# Patient Record
Sex: Female | Born: 1960
Health system: Southern US, Community
[De-identification: ages and names within clinical notes are randomized; demographics above are authoritative.]

## PROBLEM LIST (undated history)

## (undated) DIAGNOSIS — Z79810 Long term (current) use of selective estrogen receptor modulators (SERMs): Secondary | ICD-10-CM

## (undated) DIAGNOSIS — F32A Depression, unspecified: Secondary | ICD-10-CM

## (undated) DIAGNOSIS — D702 Other drug-induced agranulocytosis: Secondary | ICD-10-CM

## (undated) DIAGNOSIS — Z860100 Personal history of colon polyps, unspecified: Secondary | ICD-10-CM

## (undated) DIAGNOSIS — F419 Anxiety disorder, unspecified: Secondary | ICD-10-CM

## (undated) DIAGNOSIS — M722 Plantar fascial fibromatosis: Secondary | ICD-10-CM

## (undated) DIAGNOSIS — Z9221 Personal history of antineoplastic chemotherapy: Secondary | ICD-10-CM

## (undated) DIAGNOSIS — T8859XA Other complications of anesthesia, initial encounter: Secondary | ICD-10-CM

## (undated) DIAGNOSIS — R42 Dizziness and giddiness: Secondary | ICD-10-CM

## (undated) DIAGNOSIS — R05 Cough: Secondary | ICD-10-CM

## (undated) DIAGNOSIS — N189 Chronic kidney disease, unspecified: Secondary | ICD-10-CM

## (undated) DIAGNOSIS — C7949 Secondary malignant neoplasm of other parts of nervous system: Secondary | ICD-10-CM

## (undated) DIAGNOSIS — R059 Cough, unspecified: Secondary | ICD-10-CM

## (undated) DIAGNOSIS — K219 Gastro-esophageal reflux disease without esophagitis: Secondary | ICD-10-CM

## (undated) DIAGNOSIS — R351 Nocturia: Secondary | ICD-10-CM

## (undated) DIAGNOSIS — N951 Menopausal and female climacteric states: Secondary | ICD-10-CM

## (undated) DIAGNOSIS — G4700 Insomnia, unspecified: Secondary | ICD-10-CM

## (undated) DIAGNOSIS — R350 Frequency of micturition: Secondary | ICD-10-CM

## (undated) DIAGNOSIS — K589 Irritable bowel syndrome without diarrhea: Secondary | ICD-10-CM

## (undated) DIAGNOSIS — Z923 Personal history of irradiation: Secondary | ICD-10-CM

## (undated) DIAGNOSIS — C50919 Malignant neoplasm of unspecified site of unspecified female breast: Secondary | ICD-10-CM

## (undated) DIAGNOSIS — T4145XA Adverse effect of unspecified anesthetic, initial encounter: Secondary | ICD-10-CM

## (undated) DIAGNOSIS — F329 Major depressive disorder, single episode, unspecified: Secondary | ICD-10-CM

## (undated) DIAGNOSIS — Z8709 Personal history of other diseases of the respiratory system: Secondary | ICD-10-CM

## (undated) DIAGNOSIS — Z98811 Dental restoration status: Secondary | ICD-10-CM

## (undated) HISTORY — DX: Plantar fascial fibromatosis: M72.2

## (undated) HISTORY — DX: Malignant neoplasm of unspecified site of unspecified female breast: C50.919

## (undated) HISTORY — DX: Secondary malignant neoplasm of other parts of nervous system: C79.49

## (undated) HISTORY — DX: Other drug-induced agranulocytosis: D70.2

## (undated) HISTORY — DX: Menopausal and female climacteric states: N95.1

## (undated) HISTORY — PX: COLONOSCOPY: SHX174

## (undated) HISTORY — DX: Irritable bowel syndrome, unspecified: K58.9

---

## 1999-10-18 ENCOUNTER — Encounter: Admission: RE | Admit: 1999-10-18 | Discharge: 1999-10-18 | Payer: Self-pay | Admitting: Internal Medicine

## 1999-11-02 ENCOUNTER — Ambulatory Visit (HOSPITAL_COMMUNITY): Admission: RE | Admit: 1999-11-02 | Discharge: 1999-11-02 | Payer: Self-pay | Admitting: Family Medicine

## 1999-11-02 ENCOUNTER — Encounter: Payer: Self-pay | Admitting: Family Medicine

## 2000-02-01 ENCOUNTER — Encounter: Admission: RE | Admit: 2000-02-01 | Discharge: 2000-03-08 | Payer: Self-pay | Admitting: Family Medicine

## 2000-02-02 ENCOUNTER — Encounter: Admission: RE | Admit: 2000-02-02 | Discharge: 2000-02-02 | Payer: Self-pay | Admitting: Internal Medicine

## 2000-02-04 ENCOUNTER — Encounter: Payer: Self-pay | Admitting: Internal Medicine

## 2000-02-04 ENCOUNTER — Ambulatory Visit (HOSPITAL_COMMUNITY): Admission: RE | Admit: 2000-02-04 | Discharge: 2000-02-04 | Payer: Self-pay | Admitting: Internal Medicine

## 2000-02-14 ENCOUNTER — Encounter: Admission: RE | Admit: 2000-02-14 | Discharge: 2000-02-14 | Payer: Self-pay | Admitting: Internal Medicine

## 2000-03-06 ENCOUNTER — Encounter: Admission: RE | Admit: 2000-03-06 | Discharge: 2000-03-06 | Payer: Self-pay | Admitting: Internal Medicine

## 2004-11-25 ENCOUNTER — Ambulatory Visit: Payer: Self-pay

## 2004-12-10 ENCOUNTER — Ambulatory Visit: Payer: Self-pay

## 2005-06-13 ENCOUNTER — Ambulatory Visit: Payer: Self-pay

## 2005-12-22 ENCOUNTER — Ambulatory Visit: Payer: Self-pay | Admitting: Obstetrics and Gynecology

## 2006-03-21 HISTORY — PX: BREAST LUMPECTOMY: SHX2

## 2007-03-13 ENCOUNTER — Ambulatory Visit: Payer: Self-pay

## 2007-03-26 ENCOUNTER — Ambulatory Visit: Payer: Self-pay

## 2007-04-25 ENCOUNTER — Ambulatory Visit: Payer: Self-pay | Admitting: Surgery

## 2007-05-04 ENCOUNTER — Ambulatory Visit: Payer: Self-pay | Admitting: Surgery

## 2007-05-11 ENCOUNTER — Ambulatory Visit: Payer: Self-pay | Admitting: Surgery

## 2008-03-18 ENCOUNTER — Ambulatory Visit: Payer: Self-pay

## 2008-10-08 ENCOUNTER — Emergency Department (HOSPITAL_COMMUNITY): Admission: EM | Admit: 2008-10-08 | Discharge: 2008-10-08 | Payer: Self-pay | Admitting: Emergency Medicine

## 2009-12-01 ENCOUNTER — Ambulatory Visit: Payer: Self-pay | Admitting: Sports Medicine

## 2009-12-01 DIAGNOSIS — M2170 Unequal limb length (acquired), unspecified site: Secondary | ICD-10-CM | POA: Insufficient documentation

## 2009-12-01 DIAGNOSIS — M722 Plantar fascial fibromatosis: Secondary | ICD-10-CM | POA: Insufficient documentation

## 2009-12-01 DIAGNOSIS — M79609 Pain in unspecified limb: Secondary | ICD-10-CM | POA: Insufficient documentation

## 2010-04-20 NOTE — Assessment & Plan Note (Signed)
Summary: LEFT FOOT PAIN IN HEEL AND MID FOOT x 5 WEEKS   Vital Signs:  Patient profile:   50 year old female Height:      63 inches Weight:      150 pounds BMI:     26.67 BP sitting:   146 / 88   History of Present Illness: 5 weeks of left heel pain that radiates to 2-3 metatarsals.  Gradual onset, worse first in the morning.  No pain at rest and does not wake her from sleep.  Ibuprofen  ~800mg  helps as well.  No numbness or weakness.  No known injury; did resume a walking program prior to pain.    Allergies (verified): No Known Drug Allergies  Physical Exam  General:  Well-developed,well-nourished,in no acute distress; alert,appropriate and cooperative throughout examination Msk:  Left Leg and Foot - Left leg 1.5cm longer - Pes planus with external rotation of left foot - Prominant navicular vs right - Tender medial aspect left heel that worsens with extension of great toe - 3rd MT TTP - Distal n/v intact - Strength grossly 5/5  Gait: slightly antalgic with moderate pronation and external rotation of left foot that improved with orthotics   Impression & Recommendations:  Problem # 1:  UNEQUAL LEG LENGTH (ICD-736.81) Assessment New   Suspect unequal length is the trigger for foot pain and plantar fasciitis. Approx 1.5cm increase in left leg length corrected with temporary orthotics that were fitted and adjusted in the office today.  Plan to return in 4 weeks for custom orthotics.   Orders: Foot Orthosis ( Arch Strap/Heel Cup) [phone]) Sports Insoles [phone])  Problem # 2:  FOOT PAIN, LEFT (ICD-729.5) Assessment: New  2/2 unequal leg lenth.  Orthotics per above.  Home exercise program and arch band.  Ice and as needed NSAIDS advised.   Orders: Foot Orthosis ( Arch Strap/Heel Cup) [phone]) Sports Insoles [phone])  Problem # 3:  PLANTAR FASCIITIS, LEFT (ICD-728.71) Assessment: New  Plan per foot pain above.   Orders: Foot Orthosis ( Arch Strap/Heel Cup)  [phone]) Sports Insoles [phone])

## 2011-01-18 ENCOUNTER — Encounter: Payer: Self-pay | Admitting: Internal Medicine

## 2011-01-18 ENCOUNTER — Ambulatory Visit (INDEPENDENT_AMBULATORY_CARE_PROVIDER_SITE_OTHER): Payer: 59 | Admitting: Internal Medicine

## 2011-01-18 DIAGNOSIS — N920 Excessive and frequent menstruation with regular cycle: Secondary | ICD-10-CM

## 2011-01-18 DIAGNOSIS — M722 Plantar fascial fibromatosis: Secondary | ICD-10-CM | POA: Insufficient documentation

## 2011-01-18 DIAGNOSIS — K589 Irritable bowel syndrome without diarrhea: Secondary | ICD-10-CM | POA: Insufficient documentation

## 2011-01-18 DIAGNOSIS — K2100 Gastro-esophageal reflux disease with esophagitis, without bleeding: Secondary | ICD-10-CM

## 2011-01-18 DIAGNOSIS — Z13220 Encounter for screening for lipoid disorders: Secondary | ICD-10-CM

## 2011-01-18 DIAGNOSIS — R635 Abnormal weight gain: Secondary | ICD-10-CM

## 2011-01-18 DIAGNOSIS — Z1239 Encounter for other screening for malignant neoplasm of breast: Secondary | ICD-10-CM

## 2011-01-18 LAB — COMPREHENSIVE METABOLIC PANEL
AST: 20 U/L (ref 0–37)
Albumin: 4.1 g/dL (ref 3.5–5.2)
BUN: 14 mg/dL (ref 6–23)
Calcium: 9.6 mg/dL (ref 8.4–10.5)
Chloride: 102 mEq/L (ref 96–112)
Potassium: 4 mEq/L (ref 3.5–5.1)
Total Protein: 7.6 g/dL (ref 6.0–8.3)

## 2011-01-18 LAB — CBC WITH DIFFERENTIAL/PLATELET
Basophils Absolute: 0 10*3/uL (ref 0.0–0.1)
Basophils Relative: 0.5 % (ref 0.0–3.0)
Eosinophils Absolute: 0.1 10*3/uL (ref 0.0–0.7)
Lymphocytes Relative: 24.3 % (ref 12.0–46.0)
MCHC: 34 g/dL (ref 30.0–36.0)
Neutrophils Relative %: 69.3 % (ref 43.0–77.0)
Platelets: 305 10*3/uL (ref 150.0–400.0)
RBC: 4.48 Mil/uL (ref 3.87–5.11)
RDW: 12.4 % (ref 11.5–14.6)

## 2011-01-18 LAB — LDL CHOLESTEROL, DIRECT: Direct LDL: 140.6 mg/dL

## 2011-01-18 MED ORDER — HYOSCYAMINE SULFATE 0.125 MG SL SUBL: 0.1250 mg | SUBLINGUAL_TABLET | SUBLINGUAL | Status: AC | PRN

## 2011-01-18 MED ORDER — ESOMEPRAZOLE MAGNESIUM 40 MG PO CPDR: 40.0000 mg | DELAYED_RELEASE_CAPSULE | Freq: Every day | ORAL | Status: DC

## 2011-01-18 NOTE — Progress Notes (Signed)
  Subjective:    Patient ID: Mary [name], female    DOB: [birth year], 50 y.o.   MRN: [id number]  HPI  50 yo history of plantar fasciitis presents with heavy periods or the last 3 to 4 years and new onset hot flashes,  Periods accompaneid by a lot of cramping. .  Also having new onset reflux with regurgitation, has been taking pepcid which helps.    Throat burning.  Pain under left breast and left breast with dull ache that comes and goes for the past 2 months.   Last mammogram at [facility] 2010. In 2009 had a breast  biopsy of the left breast which was normal.    Saw Dr. Mechele Collin years ago for a colonoscopy during diagnosis of IBS.  No unintentional weight loss.Plantar fasciitis treated by Dr. Charlsie Merles last year in  Sept with pain that resolved in May attributed to bunions .  Her IBS is controlled with immodium.   Past Medical History  Diagnosis Date  . Plantar fasciitis   . Irritable bowel syndrome    No current outpatient prescriptions on file prior to visit.    Review of Systems  Constitutional: Negative for fever, chills and unexpected weight change.  HENT: Negative for hearing loss, ear pain, nosebleeds, congestion, sore throat, facial swelling, rhinorrhea, sneezing, mouth sores, trouble swallowing, neck pain, neck stiffness, voice change, postnasal drip, sinus pressure, tinnitus and ear discharge.   Eyes: Negative for pain, discharge, redness and visual disturbance.  Respiratory: Negative for cough, chest tightness, shortness of breath, wheezing and stridor.   Cardiovascular: Negative for chest pain, palpitations and leg swelling.  Musculoskeletal: Negative for myalgias and arthralgias.  Skin: Negative for color change and rash.  Neurological: Negative for dizziness, weakness, light-headedness and headaches.  Hematological: Negative for adenopathy.       Objective:   Physical Exam  Constitutional: She is oriented to person, place, and time. She appears well-developed and well-nourished.   HENT:  Mouth/Throat: Oropharynx is clear and moist.  Eyes: EOM are normal. Pupils are equal, round, and reactive to light. No scleral icterus.  Neck: Normal range of motion. Neck supple. No JVD present. No thyromegaly present.  Cardiovascular: Normal rate, regular rhythm, normal heart sounds and intact distal pulses.   Pulmonary/Chest: Effort normal and breath sounds normal.  Abdominal: Soft. Bowel sounds are normal. She exhibits no mass. There is no tenderness.  Musculoskeletal: Normal range of motion. She exhibits no edema.  Lymphadenopathy:    She has no cervical adenopathy.  Neurological: She is alert and oriented to person, place, and time.  Skin: Skin is warm and dry.  Psychiatric: She has a normal mood and affect.          Assessment & Plan:  Screening for cervical cancer:  Will return in 2013 for PAP  History of remote abnormalities with serial normals since then for at least 10 years GERD with dysphagia/regurgitation. Will start a PPI and antispasmodic (hyoscyamine).  If symptoms do not improve in 4 weeks, refer for modified barium swallow. Screening for breast cancer: mammogram is overdue and ordered today.

## 2011-01-19 ENCOUNTER — Encounter: Payer: Self-pay | Admitting: Internal Medicine

## 2011-01-28 ENCOUNTER — Other Ambulatory Visit (HOSPITAL_COMMUNITY): Payer: 59

## 2011-02-23 LAB — HM MAMMOGRAPHY: HM Mammogram: NORMAL

## 2011-03-16 ENCOUNTER — Ambulatory Visit: Payer: Self-pay | Admitting: Internal Medicine

## 2011-03-22 HISTORY — PX: MASTECTOMY: SHX3

## 2011-03-30 ENCOUNTER — Encounter: Payer: Self-pay | Admitting: Internal Medicine

## 2011-05-18 ENCOUNTER — Telehealth: Payer: Self-pay | Admitting: Internal Medicine

## 2011-05-18 NOTE — Telephone Encounter (Signed)
I talked to patient she made an appt for Friday.  She was advised to continue eating bland foods until then and discontinue the imodium because patient stated it wasn't working anyway.

## 2011-05-18 NOTE — Telephone Encounter (Signed)
[facility] Triage Call Report Triage Record Num: [id number] Operator: Chevis Pretty Patient Name: Mary [name] Call Date & Time: 05/18/2011 9:34:12AM Patient Phone: [phone] PCP: Duncan Dull Patient Gender: Female PCP Fax : [phone] Patient DOB: 02/15/1961 Practice Name: [facility] Reason for Call: LMP 05/14/11. Caller: [name]/Patient; PCP: Duncan Dull; CB#: [phone]; ; ; Call regarding Has Had Diarrhea and Immodium Not Helping; states onset 05/15/11 of very watery diarrhea, and immodium helped, but then the watery diarrhea restarted 05/17/11 PM. Afebrile. Per protocol, emergent symptoms denied; advised appt within 24 hours. No appts available in Epic; caller transferred to office nurse/Ashley for assistance. Protocol(s) Used: Diarrhea or Other Change in Bowel Habits Recommended Outcome per Protocol: See Provider within 24 hours Reason for Outcome: Diarrhea lasting longer than 24 hours AND not improving with home care Care Advice: ~ Maintain dietary recommendations for pre-existing conditions. Do not take aspirin, ibuprofen, ketoprofen, naproxen, etc., or other pain relieving medications until consulting with provider. ~ ~ Tell provider if you have traveled to an area where infections are common. ~ SYMPTOM / CONDITION MANAGEMENT ~ CAUTIONS Go to the ED if you have developed signs and symptoms of dehydration such as very dry mouth and tongue; increased pulse rate at rest; no urine output for 8 hours or more; increasing weakness or drowsiness, or lightheadedness when trying to sit upright or standing. ~ Diarrheal Care: - Drink 2-3 quarts (2-3 liters) per day of low sugar content fluids, including over the counter oral hydration solution, unless directed otherwise by provider. - If accompanied by vomiting, take the fluids in frequent small sips or suck on ice chips. - Eat easily digested foods (such as bananas, rice, applesauce, toast, cooked  cereals, soup, crackers, baked or boiled potato, or baked chicken or [place] without skin). - Do not eat high fiber, high fat, high sugar content foods, or highly seasoned foods. - Do not drink caffeinated or alcoholic beverages. - Avoid milk and milk products while having symptoms. As symptoms improve, gradually add back to diet. - Application of A&D ointment or witch hazel medicated pads may help anal irritation. - Antidiarrheal medications are usually unnecessary. If symptoms are severe, consider nonprescription antidiarrheal and anti-motility drugs as directed by label or a provider. Do not take if have high fever or bloody diarrhea. If pregnant, do not take any medications not approved by your provider. - Consult your provider for advice regarding continuing prescription medication. ~ 05/18/2011 9:58:53AM Page 1 of 1 [id number]

## 2011-05-20 ENCOUNTER — Ambulatory Visit (INDEPENDENT_AMBULATORY_CARE_PROVIDER_SITE_OTHER): Payer: 59 | Admitting: Internal Medicine

## 2011-05-20 ENCOUNTER — Other Ambulatory Visit: Payer: Self-pay | Admitting: Internal Medicine

## 2011-05-20 ENCOUNTER — Encounter: Payer: Self-pay | Admitting: Internal Medicine

## 2011-05-20 DIAGNOSIS — R197 Diarrhea, unspecified: Secondary | ICD-10-CM

## 2011-05-20 DIAGNOSIS — A09 Infectious gastroenteritis and colitis, unspecified: Secondary | ICD-10-CM

## 2011-05-20 LAB — CBC WITH DIFFERENTIAL/PLATELET
Basophils Absolute: 0 10*3/uL (ref 0.0–0.1)
Basophils Relative: 0.7 % (ref 0.0–3.0)
Eosinophils Absolute: 0.1 10*3/uL (ref 0.0–0.7)
Lymphocytes Relative: 25.9 % (ref 12.0–46.0)
MCHC: 34 g/dL (ref 30.0–36.0)
MCV: 94.8 fl (ref 78.0–100.0)
Monocytes Absolute: 0.4 10*3/uL (ref 0.1–1.0)
Neutrophils Relative %: 65.3 % (ref 43.0–77.0)
RBC: 4.33 Mil/uL (ref 3.87–5.11)
RDW: 12.6 % (ref 11.5–14.6)

## 2011-05-20 MED ORDER — ALPRAZOLAM 0.25 MG PO TABS: 0.2500 mg | ORAL_TABLET | Freq: Two times a day (BID) | ORAL | Status: AC | PRN

## 2011-05-20 MED ORDER — DIPHENOXYLATE-ATROPINE 2.5-0.025 MG/5ML PO LIQD: 5.0000 mL | Freq: Four times a day (QID) | ORAL | Status: AC | PRN

## 2011-05-20 NOTE — Patient Instructions (Signed)
Diet for Diarrhea, Adult Having frequent, runny stools (diarrhea) has many causes. Diarrhea may be caused or worsened by food or drink. Diarrhea may be relieved by changing your diet. IF YOU ARE NOT TOLERATING SOLID FOODS:  Drink enough water and fluids to keep your urine clear or pale yellow.     Avoid sugary drinks and sodas as well as milk-based beverages.     Avoid beverages containing caffeine and alcohol.     You may try rehydrating beverages. You can make your own by following this recipe:      tsp table salt.      tsp baking soda.     ? tsp salt substitute (potassium chloride).     1 tbs + 1 tsp sugar.     1 qt water.  As your stools become more solid, you can start eating solid foods. Add foods one at a time. If a certain food causes your diarrhea to get worse, avoid that food and try other foods. A low fiber, low-fat, and lactose-free diet is recommended. Small, frequent meals may be better tolerated.   Starches  Allowed:  White, [place], and pita breads, plain rolls, buns, bagels. Plain muffins, matzo. Soda, saltine, or graham crackers. Pretzels, melba toast, zwieback. Cooked cereals made with water: cornmeal, farina, cream cereals. Dry cereals: refined corn, wheat, rice. Potatoes prepared any way without skins, refined macaroni, spaghetti, noodles, refined rice.     Avoid:  Bread, rolls, or crackers made with whole wheat, multi-grains, rye, bran seeds, nuts, or coconut. Corn tortillas or taco shells. Cereals containing whole grains, multi-grains, bran, coconut, nuts, or raisins. Cooked or dry oatmeal. Coarse wheat cereals, granola. Cereals advertised as "high-fiber." Potato skins. Whole grain pasta, wild or brown rice. Popcorn. Sweet potatoes/yams. Sweet rolls, doughnuts, waffles, pancakes, sweet breads.  Vegetables  Allowed: Strained tomato and vegetable juices. Most well-cooked and canned vegetables without seeds. Fresh: Tender lettuce, cucumber without the skin, cabbage,  spinach, bean sprouts.     Avoid: Fresh, cooked, or canned: Artichokes, baked beans, beet greens, broccoli, Brussels sprouts, corn, kale, legumes, peas, sweet potatoes. Cooked: Green or red cabbage, spinach. Avoid large servings of any vegetables, because vegetables shrink when cooked, and they contain more fiber per serving than fresh vegetables.  Fruit  Allowed: All fruit juices except prune juice. Cooked or canned: Apricots, applesauce, cantaloupe, cherries, fruit cocktail, grapefruit, grapes, kiwi, mandarin oranges, peaches, pears, plums, watermelon. Fresh: Apples without skin, ripe banana, grapes, cantaloupe, cherries, grapefruit, peaches, oranges, plums. Keep servings limited to  cup or 1 piece.     Avoid: Fresh: Apple with skin, apricots, mango, pears, raspberries, strawberries. Prune juice, stewed or dried prunes. Dried fruits, raisins, dates. Large servings of all fresh fruits.  Meat and Meat Substitutes  Allowed: Ground or well-cooked tender beef, ham, veal, lamb, pork, or poultry. Eggs, plain cheese. Fish, oysters, shrimp, lobster, other seafoods. Liver, organ meats.     Avoid: Tough, fibrous meats with gristle. Peanut butter, smooth or chunky. Cheese, nuts, seeds, legumes, dried peas, beans, lentils.  Milk  Allowed: Yogurt, lactose-free milk, kefir, drinkable yogurt, buttermilk, soy milk.     Avoid: Milk, chocolate milk, beverages made with milk, such as milk shakes.  Soups  Allowed: Bouillon, broth, or soups made from allowed foods. Any strained soup.     Avoid: Soups made from vegetables that are not allowed, cream or milk-based soups.  Desserts and Sweets  Allowed: Sugar-free gelatin, sugar-free frozen ice pops made without sugar alcohol.  Avoid: Plain cakes and cookies, pie made with allowed fruit, pudding, custard, cream pie. Gelatin, fruit, ice, sherbet, frozen ice pops. Ice cream, ice milk without nuts. Plain hard candy, honey, jelly, molasses, syrup, sugar,  chocolate syrup, gumdrops, marshmallows.  Fats and Oils  Allowed: Avoid any fats and oils.     Avoid: Seeds, nuts, olives, avocados. Margarine, butter, cream, mayonnaise, salad oils, plain salad dressings made from allowed foods. Plain gravy, crisp bacon without rind.  Beverages  Allowed: Water, decaffeinated teas, oral rehydration solutions, sugar-free beverages.     Avoid: Fruit juices, caffeinated beverages (coffee, tea, soda or pop), alcohol, sports drinks, or lemon-lime soda or pop.  Condiments  Allowed: Ketchup, mustard, horseradish, vinegar, cream sauce, cheese sauce, cocoa powder. Spices in moderation: allspice, basil, bay leaves, celery powder or leaves, cinnamon, cumin powder, curry powder, ginger, mace, marjoram, onion or garlic powder, oregano, paprika, parsley flakes, ground pepper, rosemary, sage, savory, tarragon, thyme, turmeric.     Avoid: Coconut, honey.  Weight Monitoring: Weigh yourself every day. You should weigh yourself in the morning after you urinate and before you eat breakfast. Wear the same amount of clothing when you weigh yourself. Record your weight daily. Bring your recorded weights to your clinic visits. Tell your caregiver right away if you have gained 3 lb/1.4 kg or more in 1 day, 5 lb/2.3 kg in a week, or whatever amount you were told to report. SEEK IMMEDIATE MEDICAL CARE IF:    You are unable to keep fluids down.     You start to throw up (vomit) or diarrhea keeps coming back (persistent).     Abdominal pain develops, increases, or can be felt in one place (localizes).     You have an oral temperature above 102 F (38.9 C), not controlled by medicine.     Diarrhea contains blood or mucus.     You develop excessive weakness, dizziness, fainting, or extreme thirst.  MAKE SURE YOU:    Understand these instructions.     Will watch your condition.     Will get help right away if you are not doing well or get worse.  Document Released: 05/28/2003  Document Revised: 11/17/2010 Document Reviewed: 09/18/2008

## 2011-05-20 NOTE — Progress Notes (Signed)
Subjective:    Patient ID: Mary [name], female    DOB: [birth year], 51 y.o.   MRN: [id number]  HPI  Mary [name] is a 51 yr old white female with a history of IBS who presents with a 6 day history of diarrhea. 6 to 8 loose stools daily , mild nausea occasionally,  No vomiting or abdominal pian.  No blood in stools.  No recent travel to endemic areas or recent restaurant use.   Symptoms have transiently improved with dieatary restrictions but she has hafd recurrence since advancing her diet beyond clear liquids.  6 office coworkers were also reporting loose stools for the pat several days. 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  Past Medical History  Diagnosis Date  . Plantar fasciitis   . Irritable bowel syndrome    Current Outpatient Prescriptions on File Prior to Visit  Medication Sig Dispense Refill  . Ascorbic Acid (VITAMIN C) 100 MG tablet Take 100 mg by mouth daily.        Marland Kitchen b complex vitamins tablet Take 1 tablet by mouth daily.       . calcium carbonate (OS-CAL) 600 MG TABS Take 600 mg by mouth 2 (two) times daily with a meal.        . esomeprazole (NEXIUM) 40 MG capsule Take 1 capsule (40 mg total) by mouth daily.  30 capsule  1  . Multiple Vitamin (MULTIVITAMIN) tablet Take 1 tablet by mouth daily.        . vitamin B-12 (CYANOCOBALAMIN) 100 MCG tablet Take 50 mcg by mouth daily.         Review of Systems  Constitutional: Positive for fatigue and unexpected weight change. Negative for fever and chills.  HENT: Negative for hearing loss, ear pain, nosebleeds, congestion, sore throat, facial swelling, rhinorrhea, sneezing, mouth sores, trouble swallowing, neck pain, neck stiffness, voice change, postnasal drip, sinus pressure, tinnitus and ear discharge.   Eyes: Negative for pain, discharge, redness and visual disturbance.  Respiratory: Negative for cough, chest tightness, shortness of  breath, wheezing and stridor.   Cardiovascular: Negative for chest pain, palpitations and leg swelling.  Gastrointestinal: Positive for diarrhea. Negative for nausea, vomiting, abdominal pain, constipation, blood in stool, abdominal distention, anal bleeding and rectal pain.  Musculoskeletal: Negative for myalgias and arthralgias.  Skin: Negative for color change and rash.  Neurological: Negative for dizziness, weakness, light-headedness and headaches.  Hematological: Negative for adenopathy.       Objective:   Physical Exam  Constitutional: She is oriented to person, place, and time. She appears well-developed and well-nourished.  HENT:  Mouth/Throat: Oropharynx is clear and moist.  Eyes: EOM are normal. Pupils are equal, round, and reactive to light. No scleral icterus.  Neck: Normal range of motion. Neck supple. No JVD present. No thyromegaly present.  Cardiovascular: Normal rate, regular rhythm, normal heart sounds and intact distal pulses.   Pulmonary/Chest: Effort normal and breath sounds normal.  Abdominal: Soft. Bowel sounds are normal. She exhibits no mass. There is no tenderness.  Musculoskeletal: Normal range of motion. She exhibits no edema.  Lymphadenopathy:    She has no cervical adenopathy.  Neurological: She is alert and oriented to person, place, and time.  Skin: Skin is warm and dry.  Psychiatric: She has a normal mood and affect.      Assessment & Plan:   Diarrhea of presumed infectious origin I suspect her symptoms were  due to viral etiology, e.g., [place] virus, and are largely resolved.  However she is having a difficult time advancing her diet.  Will check stools for continued infection,  Recommend clear liquid diet for a few days and cautious advancement avoiding dairy and roughage.  Teacher, English as a foreign language given.  Electrolytes and CBC were all normal.     Updated Medication List Outpatient Encounter Prescriptions as of 05/20/2011  Medication Sig Dispense Refill    . Ascorbic Acid (VITAMIN C) 100 MG tablet Take 100 mg by mouth daily.        Marland Kitchen b complex vitamins tablet Take 1 tablet by mouth daily.       . calcium carbonate (OS-CAL) 600 MG TABS Take 600 mg by mouth 2 (two) times daily with a meal.        . esomeprazole (NEXIUM) 40 MG capsule Take 1 capsule (40 mg total) by mouth daily.  30 capsule  1  . Multiple Vitamin (MULTIVITAMIN) tablet Take 1 tablet by mouth daily.        . vitamin B-12 (CYANOCOBALAMIN) 100 MCG tablet Take 50 mcg by mouth daily.        Marland Kitchen ALPRAZolam (XANAX) 0.25 MG tablet Take 1 tablet (0.25 mg total) by mouth 2 (two) times daily as needed for anxiety.  60 tablet  2  . diphenoxylate-atropine (LOMOTIL) 2.5-0.025 MG/5ML liquid Take 5 mLs by mouth 4 (four) times daily as needed.  60 mL  0

## 2011-05-21 LAB — COMPLETE METABOLIC PANEL WITH GFR
ALT: 29 U/L (ref 0–35)
Albumin: 4 g/dL (ref 3.5–5.2)
CO2: 22 mEq/L (ref 19–32)
Calcium: 8.6 mg/dL (ref 8.4–10.5)
Chloride: 108 mEq/L (ref 96–112)
GFR, Est African American: >89 mL/min
GFR, Est Non African American: 84 mL/min
Glucose, Bld: 75 mg/dL (ref 70–99)
Potassium: 5.2 mEq/L (ref 3.5–5.3)
Sodium: 140 mEq/L (ref 135–145)
Total Bilirubin: 0.3 mg/dL (ref 0.3–1.2)
Total Protein: 6.8 g/dL (ref 6.0–8.3)

## 2011-05-22 ENCOUNTER — Encounter: Payer: Self-pay | Admitting: Internal Medicine

## 2011-05-22 DIAGNOSIS — R197 Diarrhea, unspecified: Secondary | ICD-10-CM | POA: Insufficient documentation

## 2011-05-22 NOTE — Assessment & Plan Note (Signed)
I suspect her symptoms were due to viral etiology, e.g., [place] virus, and are largely resolved.  However she is having a difficult time advancing her diet.  Will check stools for continued infection,  Recommend clear liquid diet for a few days and cautious advancement avoiding dairy and roughage.  Teacher, English as a foreign language given.  Electrolytes and CBC were all normal.

## 2011-05-24 LAB — OVA AND PARASITE SCREEN

## 2011-05-24 LAB — FECAL LACTOFERRIN, QUANT: Lactoferrin: NEGATIVE

## 2011-09-13 ENCOUNTER — Telehealth: Payer: Self-pay | Admitting: Internal Medicine

## 2011-09-13 NOTE — Telephone Encounter (Signed)
Have her start otc zyrtec at bedtime one daily.  Follow up in one week

## 2011-09-13 NOTE — Telephone Encounter (Signed)
Caller: Syan/Patient; PCP: Duncan Dull; CB#: [phone];  Call regarding Cough/Congestion; Onset 09/07/11, worsening in spite of Mucinex OTC.  LMP 08/25/11.  Dry cough, worse at night.  Afebrile/subjective.  Clear sputum and nasal secretions- cough interferes w/ sleep.  Emergent sx ruled out.  See in 24 hours per Cough protocol.  Caller agreed.  Home care for the interim.  No appointments available at this time.  Message to office for follow up per practice instructions.  Caller advised she will be contacted regarding appointment ASAP.

## 2011-09-13 NOTE — Telephone Encounter (Signed)
Patient notified, she made an appt for next week.

## 2011-09-14 ENCOUNTER — Ambulatory Visit: Payer: 59 | Admitting: Internal Medicine

## 2011-09-21 ENCOUNTER — Ambulatory Visit (INDEPENDENT_AMBULATORY_CARE_PROVIDER_SITE_OTHER): Payer: 59 | Admitting: Internal Medicine

## 2011-09-21 ENCOUNTER — Encounter: Payer: Self-pay | Admitting: Internal Medicine

## 2011-09-21 VITALS — BP 150/84 | HR 88 | Temp 98.8°F | Resp 16 | Wt 162.8 lb

## 2011-09-21 DIAGNOSIS — N6310 Unspecified lump in the right breast, unspecified quadrant: Secondary | ICD-10-CM

## 2011-09-21 DIAGNOSIS — J40 Bronchitis, not specified as acute or chronic: Secondary | ICD-10-CM

## 2011-09-21 DIAGNOSIS — N63 Unspecified lump in unspecified breast: Secondary | ICD-10-CM

## 2011-09-21 DIAGNOSIS — J069 Acute upper respiratory infection, unspecified: Secondary | ICD-10-CM

## 2011-09-21 DIAGNOSIS — R030 Elevated blood-pressure reading, without diagnosis of hypertension: Secondary | ICD-10-CM

## 2011-09-21 MED ORDER — PREDNISONE (PAK) 10 MG PO TABS: ORAL_TABLET | ORAL | Status: AC

## 2011-09-21 MED ORDER — AZITHROMYCIN 500 MG PO TABS: 500.0000 mg | ORAL_TABLET | Freq: Every day | ORAL | Status: DC

## 2011-09-21 MED ORDER — GUAIFENESIN-CODEINE 100-10 MG/5ML PO SYRP: 10.0000 mL | ORAL_SOLUTION | Freq: Two times a day (BID) | ORAL | Status: AC | PRN

## 2011-09-21 NOTE — Patient Instructions (Addendum)
The post nasal drip is causing your sore throat.  Lavage your sinuses twice daly with Simply saline nasal spray.  Gargle with salt water often for the sore throat.  I am calling in Cheratussin cough syrup (has codeine) for the cough, azithrmoycin for 7 days,  And prednisone to manage the inflammation    I am setting you up for a mammogram and ultrasound of your right breast

## 2011-09-21 NOTE — Progress Notes (Signed)
Patient ID: Mary [name], female   DOB: Oct 19, 1960, 51 y.o.   MRN: [id number]  Patient Active Problem List  Diagnosis  . PLANTAR FASCIITIS, LEFT  . FOOT PAIN, LEFT  . UNEQUAL LEG LENGTH  . Plantar fasciitis  . Irritable bowel syndrome  . Diarrhea of presumed infectious origin  . Elevated blood-pressure reading without diagnosis of hypertension  . Mass of breast, right  . URI (upper respiratory infection)    Subjective:  CC:   Chief Complaint  Patient presents with  . Cough    x 2 weeks    HPI:   Mary [name] a 51 y.o. female who presents with 2 symptoms. 1) Cough x 2 weeks productive of clear sputum, accompanied by Post nasal drainage. Several sick contacts, as the office has been passing it around for weeks.  No fevers,  No aches,  No headaches. No ear pain.   Clear nasal dischare.  Taking zyrtec daily and robitussin.  The cough is  waking her and husband up at night.   Other issue is right breast thickening. For the past 3 weeks she had noted a persistent mass to the medial side of the nipple accompanied by nipple starting to invert,  Breast is tender, including the nipple without discharge.  Left breast is not tender.    Past Medical History  Diagnosis Date  . Plantar fasciitis   . Irritable bowel syndrome     Past Surgical History  Procedure Date  . Breast surgery          The following portions of the patient's history were reviewed and updated as appropriate: Allergies, current medications, and problem list.    Review of Systems:   12 Pt  review of systems was negative except those addressed in the HPI,     History   Social History  . Marital Status: Married    Spouse Name: N/A    Number of Children: N/A  . Years of Education: N/A   Occupational History  . Not on file.   Social History Main Topics  . Smoking status: Former Smoker    Quit date: 01/18/1991  . Smokeless tobacco: Never Used  . Alcohol Use: 3.0 oz/week    5 Glasses of  wine per week  . Drug Use: No  . Sexually Active: Not on file   Other Topics Concern  . Not on file   Social History Narrative  . No narrative on file    Objective:  BP 150/84  Pulse 88  Temp 98.8 F (37.1 C) (Oral)  Resp 16  Wt 162 lb 12 oz (73.823 kg)  SpO2 97%  LMP 08/26/2011  General appearance: alert, cooperative and appears stated age Ears: normal TM's and external ear canals both ears Throat: lips, mucosa, and tongue normal; teeth and gums normal Neck: no adenopathy, no carotid bruit, supple, symmetrical, trachea midline and thyroid not enlarged, symmetric, no tenderness/mass/nodules Back: symmetric, no curvature. ROM normal. No CVA tenderness. Lungs: clear to auscultation bilaterally Heart: regular rate and rhythm, S1, S2 normal, no murmur, click, rub or gallop Breast: left breast with palpable mass medial to nippple, nipple inversion ( early) noted  Skin: Skin color, texture, turgor normal. No rashes or lesions Lymph nodes: Cervical, supraclavicular, and axillary nodes normal.  Assessment and Plan:  Elevated blood-pressure reading without diagnosis of hypertension likely secondary to anxiety re breast mass, and recent use of decongestants.  Will recheck in 3 months.   Mass of breast, right Found by patient  3 weeks ago,.  Appreciated one exam today with nipple flattening .  Last Mammogram Dec 2012 at [place].  Diagnostic and ultrasound ordered for September 27 2011   URI (upper respiratory infection) This URI is most likely viral given the persistence of mild HEENT symptoms.  I have explained that in viral URIS, an antibiotic will not help the symptoms and will increase the risk of developing diarrhea.,  Continue oral and nasal decongestants,  Ibuprofen 400 mg and tylenol 650 mq 8 hrs for aches and pains, p  And will add cheratussin with codeine and prednisone for cough  And add a round of abx only if symptoms worsen to include fevers, facial pain, purulent  sputum./drainage.     Updated Medication List Outpatient Encounter Prescriptions as of 09/21/2011  Medication Sig Dispense Refill  . Ascorbic Acid (VITAMIN C) 100 MG tablet Take 100 mg by mouth daily.        Marland Kitchen b complex vitamins tablet Take 1 tablet by mouth daily.       . calcium carbonate (OS-CAL) 600 MG TABS Take 600 mg by mouth 2 (two) times daily with a meal.        . esomeprazole (NEXIUM) 40 MG capsule Take 1 capsule (40 mg total) by mouth daily.  30 capsule  1  . Multiple Vitamin (MULTIVITAMIN) tablet Take 1 tablet by mouth daily.        . vitamin B-12 (CYANOCOBALAMIN) 100 MCG tablet Take 50 mcg by mouth daily.        Marland Kitchen azithromycin (ZITHROMAX) 500 MG tablet Take 1 tablet (500 mg total) by mouth daily.  7 tablet  0  . guaiFENesin-codeine (ROBITUSSIN AC) 100-10 MG/5ML syrup Take 10 mLs by mouth 2 (two) times daily as needed for cough.  240 mL  0  . predniSONE (STERAPRED UNI-PAK) 10 MG tablet .6 tablets on Day 1 , then reduce by 1 tablet daily until gone  21 tablet  0     Orders Placed This Encounter  Procedures  . MM Digital Diagnostic Unilat R  . HM MAMMOGRAPHY  . HM PAP SMEAR  . HM COLONOSCOPY    No Follow-up on file.

## 2011-09-23 DIAGNOSIS — I10 Essential (primary) hypertension: Secondary | ICD-10-CM | POA: Insufficient documentation

## 2011-09-23 DIAGNOSIS — J069 Acute upper respiratory infection, unspecified: Secondary | ICD-10-CM | POA: Insufficient documentation

## 2011-09-23 DIAGNOSIS — N6310 Unspecified lump in the right breast, unspecified quadrant: Secondary | ICD-10-CM | POA: Insufficient documentation

## 2011-09-23 NOTE — Assessment & Plan Note (Signed)
Found by patient 3 weeks ago,.  Appreciated one exam today with nipple flattening .  Last Mammogram Dec 2012 at [place].  Diagnostic and ultrasound ordered for September 27 2011

## 2011-09-23 NOTE — Assessment & Plan Note (Signed)
This URI is most likely viral given the persistence of mild HEENT symptoms.  I have explained that in viral URIS, an antibiotic will not help the symptoms and will increase the risk of developing diarrhea.,  Continue oral and nasal decongestants,  Ibuprofen 400 mg and tylenol 650 mq 8 hrs for aches and pains, p  And will add cheratussin with codeine and prednisone for cough  And add a round of abx only if symptoms worsen to include fevers, facial pain, purulent sputum./drainage.

## 2011-09-23 NOTE — Assessment & Plan Note (Addendum)
likely secondary to anxiety re breast mass, and recent use of decongestants.  Will recheck in 3 months.

## 2011-09-27 ENCOUNTER — Ambulatory Visit: Payer: Self-pay | Admitting: Internal Medicine

## 2011-09-28 ENCOUNTER — Telehealth: Payer: Self-pay | Admitting: Internal Medicine

## 2011-09-28 NOTE — Telephone Encounter (Signed)
Left voicemail on mobile number re Mammogram and ultrasound of right breast were normal.  Patient offered diuretic , instructed to call back if requested.

## 2011-09-29 ENCOUNTER — Telehealth: Payer: Self-pay | Admitting: Internal Medicine

## 2011-09-29 NOTE — Telephone Encounter (Signed)
Spironolactone 25 mg one tablet daily in the morning prn fluid retention. #30 2 refills

## 2011-09-29 NOTE — Telephone Encounter (Signed)
Patient called back and stated she would like the diuretic you mentioned when you called her with the mammo results.  Please advise.

## 2011-09-30 MED ORDER — SPIRONOLACTONE 25 MG PO TABS: 25.0000 mg | ORAL_TABLET | Freq: Every day | ORAL | Status: DC

## 2011-09-30 NOTE — Telephone Encounter (Signed)
Rx called in 

## 2011-10-11 ENCOUNTER — Encounter: Payer: Self-pay | Admitting: Internal Medicine

## 2011-10-17 ENCOUNTER — Other Ambulatory Visit: Payer: Self-pay | Admitting: Internal Medicine

## 2011-10-20 DIAGNOSIS — C50919 Malignant neoplasm of unspecified site of unspecified female breast: Secondary | ICD-10-CM

## 2011-10-20 HISTORY — DX: Malignant neoplasm of unspecified site of unspecified female breast: C50.919

## 2011-11-04 ENCOUNTER — Telehealth: Payer: Self-pay | Admitting: Internal Medicine

## 2011-11-04 DIAGNOSIS — N6453 Retraction of nipple: Secondary | ICD-10-CM

## 2011-11-04 DIAGNOSIS — N644 Mastodynia: Secondary | ICD-10-CM

## 2011-11-04 NOTE — Telephone Encounter (Signed)
Patient called and stated she is still having trouble with her right breast, her mammogram was normal and all other tests she had have came back normal.  She wanted to know if you could send her to a breast specialist because she feels something is just not right.  Please advise.

## 2011-11-04 NOTE — Telephone Encounter (Signed)
Patient notified

## 2011-11-04 NOTE — Telephone Encounter (Signed)
Referral to Breast Clinic ordered.

## 2011-11-08 ENCOUNTER — Other Ambulatory Visit: Payer: Self-pay | Admitting: Internal Medicine

## 2011-11-08 DIAGNOSIS — N6459 Other signs and symptoms in breast: Secondary | ICD-10-CM

## 2011-11-14 ENCOUNTER — Ambulatory Visit
Admission: RE | Admit: 2011-11-14 | Discharge: 2011-11-14 | Disposition: A | Discharge status: Home / Self Care | Payer: 59 | Source: Ambulatory Visit | Attending: Internal Medicine | Admitting: Internal Medicine

## 2011-11-14 ENCOUNTER — Other Ambulatory Visit: Payer: Self-pay | Admitting: Internal Medicine

## 2011-11-14 ENCOUNTER — Telehealth: Payer: Self-pay | Admitting: Internal Medicine

## 2011-11-14 ENCOUNTER — Ambulatory Visit (HOSPITAL_COMMUNITY): Payer: 59

## 2011-11-14 DIAGNOSIS — N6459 Other signs and symptoms in breast: Secondary | ICD-10-CM

## 2011-11-14 NOTE — Telephone Encounter (Signed)
Dr. Deboraha Sprang from [facility] in [place] called and stated they did an ultrasound and mammogram on patient today and she did find a highly suspicious mass in the right breast.  She stated she did a needle biopsy and should have the results back tomorrow.

## 2011-11-14 NOTE — Telephone Encounter (Signed)
Called patient to check on status,  She is returning to Dr. Deboraha Sprang at the [facility]  for results of right breast biopsy tomorrow at 4 PM.  She has alprazolam for mgmt of anxiety and advised to take ibuprofen tonight.

## 2011-11-15 ENCOUNTER — Ambulatory Visit
Admission: RE | Admit: 2011-11-15 | Discharge: 2011-11-15 | Disposition: A | Discharge status: Home / Self Care | Payer: 59 | Source: Ambulatory Visit | Attending: Internal Medicine | Admitting: Internal Medicine

## 2011-11-15 ENCOUNTER — Other Ambulatory Visit: Payer: Self-pay | Admitting: Internal Medicine

## 2011-11-15 ENCOUNTER — Other Ambulatory Visit: Payer: Self-pay | Admitting: *Deleted

## 2011-11-15 DIAGNOSIS — C50911 Malignant neoplasm of unspecified site of right female breast: Secondary | ICD-10-CM

## 2011-11-15 DIAGNOSIS — N6489 Other specified disorders of breast: Secondary | ICD-10-CM

## 2011-11-15 DIAGNOSIS — N6459 Other signs and symptoms in breast: Secondary | ICD-10-CM

## 2011-11-15 NOTE — Telephone Encounter (Signed)
Opened in error

## 2011-11-16 ENCOUNTER — Other Ambulatory Visit: Payer: Self-pay | Admitting: Internal Medicine

## 2011-11-16 ENCOUNTER — Ambulatory Visit
Admission: RE | Admit: 2011-11-16 | Discharge: 2011-11-16 | Disposition: A | Discharge status: Home / Self Care | Payer: 59 | Source: Ambulatory Visit | Attending: Internal Medicine | Admitting: Internal Medicine

## 2011-11-16 DIAGNOSIS — N63 Unspecified lump in unspecified breast: Secondary | ICD-10-CM

## 2011-11-17 ENCOUNTER — Ambulatory Visit
Admission: RE | Admit: 2011-11-17 | Discharge: 2011-11-17 | Disposition: A | Discharge status: Home / Self Care | Payer: 59 | Source: Ambulatory Visit | Attending: Internal Medicine | Admitting: Internal Medicine

## 2011-11-17 ENCOUNTER — Telehealth: Payer: Self-pay | Admitting: *Deleted

## 2011-11-17 ENCOUNTER — Other Ambulatory Visit: Payer: Self-pay | Admitting: *Deleted

## 2011-11-17 ENCOUNTER — Encounter: Payer: Self-pay | Admitting: Internal Medicine

## 2011-11-17 DIAGNOSIS — C50919 Malignant neoplasm of unspecified site of unspecified female breast: Secondary | ICD-10-CM | POA: Insufficient documentation

## 2011-11-17 DIAGNOSIS — N63 Unspecified lump in unspecified breast: Secondary | ICD-10-CM

## 2011-11-17 DIAGNOSIS — C50419 Malignant neoplasm of upper-outer quadrant of unspecified female breast: Secondary | ICD-10-CM

## 2011-11-17 DIAGNOSIS — C50412 Malignant neoplasm of upper-outer quadrant of left female breast: Secondary | ICD-10-CM | POA: Insufficient documentation

## 2011-11-17 NOTE — Telephone Encounter (Signed)
Confirmed [facility] for 11/23/11 at 1230 .  Instructions and contact information given.

## 2011-11-18 ENCOUNTER — Ambulatory Visit (HOSPITAL_COMMUNITY)
Admission: RE | Admit: 2011-11-18 | Discharge: 2011-11-18 | Disposition: A | Discharge status: Home / Self Care | Payer: 59 | Source: Ambulatory Visit | Attending: Internal Medicine | Admitting: Internal Medicine

## 2011-11-18 DIAGNOSIS — C50911 Malignant neoplasm of unspecified site of right female breast: Secondary | ICD-10-CM

## 2011-11-18 DIAGNOSIS — K7689 Other specified diseases of liver: Secondary | ICD-10-CM | POA: Insufficient documentation

## 2011-11-18 DIAGNOSIS — C50419 Malignant neoplasm of upper-outer quadrant of unspecified female breast: Secondary | ICD-10-CM | POA: Insufficient documentation

## 2011-11-18 DIAGNOSIS — D0590 Unspecified type of carcinoma in situ of unspecified breast: Secondary | ICD-10-CM | POA: Insufficient documentation

## 2011-11-18 MED ORDER — GADOBENATE DIMEGLUMINE 529 MG/ML IV SOLN
15.0000 mL | Freq: Once | INTRAVENOUS | Status: AC | PRN
  Administered 2011-11-18: 15 mL via INTRAVENOUS

## 2011-11-23 ENCOUNTER — Ambulatory Visit (HOSPITAL_BASED_OUTPATIENT_CLINIC_OR_DEPARTMENT_OTHER): Payer: 59 | Admitting: Oncology

## 2011-11-23 ENCOUNTER — Encounter: Payer: Self-pay | Admitting: Oncology

## 2011-11-23 ENCOUNTER — Telehealth: Payer: Self-pay | Admitting: *Deleted

## 2011-11-23 ENCOUNTER — Ambulatory Visit (HOSPITAL_BASED_OUTPATIENT_CLINIC_OR_DEPARTMENT_OTHER): Payer: Commercial Managed Care - PPO | Admitting: General Surgery

## 2011-11-23 ENCOUNTER — Ambulatory Visit: Payer: 59 | Attending: General Surgery | Admitting: Physical Therapy

## 2011-11-23 ENCOUNTER — Encounter: Payer: Self-pay | Admitting: Specialist

## 2011-11-23 ENCOUNTER — Ambulatory Visit (HOSPITAL_BASED_OUTPATIENT_CLINIC_OR_DEPARTMENT_OTHER): Payer: 59 | Admitting: Lab

## 2011-11-23 ENCOUNTER — Ambulatory Visit: Payer: 59

## 2011-11-23 ENCOUNTER — Encounter (INDEPENDENT_AMBULATORY_CARE_PROVIDER_SITE_OTHER): Payer: Self-pay | Admitting: General Surgery

## 2011-11-23 ENCOUNTER — Other Ambulatory Visit (INDEPENDENT_AMBULATORY_CARE_PROVIDER_SITE_OTHER): Payer: Self-pay | Admitting: General Surgery

## 2011-11-23 ENCOUNTER — Ambulatory Visit
Admission: RE | Admit: 2011-11-23 | Discharge: 2011-11-23 | Disposition: A | Discharge status: Home / Self Care | Payer: 59 | Source: Ambulatory Visit | Attending: Radiation Oncology | Admitting: Radiation Oncology

## 2011-11-23 VITALS — BP 152/93 | HR 75 | Temp 97.9°F | Resp 20 | Ht 63.2 in | Wt 162.0 lb

## 2011-11-23 DIAGNOSIS — C50419 Malignant neoplasm of upper-outer quadrant of unspecified female breast: Secondary | ICD-10-CM

## 2011-11-23 DIAGNOSIS — M25619 Stiffness of unspecified shoulder, not elsewhere classified: Secondary | ICD-10-CM | POA: Insufficient documentation

## 2011-11-23 DIAGNOSIS — IMO0001 Reserved for inherently not codable concepts without codable children: Secondary | ICD-10-CM | POA: Insufficient documentation

## 2011-11-23 DIAGNOSIS — C50919 Malignant neoplasm of unspecified site of unspecified female breast: Secondary | ICD-10-CM | POA: Insufficient documentation

## 2011-11-23 LAB — CBC WITH DIFFERENTIAL/PLATELET
BASO%: 0.3 % (ref 0.0–2.0)
Basophils Absolute: 0 10*3/uL (ref 0.0–0.1)
EOS%: 1 % (ref 0.0–7.0)
HGB: 13.8 g/dL (ref 11.6–15.9)
MCH: 31.9 pg (ref 25.1–34.0)
MCHC: 34.2 g/dL (ref 31.5–36.0)
MCV: 93.1 fL (ref 79.5–101.0)
MONO%: 6.6 % (ref 0.0–14.0)
RBC: 4.33 10*6/uL (ref 3.70–5.45)
RDW: 12.1 % (ref 11.2–14.5)
lymph#: 1.4 10*3/uL (ref 0.9–3.3)

## 2011-11-23 LAB — COMPREHENSIVE METABOLIC PANEL (CC13)
ALT: 19 U/L (ref 0–55)
AST: 16 U/L (ref 5–34)
Albumin: 3.7 g/dL (ref 3.5–5.0)
Alkaline Phosphatase: 90 U/L (ref 40–150)
BUN: 13 mg/dL (ref 7.0–26.0)
Potassium: 4.5 mEq/L (ref 3.5–5.1)

## 2011-11-23 MED ORDER — PROCHLORPERAZINE 25 MG RE SUPP: 25.0000 mg | Freq: Two times a day (BID) | RECTAL | Status: DC | PRN

## 2011-11-23 MED ORDER — LORAZEPAM 0.5 MG PO TABS: 0.5000 mg | ORAL_TABLET | Freq: Four times a day (QID) | ORAL | Status: DC | PRN

## 2011-11-23 MED ORDER — DEXAMETHASONE 4 MG PO TABS: ORAL_TABLET | ORAL | Status: DC

## 2011-11-23 MED ORDER — PROCHLORPERAZINE MALEATE 10 MG PO TABS: 10.0000 mg | ORAL_TABLET | Freq: Four times a day (QID) | ORAL | Status: DC | PRN

## 2011-11-23 MED ORDER — ONDANSETRON HCL 8 MG PO TABS: ORAL_TABLET | ORAL | Status: DC

## 2011-11-23 MED ORDER — LIDOCAINE-PRILOCAINE 2.5-2.5 % EX CREA: TOPICAL_CREAM | CUTANEOUS | Status: DC | PRN

## 2011-11-23 NOTE — Progress Notes (Signed)
Checked in new pt.  No financial concerns at this time. °

## 2011-11-23 NOTE — Patient Instructions (Signed)
You have been diagnosed with a locally advanced cancer in the right breast. You will ultimately need a right modified radical mastectomy.  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  You will be scheduled for Port-A-Cath insertion to help with your preoperative, neoadjuvant chemotherapy.  Ultimately, you will need a right modified radical mastectomy and some type of surgery on the left side which may be a lumpectomy or a mastectomy depending on what you decide. That decision will come later.  Dr. Jacinto Halim office will call you tomorrow to set up the Port-A-Cath surgery.

## 2011-11-23 NOTE — Progress Notes (Addendum)
Patient ID: Mary [name], female   DOB: January 25, 1961, 51 y.o.   MRN: [id number]  No chief complaint on file.   HPI Mary [name] is a 51 y.o. female.  She was referred by Dr. Cain Saupe at the breast center of [facility] for evaluation and management of bilateral breast cancer.  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 She states that she has noticed a lump in her right breast since June of this year. She states that she had mammograms and [facility] which did not show anything other than fluid. She has not had any drainage from her breast. She does notice the right breast is somewhat smaller than the left now and she has noted nipple retraction on the right.  Ultimately she had imaging studies at the breast center [facility]. There is a large 8-10 cm mass centrally and superiorly in the right breast. Mammograms of the left breast showed calcifications in a focal area in the upper outer quadrant.  Image guided biopsy of the right breast shows mixed invasive and noninvasive disease. This may be a lobular cancer. ER 95%, PR 53%, Ki-67 85%, HER-2-negative. 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  MRI on 11/18/2011 shows abnormal right axillary lymph nodes, large, complex mass in the right breast in the upper central mass, nipple retraction, and biopsy changes in the left breast upper outer quadrant at least 1.3 cm in size. It was also a small liver lesion thought to be a cyst.  Addendum (12/14/2011):   BRCA and other genetic tests are negative.  She is being seen in the [facility]  by me, Dr. Drue Second, and Dr. Chipper Herb.  Her past history is fairly unremarkable. She did have a left breast biopsy for fibroadenoma 5 years ago. She has irritable bowel syndrome and hypertension.  Family history reveals breast cancer in a paternal aunt,  ovarian cancer in a great aunt, pancreatic cancer in a paternal grandmother. HPI  Past Medical History  Diagnosis Date  . Plantar fasciitis   . Irritable bowel syndrome     Past Surgical History  Procedure Date  . Breast surgery   . Breast lumpectomy 2008    benign    Family History  Problem Relation Age of Onset  . Hypertension Mother   . Hypertension Maternal Grandmother   . Cancer Paternal Grandmother     pancreatic cancer    Social History History  Substance Use Topics  . Smoking status: Former Smoker    Quit date: 01/18/1991  . Smokeless tobacco: Never Used  . Alcohol Use: 3.0 oz/week    5 Glasses of wine per week    No Known Allergies  Current Outpatient Prescriptions  Medication Sig Dispense Refill  . ALPRAZolam (XANAX) 0.25 MG tablet Take 0.25 mg by mouth 3 (three) times daily as needed.      . Ascorbic Acid (VITAMIN C) 100 MG tablet Take 100 mg by mouth daily.        Marland Kitchen azithromycin (ZITHROMAX) 500 MG tablet Take 1 tablet (500 mg total) by mouth daily.  7 tablet  0  . b complex vitamins tablet Take 1 tablet by mouth daily.       . calcium carbonate (OS-CAL) 600 MG TABS Take 600 mg by mouth 2 (two) times daily with a meal.        . Multiple Vitamin (MULTIVITAMIN) tablet Take  1 tablet by mouth daily.        Marland Kitchen NEXIUM 40 MG capsule TAKE 1 CAPSULE BY MOUTH DAILY.  30 capsule  6  . spironolactone (ALDACTONE) 25 MG tablet Take 1 tablet (25 mg total) by mouth daily.  30 tablet  2  . vitamin B-12 (CYANOCOBALAMIN) 100 MCG tablet Take 50 mcg by mouth daily.          Review of Systems Review of Systems  Constitutional: Negative for fever, chills and unexpected weight change.  HENT: Negative for hearing loss, congestion, sore throat, trouble swallowing and voice change.   Eyes: Negative for visual disturbance.  Respiratory: Negative for cough and wheezing.   Cardiovascular: Negative for chest pain, palpitations and leg swelling.  Gastrointestinal: Negative for  nausea, vomiting, abdominal pain, diarrhea, constipation, blood in stool, abdominal distention and anal bleeding.  Genitourinary: Negative for hematuria, vaginal bleeding and difficulty urinating.  Musculoskeletal: Negative for arthralgias.  Skin: Negative for rash and wound.  Neurological: Negative for seizures, syncope and headaches.  Hematological: Negative for adenopathy. Does not bruise/bleed easily.  Psychiatric/Behavioral: Negative for confusion.    Last menstrual period 10/24/2011.  Physical Exam Physical Exam  Constitutional: She is oriented to person, place, and time. She appears well-developed and well-nourished. No distress.  HENT:  Head: Normocephalic and atraumatic.  Nose: Nose normal.  Mouth/Throat: No oropharyngeal exudate.  Eyes: Conjunctivae and EOM are normal. Pupils are equal, round, and reactive to light. Left eye exhibits no discharge. No scleral icterus.  Neck: Neck supple. No JVD present. No tracheal deviation present. No thyromegaly present.  Cardiovascular: Normal rate, regular rhythm, normal heart sounds and intact distal pulses.   No murmur heard. Pulmonary/Chest: Effort normal and breath sounds normal. No respiratory distress. She has no wheezes. She has no rales. She exhibits no tenderness.         The right breast is smaller than the left and there is nipple retraction. There is a large central mass in the right breast which is somewhat mobile, but not freely mobile. I don't feel any discrete lymph nodes in the right axilla. There is a biopsy site in the left breast and I do not feel a mass or axillary adenopathy.  Abdominal: Soft. Bowel sounds are normal. She exhibits no distension and no mass. There is no tenderness. There is no rebound and no guarding.  Musculoskeletal: She exhibits no edema and no tenderness.  Lymphadenopathy:    She has no cervical adenopathy.  Neurological: She is alert and oriented to person, place, and time. She exhibits normal  muscle tone. Coordination normal.  Skin: Skin is warm. No rash noted. She is not diaphoretic. No erythema. No pallor.  Psychiatric: She has a normal mood and affect. Her behavior is normal. Judgment and thought content normal.    Data Reviewed I have reviewed all of her imaging studies, pathology slides and breast diagnostic profile. I have coordinated her treatment plan and care with Dr. Drue Second and Dr. Chipper Herb.  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 Clinical stage T3,N1.  This central tumor partially filled her shrunken right breast, and it would be advisable to try to down stage this with neoadjuvant chemotherapy prior to right modified radical mastectomy.  High-grade DCIS with comedo necrosis, left breast, upper outer quadrant. Clinical stage Tis, N0. Options for treatment of the left breast include lumpectomy with sentinel load biopsy versus total mastectomy  and SLN biopsy.  ADDENDUM:  BRCA testing and other genetic tests are negative. (12/14/2011).    Plan    Neoadjuvant therapy will be initiated in the near future.  I will schedule for insertion of Port-A-Cath. I discussed the indications, details, techniques, and numerous risks of this procedure with the patient. She understands these issues. Her questions are answered. She agrees with this plan  Ultimately she will need a right modified radical mastectomy and simultaneous definitive surgery on the left, which may take the form of lumpectomy with sentinel node biopsy, or mastectomy and sentinel node. She has not decided about this yet.  We talked about plastic surgical referral, and we decided that we would refer her for plastic surgery consultation during her neoadjuvant chemotherapy. She is contemplating whether to have bilateral mastectomies and delayed reconstruction. She knows that she is not a good candidate for  immediate reconstruction on the right because she will need radiation therapy on the right.  I will follow her during her neoadjuvant chemotherapy, which should take 20 weeks or so.    She will  return to see me 3 months following insertion of Port-A-Cath, which should be midway through her chemotherapy.  We will plan end of treatment MRI  Dr. Drue Second will be referring her for genetic counseling(see addendum above) and a PET scan.       Angelia Mould. Derrell Lolling, M.D., [facility] General and Minimally invasive Surgery Breast and Colorectal Surgery Office:   [phone] Pager:   [phone]  11/23/2011, 4:02 PM

## 2011-11-23 NOTE — Telephone Encounter (Signed)
Continue  Made patient injections for:  12-09-2011  12-23-2011  01-06-2012  01-20-2012

## 2011-11-23 NOTE — Patient Instructions (Addendum)
We discussed neoadjuvant chemotherapy   PET scan  Echocardiogram Chemo class Port placement

## 2011-11-23 NOTE — Progress Notes (Signed)
Ronita Hargreaves [id number] 1960/05/23 51 y.o. 11/23/2011 4:11 PM  CC  Duncan Dull, MD [address] [place] [address] Dr. Claud Kelp Dr. 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   STAGE:   Cancer of upper-outer quadrant of female breast   Primary site: Breast (Right)   Staging method: AJCC 7th Edition   Clinical: Stage IIB (T3, N0, cM0)   Summary: Stage IIB (T3, N0, cM0)  REFERRING PHYSICIAN: Dr. Claud Kelp  HISTORY OF PRESENT ILLNESS:  Shenna Brissette is a 51 y.o. female.  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 Patient states that she had noted some nipple flattening. She went on to have mammogram performed at [facility] on 09/27/2011. Her breasts were dense there were no sonographic or mammographic abnormalities noted in the right breast. She went on to get a second opinion at the breast Center. At that time she was noted to have nipple retraction along the right breast is shrunken breast and a mass measuring 7-8 cm between 10 and 1:00 position 2 cm from the nipple. On 11/14/2011 patient went on to have an ultrasound-guided core needle biopsy. She was found to have invasive and in situ mammary carcinoma estrogen receptor +95% progesterone receptor +53% proliferation marker Ki-67 85% and HER-2/neu negative. Immunohistochemical staining for the crit when necessary showed that patient in fact had a lobular primary. On on 11/18/2011 patient had MRI performed that showed multicentric abnormal right upper/central irregular masslike enhancement within the right breast with abnormal  right axillary adenopathy as well as is thickening nipple retraction and skin edema. Patient also has been noted to have stereotactic core biopsy of the left breast calcifications within the upper-outer quadrant that is positive for high-grade DCIS. Left MRI showed only biopsy changes. Patient's case was discussed at the multidisciplinary breast conference. Her pathology and radiology was reviewed extensively. Recommendations made will based on NCCN guidelines for stage II invasive carcinoma.Patient Is without any complaints. Today she was seen in the multidisciplinary clinic by Dr. Chipper Herb and Dr. Claud Kelp   Past Medical History: Past Medical History  Diagnosis Date  . Plantar fasciitis   . Irritable bowel syndrome     Past Surgical History: Past Surgical History  Procedure Date  . Breast surgery   . Breast lumpectomy 2008    benign    Family History: Family History  Problem Relation Age of Onset  . Hypertension Mother   . Hypertension Maternal Grandmother   . Cancer Paternal Grandmother     pancreatic cancer    Social History History  Substance Use Topics  . Smoking status: Former Smoker    Quit date: 01/18/1991  . Smokeless tobacco: Never Used  . Alcohol Use: 3.0 oz/week    5 Glasses of wine per week    Allergies: No Known Allergies  Current Medications: Current Outpatient Prescriptions  Medication Sig Dispense Refill  . ALPRAZolam (XANAX) 0.25 MG tablet Take 0.25 mg by mouth 3 (three) times daily as needed.      . Ascorbic Acid (VITAMIN C) 100 MG tablet Take 100 mg by mouth daily.        Marland Kitchen b  complex vitamins tablet Take 1 tablet by mouth daily.       . calcium carbonate (OS-CAL) 600 MG TABS Take 600 mg by mouth 2 (two) times daily with a meal.        . Multiple Vitamin (MULTIVITAMIN) tablet Take 1 tablet by mouth daily.        Marland Kitchen NEXIUM 40 MG capsule TAKE 1 CAPSULE BY MOUTH DAILY.  30 capsule  6  . spironolactone (ALDACTONE) 25 MG tablet Take 1 tablet  (25 mg total) by mouth daily.  30 tablet  2  . vitamin B-12 (CYANOCOBALAMIN) 100 MCG tablet Take 50 mcg by mouth daily.        Marland Kitchen azithromycin (ZITHROMAX) 500 MG tablet Take 1 tablet (500 mg total) by mouth daily.  7 tablet  0    OB/GYN History:Menarche at age 60 she is premenopausal last menstrual period was on 11/22/2011 she is having regular periods. She is G0 P0 00  Fertility Discussion: No Desire for fertility preservation Prior History of Cancer:No prior history of malignancy  Health Maintenance:  Colonoscopy 10 years ago Bone Density No Last PAP smear 2 years ago  ECOG PERFORMANCE STATUS: 0 - Asymptomatic  Genetic Counseling/testing:Due to patient's early onset of breast cancer and by laterality as well as a paternal aunt having breast cancer in her 66s and maternal great-aunt with ovarian cancer in her 61s patient is recommended genetic testing and counseling  REVIEW OF SYSTEMS: Overall patient is doing well she denies any fevers chills night sweats headaches shortness of breath chest pains palpitations she has no myalgias or arthralgias she does have significant anxiety especially with the diagnosis of her disease. She has been having night sweats for the past 1 year he does have acid indigestion and she has history of IBS. She does notice some throbbing pain in her nipple in the right side. She's had some hot flashes for the last one year. She is otherwise no bleeding problems. No abnormal rashes no urinary problems no back pain joint pain or arthritis no headaches no seizures weakness forgetfulness or numbness or tingling. Remainder of the 14 point review of systems  PHYSICAL EXAMINATION: Blood pressure 152/93, pulse 75, temperature 97.9 F (36.6 C), resp. rate 20, height 5' 3.2" (1.605 m), weight 162 lb (73.483 kg), last menstrual period 10/24/2011.  Gen.: Patient is well-developed well-nourished female in no acute distress. HEENT exam EOMI PERRLA sclerae anicteric no  conjunctival pallor oral glucose is moist neck is supple no palpable cervical supraclavicular or axillary adenopathy lungs are clear to auscultation and percussion cardiovascular is regular rate rhythm no murmurs gallops or rubs abdomen is soft nontender nondistended bowel sounds are present no hepatosplenomegaly extremities no clubbing edema or cyanosis neuro patient's alert oriented otherwise nonfocal. Right breast no masses or nipple discharge. Left breast reveals a palpable mass with some nipple retraction no dimpling of the skin.    STUDIES/RESULTS: US Breast Right  11/14/2011  *RADIOLOGY REPORT*  Clinical Data:  The patient has noted a firm mass in the right upper outer quadrant associated with nipple retraction for several months.  DIGITAL DIAGNOSTIC RIGHT MAMMOGRAM WITH CAD AND RIGHT BREAST ULTRASOUND:  Comparison:  09/27/2011 and 03/16/2011 from [facility].  Findings:  The breast tissue is heterogeneously dense.  There is a "shrunken" appearance of the right breast parenchyma with retraction of the right nipple and thickening of the skin of the right nipple and areola.  Architectural distortion is noted in the right anterior  upper outer quadrant without defined mass. Mammographic images were processed with CAD.  On physical exam, there is a 7-8 cm firm mass between 10 and 1 o'clock in the right breast, 2 cm from the nipple. There is retraction of the right nipple.  Ultrasound is performed, showing intense shadowing between 11 and 1 o'clock, 2 cm from the right nipple.  It is very difficult to measure defined masses within this area of shadowing.  Sonography of the right axilla demonstrates no definitively abnormal nodes.  The appearance is suspicious for invasive lobular carcinoma. Ultrasound-guided core needle biopsy is recommended.  The procedure was discussed with the patient and she agreed with this plan.  IMPRESSION: Highly suspicious "shrunken" right breast parenchyma with  right nipple retraction in the right anterior breast between 10 and 1 o'clock.  Findings suggest invasive lobular carcinoma.  RECOMMENDATION: Ultrasound-guided core needle biopsy is recommended.  This will be performed and reported separately.  Report was telephoned to [facility] at Dr. Melina Schools office.  BI-RADS CATEGORY 5:  Highly suggestive of malignancy - appropriate action should be taken.   Original Report Authenticated By: Daryl Eastern, M.D.    Mr Breast Bilateral W Wo Contrast  11/22/2011  **ADDENDUM** CREATED: 11/22/2011 12:53:17  T2 hyperintense hepatic lesion is most likely a cyst, but further evaluation could be performed with ultrasound or liver mass protocol MRI with contrast for further evaluation, unless there is outside prior imaging as previously evaluated this finding.  There is no similar examination within this Health Care System.  Addended by:  Harrel Lemon, M.D. on 11/22/2011 12:53:17.  **END ADDENDUM** SIGNED BY: Harrel Lemon, M.D.   11/22/2011  *RADIOLOGY REPORT*  Clinical Data: Recently diagnosed left breast upper outer quadrant high-grade DCIS manifesting as mammographically detected calcifications.  Invasive mammary carcinoma and DCIS in the right breast diagnosed as a palpable mass 11/14/2011.  BILATERAL BREAST MRI WITH AND WITHOUT CONTRAST  Technique: Multiplanar, multisequence MR images of both breasts were obtained prior to and following the intravenous administration of 15ml of Multihance.  Three dimensional images were evaluated at the independent DynaCad workstation.  Comparison:  Prior exams  Findings: Multiple abnormal right axillary lymph nodes with cortical thickening are noted, largest 8 mm in short axis diameter. There is minimal borderline prominence of several high left axillary lymph nodes but no abnormal morphology or cortical thickening according to imaging criteria.  At the dome of the right hepatic lobe, there is a 1 cm T2 hyperintense lesion which does not  demonstrate internal enhancement allowing for technique.  There is right nipple retraction, diffuse right breast skin thickening, and skin and trabecular edema.  No internal mammary artery chain lymphadenopathy.  Post biopsy changes noted in the left upper outer quadrant with clip artifact.  No other abnormal T2- weighted hyperintensity is identified on either side.  On postcontrast imaging, there is a moderate background type parenchymal enhancement pattern bilaterally.  This may decrease the sensitivity for detection of malignancy.  Allowing for this, there is a dominant confluent area of irregular mass-like enhancement predominately in the right upper breast demonstrating areas of washin/washout type enhancement kinetics, measuring approximately overall 9.3 x 8 6.3 x 5.6 cm (craniocaudad by transverse by anteroposterior dimension).  This corresponds to the area of biopsy- proven malignancy.  In the left breast upper outer quadrant, there is post biopsy change and clip artifact associated area of adjacent irregular nodular enhancement demonstrating washin/washout type enhancement kinetics, measuring 1.3 x 1.1 x 0.8 cm.  This  does include the central biopsy cavity.  This corresponds to the area of biopsy- proven malignancy.  IMPRESSION: Multicentric abnormal right upper/central irregular mass-like enhancement, corresponding to the area of biopsy-proven breast cancer, with abnormal associated right axillary lymphadenopathy, right breast skin thickening, nipple retraction, and right breast skin/trabecular edema.  This suggests either secondary lymphedema or lymphangitic spread of tumor.  Left upper outer quadrant post biopsy change and irregular mass- like enhancement measuring 1.3 cm in maximal dimension, corresponding to the area of biopsy-proven DCIS.  The extent of disease could be underestimated given the background moderate enhancement type parenchymal pattern.  No other area of dominant abnormal enhancement in  either breast.  RECOMMENDATION: Treatment plan  BI-RADS CATEGORY 6:  Known biopsy-proven malignancy - appropriate action should be taken.  THREE-DIMENSIONAL MR IMAGE RENDERING ON INDEPENDENT WORKSTATION:  Three-dimensional MR images were rendered by post-processing of the original MR data on an independent workstation.  The three- dimensional MR images were interpreted, and findings were reported in the accompanying complete MRI report for this study.   Original Report Authenticated By: Harrel Lemon, M.D.    [place] Core Biopsy  11/15/2011  *RADIOLOGY REPORT*  Clinical Data:  Palpable mass in the right anterior upper outer quadrant.  "Shrunken" breast parenchyma in this location mammographically with intense shadowing noted sonographically.  ULTRASOUND GUIDED VACUUM ASSISTED CORE BIOPSY OF THE RIGHT BREAST  The patient and I discussed the procedure of ultrasound-guided biopsy, including benefits and alternatives.  We discussed the high likelihood of a successful procedure. We discussed the risks of the procedure including infection, bleeding, tissue injury, clip migration, and inadequate sampling.  Informed written consent was given.  Using sterile technique, 2% lidocaine, ultrasound guidance and a 12 gauge vacuum assisted needle, biopsy was performed of the palpable mass in the right anterior upper outer quadrant using a lateromedial approach.  At the conclusion of the procedure, a ribbon tissue marker clip was deployed into the biopsy cavity. Follow-up 2-view mammogram was performed and dictated separately.  IMPRESSION: Ultrasound-guided biopsy of a palpable mass in the right anterior upper outer quadrant.  No apparent complications.   Original Report Authenticated By: Daryl Eastern, M.D.    Mm Breast Stereo Biopsy Left  11/16/2011  *RADIOLOGY REPORT*  Clinical Data:  Suspicious calcifications in the left upper outer quadrant posteriorly.  STEREOTACTIC-GUIDED VACUUM ASSISTED BIOPSY OF THE LEFT BREAST  AND SPECIMEN RADIOGRAPH  The patient and I discussed the procedure of stereotactic-guided biopsy, including benefits and alternatives.  We discussed the high likelihood of a successful procedure. We discussed the risks of the procedure, including infection, bleeding, tissue injury, clip migration, and inadequate sampling.  Informed written consent was given.  Using sterile technique, 2% lidocaine, stereotactic guidance, and a 9 gauge vacuum assisted device, biopsy was performed of the calcifications in the left upper outer quadrant posteriorly using a craniocaudal approach.  Specimen radiograph was performed, showing numerous calcifications within multiple core specimens.  Specimens with calcifications are identified for pathology.  At the conclusion of the procedure, a top hat shaped tissue marker clip was deployed into the biopsy cavity.  Follow-up 2-view mammogram confirmed clip placement and removal of many of the calcifications.  IMPRESSION: Stereotactic-guided biopsy of calcifications in the left upper outer quadrant posteriorly.  No apparent complications.   Original Report Authenticated By: Daryl Eastern, M.D.    Mm Breast Surgical Specimen  11/16/2011  *RADIOLOGY REPORT*  Clinical Data:  Suspicious calcifications in the left upper outer quadrant posteriorly.  STEREOTACTIC-GUIDED  VACUUM ASSISTED BIOPSY OF THE LEFT BREAST AND SPECIMEN RADIOGRAPH  The patient and I discussed the procedure of stereotactic-guided biopsy, including benefits and alternatives.  We discussed the high likelihood of a successful procedure. We discussed the risks of the procedure, including infection, bleeding, tissue injury, clip migration, and inadequate sampling.  Informed written consent was given.  Using sterile technique, 2% lidocaine, stereotactic guidance, and a 9 gauge vacuum assisted device, biopsy was performed of the calcifications in the left upper outer quadrant posteriorly using a craniocaudal approach.  Specimen  radiograph was performed, showing numerous calcifications within multiple core specimens.  Specimens with calcifications are identified for pathology.  At the conclusion of the procedure, a top hat shaped tissue marker clip was deployed into the biopsy cavity.  Follow-up 2-view mammogram confirmed clip placement and removal of many of the calcifications.  IMPRESSION: Stereotactic-guided biopsy of calcifications in the left upper outer quadrant posteriorly.  No apparent complications.   Original Report Authenticated By: Daryl Eastern, M.D.    Mm Digital Diagnostic Unilat L  11/15/2011  *RADIOLOGY REPORT*  Clinical Data:  Invasive and in situ mammary carcinoma with lobular features was diagnosed in the right breast on 11/14/2011.  Left mammogram is obtained today to correlate with scheduled breast MRI on 11/18/2011.  DIGITAL DIAGNOSTIC LEFT MAMMOGRAM WITH CAD  Comparison:  03/16/2011 from [facility]  Findings:  The breast tissue is heterogeneously dense.  There are clustered pleomorphic microcalcifications in the left upper outer quadrant posteriorly.  The appearance is suspicious and biopsy is suggested.   No mass or nonsurgical distortion is identified. Mammographic images were processed with CAD.  IMPRESSION: Suspicious microcalcifications in the left upper outer quadrant posteriorly.  RECOMMENDATION: Stereotactic core needle biopsy is recommended.  This is scheduled for 11/16/2011.  BI-RADS CATEGORY 4:  Suspicious abnormality - biopsy should be considered.   Original Report Authenticated By: Daryl Eastern, M.D.    Mm Digital Diagnostic Unilat R  11/14/2011  *RADIOLOGY REPORT*  Clinical Data:  Ultrasound-guided core needle biopsy of the right anterior upper outer quadrant with clip placement.  DIGITAL DIAGNOSTIC RIGHT MAMMOGRAM  Comparison:  Previous exams.  Findings:  Films are performed following ultrasound guided biopsy of the right anterior upper outer quadrant, 2 cm from  the right nipple.  The InRad ribbon clip is appropriately positioned.  IMPRESSION: Appropriate clip placement following ultrasound-guided core needle biopsy of a suspicious mass in the right anterior upper outer quadrant.   Original Report Authenticated By: Daryl Eastern, M.D.    Mm Digital Diagnostic Unilat R  11/14/2011  *RADIOLOGY REPORT*  Clinical Data:  The patient has noted a firm mass in the right upper outer quadrant associated with nipple retraction for several months.  DIGITAL DIAGNOSTIC RIGHT MAMMOGRAM WITH CAD AND RIGHT BREAST ULTRASOUND:  Comparison:  09/27/2011 and 03/16/2011 from [facility].  Findings:  The breast tissue is heterogeneously dense.  There is a "shrunken" appearance of the right breast parenchyma with retraction of the right nipple and thickening of the skin of the right nipple and areola.  Architectural distortion is noted in the right anterior upper outer quadrant without defined mass. Mammographic images were processed with CAD.  On physical exam, there is a 7-8 cm firm mass between 10 and 1 o'clock in the right breast, 2 cm from the nipple. There is retraction of the right nipple.  Ultrasound is performed, showing intense shadowing between 11 and 1 o'clock, 2 cm from the right nipple.  It is very difficult to measure defined masses within this area of shadowing.  Sonography of the right axilla demonstrates no definitively abnormal nodes.  The appearance is suspicious for invasive lobular carcinoma. Ultrasound-guided core needle biopsy is recommended.  The procedure was discussed with the patient and she agreed with this plan.  IMPRESSION: Highly suspicious "shrunken" right breast parenchyma with right nipple retraction in the right anterior breast between 10 and 1 o'clock.  Findings suggest invasive lobular carcinoma.  RECOMMENDATION: Ultrasound-guided core needle biopsy is recommended.  This will be performed and reported separately.  Report was  telephoned to [facility] at Dr. Melina Schools office.  BI-RADS CATEGORY 5:  Highly suggestive of malignancy - appropriate action should be taken.   Original Report Authenticated By: Daryl Eastern, M.D.    Mm Radiologist Eval And Mgmt  11/17/2011  *RADIOLOGY REPORT*  Clinical Data: Stereotactic core biopsy left breast calcifications.  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  This is found to be concordant with imaging findings.  I discussed the results with the patient and her mother and answered their questions.  The biopsy site is clean without complications.  The patient has appointment for MRI breast on November 18, 2011 and has an appointment for multidisciplinary clinic next Wednesday.  IMPRESSION: Post biopsy evaluation of left breast as described.   Original Report Authenticated By: Sherian Rein, M.D.    Mm Radiologist Eval And Mgmt  11/15/2011  *RADIOLOGY REPORT*  ESTABLISHED PATIENT OFFICE VISIT - LEVEL II [phone])  Chief Complaint:  The patient returns for discussion of the pathologic findings after ultrasound-guided core needle biopsy of the right breast performed on 11/14/2011.  History:  The patient has noted a firm mass in the right subareolar region and right upper outer quadrant for several months with retraction of the right nipple.  Imaging evaluation demonstrated a suspicious shadowing mass in the right anterior upper outer quadrant. Ultrasound-guided core needle biopsy was performed.  Exam:  On physical examination, there is no sign of hematoma or infection at the biopsy site.  Pathology: Histologic evaluation demonstrates invasive and in situ mammary carcinoma with lobular features.  Assessment and Plan:  Results were discussed with the patient and her mother.  The patient requested consultation in [place].  She is scheduled to be seen in the [facility] at the regional cancer Center at [facility] on 11/23/2011.  Breast MRI is scheduled for 11/18/2011 at 4:45 p.m. questions were answered.  Educational materials were given.   Original Report Authenticated By: Daryl Eastern, M.D.      LABS:    Chemistry      Component Value Date/Time   NA 141 11/23/2011 1225   NA 140 05/20/2011 1204   K 4.5 11/23/2011 1225   K 5.2 05/20/2011 1204   CL 107 11/23/2011 1225   CL 108 05/20/2011 1204   CO2 23 11/23/2011 1225   CO2 22 05/20/2011 1204   BUN 13.0 11/23/2011 1225   BUN 10 05/20/2011 1204   CREATININE 0.9 11/23/2011 1225   CREATININE 0.82 05/20/2011 1204   CREATININE 0.9 01/18/2011 1202      Component Value Date/Time   CALCIUM 9.4 11/23/2011 1225   CALCIUM 8.6 05/20/2011 1204   ALKPHOS 90 11/23/2011 1225   ALKPHOS 80 05/20/2011 1204   AST 16 11/23/2011 1225   AST 21 05/20/2011 1204   ALT 19  11/23/2011 1225   ALT 29 05/20/2011 1204   BILITOT 0.30 11/23/2011 1225   BILITOT 0.3 05/20/2011 1204      Lab Results  Component Value Date   WBC 9.0 11/23/2011   HGB 13.8 11/23/2011   HCT 40.3 11/23/2011   MCV 93.1 11/23/2011   PLT 247 11/23/2011   PATHOLOGY: ADDITIONAL INFORMATION: The malignant cells show focal faint and granular membranous staining for E-Cadherin. Overall, the histologic features favor a lobular phenotype, in my opinion. (JBK:kh 11-24-11) Pecola Leisure MD Pathologist, Electronic Signature ( Signed 11/24/2011) PROGNOSTIC INDICATORS - ACIS Results IMMUNOHISTOCHEMICAL AND MORPHOMETRIC ANALYSIS BY THE AUTOMATED CELLULAR IMAGING SYSTEM (ACIS) This invasive carcinoma shows the following breast prognostic profile. Estrogen Receptor (Negative, <1%): 95%,POSITIVE, MODERATE STAINING INTENSITY Progesterone Receptor (Negative, <1%): 53%, POSITIVE, STRONG STAINING INTENSITY Proliferation Marker Ki67 by M IB-1 (Low<20%): 85% All controls stained appropriately Abigail Miyamoto MD Pathologist, Electronic Signature ( Signed 11/18/2011) CHROMOGENIC IN-SITU  HYBRIDIZATION Interpretation HER-2/NEU BY CISH - NO AMPLIFICATION OF HER-2 DETECTED. THE RATIO OF HER-2: CEP 17 SIGNALS WAS 1.21. Reference range: Ratio: HER2:CEP17 < 1.8 - gene amplification not observed 1 of 3 FINAL for Burbach, Maame [phone]) ADDITIONAL INFORMATION:(continued) Ratio: HER2:CEP 17 1.8-2.2 - equivocal result Ratio: HER2:CEP17 > 2.2 - gene amplification observed Pecola Leisure MD Pathologist, Electronic Signature ( Signed 11/18/2011) FINAL DIAGNOSIS Diagnosis Breast, right, needle core biopsy, mass, 11-12 o'clock, 2 cm / nipple - INVASIVE AND IN SITU MAMMARY CARCINOMA. - LYMPHOVASCULAR INVASION IS IDENTIFIED. - SEE COMMENT. Microscopic Comment The carcinoma has some lobular features and appears Grade 2. A breast prognostic profile will be performed and the results reported separately. The results were called to the [facility] on 11/15/2011. (JBK:caf 11/15/11) Pecola Leisure MD Pathologist, Electronic Signature (Case signed 11/15/2011) Specimen Gross and Clinical Information Specimen Comment Firm right breast mass with right nipple retraction In formalin 3:55, extracted <1 min Specimen(s) Obtained: Breast, right, needle core biopsy, mass, 11-12 o'clock, 2 cm / nipple Specimen Clinical Information Suspect invasive lobular carcinoma  ADDITIONAL INFORMATION: PROGNOSTIC INDICATORS - ACIS Results IMMUNOHISTOCHEMICAL AND MORPHOMETRIC ANALYSIS BY THE AUTOMATED CELLULAR IMAGING SYSTEM (ACIS) Estrogen Receptor (Negative, <1%): 81%, STRONG STAINING INTENSITY Progesterone Receptor (Negative, <1%): 74%, STRONG STAINING INTENSITY All controls stained appropriately Pecola Leisure MD Pathologist, Electronic Signature ( Signed 11/23/2011) FINAL DIAGNOSIS Diagnosis Breast, left, needle core biopsy, ~ 2 o'clock, 7 cm from nipple - 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 - SEE COMMENT. Microscopic Comment Focally, the  high grade ductal carcinoma in situ appears to be partially involving an area of adenosis. A quantitative estrogen receptor and quantitative progesterone receptor immunohistochemical profile will be performed and reported in an addendum. The findings are called to the [facility] on 11/17/11. Dr. Colonel Bald has seen this case in consultation with agreement. (RAH:caf 11/17/11) Zandra Abts MD Pathologist, Electronic Signature (Case signed 11/17/2011) 1 of ASSESSMENT    51 year old female with  #1 new diagnosis of invasive lobular carcinoma of the left breast diagnosed with needle core biopsy. Patient has extensive disease including involvement of the lymph node. She will need systemic therapy. Recommendation is neoadjuvant chemotherapy. I have discussed treatment with 5-FU epirubicin Cytoxan to be given dose dense followed by Taxol for 12 weeks neoadjuvant bleeding.  #2 because of patient's extent of disease she is recommended staging studies including getting a PET scan.  #3 have also recommended genetic counseling and testing to her oh early onset of breast cancer and history of breast cancers  in the family.  #4 We will also order echocardiogram that she is getting an anthracycline.  #6 risks and benefits of treatment were discussed with the patient and her family. They are in agreement to proceed with the recommended neoadjuvant therapy. Eventually patient will require a mastectomy with sentinel lymph node biopsy on the right side with lumpectomy and sentinel lymph node biopsy. However we would like to see what her genetic testing results are. Certainly if she is positive for BRCA1 or BRCA2 gene mutation she may require bilateral mastectomies with even bilateral salpingo-oophorectomies.      PLAN:    #1 patient will proceed with getting an echocardiogram as well as Port-A-Cath placed.  #2 my plan will be to see her back in 2 weeks' time to begin her chemotherapy. The patient  is planning on going away for her birthday I do think that that is fine. We will also get a PET scan scheduled.       Thank you so much for allowing me to participate in the care of [facility]. I will continue to follow up the patient with you and assist in her care.  All questions were answered. The patient knows to call the clinic with any problems, questions or concerns. We can certainly see the patient much sooner if necessary.  I spent 60 minutes counseling the patient face to face. The total time spent in the appointment was 60 minutes.  Drue Second, MD Medical/Oncology [facility] [phone] (beeper) [phone] (Office)  11/23/2011, 4:12 PM

## 2011-11-23 NOTE — Telephone Encounter (Signed)
chemotherapy on 9/19, 10/3, 10/17, 10/31  see kk on 9/19, 9/26  Sent michelle an email to set up patient treatment   Made patient appointment for pet scan and echo patient requested the same day for the scans 12-01-2011 pet scan first then echo at [facility]   Made patient apppointment for chemo class on 11-29-2011 at 5:00pm showed patient the chemo class room

## 2011-11-23 NOTE — Progress Notes (Signed)
I met Mrs. Mary [name] and her husband today in the multidisciplinary breast clinic.  She indicated that her distress level was a "10", saying that adjusting to this diagnosis is the biggest source of her distress.  I encouraged her to attend Breast Cancer Support Group; she did not want a referral to [facility] at this time.  Mrs. Mary [name] did inquire about the availability of counseling services.  I will follow-up with a phone call to check on her in the following weeks.

## 2011-11-23 NOTE — Progress Notes (Signed)
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 female who is seen today at the [facility] for evaluation of her invasive mammary carcinoma the right breast and DCIS of the left breast. The patient states that she had right breast nipple flattening and had mammography at [facility] on 09/27/2011. Her breast was dense and there wass no sonographic or mammographic abnormality appreciated within the right breast. She sought a second opinion at the [facility] Center where she was noted to have nipple retraction along the right breast with the appearance of a shrunken breast and a mass measuring approximate 7-8 cm between 10 and 1:00, 2 cm from the nipple.. She underwent ultrasound-guided core biopsy on 11/14/2011. She was found to have invasive and in situ mammary carcinoma which was ER positive at 95% and PR positive at 53% with a proliferation marker of 85%. There was no amplification of HER-2/neu. Further immunohistochemical testing (E cadherin) is being performed to see if she has a lobular primary. Breast MR on 01/18/2012 showed multicentric abnormal right upper/central irregular masslike enhancement within the right breast with abnormal right axillary adenopathy in addition to breast thickening, nipple retraction and skin edema. I should mention that she had a stereotactic core biopsy of left breast calcifications within the upper-outer quadrant diagnostic for high-grade DCIS. Left breast MR from 01/18/2012 showed biopsy changes along the upper-outer quadrant of left breast measuring 1.3 cm.  She seen today with Dr. Derrell Lolling and Dr. Welton Flakes.  PREVIOUS RADIATION THERAPY: No   PAST MEDICAL HISTORY:  has a past medical history of Plantar fasciitis and Irritable bowel syndrome.     PAST SURGICAL HISTORY:  Past Surgical History  Procedure Date  . Breast surgery   . Breast lumpectomy 2008    benign     FAMILY HISTORY: family history includes Cancer in her paternal grandmother and Hypertension in her maternal grandmother and mother.   SOCIAL HISTORY:  reports that she quit smoking about 20 years ago. She has never used smokeless tobacco. She reports that she drinks about 3 ounces of alcohol per week. She reports that she does not use illicit drugs. Married, no children. 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   ALLERGIES: Review of patient's allergies indicates no known allergies.   MEDICATIONS:  Current Outpatient Prescriptions  Medication Sig Dispense Refill  . ALPRAZolam (XANAX) 0.25 MG tablet Take 0.25 mg by mouth 3 (three) times daily as needed.      . Ascorbic Acid (VITAMIN C) 100 MG tablet Take 100 mg by mouth daily.        Marland Kitchen azithromycin (ZITHROMAX) 500 MG tablet Take 1 tablet (500 mg total) by mouth daily.  7 tablet  0  . b complex vitamins tablet Take 1 tablet by mouth daily.       . calcium carbonate (OS-CAL) 600 MG TABS Take 600 mg by mouth 2 (two) times daily with a meal.        . Multiple Vitamin (MULTIVITAMIN)  tablet Take 1 tablet by mouth daily.        Marland Kitchen NEXIUM 40 MG capsule TAKE 1 CAPSULE BY MOUTH DAILY.  30 capsule  6  . spironolactone (ALDACTONE) 25 MG tablet Take 1 tablet (25 mg total) by mouth daily.  30 tablet  2  . vitamin B-12 (CYANOCOBALAMIN) 100 MCG tablet Take 50 mcg by mouth daily.           REVIEW OF SYSTEMS:  Pertinent items are noted in HPI.    PHYSICAL EXAM: Alert and oriented 51 year old white female appearing her stated age. Wt Readings from Last 3 Encounters:  11/23/11 162 lb  (73.483 kg)  09/21/11 162 lb 12 oz (73.823 kg)  05/20/11 159 lb 8 oz (72.349 kg)   Temp Readings from Last 3 Encounters:  11/23/11 97.9 F (36.6 C)   09/21/11 98.8 F (37.1 C) Oral  05/20/11 98.5 F (36.9 C) Oral   BP Readings from Last 3 Encounters:  11/23/11 152/93  09/21/11 150/84  05/20/11 124/68   Pulse Readings from Last 3 Encounters:  11/23/11 75  09/21/11 88  05/20/11 82    Head and neck examination: Grossly unremarkable. Nodes: Without palpable cervical, supraclavicular, or axillary lymphadenopathy. Chest: Lungs clear. Heart: Regular in rhythm. Back: Without spinal or CVA tenderness. Breasts: The right breast is slightly shrunken and there is nipple retraction. There is a palpable mass centered over the upper central breast measuring at least 8-10 cm. This does not have an inflammatory appearance. On inspection of the left breast there is a bruise at approximately 12:00, no masses are appreciated. There is a scar from her previous benign breast biopsy. Abdomen without hepatomegaly. Extremities: Without edema. Neurologic examination: Grossly nonfocal.    LABORATORY DATA:  Lab Results  Component Value Date   WBC 9.0 11/23/2011   HGB 13.8 11/23/2011   HCT 40.3 11/23/2011   MCV 93.1 11/23/2011   PLT 247 11/23/2011   Lab Results  Component Value Date   NA 141 11/23/2011   K 4.5 11/23/2011   CL 107 11/23/2011   CO2 23 11/23/2011   Lab Results  Component Value Date   ALT 19 11/23/2011   AST 16 11/23/2011   ALKPHOS 90 11/23/2011   BILITOT 0.30 11/23/2011      IMPRESSION: Clinical stage IIIA  (T3, N1, M0) invasive mammary carcinoma the right breast, stage 0 (Tis N0 M0) DCIS of the left breast. We agree that she needs staging with a PET scan based on her locally advanced disease on the right. She has been seen by Dr. Derrell Lolling and Dr. Welton Flakes, and I believe that she is being scheduled for neoadjuvant chemotherapy followed by modified radical mastectomy on the right. She will need post mastectomy  radiation therapy for her right breast cancer. Her left breast can be treated by mastectomy or partial mastectomy followed by radiation therapy. Following completion of radiation therapy, I suspect she'll be offered adjuvant hormone therapy. I discussed the potential acute and late toxicities of radiation therapy. I will see the patient back following definitive surgery. If she is interested in reconstruction she should have a delayed reconstruction following post mastectomy radiation therapy.   PLAN: As discussed above.   I spent 40 minutes minutes face to face with the patient and more than 50% of that time was spent in counseling and/or coordination of care.

## 2011-11-24 ENCOUNTER — Encounter: Payer: Self-pay | Admitting: Genetic Counselor

## 2011-11-24 ENCOUNTER — Encounter: Payer: Self-pay | Admitting: *Deleted

## 2011-11-24 ENCOUNTER — Ambulatory Visit: Payer: Self-pay | Admitting: Genetic Counselor

## 2011-11-24 ENCOUNTER — Other Ambulatory Visit: Payer: Self-pay | Admitting: Lab

## 2011-11-24 ENCOUNTER — Encounter: Payer: Self-pay | Admitting: Oncology

## 2011-11-24 ENCOUNTER — Telehealth: Payer: Self-pay | Admitting: *Deleted

## 2011-11-24 DIAGNOSIS — C50419 Malignant neoplasm of upper-outer quadrant of unspecified female breast: Secondary | ICD-10-CM

## 2011-11-24 LAB — CANCER ANTIGEN 27.29: CA 27.29: 27 U/mL (ref 0–39)

## 2011-11-24 NOTE — Telephone Encounter (Signed)
Per staff message and POF I have scheduled appts.  JMW  

## 2011-11-24 NOTE — Progress Notes (Signed)
Dr.  Welton Flakes requested a consultation for genetic counseling and risk assessment for Mary [name], a 51 y.o. female, for discussion of her personal history of breast cancer and family history of breast, pancreatic and ovarian cancer. She presents to clinic today to discuss the possibility of a genetic predisposition to cancer, and to further clarify her risks, as well as her family members' risks for cancer.   HISTORY OF PRESENT ILLNESS: In August 2013, at the age of 89, Mary [name] was diagnosed with invasive ductal carcinoma of the right breast and DCIS of the left breast. This will be treated with chemotherapy, surgery and radiation.    Past Medical History  Diagnosis Date  . Plantar fasciitis   . Irritable bowel syndrome   . Breast cancer August 2013    bilateral    Past Surgical History  Procedure Date  . Breast surgery   . Breast lumpectomy 2008    benign    History  Substance Use Topics  . Smoking status: Former Smoker -- 0.5 packs/day for 15 years    Types: Cigarettes    Quit date: 01/18/1991  . Smokeless tobacco: Never Used  . Alcohol Use: 3.0 oz/week    5 Glasses of wine per week    REPRODUCTIVE HISTORY AND PERSONAL RISK ASSESSMENT FACTORS: Menarche was at age 9.   Perimenopasual Uterus Intact: Yes Ovaries Intact: Yes [id number] , first live birth at age N/A  She has not previously undergone treatment for infertility.   OCP use for 20 years   She has not used HRT in the past.    FAMILY HISTORY:  We obtained a detailed, 4-generation family history.  Significant diagnoses are listed below: Family History  Problem Relation Age of Onset  . Hypertension Mother   . Hypertension Maternal Grandmother   . Pancreatic cancer Paternal Grandmother     diagnosed in her 67s  . Ovarian cancer Maternal Aunt     maternal grandmother's sister  . Breast cancer Paternal Aunt     diagnosed in late 11s early 30s  . Parkinsonism Paternal Grandfather   The patient was  diagnosed with both IDC and DCIS in August 2013.  She is an only child.  There is no reported cancer history on the maternal side of her family.  The patient has a opaternal aunt who had breast cancer in her late 37s- 63s, and her paternal grandmother was diagnosed with pancreatic cancer in her 4s.  There is no other reported cancer history on this side of the family.  Patient's maternal ancestors are of unknown descent, and paternal ancestors are of unknown descent. There is no reported Ashkenazi Jewish ancestry. There is no known consanguinity.  GENETIC COUNSELING RISK ASSESSMENT, DISCUSSION, AND SUGGESTED FOLLOW UP: We reviewed the natural history and genetic etiology of sporadic, familial and hereditary cancer syndromes.  About 5-10% of breast cancer is hereditary.  Of this, about 85% is the result of a BRCA1 or BRCA2 mutation.  We reviewed the red flags of hereditary cancer syndromes and the dominant inheritance patterns.  If the BRCA testing is negative, we discussed that we could be testing for the wrong gene.  We discussed gene panels, and that several cancer genes that are associated with different cancers can be tested at the same time.  Because of the different types of cancer that are in the patient's family, we will consider the BRCAPlus panel testing that looks at 6 clinically actionable hereditary genes.   The patient's personal history  of breast cancer and family history of breast, ovarian and pancreatic cancer is suggestive of the following possible diagnosis: hereditary cancer syndrome  We discussed that identification of a hereditary cancer syndrome may help her care providers tailor the patients medical management. If a mutation indicating a hereditary cancer syndrome is detected in this case, the [employer] recommendations would include increased cancer surveillance and possible prophylactic surgery. If a mutation is detected, the patient will be referred  back to the referring provider and to any additional appropriate care providers to discuss the relevant options.   If a mutation is not found in the patient, this will decrease the likelihood of a hereditary cancer syndrome as the explanation for her personal history of cancer. Cancer surveillance options would be discussed for the patient according to the appropriate standard National Comprehensive Cancer Network and American Cancer Society guidelines, with consideration of their personal and family history risk factors. In this case, the patient will be referred back to their care providers for discussions of management.   In order to estimate her chance of having a BRCA1 or BRCA2 mutation, we used statistical models (Penn II) and laboratory data that take into account her personal medical history, family history and ancestry.  Because each model is different, there can be a lot of variability in the risks they give.  Therefore, these numbers must be considered a rough range and not a precise risk of having a BRCA1 or BRCA2 mutation.  This model estimates that she has approximately a 28% chance of having a mutation. Based on this assessment of her family and personal history, genetic testing is not recommended.  After considering the risks, benefits, and limitations, the patient provided informed consent for  the following  testing: BRCAPlus through [employer]   Per the patient's request, we will contact her by telephone to discuss these results. A follow up genetic counseling visit will be scheduled if indicated.  The patient was seen for a total of 60 minutes, greater than 50% of which was spent face-to-face counseling.  This plan is being carried out per Dr. Milta Deiters recommendations.  This note will also be sent to the referring provider via the electronic medical record. The patient will be supplied with a summary of this genetic counseling discussion as well as educational information on the  discussed hereditary cancer syndromes following the conclusion of their visit.   Patient was discussed with Dr. Drue Second.  _______________________________________________________________________ For Office Staff:  Number of people involved in session: 2 Was an Intern/ student involved with case: not applicable

## 2011-11-24 NOTE — Progress Notes (Signed)
Put fmla form on nurse's desk °

## 2011-11-24 NOTE — Progress Notes (Signed)
Mailed after appt letter to pt. 

## 2011-11-28 ENCOUNTER — Encounter (HOSPITAL_BASED_OUTPATIENT_CLINIC_OR_DEPARTMENT_OTHER): Payer: Self-pay | Admitting: *Deleted

## 2011-11-28 ENCOUNTER — Telehealth: Payer: Self-pay | Admitting: *Deleted

## 2011-11-28 ENCOUNTER — Encounter: Payer: Self-pay | Admitting: Oncology

## 2011-11-28 NOTE — Telephone Encounter (Signed)
Spoke to pt concerning [facility] from 9/4.  Pt denies questions or concerns regarding dx or treatment care plan.  Encourage pt to call with needs.  Received verbal understanding.  Contact information given.

## 2011-11-28 NOTE — Pre-Procedure Instructions (Signed)
To go to [facility] for CXR

## 2011-11-28 NOTE — Progress Notes (Signed)
Faxed patient's fmla forms to [phone] and mailed originals to her home.

## 2011-11-29 ENCOUNTER — Other Ambulatory Visit: Payer: Commercial Managed Care - PPO

## 2011-11-29 ENCOUNTER — Encounter: Payer: Self-pay | Admitting: *Deleted

## 2011-11-30 ENCOUNTER — Encounter: Payer: Self-pay | Admitting: Oncology

## 2011-11-30 ENCOUNTER — Ambulatory Visit
Admission: RE | Admit: 2011-11-30 | Discharge: 2011-11-30 | Disposition: A | Discharge status: Home / Self Care | Payer: 59 | Source: Ambulatory Visit | Attending: General Surgery | Admitting: General Surgery

## 2011-11-30 NOTE — Progress Notes (Signed)
Darlena met w/ pt on 11/29/11 to sign [place] [employer] copay asst and faxed the auth to the [facility] [employer] program.  I went online to register her card.  [place] is [id number].

## 2011-11-30 NOTE — H&P (Signed)
Mary [name]    MRN: [id number]   Description: 51 year old female  Provider: Ernestene Mention, MD  Department: [facility]       Diagnoses     Cancer of upper-outer quadrant of female breast   - Primary    174.4               History and Physical     Ernestene Mention, MD   Patient ID: Mary [name], female   DOB: 06/26/1960, 51 y.o.   MRN: [id number]                HPI Mary [name] is a 51 y.o. female.  She was referred by Dr. Cain Saupe at the breast center of [facility] for evaluation and management of bilateral breast cancer.   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 She states that she has noticed a lump in her right breast since June of this year. She states that she had mammograms and [facility] which did not show anything other than fluid. She has not had any drainage from her breast. She does notice the right breast is somewhat smaller than the left now and she has noted nipple retraction on the right.   Ultimately she had imaging studies at the breast center [facility]. There is a large 8-10 cm mass centrally and superiorly in the right breast. Mammograms of the left breast showed calcifications in a focal area in the upper outer quadrant.   Image guided biopsy of the right breast shows mixed invasive and noninvasive disease. This may be a lobular cancer. ER 95%, PR 53%, Ki-67 85%, HER-2-negative. 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   MRI on 11/18/2011 shows abnormal right axillary lymph nodes, large, complex mass in the right breast in the upper central mass, nipple retraction, and biopsy changes in the left breast upper outer quadrant at least 1.3 cm in size. It was also a small liver lesion thought to be a cyst.   She is being seen in the [facility]  by me, Dr. Drue Second,  and Dr. Chipper Herb.   Her past history is fairly unremarkable. She did have a left breast biopsy for fibroadenoma 5 years ago. She has irritable bowel syndrome and hypertension.   Family history reveals breast cancer in a paternal aunt, ovarian cancer in a great aunt, pancreatic cancer in a paternal grandmother.      Past Medical History   Diagnosis  Date   .  Plantar fasciitis     .  Irritable bowel syndrome         Past Surgical History   Procedure  Date   .  Breast surgery     .  Breast lumpectomy  2008       benign       Family History   Problem  Relation  Age of Onset   .  Hypertension  Mother     .  Hypertension  Maternal Grandmother     .  Cancer  Paternal Grandmother         pancreatic cancer      Social History History   Substance Use Topics   .  Smoking status:  Former Smoker       Quit date:  01/18/1991   .  Smokeless tobacco:  Never Used   .  Alcohol Use:  3.0 oz/week       5 Glasses of wine per week      No Known Allergies    Current Outpatient Prescriptions   Medication  Sig  Dispense  Refill   .  ALPRAZolam (XANAX) 0.25 MG tablet  Take 0.25 mg by mouth 3 (three) times daily as needed.         .  Ascorbic Acid (VITAMIN C) 100 MG tablet  Take 100 mg by mouth daily.           Marland Kitchen  azithromycin (ZITHROMAX) 500 MG tablet  Take 1 tablet (500 mg total) by mouth daily.   7 tablet   0   .  b complex vitamins tablet  Take 1 tablet by mouth daily.          .  calcium carbonate (OS-CAL) 600 MG TABS  Take 600 mg by mouth 2 (two) times daily with a meal.           .  Multiple Vitamin (MULTIVITAMIN) tablet  Take 1 tablet by mouth daily.           Marland Kitchen  NEXIUM 40 MG capsule  TAKE 1 CAPSULE BY MOUTH DAILY.   30 capsule   6   .  spironolactone (ALDACTONE) 25 MG tablet  Take 1 tablet (25 mg total) by mouth daily.   30 tablet   2   .  vitamin B-12 (CYANOCOBALAMIN) 100 MCG tablet  Take 50 mcg by mouth daily.              Review of Systems   Constitutional:  Negative for fever, chills and unexpected weight change.  HENT: Negative for hearing loss, congestion, sore throat, trouble swallowing and voice change.   Eyes: Negative for visual disturbance.  Respiratory: Negative for cough and wheezing.   Cardiovascular: Negative for chest pain, palpitations and leg swelling.  Gastrointestinal: Negative for nausea, vomiting, abdominal pain, diarrhea, constipation, blood in stool, abdominal distention and anal bleeding.  Genitourinary: Negative for hematuria, vaginal bleeding and difficulty urinating.  Musculoskeletal: Negative for arthralgias.  Skin: Negative for rash and wound.  Neurological: Negative for seizures, syncope and headaches.  Hematological: Negative for adenopathy. Does not bruise/bleed easily.  Psychiatric/Behavioral: Negative for confusion.    Last menstrual period 10/24/2011.   Physical Exam   Constitutional: She is oriented to person, place, and time. She appears well-developed and well-nourished. No distress.  HENT:   Head: Normocephalic and atraumatic.   Nose: Nose normal.   Mouth/Throat: No oropharyngeal exudate.  Eyes: Conjunctivae and EOM are normal. Pupils are equal, round, and reactive to light. Left eye exhibits no discharge. No scleral icterus.  Neck: Neck supple. No JVD present. No tracheal deviation present. No thyromegaly present.  Cardiovascular: Normal rate, regular rhythm, normal heart sounds and intact distal pulses.    No murmur heard. Pulmonary/Chest: Effort normal and breath sounds normal. No respiratory distress. She has no wheezes. She has no rales. She exhibits no tenderness. Large, central right breast mass, right breast somewhat shrunken. Skin OK.        The right breast is smaller than the left and there is nipple retraction. There is a large central mass in the right breast which is somewhat mobile, but not freely mobile. I don't feel any discrete lymph nodes in the right axilla. There is a biopsy site in  the left breast and I do not feel a mass or axillary adenopathy.  Abdominal: Soft. Bowel sounds  are normal. She exhibits no distension and no mass. There is no tenderness. There is no rebound and no guarding.  Musculoskeletal: She exhibits no edema and no tenderness.  Lymphadenopathy:    She has no cervical adenopathy.  Neurological: She is alert and oriented to person, place, and time. She exhibits normal muscle tone. Coordination normal.  Skin: Skin is warm. No rash noted. She is not diaphoretic. No erythema. No pallor.  Psychiatric: She has a normal mood and affect. Her behavior is normal. Judgment and thought content normal.    Data Reviewed I have reviewed all of her imaging studies, pathology slides and breast diagnostic profile. I have coordinated her treatment plan and care with Dr. Drue Second and Dr. Chipper Herb.   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 Clinical stage T3,N1.   This central tumor partially filled her shrunken right breast, and it would be advisable to try to down stage this with neoadjuvant chemotherapy prior to right modified radical mastectomy.   High-grade DCIS with comedo necrosis, left breast, upper outer quadrant. Clinical stage Tis, N0. Options for treatment of the left breast include lumpectomy with sentinel load biopsy versus total mastectomy and SLN biopsy.   Plan Neoadjuvant therapy will be initiated in the near future.   I will schedule for insertion of Port-A-Cath. I discussed the indications, details, techniques, and numerous risks of this procedure with the patient. She understands these issues. Her questions are answered. She agrees with this plan   Ultimately she will need a right modified radical mastectomy and simultaneous definitive surgery on the left, which may take the form of lumpectomy with sentinel node biopsy, or mastectomy and sentinel  node. She has not decided about this yet.   We talked about plastic surgical referral, and we decided that we would refer her for plastic surgery consultation during her neoadjuvant chemotherapy. She is contemplating whether to have bilateral mastectomies and delayed reconstruction. She knows that she is not a good candidate for immediate reconstruction on the right because she will need radiation therapy on the right.   I will follow her during her neoadjuvant chemotherapy, which should take 20 weeks or so.    She will  return to see me 3 months following insertion of Port-A-Cath, which should be midway through her chemotherapy.   We will plan end of treatment MRI   Dr. Drue Second will be referring her for genetic counseling and a PET scan.       Angelia Mould. Derrell Lolling, M.D., [facility] General and Minimally invasive Surgery Breast and Colorectal Surgery Office:   [phone] Pager:   [phone]  d

## 2011-12-01 ENCOUNTER — Encounter (HOSPITAL_COMMUNITY): Payer: Self-pay

## 2011-12-01 ENCOUNTER — Ambulatory Visit (HOSPITAL_COMMUNITY)
Admission: RE | Admit: 2011-12-01 | Discharge: 2011-12-01 | Disposition: A | Discharge status: Home / Self Care | Payer: 59 | Source: Ambulatory Visit | Attending: Oncology | Admitting: Oncology

## 2011-12-01 ENCOUNTER — Encounter (HOSPITAL_COMMUNITY)
Admission: RE | Admit: 2011-12-01 | Discharge: 2011-12-01 | Disposition: A | Discharge status: Home / Self Care | Payer: 59 | Source: Ambulatory Visit | Attending: Oncology | Admitting: Oncology

## 2011-12-01 DIAGNOSIS — C50419 Malignant neoplasm of upper-outer quadrant of unspecified female breast: Secondary | ICD-10-CM

## 2011-12-01 DIAGNOSIS — Z01818 Encounter for other preprocedural examination: Secondary | ICD-10-CM | POA: Insufficient documentation

## 2011-12-01 DIAGNOSIS — Z79899 Other long term (current) drug therapy: Secondary | ICD-10-CM | POA: Insufficient documentation

## 2011-12-01 DIAGNOSIS — Z09 Encounter for follow-up examination after completed treatment for conditions other than malignant neoplasm: Secondary | ICD-10-CM

## 2011-12-01 DIAGNOSIS — K7689 Other specified diseases of liver: Secondary | ICD-10-CM | POA: Insufficient documentation

## 2011-12-01 DIAGNOSIS — K219 Gastro-esophageal reflux disease without esophagitis: Secondary | ICD-10-CM | POA: Insufficient documentation

## 2011-12-01 MED ORDER — FLUDEOXYGLUCOSE F - 18 (FDG) INJECTION
18.7000 | Freq: Once | INTRAVENOUS | Status: AC | PRN
  Administered 2011-12-01: 18.7 via INTRAVENOUS

## 2011-12-01 NOTE — Progress Notes (Signed)
  Echocardiogram 2D Echocardiogram has been performed.  Mary [name] 12/01/2011, 1:05 PM

## 2011-12-02 ENCOUNTER — Encounter (HOSPITAL_BASED_OUTPATIENT_CLINIC_OR_DEPARTMENT_OTHER): Payer: Self-pay | Admitting: Anesthesiology

## 2011-12-02 ENCOUNTER — Encounter (HOSPITAL_BASED_OUTPATIENT_CLINIC_OR_DEPARTMENT_OTHER): Payer: Self-pay | Admitting: *Deleted

## 2011-12-02 ENCOUNTER — Ambulatory Visit (HOSPITAL_BASED_OUTPATIENT_CLINIC_OR_DEPARTMENT_OTHER): Payer: 59 | Admitting: Anesthesiology

## 2011-12-02 ENCOUNTER — Ambulatory Visit (HOSPITAL_COMMUNITY): Payer: 59

## 2011-12-02 ENCOUNTER — Encounter (HOSPITAL_BASED_OUTPATIENT_CLINIC_OR_DEPARTMENT_OTHER)
Admission: RE | Disposition: A | Discharge status: Home / Self Care | Payer: Self-pay | Source: Ambulatory Visit | Attending: General Surgery

## 2011-12-02 ENCOUNTER — Ambulatory Visit (HOSPITAL_BASED_OUTPATIENT_CLINIC_OR_DEPARTMENT_OTHER)
Admission: RE | Admit: 2011-12-02 | Discharge: 2011-12-02 | Disposition: A | Discharge status: Home / Self Care | Payer: 59 | Source: Ambulatory Visit | Attending: General Surgery | Admitting: General Surgery

## 2011-12-02 DIAGNOSIS — C50419 Malignant neoplasm of upper-outer quadrant of unspecified female breast: Secondary | ICD-10-CM | POA: Insufficient documentation

## 2011-12-02 DIAGNOSIS — C50919 Malignant neoplasm of unspecified site of unspecified female breast: Secondary | ICD-10-CM

## 2011-12-02 DIAGNOSIS — C50412 Malignant neoplasm of upper-outer quadrant of left female breast: Secondary | ICD-10-CM | POA: Diagnosis present

## 2011-12-02 HISTORY — DX: Gastro-esophageal reflux disease without esophagitis: K21.9

## 2011-12-02 HISTORY — PX: PORTACATH PLACEMENT: SHX2246

## 2011-12-02 HISTORY — DX: Dental restoration status: Z98.811

## 2011-12-02 LAB — GLUCOSE, CAPILLARY: Glucose-Capillary: 114 mg/dL — ABNORMAL HIGH (ref 70–99)

## 2011-12-02 SURGERY — INSERTION, TUNNELED CENTRAL VENOUS DEVICE, WITH PORT
Anesthesia: General | Site: Chest | Wound class: Clean

## 2011-12-02 MED ORDER — BUPIVACAINE-EPINEPHRINE PF 0.5-1:200000 % IJ SOLN
INTRAMUSCULAR | Status: DC | PRN
  Administered 2011-12-02: 10 mL

## 2011-12-02 MED ORDER — OXYCODONE HCL 5 MG PO TABS: 5.0000 mg | ORAL_TABLET | Freq: Once | ORAL | Status: DC | PRN

## 2011-12-02 MED ORDER — PROPOFOL 10 MG/ML IV BOLUS
INTRAVENOUS | Status: DC | PRN
  Administered 2011-12-02: 200 mg via INTRAVENOUS

## 2011-12-02 MED ORDER — ONDANSETRON HCL 4 MG/2ML IJ SOLN
INTRAMUSCULAR | Status: DC | PRN
  Administered 2011-12-02: 4 mg via INTRAVENOUS

## 2011-12-02 MED ORDER — CEFAZOLIN SODIUM-DEXTROSE 2-3 GM-% IV SOLR
2.0000 g | INTRAVENOUS | Status: AC
  Administered 2011-12-02: 2 g via INTRAVENOUS

## 2011-12-02 MED ORDER — OXYCODONE HCL 5 MG/5ML PO SOLN: 5.0000 mg | Freq: Once | ORAL | Status: DC | PRN

## 2011-12-02 MED ORDER — MIDAZOLAM HCL 5 MG/5ML IJ SOLN
INTRAMUSCULAR | Status: DC | PRN
  Administered 2011-12-02: 1 mg via INTRAVENOUS

## 2011-12-02 MED ORDER — HEPARIN SOD (PORK) LOCK FLUSH 100 UNIT/ML IV SOLN
INTRAVENOUS | Status: DC | PRN
  Administered 2011-12-02: 500 [IU]

## 2011-12-02 MED ORDER — FENTANYL CITRATE 0.05 MG/ML IJ SOLN
INTRAMUSCULAR | Status: DC | PRN
  Administered 2011-12-02: 75 ug via INTRAVENOUS

## 2011-12-02 MED ORDER — DEXAMETHASONE SODIUM PHOSPHATE 4 MG/ML IJ SOLN
INTRAMUSCULAR | Status: DC | PRN
  Administered 2011-12-02: 8 mg via INTRAVENOUS

## 2011-12-02 MED ORDER — HEPARIN (PORCINE) IN NACL 2-0.9 UNIT/ML-% IJ SOLN
INTRAMUSCULAR | Status: DC | PRN
  Administered 2011-12-02: 15 mL

## 2011-12-02 MED ORDER — FENTANYL CITRATE 0.05 MG/ML IJ SOLN: 25.0000 ug | INTRAMUSCULAR | Status: DC | PRN

## 2011-12-02 MED ORDER — CHLORHEXIDINE GLUCONATE 4 % EX LIQD: 1.0000 "application " | Freq: Once | CUTANEOUS | Status: DC

## 2011-12-02 MED ORDER — LIDOCAINE HCL (CARDIAC) 20 MG/ML IV SOLN
INTRAVENOUS | Status: DC | PRN
  Administered 2011-12-02: 40 mg via INTRAVENOUS

## 2011-12-02 MED ORDER — LACTATED RINGERS IV SOLN
INTRAVENOUS | Status: DC
  Administered 2011-12-02: 08:00:00 via INTRAVENOUS

## 2011-12-02 MED ORDER — HYDROCODONE-ACETAMINOPHEN 5-325 MG PO TABS: 1.0000 | ORAL_TABLET | ORAL | Status: AC | PRN

## 2011-12-02 SURGICAL SUPPLY — 54 items
BAG DECANTER FOR FLEXI CONT (MISCELLANEOUS) ×2 IMPLANT
BENZOIN TINCTURE PRP APPL 2/3 (GAUZE/BANDAGES/DRESSINGS) IMPLANT
BLADE HEX COATED 2.75 (ELECTRODE) ×2 IMPLANT
BLADE SURG 15 STRL LF DISP TIS (BLADE) ×2 IMPLANT
BLADE SURG 15 STRL SS (BLADE) ×2
CANISTER SUCTION 1200CC (MISCELLANEOUS) IMPLANT
CHLORAPREP W/TINT 26ML (MISCELLANEOUS) ×2 IMPLANT
CLOTH BEACON ORANGE TIMEOUT ST (SAFETY) ×2 IMPLANT
COVER MAYO STAND STRL (DRAPES) ×2 IMPLANT
COVER PROBE 5X48 (MISCELLANEOUS) ×1
COVER TABLE BACK 60X90 (DRAPES) ×2 IMPLANT
DECANTER SPIKE VIAL GLASS SM (MISCELLANEOUS) ×2 IMPLANT
DERMABOND ADVANCED (GAUZE/BANDAGES/DRESSINGS)
DERMABOND ADVANCED .7 DNX12 (GAUZE/BANDAGES/DRESSINGS) IMPLANT
DRAPE C-ARM 42X72 X-RAY (DRAPES) ×2 IMPLANT
DRAPE LAPAROSCOPIC ABDOMINAL (DRAPES) ×2 IMPLANT
DRAPE UTILITY XL STRL (DRAPES) ×2 IMPLANT
DRSG TEGADERM 2-3/8X2-3/4 SM (GAUZE/BANDAGES/DRESSINGS) IMPLANT
DRSG TEGADERM 4X10 (GAUZE/BANDAGES/DRESSINGS) IMPLANT
DRSG TEGADERM 4X4.75 (GAUZE/BANDAGES/DRESSINGS) IMPLANT
ELECT REM PT RETURN 9FT ADLT (ELECTROSURGICAL) ×2
ELECTRODE REM PT RTRN 9FT ADLT (ELECTROSURGICAL) ×1 IMPLANT
GAUZE SPONGE 4X4 12PLY STRL LF (GAUZE/BANDAGES/DRESSINGS) ×2 IMPLANT
GLOVE ECLIPSE 6.5 STRL STRAW (GLOVE) ×2 IMPLANT
GLOVE EUDERMIC 7 POWDERFREE (GLOVE) ×2 IMPLANT
GOWN PREVENTION PLUS XLARGE (GOWN DISPOSABLE) ×2 IMPLANT
GOWN PREVENTION PLUS XXLARGE (GOWN DISPOSABLE) ×2 IMPLANT
IV CATH PLACEMENT UNIT 16 GA (IV SOLUTION) IMPLANT
IV HEPARIN 1000UNITS/500ML (IV SOLUTION) ×2 IMPLANT
IV KIT MINILOC 20X1 SAFETY (NEEDLE) IMPLANT
KIT BARDPORT ISP (Port) IMPLANT
KIT CVR 48X5XPRB PLUP LF (MISCELLANEOUS) ×1 IMPLANT
KIT PORT POWER 8FR ISP CVUE (Catheter) ×2 IMPLANT
KIT PORT POWER ISP 8FR (Catheter) IMPLANT
KIT POWER CATH 8FR (Catheter) IMPLANT
NEEDLE BLUNT 17GA (NEEDLE) IMPLANT
NEEDLE HYPO 22GX1.5 SAFETY (NEEDLE) IMPLANT
NEEDLE HYPO 25X1 1.5 SAFETY (NEEDLE) ×2 IMPLANT
PACK BASIN DAY SURGERY FS (CUSTOM PROCEDURE TRAY) ×2 IMPLANT
PENCIL BUTTON HOLSTER BLD 10FT (ELECTRODE) ×2 IMPLANT
SET SHEATH INTRODUCER 10FR (MISCELLANEOUS) IMPLANT
SHEATH COOK PEEL AWAY SET 9F (SHEATH) IMPLANT
SLEEVE SCD COMPRESS KNEE MED (MISCELLANEOUS) IMPLANT
STRIP CLOSURE SKIN 1/2X4 (GAUZE/BANDAGES/DRESSINGS) IMPLANT
SUT MNCRL AB 4-0 PS2 18 (SUTURE) ×2 IMPLANT
SUT PROLENE 2 0 CT2 30 (SUTURE) ×4 IMPLANT
SUT VICRYL 3-0 CR8 SH (SUTURE) ×2 IMPLANT
SYR 5ML LUER SLIP (SYRINGE) ×2 IMPLANT
SYR CONTROL 10ML LL (SYRINGE) ×2 IMPLANT
TOWEL OR 17X24 6PK STRL BLUE (TOWEL DISPOSABLE) ×4 IMPLANT
TOWEL OR NON WOVEN STRL DISP B (DISPOSABLE) ×2 IMPLANT
TUBE CONNECTING 20X1/4 (TUBING) IMPLANT
WATER STERILE IRR 1000ML POUR (IV SOLUTION) ×2 IMPLANT
YANKAUER SUCT BULB TIP NO VENT (SUCTIONS) IMPLANT

## 2011-12-02 NOTE — Anesthesia Procedure Notes (Signed)
Procedure Name: LMA Insertion Performed by: Denilson Salminen, [place] Pre-anesthesia Checklist: Patient identified, Timeout performed, Emergency Drugs available, Suction available and Patient being monitored Patient Re-evaluated:Patient Re-evaluated prior to inductionOxygen Delivery Method: Circle system utilized Preoxygenation: Pre-oxygenation with 100% oxygen Intubation Type: IV induction Ventilation: Mask ventilation without difficulty LMA: LMA inserted LMA Size: 4.0 Number of attempts: 1 Placement Confirmation: breath sounds checked- equal and bilateral and positive ETCO2 Dental Injury: Teeth and Oropharynx as per pre-operative assessment      

## 2011-12-02 NOTE — Op Note (Signed)
Patient Name:           Mary [name]   Date of Surgery:        12/02/2011  Pre op Diagnosis:      Bilateral breast cancer, T3, N1 on the right, Tis on the left.  Post op Diagnosis:    Bilateral breast cancer  Procedure:                 Insertion of 8 French ClearView power port venous vascular access device, and utilizing fluoroscopic guidance and positioned in  Surgeon:                     Angelia Mould. Derrell Lolling, M.D., FACS  Assistant:                      none  Operative Indications:   Mary [name] is a 51 y.o. female. She was referred by Dr. Cain Saupe at the breast center of [facility] for evaluation and management of bilateral breast cancer.  She states that she has noticed a lump in her right breast since June of this year. She states that she had mammograms and [facility] which did not show anything other than fluid. She has not had any drainage from her breast. She does notice the right breast is somewhat smaller than the left now and she has noted nipple retraction on the right.  Ultimately she had imaging studies at the breast center [facility]. There is a large 8-10 cm mass centrally and superiorly in the right breast. Mammograms of the left breast showed calcifications in a focal area in the upper outer quadrant.  Image guided biopsy of the right breast shows mixed invasive and noninvasive disease. This may be a lobular cancer. ER 95%, PR 53%, Ki-67 85%, HER-2-negative. 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  MRI on 11/18/2011 shows abnormal right axillary lymph nodes, large, complex mass in the right breast in the upper central mass, nipple retraction, and biopsy changes in the left breast upper outer quadrant at least 1.3 cm in size. There was also a small liver lesion thought to be a cyst.  She has been seen in the [facility] by me, Dr. Drue Second, and Dr. Chipper Herb, and the treatment plan  that was decided was to begin with neoadjuvant chemotherapy, followed by definitive breast surgery. Her past history is fairly unremarkable. She did have a left breast biopsy for fibroadenoma 5 years ago..  Family history reveals breast cancer in a paternal aunt, ovarian cancer in a great aunt, pancreatic cancer in a paternal grandmother.   Operative Findings:       The catheter was inserted through the left subclavian vein. Catheter position looked good in the operating room with the catheter tip in the superior vena cava near the right atrial junction. Chest x-ray is pending  Procedure in Detail:          Following the induction of general anesthesia the patient was positioned with her arms at her sides a small roll behind her shoulders and her neck extended slightly. Intravenous antibiotics were given. The neck and chest were prepped and draped in a sterile fashion. Surgical time out was performed. 0.5% Marcaine with epinephrine was used as local infiltration anesthetic. A left subclavian venipuncture was performed with a single pass and a guidewire inserted into the superior vena cava. Fluoroscopic guidance was used to  confirm position of the wire. Fluoroscopic guidance was also used to draw a template of the chest wall to measure and position the catheter so that it would be correctly positioned in the distal superior vena cava. A small incision was made at the wire insertion site. A port pocket site was created with a transverse incision about 3 cm below the clavicle. Some subcutaneous tissue was debrided. A subcutaneous pocket was created down to the level of the pectoralis fascia. Using a tunneling device through the catheter from the port pocket site to the wire insertion site. Using the template on the chest wall I measured the catheter length and measured and cut at 22.5 cm. The catheter was secured to the port with a locking device and flushed with heparinized saline. The port was sutured to the  pectoralis fascia with 3 sutures of 2-0 Prolene. The dilator and peel-away sheath were inserted over the guidewire into the central venous circulation without difficulty. The dilator and wire were removed. The catheter was inserted through the peel-away sheath and  the peel-away sheath removed. We had excellent blood return and the catheter flushed easily. Fluoroscopy confirmed that the catheter was in the superior vena cava near the right atrium and there is no deformity of the catheter anywhere along its course. I flushed the catheter with concentrated heparin solution. There was no bleeding. The subcutaneous tissues were closed with 3-0 Vicryl sutures and the skin closed with a running subcuticular suture of 4-0 Monocryl and Dermabond. Patient tolerated procedure well was taken to recovery room where chest x-ray will be taken. EBL 10 cc. Counts correct. Complications none.     Angelia Mould. Derrell Lolling, M.D., FACS General and Minimally Invasive Surgery Breast and Colorectal Surgery  12/02/2011 9:28 AM

## 2011-12-02 NOTE — Transfer of Care (Signed)
Immediate Anesthesia Transfer of Care Note  Patient: Mary [name]  Procedure(s) Performed: Procedure(s) (LRB) with comments: INSERTION PORT-A-CATH (N/A) - insertion port a cath with fluoroscopy  Patient Location: PACU  Anesthesia Type: General  Level of Consciousness: awake and alert   Airway & Oxygen Therapy: Patient Spontanous Breathing and Patient connected to face mask oxygen  Post-op Assessment: Report given to PACU RN and Post -op Vital signs reviewed and stable  Post vital signs: Reviewed and stable  Complications: No apparent anesthesia complications

## 2011-12-02 NOTE — Anesthesia Preprocedure Evaluation (Signed)
Anesthesia Evaluation  Patient identified by MRN, date of birth, ID band Patient awake    Reviewed: Allergy & Precautions, H&P , NPO status , Patient's Chart, lab work & pertinent test results  Airway Mallampati: II TM Distance: >3 FB Neck ROM: Full    Dental No notable dental hx. (+) Teeth Intact and Dental Advisory Given   Pulmonary neg pulmonary ROS,  breath sounds clear to auscultation  Pulmonary exam normal       Cardiovascular negative cardio ROS  Rhythm:Regular Rate:Normal     Neuro/Psych negative neurological ROS  negative psych ROS   GI/Hepatic Neg liver ROS, GERD-  Medicated and Controlled,  Endo/Other  negative endocrine ROS  Renal/GU negative Renal ROS  negative genitourinary   Musculoskeletal   Abdominal   Peds  Hematology negative hematology ROS (+)   Anesthesia Other Findings   Reproductive/Obstetrics negative OB ROS                           Anesthesia Physical Anesthesia Plan  ASA: II  Anesthesia Plan: General   Post-op Pain Management:    Induction: Intravenous  Airway Management Planned: LMA  Additional Equipment:   Intra-op Plan:   Post-operative Plan: Extubation in OR  Informed Consent: I have reviewed the patients History and Physical, chart, labs and discussed the procedure including the risks, benefits and alternatives for the proposed anesthesia with the patient or authorized representative who has indicated his/her understanding and acceptance.   Dental advisory given  Plan Discussed with: CRNA and Surgeon  Anesthesia Plan Comments:         Anesthesia Quick Evaluation

## 2011-12-02 NOTE — Anesthesia Postprocedure Evaluation (Signed)
  Anesthesia Post-op Note  Patient: Mary [name]  Procedure(s) Performed: Procedure(s) (LRB) with comments: INSERTION PORT-A-CATH (N/A) - insertion port a cath with fluoroscopy  Patient Location: PACU  Anesthesia Type: General  Level of Consciousness: awake, alert  and oriented  Airway and Oxygen Therapy: Patient Spontanous Breathing  Post-op Pain: mild  Post-op Assessment: Post-op Vital signs reviewed, Patient's Cardiovascular Status Stable, Respiratory Function Stable, Patent Airway and No signs of Nausea or vomiting  Post-op Vital Signs: Reviewed and stable  Complications: No apparent anesthesia complications

## 2011-12-02 NOTE — Interval H&P Note (Signed)
History and Physical Interval Note:  12/02/2011 7:54 AM  Mary [name]  has presented today for surgery, with the diagnosis of bilateral breast cancer  The goals and the  various methods of treatment have been discussed with the patient and family. After consideration of risks, benefits and other options for treatment, the patient has consented to  Procedure(s) (LRB) with comments: INSERTION PORT-A-CATH (N/A) - insertion port a cath with fluoroscopy as a surgical intervention .  The patient's history has been reviewed, patient examined, no change in status, stable for surgery.  I have reviewed the patient's chart and labs.  Questions were answered to the patient's satisfaction.     Ernestene Mention

## 2011-12-05 ENCOUNTER — Telehealth: Payer: Self-pay | Admitting: *Deleted

## 2011-12-05 NOTE — Telephone Encounter (Signed)
Confirmed appt for f/u with Dr. Welton Flakes and 1st chemo on 9/19.  Instructed pt on application of EMLA cream.  Received verbal understanding.  Gave pt information on [facility] support groups d/t pt living in [place].

## 2011-12-06 ENCOUNTER — Encounter (HOSPITAL_BASED_OUTPATIENT_CLINIC_OR_DEPARTMENT_OTHER): Payer: Self-pay | Admitting: General Surgery

## 2011-12-08 ENCOUNTER — Other Ambulatory Visit (HOSPITAL_BASED_OUTPATIENT_CLINIC_OR_DEPARTMENT_OTHER): Payer: 59 | Admitting: Lab

## 2011-12-08 ENCOUNTER — Ambulatory Visit (HOSPITAL_BASED_OUTPATIENT_CLINIC_OR_DEPARTMENT_OTHER): Payer: 59 | Admitting: Oncology

## 2011-12-08 ENCOUNTER — Ambulatory Visit (HOSPITAL_BASED_OUTPATIENT_CLINIC_OR_DEPARTMENT_OTHER): Payer: 59

## 2011-12-08 ENCOUNTER — Encounter: Payer: Self-pay | Admitting: Oncology

## 2011-12-08 ENCOUNTER — Telehealth: Payer: Self-pay | Admitting: *Deleted

## 2011-12-08 VITALS — BP 127/83 | HR 76 | Temp 98.8°F | Resp 20 | Ht 64.0 in | Wt 160.8 lb

## 2011-12-08 DIAGNOSIS — Z5111 Encounter for antineoplastic chemotherapy: Secondary | ICD-10-CM

## 2011-12-08 DIAGNOSIS — C50419 Malignant neoplasm of upper-outer quadrant of unspecified female breast: Secondary | ICD-10-CM

## 2011-12-08 DIAGNOSIS — D0590 Unspecified type of carcinoma in situ of unspecified breast: Secondary | ICD-10-CM

## 2011-12-08 LAB — COMPREHENSIVE METABOLIC PANEL (CC13)
AST: 13 U/L (ref 5–34)
Albumin: 3.7 g/dL (ref 3.5–5.0)
BUN: 14 mg/dL (ref 7.0–26.0)
Calcium: 9.5 mg/dL (ref 8.4–10.4)
Chloride: 105 mEq/L (ref 98–107)
Creatinine: 0.8 mg/dL (ref 0.6–1.1)
Glucose: 96 mg/dl (ref 70–99)
Potassium: 4 mEq/L (ref 3.5–5.1)

## 2011-12-08 LAB — CBC WITH DIFFERENTIAL/PLATELET
BASO%: 0.3 % (ref 0.0–2.0)
Basophils Absolute: 0 10*3/uL (ref 0.0–0.1)
EOS%: 0.8 % (ref 0.0–7.0)
HCT: 42.6 % (ref 34.8–46.6)
HGB: 14.4 g/dL (ref 11.6–15.9)
MCH: 31.8 pg (ref 25.1–34.0)
MCHC: 33.8 g/dL (ref 31.5–36.0)
MCV: 94 fL (ref 79.5–101.0)
MONO%: 7.2 % (ref 0.0–14.0)
NEUT%: 75.1 % (ref 38.4–76.8)
RDW: 12.3 % (ref 11.2–14.5)
lymph#: 1.6 10*3/uL (ref 0.9–3.3)

## 2011-12-08 MED ORDER — SODIUM CHLORIDE 0.9 % IJ SOLN
10.0000 mL | INTRAMUSCULAR | Status: DC | PRN
  Administered 2011-12-08: 10 mL
  Filled 2011-12-08: qty 10

## 2011-12-08 MED ORDER — SODIUM CHLORIDE 0.9 % IV SOLN
150.0000 mg | Freq: Once | INTRAVENOUS | Status: AC
  Administered 2011-12-08: 150 mg via INTRAVENOUS
  Filled 2011-12-08: qty 5

## 2011-12-08 MED ORDER — PALONOSETRON HCL INJECTION 0.25 MG/5ML
0.2500 mg | Freq: Once | INTRAVENOUS | Status: AC
  Administered 2011-12-08: 0.25 mg via INTRAVENOUS

## 2011-12-08 MED ORDER — DEXAMETHASONE SODIUM PHOSPHATE 4 MG/ML IJ SOLN
12.0000 mg | Freq: Once | INTRAMUSCULAR | Status: AC
  Administered 2011-12-08: 12 mg via INTRAVENOUS

## 2011-12-08 MED ORDER — SODIUM CHLORIDE 0.9 % IV SOLN
500.0000 mg/m2 | Freq: Once | INTRAVENOUS | Status: AC
  Administered 2011-12-08: 900 mg via INTRAVENOUS
  Filled 2011-12-08: qty 45

## 2011-12-08 MED ORDER — HEPARIN SOD (PORK) LOCK FLUSH 100 UNIT/ML IV SOLN
500.0000 [IU] | Freq: Once | INTRAVENOUS | Status: AC | PRN
  Administered 2011-12-08: 500 [IU]
  Filled 2011-12-08: qty 5

## 2011-12-08 MED ORDER — EPIRUBICIN HCL CHEMO IV INJECTION 200 MG/100ML
180.0000 mg | Freq: Once | INTRAVENOUS | Status: AC
  Administered 2011-12-08: 180 mg via INTRAVENOUS
  Filled 2011-12-08: qty 90

## 2011-12-08 MED ORDER — SODIUM CHLORIDE 0.9 % IV SOLN
Freq: Once | INTRAVENOUS | Status: AC
  Administered 2011-12-08: 12:00:00 via INTRAVENOUS

## 2011-12-08 MED ORDER — FLUOROURACIL CHEMO INJECTION 2.5 GM/50ML
500.0000 mg/m2 | Freq: Once | INTRAVENOUS | Status: AC
  Administered 2011-12-08: 900 mg via INTRAVENOUS
  Filled 2011-12-08: qty 18

## 2011-12-08 NOTE — Telephone Encounter (Signed)
see kk q week x 10

## 2011-12-08 NOTE — Progress Notes (Signed)
OFFICE PROGRESS NOTE  CC  TULLO,TERESA, MD [address] [place] [address] Dr. Chipper Herb Dr. 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 Essentially patient has bilateral breast cancers.  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 It was recommended that patient proceed with neoadjuvant chemotherapy.  #2 patient also has had staging studies performed.  CURRENT THERAPY:Patient will begin FEC 100 dose dense with day 2 Neulasta today 12/08/2011.  INTERVAL HISTORY: Mary [name] 51 y.o. female returns forFollowup visit today. She has now turned 51. [place] Overall she's doing well she is without any significant problems I do think that she is doing very well with the knowledge of her diagnosis and she is very much ready to fight this disease. Today her family accompanies her. She is without any complaints. She has had her Port-A-Cath placed as well as has had her chemotherapy teaching and echocardiogram performed. She today denies any fevers chills night sweats headaches shortness of breath chest pains palpitations she has no myalgias or arthralgias. Remainder of the 10 point review of systems is negative.  MEDICAL HISTORY: Past Medical History  Diagnosis Date  . Irritable bowel syndrome   . GERD (gastroesophageal reflux disease)     Nexium as needed  . Breast cancer August 2013    bilateral  . Dental crowns present     x 4    ALLERGIES:   has no known allergies.  MEDICATIONS:  Current Outpatient Prescriptions  Medication Sig Dispense Refill  . ALPRAZolam (XANAX) 0.25 MG tablet Take 0.25 mg by mouth 3 (three) times daily as needed.       . Ascorbic Acid (VITAMIN C) 100 MG  tablet Take 100 mg by mouth daily.        Marland Kitchen azithromycin (ZITHROMAX) 500 MG tablet Take 1 tablet (500 mg total) by mouth daily.  7 tablet  0  . b complex vitamins tablet Take 1 tablet by mouth daily.       . calcium carbonate (OS-CAL) 600 MG TABS Take 600 mg by mouth 2 (two) times daily with a meal.        . cholecalciferol (VITAMIN D) 400 UNITS TABS Take by mouth.      . dexamethasone (DECADRON) 4 MG tablet Take 2 tablets by mouth once a day on the day after chemotherapy and then take 2 tablets two times a day for 2 days. Take with food.  30 tablet  1  . HYDROcodone-acetaminophen (NORCO/VICODIN) 5-325 MG per tablet Take 1-2 tablets by mouth every 4 (four) hours as needed for pain.  40 tablet  1  . lidocaine-prilocaine (EMLA) cream Apply topically as needed.  30 g  6  . loperamide (IMODIUM A-D) 2 MG tablet Take 2 mg by mouth 4 (four) times daily as needed.      Marland Kitchen LORazepam (ATIVAN) 0.5 MG tablet Take 1 tablet (0.5 mg total) by mouth every 6 (six) hours as needed (Nausea or vomiting).  30 tablet  0  . Multiple Vitamin (MULTIVITAMIN) tablet Take 1 tablet by mouth daily.        Marland Kitchen NEXIUM 40 MG capsule TAKE 1 CAPSULE BY MOUTH DAILY.  30 capsule  6  . ondansetron (ZOFRAN) 8 MG  tablet Take 1 tablet two times a day as needed for nausea or vomiting starting on the third day after chemotherapy.  30 tablet  1  . prochlorperazine (COMPAZINE) 10 MG tablet Take 1 tablet (10 mg total) by mouth every 6 (six) hours as needed (Nausea or vomiting).  30 tablet  1  . prochlorperazine (COMPAZINE) 25 MG suppository Place 1 suppository (25 mg total) rectally every 12 (twelve) hours as needed for nausea.  12 suppository  3  . spironolactone (ALDACTONE) 25 MG tablet Take 1 tablet (25 mg total) by mouth daily.  30 tablet  2  . vitamin B-12 (CYANOCOBALAMIN) 100 MCG tablet Take 50 mcg by mouth daily.          SURGICAL HISTORY:  Past Surgical History  Procedure Date  . Breast lumpectomy 2008    left  . Portacath placement  12/02/2011    Procedure: INSERTION PORT-A-CATH;  Surgeon: Ernestene Mention, MD;  Location: [facility];  Service: General;  Laterality: N/A;  insertion port a cath with fluoroscopy    REVIEW OF SYSTEMS:  Pertinent items are noted in HPI.   PHYSICAL EXAMINATION:  Well-developed nourished female in no acute distress she is a lipid nervousness. HEENT exam EOMI PERRLA sclerae anicteric no conjunctival pallor oral mucosa is moist neck is supple lungs are clear bilaterally to auscultation and percussion cardiovascular is regular rate rhythm no murmurs gallops or rubs abdomen is soft nontender nondistended bowel sounds are present no hepatosplenomegaly extremities no clubbing edema or cyanosis. Right breast reveals a significantly large palpable mass there are noted to be skin changes it is warm to touch. Left breast no masses or nipple discharge.  ECOG PERFORMANCE STATUS: 0 - Asymptomatic  Blood pressure 127/83, pulse 76, temperature 98.8 F (37.1 C), temperature source Oral, resp. rate 20, height 5\' 4"  (1.626 m), weight 160 lb 12.8 oz (72.938 kg), last menstrual period 11/24/2011.  LABORATORY DATA: Lab Results  Component Value Date   WBC 9.5 12/08/2011   HGB 14.4 12/08/2011   HCT 42.6 12/08/2011   MCV 94.0 12/08/2011   PLT 212 12/08/2011      Chemistry      Component Value Date/Time   NA 141 11/23/2011 1225   NA 140 05/20/2011 1204   K 4.5 11/23/2011 1225   K 5.2 05/20/2011 1204   CL 107 11/23/2011 1225   CL 108 05/20/2011 1204   CO2 23 11/23/2011 1225   CO2 22 05/20/2011 1204   BUN 13.0 11/23/2011 1225   BUN 10 05/20/2011 1204   CREATININE 0.9 11/23/2011 1225   CREATININE 0.82 05/20/2011 1204   CREATININE 0.9 01/18/2011 1202      Component Value Date/Time   CALCIUM 9.4 11/23/2011 1225   CALCIUM 8.6 05/20/2011 1204   ALKPHOS 90 11/23/2011 1225   ALKPHOS 80 05/20/2011 1204   AST 16 11/23/2011 1225   AST 21 05/20/2011 1204   ALT 19 11/23/2011 1225   ALT 29 05/20/2011 1204   BILITOT 0.30 11/23/2011 1225    BILITOT 0.3 05/20/2011 1204       RADIOGRAPHIC STUDIES:  Chest 2 View  11/30/2011  *RADIOLOGY REPORT*  Clinical Data: Preop, breast carcinoma  CHEST - 2 VIEW  Comparison: None.  Findings: The lungs are clear.  Mediastinal contours appear normal. The heart is within normal limits in size.  No bony abnormality is seen.  IMPRESSION: No active lung disease.   Original Report Authenticated By: Juline Patch, M.D.    US  Breast Right  11/14/2011  *RADIOLOGY REPORT*  Clinical Data:  The patient has noted a firm mass in the right upper outer quadrant associated with nipple retraction for several months.  DIGITAL DIAGNOSTIC RIGHT MAMMOGRAM WITH CAD AND RIGHT BREAST ULTRASOUND:  Comparison:  09/27/2011 and 03/16/2011 from [facility].  Findings:  The breast tissue is heterogeneously dense.  There is a "shrunken" appearance of the right breast parenchyma with retraction of the right nipple and thickening of the skin of the right nipple and areola.  Architectural distortion is noted in the right anterior upper outer quadrant without defined mass. Mammographic images were processed with CAD.  On physical exam, there is a 7-8 cm firm mass between 10 and 1 o'clock in the right breast, 2 cm from the nipple. There is retraction of the right nipple.  Ultrasound is performed, showing intense shadowing between 11 and 1 o'clock, 2 cm from the right nipple.  It is very difficult to measure defined masses within this area of shadowing.  Sonography of the right axilla demonstrates no definitively abnormal nodes.  The appearance is suspicious for invasive lobular carcinoma. Ultrasound-guided core needle biopsy is recommended.  The procedure was discussed with the patient and she agreed with this plan.  IMPRESSION: Highly suspicious "shrunken" right breast parenchyma with right nipple retraction in the right anterior breast between 10 and 1 o'clock.  Findings suggest invasive lobular carcinoma.  RECOMMENDATION:  Ultrasound-guided core needle biopsy is recommended.  This will be performed and reported separately.  Report was telephoned to [facility] at Dr. Melina Schools office.  BI-RADS CATEGORY 5:  Highly suggestive of malignancy - appropriate action should be taken.   Original Report Authenticated By: Daryl Eastern, M.D.    Mr Breast Bilateral W Wo Contrast  11/22/2011  **ADDENDUM** CREATED: 11/22/2011 12:53:17  T2 hyperintense hepatic lesion is most likely a cyst, but further evaluation could be performed with ultrasound or liver mass protocol MRI with contrast for further evaluation, unless there is outside prior imaging as previously evaluated this finding.  There is no similar examination within this Health Care System.  Addended by:  Harrel Lemon, M.D. on 11/22/2011 12:53:17.  **END ADDENDUM** SIGNED BY: Harrel Lemon, M.D.   11/22/2011  *RADIOLOGY REPORT*  Clinical Data: Recently diagnosed left breast upper outer quadrant high-grade DCIS manifesting as mammographically detected calcifications.  Invasive mammary carcinoma and DCIS in the right breast diagnosed as a palpable mass 11/14/2011.  BILATERAL BREAST MRI WITH AND WITHOUT CONTRAST  Technique: Multiplanar, multisequence MR images of both breasts were obtained prior to and following the intravenous administration of 15ml of Multihance.  Three dimensional images were evaluated at the independent DynaCad workstation.  Comparison:  Prior exams  Findings: Multiple abnormal right axillary lymph nodes with cortical thickening are noted, largest 8 mm in short axis diameter. There is minimal borderline prominence of several high left axillary lymph nodes but no abnormal morphology or cortical thickening according to imaging criteria.  At the dome of the right hepatic lobe, there is a 1 cm T2 hyperintense lesion which does not demonstrate internal enhancement allowing for technique.  There is right nipple retraction, diffuse right breast skin thickening, and skin and  trabecular edema.  No internal mammary artery chain lymphadenopathy.  Post biopsy changes noted in the left upper outer quadrant with clip artifact.  No other abnormal T2- weighted hyperintensity is identified on either side.  On postcontrast imaging, there is a moderate background type parenchymal enhancement pattern bilaterally.  This may decrease the sensitivity for detection of malignancy.  Allowing for this, there is a dominant confluent area of irregular mass-like enhancement predominately in the right upper breast demonstrating areas of washin/washout type enhancement kinetics, measuring approximately overall 9.3 x 8 6.3 x 5.6 cm (craniocaudad by transverse by anteroposterior dimension).  This corresponds to the area of biopsy- proven malignancy.  In the left breast upper outer quadrant, there is post biopsy change and clip artifact associated area of adjacent irregular nodular enhancement demonstrating washin/washout type enhancement kinetics, measuring 1.3 x 1.1 x 0.8 cm.  This does include the central biopsy cavity.  This corresponds to the area of biopsy- proven malignancy.  IMPRESSION: Multicentric abnormal right upper/central irregular mass-like enhancement, corresponding to the area of biopsy-proven breast cancer, with abnormal associated right axillary lymphadenopathy, right breast skin thickening, nipple retraction, and right breast skin/trabecular edema.  This suggests either secondary lymphedema or lymphangitic spread of tumor.  Left upper outer quadrant post biopsy change and irregular mass- like enhancement measuring 1.3 cm in maximal dimension, corresponding to the area of biopsy-proven DCIS.  The extent of disease could be underestimated given the background moderate enhancement type parenchymal pattern.  No other area of dominant abnormal enhancement in either breast.  RECOMMENDATION: Treatment plan  BI-RADS CATEGORY 6:  Known biopsy-proven malignancy - appropriate action should be taken.   THREE-DIMENSIONAL MR IMAGE RENDERING ON INDEPENDENT WORKSTATION:  Three-dimensional MR images were rendered by post-processing of the original MR data on an independent workstation.  The three- dimensional MR images were interpreted, and findings were reported in the accompanying complete MRI report for this study.   Original Report Authenticated By: Harrel Lemon, M.D.    Nm Pet Image Initial (pi) Skull Base To Thigh  12/01/2011  *RADIOLOGY REPORT*  Clinical Data: Initial treatment strategy for node positive breast cancer/staging.  Bilateral breast cancer. Right-sidedinvasive ductal carcinoma.  High-grade DCIS within the left upper outer quadrant.  NUCLEAR MEDICINE PET SKULL BASE TO THIGH  Fasting Blood Glucose:  114  Technique:  18.7 mCi F-18 FDG was injected intravenously. CT data was obtained and used for attenuation correction and anatomic localization only.  (This was not acquired as a diagnostic CT examination.) Additional exam technical data entered on technologist worksheet.  Comparison:  Plain film chest of 1 day prior and breast MR of 11/18/2011.  Findings:  Neck:  Extensive hypermetabolic "brown" fat within the low neck, paravertebral regions, and axilla.  No abnormal nodal activity within the neck.  Chest:  Ill-defined soft tissue fullness with multifocal areas of hypermetabolism within the right breast.  This measures a S.U.V. max of 7.9.  2 hypermetabolic right axillary nodes.  These measure up to 9 mm and a S.U.V. max of 3.2, including on image 69.  A right subpectoral node measures 4 mm on image 58 and may correspond to  mild hypermetabolism.  This measures a S.U.V. max of 2.5.  This is in the vicinity of the traversing vasculature.  Abdomen/Pelvis:  No suspicious areas of hypermetabolism to suggest metastatic disease.  There is urinary contamination about the perineum.  Skeleton:  No abnormal hypermetabolism.  CT  images performed for attenuation correction demonstrate no significant  findings within the neck.  Nonspecific right-sided thyroid nodule which measures 6 mm.  Hepatic dome 1.4 cm lesion on image 102 is not significantly hypermetabolic.  Normal urinary bladder.  Follicle within the left ovary.  IMPRESSION:  1.  Right breast primary with axillary nodal metastasis. Equivocal small right subpectoral  node.  2.  Mildly degraded exam secondary hypermetabolic "brown" fat within the low neck, paravertebral regions of the chest, and axillas. 3.  Hepatic dome lesion which is not significantly hypermetabolic. Favored to represent a benign entity such as a hemangioma or area of focal nodular hyperplasia.  This could be further characterized with pre and post contrast abdominal MRI (using Eovist) or reevaluated on follow-up.   Original Report Authenticated By: Consuello Bossier, M.D.    [place] Core Biopsy  11/15/2011  *RADIOLOGY REPORT*  Clinical Data:  Palpable mass in the right anterior upper outer quadrant.  "Shrunken" breast parenchyma in this location mammographically with intense shadowing noted sonographically.  ULTRASOUND GUIDED VACUUM ASSISTED CORE BIOPSY OF THE RIGHT BREAST  The patient and I discussed the procedure of ultrasound-guided biopsy, including benefits and alternatives.  We discussed the high likelihood of a successful procedure. We discussed the risks of the procedure including infection, bleeding, tissue injury, clip migration, and inadequate sampling.  Informed written consent was given.  Using sterile technique, 2% lidocaine, ultrasound guidance and a 12 gauge vacuum assisted needle, biopsy was performed of the palpable mass in the right anterior upper outer quadrant using a lateromedial approach.  At the conclusion of the procedure, a ribbon tissue marker clip was deployed into the biopsy cavity. Follow-up 2-view mammogram was performed and dictated separately.  IMPRESSION: Ultrasound-guided biopsy of a palpable mass in the right anterior upper outer quadrant.  No apparent  complications.   Original Report Authenticated By: Daryl Eastern, M.D.    Dg Chest Port 1 View  12/02/2011  *RADIOLOGY REPORT*  Clinical Data: History of breast cancer and port placement.  PORTABLE CHEST - 1 VIEW  Comparison: 11/30/2011  Findings: Portable semi upright view of the chest was obtained. There is a left subclavian Port-A-Cath.  The catheter tip is in the lower SVC.  Negative for pneumothorax.  Lungs are clear. Heart and mediastinum are within normal limits.  Trachea is midline.  IMPRESSION: Placement of left subclavian Port-A-Cath.  The tip is in the lower SVC.  Negative for pneumothorax.   Original Report Authenticated By: Richarda Overlie, M.D.    Mm Breast Stereo Biopsy Left  11/16/2011  *RADIOLOGY REPORT*  Clinical Data:  Suspicious calcifications in the left upper outer quadrant posteriorly.  STEREOTACTIC-GUIDED VACUUM ASSISTED BIOPSY OF THE LEFT BREAST AND SPECIMEN RADIOGRAPH  The patient and I discussed the procedure of stereotactic-guided biopsy, including benefits and alternatives.  We discussed the high likelihood of a successful procedure. We discussed the risks of the procedure, including infection, bleeding, tissue injury, clip migration, and inadequate sampling.  Informed written consent was given.  Using sterile technique, 2% lidocaine, stereotactic guidance, and a 9 gauge vacuum assisted device, biopsy was performed of the calcifications in the left upper outer quadrant posteriorly using a craniocaudal approach.  Specimen radiograph was performed, showing numerous calcifications within multiple core specimens.  Specimens with calcifications are identified for pathology.  At the conclusion of the procedure, a top hat shaped tissue marker clip was deployed into the biopsy cavity.  Follow-up 2-view mammogram confirmed clip placement and removal of many of the calcifications.  IMPRESSION: Stereotactic-guided biopsy of calcifications in the left upper outer quadrant posteriorly.  No  apparent complications.   Original Report Authenticated By: Daryl Eastern, M.D.    Mm Breast Surgical Specimen  11/16/2011  *RADIOLOGY REPORT*  Clinical Data:  Suspicious calcifications in the left upper outer quadrant posteriorly.  STEREOTACTIC-GUIDED VACUUM ASSISTED BIOPSY OF THE LEFT  BREAST AND SPECIMEN RADIOGRAPH  The patient and I discussed the procedure of stereotactic-guided biopsy, including benefits and alternatives.  We discussed the high likelihood of a successful procedure. We discussed the risks of the procedure, including infection, bleeding, tissue injury, clip migration, and inadequate sampling.  Informed written consent was given.  Using sterile technique, 2% lidocaine, stereotactic guidance, and a 9 gauge vacuum assisted device, biopsy was performed of the calcifications in the left upper outer quadrant posteriorly using a craniocaudal approach.  Specimen radiograph was performed, showing numerous calcifications within multiple core specimens.  Specimens with calcifications are identified for pathology.  At the conclusion of the procedure, a top hat shaped tissue marker clip was deployed into the biopsy cavity.  Follow-up 2-view mammogram confirmed clip placement and removal of many of the calcifications.  IMPRESSION: Stereotactic-guided biopsy of calcifications in the left upper outer quadrant posteriorly.  No apparent complications.   Original Report Authenticated By: Daryl Eastern, M.D.    Mm Digital Diagnostic Unilat L  11/15/2011  *RADIOLOGY REPORT*  Clinical Data:  Invasive and in situ mammary carcinoma with lobular features was diagnosed in the right breast on 11/14/2011.  Left mammogram is obtained today to correlate with scheduled breast MRI on 11/18/2011.  DIGITAL DIAGNOSTIC LEFT MAMMOGRAM WITH CAD  Comparison:  03/16/2011 [facility]  Findings:  The breast tissue is heterogeneously dense.  There are clustered pleomorphic microcalcifications in  the left upper outer quadrant posteriorly.  The appearance is suspicious and biopsy is suggested.   No mass or nonsurgical distortion is identified. Mammographic images were processed with CAD.  IMPRESSION: Suspicious microcalcifications in the left upper outer quadrant posteriorly.  RECOMMENDATION: Stereotactic core needle biopsy is recommended.  This is scheduled for 11/16/2011.  BI-RADS CATEGORY 4:  Suspicious abnormality - biopsy should be considered.   Original Report Authenticated By: Daryl Eastern, M.D.    Mm Digital Diagnostic Unilat R  11/14/2011  *RADIOLOGY REPORT*  Clinical Data:  Ultrasound-guided core needle biopsy of the right anterior upper outer quadrant with clip placement.  DIGITAL DIAGNOSTIC RIGHT MAMMOGRAM  Comparison:  Previous exams.  Findings:  Films are performed following ultrasound guided biopsy of the right anterior upper outer quadrant, 2 cm from the right nipple.  The InRad ribbon clip is appropriately positioned.  IMPRESSION: Appropriate clip placement following ultrasound-guided core needle biopsy of a suspicious mass in the right anterior upper outer quadrant.   Original Report Authenticated By: Daryl Eastern, M.D.    Mm Digital Diagnostic Unilat R  11/14/2011  *RADIOLOGY REPORT*  Clinical Data:  The patient has noted a firm mass in the right upper outer quadrant associated with nipple retraction for several months.  DIGITAL DIAGNOSTIC RIGHT MAMMOGRAM WITH CAD AND RIGHT BREAST ULTRASOUND:  Comparison:  09/27/2011 and 03/16/2011 from [facility].  Findings:  The breast tissue is heterogeneously dense.  There is a "shrunken" appearance of the right breast parenchyma with retraction of the right nipple and thickening of the skin of the right nipple and areola.  Architectural distortion is noted in the right anterior upper outer quadrant without defined mass. Mammographic images were processed with CAD.  On physical exam, there is a 7-8 cm firm mass  between 10 and 1 o'clock in the right breast, 2 cm from the nipple. There is retraction of the right nipple.  Ultrasound is performed, showing intense shadowing between 11 and 1 o'clock, 2 cm from the right nipple.  It is very difficult to  measure defined masses within this area of shadowing.  Sonography of the right axilla demonstrates no definitively abnormal nodes.  The appearance is suspicious for invasive lobular carcinoma. Ultrasound-guided core needle biopsy is recommended.  The procedure was discussed with the patient and she agreed with this plan.  IMPRESSION: Highly suspicious "shrunken" right breast parenchyma with right nipple retraction in the right anterior breast between 10 and 1 o'clock.  Findings suggest invasive lobular carcinoma.  RECOMMENDATION: Ultrasound-guided core needle biopsy is recommended.  This will be performed and reported separately.  Report was telephoned to [facility] at Dr. Melina Schools office.  BI-RADS CATEGORY 5:  Highly suggestive of malignancy - appropriate action should be taken.   Original Report Authenticated By: Daryl Eastern, M.D.    Mm Radiologist Eval And Mgmt  11/17/2011  *RADIOLOGY REPORT*  Clinical Data: Stereotactic core biopsy left breast calcifications.  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  This is found to be concordant with imaging findings.  I discussed the results with the patient and her mother and answered their questions.  The biopsy site is clean without complications.  The patient has appointment for MRI breast on November 18, 2011 and has an appointment for multidisciplinary clinic next Wednesday.  IMPRESSION: Post biopsy evaluation of left breast as described.   Original Report Authenticated By: Sherian Rein, M.D.    Mm Radiologist Eval And Mgmt  11/15/2011  *RADIOLOGY REPORT*  ESTABLISHED PATIENT OFFICE VISIT - LEVEL II [phone])  Chief  Complaint:  The patient returns for discussion of the pathologic findings after ultrasound-guided core needle biopsy of the right breast performed on 11/14/2011.  History:  The patient has noted a firm mass in the right subareolar region and right upper outer quadrant for several months with retraction of the right nipple.  Imaging evaluation demonstrated a suspicious shadowing mass in the right anterior upper outer quadrant. Ultrasound-guided core needle biopsy was performed.  Exam:  On physical examination, there is no sign of hematoma or infection at the biopsy site.  Pathology: Histologic evaluation demonstrates invasive and in situ mammary carcinoma with lobular features.  Assessment and Plan:  Results were discussed with the patient and her mother.  The patient requested consultation in [place].  She is scheduled to be seen in the [facility] at the regional cancer Center at [facility] on 11/23/2011.  Breast MRI is scheduled for 11/18/2011 at 4:45 p.m. questions were answered.  Educational materials were given.   Original Report Authenticated By: Daryl Eastern, M.D.    Dg Fluoro Guide Cv Line-no Report  12/02/2011  CLINICAL DATA: port   FLOURO GUIDE CV LINE  Fluoroscopy was utilized by the requesting physician.  No radiographic  interpretation.      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 Patient is now going to receive neoadjuvant chemotherapy consisting of FEC 100. She will receive dose dense FEC with day 2 Neulasta. She is overall doing well she understands the rationale for neoadjuvant treatment she understands the side effects of her chemotherapy. She also has her antiemetics already at her disposal to take as directed.  #2 she has had her echocardiogram which looks great.   PLAN:   #1 we will proceed with cycle 1 day 1 of FEC. She'll return tomorrow  for Neulasta.  #2  patient will return in one week's time for interim labs.   All questions were answered. The patient knows to call the clinic with any problems, questions or concerns. We can certainly see the patient much sooner if necessary.  I spent 25 minutes counseling the patient face to face. The total time spent in the appointment was 30 minutes.  Drue Second, MD Medical/Oncology [facility] [phone] (beeper) [phone] (Office)  12/08/2011, 11:28 AM

## 2011-12-08 NOTE — Patient Instructions (Addendum)
Proceed with chemotherapy today  Take your anti-emetics as directed  I will see you back in 1 week

## 2011-12-08 NOTE — Patient Instructions (Signed)
[facility] Discharge Instructions for Patients Receiving Chemotherapy  Today you received the following chemotherapy agents Ellence, Adrucil and Cytoxan.  To help prevent nausea and vomiting after your treatment, we encourage you to take your nausea medication. Begin taking your nausea medication as often as prescribed for by Dr. Khan.    If you develop nausea and vomiting that is not controlled by your nausea medication, call the clinic. If it is after clinic hours your family physician or the after hours number for the clinic or go to the Emergency Department.   BELOW ARE SYMPTOMS THAT SHOULD BE REPORTED IMMEDIATELY:  *FEVER GREATER THAN 100.5 F  *CHILLS WITH OR WITHOUT FEVER  NAUSEA AND VOMITING THAT IS NOT CONTROLLED WITH YOUR NAUSEA MEDICATION  *UNUSUAL SHORTNESS OF BREATH  *UNUSUAL BRUISING OR BLEEDING  TENDERNESS IN MOUTH AND THROAT WITH OR WITHOUT PRESENCE OF ULCERS  *URINARY PROBLEMS  *BOWEL PROBLEMS  UNUSUAL RASH Items with * indicate a potential emergency and should be followed up as soon as possible.  One of the nurses will contact you 24 hours after your treatment. Please let the nurse know about any problems that you may have experienced. Feel free to call the clinic you have any questions or concerns. The clinic phone number is [phone].   I have been informed and understand all the instructions given to me. I know to contact the clinic, my physician, or go to the Emergency Department if any problems should occur. I do not have any questions at this time, but understand that I may call the clinic during office hours   should I have any questions or need assistance in obtaining follow up care.    __________________________________________  _____________  __________ Signature of Patient or Authorized Representative            Date                   Time    __________________________________________ Nurse's Signature    

## 2011-12-09 ENCOUNTER — Telehealth: Payer: Self-pay | Admitting: *Deleted

## 2011-12-09 ENCOUNTER — Ambulatory Visit (HOSPITAL_BASED_OUTPATIENT_CLINIC_OR_DEPARTMENT_OTHER): Payer: 59

## 2011-12-09 VITALS — BP 123/70 | HR 79 | Temp 98.5°F

## 2011-12-09 DIAGNOSIS — C50419 Malignant neoplasm of upper-outer quadrant of unspecified female breast: Secondary | ICD-10-CM

## 2011-12-09 DIAGNOSIS — Z5189 Encounter for other specified aftercare: Secondary | ICD-10-CM

## 2011-12-09 MED ORDER — PEGFILGRASTIM INJECTION 6 MG/0.6ML
6.0000 mg | Freq: Once | SUBCUTANEOUS | Status: AC
  Administered 2011-12-09: 6 mg via SUBCUTANEOUS
  Filled 2011-12-09: qty 0.6

## 2011-12-09 NOTE — Telephone Encounter (Signed)
Patient and husband here for Neulasta injection following 1st fec chemotherapy treatment yesterday.  Patient states that she is feeling good.  No nausea, vomiting, or diarrhea.  Did have a little shakiness yesterday, which she felt was the steriods.  She is eating and drinking without difficulty.  Discussed use of Claritin and pain medicine for the side effects from the Neulasta.  She was able to teach back information to me.  Know to call if she has any problems, concerns, or questions.

## 2011-12-13 ENCOUNTER — Telehealth: Payer: Self-pay | Admitting: Genetic Counselor

## 2011-12-13 NOTE — Telephone Encounter (Signed)
Left good news message on mobile VM and asked that she call back.

## 2011-12-14 ENCOUNTER — Telehealth: Payer: Self-pay | Admitting: Genetic Counselor

## 2011-12-14 ENCOUNTER — Encounter: Payer: Self-pay | Admitting: Genetic Counselor

## 2011-12-14 NOTE — Telephone Encounter (Signed)
Left good news message on cell VM.  Asked for her to call me back.

## 2011-12-15 ENCOUNTER — Other Ambulatory Visit: Payer: Self-pay | Admitting: Medical Oncology

## 2011-12-15 ENCOUNTER — Telehealth: Payer: Self-pay | Admitting: *Deleted

## 2011-12-15 ENCOUNTER — Other Ambulatory Visit (HOSPITAL_BASED_OUTPATIENT_CLINIC_OR_DEPARTMENT_OTHER): Payer: 59 | Admitting: Lab

## 2011-12-15 ENCOUNTER — Ambulatory Visit (HOSPITAL_BASED_OUTPATIENT_CLINIC_OR_DEPARTMENT_OTHER): Payer: 59 | Admitting: Oncology

## 2011-12-15 ENCOUNTER — Encounter: Payer: Self-pay | Admitting: Oncology

## 2011-12-15 VITALS — BP 147/81 | HR 78 | Temp 98.6°F | Resp 20 | Ht 64.0 in | Wt 160.1 lb

## 2011-12-15 DIAGNOSIS — R5381 Other malaise: Secondary | ICD-10-CM

## 2011-12-15 DIAGNOSIS — D0590 Unspecified type of carcinoma in situ of unspecified breast: Secondary | ICD-10-CM

## 2011-12-15 DIAGNOSIS — C50419 Malignant neoplasm of upper-outer quadrant of unspecified female breast: Secondary | ICD-10-CM

## 2011-12-15 DIAGNOSIS — D702 Other drug-induced agranulocytosis: Secondary | ICD-10-CM

## 2011-12-15 HISTORY — DX: Other drug-induced agranulocytosis: D70.2

## 2011-12-15 LAB — CBC WITH DIFFERENTIAL/PLATELET
Eosinophils Absolute: 0.1 10*3/uL (ref 0.0–0.5)
LYMPH%: 75.1 % — ABNORMAL HIGH (ref 14.0–49.7)
MCHC: 33.9 g/dL (ref 31.5–36.0)
MCV: 96.1 fL (ref 79.5–101.0)
MONO%: 3.8 % (ref 0.0–14.0)
NEUT#: 0.1 10*3/uL — CL (ref 1.5–6.5)
Platelets: 83 10*3/uL — ABNORMAL LOW (ref 145–400)
RBC: 4.29 10*6/uL (ref 3.70–5.45)

## 2011-12-15 LAB — COMPREHENSIVE METABOLIC PANEL (CC13)
Alkaline Phosphatase: 103 U/L (ref 40–150)
Glucose: 105 mg/dl — ABNORMAL HIGH (ref 70–99)
Sodium: 137 mEq/L (ref 136–145)
Total Bilirubin: 0.4 mg/dL (ref 0.20–1.20)
Total Protein: 6.7 g/dL (ref 6.4–8.3)

## 2011-12-15 MED ORDER — CIPROFLOXACIN HCL 500 MG PO TABS: 500.0000 mg | ORAL_TABLET | Freq: Two times a day (BID) | ORAL | Status: DC

## 2011-12-15 MED ORDER — MISC. DEVICES MISC: 1.0000 | Freq: Every day | Status: DC

## 2011-12-15 NOTE — Patient Instructions (Addendum)
You are neutropenic and are at risk for infections.   You will begin cipro (antibiotic) 500 mg twice a day starting today for 7 days.  Follow the instructions below for neutropenia   Patient Neutropenia Instruction Sheet  Diagnosis: Breast Cancer      Treating Physician: Drue Second, MD  Treatment: 1. Type of chemotherapy: FEC 2. Date of last treatment: 9/19  Last Blood Counts: Lab Results  Component Value Date   WBC 1.1* 12/15/2011   HGB 14.0 12/15/2011   HCT 41.2 12/15/2011   MCV 96.1 12/15/2011   PLT 83* 12/15/2011        Prophylactic Antibiotics: Cipro 500 mg by mouth twice a day Instructions: 1. Monitor temperature and call if fever  greater than 100.5, chills, shaking chills (rigors) 2. Call Physician on-call at [phone] 3. Give him/her symptoms and list of medications that you are taking and your last blood count.   I will see you back in 1 week as scheduled

## 2011-12-15 NOTE — Telephone Encounter (Signed)
As of 12-15-2011 nothing needs to be scheduled 

## 2011-12-21 NOTE — Progress Notes (Signed)
OFFICE PROGRESS NOTE  CC  TULLO,TERESA, MD [address] [place] [address] Dr. Chipper Herb Dr. 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 Essentially patient has bilateral breast cancers.  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 It was recommended that patient proceed with neoadjuvant chemotherapy.  #2 patient has begun FEC 100 starting on 12/08/2011. A total of 4 cycles of this chemotherapy is planned and then thereafter she will proceed with Taxol as a single agent for total of 12 weeks.  CURRENT THERAPY status post cycle 1 of FEC on 12/08/2011  INTERVAL HISTORY: Mary [name] 51 y.o. female returns for Followup visit today. Clinically she seems to be doing well although she does tell me she is fatigued. She was also neutropenic with an ANC of 0.1 total white count is 1.1. However she has not had any fevers chills or night sweats. She denies any mouth sores no diarrhea or constipation no myalgias or arthralgias no peripheral paresthesias no shortness of breath or chest pains she is now gone the pain remainder of the 10 point review of systems is negative.  MEDICAL HISTORY: Past Medical History  Diagnosis Date  . Irritable bowel syndrome   . GERD (gastroesophageal reflux disease)     Nexium as needed  . Breast cancer August 2013    bilateral  . Dental crowns present     x 4  . Neutropenia, drug-induced 12/15/2011    ALLERGIES:   has no known allergies.  MEDICATIONS:  Current Outpatient Prescriptions  Medication Sig Dispense Refill  . ALPRAZolam (XANAX) 0.25 MG tablet Take 0.25 mg by mouth 3 (three) times daily as needed.       . Ascorbic Acid (VITAMIN C) 100 MG tablet Take 100 mg by mouth daily.         Marland Kitchen azithromycin (ZITHROMAX) 500 MG tablet Take 1 tablet (500 mg total) by mouth daily.  7 tablet  0  . b complex vitamins tablet Take 1 tablet by mouth daily.       . calcium carbonate (OS-CAL) 600 MG TABS Take 600 mg by mouth 2 (two) times daily with a meal.        . cholecalciferol (VITAMIN D) 400 UNITS TABS Take by mouth.      . dexamethasone (DECADRON) 4 MG tablet Take 2 tablets by mouth once a day on the day after chemotherapy and then take 2 tablets two times a day for 2 days. Take with food.  30 tablet  1  . lidocaine-prilocaine (EMLA) cream Apply topically as needed.  30 g  6  . loperamide (IMODIUM A-D) 2 MG tablet Take 2 mg by mouth 4 (four) times daily as needed.      Marland Kitchen LORazepam (ATIVAN) 0.5 MG tablet Take 1 tablet (0.5 mg total) by mouth every 6 (six) hours as needed (Nausea or vomiting).  30 tablet  0  . Multiple Vitamin (MULTIVITAMIN) tablet Take 1 tablet by mouth daily.        Marland Kitchen NEXIUM 40 MG capsule TAKE 1 CAPSULE BY MOUTH DAILY.  30 capsule  6  . ondansetron (ZOFRAN) 8 MG tablet Take 1 tablet two times a day as needed for nausea or vomiting starting on the third day after chemotherapy.  30 tablet  1  . prochlorperazine (COMPAZINE) 10 MG tablet Take 1 tablet (10 mg total) by mouth every 6 (six) hours as needed (Nausea or vomiting).  30 tablet  1  . prochlorperazine (COMPAZINE) 25 MG suppository Place 1 suppository (25 mg total) rectally every 12 (twelve) hours as needed for nausea.  12 suppository  3  . spironolactone (ALDACTONE) 25 MG tablet Take 1 tablet (25 mg total) by mouth daily.  30 tablet  2  . vitamin B-12 (CYANOCOBALAMIN) 100 MCG tablet Take 50 mcg by mouth daily.        . ciprofloxacin (CIPRO) 500 MG tablet Take 1 tablet (500 mg total) by mouth 2 (two) times daily.  14 tablet  6  . Misc. Devices MISC 1 each by Does not apply route daily.  1 each  0    SURGICAL HISTORY:  Past Surgical History  Procedure Date  . Breast lumpectomy 2008    left  . Portacath placement  12/02/2011    Procedure: INSERTION PORT-A-CATH;  Surgeon: Ernestene Mention, MD;  Location: [facility];  Service: General;  Laterality: N/A;  insertion port a cath with fluoroscopy    REVIEW OF SYSTEMS:  Pertinent items are noted in HPI.   PHYSICAL EXAMINATION:  Well-developed nourished female in no acute distress she is a lipid nervousness. HEENT exam EOMI PERRLA sclerae anicteric no conjunctival pallor oral mucosa is moist neck is supple lungs are clear bilaterally to auscultation and percussion cardiovascular is regular rate rhythm no murmurs gallops or rubs abdomen is soft nontender nondistended bowel sounds are present no hepatosplenomegaly extremities no clubbing edema or cyanosis. Right breast reveals a significantly large palpable mass there are noted to be skin changes it is warm to touch. Left breast no masses or nipple discharge.  ECOG PERFORMANCE STATUS: 0 - Asymptomatic  Blood pressure 147/81, pulse 78, temperature 98.6 F (37 C), temperature source Oral, resp. rate 20, height 5\' 4"  (1.626 m), weight 160 lb 1.6 oz (72.621 kg), last menstrual period 11/24/2011.  LABORATORY DATA: Lab Results  Component Value Date   WBC 1.1* 12/15/2011   HGB 14.0 12/15/2011   HCT 41.2 12/15/2011   MCV 96.1 12/15/2011   PLT 83* 12/15/2011      Chemistry      Component Value Date/Time   NA 137 12/15/2011 1027   NA 140 05/20/2011 1204   K 3.8 12/15/2011 1027   K 5.2 05/20/2011 1204   CL 105 12/15/2011 1027   CL 108 05/20/2011 1204   CO2 21* 12/15/2011 1027   CO2 22 05/20/2011 1204   BUN 15.0 12/15/2011 1027   BUN 10 05/20/2011 1204   CREATININE 0.8 12/15/2011 1027   CREATININE 0.82 05/20/2011 1204   CREATININE 0.9 01/18/2011 1202      Component Value Date/Time   CALCIUM 9.3 12/15/2011 1027   CALCIUM 8.6 05/20/2011 1204   ALKPHOS 103 12/15/2011 1027   ALKPHOS 80 05/20/2011 1204   AST 9 12/15/2011 1027   AST 21 05/20/2011 1204   ALT 12 12/15/2011 1027   ALT 29 05/20/2011 1204   BILITOT 0.40  12/15/2011 1027   BILITOT 0.3 05/20/2011 1204       RADIOGRAPHIC STUDIES:  ASSESSMENT: 51 year old female with  #1 T3, N1, 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 Patient is now going to receive neoadjuvant chemotherapy consisting of FEC 100. She will receive dose dense FEC with day 2 Neulasta. She is overall  doing well she understands the rationale for neoadjuvant treatment she understands the side effects of her chemotherapy. She also has her antiemetics already at her disposal to take as directed.  #2 patient is status post cycle 1 of FEC 100 on 12/08/2011. Overall I think she has tolerated it well but she has become neutropenic.  PLAN:  #1 patient will begin Cipro 500 mg twice a day prophylactically to help prevent infections.  #2 she will monitor her temperatures and call us as needed. Neutropenic precaution worksheet has been given to her as well as her latest blood counts.  #3 patient will be seen back in 1 weeks time in followup.  All questions were answered. The patient knows to call the clinic with any problems, questions or concerns. We can certainly see the patient much sooner if necessary.  I spent 25 minutes counseling the patient face to face. The total time spent in the appointment was 30 minutes.  Drue Second, MD Medical/Oncology [facility] [phone] (beeper) [phone] (Office)  12/21/2011, 8:38 AM

## 2011-12-22 ENCOUNTER — Telehealth: Payer: Self-pay | Admitting: *Deleted

## 2011-12-22 ENCOUNTER — Ambulatory Visit (HOSPITAL_BASED_OUTPATIENT_CLINIC_OR_DEPARTMENT_OTHER): Payer: 59 | Admitting: Oncology

## 2011-12-22 ENCOUNTER — Encounter: Payer: Self-pay | Admitting: Oncology

## 2011-12-22 ENCOUNTER — Other Ambulatory Visit (HOSPITAL_BASED_OUTPATIENT_CLINIC_OR_DEPARTMENT_OTHER): Payer: 59 | Admitting: Lab

## 2011-12-22 ENCOUNTER — Ambulatory Visit (HOSPITAL_BASED_OUTPATIENT_CLINIC_OR_DEPARTMENT_OTHER): Payer: 59

## 2011-12-22 ENCOUNTER — Other Ambulatory Visit: Payer: Self-pay | Admitting: *Deleted

## 2011-12-22 VITALS — BP 137/77 | HR 81 | Temp 98.1°F | Resp 20 | Ht 64.0 in | Wt 160.8 lb

## 2011-12-22 DIAGNOSIS — D0590 Unspecified type of carcinoma in situ of unspecified breast: Secondary | ICD-10-CM

## 2011-12-22 DIAGNOSIS — C50919 Malignant neoplasm of unspecified site of unspecified female breast: Secondary | ICD-10-CM

## 2011-12-22 DIAGNOSIS — C50419 Malignant neoplasm of upper-outer quadrant of unspecified female breast: Secondary | ICD-10-CM

## 2011-12-22 DIAGNOSIS — Z5111 Encounter for antineoplastic chemotherapy: Secondary | ICD-10-CM

## 2011-12-22 LAB — COMPREHENSIVE METABOLIC PANEL (CC13)
ALT: 20 U/L (ref 0–55)
AST: 16 U/L (ref 5–34)
Calcium: 9 mg/dL (ref 8.4–10.4)
Chloride: 107 mEq/L (ref 98–107)
Creatinine: 0.8 mg/dL (ref 0.6–1.1)
Sodium: 139 mEq/L (ref 136–145)
Total Protein: 6.7 g/dL (ref 6.4–8.3)

## 2011-12-22 LAB — CBC WITH DIFFERENTIAL/PLATELET
BASO%: 0.7 % (ref 0.0–2.0)
Eosinophils Absolute: 0 10*3/uL (ref 0.0–0.5)
LYMPH%: 18.2 % (ref 14.0–49.7)
MCHC: 33.7 g/dL (ref 31.5–36.0)
MONO#: 0.7 10*3/uL (ref 0.1–0.9)
NEUT#: 6.2 10*3/uL (ref 1.5–6.5)
Platelets: 224 10*3/uL (ref 145–400)
RBC: 3.95 10*6/uL (ref 3.70–5.45)
RDW: 11.9 % (ref 11.2–14.5)
WBC: 8.5 10*3/uL (ref 3.9–10.3)
lymph#: 1.5 10*3/uL (ref 0.9–3.3)
nRBC: 0 % (ref 0–0)

## 2011-12-22 MED ORDER — HEPARIN SOD (PORK) LOCK FLUSH 100 UNIT/ML IV SOLN
500.0000 [IU] | Freq: Once | INTRAVENOUS | Status: AC | PRN
  Administered 2011-12-22: 500 [IU]
  Filled 2011-12-22: qty 5

## 2011-12-22 MED ORDER — FOSAPREPITANT DIMEGLUMINE INJECTION 150 MG
150.0000 mg | Freq: Once | INTRAVENOUS | Status: AC
  Administered 2011-12-22: 150 mg via INTRAVENOUS
  Filled 2011-12-22: qty 5

## 2011-12-22 MED ORDER — PALONOSETRON HCL INJECTION 0.25 MG/5ML
0.2500 mg | Freq: Once | INTRAVENOUS | Status: AC
  Administered 2011-12-22: 0.25 mg via INTRAVENOUS

## 2011-12-22 MED ORDER — FLUOROURACIL CHEMO INJECTION 2.5 GM/50ML
500.0000 mg/m2 | Freq: Once | INTRAVENOUS | Status: AC
  Administered 2011-12-22: 900 mg via INTRAVENOUS
  Filled 2011-12-22: qty 18

## 2011-12-22 MED ORDER — EPIRUBICIN HCL CHEMO IV INJECTION 200 MG/100ML
180.0000 mg | Freq: Once | INTRAVENOUS | Status: AC
  Administered 2011-12-22: 180 mg via INTRAVENOUS
  Filled 2011-12-22: qty 90

## 2011-12-22 MED ORDER — SODIUM CHLORIDE 0.9 % IV SOLN
500.0000 mg/m2 | Freq: Once | INTRAVENOUS | Status: AC
  Administered 2011-12-22: 900 mg via INTRAVENOUS
  Filled 2011-12-22: qty 45

## 2011-12-22 MED ORDER — SODIUM CHLORIDE 0.9 % IJ SOLN
10.0000 mL | INTRAMUSCULAR | Status: DC | PRN
  Administered 2011-12-22: 10 mL
  Filled 2011-12-22: qty 10

## 2011-12-22 MED ORDER — SODIUM CHLORIDE 0.9 % IV SOLN
Freq: Once | INTRAVENOUS | Status: AC
  Administered 2011-12-22: 11:00:00 via INTRAVENOUS

## 2011-12-22 MED ORDER — DEXAMETHASONE SODIUM PHOSPHATE 4 MG/ML IJ SOLN
12.0000 mg | Freq: Once | INTRAMUSCULAR | Status: AC
  Administered 2011-12-22: 12 mg via INTRAVENOUS

## 2011-12-22 NOTE — Patient Instructions (Addendum)
Proceed with chemotherapy today  I will see you back in 1 week 

## 2011-12-22 NOTE — Patient Instructions (Addendum)
[facility] Discharge Instructions for Patients Receiving Chemotherapy  Today you received the following chemotherapy agents Ellence, Cytoxan and 5-FU.  To help prevent nausea and vomiting after your treatment, we encourage you to take your nausea medication as ordered per MD.  Take Ativan (lorazepam) and Compazine (prochloraperzine) as directed.  Start Zofran (ondansetron) on third day after chemotherapy twice daily as needed for nausea.  Take Dexamethasone as prescribed.    If you develop nausea and vomiting that is not controlled by your nausea medication, call the clinic. If it is after clinic hours your family physician or the after hours number for the clinic or go to the Emergency Department.   BELOW ARE SYMPTOMS THAT SHOULD BE REPORTED IMMEDIATELY:  *FEVER GREATER THAN 100.5 F  *CHILLS WITH OR WITHOUT FEVER  NAUSEA AND VOMITING THAT IS NOT CONTROLLED WITH YOUR NAUSEA MEDICATION  *UNUSUAL SHORTNESS OF BREATH  *UNUSUAL BRUISING OR BLEEDING  TENDERNESS IN MOUTH AND THROAT WITH OR WITHOUT PRESENCE OF ULCERS  *URINARY PROBLEMS  *BOWEL PROBLEMS  UNUSUAL RASH Items with * indicate a potential emergency and should be followed up as soon as possible.  Feel free to call the clinic you have any questions or concerns. The clinic phone number is [phone].   I have been informed and understand all the instructions given to me. I know to contact the clinic, my physician, or go to the Emergency Department if any problems should occur. I do not have any questions at this time, but understand that I may call the clinic during office hours   should I have any questions or need assistance in obtaining follow up care.    __________________________________________  _____________  __________ Signature of Patient or Authorized Representative            Date                   Time    __________________________________________ Nurse's Signature

## 2011-12-22 NOTE — Telephone Encounter (Signed)
12-29-2011 starting at 12:15pm with labs

## 2011-12-22 NOTE — Progress Notes (Signed)
OFFICE PROGRESS NOTE  CC  TULLO,TERESA, MD [address] [place] [address] Dr. Chipper Herb Dr. 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 Essentially patient has bilateral breast cancers.  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 It was recommended that patient proceed with neoadjuvant chemotherapy.  #2 patient has begun FEC 100 starting on 12/08/2011. A total of 4 cycles of this chemotherapy is planned and then thereafter she will proceed with Taxol as a single agent for total of 12 weeks.  CURRENT THERAPY here for cycle #2 of FEC with day 2 Neulasta  INTERVAL HISTORY: Mary [name] 51 y.o. female returns for Followup visit today. Overall she is doing well counts have recovered with a normal white count as well as platelets. She feels well feels much stronger. She has noted that her hair is thinning out. She otherwise denies any nausea vomiting fevers chills night sweats she denies any headaches. She continues to work full-time. She has no vaginal discharge no bleeding. Remainder of the 10 point review of systems is negative. MEDICAL HISTORY: Past Medical History  Diagnosis Date  . Irritable bowel syndrome   . GERD (gastroesophageal reflux disease)     Nexium as needed  . Breast cancer August 2013    bilateral  . Dental crowns present     x 4  . Neutropenia, drug-induced 12/15/2011    ALLERGIES:   has no known allergies.  MEDICATIONS:  Current Outpatient Prescriptions  Medication Sig Dispense Refill  . ALPRAZolam (XANAX) 0.25 MG tablet Take 0.25 mg by mouth 3 (three) times daily as needed.       . Ascorbic Acid (VITAMIN C) 100 MG tablet Take 100 mg by mouth daily.        Marland Kitchen b complex vitamins tablet  Take 1 tablet by mouth daily.       . calcium carbonate (OS-CAL) 600 MG TABS Take 600 mg by mouth 2 (two) times daily with a meal.        . cholecalciferol (VITAMIN D) 400 UNITS TABS Take by mouth.      . ciprofloxacin (CIPRO) 500 MG tablet Take 1 tablet (500 mg total) by mouth 2 (two) times daily.  14 tablet  6  . dexamethasone (DECADRON) 4 MG tablet Take 2 tablets by mouth once a day on the day after chemotherapy and then take 2 tablets two times a day for 2 days. Take with food.  30 tablet  1  . lidocaine-prilocaine (EMLA) cream Apply topically as needed.  30 g  6  . LORazepam (ATIVAN) 0.5 MG tablet Take 1 tablet (0.5 mg total) by mouth every 6 (six) hours as needed (Nausea or vomiting).  30 tablet  0  . Multiple Vitamin (MULTIVITAMIN) tablet Take 1 tablet by mouth daily.        Marland Kitchen NEXIUM 40 MG capsule TAKE 1 CAPSULE BY MOUTH DAILY.  30 capsule  6  . ondansetron (ZOFRAN) 8 MG tablet Take 1 tablet two times a day as needed for nausea or vomiting starting on the third day after chemotherapy.  30 tablet  1  . prochlorperazine (COMPAZINE) 10 MG tablet Take 1 tablet (10 mg total) by mouth every 6 (six) hours as needed (Nausea or vomiting).  30  tablet  1  . vitamin B-12 (CYANOCOBALAMIN) 100 MCG tablet Take 50 mcg by mouth daily.        Marland Kitchen loperamide (IMODIUM A-D) 2 MG tablet Take 2 mg by mouth 4 (four) times daily as needed.      . Misc. Devices MISC 1 each by Does not apply route daily.  1 each  0  . prochlorperazine (COMPAZINE) 25 MG suppository Place 1 suppository (25 mg total) rectally every 12 (twelve) hours as needed for nausea.  12 suppository  3    SURGICAL HISTORY:  Past Surgical History  Procedure Date  . Breast lumpectomy 2008    left  . Portacath placement 12/02/2011    Procedure: INSERTION PORT-A-CATH;  Surgeon: Ernestene Mention, MD;  Location:  [facility]ERY [facility];  Service: General;  Laterality: N/A;  insertion port a cath with fluoroscopy    REVIEW OF SYSTEMS:  Pertinent  items are noted in HPI.   PHYSICAL EXAMINATION:  Well-developed nourished female in no acute distress she is a lipid nervousness. HEENT exam EOMI PERRLA sclerae anicteric no conjunctival pallor oral mucosa is moist neck is supple lungs are clear bilaterally to auscultation and percussion cardiovascular is regular rate rhythm no murmurs gallops or rubs abdomen is soft nontender nondistended bowel sounds are present no hepatosplenomegaly extremities no clubbing edema or cyanosis. Right breast reveals a significantly large palpable mass there are noted to be skin changes it is warm to touch. Left breast no masses or nipple discharge.  ECOG PERFORMANCE STATUS: 0 - Asymptomatic  Blood pressure 137/77, pulse 81, temperature 98.1 F (36.7 C), temperature source Oral, resp. rate 20, height 5\' 4"  (1.626 m), weight 160 lb 12.8 oz (72.938 kg), last menstrual period 11/24/2011.  LABORATORY DATA: Lab Results  Component Value Date   WBC 8.5 12/22/2011   HGB 12.5 12/22/2011   HCT 37.1 12/22/2011   MCV 93.9 12/22/2011   PLT 224 12/22/2011      Chemistry      Component Value Date/Time   NA 137 12/15/2011 1027   NA 140 05/20/2011 1204   K 3.8 12/15/2011 1027   K 5.2 05/20/2011 1204   CL 105 12/15/2011 1027   CL 108 05/20/2011 1204   CO2 21* 12/15/2011 1027   CO2 22 05/20/2011 1204   BUN 15.0 12/15/2011 1027   BUN 10 05/20/2011 1204   CREATININE 0.8 12/15/2011 1027   CREATININE 0.82 05/20/2011 1204   CREATININE 0.9 01/18/2011 1202      Component Value Date/Time   CALCIUM 9.3 12/15/2011 1027   CALCIUM 8.6 05/20/2011 1204   ALKPHOS 103 12/15/2011 1027   ALKPHOS 80 05/20/2011 1204   AST 9 12/15/2011 1027   AST 21 05/20/2011 1204   ALT 12 12/15/2011 1027   ALT 29 05/20/2011 1204   BILITOT 0.40 12/15/2011 1027   BILITOT 0.3 05/20/2011 1204       RADIOGRAPHIC STUDIES:  ASSESSMENT: 51 year old female with  #1 T3, N1, 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 Patient is now going to  receive neoadjuvant chemotherapy consisting of FEC 100. She will receive dose dense FEC with day 2 Neulasta. She is overall doing well she understands the rationale for neoadjuvant treatment she understands the side effects of her chemotherapy. She also has her antiemetics already at her disposal to take as directed.  #2 patient will proceed with cycle #2 of FEC with day 2 Neulasta tomorrow.  PLAN:  #1 proceed with scheduled  chemotherapy today. She knows how to take her antiemetics.  #2 she will return tomorrow for day 2 Neulasta. She knows to take Claritin and Tylenol prior to her visit tomorrow.  #3 she will be seen back in one week's time.  All questions were answered. The patient knows to call the clinic with any problems, questions or concerns. We can certainly see the patient much sooner if necessary.  I spent 25 minutes counseling the patient face to face. The total time spent in the appointment was 30 minutes.  Drue Second, MD Medical/Oncology [facility] [phone] (beeper) [phone] (Office)  12/22/2011, 9:58 AM

## 2011-12-23 ENCOUNTER — Ambulatory Visit (HOSPITAL_BASED_OUTPATIENT_CLINIC_OR_DEPARTMENT_OTHER): Payer: 59

## 2011-12-23 VITALS — BP 136/81 | HR 91 | Temp 98.3°F

## 2011-12-23 DIAGNOSIS — D0590 Unspecified type of carcinoma in situ of unspecified breast: Secondary | ICD-10-CM

## 2011-12-23 DIAGNOSIS — Z23 Encounter for immunization: Secondary | ICD-10-CM

## 2011-12-23 DIAGNOSIS — Z5189 Encounter for other specified aftercare: Secondary | ICD-10-CM

## 2011-12-23 DIAGNOSIS — C50419 Malignant neoplasm of upper-outer quadrant of unspecified female breast: Secondary | ICD-10-CM

## 2011-12-23 MED ORDER — PEGFILGRASTIM INJECTION 6 MG/0.6ML
6.0000 mg | Freq: Once | SUBCUTANEOUS | Status: AC
  Administered 2011-12-23: 6 mg via SUBCUTANEOUS
  Filled 2011-12-23: qty 0.6

## 2011-12-23 MED ORDER — INFLUENZA VIRUS VACC SPLIT PF IM SUSP
0.5000 mL | Freq: Once | INTRAMUSCULAR | Status: AC
  Administered 2011-12-23: 0.5 mL via INTRAMUSCULAR
  Filled 2011-12-23: qty 0.5

## 2011-12-29 ENCOUNTER — Other Ambulatory Visit (HOSPITAL_BASED_OUTPATIENT_CLINIC_OR_DEPARTMENT_OTHER): Payer: 59 | Admitting: Lab

## 2011-12-29 ENCOUNTER — Ambulatory Visit (HOSPITAL_BASED_OUTPATIENT_CLINIC_OR_DEPARTMENT_OTHER): Payer: 59 | Admitting: Adult Health

## 2011-12-29 ENCOUNTER — Encounter: Payer: Self-pay | Admitting: Adult Health

## 2011-12-29 VITALS — BP 148/82 | HR 105 | Temp 99.0°F | Resp 20 | Ht 64.0 in | Wt 159.9 lb

## 2011-12-29 DIAGNOSIS — C50419 Malignant neoplasm of upper-outer quadrant of unspecified female breast: Secondary | ICD-10-CM

## 2011-12-29 DIAGNOSIS — R21 Rash and other nonspecific skin eruption: Secondary | ICD-10-CM

## 2011-12-29 DIAGNOSIS — D709 Neutropenia, unspecified: Secondary | ICD-10-CM

## 2011-12-29 DIAGNOSIS — D0590 Unspecified type of carcinoma in situ of unspecified breast: Secondary | ICD-10-CM

## 2011-12-29 LAB — COMPREHENSIVE METABOLIC PANEL (CC13)
Albumin: 3.6 g/dL (ref 3.5–5.0)
Alkaline Phosphatase: 112 U/L (ref 40–150)
BUN: 16 mg/dL (ref 7.0–26.0)
Glucose: 120 mg/dl — ABNORMAL HIGH (ref 70–99)
Potassium: 3.7 mEq/L (ref 3.5–5.1)

## 2011-12-29 LAB — CBC WITH DIFFERENTIAL/PLATELET
Basophils Absolute: 0 10*3/uL (ref 0.0–0.1)
Eosinophils Absolute: 0 10*3/uL (ref 0.0–0.5)
HGB: 12.3 g/dL (ref 11.6–15.9)
LYMPH%: 56.3 % — ABNORMAL HIGH (ref 14.0–49.7)
MCV: 92.1 fL (ref 79.5–101.0)
MONO%: 6.7 % (ref 0.0–14.0)
NEUT#: 0.5 10*3/uL — CL (ref 1.5–6.5)
Platelets: 118 10*3/uL — ABNORMAL LOW (ref 145–400)
RDW: 12 % (ref 11.2–14.5)

## 2011-12-29 NOTE — Patient Instructions (Addendum)
   Patient Neutropenia Instruction Sheet  Diagnosis: Breast Cancer      Treating Physician: Drue Second, MD  Treatment: 1. Type of chemotherapy: Dose dense FEC 2. Date of last treatment:  12/22/11  Last Blood Counts: Lab Results  Component Value Date   WBC 1.4* 12/29/2011   HGB 12.3 12/29/2011   HCT 36.0 12/29/2011   MCV 92.1 12/29/2011   PLT 118* 12/29/2011   ANC 500      Prophylactic Antibiotics: Cipro 500 mg by mouth twice a day Instructions: 1. Monitor temperature and call if fever  greater than 100.5, chills, shaking chills (rigors) 2. Call Physician on-call at [phone] 3. Give him/her symptoms and list of medications that you are taking and your last blood count.   Doing well.  Take 2 Ativan at night and see if it helps you sleep better. Hydrocortisone cream can be used for the rash, and Benadryl if you need it.  See you next week.

## 2011-12-29 NOTE — Progress Notes (Signed)
OFFICE PROGRESS NOTE  CC  Mary [name],TERESA, MD [address] [place] [address] Dr. Chipper Herb Dr. 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 Essentially patient has bilateral breast cancers.  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 It was recommended that patient proceed with neoadjuvant chemotherapy.  #2 patient has begun FEC 100 starting on 12/08/2011. A total of 4 cycles of this chemotherapy is planned and then thereafter she will proceed with Taxol as a single agent for total of 12 weeks.  CURRENT THERAPY Cycle 2 day 8 of FEC  INTERVAL HISTORY: Mary [name] 51 y.o. female returns for follow-up visit today.  She has tolerated her chemotherapy very well.  She has had some difficulty sleeping at night, and has developed a rash on her upper chest wall.  This rash doesn't itch and has no other associated symptoms, it started a couple of days after chemo, and developed something similar with her previous cycle.  Otherwise she is feeling well and without concerns.   MEDICAL HISTORY: Past Medical History  Diagnosis Date  . Irritable bowel syndrome   . GERD (gastroesophageal reflux disease)     Nexium as needed  . Breast cancer August 2013    bilateral  . Dental crowns present     x 4  . Neutropenia, drug-induced 12/15/2011    ALLERGIES:   has no known allergies.  MEDICATIONS:  Current Outpatient Prescriptions  Medication Sig Dispense Refill  . ALPRAZolam (XANAX) 0.25 MG tablet Take 0.25 mg by mouth 3 (three) times daily as needed.       . Ascorbic Acid (VITAMIN C) 100 MG tablet Take 100 mg by mouth daily.        Marland Kitchen b complex vitamins tablet Take 1 tablet by mouth daily.       . calcium carbonate (OS-CAL)  600 MG TABS Take 600 mg by mouth 2 (two) times daily with a meal.        . cholecalciferol (VITAMIN D) 400 UNITS TABS Take by mouth.      . ciprofloxacin (CIPRO) 500 MG tablet Take 1 tablet (500 mg total) by mouth 2 (two) times daily.  14 tablet  6  . dexamethasone (DECADRON) 4 MG tablet Take 2 tablets by mouth once a day on the day after chemotherapy and then take 2 tablets two times a day for 2 days. Take with food.  30 tablet  1  . lidocaine-prilocaine (EMLA) cream Apply topically as needed.  30 g  6  . loperamide (IMODIUM A-D) 2 MG tablet Take 2 mg by mouth 4 (four) times daily as needed.      Marland Kitchen LORazepam (ATIVAN) 0.5 MG tablet Take 1 tablet (0.5 mg total) by mouth every 6 (six) hours as needed (Nausea or vomiting).  30 tablet  0  . Misc. Devices MISC 1 each by Does not apply route daily.  1 each  0  . Multiple Vitamin (MULTIVITAMIN) tablet Take 1 tablet by mouth daily.        Marland Kitchen NEXIUM 40 MG capsule TAKE 1 CAPSULE BY MOUTH DAILY.  30 capsule  6  . ondansetron (ZOFRAN) 8 MG tablet Take 1 tablet two times a day as needed for nausea or vomiting starting on the third  day after chemotherapy.  30 tablet  1  . prochlorperazine (COMPAZINE) 10 MG tablet Take 1 tablet (10 mg total) by mouth every 6 (six) hours as needed (Nausea or vomiting).  30 tablet  1  . prochlorperazine (COMPAZINE) 25 MG suppository Place 1 suppository (25 mg total) rectally every 12 (twelve) hours as needed for nausea.  12 suppository  3  . vitamin B-12 (CYANOCOBALAMIN) 100 MCG tablet Take 50 mcg by mouth daily.          SURGICAL HISTORY:  Past Surgical History  Procedure Date  . Breast lumpectomy 2008    left  . Portacath placement 12/02/2011    Procedure: INSERTION PORT-A-CATH;  Surgeon: Ernestene Mention, MD;  Location: [facility];  Service: General;  Laterality: N/A;  insertion port a cath with fluoroscopy    REVIEW OF SYSTEMS:   General: fatigue (+), night sweats (-), fever (-), pain (-) Lymph: palpable  nodes (-) HEENT: vision changes (-), mucositis (-), gum bleeding (-), epistaxis (-) Cardiovascular: chest pain (-), palpitations (-) Pulmonary: shortness of breath (-), dyspnea on exertion (-), cough (-), hemoptysis (-) GI:  Early satiety (-), melena (-), dysphagia (-), nausea/vomiting (-), diarrhea (-) GU: dysuria (-), hematuria (-), incontinence (-) Musculoskeletal: joint swelling (-), joint pain (-), back pain (-) Neuro: weakness (-), numbness (-), headache (-), confusion (-) Skin: Rash (+), lesions (-), dryness (-) Psych: depression (-), suicidal/homicidal ideation (-), feeling of hopelessness (-)   PHYSICAL EXAMINATION:  BP 148/82  Pulse 105  Temp 99 F (37.2 C) (Oral)  Resp 20  Ht 5\' 4"  (1.626 m)  Wt 159 lb 14.4 oz (72.53 kg)  BMI 27.45 kg/m2 General: Patient is a well appearing female in no acute distress HEENT: PERRLA, sclerae anicteric no conjunctival pallor, MMM Neck: supple, no palpable adenopathy Lungs: clear to auscultation bilaterally, no wheezes, rhonchi, or rales Cardiovascular: regular rate rhythm, S1, S2, no murmurs, rubs or gallops Abdomen: Soft, non-tender, non-distended, normoactive bowel sounds, no HSM Extremities: warm and well perfused, no clubbing, cyanosis, or edema Skin: No lesions.  Pt has rash maculopapular rash on upper chest/lower neck in the shape of a v.   Neuro: Non-focal Breast: right breast with retracted nipple, and palpable mass, left breast, no masses or abnormalities ECOG PERFORMANCE STATUS: 0 - Asymptomatic   LABORATORY DATA: Lab Results  Component Value Date   WBC 1.4* 12/29/2011   HGB 12.3 12/29/2011   HCT 36.0 12/29/2011   MCV 92.1 12/29/2011   PLT 118* 12/29/2011  ANC: 500    Chemistry      Component Value Date/Time   NA 139 12/22/2011 0924   NA 140 05/20/2011 1204   K 4.1 12/22/2011 0924   K 5.2 05/20/2011 1204   CL 107 12/22/2011 0924   CL 108 05/20/2011 1204   CO2 21* 12/22/2011 0924   CO2 22 05/20/2011 1204   BUN 13.0  12/22/2011 0924   BUN 10 05/20/2011 1204   CREATININE 0.8 12/22/2011 0924   CREATININE 0.82 05/20/2011 1204   CREATININE 0.9 01/18/2011 1202      Component Value Date/Time   CALCIUM 9.0 12/22/2011 0924   CALCIUM 8.6 05/20/2011 1204   ALKPHOS 119 12/22/2011 0924   ALKPHOS 80 05/20/2011 1204   AST 16 12/22/2011 0924   AST 21 05/20/2011 1204   ALT 20 12/22/2011 0924   ALT 29 05/20/2011 1204   BILITOT <0.20 Repeated and Verified 12/22/2011 0924   BILITOT 0.3 05/20/2011 1204  RADIOGRAPHIC STUDIES:  ASSESSMENT: 51 year old female with  #1 T3, N1, 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 Patient is now going to receive neoadjuvant chemotherapy consisting of FEC 100. She will receive dose dense FEC with day 2 Neulasta. She is overall doing well she understands the rationale for neoadjuvant treatment she understands the side effects of her chemotherapy. She also has her antiemetics already at her disposal to take as directed.  #2 Cycle 2 day 8 of FEC with Neulasta support.  Pt is now neutropenic.    #3 Rash  #4 Insomnia  PLAN:   #1 Ms Delahoz is now neutropenic.  She will get a refill of her Cipro and start to take it.  She was counseled on neutropenic precautions and was given a handout on her AVS.    #2 I have recommended that she take benadryl if needed for the rash.  She will also apply Hydrocortisone cream to see if it helps.    #3 Pt counseled about healthy sleep practices.  She will take two ativan at night to see if it will help.  #4 Pt will return in one week for cycle 3.   All questions were answered. The patient knows to call the clinic with any problems, questions or concerns. We can certainly see the patient much sooner if necessary.  I spent 25 minutes counseling the patient face to face. The total time spent in the appointment was 30 minutes.  Cherie Ouch Lyn Hollingshead, NP Medical Oncology [facility] Phone: [phone]  12/29/2011, 12:39 PM

## 2012-01-02 ENCOUNTER — Telehealth: Payer: Self-pay | Admitting: *Deleted

## 2012-01-02 NOTE — Telephone Encounter (Signed)
Patient calling in to ask if we feel an oatmeal bath would help with the rash on her chest she discussed with Augustin Schooling, NP last week. States she has been using Hydrocortisone cream twice daily and taking Benadryl orally at night because it makes her sleepy. Patient denies any "real pain" or "terrible itching", states it is just bothersome and irritating. Instructed patient that an oatmeal bath should not hinder anything and would be soothing overall, however, she should be diligent with Cortisone cream BID and change oral Benadryl to topical cream BID.  Use of Claritan orally may also help calm the rash. Instructed patient however to make sure that she does not use any creams/lotions/sprays with fragrance in the areas. If rash does not appear to better in next 24-48hours, or if fever develops, she is to call us immediately for [place] to further evaluate.  Patient verbalized understanding.

## 2012-01-05 ENCOUNTER — Other Ambulatory Visit: Payer: Self-pay | Admitting: Oncology

## 2012-01-05 ENCOUNTER — Other Ambulatory Visit (HOSPITAL_BASED_OUTPATIENT_CLINIC_OR_DEPARTMENT_OTHER): Payer: 59 | Admitting: Lab

## 2012-01-05 ENCOUNTER — Ambulatory Visit (HOSPITAL_BASED_OUTPATIENT_CLINIC_OR_DEPARTMENT_OTHER): Payer: 59 | Admitting: Oncology

## 2012-01-05 ENCOUNTER — Encounter: Payer: Self-pay | Admitting: Oncology

## 2012-01-05 ENCOUNTER — Ambulatory Visit (HOSPITAL_BASED_OUTPATIENT_CLINIC_OR_DEPARTMENT_OTHER): Payer: 59

## 2012-01-05 VITALS — BP 150/90 | HR 85 | Temp 98.4°F | Resp 20 | Ht 64.0 in | Wt 159.1 lb

## 2012-01-05 DIAGNOSIS — C50419 Malignant neoplasm of upper-outer quadrant of unspecified female breast: Secondary | ICD-10-CM

## 2012-01-05 DIAGNOSIS — G4700 Insomnia, unspecified: Secondary | ICD-10-CM

## 2012-01-05 DIAGNOSIS — R21 Rash and other nonspecific skin eruption: Secondary | ICD-10-CM

## 2012-01-05 DIAGNOSIS — Z5111 Encounter for antineoplastic chemotherapy: Secondary | ICD-10-CM

## 2012-01-05 DIAGNOSIS — D0590 Unspecified type of carcinoma in situ of unspecified breast: Secondary | ICD-10-CM

## 2012-01-05 LAB — CBC WITH DIFFERENTIAL/PLATELET
Basophils Absolute: 0.1 10*3/uL (ref 0.0–0.1)
Eosinophils Absolute: 0 10*3/uL (ref 0.0–0.5)
HGB: 11.8 g/dL (ref 11.6–15.9)
MCV: 93.5 fL (ref 79.5–101.0)
MONO#: 0.6 10*3/uL (ref 0.1–0.9)
NEUT#: 7 10*3/uL — ABNORMAL HIGH (ref 1.5–6.5)
RDW: 12.7 % (ref 11.2–14.5)
WBC: 9 10*3/uL (ref 3.9–10.3)
lymph#: 1.2 10*3/uL (ref 0.9–3.3)

## 2012-01-05 LAB — COMPREHENSIVE METABOLIC PANEL (CC13)
Albumin: 3.6 g/dL (ref 3.5–5.0)
BUN: 15 mg/dL (ref 7.0–26.0)
CO2: 21 mEq/L — ABNORMAL LOW (ref 22–29)
Calcium: 8.9 mg/dL (ref 8.4–10.4)
Chloride: 107 mEq/L (ref 98–107)
Glucose: 101 mg/dl — ABNORMAL HIGH (ref 70–99)
Potassium: 3.9 mEq/L (ref 3.5–5.1)
Total Protein: 6 g/dL — ABNORMAL LOW (ref 6.4–8.3)

## 2012-01-05 MED ORDER — DEXAMETHASONE SODIUM PHOSPHATE 4 MG/ML IJ SOLN
12.0000 mg | Freq: Once | INTRAMUSCULAR | Status: AC
  Administered 2012-01-05: 12 mg via INTRAVENOUS

## 2012-01-05 MED ORDER — SODIUM CHLORIDE 0.9 % IV SOLN
150.0000 mg | Freq: Once | INTRAVENOUS | Status: AC
  Administered 2012-01-05: 150 mg via INTRAVENOUS
  Filled 2012-01-05: qty 5

## 2012-01-05 MED ORDER — FLUOROURACIL CHEMO INJECTION 2.5 GM/50ML
500.0000 mg/m2 | Freq: Once | INTRAVENOUS | Status: AC
  Administered 2012-01-05: 900 mg via INTRAVENOUS
  Filled 2012-01-05: qty 18

## 2012-01-05 MED ORDER — SODIUM CHLORIDE 0.9 % IJ SOLN
10.0000 mL | INTRAMUSCULAR | Status: DC | PRN
  Administered 2012-01-05: 10 mL
  Filled 2012-01-05: qty 10

## 2012-01-05 MED ORDER — SODIUM CHLORIDE 0.9 % IV SOLN
Freq: Once | INTRAVENOUS | Status: AC
  Administered 2012-01-05: 11:00:00 via INTRAVENOUS

## 2012-01-05 MED ORDER — SODIUM CHLORIDE 0.9 % IV SOLN
500.0000 mg/m2 | Freq: Once | INTRAVENOUS | Status: AC
  Administered 2012-01-05: 900 mg via INTRAVENOUS
  Filled 2012-01-05: qty 45

## 2012-01-05 MED ORDER — HEPARIN SOD (PORK) LOCK FLUSH 100 UNIT/ML IV SOLN
500.0000 [IU] | Freq: Once | INTRAVENOUS | Status: AC | PRN
  Administered 2012-01-05: 500 [IU]
  Filled 2012-01-05: qty 5

## 2012-01-05 MED ORDER — LORAZEPAM 0.5 MG PO TABS: 0.5000 mg | ORAL_TABLET | Freq: Three times a day (TID) | ORAL | Status: DC

## 2012-01-05 MED ORDER — EPIRUBICIN HCL CHEMO IV INJECTION 200 MG/100ML
180.0000 mg | Freq: Once | INTRAVENOUS | Status: AC
  Administered 2012-01-05: 180 mg via INTRAVENOUS
  Filled 2012-01-05: qty 90

## 2012-01-05 MED ORDER — PALONOSETRON HCL INJECTION 0.25 MG/5ML
0.2500 mg | Freq: Once | INTRAVENOUS | Status: AC
  Administered 2012-01-05: 0.25 mg via INTRAVENOUS

## 2012-01-05 NOTE — Patient Instructions (Addendum)
Proceed with chemotherapy today. Your blood counts look good.  I will see you back in 1 week  Continue the benadryl and cortisone for the rash

## 2012-01-05 NOTE — Progress Notes (Signed)
OFFICE PROGRESS NOTE  CC  TULLO,TERESA, MD [address] [place] [address] Dr. Chipper Herb Dr. 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 Essentially patient has bilateral breast cancers.  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   #2 patient has begun FEC 100 starting on 12/08/2011. A total of 4 cycles of this chemotherapy is planned and then thereafter she will proceed with Taxol as a single agent for total of 12 weeks.  CURRENT THERAPY patient is here for cycle #3 day 1 of FEC being given neoadjuvantly  INTERVAL HISTORY: Mary [name] 51 y.o. female returns for follow-up visit today. Overall patient is doing well her counts are recovered very nicely. She was neutropenic last week she was started on prophylactic antibiotics. She did not spike any temperatures. She is still noted to have a rash. The rash is of unclear etiology but most likely is due to sun sensitivity versus a drug rash we'll continue to monitor this in the meantime she is taking Benadryl and cortisone cream. She today denies any nausea vomiting fevers chills. Her major of the review of systems is as below.   MEDICAL HISTORY: Past Medical History  Diagnosis Date  . Irritable bowel syndrome   . GERD (gastroesophageal reflux disease)     Nexium as needed  . Breast cancer August 2013    bilateral  . Dental crowns present     x 4  . Neutropenia, drug-induced 12/15/2011    ALLERGIES:   has no known allergies.  MEDICATIONS:  Current Outpatient Prescriptions  Medication Sig Dispense Refill  . ALPRAZolam (XANAX) 0.25 MG tablet Take 0.25 mg by mouth 3 (three) times daily as needed.       . Ascorbic Acid (VITAMIN C) 100 MG tablet Take 100 mg  by mouth daily.        Marland Kitchen b complex vitamins tablet Take 1 tablet by mouth daily.       . calcium carbonate (OS-CAL) 600 MG TABS Take 600 mg by mouth 2 (two) times daily with a meal.        . cholecalciferol (VITAMIN D) 400 UNITS TABS Take by mouth.      . ciprofloxacin (CIPRO) 500 MG tablet Take 1 tablet (500 mg total) by mouth 2 (two) times daily.  14 tablet  6  . dexamethasone (DECADRON) 4 MG tablet Take 2 tablets by mouth once a day on the day after chemotherapy and then take 2 tablets two times a day for 2 days. Take with food.  30 tablet  1  . lidocaine-prilocaine (EMLA) cream Apply topically as needed.  30 g  6  . loperamide (IMODIUM A-D) 2 MG tablet Take 2 mg by mouth 4 (four) times daily as needed.      Marland Kitchen LORazepam (ATIVAN) 0.5 MG tablet Take 1 tablet (0.5 mg total) by mouth every 6 (six) hours as needed (Nausea or vomiting).  30 tablet  0  . Misc. Devices MISC 1 each by Does not apply route daily.  1 each  0  . Multiple Vitamin (MULTIVITAMIN) tablet Take 1 tablet by mouth daily.        Marland Kitchen NEXIUM 40 MG capsule TAKE 1 CAPSULE BY MOUTH DAILY.  30 capsule  6  .  ondansetron (ZOFRAN) 8 MG tablet Take 1 tablet two times a day as needed for nausea or vomiting starting on the third day after chemotherapy.  30 tablet  1  . prochlorperazine (COMPAZINE) 10 MG tablet Take 1 tablet (10 mg total) by mouth every 6 (six) hours as needed (Nausea or vomiting).  30 tablet  1  . prochlorperazine (COMPAZINE) 25 MG suppository Place 1 suppository (25 mg total) rectally every 12 (twelve) hours as needed for nausea.  12 suppository  3  . vitamin B-12 (CYANOCOBALAMIN) 100 MCG tablet Take 50 mcg by mouth daily.          SURGICAL HISTORY:  Past Surgical History  Procedure Date  . Breast lumpectomy 2008    left  . Portacath placement 12/02/2011    Procedure: INSERTION PORT-A-CATH;  Surgeon: Ernestene Mention, MD;  Location: [facility];  Service: General;  Laterality: N/A;  insertion port a cath with  fluoroscopy    REVIEW OF SYSTEMS:   General: fatigue (+), night sweats (-), fever (-), pain (-) Lymph: palpable nodes (-) HEENT: vision changes (-), mucositis (-), gum bleeding (-), epistaxis (-) Cardiovascular: chest pain (-), palpitations (-) Pulmonary: shortness of breath (-), dyspnea on exertion (-), cough (-), hemoptysis (-) GI:  Early satiety (-), melena (-), dysphagia (-), nausea/vomiting (-), diarrhea (-) GU: dysuria (-), hematuria (-), incontinence (-) Musculoskeletal: joint swelling (-), joint pain (-), back pain (-) Neuro: weakness (-), numbness (-), headache (-), confusion (-) Skin: Rash (+), lesions (-), dryness (-) Psych: depression (-), suicidal/homicidal ideation (-), feeling of hopelessness (-)   PHYSICAL EXAMINATION:  BP 150/90  Pulse 85  Temp 98.4 F (36.9 C) (Oral)  Resp 20  Ht 5\' 4"  (1.626 m)  Wt 159 lb 1.6 oz (72.167 kg)  BMI 27.31 kg/m2 General: Patient is a well appearing female in no acute distress HEENT: PERRLA, sclerae anicteric no conjunctival pallor, MMM Neck: supple, no palpable adenopathy Lungs: clear to auscultation bilaterally, no wheezes, rhonchi, or rales Cardiovascular: regular rate rhythm, S1, S2, no murmurs, rubs or gallops Abdomen: Soft, non-tender, non-distended, normoactive bowel sounds, no HSM Extremities: warm and well perfused, no clubbing, cyanosis, or edema Skin: No lesions.  Pt has a maculopapular rash on upper chest/lower neck in the shape of a v.   Neuro: Non-focal Breast: right breast with retracted nipple, and palpable mass, left breast, no masses or abnormalities ECOG PERFORMANCE STATUS: 0 - Asymptomatic   LABORATORY DATA: Lab Results  Component Value Date   WBC 9.0 01/05/2012   HGB 11.8 01/05/2012   HCT 34.8 01/05/2012   MCV 93.5 01/05/2012   PLT 194 01/05/2012  ANC: 500    Chemistry      Component Value Date/Time   NA 137 12/29/2011 1219   NA 140 05/20/2011 1204   K 3.7 12/29/2011 1219   K 5.2 05/20/2011 1204    CL 102 12/29/2011 1219   CL 108 05/20/2011 1204   CO2 23 12/29/2011 1219   CO2 22 05/20/2011 1204   BUN 16.0 12/29/2011 1219   BUN 10 05/20/2011 1204   CREATININE 0.8 12/29/2011 1219   CREATININE 0.82 05/20/2011 1204   CREATININE 0.9 01/18/2011 1202      Component Value Date/Time   CALCIUM 9.7 12/29/2011 1219   CALCIUM 8.6 05/20/2011 1204   ALKPHOS 112 12/29/2011 1219   ALKPHOS 80 05/20/2011 1204   AST 8 12/29/2011 1219   AST 21 05/20/2011 1204   ALT 13 12/29/2011 1219   ALT  29 05/20/2011 1204   BILITOT 0.50 12/29/2011 1219   BILITOT 0.3 05/20/2011 1204       RADIOGRAPHIC STUDIES:  ASSESSMENT: 51 year old female with  #1 T3, N1, 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 Patient is now going to receive neoadjuvant chemotherapy consisting of FEC 100. She will receive dose dense FEC with day 2 Neulasta. She is overall doing well she understands the rationale for neoadjuvant treatment she understands the side effects of her chemotherapy. She also has her antiemetics already at her disposal to take as directed.  #2 Cycle 3 day 1 of FEC with Neulasta support.    #3 Rash  #4 Insomnia  PLAN:  #1 patient will proceed with cycle #3 of FEC today with day 2 Neulasta tomorrow.  #2 rash most likely due to some sensitivity she will continue taking the cortisone and Benadryl we will continue to monitor this.  #3 she will return in one week's time for interim labs and office visit.  All questions were answered. The patient knows to call the clinic with any problems, questions or concerns. We can certainly see the patient much sooner if necessary.  I spent 25 minutes counseling the patient face to face. The total time spent in the appointment was 30 minutes.  01/05/2012, 10:00 AM

## 2012-01-06 ENCOUNTER — Ambulatory Visit (HOSPITAL_BASED_OUTPATIENT_CLINIC_OR_DEPARTMENT_OTHER): Payer: 59

## 2012-01-06 VITALS — BP 133/68 | HR 95 | Temp 97.6°F

## 2012-01-06 DIAGNOSIS — Z5189 Encounter for other specified aftercare: Secondary | ICD-10-CM

## 2012-01-06 DIAGNOSIS — C50419 Malignant neoplasm of upper-outer quadrant of unspecified female breast: Secondary | ICD-10-CM

## 2012-01-06 MED ORDER — PEGFILGRASTIM INJECTION 6 MG/0.6ML
6.0000 mg | Freq: Once | SUBCUTANEOUS | Status: AC
  Administered 2012-01-06: 6 mg via SUBCUTANEOUS
  Filled 2012-01-06: qty 0.6

## 2012-01-09 ENCOUNTER — Telehealth: Payer: Self-pay | Admitting: *Deleted

## 2012-01-09 NOTE — Telephone Encounter (Signed)
Per MD,pt to be seen 01/12/12 0845 lab, 0900 midlevel Pt notified aware of appt date/time

## 2012-01-09 NOTE — Telephone Encounter (Signed)
Pt called [facility] with concerns " am I suppose to come in next week for interim labs and MD visit?" Schedule showing appt 10/24 0900 lab only  No md/midlevel appt at this time.   Appts available 10/24  0900 with Mid-level (lab to be earlier) Please advise

## 2012-01-09 NOTE — Telephone Encounter (Signed)
Patient needs a NP appointment as well

## 2012-01-12 ENCOUNTER — Telehealth: Payer: Self-pay | Admitting: *Deleted

## 2012-01-12 ENCOUNTER — Encounter: Payer: Self-pay | Admitting: Adult Health

## 2012-01-12 ENCOUNTER — Ambulatory Visit (HOSPITAL_BASED_OUTPATIENT_CLINIC_OR_DEPARTMENT_OTHER): Payer: 59 | Admitting: Adult Health

## 2012-01-12 ENCOUNTER — Other Ambulatory Visit: Payer: 59

## 2012-01-12 ENCOUNTER — Other Ambulatory Visit (HOSPITAL_BASED_OUTPATIENT_CLINIC_OR_DEPARTMENT_OTHER): Payer: 59 | Admitting: Lab

## 2012-01-12 VITALS — BP 119/81 | HR 89 | Temp 98.7°F | Resp 20 | Ht 64.0 in | Wt 159.9 lb

## 2012-01-12 DIAGNOSIS — D0590 Unspecified type of carcinoma in situ of unspecified breast: Secondary | ICD-10-CM

## 2012-01-12 DIAGNOSIS — C50419 Malignant neoplasm of upper-outer quadrant of unspecified female breast: Secondary | ICD-10-CM

## 2012-01-12 DIAGNOSIS — R21 Rash and other nonspecific skin eruption: Secondary | ICD-10-CM

## 2012-01-12 LAB — COMPREHENSIVE METABOLIC PANEL (CC13)
ALT: 10 U/L (ref 0–55)
AST: 6 U/L (ref 5–34)
Alkaline Phosphatase: 118 U/L (ref 40–150)
BUN: 16 mg/dL (ref 7.0–26.0)
Calcium: 9.3 mg/dL (ref 8.4–10.4)
Chloride: 103 mEq/L (ref 98–107)
Creatinine: 0.7 mg/dL (ref 0.6–1.1)
Total Bilirubin: 0.4 mg/dL (ref 0.20–1.20)

## 2012-01-12 LAB — CBC WITH DIFFERENTIAL/PLATELET
Basophils Absolute: 0 10*3/uL (ref 0.0–0.1)
Eosinophils Absolute: 0 10*3/uL (ref 0.0–0.5)
HGB: 11.4 g/dL — ABNORMAL LOW (ref 11.6–15.9)
MONO#: 0.1 10*3/uL (ref 0.1–0.9)
NEUT#: 1 10*3/uL — ABNORMAL LOW (ref 1.5–6.5)
RDW: 12.5 % (ref 11.2–14.5)
WBC: 1.7 10*3/uL — ABNORMAL LOW (ref 3.9–10.3)
lymph#: 0.5 10*3/uL — ABNORMAL LOW (ref 0.9–3.3)

## 2012-01-12 NOTE — Telephone Encounter (Signed)
weekly taxol starting 11/14  Sent email to [facility] to set up treatment

## 2012-01-12 NOTE — Telephone Encounter (Signed)
Per staff message and POF I have tried to schedule appts. The first time treatment is to start on 11/14 after MD appt. MD appt is to late in afternoon to start, sent scheduler message back to fix appt.  JMW

## 2012-01-12 NOTE — Patient Instructions (Addendum)
Doing well.  We will see you back on 10/31 for your last cycle.  Please call us if you have any questions or concerns.

## 2012-01-12 NOTE — Progress Notes (Signed)
OFFICE PROGRESS NOTE  CC  Mary [name],TERESA, MD [address] [place] [address] Dr. Chipper Herb Dr. 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 Essentially patient has bilateral breast cancers.  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   #2 patient has begun FEC 100 starting on 12/08/2011. A total of 4 cycles of this chemotherapy is planned and then thereafter she will proceed with Taxol as a single agent for total of 12 weeks.  CURRENT THERAPY patient is here for cycle #3 day 8 of FEC being given neoadjuvantly  INTERVAL HISTORY: Mary [name] 51 y.o. female returns for follow-up visit today. She is feeling well.  Her rash is much improved with the use of benadryl and cortisone cream.  She is having to get up in the middle of the night to urinate every couple of hours, however, she does take in an increased amount of fluids until late into the evening.  Otherwise, she is feeling well and w/o any questions or concerns.    MEDICAL HISTORY: Past Medical History  Diagnosis Date  . Irritable bowel syndrome   . GERD (gastroesophageal reflux disease)     Nexium as needed  . Breast cancer August 2013    bilateral  . Dental crowns present     x 4  . Neutropenia, drug-induced 12/15/2011    ALLERGIES:   has no known allergies.  MEDICATIONS:  Current Outpatient Prescriptions  Medication Sig Dispense Refill  . ALPRAZolam (XANAX) 0.25 MG tablet Take 0.25 mg by mouth 3 (three) times daily as needed.       . Ascorbic Acid (VITAMIN C) 100 MG tablet Take 100 mg by mouth daily.        Marland Kitchen b complex vitamins tablet Take 1 tablet by mouth daily.       . calcium carbonate (OS-CAL) 600 MG TABS Take 600 mg by mouth 2 (two) times  daily with a meal.        . cholecalciferol (VITAMIN D) 400 UNITS TABS Take by mouth.      . ciprofloxacin (CIPRO) 500 MG tablet Take 1 tablet (500 mg total) by mouth 2 (two) times daily.  14 tablet  6  . dexamethasone (DECADRON) 4 MG tablet Take 2 tablets by mouth once a day on the day after chemotherapy and then take 2 tablets two times a day for 2 days. Take with food.  30 tablet  1  . lidocaine-prilocaine (EMLA) cream Apply topically as needed.  30 g  6  . loperamide (IMODIUM A-D) 2 MG tablet Take 2 mg by mouth 4 (four) times daily as needed.      Marland Kitchen LORazepam (ATIVAN) 0.5 MG tablet Take 1 tablet (0.5 mg total) by mouth every 6 (six) hours as needed (Nausea or vomiting).  30 tablet  0  . LORazepam (ATIVAN) 0.5 MG tablet Take 1 tablet (0.5 mg total) by mouth every 8 (eight) hours.  60 tablet  3  . Misc. Devices MISC 1 each by Does not apply route daily.  1 each  0  . Multiple Vitamin (MULTIVITAMIN) tablet Take 1 tablet by mouth daily.        Marland Kitchen NEXIUM 40 MG capsule TAKE 1 CAPSULE BY MOUTH DAILY.  30 capsule  6  . ondansetron (ZOFRAN) 8 MG tablet Take 1 tablet two times a day as needed for nausea or vomiting starting on the third day after chemotherapy.  30 tablet  1  . prochlorperazine (COMPAZINE) 10 MG tablet Take 1 tablet (10 mg total) by mouth every 6 (six) hours as needed (Nausea or vomiting).  30 tablet  1  . prochlorperazine (COMPAZINE) 25 MG suppository Place 1 suppository (25 mg total) rectally every 12 (twelve) hours as needed for nausea.  12 suppository  3  . vitamin B-12 (CYANOCOBALAMIN) 100 MCG tablet Take 50 mcg by mouth daily.          SURGICAL HISTORY:  Past Surgical History  Procedure Date  . Breast lumpectomy 2008    left  . Portacath placement 12/02/2011    Procedure: INSERTION PORT-A-CATH;  Surgeon: Ernestene Mention, MD;  Location: [facility];  Service: General;  Laterality: N/A;  insertion port a cath with fluoroscopy    REVIEW OF SYSTEMS:   General:  fatigue (+), night sweats (-), fever (-), pain (-) Lymph: palpable nodes (-) HEENT: vision changes (-), mucositis (-), gum bleeding (-), epistaxis (-) Cardiovascular: chest pain (-), palpitations (-) Pulmonary: shortness of breath (-), dyspnea on exertion (-), cough (-), hemoptysis (-) GI:  Early satiety (-), melena (-), dysphagia (-), nausea/vomiting (-), diarrhea (-) GU: dysuria (-), hematuria (-), incontinence (-) Musculoskeletal: joint swelling (-), joint pain (-), back pain (-) Neuro: weakness (-), numbness (-), headache (-), confusion (-) Skin: Rash (+), lesions (-), dryness (-) Psych: depression (-), suicidal/homicidal ideation (-), feeling of hopelessness (-)   PHYSICAL EXAMINATION:  BP 119/81  Pulse 89  Temp 98.7 F (37.1 C) (Oral)  Resp 20  Ht 5\' 4"  (1.626 m)  Wt 159 lb 14.4 oz (72.53 kg)  BMI 27.45 kg/m2 General: Patient is a well appearing female in no acute distress HEENT: PERRLA, sclerae anicteric no conjunctival pallor, MMM Neck: supple, no palpable adenopathy Lungs: clear to auscultation bilaterally, no wheezes, rhonchi, or rales Cardiovascular: regular rate rhythm, S1, S2, no murmurs, rubs or gallops Abdomen: Soft, non-tender, non-distended, normoactive bowel sounds, no HSM Extremities: warm and well perfused, no clubbing, cyanosis, or edema Skin: No lesions.  Pt has a maculopapular rash on upper chest/lower neck in the shape of a v.   Neuro: Non-focal Breast: right breast with retracted nipple, and palpable mass, left breast, no masses or abnormalities ECOG PERFORMANCE STATUS: 0 - Asymptomatic   LABORATORY DATA: Lab Results  Component Value Date   WBC 1.7* 01/12/2012   HGB 11.4* 01/12/2012   HCT 33.0* 01/12/2012   MCV 94.6 01/12/2012   PLT 103* 01/12/2012  ANC: 1000    Chemistry      Component Value Date/Time   NA 138 01/05/2012 0907   NA 140 05/20/2011 1204   K 3.9 01/05/2012 0907   K 5.2 05/20/2011 1204   CL 107 01/05/2012 0907   CL 108 05/20/2011  1204   CO2 21* 01/05/2012 0907   CO2 22 05/20/2011 1204   BUN 15.0 01/05/2012 0907   BUN 10 05/20/2011 1204   CREATININE 0.8 01/05/2012 0907   CREATININE 0.82 05/20/2011 1204   CREATININE 0.9 01/18/2011 1202      Component Value Date/Time   CALCIUM 8.9 01/05/2012 0907   CALCIUM 8.6 05/20/2011 1204   ALKPHOS 114 01/05/2012 0907   ALKPHOS 80 05/20/2011 1204   AST 12 01/05/2012 0907   AST 21 05/20/2011 1204   ALT 16 01/05/2012 0907  ALT 29 05/20/2011 1204   BILITOT 0.20 01/05/2012 0907   BILITOT 0.3 05/20/2011 1204       RADIOGRAPHIC STUDIES:  ASSESSMENT: 51 year old female with  #1 T3, N1, 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 Patient is now going to receive neoadjuvant chemotherapy consisting of FEC 100. She will receive dose dense FEC with day 2 Neulasta. She is overall doing well she understands the rationale for neoadjuvant treatment she understands the side effects of her chemotherapy. She also has her antiemetics already at her disposal to take as directed.  #2 Cycle 3 day 8 of FEC with Neulasta support.    #3 Rash  PLAN:  #1 Ms. Winders is feeling well.  Her interim lab work is good, she does not need prophylactic antibiotics.  She knows to call us should she have any fevers, chills, or any other concerns.    #2 rash most likely due to some sensitivity she will continue taking the cortisone and Benadryl we will continue to monitor this.  #3 she will return in one week's time for cycle 4 of FEC.  All questions were answered. The patient knows to call the clinic with any problems, questions or concerns. We can certainly see the patient much sooner if necessary.  I spent 15 minutes counseling the patient face to face. The total time spent in the appointment was 30 minutes.  This case was reviewed with Dr. Welton Flakes.  Cherie Ouch Lyn Hollingshead, NP Medical Oncology [facility] Phone: [phone]  01/12/2012, 9:06 AM

## 2012-01-13 ENCOUNTER — Telehealth: Payer: Self-pay | Admitting: *Deleted

## 2012-01-13 NOTE — Telephone Encounter (Signed)
Received new MD time for 11/14, chemo appt made. Other weekly MD appts all on different days, checking with desk RN to see about chemo orders. Waiting to hear from desk RN. JMW

## 2012-01-16 ENCOUNTER — Telehealth: Payer: Self-pay | Admitting: *Deleted

## 2012-01-16 NOTE — Telephone Encounter (Signed)
Per NP, pt to continue benadryl and cortisone. Monitor for changes. Pt advised "it's not itchy, but just raised, red and spotty" Pt verbalized understanding

## 2012-01-16 NOTE — Telephone Encounter (Signed)
Pt called states " I have a rash that started on Thursday to my arms/upper body and I've been taking benadryl, cortisone and it's not gone away. Just wanted to let MD/NP know. Didn't know what else to do for it." Pt last seen 10/24 f/u and chemo on 10/3. Will review with Provider

## 2012-01-19 ENCOUNTER — Ambulatory Visit (HOSPITAL_BASED_OUTPATIENT_CLINIC_OR_DEPARTMENT_OTHER): Payer: 59 | Admitting: Adult Health

## 2012-01-19 ENCOUNTER — Ambulatory Visit (HOSPITAL_BASED_OUTPATIENT_CLINIC_OR_DEPARTMENT_OTHER): Payer: 59

## 2012-01-19 ENCOUNTER — Encounter: Payer: Self-pay | Admitting: Adult Health

## 2012-01-19 ENCOUNTER — Other Ambulatory Visit (HOSPITAL_BASED_OUTPATIENT_CLINIC_OR_DEPARTMENT_OTHER): Payer: 59

## 2012-01-19 VITALS — BP 125/83 | HR 83 | Temp 98.3°F | Resp 20 | Ht 64.0 in | Wt 159.7 lb

## 2012-01-19 DIAGNOSIS — Z5111 Encounter for antineoplastic chemotherapy: Secondary | ICD-10-CM

## 2012-01-19 DIAGNOSIS — D0590 Unspecified type of carcinoma in situ of unspecified breast: Secondary | ICD-10-CM

## 2012-01-19 DIAGNOSIS — C50419 Malignant neoplasm of upper-outer quadrant of unspecified female breast: Secondary | ICD-10-CM

## 2012-01-19 DIAGNOSIS — R21 Rash and other nonspecific skin eruption: Secondary | ICD-10-CM

## 2012-01-19 LAB — COMPREHENSIVE METABOLIC PANEL (CC13)
Albumin: 3.8 g/dL (ref 3.5–5.0)
Alkaline Phosphatase: 137 U/L (ref 40–150)
BUN: 14 mg/dL (ref 7.0–26.0)
Calcium: 9.6 mg/dL (ref 8.4–10.4)
Chloride: 106 mEq/L (ref 98–107)
Creatinine: 0.8 mg/dL (ref 0.6–1.1)
Glucose: 103 mg/dl — ABNORMAL HIGH (ref 70–99)
Potassium: 4.2 mEq/L (ref 3.5–5.1)

## 2012-01-19 LAB — CBC WITH DIFFERENTIAL/PLATELET
Basophils Absolute: 0.1 10*3/uL (ref 0.0–0.1)
EOS%: 0.1 % (ref 0.0–7.0)
HCT: 32.8 % — ABNORMAL LOW (ref 34.8–46.6)
HGB: 11 g/dL — ABNORMAL LOW (ref 11.6–15.9)
LYMPH%: 9.3 % — ABNORMAL LOW (ref 14.0–49.7)
MCH: 31.8 pg (ref 25.1–34.0)
NEUT%: 82.7 % — ABNORMAL HIGH (ref 38.4–76.8)
Platelets: 183 10*3/uL (ref 145–400)
lymph#: 1 10*3/uL (ref 0.9–3.3)

## 2012-01-19 MED ORDER — PALONOSETRON HCL INJECTION 0.25 MG/5ML
0.2500 mg | Freq: Once | INTRAVENOUS | Status: AC
  Administered 2012-01-19: 0.25 mg via INTRAVENOUS

## 2012-01-19 MED ORDER — EPIRUBICIN HCL CHEMO IV INJECTION 200 MG/100ML
99.0000 mg/m2 | Freq: Once | INTRAVENOUS | Status: AC
  Administered 2012-01-19: 180 mg via INTRAVENOUS
  Filled 2012-01-19: qty 90

## 2012-01-19 MED ORDER — HEPARIN SOD (PORK) LOCK FLUSH 100 UNIT/ML IV SOLN
500.0000 [IU] | Freq: Once | INTRAVENOUS | Status: AC | PRN
  Administered 2012-01-19: 500 [IU]
  Filled 2012-01-19: qty 5

## 2012-01-19 MED ORDER — DEXAMETHASONE SODIUM PHOSPHATE 4 MG/ML IJ SOLN
12.0000 mg | Freq: Once | INTRAMUSCULAR | Status: AC
  Administered 2012-01-19: 12 mg via INTRAVENOUS

## 2012-01-19 MED ORDER — SODIUM CHLORIDE 0.9 % IV SOLN
500.0000 mg/m2 | Freq: Once | INTRAVENOUS | Status: AC
  Administered 2012-01-19: 900 mg via INTRAVENOUS
  Filled 2012-01-19: qty 45

## 2012-01-19 MED ORDER — FLUOROURACIL CHEMO INJECTION 2.5 GM/50ML
500.0000 mg/m2 | Freq: Once | INTRAVENOUS | Status: AC
  Administered 2012-01-19: 900 mg via INTRAVENOUS
  Filled 2012-01-19: qty 18

## 2012-01-19 MED ORDER — FOSAPREPITANT DIMEGLUMINE INJECTION 150 MG
150.0000 mg | Freq: Once | INTRAVENOUS | Status: AC
  Administered 2012-01-19: 150 mg via INTRAVENOUS
  Filled 2012-01-19: qty 5

## 2012-01-19 MED ORDER — SODIUM CHLORIDE 0.9 % IJ SOLN
10.0000 mL | INTRAMUSCULAR | Status: DC | PRN
  Administered 2012-01-19: 10 mL
  Filled 2012-01-19: qty 10

## 2012-01-19 MED ORDER — SODIUM CHLORIDE 0.9 % IV SOLN
Freq: Once | INTRAVENOUS | Status: AC
  Administered 2012-01-19: 10:00:00 via INTRAVENOUS

## 2012-01-19 NOTE — Patient Instructions (Addendum)
[facility] Discharge Instructions for Patients Receiving Chemotherapy  Today you received the following chemotherapy agents: 5FU, Ellence. Cytoxan.    To help prevent nausea and vomiting after your treatment, we encourage you to take your nausea medication.   Take it as often as prescribed.     If you develop nausea and vomiting that is not controlled by your nausea medication, call the clinic. If it is after clinic hours your family physician or the after hours number for the clinic or go to the Emergency Department.   BELOW ARE SYMPTOMS THAT SHOULD BE REPORTED IMMEDIATELY:  *FEVER GREATER THAN 100.5 F  *CHILLS WITH OR WITHOUT FEVER  NAUSEA AND VOMITING THAT IS NOT CONTROLLED WITH YOUR NAUSEA MEDICATION  *UNUSUAL SHORTNESS OF BREATH  *UNUSUAL BRUISING OR BLEEDING  TENDERNESS IN MOUTH AND THROAT WITH OR WITHOUT PRESENCE OF ULCERS  *URINARY PROBLEMS  *BOWEL PROBLEMS  UNUSUAL RASH Items with * indicate a potential emergency and should be followed up as soon as possible.  Feel free to call the clinic you have any questions or concerns. The clinic phone number is [phone].   I have been informed and understand all the instructions given to me. I know to contact the clinic, my physician, or go to the Emergency Department if any problems should occur. I do not have any questions at this time, but understand that I may call the clinic during office hours   should I have any questions or need assistance in obtaining follow up care.    __________________________________________  _____________  __________ Signature of Patient or Authorized Representative            Date                   Time    __________________________________________ Nurse's Signature

## 2012-01-19 NOTE — Progress Notes (Signed)
OFFICE PROGRESS NOTE  CC  [name],TERESA, MD [address] [place] [address] Dr. Chipper Herb Dr. 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 Essentially patient has bilateral breast cancers.  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   #2 patient has begun FEC 100 starting on 12/08/2011. A total of 4 cycles of this chemotherapy is planned and then thereafter she will proceed with Taxol as a single agent for total of 12 weeks.  CURRENT THERAPY patient is here for cycle #4 day 1 of FEC being given neoadjuvantly  INTERVAL HISTORY: Mary [name] 51 y.o. female returns for follow-up visit today.  She was doing well until about Thursday of last week she developed a maculopapular rash covering her arms and trunk.  It itched mildly.  She's been putting hydrocortisone cream, and benadryl cream on it, and taking benadryl at night, and it hasn't gotten any worse.  Interestingly enough, she switched lotions around this time as well.  Otherwise she is feeling well and w/o questions or concerns.    MEDICAL HISTORY: Past Medical History  Diagnosis Date  . Irritable bowel syndrome   . GERD (gastroesophageal reflux disease)     Nexium as needed  . Breast cancer August 2013    bilateral  . Dental crowns present     x 4  . Neutropenia, drug-induced 12/15/2011    ALLERGIES:   has no known allergies.  MEDICATIONS:  Current Outpatient Prescriptions  Medication Sig Dispense Refill  . ALPRAZolam (XANAX) 0.25 MG tablet Take 0.25 mg by mouth 3 (three) times daily as needed.       . Ascorbic Acid (VITAMIN C) 100 MG tablet Take 100 mg by mouth daily.        Marland Kitchen b complex vitamins tablet Take 1 tablet by mouth daily.       . calcium  carbonate (OS-CAL) 600 MG TABS Take 600 mg by mouth 2 (two) times daily with a meal.        . cholecalciferol (VITAMIN D) 400 UNITS TABS Take by mouth.      . ciprofloxacin (CIPRO) 500 MG tablet Take 1 tablet (500 mg total) by mouth 2 (two) times daily.  14 tablet  6  . dexamethasone (DECADRON) 4 MG tablet Take 2 tablets by mouth once a day on the day after chemotherapy and then take 2 tablets two times a day for 2 days. Take with food.  30 tablet  1  . lidocaine-prilocaine (EMLA) cream Apply topically as needed.  30 g  6  . loperamide (IMODIUM A-D) 2 MG tablet Take 2 mg by mouth 4 (four) times daily as needed.      Marland Kitchen LORazepam (ATIVAN) 0.5 MG tablet Take 1 tablet (0.5 mg total) by mouth every 6 (six) hours as needed (Nausea or vomiting).  30 tablet  0  . LORazepam (ATIVAN) 0.5 MG tablet Take 1 tablet (0.5 mg total) by mouth every 8 (eight) hours.  60 tablet  3  . Misc. Devices MISC 1 each by Does not apply route daily.  1 each  0  . Multiple Vitamin (MULTIVITAMIN) tablet Take 1 tablet by mouth daily.        Marland Kitchen NEXIUM 40 MG capsule TAKE 1 CAPSULE BY MOUTH  DAILY.  30 capsule  6  . ondansetron (ZOFRAN) 8 MG tablet Take 1 tablet two times a day as needed for nausea or vomiting starting on the third day after chemotherapy.  30 tablet  1  . prochlorperazine (COMPAZINE) 10 MG tablet Take 1 tablet (10 mg total) by mouth every 6 (six) hours as needed (Nausea or vomiting).  30 tablet  1  . prochlorperazine (COMPAZINE) 25 MG suppository Place 1 suppository (25 mg total) rectally every 12 (twelve) hours as needed for nausea.  12 suppository  3  . vitamin B-12 (CYANOCOBALAMIN) 100 MCG tablet Take 50 mcg by mouth daily.          SURGICAL HISTORY:  Past Surgical History  Procedure Date  . Breast lumpectomy 2008    left  . Portacath placement 12/02/2011    Procedure: INSERTION PORT-A-CATH;  Surgeon: Ernestene Mention, MD;  Location: [facility];  Service: General;  Laterality: N/A;  insertion port  a cath with fluoroscopy    REVIEW OF SYSTEMS:   General: fatigue (+), night sweats (-), fever (-), pain (-) Lymph: palpable nodes (-) HEENT: vision changes (-), mucositis (-), gum bleeding (-), epistaxis (-) Cardiovascular: chest pain (-), palpitations (-) Pulmonary: shortness of breath (-), dyspnea on exertion (-), cough (-), hemoptysis (-) GI:  Early satiety (-), melena (-), dysphagia (-), nausea/vomiting (-), diarrhea (-) GU: dysuria (-), hematuria (-), incontinence (-) Musculoskeletal: joint swelling (-), joint pain (-), back pain (-) Neuro: weakness (-), numbness (-), headache (-), confusion (-) Skin: Rash (+), lesions (-), dryness (-) Psych: depression (-), suicidal/homicidal ideation (-), feeling of hopelessness (-)   PHYSICAL EXAMINATION:  BP 125/83  Pulse 83  Temp 98.3 F (36.8 C) (Oral)  Resp 20  Ht 5\' 4"  (1.626 m)  Wt 159 lb 11.2 oz (72.439 kg)  BMI 27.41 kg/m2 General: Patient is a well appearing female in no acute distress HEENT: PERRLA, sclerae anicteric no conjunctival pallor, MMM Neck: supple, no palpable adenopathy Lungs: clear to auscultation bilaterally, no wheezes, rhonchi, or rales Cardiovascular: regular rate rhythm, S1, S2, no murmurs, rubs or gallops Abdomen: Soft, non-tender, non-distended, normoactive bowel sounds, no HSM Extremities: warm and well perfused, no clubbing, cyanosis, or edema Skin: No lesions.  Pt has a maculopapular rash on chest, back, and bilateral upper extremities. Neuro: Non-focal Breast: right breast with retracted nipple, and palpable mass, left breast, no masses or abnormalities ECOG PERFORMANCE STATUS: 0 - Asymptomatic   LABORATORY DATA: Lab Results  Component Value Date   WBC 11.1* 01/19/2012   HGB 11.0* 01/19/2012   HCT 32.8* 01/19/2012   MCV 94.8 01/19/2012   PLT 183 01/19/2012  ANC: 1000    Chemistry      Component Value Date/Time   NA 135* 01/12/2012 0850   NA 140 05/20/2011 1204   K 4.0 01/12/2012 0850   K  5.2 05/20/2011 1204   CL 103 01/12/2012 0850   CL 108 05/20/2011 1204   CO2 24 01/12/2012 0850   CO2 22 05/20/2011 1204   BUN 16.0 01/12/2012 0850   BUN 10 05/20/2011 1204   CREATININE 0.7 01/12/2012 0850   CREATININE 0.82 05/20/2011 1204   CREATININE 0.9 01/18/2011 1202      Component Value Date/Time   CALCIUM 9.3 01/12/2012 0850   CALCIUM 8.6 05/20/2011 1204   ALKPHOS 118 01/12/2012 0850   ALKPHOS 80 05/20/2011 1204   AST 6 01/12/2012 0850   AST 21 05/20/2011 1204   ALT 10 01/12/2012 0850  ALT 29 05/20/2011 1204   BILITOT 0.40 01/12/2012 0850   BILITOT 0.3 05/20/2011 1204       RADIOGRAPHIC STUDIES:  ASSESSMENT: 50 year old female with  #1 T3, N1, 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 Patient is now going to receive neoadjuvant chemotherapy consisting of FEC 100. She will receive dose dense FEC with day 2 Neulasta. She is overall doing well she understands the rationale for neoadjuvant treatment she understands the side effects of her chemotherapy. She also has her antiemetics already at her disposal to take as directed.  #2 Cycle 4 day 1 of FEC with Neulasta support.    #3 Rash  PLAN:  #1 Ms. Benedum is feeling well and her labs look good. She will proceed with chemotherapy today.    #2This rash is likely due to her change in lotion.  I have recommended her try Cetaphil lotion instead.    #3 she will return in one week's time for interim labs.    All questions were answered. The patient knows to call the clinic with any problems, questions or concerns. We can certainly see the patient much sooner if necessary.  I spent 25 minutes counseling the patient face to face. The total time spent in the appointment was 30 minutes.  This case was reviewed with Dr. Welton Flakes.  Mary Ouch Lyn Hollingshead, NP Medical Oncology [facility] Phone: [phone]  01/19/2012, 9:31 AM

## 2012-01-19 NOTE — Patient Instructions (Addendum)
Doing well.  Proceed with Chemo.  We will see you back next week for labs.  Stop using your aveeno lotion and try Cetaphil lotion.  Please call with any concerns.

## 2012-01-20 ENCOUNTER — Ambulatory Visit (HOSPITAL_BASED_OUTPATIENT_CLINIC_OR_DEPARTMENT_OTHER): Payer: 59

## 2012-01-20 VITALS — BP 125/82 | HR 98 | Temp 98.0°F

## 2012-01-20 DIAGNOSIS — C50419 Malignant neoplasm of upper-outer quadrant of unspecified female breast: Secondary | ICD-10-CM

## 2012-01-20 DIAGNOSIS — Z5189 Encounter for other specified aftercare: Secondary | ICD-10-CM

## 2012-01-20 MED ORDER — PEGFILGRASTIM INJECTION 6 MG/0.6ML
6.0000 mg | Freq: Once | SUBCUTANEOUS | Status: AC
  Administered 2012-01-20: 6 mg via SUBCUTANEOUS
  Filled 2012-01-20: qty 0.6

## 2012-01-24 ENCOUNTER — Other Ambulatory Visit: Payer: Self-pay | Admitting: Oncology

## 2012-01-24 DIAGNOSIS — C50419 Malignant neoplasm of upper-outer quadrant of unspecified female breast: Secondary | ICD-10-CM

## 2012-01-24 MED ORDER — PROCHLORPERAZINE 25 MG RE SUPP: 25.0000 mg | Freq: Two times a day (BID) | RECTAL | Status: DC | PRN

## 2012-01-24 MED ORDER — DEXAMETHASONE 4 MG PO TABS: 8.0000 mg | ORAL_TABLET | Freq: Two times a day (BID) | ORAL | Status: DC

## 2012-01-24 MED ORDER — PROCHLORPERAZINE MALEATE 10 MG PO TABS: 10.0000 mg | ORAL_TABLET | Freq: Four times a day (QID) | ORAL | Status: DC | PRN

## 2012-01-24 MED ORDER — ONDANSETRON HCL 8 MG PO TABS: 8.0000 mg | ORAL_TABLET | Freq: Two times a day (BID) | ORAL | Status: DC

## 2012-01-24 MED ORDER — LORAZEPAM 0.5 MG PO TABS: 0.5000 mg | ORAL_TABLET | Freq: Four times a day (QID) | ORAL | Status: DC | PRN

## 2012-01-26 ENCOUNTER — Telehealth: Payer: Self-pay | Admitting: Oncology

## 2012-01-26 ENCOUNTER — Encounter: Payer: Self-pay | Admitting: Oncology

## 2012-01-26 ENCOUNTER — Other Ambulatory Visit (HOSPITAL_BASED_OUTPATIENT_CLINIC_OR_DEPARTMENT_OTHER): Payer: 59 | Admitting: Lab

## 2012-01-26 ENCOUNTER — Ambulatory Visit (HOSPITAL_BASED_OUTPATIENT_CLINIC_OR_DEPARTMENT_OTHER): Payer: 59 | Admitting: Oncology

## 2012-01-26 VITALS — BP 135/80 | HR 98 | Temp 98.7°F | Resp 20 | Ht 64.0 in | Wt 159.4 lb

## 2012-01-26 DIAGNOSIS — D0590 Unspecified type of carcinoma in situ of unspecified breast: Secondary | ICD-10-CM

## 2012-01-26 DIAGNOSIS — C50419 Malignant neoplasm of upper-outer quadrant of unspecified female breast: Secondary | ICD-10-CM

## 2012-01-26 DIAGNOSIS — D709 Neutropenia, unspecified: Secondary | ICD-10-CM

## 2012-01-26 LAB — CBC WITH DIFFERENTIAL/PLATELET
Basophils Absolute: 0 10*3/uL (ref 0.0–0.1)
EOS%: 0.4 % (ref 0.0–7.0)
Eosinophils Absolute: 0 10*3/uL (ref 0.0–0.5)
HGB: 10 g/dL — ABNORMAL LOW (ref 11.6–15.9)
LYMPH%: 34.8 % (ref 14.0–49.7)
MCH: 32.9 pg (ref 25.1–34.0)
MCV: 95.1 fL (ref 79.5–101.0)
MONO%: 6.4 % (ref 0.0–14.0)
NEUT#: 0.8 10*3/uL — ABNORMAL LOW (ref 1.5–6.5)
Platelets: 98 10*3/uL — ABNORMAL LOW (ref 145–400)
RBC: 3.04 10*6/uL — ABNORMAL LOW (ref 3.70–5.45)

## 2012-01-26 LAB — COMPREHENSIVE METABOLIC PANEL (CC13)
AST: 7 U/L (ref 5–34)
Alkaline Phosphatase: 122 U/L (ref 40–150)
BUN: 15 mg/dL (ref 7.0–26.0)
Glucose: 122 mg/dl — ABNORMAL HIGH (ref 70–99)
Total Bilirubin: 0.48 mg/dL (ref 0.20–1.20)

## 2012-01-26 NOTE — Patient Instructions (Addendum)
Take cipro 500 mg twice a day for 7 days  You will begin Taxol beginning on 11/14 for a 12 weeks  I will see you back on 11/14

## 2012-01-26 NOTE — Progress Notes (Signed)
OFFICE PROGRESS NOTE  CC  Mary [name],TERESA, MD [address] [place] [address] Dr. Chipper Herb Dr. 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 Essentially patient has bilateral breast cancers.  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   #2 patient has begun FEC 100 starting on 12/08/2011 - 01/18/12. A total of 4 cycles of this chemotherapy is planned and then thereafter she will proceed with Taxol as a single agent for total of 12 weeks.  #3 on 02/02/2012 patient will begin singly agent weekly Taxol for a total of 12 weeks.  CURRENT THERAPY interim labs  INTERVAL HISTORY: Mary [name] 51 y.o. female returns for follow-up visit today.  Patient's rash has resolved. She does feel weak tired and fatigued but that may be due to her counts being down. She has not had any fevers chills or night sweats. She denies any nausea vomiting no diarrhea or constipation no difficulty in swallowing. No bleeding. Her platelets are low today. Remainder of the 10 point review of systems is unremarkable.  MEDICAL HISTORY: Past Medical History  Diagnosis Date  . Irritable bowel syndrome   . GERD (gastroesophageal reflux disease)     Nexium as needed  . Breast cancer August 2013    bilateral  . Dental crowns present     x 4  . Neutropenia, drug-induced 12/15/2011    ALLERGIES:   has no known allergies.  MEDICATIONS:  Current Outpatient Prescriptions  Medication Sig Dispense Refill  . ALPRAZolam (XANAX) 0.25 MG tablet Take 0.25 mg by mouth 3 (three) times daily as needed.       . Ascorbic Acid (VITAMIN C) 100 MG tablet Take 100 mg by mouth daily.        Marland Kitchen b complex vitamins tablet Take 1 tablet by mouth daily.       . calcium  carbonate (OS-CAL) 600 MG TABS Take 600 mg by mouth 2 (two) times daily with a meal.        . cholecalciferol (VITAMIN D) 400 UNITS TABS Take by mouth.      . ciprofloxacin (CIPRO) 500 MG tablet Take 1 tablet (500 mg total) by mouth 2 (two) times daily.  14 tablet  6  . dexamethasone (DECADRON) 4 MG tablet Take 2 tablets (8 mg total) by mouth 2 (two) times daily with a meal. Take daily starting the day after chemotherapy for 2 days. Take with food.  30 tablet  1  . lidocaine-prilocaine (EMLA) cream Apply topically as needed.  30 g  6  . loperamide (IMODIUM A-D) 2 MG tablet Take 2 mg by mouth 4 (four) times daily as needed.      Marland Kitchen LORazepam (ATIVAN) 0.5 MG tablet Take 1 tablet (0.5 mg total) by mouth every 8 (eight) hours.  60 tablet  3  . LORazepam (ATIVAN) 0.5 MG tablet Take 1 tablet (0.5 mg total) by mouth every 6 (six) hours as needed (Nausea or vomiting).  30 tablet  0  . Misc. Devices MISC 1 each by Does not apply route daily.  1 each  0  . Multiple Vitamin (MULTIVITAMIN) tablet Take 1 tablet by mouth daily.        Marland Kitchen NEXIUM 40 MG capsule TAKE 1 CAPSULE BY MOUTH DAILY.  30 capsule  6  . ondansetron (ZOFRAN) 8 MG tablet Take 1 tablet (8 mg total) by mouth 2 (two) times daily. Take two times a day starting the day after chemo for 2 days. Then take two times a day as needed for nausea or vomiting.  30 tablet  1  . prochlorperazine (COMPAZINE) 10 MG tablet Take 1 tablet (10 mg total) by mouth every 6 (six) hours as needed (Nausea or vomiting).  30 tablet  1  . prochlorperazine (COMPAZINE) 25 MG suppository Place 1 suppository (25 mg total) rectally every 12 (twelve) hours as needed for nausea.  12 suppository  3  . vitamin B-12 (CYANOCOBALAMIN) 100 MCG tablet Take 50 mcg by mouth daily.          SURGICAL HISTORY:  Past Surgical History  Procedure Date  . Breast lumpectomy 2008    left  . Portacath placement 12/02/2011    Procedure: INSERTION PORT-A-CATH;  Surgeon: Ernestene Mention, MD;   Location: [facility];  Service: General;  Laterality: N/A;  insertion port a cath with fluoroscopy    REVIEW OF SYSTEMS:   General: fatigue (+), night sweats (-), fever (-), pain (-) Lymph: palpable nodes (-) HEENT: vision changes (-), mucositis (-), gum bleeding (-), epistaxis (-) Cardiovascular: chest pain (-), palpitations (-) Pulmonary: shortness of breath (-), dyspnea on exertion (-), cough (-), hemoptysis (-) GI:  Early satiety (-), melena (-), dysphagia (-), nausea/vomiting (-), diarrhea (-) GU: dysuria (-), hematuria (-), incontinence (-) Musculoskeletal: joint swelling (-), joint pain (-), back pain (-) Neuro: weakness (-), numbness (-), headache (-), confusion (-) Skin: Rash (+), lesions (-), dryness (-) Psych: depression (-), suicidal/homicidal ideation (-), feeling of hopelessness (-)   PHYSICAL EXAMINATION:  BP 135/80  Pulse 98  Temp 98.7 F (37.1 C) (Oral)  Resp 20  Ht 5\' 4"  (1.626 m)  Wt 159 lb 6.4 oz (72.303 kg)  BMI 27.36 kg/m2 General: Patient is a well appearing female in no acute distress HEENT: PERRLA, sclerae anicteric no conjunctival pallor, MMM Neck: supple, no palpable adenopathy Lungs: clear to auscultation bilaterally, no wheezes, rhonchi, or rales Cardiovascular: regular rate rhythm, S1, S2, no murmurs, rubs or gallops Abdomen: Soft, non-tender, non-distended, normoactive bowel sounds, no HSM Extremities: warm and well perfused, no clubbing, cyanosis, or edema Skin: No lesions.  Pt has a maculopapular rash on chest, back, and bilateral upper extremities. Neuro: Non-focal Breast: right breast with retracted nipple, and palpable mass, left breast, no masses or abnormalities ECOG PERFORMANCE STATUS: 0 - Asymptomatic   LABORATORY DATA: Lab Results  Component Value Date   WBC 1.4* 01/26/2012   HGB 10.0* 01/26/2012   HCT 28.9* 01/26/2012   MCV 95.1 01/26/2012   PLT 98* 01/26/2012  ANC: 1000    Chemistry      Component Value Date/Time     NA 137 01/26/2012 1040   NA 140 05/20/2011 1204   K 3.9 01/26/2012 1040   K 5.2 05/20/2011 1204   CL 105 01/26/2012 1040   CL 108 05/20/2011 1204   CO2 28 01/26/2012 1040   CO2 22 05/20/2011 1204   BUN 15.0 01/26/2012 1040   BUN 10 05/20/2011 1204   CREATININE 0.7 01/26/2012 1040   CREATININE 0.82 05/20/2011 1204   CREATININE 0.9 01/18/2011 1202      Component Value Date/Time   CALCIUM 9.4 01/26/2012 1040   CALCIUM 8.6 05/20/2011 1204   ALKPHOS 122 01/26/2012 1040   ALKPHOS 80 05/20/2011 1204  AST 7 01/26/2012 1040   AST 21 05/20/2011 1204   ALT 15 01/26/2012 1040   ALT 29 05/20/2011 1204   BILITOT 0.48 01/26/2012 1040   BILITOT 0.3 05/20/2011 1204       RADIOGRAPHIC STUDIES:  ASSESSMENT: 51 year old female with  #1 T3, N1, 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 Patient is now going to receive neoadjuvant chemotherapy consisting of FEC 100. She will receive dose dense FEC with day 2 Neulasta. She is overall doing well she understands the rationale for neoadjuvant treatment she understands the side effects of her chemotherapy. She also has her antiemetics already at her disposal to take as directed.  #2 patient has now completed 4 cycles of neoadjuvant FEC 100.  #3 she will begin neoadjuvant single agent Taxol weekly beginning 02/02/2012.  #4 patient is neutropenic but afebrile. She will begin prophylactic Cipro 500 mg twice a day for 7 days.     PLAN:  #1 overall patient tolerated her 4 cycles of FEC 100 very well. She has now completed this.  #2 starting next week she will begin single agent Taxol for a total of 12 weeks.  #3 patient is neutropenic and we'll begin Cipro. She will call with any problems questions or concerns. All questions were answered. The patient knows to call the clinic with any problems, questions or concerns. We can certainly see the patient much sooner if necessary.  I spent 25 minutes counseling the patient face to face. The  total time spent in the appointment was 30 minutes.  Drue Second, MD Medical/Oncology [facility] [phone] (beeper) [phone] (Office)  01/26/2012, 1:35 PM

## 2012-01-26 NOTE — Telephone Encounter (Signed)
gve the pt her nov,dec 2013 appt calendar. Pt is aware that her tx appts will be added. Sent michelle a staff message to add the weekly taxol appts

## 2012-01-27 ENCOUNTER — Telehealth: Payer: Self-pay | Admitting: Oncology

## 2012-01-27 NOTE — Telephone Encounter (Signed)
S/w michelle brown regarding the pt needing weekly taxol appts

## 2012-02-02 ENCOUNTER — Telehealth: Payer: Self-pay | Admitting: Oncology

## 2012-02-02 ENCOUNTER — Ambulatory Visit (HOSPITAL_BASED_OUTPATIENT_CLINIC_OR_DEPARTMENT_OTHER): Payer: 59

## 2012-02-02 ENCOUNTER — Encounter: Payer: Self-pay | Admitting: Oncology

## 2012-02-02 ENCOUNTER — Ambulatory Visit (HOSPITAL_BASED_OUTPATIENT_CLINIC_OR_DEPARTMENT_OTHER): Payer: 59 | Admitting: Oncology

## 2012-02-02 ENCOUNTER — Other Ambulatory Visit: Payer: 59 | Admitting: Lab

## 2012-02-02 ENCOUNTER — Ambulatory Visit: Payer: 59 | Admitting: Oncology

## 2012-02-02 ENCOUNTER — Other Ambulatory Visit (HOSPITAL_BASED_OUTPATIENT_CLINIC_OR_DEPARTMENT_OTHER): Payer: 59 | Admitting: Lab

## 2012-02-02 VITALS — BP 122/77 | HR 87 | Temp 98.4°F | Resp 20 | Ht 64.0 in | Wt 159.4 lb

## 2012-02-02 VITALS — BP 125/79 | HR 85 | Temp 98.5°F | Resp 18

## 2012-02-02 DIAGNOSIS — R21 Rash and other nonspecific skin eruption: Secondary | ICD-10-CM

## 2012-02-02 DIAGNOSIS — D0590 Unspecified type of carcinoma in situ of unspecified breast: Secondary | ICD-10-CM

## 2012-02-02 DIAGNOSIS — Z5111 Encounter for antineoplastic chemotherapy: Secondary | ICD-10-CM

## 2012-02-02 DIAGNOSIS — C50419 Malignant neoplasm of upper-outer quadrant of unspecified female breast: Secondary | ICD-10-CM

## 2012-02-02 DIAGNOSIS — C50919 Malignant neoplasm of unspecified site of unspecified female breast: Secondary | ICD-10-CM

## 2012-02-02 LAB — CBC WITH DIFFERENTIAL/PLATELET
Basophils Absolute: 0.1 10*3/uL (ref 0.0–0.1)
Eosinophils Absolute: 0 10*3/uL (ref 0.0–0.5)
HCT: 30.8 % — ABNORMAL LOW (ref 34.8–46.6)
HGB: 10.2 g/dL — ABNORMAL LOW (ref 11.6–15.9)
LYMPH%: 8 % — ABNORMAL LOW (ref 14.0–49.7)
MCV: 97.2 fL (ref 79.5–101.0)
MONO%: 7.5 % (ref 0.0–14.0)
NEUT#: 10.2 10*3/uL — ABNORMAL HIGH (ref 1.5–6.5)
NEUT%: 83.7 % — ABNORMAL HIGH (ref 38.4–76.8)
Platelets: 202 10*3/uL (ref 145–400)
RDW: 17.1 % — ABNORMAL HIGH (ref 11.2–14.5)

## 2012-02-02 LAB — COMPREHENSIVE METABOLIC PANEL (CC13)
BUN: 14 mg/dL (ref 7.0–26.0)
CO2: 24 mEq/L (ref 22–29)
Glucose: 99 mg/dl (ref 70–99)
Sodium: 139 mEq/L (ref 136–145)
Total Bilirubin: 0.24 mg/dL (ref 0.20–1.20)
Total Protein: 6.3 g/dL — ABNORMAL LOW (ref 6.4–8.3)

## 2012-02-02 MED ORDER — PACLITAXEL CHEMO INJECTION 300 MG/50ML
80.0000 mg/m2 | Freq: Once | INTRAVENOUS | Status: AC
  Administered 2012-02-02: 144 mg via INTRAVENOUS
  Filled 2012-02-02: qty 24

## 2012-02-02 MED ORDER — FAMOTIDINE IN NACL 20-0.9 MG/50ML-% IV SOLN
20.0000 mg | Freq: Once | INTRAVENOUS | Status: AC
  Administered 2012-02-02: 20 mg via INTRAVENOUS

## 2012-02-02 MED ORDER — HEPARIN SOD (PORK) LOCK FLUSH 100 UNIT/ML IV SOLN
500.0000 [IU] | Freq: Once | INTRAVENOUS | Status: AC | PRN
  Administered 2012-02-02: 500 [IU]
  Filled 2012-02-02: qty 5

## 2012-02-02 MED ORDER — DIPHENHYDRAMINE HCL 50 MG/ML IJ SOLN
50.0000 mg | Freq: Once | INTRAMUSCULAR | Status: AC
  Administered 2012-02-02: 50 mg via INTRAVENOUS

## 2012-02-02 MED ORDER — DEXAMETHASONE SODIUM PHOSPHATE 4 MG/ML IJ SOLN
20.0000 mg | Freq: Once | INTRAMUSCULAR | Status: AC
  Administered 2012-02-02: 20 mg via INTRAVENOUS

## 2012-02-02 MED ORDER — SODIUM CHLORIDE 0.9 % IJ SOLN
10.0000 mL | INTRAMUSCULAR | Status: DC | PRN
  Administered 2012-02-02: 10 mL
  Filled 2012-02-02: qty 10

## 2012-02-02 MED ORDER — SODIUM CHLORIDE 0.9 % IV SOLN
Freq: Once | INTRAVENOUS | Status: AC
  Administered 2012-02-02: 12:00:00 via INTRAVENOUS

## 2012-02-02 MED ORDER — ONDANSETRON 8 MG/50ML IVPB (CHCC)
8.0000 mg | Freq: Once | INTRAVENOUS | Status: AC
  Administered 2012-02-02: 8 mg via INTRAVENOUS

## 2012-02-02 NOTE — Progress Notes (Signed)
OFFICE PROGRESS NOTE  CC  TULLO,TERESA, MD [address] [place] [address] Dr. Chipper Herb Dr. 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 Essentially patient has bilateral breast cancers.  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   #2 patient has begun FEC 100 starting on 12/08/2011 - 01/18/12. A total of 4 cycles of this chemotherapy is planned and then thereafter she will proceed with Taxol as a single agent for total of 12 weeks.  #3 on 02/02/2012 patient will begin singly agent weekly Taxol for a total of 12 weeks.  CURRENT THERAPY weekly 1/12 of Taxol.  INTERVAL HISTORY: Mary [name] 51 y.o. female returns for follow-up visit today. She developed a rash over the last few days. She has not had any fevers chills or night sweats. She notes that the rash occurs sometimes with Cipro sometimes with the chemotherapy possibility of steroids is also entertained. She has not changed any detergents soaps or perfumes. Patient otherwise feels well no nausea vomiting fevers chills no night sweats no headaches no shortness of breath no chest pains or palpitations no peripheral paresthesias. No skin desquamation. Remainder of the 10 point review of systems is negative.  MEDICAL HISTORY: Past Medical History  Diagnosis Date  . Irritable bowel syndrome   . GERD (gastroesophageal reflux disease)     Nexium as needed  . Breast cancer August 2013    bilateral  . Dental crowns present     x 4  . Neutropenia, drug-induced 12/15/2011    ALLERGIES:   has no known allergies.  MEDICATIONS:  Current Outpatient Prescriptions  Medication Sig Dispense Refill  . ALPRAZolam (XANAX) 0.25 MG tablet Take 0.25 mg by mouth 3  (three) times daily as needed.       . Ascorbic Acid (VITAMIN C) 100 MG tablet Take 100 mg by mouth daily.        Marland Kitchen b complex vitamins tablet Take 1 tablet by mouth daily.       . calcium carbonate (OS-CAL) 600 MG TABS Take 600 mg by mouth 2 (two) times daily with a meal.        . cholecalciferol (VITAMIN D) 400 UNITS TABS Take by mouth.      . ciprofloxacin (CIPRO) 500 MG tablet Take 1 tablet (500 mg total) by mouth 2 (two) times daily.  14 tablet  6  . dexamethasone (DECADRON) 4 MG tablet Take 2 tablets (8 mg total) by mouth 2 (two) times daily with a meal. Take daily starting the day after chemotherapy for 2 days. Take with food.  30 tablet  1  . lidocaine-prilocaine (EMLA) cream Apply topically as needed.  30 g  6  . loperamide (IMODIUM A-D) 2 MG tablet Take 2 mg by mouth 4 (four) times daily as needed.      Marland Kitchen LORazepam (ATIVAN) 0.5 MG tablet Take 1 tablet (0.5 mg total) by mouth every 8 (eight) hours.  60 tablet  3  . LORazepam (ATIVAN) 0.5 MG tablet Take 1 tablet (0.5 mg total) by mouth every 6 (six) hours as needed (Nausea or vomiting).  30 tablet  0  . Misc. Devices MISC 1 each by Does not apply route daily.  1 each  0  . Multiple Vitamin (  MULTIVITAMIN) tablet Take 1 tablet by mouth daily.        Marland Kitchen NEXIUM 40 MG capsule TAKE 1 CAPSULE BY MOUTH DAILY.  30 capsule  6  . ondansetron (ZOFRAN) 8 MG tablet Take 1 tablet (8 mg total) by mouth 2 (two) times daily. Take two times a day starting the day after chemo for 2 days. Then take two times a day as needed for nausea or vomiting.  30 tablet  1  . prochlorperazine (COMPAZINE) 10 MG tablet Take 1 tablet (10 mg total) by mouth every 6 (six) hours as needed (Nausea or vomiting).  30 tablet  1  . prochlorperazine (COMPAZINE) 25 MG suppository Place 1 suppository (25 mg total) rectally every 12 (twelve) hours as needed for nausea.  12 suppository  3  . vitamin B-12 (CYANOCOBALAMIN) 100 MCG tablet Take 50 mcg by mouth daily.          SURGICAL  HISTORY:  Past Surgical History  Procedure Date  . Breast lumpectomy 2008    left  . Portacath placement 12/02/2011    Procedure: INSERTION PORT-A-CATH;  Surgeon: Ernestene Mention, MD;  Location: [facility];  Service: General;  Laterality: N/A;  insertion port a cath with fluoroscopy    REVIEW OF SYSTEMS:   General: fatigue (+), night sweats (-), fever (-), pain (-) Lymph: palpable nodes (-) HEENT: vision changes (-), mucositis (-), gum bleeding (-), epistaxis (-) Cardiovascular: chest pain (-), palpitations (-) Pulmonary: shortness of breath (-), dyspnea on exertion (-), cough (-), hemoptysis (-) GI:  Early satiety (-), melena (-), dysphagia (-), nausea/vomiting (-), diarrhea (-) GU: dysuria (-), hematuria (-), incontinence (-) Musculoskeletal: joint swelling (-), joint pain (-), back pain (-) Neuro: weakness (-), numbness (-), headache (-), confusion (-) Skin: Rash (+), lesions (-), dryness (-) Psych: depression (-), suicidal/homicidal ideation (-), feeling of hopelessness (-)   PHYSICAL EXAMINATION:  BP 122/77  Pulse 87  Temp 98.4 F (36.9 C) (Oral)  Resp 20  Ht 5\' 4"  (1.626 m)  Wt 159 lb 6.4 oz (72.303 kg)  BMI 27.36 kg/m2 General: Patient is a well appearing female in no acute distress HEENT: PERRLA, sclerae anicteric no conjunctival pallor, MMM Neck: supple, no palpable adenopathy Lungs: clear to auscultation bilaterally, no wheezes, rhonchi, or rales Cardiovascular: regular rate rhythm, S1, S2, no murmurs, rubs or gallops Abdomen: Soft, non-tender, non-distended, normoactive bowel sounds, no HSM Extremities: warm and well perfused, no clubbing, cyanosis, or edema Skin: No lesions.  Pt has a maculopapular rash on chest, back, and bilateral upper extremities. Neuro: Non-focal Breast: right breast with retracted nipple, and palpable mass, left breast, no masses or abnormalities, the breast is much softer and the mass is now mobile ECOG PERFORMANCE STATUS: 0  - Asymptomatic   LABORATORY DATA: Lab Results  Component Value Date   WBC 12.2* 02/02/2012   HGB 10.2* 02/02/2012   HCT 30.8* 02/02/2012   MCV 97.2 02/02/2012   PLT 202 02/02/2012  ANC: 1000    Chemistry      Component Value Date/Time   NA 137 01/26/2012 1040   NA 140 05/20/2011 1204   K 3.9 01/26/2012 1040   K 5.2 05/20/2011 1204   CL 105 01/26/2012 1040   CL 108 05/20/2011 1204   CO2 28 01/26/2012 1040   CO2 22 05/20/2011 1204   BUN 15.0 01/26/2012 1040   BUN 10 05/20/2011 1204   CREATININE 0.7 01/26/2012 1040   CREATININE 0.82 05/20/2011 1204   CREATININE  0.9 01/18/2011 1202      Component Value Date/Time   CALCIUM 9.4 01/26/2012 1040   CALCIUM 8.6 05/20/2011 1204   ALKPHOS 122 01/26/2012 1040   ALKPHOS 80 05/20/2011 1204   AST 7 01/26/2012 1040   AST 21 05/20/2011 1204   ALT 15 01/26/2012 1040   ALT 29 05/20/2011 1204   BILITOT 0.48 01/26/2012 1040   BILITOT 0.3 05/20/2011 1204       RADIOGRAPHIC STUDIES:  ASSESSMENT: 51 year old female with  #1 T3, N1, 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 Patient is now going to receive neoadjuvant chemotherapy consisting of FEC 100. She will receive dose dense FEC with day 2 Neulasta. She is overall doing well she understands the rationale for neoadjuvant treatment she understands the side effects of her chemotherapy. She also has her antiemetics already at her disposal to take as directed.  #2 patient has now completed 4 cycles of neoadjuvant FEC 100.  #3 she will begin now again neoadjuvant single agent Taxol weekly beginning 02/02/2012.    PLAN:  #1 patient will proceed with cycle 1 of Taxol today.  #2 patient does have a rash unclear etiology I do think we may eventually have send her to the dermatologist if this does not improve  #3 patient would like to move all of her chemotherapy is to Fridays and I have tried to fix is scheduled today.    All questions were answered. The patient knows to call  the clinic with any problems, questions or concerns. We can certainly see the patient much sooner if necessary.  I spent 25 minutes counseling the patient face to face. The total time spent in the appointment was 30 minutes.  Drue Second, MD Medical/Oncology [facility] [phone] (beeper) [phone] (Office)  02/02/2012, 11:09 AM

## 2012-02-02 NOTE — Patient Instructions (Addendum)
[facility] Discharge Instructions for Patients Receiving Chemotherapy  Today you received the following chemotherapy agents Taxol  To help prevent nausea and vomiting after your treatment, we encourage you to take your nausea medication as prescribed.  If you develop nausea and vomiting that is not controlled by your nausea medication, call the clinic. If it is after clinic hours your family physician or the after hours number for the clinic or go to the Emergency Department.   BELOW ARE SYMPTOMS THAT SHOULD BE REPORTED IMMEDIATELY:  *FEVER GREATER THAN 100.5 F  *CHILLS WITH OR WITHOUT FEVER  NAUSEA AND VOMITING THAT IS NOT CONTROLLED WITH YOUR NAUSEA MEDICATION  *UNUSUAL SHORTNESS OF BREATH  *UNUSUAL BRUISING OR BLEEDING  TENDERNESS IN MOUTH AND THROAT WITH OR WITHOUT PRESENCE OF ULCERS  *URINARY PROBLEMS  *BOWEL PROBLEMS  UNUSUAL RASH Items with * indicate a potential emergency and should be followed up as soon as possible.  One of the nurses will contact you 24 hours after your treatment. Please let the nurse know about any problems that you may have experienced. Feel free to call the clinic you have any questions or concerns. The clinic phone number is [phone].   I have been informed and understand all the instructions given to me. I know to contact the clinic, my physician, or go to the Emergency Department if any problems should occur. I do not have any questions at this time, but understand that I may call the clinic during office hours   should I have any questions or need assistance in obtaining follow up care.    __________________________________________  _____________  __________ Signature of Patient or Authorized Representative            Date                   Time    __________________________________________ Nurse's Signature    

## 2012-02-02 NOTE — Patient Instructions (Addendum)
Proceed with taxol today  Return on 11/22 for follow up and next chemotherapy

## 2012-02-02 NOTE — Telephone Encounter (Signed)
Sent michelle a staff message to add the pt chemo appts. °

## 2012-02-06 ENCOUNTER — Encounter: Payer: Self-pay | Admitting: Oncology

## 2012-02-06 ENCOUNTER — Telehealth: Payer: Self-pay | Admitting: Oncology

## 2012-02-06 NOTE — Progress Notes (Signed)
Faxed critical illness claim form to [facility] @ [phone].

## 2012-02-06 NOTE — Telephone Encounter (Signed)
lmonvm adviisng the pt of her 02/10/2012 appts and to pick up a schedule at that time.

## 2012-02-08 ENCOUNTER — Other Ambulatory Visit: Payer: 59 | Admitting: Lab

## 2012-02-08 ENCOUNTER — Ambulatory Visit: Payer: 59 | Admitting: Adult Health

## 2012-02-08 ENCOUNTER — Ambulatory Visit: Payer: 59

## 2012-02-09 ENCOUNTER — Ambulatory Visit: Payer: 59

## 2012-02-10 ENCOUNTER — Ambulatory Visit (HOSPITAL_BASED_OUTPATIENT_CLINIC_OR_DEPARTMENT_OTHER): Payer: 59 | Admitting: Adult Health

## 2012-02-10 ENCOUNTER — Encounter: Payer: Self-pay | Admitting: Adult Health

## 2012-02-10 ENCOUNTER — Other Ambulatory Visit (HOSPITAL_BASED_OUTPATIENT_CLINIC_OR_DEPARTMENT_OTHER): Payer: 59 | Admitting: Lab

## 2012-02-10 ENCOUNTER — Ambulatory Visit (HOSPITAL_BASED_OUTPATIENT_CLINIC_OR_DEPARTMENT_OTHER): Payer: 59

## 2012-02-10 VITALS — BP 117/79 | HR 83 | Temp 98.6°F | Resp 20 | Ht 64.0 in | Wt 159.5 lb

## 2012-02-10 DIAGNOSIS — Z5111 Encounter for antineoplastic chemotherapy: Secondary | ICD-10-CM

## 2012-02-10 DIAGNOSIS — C50419 Malignant neoplasm of upper-outer quadrant of unspecified female breast: Secondary | ICD-10-CM

## 2012-02-10 DIAGNOSIS — D0590 Unspecified type of carcinoma in situ of unspecified breast: Secondary | ICD-10-CM

## 2012-02-10 LAB — COMPREHENSIVE METABOLIC PANEL (CC13)
AST: 19 U/L (ref 5–34)
Albumin: 3.6 g/dL (ref 3.5–5.0)
BUN: 12 mg/dL (ref 7.0–26.0)
Calcium: 9.3 mg/dL (ref 8.4–10.4)
Chloride: 108 mEq/L — ABNORMAL HIGH (ref 98–107)
Glucose: 92 mg/dl (ref 70–99)
Potassium: 4.2 mEq/L (ref 3.5–5.1)

## 2012-02-10 LAB — CBC WITH DIFFERENTIAL/PLATELET
Basophils Absolute: 0.1 10*3/uL (ref 0.0–0.1)
Eosinophils Absolute: 0.1 10*3/uL (ref 0.0–0.5)
HCT: 29.8 % — ABNORMAL LOW (ref 34.8–46.6)
HGB: 9.6 g/dL — ABNORMAL LOW (ref 11.6–15.9)
LYMPH%: 16.3 % (ref 14.0–49.7)
MCHC: 32.2 g/dL (ref 31.5–36.0)
MONO#: 0.7 10*3/uL (ref 0.1–0.9)
NEUT#: 4.5 10*3/uL (ref 1.5–6.5)
NEUT%: 70.1 % (ref 38.4–76.8)
Platelets: 409 10*3/uL — ABNORMAL HIGH (ref 145–400)
WBC: 6.4 10*3/uL (ref 3.9–10.3)
lymph#: 1 10*3/uL (ref 0.9–3.3)

## 2012-02-10 MED ORDER — ONDANSETRON 8 MG/50ML IVPB (CHCC)
8.0000 mg | Freq: Once | INTRAVENOUS | Status: AC
  Administered 2012-02-10: 8 mg via INTRAVENOUS

## 2012-02-10 MED ORDER — DIPHENHYDRAMINE HCL 50 MG/ML IJ SOLN
50.0000 mg | Freq: Once | INTRAMUSCULAR | Status: AC
  Administered 2012-02-10: 50 mg via INTRAVENOUS

## 2012-02-10 MED ORDER — FAMOTIDINE IN NACL 20-0.9 MG/50ML-% IV SOLN
20.0000 mg | Freq: Once | INTRAVENOUS | Status: AC
  Administered 2012-02-10: 20 mg via INTRAVENOUS

## 2012-02-10 MED ORDER — SODIUM CHLORIDE 0.9 % IJ SOLN
10.0000 mL | INTRAMUSCULAR | Status: DC | PRN
  Administered 2012-02-10: 10 mL
  Filled 2012-02-10: qty 10

## 2012-02-10 MED ORDER — HEPARIN SOD (PORK) LOCK FLUSH 100 UNIT/ML IV SOLN
500.0000 [IU] | Freq: Once | INTRAVENOUS | Status: AC | PRN
  Administered 2012-02-10: 500 [IU]
  Filled 2012-02-10: qty 5

## 2012-02-10 MED ORDER — PACLITAXEL CHEMO INJECTION 300 MG/50ML
80.0000 mg/m2 | Freq: Once | INTRAVENOUS | Status: AC
  Administered 2012-02-10: 144 mg via INTRAVENOUS
  Filled 2012-02-10: qty 24

## 2012-02-10 MED ORDER — SODIUM CHLORIDE 0.9 % IV SOLN
Freq: Once | INTRAVENOUS | Status: AC
  Administered 2012-02-10: 11:00:00 via INTRAVENOUS

## 2012-02-10 MED ORDER — DEXAMETHASONE SODIUM PHOSPHATE 4 MG/ML IJ SOLN
20.0000 mg | Freq: Once | INTRAMUSCULAR | Status: AC
  Administered 2012-02-10: 20 mg via INTRAVENOUS

## 2012-02-10 NOTE — Patient Instructions (Addendum)
Doing well.  Proceed with chemotherapy.  Please call us if you have any questions or concerns.   We will see you back next week for cycle 3.

## 2012-02-10 NOTE — Patient Instructions (Addendum)
[facility] Discharge Instructions for Patients Receiving Chemotherapy  Today you received the following chemotherapy agents :  Taxol.  To help prevent nausea and vomiting after your treatment, we encourage you to take your nausea medication as instructed by your physician.    If you develop nausea and vomiting that is not controlled by your nausea medication, call the clinic. If it is after clinic hours your family physician or the after hours number for the clinic or go to the Emergency Department.   BELOW ARE SYMPTOMS THAT SHOULD BE REPORTED IMMEDIATELY:  *FEVER GREATER THAN 100.5 F  *CHILLS WITH OR WITHOUT FEVER  NAUSEA AND VOMITING THAT IS NOT CONTROLLED WITH YOUR NAUSEA MEDICATION  *UNUSUAL SHORTNESS OF BREATH  *UNUSUAL BRUISING OR BLEEDING  TENDERNESS IN MOUTH AND THROAT WITH OR WITHOUT PRESENCE OF ULCERS  *URINARY PROBLEMS  *BOWEL PROBLEMS  UNUSUAL RASH Items with * indicate a potential emergency and should be followed up as soon as possible.  One of the nurses will contact you 24 hours after your treatment. Please let the nurse know about any problems that you may have experienced. Feel free to call the clinic you have any questions or concerns. The clinic phone number is [phone].   I have been informed and understand all the instructions given to me. I know to contact the clinic, my physician, or go to the Emergency Department if any problems should occur. I do not have any questions at this time, but understand that I may call the clinic during office hours   should I have any questions or need assistance in obtaining follow up care.    __________________________________________  _____________  __________ Signature of Patient or Authorized Representative            Date                   Time    __________________________________________ Nurse's Signature    

## 2012-02-10 NOTE — Progress Notes (Signed)
OFFICE PROGRESS NOTE  CC  Mary [name],TERESA, MD [address] [place] [address] Dr. Chipper Herb Dr. 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 Essentially patient has bilateral breast cancers.  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   #2 patient has begun FEC 100 starting on 12/08/2011 - 01/18/12. A total of 4 cycles of this chemotherapy is planned and then thereafter she will proceed with Taxol as a single agent for total of 12 weeks.  #3 on 02/02/2012 patient will begin singly agent weekly Taxol for a total of 12 weeks.  CURRENT THERAPY Cycle 2 of Taxol  INTERVAL HISTORY: Mary [name] 51 y.o. female returns for follow-up visit today. She's doing well with the Taxol.  She only c/o feeling very groggy after the Benadryl.  Otherwise she is feeling well and without any numbness, tingling, fevers, chills or any other concerns.    MEDICAL HISTORY: Past Medical History  Diagnosis Date  . Irritable bowel syndrome   . GERD (gastroesophageal reflux disease)     Nexium as needed  . Breast cancer August 2013    bilateral  . Dental crowns present     x 4  . Neutropenia, drug-induced 12/15/2011    ALLERGIES:   has no known allergies.  MEDICATIONS:  Current Outpatient Prescriptions  Medication Sig Dispense Refill  . ALPRAZolam (XANAX) 0.25 MG tablet Take 0.25 mg by mouth 3 (three) times daily as needed.       . Ascorbic Acid (VITAMIN C) 100 MG tablet Take 100 mg by mouth daily.        Marland Kitchen b complex vitamins tablet Take 1 tablet by mouth daily.       . calcium carbonate (OS-CAL) 600 MG TABS Take 600 mg by mouth 2 (two) times daily with a meal.        . cholecalciferol (VITAMIN D) 400 UNITS TABS Take by mouth.        . ciprofloxacin (CIPRO) 500 MG tablet Take 1 tablet (500 mg total) by mouth 2 (two) times daily.  14 tablet  6  . dexamethasone (DECADRON) 4 MG tablet Take 2 tablets (8 mg total) by mouth 2 (two) times daily with a meal. Take daily starting the day after chemotherapy for 2 days. Take with food.  30 tablet  1  . lidocaine-prilocaine (EMLA) cream Apply topically as needed.  30 g  6  . loperamide (IMODIUM A-D) 2 MG tablet Take 2 mg by mouth 4 (four) times daily as needed.      Marland Kitchen LORazepam (ATIVAN) 0.5 MG tablet Take 1 tablet (0.5 mg total) by mouth every 8 (eight) hours.  60 tablet  3  . LORazepam (ATIVAN) 0.5 MG tablet Take 1 tablet (0.5 mg total) by mouth every 6 (six) hours as needed (Nausea or vomiting).  30 tablet  0  . Misc. Devices MISC 1 each by Does not apply route daily.  1 each  0  . Multiple Vitamin (MULTIVITAMIN) tablet Take 1 tablet by mouth daily.        Marland Kitchen NEXIUM 40 MG capsule TAKE 1 CAPSULE BY MOUTH DAILY.  30 capsule  6  . ondansetron (ZOFRAN) 8 MG tablet Take 1 tablet (8 mg total) by mouth 2 (two) times daily.  Take two times a day starting the day after chemo for 2 days. Then take two times a day as needed for nausea or vomiting.  30 tablet  1  . prochlorperazine (COMPAZINE) 10 MG tablet Take 1 tablet (10 mg total) by mouth every 6 (six) hours as needed (Nausea or vomiting).  30 tablet  1  . prochlorperazine (COMPAZINE) 25 MG suppository Place 1 suppository (25 mg total) rectally every 12 (twelve) hours as needed for nausea.  12 suppository  3  . vitamin B-12 (CYANOCOBALAMIN) 100 MCG tablet Take 50 mcg by mouth daily.          SURGICAL HISTORY:  Past Surgical History  Procedure Date  . Breast lumpectomy 2008    left  . Portacath placement 12/02/2011    Procedure: INSERTION PORT-A-CATH;  Surgeon: Ernestene Mention, MD;  Location: [facility];  Service: General;  Laterality: N/A;  insertion port a cath with fluoroscopy    REVIEW OF SYSTEMS:   General: fatigue (+),  night sweats (-), fever (-), pain (-) Lymph: palpable nodes (-) HEENT: vision changes (-), mucositis (-), gum bleeding (-), epistaxis (-) Cardiovascular: chest pain (-), palpitations (-) Pulmonary: shortness of breath (-), dyspnea on exertion (-), cough (-), hemoptysis (-) GI:  Early satiety (-), melena (-), dysphagia (-), nausea/vomiting (-), diarrhea (-) GU: dysuria (-), hematuria (-), incontinence (-) Musculoskeletal: joint swelling (-), joint pain (-), back pain (-) Neuro: weakness (-), numbness (-), headache (-), confusion (-) Skin: Rash (-), lesions (-), dryness (-) Psych: depression (-), suicidal/homicidal ideation (-), feeling of hopelessness (-)   PHYSICAL EXAMINATION:  BP 117/79  Pulse 83  Temp 98.6 F (37 C) (Oral)  Resp 20  Ht 5\' 4"  (1.626 m)  Wt 159 lb 8 oz (72.349 kg)  BMI 27.38 kg/m2 General: Patient is a well appearing female in no acute distress HEENT: PERRLA, sclerae anicteric no conjunctival pallor, MMM Neck: supple, no palpable adenopathy Lungs: clear to auscultation bilaterally, no wheezes, rhonchi, or rales Cardiovascular: regular rate rhythm, S1, S2, no murmurs, rubs or gallops Abdomen: Soft, non-tender, non-distended, normoactive bowel sounds, no HSM Extremities: warm and well perfused, no clubbing, cyanosis, or edema Skin: No lesions.  Pt has a maculopapular rash on chest, back, and bilateral upper extremities. Neuro: Non-focal Breast: right breast with retracted nipple, and palpable mass, left breast, no masses or abnormalities, the breast is much softer and the mass is now mobile ECOG PERFORMANCE STATUS: 0 - Asymptomatic   LABORATORY DATA: Lab Results  Component Value Date   WBC 6.4 02/10/2012   HGB 9.6* 02/10/2012   HCT 29.8* 02/10/2012   MCV 98.3 02/10/2012   PLT 409* 02/10/2012  ANC: 1000    Chemistry      Component Value Date/Time   NA 139 02/02/2012 1032   NA 140 05/20/2011 1204   K 4.0 02/02/2012 1032   K 5.2 05/20/2011 1204   CL 108*  02/02/2012 1032   CL 108 05/20/2011 1204   CO2 24 02/02/2012 1032   CO2 22 05/20/2011 1204   BUN 14.0 02/02/2012 1032   BUN 10 05/20/2011 1204   CREATININE 0.8 02/02/2012 1032   CREATININE 0.82 05/20/2011 1204   CREATININE 0.9 01/18/2011 1202      Component Value Date/Time   CALCIUM 9.4 02/02/2012 1032   CALCIUM 8.6 05/20/2011 1204   ALKPHOS 132 02/02/2012 1032   ALKPHOS 80 05/20/2011 1204   AST 13 02/02/2012 1032   AST 21 05/20/2011 1204   ALT  16 02/02/2012 1032   ALT 29 05/20/2011 1204   BILITOT 0.24 02/02/2012 1032   BILITOT 0.3 05/20/2011 1204       RADIOGRAPHIC STUDIES:  ASSESSMENT: 51 year old female with  #1 T3, N1, 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 Patient is now going to receive neoadjuvant chemotherapy consisting of FEC 100. She will receive dose dense FEC with day 2 Neulasta. She is overall doing well she understands the rationale for neoadjuvant treatment she understands the side effects of her chemotherapy. She also has her antiemetics already at her disposal to take as directed.  #2 patient has now completed 4 cycles of neoadjuvant FEC 100.  #3 she will begin now again neoadjuvant single agent Taxol weekly beginning 02/02/2012.    PLAN:  #1 patient will proceed with cycle 2 of Taxol today.  So far she's tolerating the treatment well.    #2 Her rash has resolved.    #3 We will see Ms. Sestak back next Friday for her third cycle of Taxol.     All questions were answered. The patient knows to call the clinic with any problems, questions or concerns. We can certainly see the patient much sooner if necessary.  I spent 15 minutes counseling the patient face to face. The total time spent in the appointment was 30 minutes.  This case was reviewed with Dr. Welton Flakes.   Cherie Ouch Lyn Hollingshead, NP Medical Oncology [facility] Phone: [phone]  02/10/2012, 9:01 AM

## 2012-02-14 ENCOUNTER — Ambulatory Visit: Payer: 59 | Admitting: Adult Health

## 2012-02-14 ENCOUNTER — Other Ambulatory Visit: Payer: 59 | Admitting: Lab

## 2012-02-14 ENCOUNTER — Ambulatory Visit: Payer: 59

## 2012-02-15 ENCOUNTER — Ambulatory Visit: Payer: 59

## 2012-02-17 ENCOUNTER — Telehealth: Payer: Self-pay | Admitting: *Deleted

## 2012-02-17 ENCOUNTER — Other Ambulatory Visit (HOSPITAL_BASED_OUTPATIENT_CLINIC_OR_DEPARTMENT_OTHER): Payer: 59 | Admitting: Lab

## 2012-02-17 ENCOUNTER — Ambulatory Visit: Payer: 59 | Admitting: Oncology

## 2012-02-17 ENCOUNTER — Ambulatory Visit (HOSPITAL_BASED_OUTPATIENT_CLINIC_OR_DEPARTMENT_OTHER): Payer: 59

## 2012-02-17 ENCOUNTER — Encounter: Payer: Self-pay | Admitting: Oncology

## 2012-02-17 ENCOUNTER — Ambulatory Visit (HOSPITAL_BASED_OUTPATIENT_CLINIC_OR_DEPARTMENT_OTHER): Payer: 59 | Admitting: Oncology

## 2012-02-17 VITALS — BP 124/81 | HR 76 | Temp 98.2°F | Resp 20 | Ht 64.0 in | Wt 160.5 lb

## 2012-02-17 DIAGNOSIS — D0590 Unspecified type of carcinoma in situ of unspecified breast: Secondary | ICD-10-CM

## 2012-02-17 DIAGNOSIS — Z5111 Encounter for antineoplastic chemotherapy: Secondary | ICD-10-CM

## 2012-02-17 DIAGNOSIS — C50419 Malignant neoplasm of upper-outer quadrant of unspecified female breast: Secondary | ICD-10-CM

## 2012-02-17 LAB — COMPREHENSIVE METABOLIC PANEL (CC13)
ALT: 31 U/L (ref 0–55)
Alkaline Phosphatase: 88 U/L (ref 40–150)
CO2: 23 mEq/L (ref 22–29)
Creatinine: 0.8 mg/dL (ref 0.6–1.1)
Total Bilirubin: 0.3 mg/dL (ref 0.20–1.20)

## 2012-02-17 LAB — CBC WITH DIFFERENTIAL/PLATELET
EOS%: 1.8 % (ref 0.0–7.0)
Eosinophils Absolute: 0.1 10*3/uL (ref 0.0–0.5)
LYMPH%: 13 % — ABNORMAL LOW (ref 14.0–49.7)
MCH: 32.7 pg (ref 25.1–34.0)
MCHC: 32.6 g/dL (ref 31.5–36.0)
MCV: 100.3 fL (ref 79.5–101.0)
MONO%: 9 % (ref 0.0–14.0)
NEUT#: 5 10*3/uL (ref 1.5–6.5)
Platelets: 256 10*3/uL (ref 145–400)
RBC: 3.09 10*6/uL — ABNORMAL LOW (ref 3.70–5.45)
nRBC: 0 % (ref 0–0)

## 2012-02-17 MED ORDER — PACLITAXEL CHEMO INJECTION 300 MG/50ML
80.0000 mg/m2 | Freq: Once | INTRAVENOUS | Status: AC
  Administered 2012-02-17: 144 mg via INTRAVENOUS
  Filled 2012-02-17: qty 24

## 2012-02-17 MED ORDER — HEPARIN SOD (PORK) LOCK FLUSH 100 UNIT/ML IV SOLN
500.0000 [IU] | Freq: Once | INTRAVENOUS | Status: AC | PRN
  Administered 2012-02-17: 500 [IU]
  Filled 2012-02-17: qty 5

## 2012-02-17 MED ORDER — SODIUM CHLORIDE 0.9 % IJ SOLN
10.0000 mL | INTRAMUSCULAR | Status: DC | PRN
  Administered 2012-02-17: 10 mL
  Filled 2012-02-17: qty 10

## 2012-02-17 MED ORDER — FAMOTIDINE IN NACL 20-0.9 MG/50ML-% IV SOLN
20.0000 mg | Freq: Once | INTRAVENOUS | Status: AC
  Administered 2012-02-17: 20 mg via INTRAVENOUS

## 2012-02-17 MED ORDER — ONDANSETRON 8 MG/50ML IVPB (CHCC)
8.0000 mg | Freq: Once | INTRAVENOUS | Status: AC
  Administered 2012-02-17: 8 mg via INTRAVENOUS

## 2012-02-17 MED ORDER — DIPHENHYDRAMINE HCL 50 MG/ML IJ SOLN
50.0000 mg | Freq: Once | INTRAMUSCULAR | Status: AC
  Administered 2012-02-17: 50 mg via INTRAVENOUS

## 2012-02-17 MED ORDER — SODIUM CHLORIDE 0.9 % IV SOLN
Freq: Once | INTRAVENOUS | Status: AC
  Administered 2012-02-17: 12:00:00 via INTRAVENOUS

## 2012-02-17 MED ORDER — DEXAMETHASONE SODIUM PHOSPHATE 4 MG/ML IJ SOLN
20.0000 mg | Freq: Once | INTRAMUSCULAR | Status: AC
  Administered 2012-02-17: 20 mg via INTRAVENOUS

## 2012-02-17 NOTE — Telephone Encounter (Signed)
As of 02-17-2012 there is nothing that needs to be scheduled for the patient

## 2012-02-17 NOTE — Patient Instructions (Signed)
[facility] Discharge Instructions for Patients Receiving Chemotherapy  Today you received the following chemotherapy agents TAXOL To help prevent nausea and vomiting after your treatment, we encourage you to take your nausea medication   Take it as often as prescribed.   If you develop nausea and vomiting that is not controlled by your nausea medication, call the clinic. If it is after clinic hours your family physician or the after hours number for the clinic or go to the Emergency Department.   BELOW ARE SYMPTOMS THAT SHOULD BE REPORTED IMMEDIATELY:  *FEVER GREATER THAN 100.5 F  *CHILLS WITH OR WITHOUT FEVER  NAUSEA AND VOMITING THAT IS NOT CONTROLLED WITH YOUR NAUSEA MEDICATION  *UNUSUAL SHORTNESS OF BREATH  *UNUSUAL BRUISING OR BLEEDING  TENDERNESS IN MOUTH AND THROAT WITH OR WITHOUT PRESENCE OF ULCERS  *URINARY PROBLEMS  *BOWEL PROBLEMS  UNUSUAL RASH Items with * indicate a potential emergency and should be followed up as soon as possible.  If this is your first treatment one of the nurses will contact you 24 hours after your treatment. Please let the nurse know about any problems that you may have experienced. Feel free to call the clinic you have any questions or concerns. The clinic phone number is [phone].   I have been informed and understand all the instructions given to me. I know to contact the clinic, my physician, or go to the Emergency Department if any problems should occur. I do not have any questions at this time, but understand that I may call the clinic during office hours   should I have any questions or need assistance in obtaining follow up care.    __________________________________________  _____________  __________ Signature of Patient or Authorized Representative            Date                   Time    __________________________________________ Nurse's Signature  Paclitaxel injection What is this  medicine? PACLITAXEL (PAK li TAX el) is a chemotherapy drug. It targets fast dividing cells, like cancer cells, and causes these cells to die. This medicine is used to treat ovarian cancer, breast cancer, and other cancers. This medicine may be used for other purposes; ask your health care provider or pharmacist if you have questions. What should I tell my health care provider before I take this medicine? They need to know if you have any of these conditions: -blood disorders -irregular heartbeat -infection (especially a virus infection such as chickenpox, cold sores, or herpes) -liver disease -previous or ongoing radiation therapy -an unusual or allergic reaction to paclitaxel, alcohol, polyoxyethylated castor oil, other chemotherapy agents, other medicines, foods, dyes, or preservatives -pregnant or trying to get pregnant -breast-feeding How should I use this medicine? This drug is given as an infusion into a vein. It is administered in a hospital or clinic by a specially trained health care professional. Talk to your pediatrician regarding the use of this medicine in children. Special care may be needed. Overdosage: If you think you have taken too much of this medicine contact a poison control center or emergency room at once. NOTE: This medicine is only for you. Do not share this medicine with others. What if I miss a dose? It is important not to miss your dose. Call your doctor or health care professional if you are unable to keep an appointment. What may interact with this medicine? Do not take this medicine with any  of the following medications: -disulfiram -metronidazole This medicine may also interact with the following medications: -cyclosporine -dexamethasone -diazepam -ketoconazole -medicines to increase blood counts like filgrastim, pegfilgrastim, sargramostim -other chemotherapy drugs like cisplatin, doxorubicin, epirubicin, etoposide, teniposide,  vincristine -quinidine -testosterone -vaccines -verapamil Talk to your doctor or health care professional before taking any of these medicines: -acetaminophen -aspirin -ibuprofen -ketoprofen -naproxen This list may not describe all possible interactions. Give your health care provider a list of all the medicines, herbs, non-prescription drugs, or dietary supplements you use. Also tell them if you smoke, drink alcohol, or use illegal drugs. Some items may interact with your medicine. What should I watch for while using this medicine? Your condition will be monitored carefully while you are receiving this medicine. You will need important blood work done while you are taking this medicine. This drug may make you feel generally unwell. This is not uncommon, as chemotherapy can affect healthy cells as well as cancer cells. Report any side effects. Continue your course of treatment even though you feel ill unless your doctor tells you to stop. In some cases, you may be given additional medicines to help with side effects. Follow all directions for their use. Call your doctor or health care professional for advice if you get a fever, chills or sore throat, or other symptoms of a cold or flu. Do not treat yourself. This drug decreases your body's ability to fight infections. Try to avoid being around people who are sick. This medicine may increase your risk to bruise or bleed. Call your doctor or health care professional if you notice any unusual bleeding. Be careful brushing and flossing your teeth or using a toothpick because you may get an infection or bleed more easily. If you have any dental work done, tell your dentist you are receiving this medicine. Avoid taking products that contain aspirin, acetaminophen, ibuprofen, naproxen, or ketoprofen unless instructed by your doctor. These medicines may hide a fever. Do not become pregnant while taking this medicine. Women should inform their doctor if  they wish to become pregnant or think they might be pregnant. There is a potential for serious side effects to an unborn child. Talk to your health care professional or pharmacist for more information. Do not breast-feed an infant while taking this medicine. Men are advised not to father a child while receiving this medicine. What side effects may I notice from receiving this medicine? Side effects that you should report to your doctor or health care professional as soon as possible: -allergic reactions like skin rash, itching or hives, swelling of the face, lips, or tongue -low blood counts - This drug may decrease the number of white blood cells, red blood cells and platelets. You may be at increased risk for infections and bleeding. -signs of infection - fever or chills, cough, sore throat, pain or difficulty passing urine -signs of decreased platelets or bleeding - bruising, pinpoint red spots on the skin, black, tarry stools, nosebleeds -signs of decreased red blood cells - unusually weak or tired, fainting spells, lightheadedness -breathing problems -chest pain -high or low blood pressure -mouth sores -nausea and vomiting -pain, swelling, redness or irritation at the injection site -pain, tingling, numbness in the hands or feet -slow or irregular heartbeat -swelling of the ankle, feet, hands Side effects that usually do not require medical attention (report to your doctor or health care professional if they continue or are bothersome): -bone pain -complete hair loss including hair on your head, underarms, pubic hair,  eyebrows, and eyelashes -changes in the color of fingernails -diarrhea -loosening of the fingernails -loss of appetite -muscle or joint pain -red flush to skin -sweating This list may not describe all possible side effects. Call your doctor for medical advice about side effects. You may report side effects to FDA at 1-800-FDA-1088. Where should I keep my  medicine? This drug is given in a hospital or clinic and will not be stored at home. NOTE: This sheet is a summary. It may not cover all possible information. If you have questions about this medicine, talk to your doctor, pharmacist, or health care provider.  2012, [employer]. (02/18/2008 11:54:26 AM)

## 2012-02-17 NOTE — Progress Notes (Signed)
OFFICE PROGRESS NOTE  CC  TULLO,TERESA, MD [address] [place] [address] Dr. Chipper Herb Dr. 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 Essentially patient has bilateral breast cancers.  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   #2 patient has begun FEC 100 starting on 12/08/2011 - 01/18/12. A total of 4 cycles of this chemotherapy is planned and then thereafter she will proceed with Taxol as a single agent for total of 12 weeks.  #3 on 02/02/2012 patient will begin singly agent weekly Taxol for a total of 12 weeks.  CURRENT THERAPY Cycle 3 of Taxol  INTERVAL HISTORY: Mary [name] 51 y.o. female returns for follow-up visit today. She's doing well with the Taxol.  She only c/o feeling very groggy after the Benadryl.  Otherwise she is feeling well and without any numbness, tingling, fevers, chills or any other concerns.    MEDICAL HISTORY: Past Medical History  Diagnosis Date  . Irritable bowel syndrome   . GERD (gastroesophageal reflux disease)     Nexium as needed  . Breast cancer August 2013    bilateral  . Dental crowns present     x 4  . Neutropenia, drug-induced 12/15/2011    ALLERGIES:   has no known allergies.  MEDICATIONS:  Current Outpatient Prescriptions  Medication Sig Dispense Refill  . ALPRAZolam (XANAX) 0.25 MG tablet Take 0.25 mg by mouth 3 (three) times daily as needed.       . Ascorbic Acid (VITAMIN C) 100 MG tablet Take 100 mg by mouth daily.        Marland Kitchen b complex vitamins tablet Take 1 tablet by mouth daily.       . calcium carbonate (OS-CAL) 600 MG TABS Take 600 mg by mouth 2 (two) times daily with a meal.        . cholecalciferol (VITAMIN D) 400 UNITS TABS Take by mouth.        . lidocaine-prilocaine (EMLA) cream Apply topically as needed.  30 g  6  . LORazepam (ATIVAN) 0.5 MG tablet Take 1 tablet (0.5 mg total) by mouth every 8 (eight) hours.  60 tablet  3  . Multiple Vitamin (MULTIVITAMIN) tablet Take 1 tablet by mouth daily.        Marland Kitchen NEXIUM 40 MG capsule TAKE 1 CAPSULE BY MOUTH DAILY.  30 capsule  6  . vitamin B-12 (CYANOCOBALAMIN) 100 MCG tablet Take 50 mcg by mouth daily.        . ciprofloxacin (CIPRO) 500 MG tablet Take 1 tablet (500 mg total) by mouth 2 (two) times daily.  14 tablet  6  . dexamethasone (DECADRON) 4 MG tablet Take 2 tablets (8 mg total) by mouth 2 (two) times daily with a meal. Take daily starting the day after chemotherapy for 2 days. Take with food.  30 tablet  1  . loperamide (IMODIUM A-D) 2 MG tablet Take 2 mg by mouth 4 (four) times daily as needed.      . Misc. Devices MISC 1 each by Does not apply route daily.  1 each  0  . ondansetron (ZOFRAN) 8 MG tablet Take 1 tablet (8 mg total) by mouth 2 (two) times daily. Take two times a day starting the day after  chemo for 2 days. Then take two times a day as needed for nausea or vomiting.  30 tablet  1  . prochlorperazine (COMPAZINE) 10 MG tablet Take 1 tablet (10 mg total) by mouth every 6 (six) hours as needed (Nausea or vomiting).  30 tablet  1  . prochlorperazine (COMPAZINE) 25 MG suppository Place 1 suppository (25 mg total) rectally every 12 (twelve) hours as needed for nausea.  12 suppository  3    SURGICAL HISTORY:  Past Surgical History  Procedure Date  . Breast lumpectomy 2008    left  . Portacath placement 12/02/2011    Procedure: INSERTION PORT-A-CATH;  Surgeon: Ernestene Mention, MD;  Location: [facility];  Service: General;  Laterality: N/A;  insertion port a cath with fluoroscopy    REVIEW OF SYSTEMS:   General: fatigue (+), night sweats (-), fever (-), pain (-) Lymph: palpable nodes (-) HEENT: vision changes (-), mucositis (-), gum bleeding (-), epistaxis  (-) Cardiovascular: chest pain (-), palpitations (-) Pulmonary: shortness of breath (-), dyspnea on exertion (-), cough (-), hemoptysis (-) GI:  Early satiety (-), melena (-), dysphagia (-), nausea/vomiting (-), diarrhea (-) GU: dysuria (-), hematuria (-), incontinence (-) Musculoskeletal: joint swelling (-), joint pain (-), back pain (-) Neuro: weakness (-), numbness (-), headache (-), confusion (-) Skin: Rash (-), lesions (-), dryness (-) Psych: depression (-), suicidal/homicidal ideation (-), feeling of hopelessness (-)   PHYSICAL EXAMINATION:  BP 124/81  Pulse 76  Temp 98.2 F (36.8 C) (Oral)  Resp 20  Ht 5\' 4"  (1.626 m)  Wt 160 lb 8 oz (72.802 kg)  BMI 27.55 kg/m2 General: Patient is a well appearing female in no acute distress HEENT: PERRLA, sclerae anicteric no conjunctival pallor, MMM Neck: supple, no palpable adenopathy Lungs: clear to auscultation bilaterally, no wheezes, rhonchi, or rales Cardiovascular: regular rate rhythm, S1, S2, no murmurs, rubs or gallops Abdomen: Soft, non-tender, non-distended, normoactive bowel sounds, no HSM Extremities: warm and well perfused, no clubbing, cyanosis, or edema Skin: No lesions.  Pt has a maculopapular rash on chest, back, and bilateral upper extremities. Neuro: Non-focal Breast: right breast with retracted nipple, and palpable mass, left breast, no masses or abnormalities, the breast is much softer and the mass is now mobile ECOG PERFORMANCE STATUS: 0 - Asymptomatic   LABORATORY DATA: Lab Results  Component Value Date   WBC 6.7 02/17/2012   HGB 10.1* 02/17/2012   HCT 31.0* 02/17/2012   MCV 100.3 02/17/2012   PLT 256 02/17/2012  ANC: 1000    Chemistry      Component Value Date/Time   NA 140 02/10/2012 0807   NA 140 05/20/2011 1204   K 4.2 02/10/2012 0807   K 5.2 05/20/2011 1204   CL 108* 02/10/2012 0807   CL 108 05/20/2011 1204   CO2 23 02/10/2012 0807   CO2 22 05/20/2011 1204   BUN 12.0 02/10/2012 0807   BUN 10  05/20/2011 1204   CREATININE 0.7 02/10/2012 0807   CREATININE 0.82 05/20/2011 1204   CREATININE 0.9 01/18/2011 1202      Component Value Date/Time   CALCIUM 9.3 02/10/2012 0807   CALCIUM 8.6 05/20/2011 1204   ALKPHOS 90 02/10/2012 0807   ALKPHOS 80 05/20/2011 1204   AST 19 02/10/2012 0807   AST 21 05/20/2011 1204   ALT 30 02/10/2012 0807   ALT 29 05/20/2011 1204   BILITOT 0.25 02/10/2012 0807   BILITOT 0.3 05/20/2011 1204       RADIOGRAPHIC STUDIES:  ASSESSMENT: 51 year old female with  #1 T3, N1, 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 Patient is now going to receive neoadjuvant chemotherapy consisting of FEC 100. She will receive dose dense FEC with day 2 Neulasta. She is overall doing well she understands the rationale for neoadjuvant treatment she understands the side effects of her chemotherapy. She also has her antiemetics already at her disposal to take as directed.  #2 patient has now completed 4 cycles of neoadjuvant FEC 100.  #3 she will begin now again neoadjuvant single agent Taxol weekly beginning 02/02/2012.    PLAN:  #1 patient will proceed with cycle 3 of Taxol today.  So far she's tolerating the treatment well.    #2 Her rash has resolved.    #3 We will see Ms. Omlor back next Friday for her fourth cycle of Taxol.     All questions were answered. The patient knows to call the clinic with any problems, questions or concerns. We can certainly see the patient much sooner if necessary.  I spent 25 minutes counseling the patient face to face. The total time spent in the appointment was 30 minutes.

## 2012-02-22 ENCOUNTER — Ambulatory Visit: Payer: 59

## 2012-02-22 ENCOUNTER — Other Ambulatory Visit: Payer: 59 | Admitting: Lab

## 2012-02-22 ENCOUNTER — Ambulatory Visit: Payer: 59 | Admitting: Oncology

## 2012-02-24 ENCOUNTER — Ambulatory Visit (HOSPITAL_BASED_OUTPATIENT_CLINIC_OR_DEPARTMENT_OTHER): Payer: 59

## 2012-02-24 ENCOUNTER — Ambulatory Visit (HOSPITAL_BASED_OUTPATIENT_CLINIC_OR_DEPARTMENT_OTHER): Payer: 59 | Admitting: Adult Health

## 2012-02-24 ENCOUNTER — Encounter: Payer: Self-pay | Admitting: Adult Health

## 2012-02-24 ENCOUNTER — Other Ambulatory Visit (HOSPITAL_BASED_OUTPATIENT_CLINIC_OR_DEPARTMENT_OTHER): Payer: 59 | Admitting: Lab

## 2012-02-24 VITALS — BP 144/85 | HR 87 | Temp 98.6°F | Resp 20 | Ht 64.0 in | Wt 160.5 lb

## 2012-02-24 DIAGNOSIS — C50419 Malignant neoplasm of upper-outer quadrant of unspecified female breast: Secondary | ICD-10-CM

## 2012-02-24 DIAGNOSIS — Z5111 Encounter for antineoplastic chemotherapy: Secondary | ICD-10-CM

## 2012-02-24 DIAGNOSIS — D0590 Unspecified type of carcinoma in situ of unspecified breast: Secondary | ICD-10-CM

## 2012-02-24 LAB — CBC WITH DIFFERENTIAL/PLATELET
Eosinophils Absolute: 0.3 10*3/uL (ref 0.0–0.5)
HCT: 30.6 % — ABNORMAL LOW (ref 34.8–46.6)
HGB: 10 g/dL — ABNORMAL LOW (ref 11.6–15.9)
MCH: 33.1 pg (ref 25.1–34.0)
MCHC: 32.7 g/dL (ref 31.5–36.0)
MCV: 101.3 fL — ABNORMAL HIGH (ref 79.5–101.0)
MONO#: 0.4 10*3/uL (ref 0.1–0.9)
Platelets: 271 10*3/uL (ref 145–400)
lymph#: 1 10*3/uL (ref 0.9–3.3)

## 2012-02-24 LAB — COMPREHENSIVE METABOLIC PANEL (CC13)
Albumin: 3.6 g/dL (ref 3.5–5.0)
Alkaline Phosphatase: 95 U/L (ref 40–150)
CO2: 24 mEq/L (ref 22–29)
Glucose: 106 mg/dl — ABNORMAL HIGH (ref 70–99)
Potassium: 3.7 mEq/L (ref 3.5–5.1)
Sodium: 142 mEq/L (ref 136–145)
Total Protein: 6.4 g/dL (ref 6.4–8.3)

## 2012-02-24 MED ORDER — PACLITAXEL CHEMO INJECTION 300 MG/50ML
80.0000 mg/m2 | Freq: Once | INTRAVENOUS | Status: AC
  Administered 2012-02-24: 144 mg via INTRAVENOUS
  Filled 2012-02-24: qty 24

## 2012-02-24 MED ORDER — DIPHENHYDRAMINE HCL 50 MG/ML IJ SOLN
50.0000 mg | Freq: Once | INTRAMUSCULAR | Status: AC
  Administered 2012-02-24: 50 mg via INTRAVENOUS

## 2012-02-24 MED ORDER — SODIUM CHLORIDE 0.9 % IJ SOLN
10.0000 mL | INTRAMUSCULAR | Status: DC | PRN
  Administered 2012-02-24: 10 mL
  Filled 2012-02-24: qty 10

## 2012-02-24 MED ORDER — DEXAMETHASONE SODIUM PHOSPHATE 4 MG/ML IJ SOLN
20.0000 mg | Freq: Once | INTRAMUSCULAR | Status: AC
  Administered 2012-02-24: 20 mg via INTRAVENOUS

## 2012-02-24 MED ORDER — SODIUM CHLORIDE 0.9 % IV SOLN
Freq: Once | INTRAVENOUS | Status: AC
  Administered 2012-02-24: 16:00:00 via INTRAVENOUS

## 2012-02-24 MED ORDER — HEPARIN SOD (PORK) LOCK FLUSH 100 UNIT/ML IV SOLN
500.0000 [IU] | Freq: Once | INTRAVENOUS | Status: AC | PRN
  Administered 2012-02-24: 500 [IU]
  Filled 2012-02-24: qty 5

## 2012-02-24 MED ORDER — ONDANSETRON 8 MG/50ML IVPB (CHCC)
8.0000 mg | Freq: Once | INTRAVENOUS | Status: AC
  Administered 2012-02-24: 8 mg via INTRAVENOUS

## 2012-02-24 MED ORDER — FAMOTIDINE IN NACL 20-0.9 MG/50ML-% IV SOLN
20.0000 mg | Freq: Once | INTRAVENOUS | Status: AC
  Administered 2012-02-24: 20 mg via INTRAVENOUS

## 2012-02-24 NOTE — Patient Instructions (Addendum)
Doing well.  Proceed with chemotherapy.  Please call us if you have any questions or concerns.   We will see you back next week.

## 2012-02-24 NOTE — Progress Notes (Signed)
OFFICE PROGRESS NOTE  CC  TULLO,TERESA, MD [address] [place] [address] Dr. Chipper Herb Dr. 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 Essentially patient has bilateral breast cancers.  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   #2 patient has begun FEC 100 starting on 12/08/2011 - 01/18/12. A total of 4 cycles of this chemotherapy is planned and then thereafter she will proceed with Taxol as a single agent for total of 12 weeks.  #3 on 02/02/2012 patient will begin singly agent weekly Taxol for a total of 12 weeks.  CURRENT THERAPY Cycle 4 of Taxol  INTERVAL HISTORY: Mary [name] 51 y.o. female returns for follow-up visit today. She's doing well with the Taxol.  She's feeling well with no numbness, nausea, vomiting, or any other questions or concerns.    MEDICAL HISTORY: Past Medical History  Diagnosis Date  . Irritable bowel syndrome   . GERD (gastroesophageal reflux disease)     Nexium as needed  . Breast cancer August 2013    bilateral  . Dental crowns present     x 4  . Neutropenia, drug-induced 12/15/2011    ALLERGIES:   has no known allergies.  MEDICATIONS:  Current Outpatient Prescriptions  Medication Sig Dispense Refill  . ALPRAZolam (XANAX) 0.25 MG tablet Take 0.25 mg by mouth 3 (three) times daily as needed.       . Ascorbic Acid (VITAMIN C) 100 MG tablet Take 100 mg by mouth daily.        Marland Kitchen b complex vitamins tablet Take 1 tablet by mouth daily.       . calcium carbonate (OS-CAL) 600 MG TABS Take 600 mg by mouth 2 (two) times daily with a meal.        . cholecalciferol (VITAMIN D) 400 UNITS TABS Take by mouth.      . ciprofloxacin (CIPRO) 500 MG tablet Take 1 tablet (500 mg  total) by mouth 2 (two) times daily.  14 tablet  6  . dexamethasone (DECADRON) 4 MG tablet Take 2 tablets (8 mg total) by mouth 2 (two) times daily with a meal. Take daily starting the day after chemotherapy for 2 days. Take with food.  30 tablet  1  . lidocaine-prilocaine (EMLA) cream Apply topically as needed.  30 g  6  . loperamide (IMODIUM A-D) 2 MG tablet Take 2 mg by mouth 4 (four) times daily as needed.      Marland Kitchen LORazepam (ATIVAN) 0.5 MG tablet Take 1 tablet (0.5 mg total) by mouth every 8 (eight) hours.  60 tablet  3  . Misc. Devices MISC 1 each by Does not apply route daily.  1 each  0  . Multiple Vitamin (MULTIVITAMIN) tablet Take 1 tablet by mouth daily.        Marland Kitchen NEXIUM 40 MG capsule TAKE 1 CAPSULE BY MOUTH DAILY.  30 capsule  6  . ondansetron (ZOFRAN) 8 MG tablet Take 1 tablet (8 mg total) by mouth 2 (two) times daily. Take two times a day starting the day after chemo for 2 days. Then take two times a day as needed for nausea or vomiting.  30 tablet  1  . prochlorperazine (COMPAZINE) 10 MG tablet Take 1 tablet (10 mg  total) by mouth every 6 (six) hours as needed (Nausea or vomiting).  30 tablet  1  . prochlorperazine (COMPAZINE) 25 MG suppository Place 1 suppository (25 mg total) rectally every 12 (twelve) hours as needed for nausea.  12 suppository  3  . vitamin B-12 (CYANOCOBALAMIN) 100 MCG tablet Take 50 mcg by mouth daily.          SURGICAL HISTORY:  Past Surgical History  Procedure Date  . Breast lumpectomy 2008    left  . Portacath placement 12/02/2011    Procedure: INSERTION PORT-A-CATH;  Surgeon: Ernestene Mention, MD;  Location: [facility];  Service: General;  Laterality: N/A;  insertion port a cath with fluoroscopy    REVIEW OF SYSTEMS:   General: fatigue (+), night sweats (-), fever (-), pain (-) Lymph: palpable nodes (-) HEENT: vision changes (-), mucositis (-), gum bleeding (-), epistaxis (-) Cardiovascular: chest pain (-), palpitations (-) Pulmonary:  shortness of breath (-), dyspnea on exertion (-), cough (-), hemoptysis (-) GI:  Early satiety (-), melena (-), dysphagia (-), nausea/vomiting (-), diarrhea (-) GU: dysuria (-), hematuria (-), incontinence (-) Musculoskeletal: joint swelling (-), joint pain (-), back pain (-) Neuro: weakness (-), numbness (-), headache (-), confusion (-) Skin: Rash (-), lesions (-), dryness (-) Psych: depression (-), suicidal/homicidal ideation (-), feeling of hopelessness (-)   PHYSICAL EXAMINATION:  BP 144/85  Pulse 87  Temp 98.6 F (37 C) (Oral)  Resp 20  Ht 5\' 4"  (1.626 m)  Wt 160 lb 8 oz (72.802 kg)  BMI 27.55 kg/m2 General: Patient is a well appearing female in no acute distress HEENT: PERRLA, sclerae anicteric no conjunctival pallor, MMM Neck: supple, no palpable adenopathy Lungs: clear to auscultation bilaterally, no wheezes, rhonchi, or rales Cardiovascular: regular rate rhythm, S1, S2, no murmurs, rubs or gallops Abdomen: Soft, non-tender, non-distended, normoactive bowel sounds, no HSM Extremities: warm and well perfused, no clubbing, cyanosis, or edema Skin: No lesions or rash Neuro: Non-focal Breast: right breast with retracted nipple, and palpable mass, left breast, no masses or abnormalities, the breast is much softer and the mass is now mobile ECOG PERFORMANCE STATUS: 0 - Asymptomatic   LABORATORY DATA: Lab Results  Component Value Date   WBC 6.7 02/24/2012   HGB 10.0* 02/24/2012   HCT 30.6* 02/24/2012   MCV 101.3* 02/24/2012   PLT 271 02/24/2012      Chemistry      Component Value Date/Time   NA 142 02/17/2012 1046   NA 140 05/20/2011 1204   K 4.2 02/17/2012 1046   K 5.2 05/20/2011 1204   CL 109* 02/17/2012 1046   CL 108 05/20/2011 1204   CO2 23 02/17/2012 1046   CO2 22 05/20/2011 1204   BUN 13.0 02/17/2012 1046   BUN 10 05/20/2011 1204   CREATININE 0.8 02/17/2012 1046   CREATININE 0.82 05/20/2011 1204   CREATININE 0.9 01/18/2011 1202      Component Value Date/Time   CALCIUM  9.1 02/17/2012 1046   CALCIUM 8.6 05/20/2011 1204   ALKPHOS 88 02/17/2012 1046   ALKPHOS 80 05/20/2011 1204   AST 17 02/17/2012 1046   AST 21 05/20/2011 1204   ALT 31 02/17/2012 1046   ALT 29 05/20/2011 1204   BILITOT 0.30 02/17/2012 1046   BILITOT 0.3 05/20/2011 1204       RADIOGRAPHIC STUDIES:  ASSESSMENT: 51 year old female with  #1 T3, N1, 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 Patient is  now going to receive neoadjuvant chemotherapy consisting of FEC 100. She will receive dose dense FEC with day 2 Neulasta. She is overall doing well she understands the rationale for neoadjuvant treatment she understands the side effects of her chemotherapy. She also has her antiemetics already at her disposal to take as directed.  #2 patient has now completed 4 cycles of neoadjuvant FEC 100.  #3 she will begin now again neoadjuvant single agent Taxol weekly beginning 02/02/2012.    PLAN:  #1 patient will proceed with cycle 4 of Taxol today.  So far she's tolerating the treatment well.    #2 Her rash has resolved.    #3 We will see Mary [name] back next Friday for her fifth cycle of Taxol.     All questions were answered. The patient knows to call the clinic with any problems, questions or concerns. We can certainly see the patient much sooner if necessary.  I spent 15 minutes counseling the patient face to face. The total time spent in the appointment was 30 minutes.  This case was reviewed by Dr. Welton Flakes.   Cherie Ouch Lyn Hollingshead, NP Medical Oncology [facility] Phone: [phone]

## 2012-02-24 NOTE — Patient Instructions (Addendum)
[facility] Discharge Instructions for Patients Receiving Chemotherapy  Today you received the following chemotherapy agents Taxol  To help prevent nausea and vomiting after your treatment, we encourage you to take your nausea medication as prescribed.   If you develop nausea and vomiting that is not controlled by your nausea medication, call the clinic. If it is after clinic hours your family physician or the after hours number for the clinic or go to the Emergency Department.   BELOW ARE SYMPTOMS THAT SHOULD BE REPORTED IMMEDIATELY:  *FEVER GREATER THAN 100.5 F  *CHILLS WITH OR WITHOUT FEVER  NAUSEA AND VOMITING THAT IS NOT CONTROLLED WITH YOUR NAUSEA MEDICATION  *UNUSUAL SHORTNESS OF BREATH  *UNUSUAL BRUISING OR BLEEDING  TENDERNESS IN MOUTH AND THROAT WITH OR WITHOUT PRESENCE OF ULCERS  *URINARY PROBLEMS  *BOWEL PROBLEMS  UNUSUAL RASH Items with * indicate a potential emergency and should be followed up as soon as possible.  Feel free to call the clinic you have any questions or concerns. The clinic phone number is [phone].   I have been informed and understand all the instructions given to me. I know to contact the clinic, my physician, or go to the Emergency Department if any problems should occur. I do not have any questions at this time, but understand that I may call the clinic during office hours   should I have any questions or need assistance in obtaining follow up care.

## 2012-02-28 ENCOUNTER — Other Ambulatory Visit: Payer: 59 | Admitting: Lab

## 2012-02-28 ENCOUNTER — Ambulatory Visit: Payer: 59 | Admitting: Oncology

## 2012-03-02 ENCOUNTER — Other Ambulatory Visit (HOSPITAL_BASED_OUTPATIENT_CLINIC_OR_DEPARTMENT_OTHER): Payer: 59 | Admitting: Lab

## 2012-03-02 ENCOUNTER — Ambulatory Visit (HOSPITAL_BASED_OUTPATIENT_CLINIC_OR_DEPARTMENT_OTHER): Payer: 59 | Admitting: Adult Health

## 2012-03-02 ENCOUNTER — Ambulatory Visit (HOSPITAL_BASED_OUTPATIENT_CLINIC_OR_DEPARTMENT_OTHER): Payer: 59

## 2012-03-02 ENCOUNTER — Encounter: Payer: Self-pay | Admitting: Adult Health

## 2012-03-02 VITALS — BP 146/79 | HR 81 | Temp 98.4°F | Resp 20 | Ht 64.0 in | Wt 159.5 lb

## 2012-03-02 DIAGNOSIS — C50419 Malignant neoplasm of upper-outer quadrant of unspecified female breast: Secondary | ICD-10-CM

## 2012-03-02 DIAGNOSIS — Z5111 Encounter for antineoplastic chemotherapy: Secondary | ICD-10-CM

## 2012-03-02 DIAGNOSIS — D0590 Unspecified type of carcinoma in situ of unspecified breast: Secondary | ICD-10-CM

## 2012-03-02 LAB — CBC WITH DIFFERENTIAL/PLATELET
BASO%: 1.4 % (ref 0.0–2.0)
EOS%: 2.4 % (ref 0.0–7.0)
HCT: 31.2 % — ABNORMAL LOW (ref 34.8–46.6)
LYMPH%: 12.9 % — ABNORMAL LOW (ref 14.0–49.7)
MCH: 34.4 pg — ABNORMAL HIGH (ref 25.1–34.0)
MCHC: 33.3 g/dL (ref 31.5–36.0)
MONO%: 6.3 % (ref 0.0–14.0)
NEUT%: 77 % — ABNORMAL HIGH (ref 38.4–76.8)
Platelets: 273 10*3/uL (ref 145–400)
RBC: 3.02 10*6/uL — ABNORMAL LOW (ref 3.70–5.45)
WBC: 5.7 10*3/uL (ref 3.9–10.3)
nRBC: 0 % (ref 0–0)

## 2012-03-02 LAB — COMPREHENSIVE METABOLIC PANEL (CC13)
ALT: 29 U/L (ref 0–55)
AST: 20 U/L (ref 5–34)
Alkaline Phosphatase: 96 U/L (ref 40–150)
BUN: 15 mg/dL (ref 7.0–26.0)
Calcium: 9.5 mg/dL (ref 8.4–10.4)
Creatinine: 0.8 mg/dL (ref 0.6–1.1)
Total Bilirubin: 0.32 mg/dL (ref 0.20–1.20)

## 2012-03-02 MED ORDER — DEXAMETHASONE SODIUM PHOSPHATE 4 MG/ML IJ SOLN
20.0000 mg | Freq: Once | INTRAMUSCULAR | Status: AC
  Administered 2012-03-02: 20 mg via INTRAVENOUS

## 2012-03-02 MED ORDER — PACLITAXEL CHEMO INJECTION 300 MG/50ML
80.0000 mg/m2 | Freq: Once | INTRAVENOUS | Status: AC
  Administered 2012-03-02: 144 mg via INTRAVENOUS
  Filled 2012-03-02: qty 24

## 2012-03-02 MED ORDER — SODIUM CHLORIDE 0.9 % IV SOLN
Freq: Once | INTRAVENOUS | Status: AC
  Administered 2012-03-02: 12:00:00 via INTRAVENOUS

## 2012-03-02 MED ORDER — DIPHENHYDRAMINE HCL 50 MG/ML IJ SOLN
50.0000 mg | Freq: Once | INTRAMUSCULAR | Status: AC
  Administered 2012-03-02: 50 mg via INTRAVENOUS

## 2012-03-02 MED ORDER — HEPARIN SOD (PORK) LOCK FLUSH 100 UNIT/ML IV SOLN
500.0000 [IU] | Freq: Once | INTRAVENOUS | Status: AC | PRN
  Administered 2012-03-02: 500 [IU]
  Filled 2012-03-02: qty 5

## 2012-03-02 MED ORDER — SODIUM CHLORIDE 0.9 % IJ SOLN
10.0000 mL | INTRAMUSCULAR | Status: DC | PRN
  Administered 2012-03-02: 10 mL
  Filled 2012-03-02: qty 10

## 2012-03-02 MED ORDER — FAMOTIDINE IN NACL 20-0.9 MG/50ML-% IV SOLN
20.0000 mg | Freq: Once | INTRAVENOUS | Status: AC
  Administered 2012-03-02: 20 mg via INTRAVENOUS

## 2012-03-02 MED ORDER — ONDANSETRON 8 MG/50ML IVPB (CHCC)
8.0000 mg | Freq: Once | INTRAVENOUS | Status: AC
  Administered 2012-03-02: 8 mg via INTRAVENOUS

## 2012-03-02 NOTE — Progress Notes (Signed)
OFFICE PROGRESS NOTE  CC  TULLO,TERESA, MD [address] [place] [address] Dr. Chipper Herb Dr. 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 Essentially patient has bilateral breast cancers.  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   #2 patient has begun FEC 100 starting on 12/08/2011 - 01/18/12. A total of 4 cycles of this chemotherapy is planned and then thereafter she will proceed with Taxol as a single agent for total of 12 weeks.  #3 on 02/02/2012 patient will begin singly agent weekly Taxol for a total of 12 weeks.  CURRENT THERAPY Cycle 5 of Taxol  INTERVAL HISTORY: Mary [name] 51 y.o. female returns for follow-up visit today. She's doing well with the Taxol. Her port has been working well, and she has had no difficulties, particularly with numbness, tingling, fevers, chills or any other concerns.    MEDICAL HISTORY: Past Medical History  Diagnosis Date  . Irritable bowel syndrome   . GERD (gastroesophageal reflux disease)     Nexium as needed  . Breast cancer August 2013    bilateral  . Dental crowns present     x 4  . Neutropenia, drug-induced 12/15/2011    ALLERGIES:   has no known allergies.  MEDICATIONS:  Current Outpatient Prescriptions  Medication Sig Dispense Refill  . ALPRAZolam (XANAX) 0.25 MG tablet Take 0.25 mg by mouth 3 (three) times daily as needed.       . Ascorbic Acid (VITAMIN C) 100 MG tablet Take 100 mg by mouth daily.        Marland Kitchen b complex vitamins tablet Take 1 tablet by mouth daily.       . calcium carbonate (OS-CAL) 600 MG TABS Take 600 mg by mouth 2 (two) times daily with a meal.        . cholecalciferol (VITAMIN D) 400 UNITS TABS Take by mouth.      . ciprofloxacin  (CIPRO) 500 MG tablet Take 1 tablet (500 mg total) by mouth 2 (two) times daily.  14 tablet  6  . dexamethasone (DECADRON) 4 MG tablet Take 2 tablets (8 mg total) by mouth 2 (two) times daily with a meal. Take daily starting the day after chemotherapy for 2 days. Take with food.  30 tablet  1  . lidocaine-prilocaine (EMLA) cream Apply topically as needed.  30 g  6  . loperamide (IMODIUM A-D) 2 MG tablet Take 2 mg by mouth 4 (four) times daily as needed.      Marland Kitchen LORazepam (ATIVAN) 0.5 MG tablet Take 1 tablet (0.5 mg total) by mouth every 8 (eight) hours.  60 tablet  3  . Misc. Devices MISC 1 each by Does not apply route daily.  1 each  0  . Multiple Vitamin (MULTIVITAMIN) tablet Take 1 tablet by mouth daily.        Marland Kitchen NEXIUM 40 MG capsule TAKE 1 CAPSULE BY MOUTH DAILY.  30 capsule  6  . ondansetron (ZOFRAN) 8 MG tablet Take 1 tablet (8 mg total) by mouth 2 (two) times daily. Take two times a day starting the day after chemo for 2 days. Then take two times a day as needed for nausea or vomiting.  30 tablet  1  . prochlorperazine (COMPAZINE) 10  MG tablet Take 1 tablet (10 mg total) by mouth every 6 (six) hours as needed (Nausea or vomiting).  30 tablet  1  . prochlorperazine (COMPAZINE) 25 MG suppository Place 1 suppository (25 mg total) rectally every 12 (twelve) hours as needed for nausea.  12 suppository  3  . vitamin B-12 (CYANOCOBALAMIN) 100 MCG tablet Take 50 mcg by mouth daily.         No current facility-administered medications for this visit.   Facility-Administered Medications Ordered in Other Visits  Medication Dose Route Frequency Provider Last Rate Last Dose  . sodium chloride 0.9 % injection 10 mL  10 mL Intracatheter PRN Victorino December, MD   10 mL at 03/02/12 1406    SURGICAL HISTORY:  Past Surgical History  Procedure Date  . Breast lumpectomy 2008    left  . Portacath placement 12/02/2011    Procedure: INSERTION PORT-A-CATH;  Surgeon: Ernestene Mention, MD;  Location: [facility];  Service: General;  Laterality: N/A;  insertion port a cath with fluoroscopy    REVIEW OF SYSTEMS:   General: fatigue (+), night sweats (-), fever (-), pain (-) Lymph: palpable nodes (-) HEENT: vision changes (-), mucositis (-), gum bleeding (-), epistaxis (-) Cardiovascular: chest pain (-), palpitations (-) Pulmonary: shortness of breath (-), dyspnea on exertion (-), cough (-), hemoptysis (-) GI:  Early satiety (-), melena (-), dysphagia (-), nausea/vomiting (-), diarrhea (-) GU: dysuria (-), hematuria (-), incontinence (-) Musculoskeletal: joint swelling (-), joint pain (-), back pain (-) Neuro: weakness (-), numbness (-), headache (-), confusion (-) Skin: Rash (-), lesions (-), dryness (-) Psych: depression (-), suicidal/homicidal ideation (-), feeling of hopelessness (-)   PHYSICAL EXAMINATION:  BP 146/79  Pulse 81  Temp 98.4 F (36.9 C) (Oral)  Resp 20  Ht 5\' 4"  (1.626 m)  Wt 159 lb 8 oz (72.349 kg)  BMI 27.38 kg/m2 General: Patient is a well appearing female in no acute distress HEENT: PERRLA, sclerae anicteric no conjunctival pallor, MMM Neck: supple, no palpable adenopathy Lungs: clear to auscultation bilaterally, no wheezes, rhonchi, or rales Cardiovascular: regular rate rhythm, S1, S2, no murmurs, rubs or gallops Abdomen: Soft, non-tender, non-distended, normoactive bowel sounds, no HSM Extremities: warm and well perfused, no clubbing, cyanosis, or edema Skin: No lesions or rash Neuro: Non-focal Breast: right breast with retracted nipple, and palpable mass, left breast, no masses or abnormalities, the breast is much softer and the mass is now mobile ECOG PERFORMANCE STATUS: 0 - Asymptomatic   LABORATORY DATA: Lab Results  Component Value Date   WBC 5.7 1Feb 25, 202013   HGB 10.4* 1Feb 25, 202013   HCT 31.2* 1Feb 25, 202013   MCV 103.3* 1Feb 25, 202013   PLT 273 1Feb 25, 202013      Chemistry      Component Value Date/Time   NA 141 1Feb 25, 202013 1045   NA 140  05/20/2011 1204   K 4.0 1Feb 25, 202013 1045   K 5.2 05/20/2011 1204   CL 108* 1Feb 25, 202013 1045   CL 108 05/20/2011 1204   CO2 23 1Feb 25, 202013 1045   CO2 22 05/20/2011 1204   BUN 15.0 1Feb 25, 202013 1045   BUN 10 05/20/2011 1204   CREATININE 0.8 1Feb 25, 202013 1045   CREATININE 0.82 05/20/2011 1204   CREATININE 0.9 01/18/2011 1202      Component Value Date/Time   CALCIUM 9.5 1Feb 25, 202013 1045   CALCIUM 8.6 05/20/2011 1204   ALKPHOS 96 1Feb 25, 202013 1045   ALKPHOS 80 05/20/2011 1204   AST 20 1Feb 25, 202013 1045   AST 21  05/20/2011 1204   ALT 29 105/21/2013 1045   ALT 29 05/20/2011 1204   BILITOT 0.32 105/21/2013 1045   BILITOT 0.3 05/20/2011 1204       RADIOGRAPHIC STUDIES:  ASSESSMENT: 51 year old female with  #1 T3, N1, 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 Patient is now going to receive neoadjuvant chemotherapy consisting of FEC 100. She will receive dose dense FEC with day 2 Neulasta. She is overall doing well she understands the rationale for neoadjuvant treatment she understands the side effects of her chemotherapy. She also has her antiemetics already at her disposal to take as directed.  #2 patient has now completed 4 cycles of neoadjuvant FEC 100.  #3 she will begin now again neoadjuvant single agent Taxol weekly beginning 02/02/2012.   PLAN:  #1 patient will proceed with cycle 5 of Taxol today.  So far she's tolerating the treatment well.    #2 We will see Ms. Wan back next Friday for her sixth cycle of Taxol.     All questions were answered. The patient knows to call the clinic with any problems, questions or concerns. We can certainly see the patient much sooner if necessary.  I spent 15 minutes counseling the patient face to face. The total time spent in the appointment was 30 minutes.  This case was reviewed by Dr. Welton Flakes.   Cherie Ouch Lyn Hollingshead, NP Medical Oncology [facility] Phone: [phone]

## 2012-03-02 NOTE — Patient Instructions (Addendum)
[facility] Discharge Instructions for Patients Receiving Chemotherapy  Today you received the following chemotherapy agents Taxol.  To help prevent nausea and vomiting after your treatment, we encourage you to take your nausea medication as prescribed.   If you develop nausea and vomiting that is not controlled by your nausea medication, call the clinic. If it is after clinic hours your family physician or the after hours number for the clinic or go to the Emergency Department.   BELOW ARE SYMPTOMS THAT SHOULD BE REPORTED IMMEDIATELY:  *FEVER GREATER THAN 100.5 F  *CHILLS WITH OR WITHOUT FEVER  NAUSEA AND VOMITING THAT IS NOT CONTROLLED WITH YOUR NAUSEA MEDICATION  *UNUSUAL SHORTNESS OF BREATH  *UNUSUAL BRUISING OR BLEEDING  TENDERNESS IN MOUTH AND THROAT WITH OR WITHOUT PRESENCE OF ULCERS  *URINARY PROBLEMS  *BOWEL PROBLEMS  UNUSUAL RASH Items with * indicate a potential emergency and should be followed up as soon as possible.  Feel free to call the clinic you have any questions or concerns. The clinic phone number is [phone].   I have been informed and understand all the instructions given to me. I know to contact the clinic, my physician, or go to the Emergency Department if any problems should occur. I do not have any questions at this time, but understand that I may call the clinic during office hours   should I have any questions or need assistance in obtaining follow up care.    __________________________________________  _____________  __________ Signature of Patient or Authorized Representative            Date                   Time    __________________________________________ Nurse's Signature    

## 2012-03-02 NOTE — Patient Instructions (Addendum)
Proceed with chemotherapy.  We will see you back next week.  Please call us if you have any questions or concerns.

## 2012-03-06 ENCOUNTER — Other Ambulatory Visit: Payer: 59 | Admitting: Lab

## 2012-03-06 ENCOUNTER — Ambulatory Visit: Payer: 59 | Admitting: Adult Health

## 2012-03-09 ENCOUNTER — Ambulatory Visit (HOSPITAL_BASED_OUTPATIENT_CLINIC_OR_DEPARTMENT_OTHER): Payer: 59

## 2012-03-09 ENCOUNTER — Other Ambulatory Visit (HOSPITAL_BASED_OUTPATIENT_CLINIC_OR_DEPARTMENT_OTHER): Payer: 59 | Admitting: Lab

## 2012-03-09 ENCOUNTER — Encounter: Payer: Self-pay | Admitting: Oncology

## 2012-03-09 ENCOUNTER — Ambulatory Visit (HOSPITAL_BASED_OUTPATIENT_CLINIC_OR_DEPARTMENT_OTHER): Payer: 59 | Admitting: Oncology

## 2012-03-09 VITALS — BP 110/69 | HR 84 | Temp 98.5°F | Resp 20 | Ht 64.0 in | Wt 160.4 lb

## 2012-03-09 DIAGNOSIS — D0590 Unspecified type of carcinoma in situ of unspecified breast: Secondary | ICD-10-CM

## 2012-03-09 DIAGNOSIS — C50419 Malignant neoplasm of upper-outer quadrant of unspecified female breast: Secondary | ICD-10-CM

## 2012-03-09 DIAGNOSIS — Z5111 Encounter for antineoplastic chemotherapy: Secondary | ICD-10-CM

## 2012-03-09 LAB — CBC WITH DIFFERENTIAL/PLATELET
Basophils Absolute: 0 10*3/uL (ref 0.0–0.1)
EOS%: 1.9 % (ref 0.0–7.0)
Eosinophils Absolute: 0.1 10*3/uL (ref 0.0–0.5)
HCT: 31.7 % — ABNORMAL LOW (ref 34.8–46.6)
HGB: 10.4 g/dL — ABNORMAL LOW (ref 11.6–15.9)
LYMPH%: 14.4 % (ref 14.0–49.7)
MCH: 34.4 pg — ABNORMAL HIGH (ref 25.1–34.0)
MCV: 105 fL — ABNORMAL HIGH (ref 79.5–101.0)
MONO%: 7.2 % (ref 0.0–14.0)
NEUT#: 4.4 10*3/uL (ref 1.5–6.5)
NEUT%: 75.8 % (ref 38.4–76.8)
Platelets: 312 10*3/uL (ref 145–400)
RDW: 16.9 % — ABNORMAL HIGH (ref 11.2–14.5)

## 2012-03-09 LAB — COMPREHENSIVE METABOLIC PANEL (CC13)
Albumin: 3.7 g/dL (ref 3.5–5.0)
BUN: 14 mg/dL (ref 7.0–26.0)
CO2: 23 mEq/L (ref 22–29)
Glucose: 86 mg/dl (ref 70–99)
Sodium: 142 mEq/L (ref 136–145)
Total Bilirubin: 0.29 mg/dL (ref 0.20–1.20)
Total Protein: 6.2 g/dL — ABNORMAL LOW (ref 6.4–8.3)

## 2012-03-09 MED ORDER — DIPHENHYDRAMINE HCL 50 MG/ML IJ SOLN
50.0000 mg | Freq: Once | INTRAMUSCULAR | Status: AC
  Administered 2012-03-09: 50 mg via INTRAVENOUS

## 2012-03-09 MED ORDER — PACLITAXEL CHEMO INJECTION 300 MG/50ML
80.0000 mg/m2 | Freq: Once | INTRAVENOUS | Status: AC
  Administered 2012-03-09: 144 mg via INTRAVENOUS
  Filled 2012-03-09: qty 24

## 2012-03-09 MED ORDER — HEPARIN SOD (PORK) LOCK FLUSH 100 UNIT/ML IV SOLN
500.0000 [IU] | Freq: Once | INTRAVENOUS | Status: AC | PRN
  Administered 2012-03-09: 500 [IU]
  Filled 2012-03-09: qty 5

## 2012-03-09 MED ORDER — SODIUM CHLORIDE 0.9 % IJ SOLN
10.0000 mL | INTRAMUSCULAR | Status: DC | PRN
  Administered 2012-03-09: 10 mL
  Filled 2012-03-09: qty 10

## 2012-03-09 MED ORDER — DEXAMETHASONE SODIUM PHOSPHATE 4 MG/ML IJ SOLN
20.0000 mg | Freq: Once | INTRAMUSCULAR | Status: AC
  Administered 2012-03-09: 20 mg via INTRAVENOUS

## 2012-03-09 MED ORDER — FAMOTIDINE IN NACL 20-0.9 MG/50ML-% IV SOLN
20.0000 mg | Freq: Once | INTRAVENOUS | Status: AC
  Administered 2012-03-09: 20 mg via INTRAVENOUS

## 2012-03-09 MED ORDER — SODIUM CHLORIDE 0.9 % IV SOLN
Freq: Once | INTRAVENOUS | Status: AC
  Administered 2012-03-09: 14:00:00 via INTRAVENOUS

## 2012-03-09 MED ORDER — ONDANSETRON 8 MG/50ML IVPB (CHCC)
8.0000 mg | Freq: Once | INTRAVENOUS | Status: AC
  Administered 2012-03-09: 8 mg via INTRAVENOUS

## 2012-03-09 NOTE — Progress Notes (Signed)
OFFICE PROGRESS NOTE  CC  TULLO,TERESA, MD [address] [place] [address] Dr. Chipper Herb Dr. 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 Essentially patient has bilateral breast cancers.  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   #2 patient has begun FEC 100 starting on 12/08/2011 - 01/18/12. A total of 4 cycles of this chemotherapy is planned and then thereafter she will proceed with Taxol as a single agent for total of 12 weeks.  #3 on 02/02/2012 patient will begin singly agent weekly Taxol for a total of 12 weeks.  CURRENT THERAPY Cycle 6 of Taxol  INTERVAL HISTORY: Mary [name] 51 y.o. female returns for follow-up visit today. She's doing well with the Taxol. Her port has been working well, and she has had no difficulties, particularly with numbness, tingling, fevers, chills or any other concerns.    MEDICAL HISTORY: Past Medical History  Diagnosis Date  . Irritable bowel syndrome   . GERD (gastroesophageal reflux disease)     Nexium as needed  . Breast cancer August 2013    bilateral  . Dental crowns present     x 4  . Neutropenia, drug-induced 12/15/2011    ALLERGIES:   has no known allergies.  MEDICATIONS:  Current Outpatient Prescriptions  Medication Sig Dispense Refill  . ALPRAZolam (XANAX) 0.25 MG tablet Take 0.25 mg by mouth 3 (three) times daily as needed.       . Ascorbic Acid (VITAMIN C) 100 MG tablet Take 100 mg by mouth daily.        Marland Kitchen b complex vitamins tablet Take 1 tablet by mouth daily.       . calcium carbonate (OS-CAL) 600 MG TABS Take 600 mg by mouth 2 (two) times daily with a meal.        . cholecalciferol (VITAMIN D) 400 UNITS TABS Take by mouth.      . ciprofloxacin  (CIPRO) 500 MG tablet Take 1 tablet (500 mg total) by mouth 2 (two) times daily.  14 tablet  6  . dexamethasone (DECADRON) 4 MG tablet Take 2 tablets (8 mg total) by mouth 2 (two) times daily with a meal. Take daily starting the day after chemotherapy for 2 days. Take with food.  30 tablet  1  . lidocaine-prilocaine (EMLA) cream Apply topically as needed.  30 g  6  . loperamide (IMODIUM A-D) 2 MG tablet Take 2 mg by mouth 4 (four) times daily as needed.      Marland Kitchen LORazepam (ATIVAN) 0.5 MG tablet Take 1 tablet (0.5 mg total) by mouth every 8 (eight) hours.  60 tablet  3  . Misc. Devices MISC 1 each by Does not apply route daily.  1 each  0  . Multiple Vitamin (MULTIVITAMIN) tablet Take 1 tablet by mouth daily.        Marland Kitchen NEXIUM 40 MG capsule TAKE 1 CAPSULE BY MOUTH DAILY.  30 capsule  6  . ondansetron (ZOFRAN) 8 MG tablet Take 1 tablet (8 mg total) by mouth 2 (two) times daily. Take two times a day starting the day after chemo for 2 days. Then take two times a day as needed for nausea or vomiting.  30 tablet  1  . prochlorperazine (COMPAZINE) 10  MG tablet Take 1 tablet (10 mg total) by mouth every 6 (six) hours as needed (Nausea or vomiting).  30 tablet  1  . prochlorperazine (COMPAZINE) 25 MG suppository Place 1 suppository (25 mg total) rectally every 12 (twelve) hours as needed for nausea.  12 suppository  3  . vitamin B-12 (CYANOCOBALAMIN) 100 MCG tablet Take 50 mcg by mouth daily.          SURGICAL HISTORY:  Past Surgical History  Procedure Date  . Breast lumpectomy 2008    left  . Portacath placement 12/02/2011    Procedure: INSERTION PORT-A-CATH;  Surgeon: Ernestene Mention, MD;  Location:  [facility];  Service: General;  Laterality: N/A;  insertion port a cath with fluoroscopy    REVIEW OF SYSTEMS:   General: fatigue (+), night sweats (-), fever (-), pain (-) Lymph: palpable nodes (-) HEENT: vision changes (-), mucositis (-), gum bleeding (-), epistaxis (-) Cardiovascular:  chest pain (-), palpitations (-) Pulmonary: shortness of breath (-), dyspnea on exertion (-), cough (-), hemoptysis (-) GI:  Early satiety (-), melena (-), dysphagia (-), nausea/vomiting (-), diarrhea (-) GU: dysuria (-), hematuria (-), incontinence (-) Musculoskeletal: joint swelling (-), joint pain (-), back pain (-) Neuro: weakness (-), numbness (-), headache (-), confusion (-) Skin: Rash (-), lesions (-), dryness (-) Psych: depression (-), suicidal/homicidal ideation (-), feeling of hopelessness (-)   PHYSICAL EXAMINATION:  BP 110/69  Pulse 84  Temp 98.5 F (36.9 C) (Oral)  Resp 20  Ht 5\' 4"  (1.626 m)  Wt 160 lb 6.4 oz (72.757 kg)  BMI 27.53 kg/m2 General: Patient is a well appearing female in no acute distress HEENT: PERRLA, sclerae anicteric no conjunctival pallor, MMM Neck: supple, no palpable adenopathy Lungs: clear to auscultation bilaterally, no wheezes, rhonchi, or rales Cardiovascular: regular rate rhythm, S1, S2, no murmurs, rubs or gallops Abdomen: Soft, non-tender, non-distended, normoactive bowel sounds, no HSM Extremities: warm and well perfused, no clubbing, cyanosis, or edema Skin: No lesions or rash Neuro: Non-focal Breast: right breast with retracted nipple, and palpable mass, left breast, no masses or abnormalities, the breast is much softer and the mass is now mobile ECOG PERFORMANCE STATUS: 0 - Asymptomatic   LABORATORY DATA: Lab Results  Component Value Date   WBC 5.8 03/09/2012   HGB 10.4* 03/09/2012   HCT 31.7* 03/09/2012   MCV 105.0* 03/09/2012   PLT 312 03/09/2012      Chemistry      Component Value Date/Time   NA 141 1Jun 24, 202013 1045   NA 140 05/20/2011 1204   K 4.0 1Jun 24, 202013 1045   K 5.2 05/20/2011 1204   CL 108* 1Jun 24, 202013 1045   CL 108 05/20/2011 1204   CO2 23 1Jun 24, 202013 1045   CO2 22 05/20/2011 1204   BUN 15.0 1Jun 24, 202013 1045   BUN 10 05/20/2011 1204   CREATININE 0.8 1Jun 24, 202013 1045   CREATININE 0.82 05/20/2011 1204   CREATININE 0.9  01/18/2011 1202      Component Value Date/Time   CALCIUM 9.5 1Jun 24, 202013 1045   CALCIUM 8.6 05/20/2011 1204   ALKPHOS 96 1Jun 24, 202013 1045   ALKPHOS 80 05/20/2011 1204   AST 20 1Jun 24, 202013 1045   AST 21 05/20/2011 1204   ALT 29 1Jun 24, 202013 1045   ALT 29 05/20/2011 1204   BILITOT 0.32 1Jun 24, 202013 1045   BILITOT 0.3 05/20/2011 1204       RADIOGRAPHIC STUDIES:  ASSESSMENT: 51 year old female with  #1 T3, N1, M0 invasive lobular carcinoma of the right breast with  DCIS of the left breast. Patient is now going to receive neoadjuvant chemotherapy consisting of FEC 100. She will receive dose dense FEC with day 2 Neulasta. She is overall doing well she understands the rationale for neoadjuvant treatment she understands the side effects of her chemotherapy. She also has her antiemetics already at her disposal to take as directed.  #2 patient has now completed 4 cycles of neoadjuvant FEC 100.  #3 she will begin now again neoadjuvant single agent Taxol weekly beginning 02/02/2012.   PLAN:  #1 patient will proceed with cycle 6 of Taxol today.  So far she's tolerating the treatment well.    #2 We will see Mary [name] back next Friday for her 7th  cycle of Taxol.     All questions were answered. The patient knows to call the clinic with any problems, questions or concerns. We can certainly see the patient much sooner if necessary.  I spent 25 minutes counseling the patient face to face. The total time spent in the appointment was 30 minutes.  Drue Second, MD Medical/Oncology [facility] [phone] (beeper) [phone] (Office)  03/09/2012, 12:29 PM

## 2012-03-09 NOTE — Patient Instructions (Addendum)
Proceed with chemotherapy today 

## 2012-03-12 ENCOUNTER — Ambulatory Visit: Payer: 59 | Admitting: Adult Health

## 2012-03-13 ENCOUNTER — Ambulatory Visit: Payer: 59 | Admitting: Adult Health

## 2012-03-13 ENCOUNTER — Other Ambulatory Visit: Payer: 59 | Admitting: Lab

## 2012-03-16 ENCOUNTER — Ambulatory Visit (HOSPITAL_BASED_OUTPATIENT_CLINIC_OR_DEPARTMENT_OTHER): Payer: 59

## 2012-03-16 ENCOUNTER — Encounter: Payer: Self-pay | Admitting: Adult Health

## 2012-03-16 ENCOUNTER — Telehealth: Payer: Self-pay | Admitting: *Deleted

## 2012-03-16 ENCOUNTER — Ambulatory Visit (HOSPITAL_BASED_OUTPATIENT_CLINIC_OR_DEPARTMENT_OTHER): Payer: 59 | Admitting: Adult Health

## 2012-03-16 ENCOUNTER — Other Ambulatory Visit (HOSPITAL_BASED_OUTPATIENT_CLINIC_OR_DEPARTMENT_OTHER): Payer: 59 | Admitting: Lab

## 2012-03-16 VITALS — BP 122/86 | HR 83 | Temp 98.5°F | Resp 20 | Ht 64.0 in | Wt 160.0 lb

## 2012-03-16 DIAGNOSIS — D0590 Unspecified type of carcinoma in situ of unspecified breast: Secondary | ICD-10-CM

## 2012-03-16 DIAGNOSIS — C50419 Malignant neoplasm of upper-outer quadrant of unspecified female breast: Secondary | ICD-10-CM

## 2012-03-16 DIAGNOSIS — Z5111 Encounter for antineoplastic chemotherapy: Secondary | ICD-10-CM

## 2012-03-16 LAB — CBC WITH DIFFERENTIAL/PLATELET
BASO%: 0.7 % (ref 0.0–2.0)
Basophils Absolute: 0 10*3/uL (ref 0.0–0.1)
EOS%: 2.7 % (ref 0.0–7.0)
HCT: 33.3 % — ABNORMAL LOW (ref 34.8–46.6)
HGB: 11 g/dL — ABNORMAL LOW (ref 11.6–15.9)
LYMPH%: 12.1 % — ABNORMAL LOW (ref 14.0–49.7)
MCH: 34.6 pg — ABNORMAL HIGH (ref 25.1–34.0)
MCHC: 33 g/dL (ref 31.5–36.0)
MCV: 104.7 fL — ABNORMAL HIGH (ref 79.5–101.0)
NEUT%: 75.7 % (ref 38.4–76.8)
Platelets: 333 10*3/uL (ref 145–400)
lymph#: 0.7 10*3/uL — ABNORMAL LOW (ref 0.9–3.3)

## 2012-03-16 LAB — COMPREHENSIVE METABOLIC PANEL (CC13)
CO2: 22 mEq/L (ref 22–29)
Calcium: 9.5 mg/dL (ref 8.4–10.4)
Creatinine: 0.8 mg/dL (ref 0.6–1.1)
Glucose: 102 mg/dl — ABNORMAL HIGH (ref 70–99)
Sodium: 141 mEq/L (ref 136–145)
Total Bilirubin: 0.23 mg/dL (ref 0.20–1.20)
Total Protein: 6.5 g/dL (ref 6.4–8.3)

## 2012-03-16 MED ORDER — SODIUM CHLORIDE 0.9 % IJ SOLN
10.0000 mL | INTRAMUSCULAR | Status: DC | PRN
  Administered 2012-03-16: 10 mL
  Filled 2012-03-16: qty 10

## 2012-03-16 MED ORDER — DIPHENHYDRAMINE HCL 50 MG/ML IJ SOLN
50.0000 mg | Freq: Once | INTRAMUSCULAR | Status: AC
  Administered 2012-03-16: 50 mg via INTRAVENOUS

## 2012-03-16 MED ORDER — PACLITAXEL CHEMO INJECTION 300 MG/50ML
80.0000 mg/m2 | Freq: Once | INTRAVENOUS | Status: AC
  Administered 2012-03-16: 144 mg via INTRAVENOUS
  Filled 2012-03-16: qty 24

## 2012-03-16 MED ORDER — FAMOTIDINE IN NACL 20-0.9 MG/50ML-% IV SOLN
20.0000 mg | Freq: Once | INTRAVENOUS | Status: AC
  Administered 2012-03-16: 20 mg via INTRAVENOUS

## 2012-03-16 MED ORDER — DEXAMETHASONE SODIUM PHOSPHATE 4 MG/ML IJ SOLN
20.0000 mg | Freq: Once | INTRAMUSCULAR | Status: AC
  Administered 2012-03-16: 20 mg via INTRAVENOUS

## 2012-03-16 MED ORDER — SODIUM CHLORIDE 0.9 % IV SOLN
Freq: Once | INTRAVENOUS | Status: AC
  Administered 2012-03-16: 13:00:00 via INTRAVENOUS

## 2012-03-16 MED ORDER — HEPARIN SOD (PORK) LOCK FLUSH 100 UNIT/ML IV SOLN
500.0000 [IU] | Freq: Once | INTRAVENOUS | Status: AC | PRN
  Administered 2012-03-16: 500 [IU]
  Filled 2012-03-16: qty 5

## 2012-03-16 MED ORDER — ONDANSETRON 8 MG/50ML IVPB (CHCC)
8.0000 mg | Freq: Once | INTRAVENOUS | Status: AC
  Administered 2012-03-16: 8 mg via INTRAVENOUS

## 2012-03-16 NOTE — Patient Instructions (Addendum)
[facility] Discharge Instructions for Patients Receiving Chemotherapy  Today you received the following chemotherapy agents:  TAXOL  To help prevent nausea and vomiting after your treatment, we encourage you to take your nausea medication as needed. If you develop nausea and vomiting that is not controlled by your nausea medication, call the clinic. If it is after clinic hours your family physician or the after hours number for the clinic or go to the Emergency Department.   BELOW ARE SYMPTOMS THAT SHOULD BE REPORTED IMMEDIATELY:  *FEVER GREATER THAN 100.5 F  *CHILLS WITH OR WITHOUT FEVER  NAUSEA AND VOMITING THAT IS NOT CONTROLLED WITH YOUR NAUSEA MEDICATION  *UNUSUAL SHORTNESS OF BREATH  *UNUSUAL BRUISING OR BLEEDING  TENDERNESS IN MOUTH AND THROAT WITH OR WITHOUT PRESENCE OF ULCERS  *URINARY PROBLEMS  *BOWEL PROBLEMS  UNUSUAL RASH Items with * indicate a potential emergency and should be followed up as soon as possible.  Please let the nurse know about any problems that you may have experienced. Feel free to call the clinic you have any questions or concerns. The clinic phone number is [phone].   I have been informed and understand all the instructions given to me. I know to contact the clinic, my physician, or go to the Emergency Department if any problems should occur. I do not have any questions at this time, but understand that I may call the clinic during office hours   should I have any questions or need assistance in obtaining follow up care.

## 2012-03-16 NOTE — Telephone Encounter (Signed)
double book for 1045 on 03/23/12 cancel 0915 appt

## 2012-03-16 NOTE — Progress Notes (Signed)
OFFICE PROGRESS NOTE  CC  TULLO,TERESA, MD [address] [place] [address] Dr. Chipper Herb Dr. 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 Essentially patient has bilateral breast cancers.  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   #2 patient has begun FEC 100 starting on 12/08/2011 - 01/18/12. A total of 4 cycles of this chemotherapy is planned and then thereafter she will proceed with Taxol as a single agent for total of 12 weeks.  #3 on 02/02/2012 patient will begin singly agent weekly Taxol for a total of 12 weeks.  CURRENT THERAPY Cycle 7 of Taxol  INTERVAL HISTORY: Mary [name] 51 y.o. female returns for follow-up visit today. She continues to do well with her Taxol.  She has had no numbness or tingling, or changes in her nails.  She has occasional joint pain, and an occasional shooting pain in her left toes that began last Tuesday.  Otherwise she's doing well and a 10 point ROS is neg.   MEDICAL HISTORY: Past Medical History  Diagnosis Date  . Irritable bowel syndrome   . GERD (gastroesophageal reflux disease)     Nexium as needed  . Breast cancer August 2013    bilateral  . Dental crowns present     x 4  . Neutropenia, drug-induced 12/15/2011    ALLERGIES:   has no known allergies.  MEDICATIONS:  Current Outpatient Prescriptions  Medication Sig Dispense Refill  . ALPRAZolam (XANAX) 0.25 MG tablet Take 0.25 mg by mouth 3 (three) times daily as needed.       . Ascorbic Acid (VITAMIN C) 100 MG tablet Take 100 mg by mouth daily.        Marland Kitchen b complex vitamins tablet Take 1 tablet by mouth daily.       . calcium carbonate (OS-CAL) 600 MG TABS Take 600 mg by mouth 2 (two) times daily with a meal.         . cholecalciferol (VITAMIN D) 400 UNITS TABS Take by mouth.      . ciprofloxacin (CIPRO) 500 MG tablet Take 1 tablet (500 mg total) by mouth 2 (two) times daily.  14 tablet  6  . dexamethasone (DECADRON) 4 MG tablet Take 2 tablets (8 mg total) by mouth 2 (two) times daily with a meal. Take daily starting the day after chemotherapy for 2 days. Take with food.  30 tablet  1  . lidocaine-prilocaine (EMLA) cream Apply topically as needed.  30 g  6  . loperamide (IMODIUM A-D) 2 MG tablet Take 2 mg by mouth 4 (four) times daily as needed.      Marland Kitchen LORazepam (ATIVAN) 0.5 MG tablet Take 1 tablet (0.5 mg total) by mouth every 8 (eight) hours.  60 tablet  3  . Misc. Devices MISC 1 each by Does not apply route daily.  1 each  0  . Multiple Vitamin (MULTIVITAMIN) tablet Take 1 tablet by mouth daily.        Marland Kitchen NEXIUM 40 MG capsule TAKE 1 CAPSULE BY MOUTH DAILY.  30 capsule  6  . ondansetron (ZOFRAN) 8 MG tablet Take 1 tablet (8 mg total) by mouth 2 (two) times daily. Take two times a day starting the day after chemo for 2  days. Then take two times a day as needed for nausea or vomiting.  30 tablet  1  . prochlorperazine (COMPAZINE) 10 MG tablet Take 1 tablet (10 mg total) by mouth every 6 (six) hours as needed (Nausea or vomiting).  30 tablet  1  . prochlorperazine (COMPAZINE) 25 MG suppository Place 1 suppository (25 mg total) rectally every 12 (twelve) hours as needed for nausea.  12 suppository  3  . vitamin B-12 (CYANOCOBALAMIN) 100 MCG tablet Take 50 mcg by mouth daily.         No current facility-administered medications for this visit.   Facility-Administered Medications Ordered in Other Visits  Medication Dose Route Frequency Provider Last Rate Last Dose  . sodium chloride 0.9 % injection 10 mL  10 mL Intracatheter PRN Victorino December, MD   10 mL at 03/16/12 1409    SURGICAL HISTORY:  Past Surgical History  Procedure Date  . Breast lumpectomy 2008    left  . Portacath placement 12/02/2011     Procedure: INSERTION PORT-A-CATH;  Surgeon: Ernestene Mention, MD;  Location: [facility];  Service: General;  Laterality: N/A;  insertion port a cath with fluoroscopy    REVIEW OF SYSTEMS:   General: fatigue (+), night sweats (-), fever (-), pain (-) Lymph: palpable nodes (-) HEENT: vision changes (-), mucositis (-), gum bleeding (-), epistaxis (-) Cardiovascular: chest pain (-), palpitations (-) Pulmonary: shortness of breath (-), dyspnea on exertion (-), cough (-), hemoptysis (-) GI:  Early satiety (-), melena (-), dysphagia (-), nausea/vomiting (-), diarrhea (-) GU: dysuria (-), hematuria (-), incontinence (-) Musculoskeletal: joint swelling (-), joint pain (-), back pain (-) Neuro: weakness (-), numbness (-), headache (-), confusion (-) Skin: Rash (-), lesions (-), dryness (-) Psych: depression (-), suicidal/homicidal ideation (-), feeling of hopelessness (-)   PHYSICAL EXAMINATION:  BP 122/86  Pulse 83  Temp 98.5 F (36.9 C) (Oral)  Resp 20  Ht 5\' 4"  (1.626 m)  Wt 160 lb (72.576 kg)  BMI 27.46 kg/m2 General: Patient is a well appearing female in no acute distress HEENT: PERRLA, sclerae anicteric no conjunctival pallor, MMM Neck: supple, no palpable adenopathy Lungs: clear to auscultation bilaterally, no wheezes, rhonchi, or rales Cardiovascular: regular rate rhythm, S1, S2, no murmurs, rubs or gallops Abdomen: Soft, non-tender, non-distended, normoactive bowel sounds, no HSM Extremities: warm and well perfused, no clubbing, cyanosis, or edema Skin: No lesions or rash Neuro: Non-focal Breast: right breast with retracted nipple, and palpable mass, left breast, no masses or abnormalities, the breast is much softer and the mass is now mobile ECOG PERFORMANCE STATUS: 0 - Asymptomatic   LABORATORY DATA: Lab Results  Component Value Date   WBC 5.5 03/16/2012   HGB 11.0* 03/16/2012   HCT 33.3* 03/16/2012   MCV 104.7* 03/16/2012   PLT 333 03/16/2012       Chemistry      Component Value Date/Time   NA 141 03/16/2012 1042   NA 140 05/20/2011 1204   K 4.0 03/16/2012 1042   K 5.2 05/20/2011 1204   CL 106 03/16/2012 1042   CL 108 05/20/2011 1204   CO2 22 03/16/2012 1042   CO2 22 05/20/2011 1204   BUN 16.0 03/16/2012 1042   BUN 10 05/20/2011 1204   CREATININE 0.8 03/16/2012 1042   CREATININE 0.82 05/20/2011 1204   CREATININE 0.9 01/18/2011 1202      Component Value Date/Time   CALCIUM 9.5 03/16/2012 1042   CALCIUM 8.6 05/20/2011 1204  ALKPHOS 104 03/16/2012 1042   ALKPHOS 80 05/20/2011 1204   AST 17 03/16/2012 1042   AST 21 05/20/2011 1204   ALT 22 03/16/2012 1042   ALT 29 05/20/2011 1204   BILITOT 0.23 03/16/2012 1042   BILITOT 0.3 05/20/2011 1204       RADIOGRAPHIC STUDIES:  ASSESSMENT: 51 year old female with  #1 T3, N1, 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 Patient is now going to receive neoadjuvant chemotherapy consisting of FEC 100. She will receive dose dense FEC with day 2 Neulasta. She is overall doing well she understands the rationale for neoadjuvant treatment she understands the side effects of her chemotherapy. She also has her antiemetics already at her disposal to take as directed.  #2 patient has now completed 4 cycles of neoadjuvant FEC 100.  #3 she will begin now again neoadjuvant single agent Taxol weekly beginning 02/02/2012.   PLAN:  #1 patient will proceed with cycle 7 of Taxol today.  So far she's tolerating the treatment well.    #2 We will see Ms. Waskey back next Friday for her eighth cycle of Taxol.     All questions were answered. The patient knows to call the clinic with any problems, questions or concerns. We can certainly see the patient much sooner if necessary.  I spent 25 minutes counseling the patient face to face. The total time spent in the appointment was 30 minutes.  Cherie Ouch Lyn Hollingshead, NP Medical Oncology [facility] Phone: [phone] 03/16/2012, 2:55 PM

## 2012-03-16 NOTE — Patient Instructions (Addendum)
Doing well.  Proceed with chemotherapy.  Please call us if you have any questions or concerns.    

## 2012-03-20 ENCOUNTER — Other Ambulatory Visit: Payer: 59 | Admitting: Lab

## 2012-03-23 ENCOUNTER — Other Ambulatory Visit: Payer: 59 | Admitting: Lab

## 2012-03-23 ENCOUNTER — Ambulatory Visit (HOSPITAL_BASED_OUTPATIENT_CLINIC_OR_DEPARTMENT_OTHER): Payer: 59 | Admitting: Adult Health

## 2012-03-23 ENCOUNTER — Other Ambulatory Visit (HOSPITAL_BASED_OUTPATIENT_CLINIC_OR_DEPARTMENT_OTHER): Payer: 59 | Admitting: Lab

## 2012-03-23 ENCOUNTER — Ambulatory Visit: Payer: 59 | Admitting: Adult Health

## 2012-03-23 ENCOUNTER — Ambulatory Visit (HOSPITAL_BASED_OUTPATIENT_CLINIC_OR_DEPARTMENT_OTHER): Payer: 59

## 2012-03-23 ENCOUNTER — Encounter: Payer: Self-pay | Admitting: Adult Health

## 2012-03-23 VITALS — BP 133/78 | HR 83 | Temp 98.1°F | Resp 20 | Ht 64.0 in | Wt 162.8 lb

## 2012-03-23 DIAGNOSIS — C50419 Malignant neoplasm of upper-outer quadrant of unspecified female breast: Secondary | ICD-10-CM

## 2012-03-23 DIAGNOSIS — Z5111 Encounter for antineoplastic chemotherapy: Secondary | ICD-10-CM

## 2012-03-23 DIAGNOSIS — D0590 Unspecified type of carcinoma in situ of unspecified breast: Secondary | ICD-10-CM

## 2012-03-23 LAB — COMPREHENSIVE METABOLIC PANEL (CC13)
ALT: 14 U/L (ref 0–55)
AST: 16 U/L (ref 5–34)
BUN: 15 mg/dL (ref 7.0–26.0)
Creatinine: 0.8 mg/dL (ref 0.6–1.1)
Total Bilirubin: 0.2 mg/dL (ref 0.20–1.20)

## 2012-03-23 LAB — CBC WITH DIFFERENTIAL/PLATELET
BASO%: 0.7 % (ref 0.0–2.0)
Basophils Absolute: 0 10*3/uL (ref 0.0–0.1)
EOS%: 2.7 % (ref 0.0–7.0)
HCT: 32.9 % — ABNORMAL LOW (ref 34.8–46.6)
LYMPH%: 12.6 % — ABNORMAL LOW (ref 14.0–49.7)
MCH: 34.2 pg — ABNORMAL HIGH (ref 25.1–34.0)
MCHC: 32.5 g/dL (ref 31.5–36.0)
MCV: 105.1 fL — ABNORMAL HIGH (ref 79.5–101.0)
MONO%: 7.9 % (ref 0.0–14.0)
NEUT%: 76.1 % (ref 38.4–76.8)
Platelets: 351 10*3/uL (ref 145–400)

## 2012-03-23 MED ORDER — FAMOTIDINE IN NACL 20-0.9 MG/50ML-% IV SOLN
20.0000 mg | Freq: Once | INTRAVENOUS | Status: AC
  Administered 2012-03-23: 20 mg via INTRAVENOUS

## 2012-03-23 MED ORDER — SODIUM CHLORIDE 0.9 % IV SOLN
Freq: Once | INTRAVENOUS | Status: AC
  Administered 2012-03-23: 12:00:00 via INTRAVENOUS

## 2012-03-23 MED ORDER — HEPARIN SOD (PORK) LOCK FLUSH 100 UNIT/ML IV SOLN
500.0000 [IU] | Freq: Once | INTRAVENOUS | Status: AC | PRN
  Administered 2012-03-23: 500 [IU]
  Filled 2012-03-23: qty 5

## 2012-03-23 MED ORDER — DEXAMETHASONE SODIUM PHOSPHATE 10 MG/ML IJ SOLN
20.0000 mg | Freq: Once | INTRAMUSCULAR | Status: AC
  Administered 2012-03-23: 20 mg via INTRAVENOUS

## 2012-03-23 MED ORDER — PACLITAXEL CHEMO INJECTION 300 MG/50ML
80.0000 mg/m2 | Freq: Once | INTRAVENOUS | Status: AC
  Administered 2012-03-23: 144 mg via INTRAVENOUS
  Filled 2012-03-23: qty 24

## 2012-03-23 MED ORDER — SODIUM CHLORIDE 0.9 % IJ SOLN
10.0000 mL | INTRAMUSCULAR | Status: DC | PRN
  Administered 2012-03-23: 10 mL
  Filled 2012-03-23: qty 10

## 2012-03-23 MED ORDER — DIPHENHYDRAMINE HCL 50 MG/ML IJ SOLN
50.0000 mg | Freq: Once | INTRAMUSCULAR | Status: AC
Start: 2012-03-23 — End: 2012-03-23
  Administered 2012-03-23: 50 mg via INTRAVENOUS

## 2012-03-23 MED ORDER — ONDANSETRON 8 MG/50ML IVPB (CHCC)
8.0000 mg | Freq: Once | INTRAVENOUS | Status: AC
  Administered 2012-03-23: 8 mg via INTRAVENOUS

## 2012-03-23 NOTE — Patient Instructions (Signed)
Doing well.  Proceed with Taxol.  Use saline nasal spray and try taking 4 tablets of the Ativan to help you sleep.  Please call us if you have any questions or concerns.

## 2012-03-23 NOTE — Progress Notes (Signed)
OFFICE PROGRESS NOTE  CC  TULLO,TERESA, MD [address] [place] [address] Dr. Chipper Herb Dr. 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 Essentially patient has bilateral breast cancers.  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   #2 patient has begun FEC 100 starting on 12/08/2011 - 01/18/12. A total of 4 cycles of this chemotherapy is planned and then thereafter she will proceed with Taxol as a single agent for total of 12 weeks.  #3 on 02/02/2012 patient will begin singly agent weekly Taxol for a total of 12 weeks.  CURRENT THERAPY Cycle 8 of Taxol  INTERVAL HISTORY: Mary [name] 52 y.o. female returns for follow-up visit today. She continues to do well with her Taxol.  She's mildly fatigued and is not sleeping well at night, and will sometimes toss and turn until 1-2 am.  Otherwise, she's feeling well and without any complaints.  She denies fevers, chills, nausea, vomiting, headaches, shortness of breath, numbness, tingling, or any other questions/concerns.    MEDICAL HISTORY: Past Medical History  Diagnosis Date  . Irritable bowel syndrome   . GERD (gastroesophageal reflux disease)     Nexium as needed  . Breast cancer August 2013    bilateral  . Dental crowns present     x 4  . Neutropenia, drug-induced 12/15/2011    ALLERGIES:   has no known allergies.  MEDICATIONS:  Current Outpatient Prescriptions  Medication Sig Dispense Refill  . ALPRAZolam (XANAX) 0.25 MG tablet Take 0.25 mg by mouth 3 (three) times daily as needed.       . Ascorbic Acid (VITAMIN C) 100 MG tablet Take 100 mg by mouth daily.        Marland Kitchen b complex vitamins tablet Take 1 tablet by mouth daily.       . calcium carbonate  (OS-CAL) 600 MG TABS Take 600 mg by mouth 2 (two) times daily with a meal.        . cholecalciferol (VITAMIN D) 400 UNITS TABS Take by mouth.      . dexamethasone (DECADRON) 4 MG tablet Take 2 tablets (8 mg total) by mouth 2 (two) times daily with a meal. Take daily starting the day after chemotherapy for 2 days. Take with food.  30 tablet  1  . lidocaine-prilocaine (EMLA) cream Apply topically as needed.  30 g  6  . loperamide (IMODIUM A-D) 2 MG tablet Take 2 mg by mouth 4 (four) times daily as needed.      Marland Kitchen LORazepam (ATIVAN) 0.5 MG tablet Take 1 tablet (0.5 mg total) by mouth every 8 (eight) hours.  60 tablet  3  . Misc. Devices MISC 1 each by Does not apply route daily.  1 each  0  . Multiple Vitamin (MULTIVITAMIN) tablet Take 1 tablet by mouth daily.        Marland Kitchen NEXIUM 40 MG capsule TAKE 1 CAPSULE BY MOUTH DAILY.  30 capsule  6  . ondansetron (ZOFRAN) 8 MG tablet Take 1 tablet (8 mg total) by mouth 2 (two) times daily. Take two times a day starting the day after chemo for 2 days. Then take two times a day as needed for nausea or vomiting.  30 tablet  1  . prochlorperazine (  COMPAZINE) 10 MG tablet Take 1 tablet (10 mg total) by mouth every 6 (six) hours as needed (Nausea or vomiting).  30 tablet  1  . prochlorperazine (COMPAZINE) 25 MG suppository Place 1 suppository (25 mg total) rectally every 12 (twelve) hours as needed for nausea.  12 suppository  3  . vitamin B-12 (CYANOCOBALAMIN) 100 MCG tablet Take 50 mcg by mouth daily.        . ciprofloxacin (CIPRO) 500 MG tablet Take 1 tablet (500 mg total) by mouth 2 (two) times daily.  14 tablet  6   No current facility-administered medications for this visit.   Facility-Administered Medications Ordered in Other Visits  Medication Dose Route Frequency Provider Last Rate Last Dose  . [COMPLETED] famotidine (PEPCID) IVPB 20 mg  20 mg Intravenous Once Victorino December, MD   20 mg at 03/23/12 1222  . heparin lock flush 100 unit/mL  500 Units Intracatheter  Once PRN Victorino December, MD      . PACLitaxel (TAXOL) 144 mg in dextrose 5 % 250 mL chemo infusion (</= 80mg /m2)  80 mg/m2 (Treatment Plan Actual) Intravenous Once Victorino December, MD      . sodium chloride 0.9 % injection 10 mL  10 mL Intracatheter PRN Victorino December, MD        SURGICAL HISTORY:  Past Surgical History  Procedure Date  . Breast lumpectomy 2008    left  . Portacath placement 12/02/2011    Procedure: INSERTION PORT-A-CATH;  Surgeon: Ernestene Mention, MD;  Location: [facility];  Service: General;  Laterality: N/A;  insertion port a cath with fluoroscopy    REVIEW OF SYSTEMS:   General: fatigue (+), night sweats (-), fever (-), pain (-) Lymph: palpable nodes (-) HEENT: vision changes (-), mucositis (-), gum bleeding (-), epistaxis (-) Cardiovascular: chest pain (-), palpitations (-) Pulmonary: shortness of breath (-), dyspnea on exertion (-), cough (-), hemoptysis (-) GI:  Early satiety (-), melena (-), dysphagia (-), nausea/vomiting (-), diarrhea (-) GU: dysuria (-), hematuria (-), incontinence (-) Musculoskeletal: joint swelling (-), joint pain (-), back pain (-) Neuro: weakness (-), numbness (-), headache (-), confusion (-) Skin: Rash (-), lesions (-), dryness (-) Psych: depression (-), suicidal/homicidal ideation (-), feeling of hopelessness (-)   PHYSICAL EXAMINATION:  BP 133/78  Pulse 83  Temp 98.1 F (36.7 C)  Resp 20  Ht 5\' 4"  (1.626 m)  Wt 162 lb 12.8 oz (73.846 kg)  BMI 27.94 kg/m2 General: Patient is a well appearing female in no acute distress HEENT: PERRLA, sclerae anicteric no conjunctival pallor, MMM Neck: supple, no palpable adenopathy Lungs: clear to auscultation bilaterally, no wheezes, rhonchi, or rales Cardiovascular: regular rate rhythm, S1, S2, no murmurs, rubs or gallops Abdomen: Soft, non-tender, non-distended, normoactive bowel sounds, no HSM Extremities: warm and well perfused, no clubbing, cyanosis, or edema Skin: No  lesions or rash Neuro: Non-focal Breast: right breast with retracted nipple, and palpable mass, left breast, no masses or abnormalities, the breast is much softer and the mass is now mobile ECOG PERFORMANCE STATUS: 0 - Asymptomatic   LABORATORY DATA: Lab Results  Component Value Date   WBC 6.0 03/23/2012   HGB 10.7* 03/23/2012   HCT 32.9* 03/23/2012   MCV 105.1* 03/23/2012   PLT 351 03/23/2012      Chemistry      Component Value Date/Time   NA 141 03/16/2012 1042   NA 140 05/20/2011 1204   K 4.0 03/16/2012 1042   K  5.2 05/20/2011 1204   CL 106 03/16/2012 1042   CL 108 05/20/2011 1204   CO2 22 03/16/2012 1042   CO2 22 05/20/2011 1204   BUN 16.0 03/16/2012 1042   BUN 10 05/20/2011 1204   CREATININE 0.8 03/16/2012 1042   CREATININE 0.82 05/20/2011 1204   CREATININE 0.9 01/18/2011 1202      Component Value Date/Time   CALCIUM 9.5 03/16/2012 1042   CALCIUM 8.6 05/20/2011 1204   ALKPHOS 104 03/16/2012 1042   ALKPHOS 80 05/20/2011 1204   AST 17 03/16/2012 1042   AST 21 05/20/2011 1204   ALT 22 03/16/2012 1042   ALT 29 05/20/2011 1204   BILITOT 0.23 03/16/2012 1042   BILITOT 0.3 05/20/2011 1204       RADIOGRAPHIC STUDIES:  ASSESSMENT: 52 year old female with  #1 T3, N1, 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 Patient is now going to receive neoadjuvant chemotherapy consisting of FEC 100. She will receive dose dense FEC with day 2 Neulasta. She is overall doing well she understands the rationale for neoadjuvant treatment she understands the side effects of her chemotherapy. She also has her antiemetics already at her disposal to take as directed.  #2 patient has now completed 4 cycles of neoadjuvant FEC 100.  #3 she will begin now again neoadjuvant single agent Taxol weekly beginning 02/02/2012.   PLAN:  #1 patient will proceed with cycle 8 of Taxol today. She is continuing to tolerate her treatment well.    #2 We will see Ms. Cabiness back next Friday for  her 9th cycle of Taxol.    #3 She will take 2mg  of Ativan at night to see if helps her sleep instead of 1mg .     All questions were answered. The patient knows to call the clinic with any problems, questions or concerns. We can certainly see the patient much sooner if necessary.  I spent 25 minutes counseling the patient face to face. The total time spent in the appointment was 30 minutes.  Cherie Ouch Lyn Hollingshead, NP Medical Oncology [facility] Phone: [phone] 03/23/2012, 12:36 PM

## 2012-03-23 NOTE — Patient Instructions (Signed)
[facility] Discharge Instructions for Patients Receiving Chemotherapy  Today you received the following chemotherapy agents Taxol  To help prevent nausea and vomiting after your treatment, we encourage you to take your nausea medication as prescribed.  If you develop nausea and vomiting that is not controlled by your nausea medication, call the clinic. If it is after clinic hours your family physician or the after hours number for the clinic or go to the Emergency Department.   BELOW ARE SYMPTOMS THAT SHOULD BE REPORTED IMMEDIATELY:  *FEVER GREATER THAN 100.5 F  *CHILLS WITH OR WITHOUT FEVER  NAUSEA AND VOMITING THAT IS NOT CONTROLLED WITH YOUR NAUSEA MEDICATION  *UNUSUAL SHORTNESS OF BREATH  *UNUSUAL BRUISING OR BLEEDING  TENDERNESS IN MOUTH AND THROAT WITH OR WITHOUT PRESENCE OF ULCERS  *URINARY PROBLEMS  *BOWEL PROBLEMS  UNUSUAL RASH Items with * indicate a potential emergency and should be followed up as soon as possible.  One of the nurses will contact you 24 hours after your treatment. Please let the nurse know about any problems that you may have experienced. Feel free to call the clinic you have any questions or concerns. The clinic phone number is [phone].   I have been informed and understand all the instructions given to me. I know to contact the clinic, my physician, or go to the Emergency Department if any problems should occur. I do not have any questions at this time, but understand that I may call the clinic during office hours   should I have any questions or need assistance in obtaining follow up care.    __________________________________________  _____________  __________ Signature of Patient or Authorized Representative            Date                   Time    __________________________________________ Nurse's Signature    

## 2012-03-27 ENCOUNTER — Other Ambulatory Visit: Payer: 59 | Admitting: Lab

## 2012-03-30 ENCOUNTER — Other Ambulatory Visit (HOSPITAL_BASED_OUTPATIENT_CLINIC_OR_DEPARTMENT_OTHER): Payer: 59

## 2012-03-30 ENCOUNTER — Telehealth: Payer: Self-pay | Admitting: Oncology

## 2012-03-30 ENCOUNTER — Encounter: Payer: Self-pay | Admitting: Adult Health

## 2012-03-30 ENCOUNTER — Ambulatory Visit (HOSPITAL_BASED_OUTPATIENT_CLINIC_OR_DEPARTMENT_OTHER): Payer: 59 | Admitting: Adult Health

## 2012-03-30 ENCOUNTER — Ambulatory Visit (HOSPITAL_BASED_OUTPATIENT_CLINIC_OR_DEPARTMENT_OTHER): Payer: 59

## 2012-03-30 VITALS — BP 115/74 | HR 87 | Temp 98.2°F | Resp 20 | Ht 64.0 in | Wt 162.4 lb

## 2012-03-30 DIAGNOSIS — D0590 Unspecified type of carcinoma in situ of unspecified breast: Secondary | ICD-10-CM

## 2012-03-30 DIAGNOSIS — C50419 Malignant neoplasm of upper-outer quadrant of unspecified female breast: Secondary | ICD-10-CM

## 2012-03-30 DIAGNOSIS — R5381 Other malaise: Secondary | ICD-10-CM

## 2012-03-30 DIAGNOSIS — Z5111 Encounter for antineoplastic chemotherapy: Secondary | ICD-10-CM

## 2012-03-30 LAB — COMPREHENSIVE METABOLIC PANEL (CC13)
ALT: 17 U/L (ref 0–55)
Alkaline Phosphatase: 104 U/L (ref 40–150)
Creatinine: 0.8 mg/dL (ref 0.6–1.1)
Sodium: 141 mEq/L (ref 136–145)
Total Bilirubin: 0.2 mg/dL (ref 0.20–1.20)
Total Protein: 6.6 g/dL (ref 6.4–8.3)

## 2012-03-30 LAB — CBC WITH DIFFERENTIAL/PLATELET
BASO%: 0.9 % (ref 0.0–2.0)
EOS%: 1.6 % (ref 0.0–7.0)
Eosinophils Absolute: 0.1 10*3/uL (ref 0.0–0.5)
MCH: 34.5 pg — ABNORMAL HIGH (ref 25.1–34.0)
MCHC: 33 g/dL (ref 31.5–36.0)
MCV: 104.5 fL — ABNORMAL HIGH (ref 79.5–101.0)
MONO%: 7.1 % (ref 0.0–14.0)
NEUT#: 4.4 10*3/uL (ref 1.5–6.5)
RBC: 3.3 10*6/uL — ABNORMAL LOW (ref 3.70–5.45)
RDW: 13.9 % (ref 11.2–14.5)
nRBC: 0 % (ref 0–0)

## 2012-03-30 MED ORDER — HEPARIN SOD (PORK) LOCK FLUSH 100 UNIT/ML IV SOLN
500.0000 [IU] | Freq: Once | INTRAVENOUS | Status: AC | PRN
  Administered 2012-03-30: 500 [IU]
  Filled 2012-03-30: qty 5

## 2012-03-30 MED ORDER — DEXAMETHASONE SODIUM PHOSPHATE 10 MG/ML IJ SOLN
20.0000 mg | Freq: Once | INTRAMUSCULAR | Status: AC
  Administered 2012-03-30: 20 mg via INTRAVENOUS

## 2012-03-30 MED ORDER — FAMOTIDINE IN NACL 20-0.9 MG/50ML-% IV SOLN
20.0000 mg | Freq: Once | INTRAVENOUS | Status: AC
  Administered 2012-03-30: 20 mg via INTRAVENOUS

## 2012-03-30 MED ORDER — PACLITAXEL CHEMO INJECTION 300 MG/50ML
80.0000 mg/m2 | Freq: Once | INTRAVENOUS | Status: AC
  Administered 2012-03-30: 144 mg via INTRAVENOUS
  Filled 2012-03-30: qty 24

## 2012-03-30 MED ORDER — SODIUM CHLORIDE 0.9 % IJ SOLN
10.0000 mL | INTRAMUSCULAR | Status: DC | PRN
  Administered 2012-03-30: 10 mL
  Filled 2012-03-30: qty 10

## 2012-03-30 MED ORDER — LORAZEPAM 0.5 MG PO TABS: 0.5000 mg | ORAL_TABLET | Freq: Three times a day (TID) | ORAL | Status: DC

## 2012-03-30 MED ORDER — SODIUM CHLORIDE 0.9 % IV SOLN
Freq: Once | INTRAVENOUS | Status: AC
  Administered 2012-03-30: 12:00:00 via INTRAVENOUS

## 2012-03-30 MED ORDER — DIPHENHYDRAMINE HCL 50 MG/ML IJ SOLN
50.0000 mg | Freq: Once | INTRAMUSCULAR | Status: AC
  Administered 2012-03-30: 50 mg via INTRAVENOUS

## 2012-03-30 MED ORDER — ONDANSETRON 8 MG/50ML IVPB (CHCC)
8.0000 mg | Freq: Once | INTRAVENOUS | Status: AC
  Administered 2012-03-30: 8 mg via INTRAVENOUS

## 2012-03-30 NOTE — Telephone Encounter (Signed)
Pt aware that she will get a call with the mri appt Called [facility] and dr Derrell Lolling is booked up,theay are leaving a note for Annabelle Harman his nurse to eval his sch and will Call the pt.  They are aware that it needs to be after the mri which is expected by 04/09/12     Thurston Hole

## 2012-03-30 NOTE — Patient Instructions (Addendum)
Doing well.  Proceed with chemotherapy.  We will see you back next week.  Please call us if you have any questions or concerns.    

## 2012-03-30 NOTE — Patient Instructions (Signed)
[facility] Discharge Instructions for Patients Receiving Chemotherapy  Today you received the following chemotherapy agents taxol  To help prevent nausea and vomiting after your treatment, we encourage you to take your nausea medication    If you develop nausea and vomiting that is not controlled by your nausea medication, call the clinic. If it is after clinic hours your family physician or the after hours number for the clinic or go to the Emergency Department.   BELOW ARE SYMPTOMS THAT SHOULD BE REPORTED IMMEDIATELY:  *FEVER GREATER THAN 100.5 F  *CHILLS WITH OR WITHOUT FEVER  NAUSEA AND VOMITING THAT IS NOT CONTROLLED WITH YOUR NAUSEA MEDICATION  *UNUSUAL SHORTNESS OF BREATH  *UNUSUAL BRUISING OR BLEEDING  TENDERNESS IN MOUTH AND THROAT WITH OR WITHOUT PRESENCE OF ULCERS  *URINARY PROBLEMS  *BOWEL PROBLEMS  UNUSUAL RASH Items with * indicate a potential emergency and should be followed up as soon as possible.  The clinic phone number is [phone].   I have been informed and understand all the instructions given to me. I know to contact the clinic, my physician, or go to the Emergency Department if any problems should occur. I do not have any questions at this time, but understand that I may call the clinic during office hours   should I have any questions or need assistance in obtaining follow up care.    __________________________________________  _____________  __________ Signature of Patient or Authorized Representative            Date                   Time    __________________________________________ Nurse's Signature

## 2012-03-30 NOTE — Progress Notes (Signed)
OFFICE PROGRESS NOTE  CC  TULLO,TERESA, MD [address] [place] [address] Dr. Chipper Herb Dr. 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 Essentially patient has bilateral breast cancers.  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   #2 patient has begun FEC 100 starting on 12/08/2011 - 01/18/12. A total of 4 cycles of this chemotherapy is planned and then thereafter she will proceed with Taxol as a single agent for total of 12 weeks.  #3 on 02/02/2012 patient will begin singly agent weekly Taxol for a total of 12 weeks.  CURRENT THERAPY Cycle 9 of Taxol  INTERVAL HISTORY: Mary [name] 52 y.o. female returns for follow-up visit today. She continues to do well with her Taxol. She continues to be fatigued, but otherwise denies numbness, fevers, chills, nausea, vomiting, or any other problems.  Her insomnia has improved by taking an increased amount of Lorazepam at night.  She has a lot of questions now that we are nearing the completion of her chemotherapy about her surgery, radiation therapy, and future plans.      MEDICAL HISTORY: Past Medical History  Diagnosis Date  . Irritable bowel syndrome   . GERD (gastroesophageal reflux disease)     Nexium as needed  . Breast cancer August 2013    bilateral  . Dental crowns present     x 4  . Neutropenia, drug-induced 12/15/2011    ALLERGIES:   has no known allergies.  MEDICATIONS:  Current Outpatient Prescriptions  Medication Sig Dispense Refill  . ALPRAZolam (XANAX) 0.25 MG tablet Take 0.25 mg by mouth 3 (three) times daily as needed.       . Ascorbic Acid (VITAMIN C) 100 MG tablet Take 100 mg by mouth daily.        Marland Kitchen b complex vitamins tablet Take 1  tablet by mouth daily.       . calcium carbonate (OS-CAL) 600 MG TABS Take 600 mg by mouth 2 (two) times daily with a meal.        . cholecalciferol (VITAMIN D) 400 UNITS TABS Take by mouth.      . dexamethasone (DECADRON) 4 MG tablet Take 2 tablets (8 mg total) by mouth 2 (two) times daily with a meal. Take daily starting the day after chemotherapy for 2 days. Take with food.  30 tablet  1  . lidocaine-prilocaine (EMLA) cream Apply topically as needed.  30 g  6  . loperamide (IMODIUM A-D) 2 MG tablet Take 2 mg by mouth 4 (four) times daily as needed.      Marland Kitchen LORazepam (ATIVAN) 0.5 MG tablet Take 1 tablet (0.5 mg total) by mouth every 8 (eight) hours.  120 tablet  0  . Misc. Devices MISC 1 each by Does not apply route daily.  1 each  0  . Multiple Vitamin (MULTIVITAMIN) tablet Take 1 tablet by mouth daily.        Marland Kitchen NEXIUM 40 MG capsule TAKE 1 CAPSULE BY MOUTH DAILY.  30 capsule  6  . ondansetron (ZOFRAN) 8 MG tablet Take 1 tablet (8 mg total) by mouth 2 (two) times daily. Take two times a day starting the day after chemo for 2 days. Then take two times a day as needed  for nausea or vomiting.  30 tablet  1  . prochlorperazine (COMPAZINE) 10 MG tablet Take 1 tablet (10 mg total) by mouth every 6 (six) hours as needed (Nausea or vomiting).  30 tablet  1  . prochlorperazine (COMPAZINE) 25 MG suppository Place 1 suppository (25 mg total) rectally every 12 (twelve) hours as needed for nausea.  12 suppository  3  . vitamin B-12 (CYANOCOBALAMIN) 100 MCG tablet Take 50 mcg by mouth daily.        . ciprofloxacin (CIPRO) 500 MG tablet Take 1 tablet (500 mg total) by mouth 2 (two) times daily.  14 tablet  6    SURGICAL HISTORY:  Past Surgical History  Procedure Date  . Breast lumpectomy 2008    left  . Portacath placement 12/02/2011    Procedure: INSERTION PORT-A-CATH;  Surgeon: Ernestene Mention, MD;  Location: [facility];  Service: General;  Laterality: N/A;  insertion port a cath with  fluoroscopy    REVIEW OF SYSTEMS:   General: fatigue (+), night sweats (-), fever (-), pain (-) Lymph: palpable nodes (-) HEENT: vision changes (-), mucositis (-), gum bleeding (-), epistaxis (-) Cardiovascular: chest pain (-), palpitations (-) Pulmonary: shortness of breath (-), dyspnea on exertion (-), cough (-), hemoptysis (-) GI:  Early satiety (-), melena (-), dysphagia (-), nausea/vomiting (-), diarrhea (-) GU: dysuria (-), hematuria (-), incontinence (-) Musculoskeletal: joint swelling (-), joint pain (-), back pain (-) Neuro: weakness (-), numbness (-), headache (-), confusion (-) Skin: Rash (-), lesions (-), dryness (-) Psych: depression (-), suicidal/homicidal ideation (-), feeling of hopelessness (-)   PHYSICAL EXAMINATION:  BP 115/74  Pulse 87  Temp 98.2 F (36.8 C)  Resp 20  Ht 5\' 4"  (1.626 m)  Wt 162 lb 6.4 oz (73.664 kg)  BMI 27.88 kg/m2 General: Patient is a well appearing female in no acute distress HEENT: PERRLA, sclerae anicteric no conjunctival pallor, MMM Neck: supple, no palpable adenopathy Lungs: clear to auscultation bilaterally, no wheezes, rhonchi, or rales Cardiovascular: regular rate rhythm, S1, S2, no murmurs, rubs or gallops Abdomen: Soft, non-tender, non-distended, normoactive bowel sounds, no HSM Extremities: warm and well perfused, no clubbing, cyanosis, or edema Skin: No lesions or rash Neuro: Non-focal Breast: right breast with retracted nipple, and palpable mass, left breast, no masses or abnormalities, the breast is much softer and the mass is now mobile ECOG PERFORMANCE STATUS: 0 - Asymptomatic   LABORATORY DATA: Lab Results  Component Value Date   WBC 5.8 03/30/2012   HGB 11.4* 03/30/2012   HCT 34.5* 03/30/2012   MCV 104.5* 03/30/2012   PLT 370 03/30/2012      Chemistry      Component Value Date/Time   NA 142 03/23/2012 1029   NA 140 05/20/2011 1204   K 4.1 03/23/2012 1029   K 5.2 05/20/2011 1204   CL 110* 03/23/2012 1029   CL 108  05/20/2011 1204   CO2 23 03/23/2012 1029   CO2 22 05/20/2011 1204   BUN 15.0 03/23/2012 1029   BUN 10 05/20/2011 1204   CREATININE 0.8 03/23/2012 1029   CREATININE 0.82 05/20/2011 1204   CREATININE 0.9 01/18/2011 1202      Component Value Date/Time   CALCIUM 9.4 03/23/2012 1029   CALCIUM 8.6 05/20/2011 1204   ALKPHOS 102 03/23/2012 1029   ALKPHOS 80 05/20/2011 1204   AST 16 03/23/2012 1029   AST 21 05/20/2011 1204   ALT 14 03/23/2012 1029   ALT 29 05/20/2011 1204  BILITOT 0.20 03/23/2012 1029   BILITOT 0.3 05/20/2011 1204       RADIOGRAPHIC STUDIES:  ASSESSMENT: 52 year old female with  #1 T3, N1, 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 Patient is now going to receive neoadjuvant chemotherapy consisting of FEC 100. She will receive dose dense FEC with day 2 Neulasta. She is overall doing well she understands the rationale for neoadjuvant treatment she understands the side effects of her chemotherapy. She also has her antiemetics already at her disposal to take as directed.  #2 patient has now completed 4 cycles of neoadjuvant FEC 100.  #3 she will begin now again neoadjuvant single agent Taxol weekly beginning 02/02/2012.   PLAN:  #1 patient will proceed with cycle 9 of Taxol today. She is continuing to tolerate her treatment well.  I discussed with her the process of navigating through the next few weeks.  I ordered an MRI of the breasts to be done the week of January 20, along with an appt with Dr. Derrell Lolling for after the MRI has been completed.    #2 We will see Ms. Marter back next Friday for her 10th cycle of Taxol.    #3 Her sleep is much improved with the increased Ativan dose.     All questions were answered. The patient knows to call the clinic with any problems, questions or concerns. We can certainly see the patient much sooner if necessary.  I spent 25 minutes counseling the patient face to face. The total time spent in the appointment was 30  minutes.  Cherie Ouch Lyn Hollingshead, NP Medical Oncology [facility] Phone: [phone] 03/30/2012, 11:10 AM

## 2012-04-03 ENCOUNTER — Other Ambulatory Visit: Payer: 59 | Admitting: Lab

## 2012-04-06 ENCOUNTER — Ambulatory Visit (HOSPITAL_BASED_OUTPATIENT_CLINIC_OR_DEPARTMENT_OTHER): Payer: 59 | Admitting: Adult Health

## 2012-04-06 ENCOUNTER — Ambulatory Visit (HOSPITAL_BASED_OUTPATIENT_CLINIC_OR_DEPARTMENT_OTHER): Payer: 59

## 2012-04-06 ENCOUNTER — Encounter: Payer: Self-pay | Admitting: Adult Health

## 2012-04-06 ENCOUNTER — Other Ambulatory Visit (HOSPITAL_BASED_OUTPATIENT_CLINIC_OR_DEPARTMENT_OTHER): Payer: 59 | Admitting: Lab

## 2012-04-06 VITALS — BP 115/77 | HR 78 | Temp 97.6°F | Resp 20 | Ht 64.0 in | Wt 162.2 lb

## 2012-04-06 DIAGNOSIS — C50419 Malignant neoplasm of upper-outer quadrant of unspecified female breast: Secondary | ICD-10-CM

## 2012-04-06 DIAGNOSIS — D0590 Unspecified type of carcinoma in situ of unspecified breast: Secondary | ICD-10-CM

## 2012-04-06 DIAGNOSIS — Z5111 Encounter for antineoplastic chemotherapy: Secondary | ICD-10-CM

## 2012-04-06 DIAGNOSIS — G4700 Insomnia, unspecified: Secondary | ICD-10-CM

## 2012-04-06 LAB — CBC WITH DIFFERENTIAL/PLATELET
BASO%: 0.7 % (ref 0.0–2.0)
Eosinophils Absolute: 0.1 10*3/uL (ref 0.0–0.5)
HCT: 35.9 % (ref 34.8–46.6)
LYMPH%: 14.8 % (ref 14.0–49.7)
MCHC: 32.9 g/dL (ref 31.5–36.0)
MONO#: 0.5 10*3/uL (ref 0.1–0.9)
NEUT#: 4 10*3/uL (ref 1.5–6.5)
NEUT%: 74.8 % (ref 38.4–76.8)
Platelets: 332 10*3/uL (ref 145–400)
RBC: 3.46 10*6/uL — ABNORMAL LOW (ref 3.70–5.45)
WBC: 5.3 10*3/uL (ref 3.9–10.3)
lymph#: 0.8 10*3/uL — ABNORMAL LOW (ref 0.9–3.3)

## 2012-04-06 LAB — COMPREHENSIVE METABOLIC PANEL (CC13)
ALT: 22 U/L (ref 0–55)
Albumin: 3.5 g/dL (ref 3.5–5.0)
CO2: 24 mEq/L (ref 22–29)
Calcium: 9.6 mg/dL (ref 8.4–10.4)
Chloride: 107 mEq/L (ref 98–107)
Glucose: 91 mg/dl (ref 70–99)
Sodium: 141 mEq/L (ref 136–145)
Total Bilirubin: 0.2 mg/dL (ref 0.20–1.20)
Total Protein: 6.5 g/dL (ref 6.4–8.3)

## 2012-04-06 MED ORDER — ZOLPIDEM TARTRATE ER 12.5 MG PO TBCR: 12.5000 mg | EXTENDED_RELEASE_TABLET | Freq: Every evening | ORAL | Status: DC | PRN

## 2012-04-06 MED ORDER — FAMOTIDINE IN NACL 20-0.9 MG/50ML-% IV SOLN
20.0000 mg | Freq: Once | INTRAVENOUS | Status: AC
  Administered 2012-04-06: 20 mg via INTRAVENOUS

## 2012-04-06 MED ORDER — DIPHENHYDRAMINE HCL 50 MG/ML IJ SOLN
50.0000 mg | Freq: Once | INTRAMUSCULAR | Status: AC
  Administered 2012-04-06: 50 mg via INTRAVENOUS

## 2012-04-06 MED ORDER — DEXAMETHASONE SODIUM PHOSPHATE 4 MG/ML IJ SOLN
20.0000 mg | Freq: Once | INTRAMUSCULAR | Status: AC
  Administered 2012-04-06: 20 mg via INTRAVENOUS

## 2012-04-06 MED ORDER — SODIUM CHLORIDE 0.9 % IV SOLN
Freq: Once | INTRAVENOUS | Status: AC
  Administered 2012-04-06: 12:00:00 via INTRAVENOUS

## 2012-04-06 MED ORDER — PACLITAXEL CHEMO INJECTION 300 MG/50ML
80.0000 mg/m2 | Freq: Once | INTRAVENOUS | Status: AC
  Administered 2012-04-06: 144 mg via INTRAVENOUS
  Filled 2012-04-06: qty 24

## 2012-04-06 MED ORDER — ONDANSETRON 8 MG/50ML IVPB (CHCC)
8.0000 mg | Freq: Once | INTRAVENOUS | Status: AC
  Administered 2012-04-06: 8 mg via INTRAVENOUS

## 2012-04-06 MED ORDER — HEPARIN SOD (PORK) LOCK FLUSH 100 UNIT/ML IV SOLN
500.0000 [IU] | Freq: Once | INTRAVENOUS | Status: AC | PRN
  Administered 2012-04-06: 500 [IU]
  Filled 2012-04-06: qty 5

## 2012-04-06 MED ORDER — SODIUM CHLORIDE 0.9 % IJ SOLN
10.0000 mL | INTRAMUSCULAR | Status: DC | PRN
  Administered 2012-04-06: 10 mL
  Filled 2012-04-06: qty 10

## 2012-04-06 NOTE — Patient Instructions (Addendum)
Doing well.  Proceed with chemotherapy.  Please call us if you have any questions or concerns.    

## 2012-04-06 NOTE — Progress Notes (Signed)
OFFICE PROGRESS NOTE  CC  TULLO,TERESA, MD [address] [place] [address] Dr. Chipper Herb Dr. 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 Essentially patient has bilateral breast cancers.  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   #2 patient has begun FEC 100 starting on 12/08/2011 - 01/18/12. A total of 4 cycles of this chemotherapy is planned and then thereafter she will proceed with Taxol as a single agent for total of 12 weeks.  #3 on 02/02/2012 patient will begin singly agent weekly Taxol for a total of 12 weeks.  CURRENT THERAPY Cycle 10 of Taxol  INTERVAL HISTORY: Mary [name] 52 y.o. female returns for follow-up visit today.  She is here for evaluation for cycle 10 of chemotherapy.  She has her MRI of the breasts scheduled for 1/21, appt with Dr. Derrell Lolling on 1/23, and cycle 11 of chemo on 1/24.  She's feeling well.  She continues to experience insomnia and fatigue.  She denies fevers, chills, chest pain, numbness, shortness of breath or any other concerns.  A 10 point ROS is neg.   MEDICAL HISTORY: Past Medical History  Diagnosis Date  . Irritable bowel syndrome   . GERD (gastroesophageal reflux disease)     Nexium as needed  . Breast cancer August 2013    bilateral  . Dental crowns present     x 4  . Neutropenia, drug-induced 12/15/2011    ALLERGIES:   has no known allergies.  MEDICATIONS:  Current Outpatient Prescriptions  Medication Sig Dispense Refill  . ALPRAZolam (XANAX) 0.25 MG tablet Take 0.25 mg by mouth 3 (three) times daily as needed.       . Ascorbic Acid (VITAMIN C) 100 MG tablet Take 100 mg by mouth daily.        Marland Kitchen b complex vitamins tablet Take 1 tablet by mouth daily.         . calcium carbonate (OS-CAL) 600 MG TABS Take 600 mg by mouth 2 (two) times daily with a meal.        . cholecalciferol (VITAMIN D) 400 UNITS TABS Take by mouth.      . dexamethasone (DECADRON) 4 MG tablet Take 2 tablets (8 mg total) by mouth 2 (two) times daily with a meal. Take daily starting the day after chemotherapy for 2 days. Take with food.  30 tablet  1  . lidocaine-prilocaine (EMLA) cream Apply topically as needed.  30 g  6  . loperamide (IMODIUM A-D) 2 MG tablet Take 2 mg by mouth 4 (four) times daily as needed.      Marland Kitchen LORazepam (ATIVAN) 0.5 MG tablet Take 1 tablet (0.5 mg total) by mouth every 8 (eight) hours.  120 tablet  0  . Misc. Devices MISC 1 each by Does not apply route daily.  1 each  0  . Multiple Vitamin (MULTIVITAMIN) tablet Take 1 tablet by mouth daily.        Marland Kitchen NEXIUM 40 MG capsule TAKE 1 CAPSULE BY MOUTH DAILY.  30 capsule  6  . ondansetron (ZOFRAN) 8 MG tablet Take 1 tablet (8 mg total) by mouth 2 (two) times daily. Take two times a day starting the day after chemo for 2 days. Then take two times a  day as needed for nausea or vomiting.  30 tablet  1  . prochlorperazine (COMPAZINE) 10 MG tablet Take 1 tablet (10 mg total) by mouth every 6 (six) hours as needed (Nausea or vomiting).  30 tablet  1  . prochlorperazine (COMPAZINE) 25 MG suppository Place 1 suppository (25 mg total) rectally every 12 (twelve) hours as needed for nausea.  12 suppository  3  . vitamin B-12 (CYANOCOBALAMIN) 100 MCG tablet Take 50 mcg by mouth daily.        . ciprofloxacin (CIPRO) 500 MG tablet Take 1 tablet (500 mg total) by mouth 2 (two) times daily.  14 tablet  6  . zolpidem (AMBIEN CR) 12.5 MG CR tablet Take 1 tablet (12.5 mg total) by mouth at bedtime as needed for sleep.  30 tablet  0    SURGICAL HISTORY:  Past Surgical History  Procedure Date  . Breast lumpectomy 2008    left  . Portacath placement 12/02/2011    Procedure: INSERTION PORT-A-CATH;  Surgeon: Ernestene Mention, MD;   Location: [facility];  Service: General;  Laterality: N/A;  insertion port a cath with fluoroscopy    REVIEW OF SYSTEMS:   General: fatigue (+), night sweats (-), fever (-), pain (-) Lymph: palpable nodes (-) HEENT: vision changes (-), mucositis (-), gum bleeding (-), epistaxis (-) Cardiovascular: chest pain (-), palpitations (-) Pulmonary: shortness of breath (-), dyspnea on exertion (-), cough (-), hemoptysis (-) GI:  Early satiety (-), melena (-), dysphagia (-), nausea/vomiting (-), diarrhea (-) GU: dysuria (-), hematuria (-), incontinence (-) Musculoskeletal: joint swelling (-), joint pain (-), back pain (-) Neuro: weakness (-), numbness (-), headache (-), confusion (-) Skin: Rash (-), lesions (-), dryness (-) Psych: depression (-), suicidal/homicidal ideation (-), feeling of hopelessness (-)   PHYSICAL EXAMINATION:  BP 115/77  Pulse 78  Temp 97.6 F (36.4 C)  Resp 20  Ht 5\' 4"  (1.626 m)  Wt 162 lb 3.2 oz (73.573 kg)  BMI 27.84 kg/m2 General: Patient is a well appearing female in no acute distress HEENT: PERRLA, sclerae anicteric no conjunctival pallor, MMM Neck: supple, no palpable adenopathy Lungs: clear to auscultation bilaterally, no wheezes, rhonchi, or rales Cardiovascular: regular rate rhythm, S1, S2, no murmurs, rubs or gallops Abdomen: Soft, non-tender, non-distended, normoactive bowel sounds, no HSM Extremities: warm and well perfused, no clubbing, cyanosis, or edema Skin: No lesions or rash Neuro: Non-focal Breast: right breast with retracted nipple, and palpable mass, left breast, no masses or abnormalities, the breast is much softer and the mass is now mobile ECOG PERFORMANCE STATUS: 0 - Asymptomatic   LABORATORY DATA: Lab Results  Component Value Date   WBC 5.3 04/06/2012   HGB 11.8 04/06/2012   HCT 35.9 04/06/2012   MCV 103.8* 04/06/2012   PLT 332 04/06/2012      Chemistry      Component Value Date/Time   NA 141 03/30/2012 1006   NA 140  05/20/2011 1204   K 4.0 03/30/2012 1006   K 5.2 05/20/2011 1204   CL 107 03/30/2012 1006   CL 108 05/20/2011 1204   CO2 24 03/30/2012 1006   CO2 22 05/20/2011 1204   BUN 13.0 03/30/2012 1006   BUN 10 05/20/2011 1204   CREATININE 0.8 03/30/2012 1006   CREATININE 0.82 05/20/2011 1204   CREATININE 0.9 01/18/2011 1202      Component Value Date/Time   CALCIUM 9.6 03/30/2012 1006   CALCIUM 8.6 05/20/2011 1204   ALKPHOS 104 03/30/2012 1006  ALKPHOS 80 05/20/2011 1204   AST 14 03/30/2012 1006   AST 21 05/20/2011 1204   ALT 17 03/30/2012 1006   ALT 29 05/20/2011 1204   BILITOT 0.20 03/30/2012 1006   BILITOT 0.3 05/20/2011 1204       RADIOGRAPHIC STUDIES:  ASSESSMENT: 52 year old female with  #1 T3, N1, 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 Patient is now going to receive neoadjuvant chemotherapy consisting of FEC 100. She will receive dose dense FEC with day 2 Neulasta. She is overall doing well she understands the rationale for neoadjuvant treatment she understands the side effects of her chemotherapy. She also has her antiemetics already at her disposal to take as directed.  #2 patient has now completed 4 cycles of neoadjuvant FEC 100.  #3 she will begin now again neoadjuvant single agent Taxol weekly beginning 02/02/2012.  #4 Insomnia  PLAN:  #1 patient will proceed with cycle 10 of Taxol today. She's tolerating her treatment well.    #2 We will see Ms. Ortego back next Friday for her 11th cycle of Taxol.    #3 Her sleep has gotten worse and she'd like a prescription for ambien CR.  I have provided her with one and we will see if it helps next week.     All questions were answered. The patient knows to call the clinic with any problems, questions or concerns. We can certainly see the patient much sooner if necessary.  I spent 25 minutes counseling the patient face to face. The total time spent in the appointment was 30 minutes.  Cherie Ouch Lyn Hollingshead,  NP Medical Oncology [place] Phone: [phone] 04/06/2012, 11:25 AM

## 2012-04-06 NOTE — Patient Instructions (Addendum)
[facility] Discharge Instructions for Patients Receiving Chemotherapy  Today you received the following chemotherapy agents Taxol.  To help prevent nausea and vomiting after your treatment, we encourage you to take your nausea medication as prescribed.   If you develop nausea and vomiting that is not controlled by your nausea medication, call the clinic. If it is after clinic hours your family physician or the after hours number for the clinic or go to the Emergency Department.   BELOW ARE SYMPTOMS THAT SHOULD BE REPORTED IMMEDIATELY:  *FEVER GREATER THAN 100.5 F  *CHILLS WITH OR WITHOUT FEVER  NAUSEA AND VOMITING THAT IS NOT CONTROLLED WITH YOUR NAUSEA MEDICATION  *UNUSUAL SHORTNESS OF BREATH  *UNUSUAL BRUISING OR BLEEDING  TENDERNESS IN MOUTH AND THROAT WITH OR WITHOUT PRESENCE OF ULCERS  *URINARY PROBLEMS  *BOWEL PROBLEMS  UNUSUAL RASH Items with * indicate a potential emergency and should be followed up as soon as possible.  Feel free to call the clinic you have any questions or concerns. The clinic phone number is [phone].   I have been informed and understand all the instructions given to me. I know to contact the clinic, my physician, or go to the Emergency Department if any problems should occur. I do not have any questions at this time, but understand that I may call the clinic during office hours   should I have any questions or need assistance in obtaining follow up care.    __________________________________________  _____________  __________ Signature of Patient or Authorized Representative            Date                   Time    __________________________________________ Nurse's Signature    

## 2012-04-10 ENCOUNTER — Ambulatory Visit (HOSPITAL_COMMUNITY)
Admission: RE | Admit: 2012-04-10 | Discharge: 2012-04-10 | Disposition: A | Discharge status: Home / Self Care | Payer: 59 | Source: Ambulatory Visit | Attending: Adult Health | Admitting: Adult Health

## 2012-04-10 ENCOUNTER — Other Ambulatory Visit: Payer: 59 | Admitting: Lab

## 2012-04-10 ENCOUNTER — Other Ambulatory Visit: Payer: Self-pay | Admitting: Adult Health

## 2012-04-10 DIAGNOSIS — C50419 Malignant neoplasm of upper-outer quadrant of unspecified female breast: Secondary | ICD-10-CM

## 2012-04-10 DIAGNOSIS — D0590 Unspecified type of carcinoma in situ of unspecified breast: Secondary | ICD-10-CM | POA: Insufficient documentation

## 2012-04-10 DIAGNOSIS — K7689 Other specified diseases of liver: Secondary | ICD-10-CM | POA: Insufficient documentation

## 2012-04-10 MED ORDER — GADOBENATE DIMEGLUMINE 529 MG/ML IV SOLN
15.0000 mL | Freq: Once | INTRAVENOUS | Status: AC | PRN
  Administered 2012-04-10: 15 mL via INTRAVENOUS

## 2012-04-12 ENCOUNTER — Encounter (INDEPENDENT_AMBULATORY_CARE_PROVIDER_SITE_OTHER): Payer: Self-pay | Admitting: General Surgery

## 2012-04-12 ENCOUNTER — Telehealth (INDEPENDENT_AMBULATORY_CARE_PROVIDER_SITE_OTHER): Payer: Self-pay | Admitting: General Surgery

## 2012-04-12 ENCOUNTER — Ambulatory Visit (INDEPENDENT_AMBULATORY_CARE_PROVIDER_SITE_OTHER): Payer: Commercial Managed Care - PPO | Admitting: General Surgery

## 2012-04-12 VITALS — BP 132/76 | HR 74 | Temp 97.8°F | Resp 18 | Ht 64.0 in | Wt 162.2 lb

## 2012-04-12 DIAGNOSIS — C50419 Malignant neoplasm of upper-outer quadrant of unspecified female breast: Secondary | ICD-10-CM

## 2012-04-12 DIAGNOSIS — C50919 Malignant neoplasm of unspecified site of unspecified female breast: Secondary | ICD-10-CM

## 2012-04-12 NOTE — Telephone Encounter (Signed)
Called and spoke with Mary [name] to set patient up for appointment to be seen for evaluation of reconstruction after surgery and cancer treatment. Patient set up to see Dr. Odis Luster on 04/23/12 at 1:45. Advised Mary [name] I would print off all of the needed information for Dr. Odis Luster and bring it downstairs as soon as possible (after clinic has ended).

## 2012-04-12 NOTE — Patient Instructions (Signed)
Your MRI and your exam shows a very dramatic, excellent response to chemotherapy.  We have discussed options for surgical management of your bilateral breast cancer, and we have discussed plastic surgery reconstruction issues in general.  You have decided that you want bilateral mastectomies.  You will tentatively be scheduled for bilateral total mastectomy, right axillary lymph node dissection, left axillary sentinel node biopsy, and tentatively, removal of Port-A-Cath. We will ask Dr. Welton Flakes whether she wants the Port-A-Cath removed or not.  In the meantime, you will be referred to plastic surgery for consultation regarding reconstructive options and timing. Since you're going to get radiation therapy on the right side, I suspect they will want to delay reconstruction until after that is done, but we need to see what they say.     Mastectomy, With or Without Reconstruction Mastectomy (removal of the breast) is a procedure most commonly used to treat cancer (tumor) of the breast. Different procedures are available for treatment. This depends on the stage of the tumor (abnormal growths). Discuss this with your caregiver, surgeon (a specialist for performing operations such as this), or oncologist (someone specialized in the treatment of cancer). With proper information, you can decide which treatment is best for you. Although the sound of the word cancer is frightening to all of [place], the new treatments and medications can be a source of reassurance and comfort. If there are things you are worried about, discuss them with your caregiver. He or she can help comfort you and your family. Some of the different procedures for treating breast cancer are:  Radical (extensive) mastectomy. This is an operation used to remove the entire breast, the muscles under the breast, and all of the glands (lymph nodes) under the arm. With all of the new treatments available for cancer of the breast, this procedure has  become less common.  Modified radical mastectomy. This is a similar operation to the radical mastectomy described above. In the modified radical mastectomy, the muscles of the chest wall are not removed unless one of the lessor muscles is removed. One of the lessor muscles may be removed to allow better removal of the lymph nodes. The axillary lymph nodes are also removed. Rarely, during an axillary node dissection nerves to this area are damaged. Radiation therapy is then often used to the area following this surgery.  A total mastectomy also known as a complete or simple mastectomy. It involves removal of only the breast. The lymph nodes and the muscles are left in place.  In a lumpectomy, the lump is removed from the breast. This is the simplest form of surgical treatment. A sentinel lymph node biopsy may also be done. Additional treatment may be required. RISKS AND COMPLICATIONS The main problems that follow removal of the breast include:  Infection (germs start growing in the wound). This can usually be treated with antibiotics (medications that kill germs).  Lymphedema. This means the arm on the side of the breast that was operated on swells because the lymph (tissue fluid) cannot follow the main channels back into the body. This only occurs when the lymph nodes have had to be removed under the arm.  There may be some areas of numbness to the upper arm and around the incision (cut by the surgeon) in the breast. This happens because of the cutting of or damage to some of the nerves in the area. This is most often unavoidable.  There may be difficultymoving the arm in a full range of motion (  moving in all directions) following surgery. This usually improves with time following use and exercise.  Recurrence of breast cancer may happen with the very best of surgery and follow up treatment. Sometimes small cancer cells that cannot be seen with the naked eye have already spread at the time of  surgery. When this happens other treatment is available. This treatment may be radiation, medications or a combination of both. RECONSTRUCTION Reconstruction of the breast may be done immediately if there is not going to be post-operative radiation. This surgery is done for cosmetic (improve appearance) purposes to improve the physical appearance after the operation. This may be done in two ways:  It can be done using a saline filled prosthetic (an artificial breast which is filled with salt water). Silicone breast implants are now re-approved by the FDA and are being commonly used.  Reconstruction can be done using the body's own muscle/fat/skin. Your caregiver will discuss your options with you. Depending upon your needs or choice, together you will be able to determine which procedure is best for you. Document Released: 11/30/2000 Document Revised: 05/30/2011 Document Reviewed: 07/24/2007 [facility] Patient Information 2013 [place], [place].    Total or Modified Radical Mastectomy  Care After Refer to this sheet in the next few weeks. These instructions provide you with information on caring for yourself after your procedure. Your caregiver may also give you more specific instructions. Your treatment has been planned according to current medical practices, but problems sometimes occur. Call your caregiver if you have any problems or questions after your procedure. ACTIVITY  Your caregiver will advise you when you may resume strenuous activities, driving, and sports.  After the drain(s) are removed, you may do light housework. Avoid heavy lifting, carrying, or pushing. You should not be lifting anything heavier than 5 lbs.  Take frequent rest periods. You may tire more easily than usual.  Always rest and elevate the arm affected by your surgery for a period of time equal to your activity time.  Continue doing the exercises given to you by the physical therapist/occupational therapist even  after full range of motion has returned. The amount of time this takes will vary from person to person.  After normal range of motion has returned, some stiffness and soreness may persist for 2-3 months. This is normal and will subside.  Begin sports or strenuous activities in moderation. This will give you a chance to rebuild your endurance. Continue to be cautious of heavy lifting or carrying (no more than 10 lbs.) with your affected arm.  You may return to work as recommended by your caregiver. NUTRITION  You may resume your normal diet.  Make sure you drink plenty of fluids (6-8 glasses a day).  Eat a well-balanced diet. Including daily portions of food from government recommended food groups:  Grains.  Vegetables.  Fruits.  Milk.  Meat & beans.  Oils. Visit [employer] for more information HYGIENE  You may wash your hair.  If your incision (cut from surgery) is closed, you may shower or tub bathe, unless instructed otherwise by your doctor. FEVER  If you feel feverish or have shaking chills, take your temperature. If your temperature is 102 F (38.9 C) or above, call your caregiver. The fever may mean there is an infection.  If you call early, infection can be treated with antibiotics and hospitalization may be avoided. PAIN CONTROL  Mild discomfort may occur.  You may need to take an over-the-counter pain medication or a medication prescribed  by your caregiver.  Call your caregiver if you experience increased pain. INCISION CARE  Check your incision daily for increased redness, drainage, swelling, or separation of skin.  Call your caregiver if any of the above are noted. ARM AND HAND CARE  If the lymph nodes under your arm were removed with a modified radical mastectomy, there may be a greater tendency for the arm to swell.  Try to avoid having blood pressures taken, blood drawn, or injections given in the affected arm. This is the arm on the same  side as the surgery.  Use hand lotion to soften cuticles instead of cutting them to avoid cutting yourself.  Be careful when shaving your under arms. Use an electric shaver if possible. You may use a deodorant after the incision has completely healed. Until then, clean under your arms with hydrogen peroxide.  Use reasonable precaution when cooking, sewing, and gardening to avoid burning or needle or thorn pricks.  Do not weigh your arm straight down with a package or your purse.  Follow the exercises and instructions given to you by the physical therapist/occupational therapist and your caregiver. FOLLOW-UP APPOINTMENT Call your caregiver for a follow-up appointment as directed. PROSTHESIS INFORMATION Wear your temporary prosthesis (artificial breast) until your caregiver gives you permission to purchase a permanent one. This will depend upon your rate of healing. We suggest you also wait until you are physically and emotionally ready to shop for one. The suitability depends on several individual factors. We do not endorse any particular prosthesis, but suggest you try several until you are satisfied with appearance and fit. A list of stores may be obtained from your local [employer] at www.cancer.org or 1-800-ACS-2345 (1-805-169-3378). A permanent prosthesis is medically necessary to restore balance. It is also income tax deductible. Be sure all receipts are marked "surgical". It is not essential to purchase a bra. You may sew a pocket into your regular bra. Note: Remember to take all of your medical insurance information with you when shopping for your prosthesis. SELECTING A PROSTHESIS FITTER You may want to ask the following questions when selecting a fitter:  What styles and brands of forms are carried in stock?  How long have the forms been on the market and have there been any problems with them?  Why would one form be better than another?  How long should a particular  form last?  May I wear the form for a trial period without obligation?  Do the forms require a prosthetic bra? If so, what is the price range? Must I always wear that style?  If alterations to the bra are necessary, can they be done at this location or be sent out?  Will I be charged for alterations?  Will I receive suggestions on how to alter my own wardrobe, if necessary?  Will you special order forms or bras if necessary?  Are fitters always available to meet my needs?  What kinds of garments should be worn for the fitting?  Are lounge wear, swim wear, and accessories available?  If I have insurance coverage or Medicare, will you suggest ways for processing the paper work?  Do you keep complete records so that mail reordering is possible?  How are warranty claims handled if I have a problem with the form? Document Released: 10/29/2003 Document Revised: 05/30/2011 Document Reviewed: 07/03/2007 [facility] Patient Information 2013 [place], [place].

## 2012-04-12 NOTE — Progress Notes (Signed)
Patient ID: Mary [name], female   DOB: 06-23-1960, 52 y.o.   MRN: [id number]  Chief Complaint  Patient presents with  . Breast Cancer    HPI Mary [name] is a 52 y.o. female.  She returns to see me for management of her bilateral breast cancer. She is near the end of her neoadjuvant chemotherapy.  She feels like she has had a significant positive response in her right breast. She says she really cannot feel the mass in the morning and the skin looks normal   She was initially evaluated in the [facility] on 12/14/2011. She had a large mass in the right breast, 8-10 cm, centrally with skin thickening and nipple inversion. She also had calcifications in the upper outer quadrant of the left breast. Biopsy of the right breast showed invasive cancer, probably lobular, receptor positive, HER-2-negative, clinical stage T3, N1. 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 I placed the Port-A-Cath and she has been receiving chemotherapy.  She had positive right axillary lymph node by MRI and by PET CT, but this was never biopsied.  Recent MRI shows dramatic response on the right side with very minimal enhancement and normal lymph nodes.Left side looks normal.  She states that Dr. Welton Flakes is going to give her last chemotherapy on January 31. She states  Dr. Welton Flakes told her she can have her surgery after that is done.  We spent a very long time talking about surgical options on the right and on the left. We went over  plastic surgery issues. Basically she has decided she wants bilateral mastectomy, stating that she does not want to have another issue on the left side. She is interested in reconstruction. She is aware that this may have to be delayed because she is planning to get radiation therapy to the right breast.  We are going to tentatively schedule her for a bilateral mastectomy, right axillary lymph node dissection, left  SLN biopsy and possible Port-A-Cath removal. We're also been referred to plastic surgery preop so they can begin their conversation about plastic surgical options and timing of reconstruction. HPI  Past Medical History  Diagnosis Date  . Irritable bowel syndrome   . GERD (gastroesophageal reflux disease)     Nexium as needed  . Dental crowns present     x 4  . Neutropenia, drug-induced 12/15/2011  . Breast cancer August 2013    bilateral    Past Surgical History  Procedure Date  . Portacath placement 12/02/2011    Procedure: INSERTION PORT-A-CATH;  Surgeon: Ernestene Mention, MD;  Location: [facility];  Service: General;  Laterality: N/A;  insertion port a cath with fluoroscopy  . Breast lumpectomy 2008    left    Family History  Problem Relation Age of Onset  . Hypertension Mother   . Hypertension Maternal Grandmother   . Pancreatic cancer Paternal Grandmother     diagnosed in her 53s  . Ovarian cancer Maternal Aunt     maternal grandmother's sister  . Breast cancer Paternal Aunt     diagnosed in late 82s early 30s  . Parkinsonism Paternal Grandfather     Social History History  Substance Use Topics  . Smoking status: Former Smoker    Quit date: 01/18/1991  . Smokeless tobacco: Never Used  . Alcohol Use: 0.0 oz/week     Comment: occasionally    No Known Allergies  Current  Outpatient Prescriptions  Medication Sig Dispense Refill  . ALPRAZolam (XANAX) 0.25 MG tablet Take 0.25 mg by mouth 3 (three) times daily as needed.       . Ascorbic Acid (VITAMIN C) 100 MG tablet Take 100 mg by mouth daily.        Marland Kitchen b complex vitamins tablet Take 1 tablet by mouth daily.       . calcium carbonate (OS-CAL) 600 MG TABS Take 600 mg by mouth 2 (two) times daily with a meal.        . cholecalciferol (VITAMIN D) 400 UNITS TABS Take by mouth.      . ciprofloxacin (CIPRO) 500 MG tablet Take 1 tablet (500 mg total) by mouth 2 (two) times daily.  14 tablet  6  .  dexamethasone (DECADRON) 4 MG tablet Take 2 tablets (8 mg total) by mouth 2 (two) times daily with a meal. Take daily starting the day after chemotherapy for 2 days. Take with food.  30 tablet  1  . lidocaine-prilocaine (EMLA) cream Apply topically as needed.  30 g  6  . loperamide (IMODIUM A-D) 2 MG tablet Take 2 mg by mouth 4 (four) times daily as needed.      Marland Kitchen LORazepam (ATIVAN) 0.5 MG tablet Take 1 tablet (0.5 mg total) by mouth every 8 (eight) hours.  120 tablet  0  . Misc. Devices MISC 1 each by Does not apply route daily.  1 each  0  . Multiple Vitamin (MULTIVITAMIN) tablet Take 1 tablet by mouth daily.        Marland Kitchen NEXIUM 40 MG capsule TAKE 1 CAPSULE BY MOUTH DAILY.  30 capsule  6  . ondansetron (ZOFRAN) 8 MG tablet Take 1 tablet (8 mg total) by mouth 2 (two) times daily. Take two times a day starting the day after chemo for 2 days. Then take two times a day as needed for nausea or vomiting.  30 tablet  1  . prochlorperazine (COMPAZINE) 10 MG tablet Take 1 tablet (10 mg total) by mouth every 6 (six) hours as needed (Nausea or vomiting).  30 tablet  1  . prochlorperazine (COMPAZINE) 25 MG suppository Place 1 suppository (25 mg total) rectally every 12 (twelve) hours as needed for nausea.  12 suppository  3  . vitamin B-12 (CYANOCOBALAMIN) 100 MCG tablet Take 50 mcg by mouth daily.        Marland Kitchen zolpidem (AMBIEN CR) 12.5 MG CR tablet Take 1 tablet (12.5 mg total) by mouth at bedtime as needed for sleep.  30 tablet  0    Review of Systems Review of Systems  Constitutional: Negative for fever, chills and unexpected weight change.  HENT: Negative for hearing loss, congestion, sore throat, trouble swallowing and voice change.        Alopecia  Eyes: Negative for visual disturbance.  Respiratory: Negative for cough and wheezing.   Cardiovascular: Negative for chest pain, palpitations and leg swelling.  Gastrointestinal: Negative for nausea, vomiting, abdominal pain, diarrhea, constipation, blood in  stool, abdominal distention and anal bleeding.  Genitourinary: Negative for hematuria, vaginal bleeding and difficulty urinating.  Musculoskeletal: Negative for arthralgias.  Skin: Negative for rash and wound.  Neurological: Negative for seizures, syncope and headaches.  Hematological: Negative for adenopathy. Does not bruise/bleed easily.  Psychiatric/Behavioral: Negative for confusion.    Blood pressure 132/76, pulse 74, temperature 97.8 F (36.6 C), temperature source Temporal, resp. rate 18, height 5\' 4"  (1.626 m), weight 162 lb 4 oz (73.596 kg).  Physical Exam Physical Exam  Constitutional: She is oriented to person, place, and time. She appears well-developed and well-nourished. No distress.  HENT:  Head: Normocephalic and atraumatic.  Nose: Nose normal.  Mouth/Throat: No oropharyngeal exudate.  Eyes: Conjunctivae normal and EOM are normal. Pupils are equal, round, and reactive to light. Left eye exhibits no discharge. No scleral icterus.  Neck: Neck supple. No JVD present. No tracheal deviation present. No thyromegaly present.  Cardiovascular: Normal rate, regular rhythm, normal heart sounds and intact distal pulses.   No murmur heard. Pulmonary/Chest: Effort normal and breath sounds normal. No respiratory distress. She has no wheezes. She has no rales. She exhibits no tenderness.       Right breast smaller than left, the skin now looks normal and breast feels normal. Left breast exam unremarkable. No axillary or supraclavicular adenopathy.  Abdominal: Soft. Bowel sounds are normal. She exhibits no distension and no mass. There is no tenderness. There is no rebound and no guarding.  Musculoskeletal: She exhibits no edema and no tenderness.  Lymphadenopathy:    She has no cervical adenopathy.  Neurological: She is alert and oriented to person, place, and time. She exhibits normal muscle tone. Coordination normal.  Skin: Skin is warm. No rash noted. She is not diaphoretic. No  erythema. No pallor.  Psychiatric: She has a normal mood and affect. Her behavior is normal. Judgment and thought content normal.    Data Reviewed Recent MRI. Cancer center nights. Old records.  Assessment    Next invasive and noninvasive cancer right breast, probable lobular phenotype, receptor positive, HER-2/neu negative. Pretreatment clinical stage TIII N1.  High-grade DCIS with comedo necrosis, left breast, upper outer quadrant, clinical stage Tis, N0  Status post neoadjuvant chemotherapy with excellent clinical and radiographic response  Remote history left breast biopsy for fibroadenoma 5 years ago  Hypertension  Irritable bowel syndrome    Plan    She will be tentatively scheduled for bilateral total mastectomy, right axillary lymph node dissection, left axillary sentinel node biopsy, and possible removal of Port-A-Cath.  I discussed the indications, details, techniques, and numerous risks of the surgery with the patient and her husband. They understand all these issues. All their questions are answered . They agree  with this plan.  She will be referred to plastic surgery for preop consultation regarding options and timing of reconstruction. Anticipate they will probably want to delay reconstruction because of the planned radiation therapy to the right chest wall.  If they decide on immediate reconstruction we can always reschedule.       Angelia Mould. Derrell Lolling, M.D., [facility] General and Minimally invasive Surgery Breast and Colorectal Surgery Office:   [phone] Pager:   [phone]  04/12/2012, 10:40 AM

## 2012-04-13 ENCOUNTER — Telehealth: Payer: Self-pay | Admitting: Oncology

## 2012-04-13 ENCOUNTER — Ambulatory Visit (HOSPITAL_BASED_OUTPATIENT_CLINIC_OR_DEPARTMENT_OTHER): Payer: 59

## 2012-04-13 ENCOUNTER — Encounter: Payer: Self-pay | Admitting: Adult Health

## 2012-04-13 ENCOUNTER — Other Ambulatory Visit (HOSPITAL_BASED_OUTPATIENT_CLINIC_OR_DEPARTMENT_OTHER): Payer: 59 | Admitting: Lab

## 2012-04-13 ENCOUNTER — Ambulatory Visit (HOSPITAL_BASED_OUTPATIENT_CLINIC_OR_DEPARTMENT_OTHER): Payer: 59 | Admitting: Adult Health

## 2012-04-13 VITALS — BP 113/75 | HR 78 | Temp 98.2°F | Resp 20 | Ht 64.0 in | Wt 162.6 lb

## 2012-04-13 DIAGNOSIS — D0590 Unspecified type of carcinoma in situ of unspecified breast: Secondary | ICD-10-CM

## 2012-04-13 DIAGNOSIS — C50419 Malignant neoplasm of upper-outer quadrant of unspecified female breast: Secondary | ICD-10-CM

## 2012-04-13 DIAGNOSIS — Z5111 Encounter for antineoplastic chemotherapy: Secondary | ICD-10-CM

## 2012-04-13 LAB — CBC WITH DIFFERENTIAL/PLATELET
BASO%: 1.1 % (ref 0.0–2.0)
Basophils Absolute: 0.1 10*3/uL (ref 0.0–0.1)
EOS%: 1.8 % (ref 0.0–7.0)
HGB: 12.1 g/dL (ref 11.6–15.9)
MCH: 34.1 pg — ABNORMAL HIGH (ref 25.1–34.0)
MCHC: 33 g/dL (ref 31.5–36.0)
MCV: 103.4 fL — ABNORMAL HIGH (ref 79.5–101.0)
MONO%: 6.6 % (ref 0.0–14.0)
NEUT%: 74 % (ref 38.4–76.8)
RDW: 13.5 % (ref 11.2–14.5)

## 2012-04-13 LAB — COMPREHENSIVE METABOLIC PANEL (CC13)
AST: 14 U/L (ref 5–34)
Alkaline Phosphatase: 94 U/L (ref 40–150)
BUN: 12.3 mg/dL (ref 7.0–26.0)
Glucose: 97 mg/dl (ref 70–99)
Sodium: 142 mEq/L (ref 136–145)
Total Bilirubin: <0.2 mg/dL (ref 0.20–1.20)

## 2012-04-13 MED ORDER — HEPARIN SOD (PORK) LOCK FLUSH 100 UNIT/ML IV SOLN
500.0000 [IU] | Freq: Once | INTRAVENOUS | Status: AC | PRN
  Administered 2012-04-13: 500 [IU]
  Filled 2012-04-13: qty 5

## 2012-04-13 MED ORDER — ONDANSETRON 8 MG/50ML IVPB (CHCC)
8.0000 mg | Freq: Once | INTRAVENOUS | Status: AC
  Administered 2012-04-13: 8 mg via INTRAVENOUS

## 2012-04-13 MED ORDER — DEXAMETHASONE SODIUM PHOSPHATE 4 MG/ML IJ SOLN
20.0000 mg | Freq: Once | INTRAMUSCULAR | Status: AC
  Administered 2012-04-13: 20 mg via INTRAVENOUS

## 2012-04-13 MED ORDER — SODIUM CHLORIDE 0.9 % IJ SOLN
10.0000 mL | INTRAMUSCULAR | Status: DC | PRN
  Administered 2012-04-13: 10 mL
  Filled 2012-04-13: qty 10

## 2012-04-13 MED ORDER — SODIUM CHLORIDE 0.9 % IV SOLN
Freq: Once | INTRAVENOUS | Status: AC
  Administered 2012-04-13: 11:00:00 via INTRAVENOUS

## 2012-04-13 MED ORDER — PACLITAXEL CHEMO INJECTION 300 MG/50ML
80.0000 mg/m2 | Freq: Once | INTRAVENOUS | Status: AC
  Administered 2012-04-13: 144 mg via INTRAVENOUS
  Filled 2012-04-13: qty 24

## 2012-04-13 MED ORDER — FAMOTIDINE IN NACL 20-0.9 MG/50ML-% IV SOLN
20.0000 mg | Freq: Once | INTRAVENOUS | Status: AC
  Administered 2012-04-13: 20 mg via INTRAVENOUS

## 2012-04-13 MED ORDER — DIPHENHYDRAMINE HCL 50 MG/ML IJ SOLN
50.0000 mg | Freq: Once | INTRAMUSCULAR | Status: AC
  Administered 2012-04-13: 50 mg via INTRAVENOUS

## 2012-04-13 NOTE — Telephone Encounter (Signed)
gv and printed appt schedule for pt for Feb and March..the patient awre

## 2012-04-13 NOTE — Patient Instructions (Signed)
Doing well.  Proceed with chemotherapy.  Please call us if you have any questions or concerns.    

## 2012-04-13 NOTE — Patient Instructions (Addendum)
[facility] Discharge Instructions for Patients Receiving Chemotherapy  Today you received the following chemotherapy agents taxol  To help prevent nausea and vomiting after your treatment, we encourage you to take your nausea medication if needed Begin taking it at 530 pm and take it as often as prescribed if needed for nausea.   If you develop nausea and vomiting that is not controlled by your nausea medication, call the clinic. If it is after clinic hours your family physician or the after hours number for the clinic or go to the Emergency Department.   BELOW ARE SYMPTOMS THAT SHOULD BE REPORTED IMMEDIATELY:  *FEVER GREATER THAN 100.5 F  *CHILLS WITH OR WITHOUT FEVER  NAUSEA AND VOMITING THAT IS NOT CONTROLLED WITH YOUR NAUSEA MEDICATION  *UNUSUAL SHORTNESS OF BREATH  *UNUSUAL BRUISING OR BLEEDING  TENDERNESS IN MOUTH AND THROAT WITH OR WITHOUT PRESENCE OF ULCERS  *URINARY PROBLEMS  *BOWEL PROBLEMS  UNUSUAL RASH Items with * indicate a potential emergency and should be followed up as soon as possible.  Feel free to call the clinic you have any questions or concerns. The clinic phone number is [phone].   I have been informed and understand all the instructions given to me. I know to contact the clinic, my physician, or go to the Emergency Department if any problems should occur. I do not have any questions at this time, but understand that I may call the clinic during office hours   should I have any questions or need assistance in obtaining follow up care.    __________________________________________  _____________  __________ Signature of Patient or Authorized Representative            Date                   Time    __________________________________________ Nurse's Signature

## 2012-04-13 NOTE — Progress Notes (Signed)
OFFICE PROGRESS NOTE  CC  TULLO,TERESA, MD [address] [place] [address] Dr. Chipper Herb Dr. 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 Essentially patient has bilateral breast cancers.  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   #2 patient has begun FEC 100 starting on 12/08/2011 - 01/18/12. A total of 4 cycles of this chemotherapy is planned and then thereafter she will proceed with Taxol as a single agent for total of 12 weeks.  #3 on 02/02/2012 patient will begin singly agent weekly Taxol for a total of 12 weeks.  CURRENT THERAPY Cycle 11 of Taxol  INTERVAL HISTORY: Mary [name] 52 y.o. female returns for follow-up visit today.  She is here for evaluation for cycle 11 of chemotherapy.  The MRI of her breasts showed a dramatic response to her neoadjuvant chemotherapy.  She saw Dr. Derrell Lolling yesterday and has a bilateral mastectomy set up on 2/28.  She's tolerating the treatment well and denies fevers, chills, numbness, tingling or any other concerns.     MEDICAL HISTORY: Past Medical History  Diagnosis Date  . Irritable bowel syndrome   . GERD (gastroesophageal reflux disease)     Nexium as needed  . Dental crowns present     x 4  . Neutropenia, drug-induced 12/15/2011  . Breast cancer August 2013    bilateral    ALLERGIES:   has no known allergies.  MEDICATIONS:  Current Outpatient Prescriptions  Medication Sig Dispense Refill  . ALPRAZolam (XANAX) 0.25 MG tablet Take 0.25 mg by mouth 3 (three) times daily as needed.       . Ascorbic Acid (VITAMIN C) 100 MG tablet Take 100 mg by mouth daily.        Marland Kitchen b complex vitamins tablet Take 1 tablet by mouth daily.       . calcium carbonate (OS-CAL)  600 MG TABS Take 600 mg by mouth 2 (two) times daily with a meal.        . cholecalciferol (VITAMIN D) 400 UNITS TABS Take by mouth.      . dexamethasone (DECADRON) 4 MG tablet Take 2 tablets (8 mg total) by mouth 2 (two) times daily with a meal. Take daily starting the day after chemotherapy for 2 days. Take with food.  30 tablet  1  . lidocaine-prilocaine (EMLA) cream Apply topically as needed.  30 g  6  . loperamide (IMODIUM A-D) 2 MG tablet Take 2 mg by mouth 4 (four) times daily as needed.      Marland Kitchen LORazepam (ATIVAN) 0.5 MG tablet Take 1 tablet (0.5 mg total) by mouth every 8 (eight) hours.  120 tablet  0  . Misc. Devices MISC 1 each by Does not apply route daily.  1 each  0  . Multiple Vitamin (MULTIVITAMIN) tablet Take 1 tablet by mouth daily.        Marland Kitchen NEXIUM 40 MG capsule TAKE 1 CAPSULE BY MOUTH DAILY.  30 capsule  6  . ondansetron (ZOFRAN) 8 MG tablet Take 1 tablet (8 mg total) by mouth 2 (two) times daily. Take two times a day starting the day after chemo for 2 days. Then take two times a day as needed for nausea or vomiting.  30 tablet  1  .  prochlorperazine (COMPAZINE) 10 MG tablet Take 1 tablet (10 mg total) by mouth every 6 (six) hours as needed (Nausea or vomiting).  30 tablet  1  . prochlorperazine (COMPAZINE) 25 MG suppository Place 1 suppository (25 mg total) rectally every 12 (twelve) hours as needed for nausea.  12 suppository  3  . vitamin B-12 (CYANOCOBALAMIN) 100 MCG tablet Take 50 mcg by mouth daily.        . ciprofloxacin (CIPRO) 500 MG tablet Take 1 tablet (500 mg total) by mouth 2 (two) times daily.  14 tablet  6  . zolpidem (AMBIEN CR) 12.5 MG CR tablet Take 1 tablet (12.5 mg total) by mouth at bedtime as needed for sleep.  30 tablet  0    SURGICAL HISTORY:  Past Surgical History  Procedure Date  . Portacath placement 12/02/2011    Procedure: INSERTION PORT-A-CATH;  Surgeon: Ernestene Mention, MD;  Location: [facility];  Service: General;  Laterality: N/A;   insertion port a cath with fluoroscopy  . Breast lumpectomy 2008    left    REVIEW OF SYSTEMS:   General: fatigue (+), night sweats (-), fever (-), pain (-) Lymph: palpable nodes (-) HEENT: vision changes (-), mucositis (-), gum bleeding (-), epistaxis (-) Cardiovascular: chest pain (-), palpitations (-) Pulmonary: shortness of breath (-), dyspnea on exertion (-), cough (-), hemoptysis (-) GI:  Early satiety (-), melena (-), dysphagia (-), nausea/vomiting (-), diarrhea (-) GU: dysuria (-), hematuria (-), incontinence (-) Musculoskeletal: joint swelling (-), joint pain (-), back pain (-) Neuro: weakness (-), numbness (-), headache (-), confusion (-) Skin: Rash (-), lesions (-), dryness (-) Psych: depression (-), suicidal/homicidal ideation (-), feeling of hopelessness (-)   PHYSICAL EXAMINATION:  BP 113/75  Pulse 78  Temp 98.2 F (36.8 C) (Oral)  Resp 20  Ht 5\' 4"  (1.626 m)  Wt 162 lb 9.6 oz (73.755 kg)  BMI 27.91 kg/m2 General: Patient is a well appearing female in no acute distress HEENT: PERRLA, sclerae anicteric no conjunctival pallor, MMM Neck: supple, no palpable adenopathy Lungs: clear to auscultation bilaterally, no wheezes, rhonchi, or rales Cardiovascular: regular rate rhythm, S1, S2, no murmurs, rubs or gallops Abdomen: Soft, non-tender, non-distended, normoactive bowel sounds, no HSM Extremities: warm and well perfused, no clubbing, cyanosis, or edema Skin: No lesions or rash Neuro: Non-focal Breast: right breast with retracted nipple, and palpable mass, left breast, no masses or abnormalities, the breast is much softer and the mass is now mobile ECOG PERFORMANCE STATUS: 0 - Asymptomatic   LABORATORY DATA: Lab Results  Component Value Date   WBC 5.6 04/13/2012   HGB 12.1 04/13/2012   HCT 36.7 04/13/2012   MCV 103.4* 04/13/2012   PLT 341 04/13/2012      Chemistry      Component Value Date/Time   NA 141 04/06/2012 1018   NA 140 05/20/2011 1204   K 4.2  04/06/2012 1018   K 5.2 05/20/2011 1204   CL 107 04/06/2012 1018   CL 108 05/20/2011 1204   CO2 24 04/06/2012 1018   CO2 22 05/20/2011 1204   BUN 15.0 04/06/2012 1018   BUN 10 05/20/2011 1204   CREATININE 0.8 04/06/2012 1018   CREATININE 0.82 05/20/2011 1204   CREATININE 0.9 01/18/2011 1202      Component Value Date/Time   CALCIUM 9.6 04/06/2012 1018   CALCIUM 8.6 05/20/2011 1204   ALKPHOS 92 04/06/2012 1018   ALKPHOS 80 05/20/2011 1204   AST 17 04/06/2012 1018  AST 21 05/20/2011 1204   ALT 22 04/06/2012 1018   ALT 29 05/20/2011 1204   BILITOT 0.20 04/06/2012 1018   BILITOT 0.3 05/20/2011 1204       RADIOGRAPHIC STUDIES:  ASSESSMENT: 52 year old female with  #1 T3, N1, 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 Patient is now going to receive neoadjuvant chemotherapy consisting of FEC 100. She will receive dose dense FEC with day 2 Neulasta. She is overall doing well she understands the rationale for neoadjuvant treatment she understands the side effects of her chemotherapy. She also has her antiemetics already at her disposal to take as directed.  #2 patient has now completed 4 cycles of neoadjuvant FEC 100.  #3 she will begin now again neoadjuvant single agent Taxol weekly beginning 02/02/2012.    PLAN:  #1 patient will proceed with cycle 11 of Taxol today. She's tolerating her treatment well.    #2 We will see Ms. Constantino back next Friday for her 12th cycle of Taxol.  I also set up an appt on 2/5 for labs and f/u before surgery and in the beginning of march with Dr. Welton Flakes after her surgery.  We will refer her to Dr. Dayton Scrape at her 2/5 appt.    All questions were answered. The patient knows to call the clinic with any problems, questions or concerns. We can certainly see the patient much sooner if necessary.  I spent 25 minutes counseling the patient face to face. The total time spent in the appointment was 30 minutes.  Cherie Ouch Lyn Hollingshead, NP Medical  Oncology [facility] Phone: [phone] 04/13/2012, 10:53 AM

## 2012-04-17 ENCOUNTER — Other Ambulatory Visit: Payer: 59 | Admitting: Lab

## 2012-04-20 ENCOUNTER — Ambulatory Visit (HOSPITAL_BASED_OUTPATIENT_CLINIC_OR_DEPARTMENT_OTHER): Payer: 59 | Admitting: Adult Health

## 2012-04-20 ENCOUNTER — Ambulatory Visit (HOSPITAL_BASED_OUTPATIENT_CLINIC_OR_DEPARTMENT_OTHER): Payer: 59

## 2012-04-20 ENCOUNTER — Encounter: Payer: Self-pay | Admitting: Adult Health

## 2012-04-20 ENCOUNTER — Other Ambulatory Visit (HOSPITAL_BASED_OUTPATIENT_CLINIC_OR_DEPARTMENT_OTHER): Payer: 59 | Admitting: Lab

## 2012-04-20 VITALS — BP 124/82 | HR 79 | Temp 98.3°F | Resp 20 | Ht 64.0 in | Wt 164.2 lb

## 2012-04-20 DIAGNOSIS — D0590 Unspecified type of carcinoma in situ of unspecified breast: Secondary | ICD-10-CM

## 2012-04-20 DIAGNOSIS — Z5111 Encounter for antineoplastic chemotherapy: Secondary | ICD-10-CM

## 2012-04-20 DIAGNOSIS — C50419 Malignant neoplasm of upper-outer quadrant of unspecified female breast: Secondary | ICD-10-CM

## 2012-04-20 LAB — COMPREHENSIVE METABOLIC PANEL (CC13)
BUN: 14.2 mg/dL (ref 7.0–26.0)
CO2: 21 mEq/L — ABNORMAL LOW (ref 22–29)
Creatinine: 0.8 mg/dL (ref 0.6–1.1)
Glucose: 95 mg/dl (ref 70–99)
Total Bilirubin: <0.2 mg/dL (ref 0.20–1.20)
Total Protein: 6.5 g/dL (ref 6.4–8.3)

## 2012-04-20 LAB — CBC WITH DIFFERENTIAL/PLATELET
Basophils Absolute: 0 10*3/uL (ref 0.0–0.1)
Eosinophils Absolute: 0.1 10*3/uL (ref 0.0–0.5)
HGB: 11.7 g/dL (ref 11.6–15.9)
LYMPH%: 13.4 % — ABNORMAL LOW (ref 14.0–49.7)
MCV: 103.8 fL — ABNORMAL HIGH (ref 79.5–101.0)
MONO%: 8.1 % (ref 0.0–14.0)
NEUT#: 4.3 10*3/uL (ref 1.5–6.5)
NEUT%: 76.4 % (ref 38.4–76.8)
Platelets: 296 10*3/uL (ref 145–400)

## 2012-04-20 MED ORDER — DIPHENHYDRAMINE HCL 50 MG/ML IJ SOLN
50.0000 mg | Freq: Once | INTRAMUSCULAR | Status: AC
  Administered 2012-04-20: 50 mg via INTRAVENOUS

## 2012-04-20 MED ORDER — HEPARIN SOD (PORK) LOCK FLUSH 100 UNIT/ML IV SOLN
500.0000 [IU] | Freq: Once | INTRAVENOUS | Status: AC | PRN
  Administered 2012-04-20: 500 [IU]
  Filled 2012-04-20: qty 5

## 2012-04-20 MED ORDER — PACLITAXEL CHEMO INJECTION 300 MG/50ML
80.0000 mg/m2 | Freq: Once | INTRAVENOUS | Status: AC
  Administered 2012-04-20: 144 mg via INTRAVENOUS
  Filled 2012-04-20: qty 24

## 2012-04-20 MED ORDER — SODIUM CHLORIDE 0.9 % IV SOLN
Freq: Once | INTRAVENOUS | Status: AC
  Administered 2012-04-20: 12:00:00 via INTRAVENOUS

## 2012-04-20 MED ORDER — FAMOTIDINE IN NACL 20-0.9 MG/50ML-% IV SOLN
20.0000 mg | Freq: Once | INTRAVENOUS | Status: AC
  Administered 2012-04-20: 20 mg via INTRAVENOUS

## 2012-04-20 MED ORDER — DEXAMETHASONE SODIUM PHOSPHATE 4 MG/ML IJ SOLN
20.0000 mg | Freq: Once | INTRAMUSCULAR | Status: AC
  Administered 2012-04-20: 20 mg via INTRAVENOUS

## 2012-04-20 MED ORDER — ONDANSETRON 8 MG/50ML IVPB (CHCC)
8.0000 mg | Freq: Once | INTRAVENOUS | Status: AC
  Administered 2012-04-20: 8 mg via INTRAVENOUS

## 2012-04-20 MED ORDER — SODIUM CHLORIDE 0.9 % IJ SOLN
10.0000 mL | INTRAMUSCULAR | Status: DC | PRN
  Administered 2012-04-20: 10 mL
  Filled 2012-04-20: qty 10

## 2012-04-20 NOTE — Progress Notes (Signed)
OFFICE PROGRESS NOTE  CC  TULLO,TERESA, MD [address] [place] [address] Dr. Chipper Herb Dr. 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 Essentially patient has bilateral breast cancers.  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   #2 patient has begun FEC 100 starting on 12/08/2011 - 01/18/12. A total of 4 cycles of this chemotherapy is planned and then thereafter she will proceed with Taxol as a single agent for total of 12 weeks.  #3 on 02/02/2012 patient will begin singly agent weekly Taxol for a total of 12 weeks.  CURRENT THERAPY Cycle 12 of Taxol  INTERVAL HISTORY: Mary [name] 52 y.o. female returns for follow-up visit today.  She is here for evaluation for cycle 12 of chemotherapy. She's doing well today.  She denies fevers, chills, numbness,or any other concerns.  Otherwise a 10 point ROS is neg.     MEDICAL HISTORY: Past Medical History  Diagnosis Date  . Irritable bowel syndrome   . GERD (gastroesophageal reflux disease)     Nexium as needed  . Dental crowns present     x 4  . Neutropenia, drug-induced 12/15/2011  . Breast cancer August 2013    bilateral    ALLERGIES:   has no known allergies.  MEDICATIONS:  Current Outpatient Prescriptions  Medication Sig Dispense Refill  . ALPRAZolam (XANAX) 0.25 MG tablet Take 0.25 mg by mouth 3 (three) times daily as needed.       . Ascorbic Acid (VITAMIN C) 100 MG tablet Take 100 mg by mouth daily.        Marland Kitchen b complex vitamins tablet Take 1 tablet by mouth daily.       . calcium carbonate (OS-CAL) 600 MG TABS Take 600 mg by mouth 2 (two) times daily with a meal.        . cholecalciferol (VITAMIN D) 400 UNITS TABS Take by mouth.      .  dexamethasone (DECADRON) 4 MG tablet Take 2 tablets (8 mg total) by mouth 2 (two) times daily with a meal. Take daily starting the day after chemotherapy for 2 days. Take with food.  30 tablet  1  . lidocaine-prilocaine (EMLA) cream Apply topically as needed.  30 g  6  . loperamide (IMODIUM A-D) 2 MG tablet Take 2 mg by mouth 4 (four) times daily as needed.      Marland Kitchen LORazepam (ATIVAN) 0.5 MG tablet Take 1 tablet (0.5 mg total) by mouth every 8 (eight) hours.  120 tablet  0  . Misc. Devices MISC 1 each by Does not apply route daily.  1 each  0  . Multiple Vitamin (MULTIVITAMIN) tablet Take 1 tablet by mouth daily.        Marland Kitchen NEXIUM 40 MG capsule TAKE 1 CAPSULE BY MOUTH DAILY.  30 capsule  6  . vitamin B-12 (CYANOCOBALAMIN) 100 MCG tablet Take 50 mcg by mouth daily.        Marland Kitchen zolpidem (AMBIEN CR) 12.5 MG CR tablet Take 1 tablet (12.5 mg total) by mouth at bedtime as needed for sleep.  30 tablet  0  . ciprofloxacin (CIPRO) 500 MG tablet Take 1 tablet (500 mg total) by mouth 2 (two) times daily.  14 tablet  6  .  ondansetron (ZOFRAN) 8 MG tablet Take 1 tablet (8 mg total) by mouth 2 (two) times daily. Take two times a day starting the day after chemo for 2 days. Then take two times a day as needed for nausea or vomiting.  30 tablet  1  . prochlorperazine (COMPAZINE) 10 MG tablet Take 1 tablet (10 mg total) by mouth every 6 (six) hours as needed (Nausea or vomiting).  30 tablet  1  . prochlorperazine (COMPAZINE) 25 MG suppository Place 1 suppository (25 mg total) rectally every 12 (twelve) hours as needed for nausea.  12 suppository  3   No current facility-administered medications for this visit.   Facility-Administered Medications Ordered in Other Visits  Medication Dose Route Frequency Provider Last Rate Last Dose  . famotidine (PEPCID) IVPB 20 mg  20 mg Intravenous Once Victorino December, MD      . ondansetron (ZOFRAN) IVPB 8 mg  8 mg Intravenous Once Victorino December, MD      . sodium chloride 0.9 %  injection 10 mL  10 mL Intracatheter PRN Victorino December, MD   10 mL at 04/20/12 1341    SURGICAL HISTORY:  Past Surgical History  Procedure Date  . Portacath placement 12/02/2011    Procedure: INSERTION PORT-A-CATH;  Surgeon: Ernestene Mention, MD;  Location: [facility];  Service: General;  Laterality: N/A;  insertion port a cath with fluoroscopy  . Breast lumpectomy 2008    left    REVIEW OF SYSTEMS:   General: fatigue (+), night sweats (-), fever (-), pain (-) Lymph: palpable nodes (-) HEENT: vision changes (-), mucositis (-), gum bleeding (-), epistaxis (-) Cardiovascular: chest pain (-), palpitations (-) Pulmonary: shortness of breath (-), dyspnea on exertion (-), cough (-), hemoptysis (-) GI:  Early satiety (-), melena (-), dysphagia (-), nausea/vomiting (-), diarrhea (-) GU: dysuria (-), hematuria (-), incontinence (-) Musculoskeletal: joint swelling (-), joint pain (-), back pain (-) Neuro: weakness (-), numbness (-), headache (-), confusion (-) Skin: Rash (-), lesions (-), dryness (-) Psych: depression (-), suicidal/homicidal ideation (-), feeling of hopelessness (-)   PHYSICAL EXAMINATION:  BP 124/82  Pulse 79  Temp 98.3 F (36.8 C) (Oral)  Resp 20  Ht 5\' 4"  (1.626 m)  Wt 164 lb 3.2 oz (74.481 kg)  BMI 28.19 kg/m2 General: Patient is a well appearing female in no acute distress HEENT: PERRLA, sclerae anicteric no conjunctival pallor, MMM Neck: supple, no palpable adenopathy Lungs: clear to auscultation bilaterally, no wheezes, rhonchi, or rales Cardiovascular: regular rate rhythm, S1, S2, no murmurs, rubs or gallops Abdomen: Soft, non-tender, non-distended, normoactive bowel sounds, no HSM Extremities: warm and well perfused, no clubbing, cyanosis, or edema Skin: No lesions or rash Neuro: Non-focal Breast: right breast with retracted nipple, and palpable mass, left breast, no masses or abnormalities, the breast is much softer and the mass is now  mobile ECOG PERFORMANCE STATUS: 0 - Asymptomatic   LABORATORY DATA: Lab Results  Component Value Date   WBC 5.6 04/20/2012   HGB 11.7 04/20/2012   HCT 35.9 04/20/2012   MCV 103.8* 04/20/2012   PLT 296 04/20/2012      Chemistry      Component Value Date/Time   NA 142 04/13/2012 1007   NA 140 05/20/2011 1204   K 3.9 04/13/2012 1007   K 5.2 05/20/2011 1204   CL 107 04/13/2012 1007   CL 108 05/20/2011 1204   CO2 25 04/13/2012 1007   CO2 22 05/20/2011 1204  BUN 12.3 04/13/2012 1007   BUN 10 05/20/2011 1204   CREATININE 0.8 04/13/2012 1007   CREATININE 0.82 05/20/2011 1204   CREATININE 0.9 01/18/2011 1202      Component Value Date/Time   CALCIUM 9.7 04/13/2012 1007   CALCIUM 8.6 05/20/2011 1204   ALKPHOS 94 04/13/2012 1007   ALKPHOS 80 05/20/2011 1204   AST 14 04/13/2012 1007   AST 21 05/20/2011 1204   ALT 21 04/13/2012 1007   ALT 29 05/20/2011 1204   BILITOT <0.20 Repeated and Verified 04/13/2012 1007   BILITOT 0.3 05/20/2011 1204       RADIOGRAPHIC STUDIES:  ASSESSMENT: 52 year old female with  #1 T3, N1, 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 Patient is now going to receive neoadjuvant chemotherapy consisting of FEC 100. She will receive dose dense FEC with day 2 Neulasta. She is overall doing well she understands the rationale for neoadjuvant treatment she understands the side effects of her chemotherapy. She also has her antiemetics already at her disposal to take as directed.  #2 patient has now completed 4 cycles of neoadjuvant FEC 100.  #3 she will begin now again neoadjuvant single agent Taxol weekly beginning 02/02/2012.  She completed this on 04/20/12.    PLAN:  #1 patient will proceed with cycle 12 of Taxol today. She's tolerated her treatment well.  We spent a long time discussing future steps, and treatment.    #2 We will see Ms. Klich back next Friday for labs.  We will refer her back to Dr. Dayton Scrape at this time.    All questions were  answered. The patient knows to call the clinic with any problems, questions or concerns. We can certainly see the patient much sooner if necessary.  I spent 25 minutes counseling the patient face to face. The total time spent in the appointment was 30 minutes.  Cherie Ouch Lyn Hollingshead, NP Medical Oncology [facility] Phone: [phone] 04/20/2012, 2:12 PM

## 2012-04-20 NOTE — Patient Instructions (Addendum)
Doing well.  Proceed with last dose of Taxol.  I will see you back next week.  Please call us if you have any questions or concerns.

## 2012-04-20 NOTE — Patient Instructions (Signed)
[facility] Discharge Instructions for Patients Receiving Chemotherapy  Today you received the following chemotherapy agents Taxol  To help prevent nausea and vomiting after your treatment, we encourage you to take your nausea medication as directed.   If you develop nausea and vomiting that is not controlled by your nausea medication, call the clinic. If it is after clinic hours your family physician or the after hours number for the clinic or go to the Emergency Department.   BELOW ARE SYMPTOMS THAT SHOULD BE REPORTED IMMEDIATELY:  *FEVER GREATER THAN 100.5 F  *CHILLS WITH OR WITHOUT FEVER  NAUSEA AND VOMITING THAT IS NOT CONTROLLED WITH YOUR NAUSEA MEDICATION  *UNUSUAL SHORTNESS OF BREATH  *UNUSUAL BRUISING OR BLEEDING  TENDERNESS IN MOUTH AND THROAT WITH OR WITHOUT PRESENCE OF ULCERS  *URINARY PROBLEMS  *BOWEL PROBLEMS  UNUSUAL RASH Items with * indicate a potential emergency and should be followed up as soon as possible.  One of the nurses will contact you 24 hours after your treatment. Please let the nurse know about any problems that you may have experienced. Feel free to call the clinic you have any questions or concerns. The clinic phone number is [phone].   I have been informed and understand all the instructions given to me. I know to contact the clinic, my physician, or go to the Emergency Department if any problems should occur. I do not have any questions at this time, but understand that I may call the clinic during office hours   should I have any questions or need assistance in obtaining follow up care.    __________________________________________  _____________  __________ Signature of Patient or Authorized Representative            Date                   Time    __________________________________________ Nurse's Signature    

## 2012-04-24 ENCOUNTER — Other Ambulatory Visit: Payer: 59 | Admitting: Lab

## 2012-04-25 ENCOUNTER — Other Ambulatory Visit (HOSPITAL_BASED_OUTPATIENT_CLINIC_OR_DEPARTMENT_OTHER): Payer: 59 | Admitting: Lab

## 2012-04-25 ENCOUNTER — Encounter: Payer: Self-pay | Admitting: Adult Health

## 2012-04-25 ENCOUNTER — Telehealth: Payer: Self-pay | Admitting: Oncology

## 2012-04-25 ENCOUNTER — Ambulatory Visit (HOSPITAL_BASED_OUTPATIENT_CLINIC_OR_DEPARTMENT_OTHER): Payer: 59 | Admitting: Adult Health

## 2012-04-25 VITALS — BP 145/85 | HR 97 | Temp 97.8°F | Resp 20 | Ht 64.0 in | Wt 162.6 lb

## 2012-04-25 DIAGNOSIS — D0590 Unspecified type of carcinoma in situ of unspecified breast: Secondary | ICD-10-CM

## 2012-04-25 DIAGNOSIS — C50419 Malignant neoplasm of upper-outer quadrant of unspecified female breast: Secondary | ICD-10-CM

## 2012-04-25 LAB — CBC WITH DIFFERENTIAL/PLATELET
Eosinophils Absolute: 0.1 10*3/uL (ref 0.0–0.5)
LYMPH%: 14.6 % (ref 14.0–49.7)
MONO#: 0.2 10*3/uL (ref 0.1–0.9)
NEUT#: 4 10*3/uL (ref 1.5–6.5)
Platelets: 315 10*3/uL (ref 145–400)
RBC: 3.76 10*6/uL (ref 3.70–5.45)
WBC: 5.1 10*3/uL (ref 3.9–10.3)

## 2012-04-25 LAB — COMPREHENSIVE METABOLIC PANEL (CC13)
Albumin: 3.7 g/dL (ref 3.5–5.0)
CO2: 23 mEq/L (ref 22–29)
Calcium: 10.4 mg/dL (ref 8.4–10.4)
Glucose: 137 mg/dl — ABNORMAL HIGH (ref 70–99)
Potassium: 4 mEq/L (ref 3.5–5.1)
Sodium: 140 mEq/L (ref 136–145)
Total Bilirubin: 0.35 mg/dL (ref 0.20–1.20)
Total Protein: 7.1 g/dL (ref 6.4–8.3)

## 2012-04-25 NOTE — Patient Instructions (Signed)
Doing well.  Lab work looks good.  We will see you back on 3/7.

## 2012-04-25 NOTE — Telephone Encounter (Signed)
gv pt appt schedule for March including appt w/Dr. Dayton Scrape for 3/13.

## 2012-04-25 NOTE — Progress Notes (Signed)
OFFICE PROGRESS NOTE  CC  TULLO,TERESA, MD [address] [place] [address] Dr. Chipper Herb Dr. Jane Canary  DIAGNOSIS: 52 year old female with diagnosis of stage IIIA T3, N1, M0 invasive lobular carcinoma, Of the right breast with DCIS of the the left breast. Essentially patient has bilateral breast cancers.  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   #2 patient has begun FEC 100 starting on 12/08/2011 - 01/18/12. A total of 4 cycles of this chemotherapy is planned and then thereafter she will proceed with Taxol as a single agent for total of 12 weeks.  #3 on 02/02/2012 patient will begin singly agent weekly Taxol for a total of 12 weeks completed on 04/20/12.  CURRENT THERAPY Cycle 12 of Taxol  INTERVAL HISTORY: Mary [name] 52 y.o. female returns for follow-up visit today.  She completed her chemotherapy last week and is doing well.  Her lab work remains stable. She has a bilateral mastectomy scheduled on 2/28, f/u with Dr. Welton Flakes on 3/7.  She met with Dr. Odis Luster to discuss reconstruction and was overwhelmed with her visit, and will continue to think about her surgical reconstruction options.  Otherwise, she's feeling well and denies fevers, chills, or any other concerns.     MEDICAL HISTORY: Past Medical History  Diagnosis Date  . Irritable bowel syndrome   . GERD (gastroesophageal reflux disease)     Nexium as needed  . Dental crowns present     x 4  . Neutropenia, drug-induced 12/15/2011  . Breast cancer August 2013    bilateral    ALLERGIES:   has no known allergies.  MEDICATIONS:  Current Outpatient Prescriptions  Medication Sig Dispense Refill  . ALPRAZolam (XANAX) 0.25 MG tablet Take 0.25 mg by mouth 3 (three) times daily as needed.       . Ascorbic Acid (VITAMIN C) 100 MG tablet Take 100 mg by  mouth daily.        Marland Kitchen b complex vitamins tablet Take 1 tablet by mouth daily.       . calcium carbonate (OS-CAL) 600 MG TABS Take 600 mg by mouth 2 (two) times daily with a meal.        . cholecalciferol (VITAMIN D) 400 UNITS TABS Take by mouth.      . loperamide (IMODIUM A-D) 2 MG tablet Take 2 mg by mouth 4 (four) times daily as needed.      Marland Kitchen LORazepam (ATIVAN) 0.5 MG tablet Take 1 tablet (0.5 mg total) by mouth every 8 (eight) hours.  120 tablet  0  . Multiple Vitamin (MULTIVITAMIN) tablet Take 1 tablet by mouth daily.        Marland Kitchen NEXIUM 40 MG capsule TAKE 1 CAPSULE BY MOUTH DAILY.  30 capsule  6  . vitamin B-12 (CYANOCOBALAMIN) 100 MCG tablet Take 50 mcg by mouth daily.        Marland Kitchen zolpidem (AMBIEN CR) 12.5 MG CR tablet Take 1 tablet (12.5 mg total) by mouth at bedtime as needed for sleep.  30 tablet  0  . ciprofloxacin (CIPRO) 500 MG tablet Take 1 tablet (500 mg total) by mouth 2 (two) times daily.  14 tablet  6  . dexamethasone (DECADRON) 4 MG tablet Take 2 tablets (8 mg total) by mouth 2 (two) times daily with a meal. Take daily starting the day after chemotherapy for  2 days. Take with food.  30 tablet  1  . lidocaine-prilocaine (EMLA) cream Apply topically as needed.  30 g  6  . Misc. Devices MISC 1 each by Does not apply route daily.  1 each  0  . ondansetron (ZOFRAN) 8 MG tablet Take 1 tablet (8 mg total) by mouth 2 (two) times daily. Take two times a day starting the day after chemo for 2 days. Then take two times a day as needed for nausea or vomiting.  30 tablet  1  . prochlorperazine (COMPAZINE) 10 MG tablet Take 1 tablet (10 mg total) by mouth every 6 (six) hours as needed (Nausea or vomiting).  30 tablet  1  . prochlorperazine (COMPAZINE) 25 MG suppository Place 1 suppository (25 mg total) rectally every 12 (twelve) hours as needed for nausea.  12 suppository  3    SURGICAL HISTORY:  Past Surgical History  Procedure Date  . Portacath placement 12/02/2011    Procedure: INSERTION  PORT-A-CATH;  Surgeon: Ernestene Mention, MD;  Location: [facility];  Service: General;  Laterality: N/A;  insertion port a cath with fluoroscopy  . Breast lumpectomy 2008    left    REVIEW OF SYSTEMS:   General: fatigue (+), night sweats (-), fever (-), pain (-) Lymph: palpable nodes (-) HEENT: vision changes (-), mucositis (-), gum bleeding (-), epistaxis (-) Cardiovascular: chest pain (-), palpitations (-) Pulmonary: shortness of breath (-), dyspnea on exertion (-), cough (-), hemoptysis (-) GI:  Early satiety (-), melena (-), dysphagia (-), nausea/vomiting (-), diarrhea (-) GU: dysuria (-), hematuria (-), incontinence (-) Musculoskeletal: joint swelling (-), joint pain (-), back pain (-) Neuro: weakness (-), numbness (-), headache (-), confusion (-) Skin: Rash (-), lesions (-), dryness (-) Psych: depression (-), suicidal/homicidal ideation (-), feeling of hopelessness (-)   PHYSICAL EXAMINATION:  BP 145/85  Pulse 97  Temp 97.8 F (36.6 C)  Resp 20  Ht 5\' 4"  (1.626 m)  Wt 162 lb 9.6 oz (73.755 kg)  BMI 27.91 kg/m2 General: Patient is a well appearing female in no acute distress HEENT: PERRLA, sclerae anicteric no conjunctival pallor, MMM Neck: supple, no palpable adenopathy Lungs: clear to auscultation bilaterally, no wheezes, rhonchi, or rales Cardiovascular: regular rate rhythm, S1, S2, no murmurs, rubs or gallops Abdomen: Soft, non-tender, non-distended, normoactive bowel sounds, no HSM Extremities: warm and well perfused, no clubbing, cyanosis, or edema Skin: No lesions or rash Neuro: Non-focal Breast: right breast with retracted nipple, and palpable mass, left breast, no masses or abnormalities, the breast is much softer and the mass is now mobile ECOG PERFORMANCE STATUS: 0 - Asymptomatic   LABORATORY DATA: Lab Results  Component Value Date   WBC 5.1 04/25/2012   HGB 13.2 04/25/2012   HCT 38.6 04/25/2012   MCV 102.7* 04/25/2012   PLT 315 04/25/2012       Chemistry      Component Value Date/Time   NA 140 04/25/2012 1049   NA 140 05/20/2011 1204   K 4.0 04/25/2012 1049   K 5.2 05/20/2011 1204   CL 105 04/25/2012 1049   CL 108 05/20/2011 1204   CO2 23 04/25/2012 1049   CO2 22 05/20/2011 1204   BUN 15.4 04/25/2012 1049   BUN 10 05/20/2011 1204   CREATININE 0.8 04/25/2012 1049   CREATININE 0.82 05/20/2011 1204   CREATININE 0.9 01/18/2011 1202      Component Value Date/Time   CALCIUM 10.4 04/25/2012 1049   CALCIUM 8.6 05/20/2011  1204   ALKPHOS 93 04/25/2012 1049   ALKPHOS 80 05/20/2011 1204   AST 16 04/25/2012 1049   AST 21 05/20/2011 1204   ALT 21 04/25/2012 1049   ALT 29 05/20/2011 1204   BILITOT 0.35 04/25/2012 1049   BILITOT 0.3 05/20/2011 1204       RADIOGRAPHIC STUDIES:  ASSESSMENT: 52 year old female with  #1 T3, N1, 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 Patient is now going to receive neoadjuvant chemotherapy consisting of FEC 100. She will receive dose dense FEC with day 2 Neulasta. She is overall doing well she understands the rationale for neoadjuvant treatment she understands the side effects of her chemotherapy. She also has her antiemetics already at her disposal to take as directed.  #2 patient has now completed 4 cycles of neoadjuvant FEC 100.  #3 she will begin now again neoadjuvant single agent Taxol weekly beginning 02/02/2012.  She completed this on 04/20/12.    PLAN:  #1 Ms. Ketterman is doing well.  She has completed chemotherapy and her lab work is stable.  She will proceed with surgery on 2/28.    #2 We will see Ms. Trachtenberg back on 05/25/12.  I referred her to Dr. Dayton Scrape after her surgery.    All questions were answered. The patient knows to call the clinic with any problems, questions or concerns. We can certainly see the patient much sooner if necessary.  I spent 15 minutes counseling the patient face to face. The total time spent in the appointment was 30 minutes.  Cherie Ouch Lyn Hollingshead, NP Medical  Oncology [facility] Phone: [phone] 04/25/2012, 12:45 PM

## 2012-04-27 ENCOUNTER — Telehealth: Payer: Self-pay | Admitting: *Deleted

## 2012-04-27 NOTE — Telephone Encounter (Signed)
CALLED PATIENT TO INFORM OF [facility] BEING MOVED TO LATER IN THE DAY ON 05-31-12 - ARRIVAL 2:30 PM DUE TO DR. Dayton Scrape BEING IN AN IMPLANT, PATIENT AGREED TO NEW TIME

## 2012-05-01 ENCOUNTER — Other Ambulatory Visit: Payer: 59 | Admitting: Lab

## 2012-05-08 ENCOUNTER — Encounter (HOSPITAL_COMMUNITY): Payer: Self-pay

## 2012-05-09 NOTE — Pre-Procedure Instructions (Signed)
Mary [name]  05/09/2012   Your procedure is scheduled on:  Fri, Feb 28 @ 1:30 PM  Report to [facility] at 11:30 AM.  Call this number if you have problems the morning of surgery: [phone]   Remember:   Do not eat food or drink liquids after midnight.   Take these medicines the morning of surgery with A SIP OF WATER: Alprazolam(Xanax) and Nexium(Esomeprazole)   Do not wear jewelry, make-up or nail polish.  Do not wear lotions, powders, or perfumes. You may wear deodorant.  Do not shave 48 hours prior to surgery.  Do not bring valuables to the hospital.  Contacts, dentures or bridgework may not be worn into surgery.  Leave suitcase in the car. After surgery it may be brought to your room.  For patients admitted to the hospital, checkout time is 11:00 AM the day of  discharge.   Patients discharged the day of surgery will not be allowed to drive  home.  Special Instructions: Shower using CHG 2 nights before surgery and the night before surgery.  If you shower the day of surgery use CHG.  Use special wash - you have one bottle of CHG for all showers.  You should use approximately 1/3 of the bottle for each shower.   Please read over the following fact sheets that you were given: Pain Booklet, Coughing and Deep Breathing, MRSA Information and Surgical Site Infection Prevention

## 2012-05-10 ENCOUNTER — Encounter (HOSPITAL_COMMUNITY): Payer: Self-pay

## 2012-05-10 ENCOUNTER — Encounter (HOSPITAL_COMMUNITY)
Admission: RE | Admit: 2012-05-10 | Discharge: 2012-05-10 | Disposition: A | Discharge status: Home / Self Care | Payer: 59 | Source: Ambulatory Visit | Attending: General Surgery | Admitting: General Surgery

## 2012-05-10 HISTORY — DX: Personal history of other diseases of the respiratory system: Z87.09

## 2012-05-10 HISTORY — DX: Insomnia, unspecified: G47.00

## 2012-05-10 HISTORY — DX: Irritable bowel syndrome, unspecified: K58.9

## 2012-05-10 HISTORY — DX: Anxiety disorder, unspecified: F41.9

## 2012-05-10 HISTORY — DX: Personal history of colon polyps, unspecified: Z86.0100

## 2012-05-10 HISTORY — DX: Cough, unspecified: R05.9

## 2012-05-10 HISTORY — DX: Other complications of anesthesia, initial encounter: T88.59XA

## 2012-05-10 HISTORY — DX: Frequency of micturition: R35.0

## 2012-05-10 HISTORY — DX: Adverse effect of unspecified anesthetic, initial encounter: T41.45XA

## 2012-05-10 HISTORY — DX: Nocturia: R35.1

## 2012-05-10 HISTORY — DX: Personal history of colonic polyps: Z86.010

## 2012-05-10 HISTORY — DX: Cough: R05

## 2012-05-10 LAB — BASIC METABOLIC PANEL
BUN: 19 mg/dL (ref 6–23)
Chloride: 103 mEq/L (ref 96–112)
Creatinine, Ser: 0.81 mg/dL (ref 0.50–1.10)
Potassium: 4 mEq/L (ref 3.5–5.1)

## 2012-05-10 LAB — CBC
HCT: 40.5 % (ref 36.0–46.0)
Hemoglobin: 13.8 g/dL (ref 12.0–15.0)
MCH: 33.9 pg (ref 26.0–34.0)
MCV: 99.5 fL (ref 78.0–100.0)
Platelets: 290 10*3/uL (ref 150–400)
RBC: 4.07 MIL/uL (ref 3.87–5.11)

## 2012-05-10 LAB — SURGICAL PCR SCREEN
MRSA, PCR: NEGATIVE
Staphylococcus aureus: NEGATIVE

## 2012-05-10 MED ORDER — CHLORHEXIDINE GLUCONATE 4 % EX LIQD: 1.0000 "application " | Freq: Once | CUTANEOUS | Status: DC

## 2012-05-10 NOTE — Progress Notes (Signed)
Pt doesn't have a cardiologist  Denies ever having a stress test/heart cath  Echo report in epic from 12-01-11  Dr.Teresa Tullow  Denies ever having an ekg but cxr in epic from 9-11/13

## 2012-05-15 NOTE — H&P (Signed)
Mary [name]    MRN:  [id number]   Description: 52 year old female  Provider: Ernestene Mention, MD  Department: [facility]        Diagnoses    Breast cancer    -  Primary    174.9    Cancer of upper-outer quadrant of female breast        174.4        Current Vitals -    BP Pulse Temp(Src) Resp Ht Wt    132/76 74 97.8 F (36.6 C) (Temporal) 18 5\' 4"  (1.626 m) 162 lb 4 oz (73.596 kg)     BMI - 27.84 kg/m2                  History and Physical    Ernestene Mention, MD    Status: Signed                       HPI Mary [name] is a 52 y.o. female.  She returns to see me for management of her bilateral breast cancer. She is near the end of her neoadjuvant chemotherapy.   She feels like she has had a significant positive response in her right breast. She says she really cannot feel the mass in the morning and the skin looks normal    She was initially evaluated in the [facility] on 12/14/2011. She had a large mass in the right breast, 8-10 cm, centrally with skin thickening and nipple inversion. She also had calcifications in the upper outer quadrant of the left breast. Biopsy of the right breast showed invasive cancer, probably lobular, receptor positive, HER-2-negative, clinical stage T3, N1. 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 I placed the Port-A-Cath and she has been receiving chemotherapy.  She had positive right axillary lymph node by MRI and by PET CT, but this was never biopsied.   Recent MRI shows dramatic response on the right side with very minimal enhancement and normal lymph nodes.Left side looks normal.   She states that Dr. Welton Flakes is going to give her last chemotherapy on January 31. She states  Dr. Welton Flakes told her she can have her surgery after that is done.   We spent a very long time talking about surgical options on the right and on the left. We went  over  plastic surgery issues. Basically she has decided she wants bilateral mastectomy, stating that she does not want to have another issue on the left side. She is interested in reconstruction. She is aware that this may have to be delayed because she is planning to get radiation therapy to the right breast.   We are going to tentatively schedule her for a bilateral mastectomy, right axillary lymph node dissection, left SLN biopsy and possible Port-A-Cath removal. We're also been referred to plastic surgery preop so they can begin their conversation about plastic surgical options and timing of reconstruction.       Past Medical History   Diagnosis  Date   .  Irritable bowel syndrome     .  GERD (gastroesophageal reflux disease)         Nexium as needed   .  Dental crowns present         x 4   .  Neutropenia, drug-induced  12/15/2011   .  Breast cancer  August 2013  bilateral         Past Surgical History   Procedure  Date   .  Portacath placement  12/02/2011       Procedure: INSERTION PORT-A-CATH;  Surgeon: Ernestene Mention, MD;  Location: [facility];  Service: General;  Laterality: N/A;  insertion port a cath with fluoroscopy   .  Breast lumpectomy  2008       left         Family History   Problem  Relation  Age of Onset   .  Hypertension  Mother     .  Hypertension  Maternal Grandmother     .  Pancreatic cancer  Paternal Grandmother         diagnosed in her 43s   .  Ovarian cancer  Maternal Aunt         maternal grandmother's sister   .  Breast cancer  Paternal Aunt         diagnosed in late 49s early 30s   .  Parkinsonism  Paternal Grandfather          Social History History   Substance Use Topics   .  Smoking status:  Former Smoker       Quit date:  01/18/1991   .  Smokeless tobacco:  Never Used   .  Alcohol Use:  0.0 oz/week         Comment: occasionally        No Known Allergies    Current Outpatient Prescriptions   Medication   Sig  Dispense  Refill   .  ALPRAZolam (XANAX) 0.25 MG tablet  Take 0.25 mg by mouth 3 (three) times daily as needed.          .  Ascorbic Acid (VITAMIN C) 100 MG tablet  Take 100 mg by mouth daily.           Marland Kitchen  b complex vitamins tablet  Take 1 tablet by mouth daily.          .  calcium carbonate (OS-CAL) 600 MG TABS  Take 600 mg by mouth 2 (two) times daily with a meal.           .  cholecalciferol (VITAMIN D) 400 UNITS TABS  Take by mouth.         .  ciprofloxacin (CIPRO) 500 MG tablet  Take 1 tablet (500 mg total) by mouth 2 (two) times daily.   14 tablet   6   .  dexamethasone (DECADRON) 4 MG tablet  Take 2 tablets (8 mg total) by mouth 2 (two) times daily with a meal. Take daily starting the day after chemotherapy for 2 days. Take with food.   30 tablet   1   .  lidocaine-prilocaine (EMLA) cream  Apply topically as needed.   30 g   6   .  loperamide (IMODIUM A-D) 2 MG tablet  Take 2 mg by mouth 4 (four) times daily as needed.         Marland Kitchen  LORazepam (ATIVAN) 0.5 MG tablet  Take 1 tablet (0.5 mg total) by mouth every 8 (eight) hours.   120 tablet   0   .  Misc. Devices MISC  1 each by Does not apply route daily.   1 each   0   .  Multiple Vitamin (MULTIVITAMIN) tablet  Take 1 tablet by mouth daily.           Marland Kitchen  NEXIUM 40 MG capsule  TAKE 1 CAPSULE BY MOUTH DAILY.   30 capsule   6   .  ondansetron (ZOFRAN) 8 MG tablet  Take 1 tablet (8 mg total) by mouth 2 (two) times daily. Take two times a day starting the day after chemo for 2 days. Then take two times a day as needed for nausea or vomiting.   30 tablet   1   .  prochlorperazine (COMPAZINE) 10 MG tablet  Take 1 tablet (10 mg total) by mouth every 6 (six) hours as needed (Nausea or vomiting).   30 tablet   1   .  prochlorperazine (COMPAZINE) 25 MG suppository  Place 1 suppository (25 mg total) rectally every 12 (twelve) hours as needed for nausea.   12 suppository   3   .  vitamin B-12 (CYANOCOBALAMIN) 100 MCG tablet  Take 50 mcg by mouth daily.            Marland Kitchen  zolpidem (AMBIEN CR) 12.5 MG CR tablet  Take 1 tablet (12.5 mg total) by mouth at bedtime as needed for sleep.   30 tablet   0        Review of Systems   Constitutional: Negative for fever, chills and unexpected weight change.  HENT: Negative for hearing loss, congestion, sore throat, trouble swallowing and voice change.         Alopecia  Eyes: Negative for visual disturbance.  Respiratory: Negative for cough and wheezing.   Cardiovascular: Negative for chest pain, palpitations and leg swelling.  Gastrointestinal: Negative for nausea, vomiting, abdominal pain, diarrhea, constipation, blood in stool, abdominal distention and anal bleeding.  Genitourinary: Negative for hematuria, vaginal bleeding and difficulty urinating.  Musculoskeletal: Negative for arthralgias.  Skin: Negative for rash and wound.  Neurological: Negative for seizures, syncope and headaches.  Hematological: Negative for adenopathy. Does not bruise/bleed easily.  Psychiatric/Behavioral: Negative for confusion.      Blood pressure 132/76, pulse 74, temperature 97.8 F (36.6 C), temperature source Temporal, resp. rate 18, height 5\' 4"  (1.626 m), weight 162 lb 4 oz (73.596 kg).   Physical Exam   Constitutional: She is oriented to person, place, and time. She appears well-developed and well-nourished. No distress.  HENT:   Head: Normocephalic and atraumatic.   Nose: Nose normal.   Mouth/Throat: No oropharyngeal exudate.  Eyes: Conjunctivae normal and EOM are normal. Pupils are equal, round, and reactive to light. Left eye exhibits no discharge. No scleral icterus.  Neck: Neck supple. No JVD present. No tracheal deviation present. No thyromegaly present.  Cardiovascular: Normal rate, regular rhythm, normal heart sounds and intact distal pulses.    No murmur heard. Pulmonary/Chest: Effort normal and breath sounds normal. No respiratory distress. She has no wheezes. She has no rales. She exhibits no  tenderness.       Right breast smaller than left, the skin now looks normal and breast feels normal. Left breast exam unremarkable. No axillary or supraclavicular adenopathy.  Abdominal: Soft. Bowel sounds are normal. She exhibits no distension and no mass. There is no tenderness. There is no rebound and no guarding.  Musculoskeletal: She exhibits no edema and no tenderness.  Lymphadenopathy:    She has no cervical adenopathy.  Neurological: She is alert and oriented to person, place, and time. She exhibits normal muscle tone. Coordination normal.  Skin: Skin is warm. No rash noted. She is not diaphoretic. No erythema. No pallor.  Psychiatric: She has a normal mood and affect. Her behavior  is normal. Judgment and thought content normal.      Data Reviewed Recent MRI. Cancer center notes. Old records.   Assessment    Next invasive and noninvasive cancer right breast, probable lobular phenotype, receptor positive, HER-2/neu negative. Pretreatment clinical stage TIII N1.   High-grade DCIS with comedo necrosis, left breast, upper outer quadrant, clinical stage Tis, N0   Status post neoadjuvant chemotherapy with excellent clinical and radiographic response   Remote history left breast biopsy for fibroadenoma 5 years ago   Hypertension   Irritable bowel syndrome     Plan    She will be tentatively scheduled for bilateral total mastectomy, right axillary lymph node dissection, left axillary sentinel node biopsy, and possible removal of Port-A-Cath.   I discussed the indications, details, techniques, and numerous risks of the surgery with the patient and her husband. They understand all these issues. All their questions are answered . They agree  with this plan.   She will be referred to plastic surgery for preop consultation regarding options and timing of reconstruction. They may or may not want to delay reconstruction because of the planned radiation therapy to the right  chest wall.   If they decide on immediate reconstruction we can always reschedule.          Angelia Mould. Derrell Lolling, M.D., [facility] General and Minimally invasive Surgery Breast and Colorectal Surgery Office:   [phone] Pager:   [phone]

## 2012-05-17 MED ORDER — CEFAZOLIN SODIUM-DEXTROSE 2-3 GM-% IV SOLR
2.0000 g | INTRAVENOUS | Status: AC
  Administered 2012-05-18: 2 g via INTRAVENOUS
  Filled 2012-05-17: qty 50

## 2012-05-18 ENCOUNTER — Inpatient Hospital Stay (HOSPITAL_COMMUNITY)
Admission: RE | Admit: 2012-05-18 | Discharge: 2012-05-20 | DRG: 583 | Disposition: A | Discharge status: Home / Self Care | Payer: 59 | Source: Ambulatory Visit | Attending: General Surgery | Admitting: General Surgery

## 2012-05-18 ENCOUNTER — Encounter (HOSPITAL_COMMUNITY): Payer: Self-pay | Admitting: Certified Registered Nurse Anesthetist

## 2012-05-18 ENCOUNTER — Encounter (HOSPITAL_COMMUNITY)
Admission: RE | Disposition: A | Discharge status: Home / Self Care | Payer: Self-pay | Source: Ambulatory Visit | Attending: General Surgery

## 2012-05-18 ENCOUNTER — Encounter (HOSPITAL_COMMUNITY)
Admission: RE | Admit: 2012-05-18 | Discharge: 2012-05-18 | Disposition: A | Discharge status: Home / Self Care | Payer: 59 | Source: Ambulatory Visit | Attending: General Surgery | Admitting: General Surgery

## 2012-05-18 ENCOUNTER — Ambulatory Visit (HOSPITAL_COMMUNITY): Payer: 59 | Admitting: Certified Registered Nurse Anesthetist

## 2012-05-18 DIAGNOSIS — N641 Fat necrosis of breast: Secondary | ICD-10-CM | POA: Diagnosis present

## 2012-05-18 DIAGNOSIS — C50412 Malignant neoplasm of upper-outer quadrant of left female breast: Secondary | ICD-10-CM | POA: Diagnosis present

## 2012-05-18 DIAGNOSIS — Z803 Family history of malignant neoplasm of breast: Secondary | ICD-10-CM

## 2012-05-18 DIAGNOSIS — I10 Essential (primary) hypertension: Secondary | ICD-10-CM | POA: Diagnosis present

## 2012-05-18 DIAGNOSIS — C50419 Malignant neoplasm of upper-outer quadrant of unspecified female breast: Principal | ICD-10-CM | POA: Diagnosis present

## 2012-05-18 DIAGNOSIS — K589 Irritable bowel syndrome without diarrhea: Secondary | ICD-10-CM | POA: Diagnosis present

## 2012-05-18 DIAGNOSIS — Z79899 Other long term (current) drug therapy: Secondary | ICD-10-CM

## 2012-05-18 DIAGNOSIS — Z9221 Personal history of antineoplastic chemotherapy: Secondary | ICD-10-CM

## 2012-05-18 DIAGNOSIS — Z87891 Personal history of nicotine dependence: Secondary | ICD-10-CM

## 2012-05-18 DIAGNOSIS — D0590 Unspecified type of carcinoma in situ of unspecified breast: Secondary | ICD-10-CM | POA: Diagnosis present

## 2012-05-18 DIAGNOSIS — C50919 Malignant neoplasm of unspecified site of unspecified female breast: Secondary | ICD-10-CM

## 2012-05-18 DIAGNOSIS — Z452 Encounter for adjustment and management of vascular access device: Secondary | ICD-10-CM

## 2012-05-18 DIAGNOSIS — F411 Generalized anxiety disorder: Secondary | ICD-10-CM | POA: Diagnosis present

## 2012-05-18 DIAGNOSIS — C50411 Malignant neoplasm of upper-outer quadrant of right female breast: Secondary | ICD-10-CM

## 2012-05-18 DIAGNOSIS — K219 Gastro-esophageal reflux disease without esophagitis: Secondary | ICD-10-CM | POA: Diagnosis present

## 2012-05-18 DIAGNOSIS — Z170 Estrogen receptor positive status [ER+]: Secondary | ICD-10-CM

## 2012-05-18 HISTORY — PX: PORT-A-CATH REMOVAL: SHX5289

## 2012-05-18 HISTORY — PX: SIMPLE MASTECTOMY WITH AXILLARY SENTINEL NODE BIOPSY: SHX6098

## 2012-05-18 HISTORY — PX: MASTECTOMY WITH AXILLARY LYMPH NODE DISSECTION: SHX5661

## 2012-05-18 SURGERY — MASTECTOMY WITH AXILLARY LYMPH NODE DISSECTION
Anesthesia: General | Site: Breast | Laterality: Right | Wound class: Clean

## 2012-05-18 MED ORDER — ONDANSETRON HCL 4 MG PO TABS: 4.0000 mg | ORAL_TABLET | Freq: Four times a day (QID) | ORAL | Status: DC | PRN

## 2012-05-18 MED ORDER — SODIUM CHLORIDE 0.9 % IJ SOLN
INTRAMUSCULAR | Status: AC
  Filled 2012-05-18: qty 6

## 2012-05-18 MED ORDER — HYDROMORPHONE HCL PF 1 MG/ML IJ SOLN
INTRAMUSCULAR | Status: AC
  Filled 2012-05-18: qty 1

## 2012-05-18 MED ORDER — SODIUM CHLORIDE 0.9 % IJ SOLN
INTRAMUSCULAR | Status: DC | PRN
  Administered 2012-05-18: 14:00:00 via INTRAMUSCULAR

## 2012-05-18 MED ORDER — NEOSTIGMINE METHYLSULFATE 1 MG/ML IJ SOLN
INTRAMUSCULAR | Status: DC | PRN
  Administered 2012-05-18: 2 mg via INTRAVENOUS

## 2012-05-18 MED ORDER — LOPERAMIDE HCL 2 MG PO CAPS: 2.0000 mg | ORAL_CAPSULE | Freq: Four times a day (QID) | ORAL | Status: DC | PRN

## 2012-05-18 MED ORDER — LOPERAMIDE HCL 2 MG PO TABS: 2.0000 mg | ORAL_TABLET | Freq: Four times a day (QID) | ORAL | Status: DC | PRN

## 2012-05-18 MED ORDER — LIDOCAINE-EPINEPHRINE (PF) 1 %-1:200000 IJ SOLN
INTRAMUSCULAR | Status: AC
  Filled 2012-05-18: qty 10

## 2012-05-18 MED ORDER — EPHEDRINE SULFATE 50 MG/ML IJ SOLN
INTRAMUSCULAR | Status: DC | PRN
  Administered 2012-05-18: 5 mg via INTRAVENOUS

## 2012-05-18 MED ORDER — LIDOCAINE HCL (CARDIAC) 20 MG/ML IV SOLN
INTRAVENOUS | Status: DC | PRN
  Administered 2012-05-18: 100 mg via INTRAVENOUS

## 2012-05-18 MED ORDER — ONDANSETRON HCL 4 MG/2ML IJ SOLN: 4.0000 mg | Freq: Once | INTRAMUSCULAR | Status: DC | PRN

## 2012-05-18 MED ORDER — OXYCODONE HCL 5 MG/5ML PO SOLN: 5.0000 mg | Freq: Once | ORAL | Status: AC | PRN

## 2012-05-18 MED ORDER — GLYCOPYRROLATE 0.2 MG/ML IJ SOLN
INTRAMUSCULAR | Status: DC | PRN
  Administered 2012-05-18: .4 mg via INTRAVENOUS

## 2012-05-18 MED ORDER — MORPHINE SULFATE 2 MG/ML IJ SOLN: 2.0000 mg | INTRAMUSCULAR | Status: DC | PRN

## 2012-05-18 MED ORDER — OXYCODONE HCL 5 MG PO TABS
5.0000 mg | ORAL_TABLET | Freq: Once | ORAL | Status: AC | PRN
  Administered 2012-05-18: 5 mg via ORAL

## 2012-05-18 MED ORDER — FENTANYL CITRATE 0.05 MG/ML IJ SOLN
INTRAMUSCULAR | Status: AC
  Filled 2012-05-18: qty 2

## 2012-05-18 MED ORDER — 0.9 % SODIUM CHLORIDE (POUR BTL) OPTIME
TOPICAL | Status: DC | PRN
  Administered 2012-05-18 (×4): 1000 mL

## 2012-05-18 MED ORDER — LACTATED RINGERS IV SOLN
INTRAVENOUS | Status: DC | PRN
  Administered 2012-05-18 (×3): via INTRAVENOUS

## 2012-05-18 MED ORDER — CEFAZOLIN SODIUM-DEXTROSE 2-3 GM-% IV SOLR
2.0000 g | Freq: Three times a day (TID) | INTRAVENOUS | Status: AC
  Administered 2012-05-18 – 2012-05-19 (×3): 2 g via INTRAVENOUS
  Filled 2012-05-18 (×3): qty 50

## 2012-05-18 MED ORDER — ONDANSETRON HCL 4 MG/2ML IJ SOLN: 4.0000 mg | Freq: Four times a day (QID) | INTRAMUSCULAR | Status: DC | PRN

## 2012-05-18 MED ORDER — CHOLECALCIFEROL 10 MCG (400 UNIT) PO TABS
400.0000 [IU] | ORAL_TABLET | Freq: Every day | ORAL | Status: DC
  Administered 2012-05-19 – 2012-05-20 (×2): 400 [IU] via ORAL
  Filled 2012-05-18 (×2): qty 1

## 2012-05-18 MED ORDER — ROCURONIUM BROMIDE 100 MG/10ML IV SOLN
INTRAVENOUS | Status: DC | PRN
  Administered 2012-05-18: 50 mg via INTRAVENOUS

## 2012-05-18 MED ORDER — MIDAZOLAM HCL 2 MG/2ML IJ SOLN
INTRAMUSCULAR | Status: AC
  Filled 2012-05-18: qty 2

## 2012-05-18 MED ORDER — OXYCODONE HCL 5 MG PO TABS
ORAL_TABLET | ORAL | Status: AC
  Filled 2012-05-18: qty 1

## 2012-05-18 MED ORDER — LIDOCAINE-EPINEPHRINE (PF) 1 %-1:200000 IJ SOLN
INTRAMUSCULAR | Status: DC | PRN
  Administered 2012-05-18: 1 mL

## 2012-05-18 MED ORDER — POTASSIUM CHLORIDE IN NACL 20-0.9 MEQ/L-% IV SOLN
INTRAVENOUS | Status: DC
  Administered 2012-05-18 – 2012-05-20 (×3): via INTRAVENOUS
  Filled 2012-05-18 (×4): qty 1000

## 2012-05-18 MED ORDER — ZOLPIDEM TARTRATE 5 MG PO TABS: 5.0000 mg | ORAL_TABLET | Freq: Every evening | ORAL | Status: DC | PRN

## 2012-05-18 MED ORDER — HYDROMORPHONE HCL PF 1 MG/ML IJ SOLN
0.2500 mg | INTRAMUSCULAR | Status: DC | PRN
  Administered 2012-05-18: 0.5 mg via INTRAVENOUS

## 2012-05-18 MED ORDER — HYDROCODONE-ACETAMINOPHEN 5-325 MG PO TABS
1.0000 | ORAL_TABLET | ORAL | Status: DC | PRN
  Administered 2012-05-18: 1 via ORAL
  Administered 2012-05-19 – 2012-05-20 (×5): 2 via ORAL
  Filled 2012-05-18: qty 1
  Filled 2012-05-18 (×5): qty 2

## 2012-05-18 MED ORDER — MIDAZOLAM HCL 5 MG/5ML IJ SOLN
INTRAMUSCULAR | Status: DC | PRN
  Administered 2012-05-18: 2 mg via INTRAVENOUS

## 2012-05-18 MED ORDER — FENTANYL CITRATE 0.05 MG/ML IJ SOLN
INTRAMUSCULAR | Status: DC | PRN
  Administered 2012-05-18 (×2): 50 ug via INTRAVENOUS
  Administered 2012-05-18: 150 ug via INTRAVENOUS

## 2012-05-18 MED ORDER — ONDANSETRON HCL 4 MG/2ML IJ SOLN
INTRAMUSCULAR | Status: DC | PRN
  Administered 2012-05-18: 4 mg via INTRAVENOUS

## 2012-05-18 MED ORDER — PROPOFOL 10 MG/ML IV BOLUS
INTRAVENOUS | Status: DC | PRN
  Administered 2012-05-18: 120 mg via INTRAVENOUS

## 2012-05-18 MED ORDER — LORAZEPAM 1 MG PO TABS
1.5000 mg | ORAL_TABLET | Freq: Every day | ORAL | Status: DC
  Administered 2012-05-18 – 2012-05-19 (×2): 1.5 mg via ORAL
  Filled 2012-05-18 (×2): qty 1

## 2012-05-18 MED ORDER — ENOXAPARIN SODIUM 30 MG/0.3ML ~~LOC~~ SOLN
40.0000 mg | SUBCUTANEOUS | Status: DC
  Administered 2012-05-19 – 2012-05-20 (×2): 40 mg via SUBCUTANEOUS
  Filled 2012-05-18 (×3): qty 0.4

## 2012-05-18 MED ORDER — TECHNETIUM TC 99M SULFUR COLLOID FILTERED
1.0000 | Freq: Once | INTRAVENOUS | Status: AC | PRN
  Administered 2012-05-18: 1 via INTRADERMAL

## 2012-05-18 MED ORDER — MEPERIDINE HCL 25 MG/ML IJ SOLN: 6.2500 mg | INTRAMUSCULAR | Status: DC | PRN

## 2012-05-18 MED ORDER — LACTATED RINGERS IV SOLN
INTRAVENOUS | Status: DC
  Administered 2012-05-18: 14:00:00 via INTRAVENOUS

## 2012-05-18 MED ORDER — METHYLENE BLUE 1 % INJ SOLN
INTRAMUSCULAR | Status: AC
  Filled 2012-05-18: qty 10

## 2012-05-18 MED ORDER — HEPARIN SODIUM (PORCINE) 5000 UNIT/ML IJ SOLN
5000.0000 [IU] | Freq: Once | INTRAMUSCULAR | Status: AC
  Administered 2012-05-18: 5000 [IU] via SUBCUTANEOUS
  Filled 2012-05-18: qty 1

## 2012-05-18 MED ORDER — PANTOPRAZOLE SODIUM 40 MG PO TBEC
40.0000 mg | DELAYED_RELEASE_TABLET | Freq: Every day | ORAL | Status: DC
  Administered 2012-05-19 – 2012-05-20 (×2): 40 mg via ORAL
  Filled 2012-05-18 (×2): qty 1

## 2012-05-18 SURGICAL SUPPLY — 61 items
APPLIER CLIP 9.375 MED OPEN (MISCELLANEOUS) ×4
BENZOIN TINCTURE PRP APPL 2/3 (GAUZE/BANDAGES/DRESSINGS) IMPLANT
BINDER BREAST LRG (GAUZE/BANDAGES/DRESSINGS) IMPLANT
BINDER BREAST XLRG (GAUZE/BANDAGES/DRESSINGS) ×4 IMPLANT
BIOPATCH RED 1 DISK 7.0 (GAUZE/BANDAGES/DRESSINGS) ×16 IMPLANT
BLADE SURG 15 STRL LF DISP TIS (BLADE) ×3 IMPLANT
BLADE SURG 15 STRL SS (BLADE) ×1
CANISTER SUCTION 2500CC (MISCELLANEOUS) ×4 IMPLANT
CHLORAPREP W/TINT 10.5 ML (MISCELLANEOUS) IMPLANT
CHLORAPREP W/TINT 26ML (MISCELLANEOUS) ×8 IMPLANT
CLIP APPLIE 9.375 MED OPEN (MISCELLANEOUS) ×3 IMPLANT
CLOTH BEACON ORANGE TIMEOUT ST (SAFETY) ×4 IMPLANT
CONT SPEC 4OZ CLIKSEAL STRL BL (MISCELLANEOUS) ×4 IMPLANT
COVER PROBE W GEL 5X96 (DRAPES) ×4 IMPLANT
COVER SURGICAL LIGHT HANDLE (MISCELLANEOUS) ×4 IMPLANT
DECANTER SPIKE VIAL GLASS SM (MISCELLANEOUS) ×4 IMPLANT
DERMABOND ADHESIVE PROPEN (GAUZE/BANDAGES/DRESSINGS)
DERMABOND ADVANCED (GAUZE/BANDAGES/DRESSINGS) ×4
DERMABOND ADVANCED .7 DNX12 (GAUZE/BANDAGES/DRESSINGS) ×12 IMPLANT
DERMABOND ADVANCED .7 DNX6 (GAUZE/BANDAGES/DRESSINGS) IMPLANT
DRAIN CHANNEL 19F RND (DRAIN) ×16 IMPLANT
DRAPE CHEST BREAST 15X10 FENES (DRAPES) ×4 IMPLANT
DRAPE LAPAROSCOPIC ABDOMINAL (DRAPES) ×4 IMPLANT
DRAPE PED LAPAROTOMY (DRAPES) ×4 IMPLANT
DRAPE UTILITY 15X26 W/TAPE STR (DRAPE) ×8 IMPLANT
DRSG ADAPTIC 3X8 NADH LF (GAUZE/BANDAGES/DRESSINGS) ×8 IMPLANT
DRSG PAD ABDOMINAL 8X10 ST (GAUZE/BANDAGES/DRESSINGS) ×20 IMPLANT
ELECT BLADE 4.0 EZ CLEAN MEGAD (MISCELLANEOUS) ×4
ELECT CAUTERY BLADE 6.4 (BLADE) ×4 IMPLANT
ELECT REM PT RETURN 9FT ADLT (ELECTROSURGICAL) ×4
ELECTRODE BLDE 4.0 EZ CLN MEGD (MISCELLANEOUS) ×3 IMPLANT
ELECTRODE REM PT RTRN 9FT ADLT (ELECTROSURGICAL) ×3 IMPLANT
EVACUATOR SILICONE 100CC (DRAIN) ×24 IMPLANT
GAUZE SPONGE 4X4 16PLY XRAY LF (GAUZE/BANDAGES/DRESSINGS) ×4 IMPLANT
GLOVE EUDERMIC 7 POWDERFREE (GLOVE) ×4 IMPLANT
GOWN PREVENTION PLUS XLARGE (GOWN DISPOSABLE) ×4 IMPLANT
GOWN STRL NON-REIN LRG LVL3 (GOWN DISPOSABLE) ×8 IMPLANT
KIT BASIN OR (CUSTOM PROCEDURE TRAY) ×4 IMPLANT
KIT ROOM TURNOVER OR (KITS) ×4 IMPLANT
NEEDLE 18GX1X1/2 (RX/OR ONLY) (NEEDLE) ×8 IMPLANT
NEEDLE 22X1 1/2 (OR ONLY) (NEEDLE) ×4 IMPLANT
NEEDLE HYPO 25GX1X1/2 BEV (NEEDLE) ×4 IMPLANT
NS IRRIG 1000ML POUR BTL (IV SOLUTION) ×4 IMPLANT
PACK GENERAL/GYN (CUSTOM PROCEDURE TRAY) ×4 IMPLANT
PACK SURGICAL SETUP 50X90 (CUSTOM PROCEDURE TRAY) ×4 IMPLANT
PAD ARMBOARD 7.5X6 YLW CONV (MISCELLANEOUS) ×8 IMPLANT
PENCIL BUTTON HOLSTER BLD 10FT (ELECTRODE) ×4 IMPLANT
SPECIMEN JAR X LARGE (MISCELLANEOUS) ×4 IMPLANT
SPONGE GAUZE 4X4 12PLY (GAUZE/BANDAGES/DRESSINGS) ×4 IMPLANT
SPONGE LAP 18X18 X RAY DECT (DISPOSABLE) ×12 IMPLANT
STAPLER VISISTAT 35W (STAPLE) ×4 IMPLANT
STRIP CLOSURE SKIN 1/2X4 (GAUZE/BANDAGES/DRESSINGS) IMPLANT
SUT ETHILON 3 0 FSL (SUTURE) ×16 IMPLANT
SUT MNCRL AB 4-0 PS2 18 (SUTURE) ×16 IMPLANT
SUT SILK 2 0 FS (SUTURE) ×4 IMPLANT
SUT VIC AB 3-0 SH 18 (SUTURE) ×16 IMPLANT
SYR 3ML LL SCALE MARK (SYRINGE) ×4 IMPLANT
SYR CONTROL 10ML LL (SYRINGE) ×8 IMPLANT
TOWEL OR 17X24 6PK STRL BLUE (TOWEL DISPOSABLE) ×4 IMPLANT
TOWEL OR 17X26 10 PK STRL BLUE (TOWEL DISPOSABLE) ×4 IMPLANT
WATER STERILE IRR 1000ML POUR (IV SOLUTION) IMPLANT

## 2012-05-18 NOTE — Anesthesia Postprocedure Evaluation (Signed)
  Anesthesia Post-op Note  Patient: Mary [name]  Procedure(s) Performed: Procedure(s) with comments: MASTECTOMY WITH AXILLARY LYMPH NODE DISSECTION (Right) - Bilateral Total Mastectomy , right axillary lymph node dissection left axillary sentinel node biopsy, removal of Porta Cath SIMPLE MASTECTOMY WITH AXILLARY SENTINEL NODE BIOPSY (Left) REMOVAL PORT-A-CATH (N/A)  Patient Location: PACU  Anesthesia Type:General  Level of Consciousness: awake  Airway and Oxygen Therapy: Patient Spontanous Breathing  Post-op Pain: mild  Post-op Assessment: Post-op Vital signs reviewed, Patient's Cardiovascular Status Stable, Respiratory Function Stable, Patent Airway, No signs of Nausea or vomiting and Pain level controlled  Post-op Vital Signs: stable  Complications: No apparent anesthesia complications

## 2012-05-18 NOTE — Transfer of Care (Signed)
Immediate Anesthesia Transfer of Care Note  Patient: Mary [name]  Procedure(s) Performed: Procedure(s) with comments: MASTECTOMY WITH AXILLARY LYMPH NODE DISSECTION (Right) - Bilateral Total Mastectomy , right axillary lymph node dissection left axillary sentinel node biopsy, removal of Porta Cath SIMPLE MASTECTOMY WITH AXILLARY SENTINEL NODE BIOPSY (Left) REMOVAL PORT-A-CATH (N/A)  Patient Location: PACU  Anesthesia Type:General  Level of Consciousness: awake, alert , oriented and patient cooperative  Airway & Oxygen Therapy: Patient Spontanous Breathing and Patient connected to nasal cannula oxygen  Post-op Assessment: Report given to PACU RN, Post -op Vital signs reviewed and stable and Patient moving all extremities X 4  Post vital signs: Reviewed and stable  Complications: No apparent anesthesia complications

## 2012-05-18 NOTE — Anesthesia Preprocedure Evaluation (Addendum)
Anesthesia Evaluation  Patient identified by MRN, date of birth, ID band Patient awake    Reviewed: Allergy & Precautions, H&P , NPO status , Patient's Chart, lab work & pertinent test results  Airway Mallampati: II TM Distance: >3 FB Neck ROM: Full    Dental  (+) Dental Advisory Given   Pulmonary former smoker,          Cardiovascular     Neuro/Psych Anxiety    GI/Hepatic GERD-  Medicated and Controlled,  Endo/Other    Renal/GU      Musculoskeletal   Abdominal   Peds  Hematology   Anesthesia Other Findings   Reproductive/Obstetrics                           Anesthesia Physical Anesthesia Plan  ASA: II  Anesthesia Plan: General   Post-op Pain Management:    Induction: Intravenous  Airway Management Planned: Oral ETT  Additional Equipment:   Intra-op Plan:   Post-operative Plan: Extubation in OR  Informed Consent: I have reviewed the patients History and Physical, chart, labs and discussed the procedure including the risks, benefits and alternatives for the proposed anesthesia with the patient or authorized representative who has indicated his/her understanding and acceptance.     Plan Discussed with: CRNA and Surgeon  Anesthesia Plan Comments:        Anesthesia Quick Evaluation

## 2012-05-18 NOTE — Anesthesia Procedure Notes (Signed)
Procedure Name: Intubation Date/Time: 05/18/2012 1:51 PM Performed by: Rogelia Boga Pre-anesthesia Checklist: Patient identified, Emergency Drugs available, Suction available, Patient being monitored and Timeout performed Patient Re-evaluated:Patient Re-evaluated prior to inductionOxygen Delivery Method: Circle system utilized Preoxygenation: Pre-oxygenation with 100% oxygen Intubation Type: IV induction Ventilation: Mask ventilation without difficulty Laryngoscope Size: Mac and 4 Grade View: Grade II Tube size: 7.5 mm Number of attempts: 1 Airway Equipment and Method: Stylet Placement Confirmation: ETT inserted through vocal cords under direct vision,  positive ETCO2 and breath sounds checked- equal and bilateral Secured at: 21 cm Tube secured with: Tape Dental Injury: Teeth and Oropharynx as per pre-operative assessment

## 2012-05-18 NOTE — Preoperative (Signed)
Beta Blockers   Reason not to administer Beta Blockers:Not Applicable 

## 2012-05-18 NOTE — Op Note (Signed)
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 Derrell Lolling, M.D., FACS  Assistant:                      Manus Rudd, M.D., FACS  Operative Indications:   Mary [name] is a 52 y.o. female. She is admitted electively for surgical  management of her bilateral breast cancer. She As completed her neoadjuvant chemotherapy.  She  has had a significant positive response in her right breast. She says she really cannot feel the mass  anymore and the skin looks normal  She was initially evaluated in the [facility] on 12/14/2011. She had a large mass in the right breast, 8-10 cm, centrally with skin thickening and nipple inversion. She also had calcifications in the upper outer quadrant of the left breast. Biopsy of the right breast showed invasive cancer, probably lobular, receptor positive, HER-2-negative, clinical stage T3, N1. 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 I placed the Port-A-Cath and she has  Received neoadjuvant  chemotherapy. She had positive right axillary lymph node by MRI and by PET CT, but this was never biopsied.  Recent MRI shows dramatic response on the right side with very minimal enhancement and normal lymph nodes.Left side looks normal.  She states that Dr. Welton Flakes is going to give her last chemotherapy on January  31. She states Dr. Welton Flakes told her she can have her surgery after that is done,  And she has been advised that she can have the Port-A-Cath removed. We spent a very long time talking about surgical options on the right and on the left. We went over plastic surgery issues. Basically she has decided she wants bilateral mastectomy, stating that she does not want to have another issue on the left side. She is interested in reconstruction, and has been counseled by Dr. Etter Sjogren. She has decided that she will have reconstruction at a later date..  She is scheduled  for a bilateral mastectomy, right axillary lymph node dissection, left SLN biopsy and  Port-A-Cath removal. .   Operative Findings:       On the right side there was a lot of chemotherapy effect with thickening and fibrosis of the breast tissue, but no obvious tumor mass. The right axillary lymph nodes did not appear enlarged. On the left side the breast felt more normal and dissected more normally, and we found 2 sentinel lymph nodes. The  Port-A-Cath was removed uneventfully.  Procedure in Detail:          The patient underwent radionuclide injection of the left breast by the nuclear medicine technician in the holding area. The patient was brought to the operating room and underwent general endotracheal anesthesia. Intravenous antibiotics were given. The neck and chest and upper abdomen were prepped and draped in a sterile fashion. Surgical time out was performed.  I injected 5 cc of blue dye into the left breast, subareolar area. This was 2 cc of methylene blue mixed with 3 cc of saline. The breast was massaged for 5 minutes.  Using a marking pen I marked the inframammary crease and  the midline and marked bilateral mirror image transverse elliptical incisions. I made the left mastectomy incision first. Skin flaps were raised superiorly to the infraclavicular area, medially to the parasternal area, inferiorly to the anterior rectus sheath and  laterally to the latissimus dorsi muscle. The breast was dissected off the pectoralis major and minor muscles. The lateral skin margin was marked with a silk suture. Using the neoprobe I found two sentinel lymph nodes which were very hot and very blue. There were no other sentinel nodes. The 2 SLN's were sent separately. The breast specimen was sent separately. Hemostasis was excellent. Mastectomy wound was packed with saline moistened gauze.  I then made a mirror-image right mastectomy incision. Skin flaps were raised superiorly, medially, inferiorly, and laterally in similar fashion. The right breast was dissected off the pectoralis major and minor muscles. I marked the lateral skin margin with a silk suture. I incised the clavipectoral fascia and retracted the pectoralis major and minor muscles medially. I took the dissection cephalad until I identified the axillary vein. A couple Of venous tributaries which were coming off off of the axillary vein were controlled with metal clips and divided. As I went deeper I identified the thoracodorsal neurovascular bundle and that was working and preserved. I dissected all the level I and level II lymph nodes out. I preserved the long thoracic nerve and thoracodorsal nerve and they were both functioning at the end of the case. The right breast and axillary contents were sent as a single specimen. The right mastectomy wound was irrigated with saline. Hemostasis was excellent and achieved with electrocautery and metal clips.  On  both sides I placed two-19 [place] Blake drains and sutured in with nylon sutures. One  drain  went up into the axilla on each side and one across the skin flaps on each side. Both mastectomy wounds  were closed in 2 layers with interrupted 3-0 Vicryl in the subcutaneous tissue and the skin closed with running subcuticular suture of 4-0 Monocryl and Dermabond.  I then made an incision in the left infraclavicular area overlying the palpable port.  The port was mobilized. The capsule was incised. The Prolene sutures were cut and the port and catheter were removed intact. There was no bleeding. The Port-A-Cath incision was closed with 3-0 Vicryl sutures in the And subcutaneous tissue and 4-0 Monocryl subcuticular sutures in the skin and Dermabond. After all the incisions were painted with Dermabond.   ABD pads and a breast binder were placed. All 4 drains were functioning without any air leak.  The patient was taken to recovery in stable condition. EBL was about 125 cc. Counts correct. Complications none.     Angelia Mould. Derrell Lolling, M.D., FACS General and Minimally Invasive Surgery Breast and Colorectal Surgery  05/18/2012 4:46 PM

## 2012-05-18 NOTE — Interval H&P Note (Signed)
History and Physical Interval Note:  05/18/2012 1:24 PM  Mary [name]  has presented today for surgery, with the diagnosis of bilateral  breast cancer  The goals and thevarious methods of treatment have been discussed with the patient and family. After consideration of risks, benefits and other options for treatment, the patient has consented to  Procedure(s) with comments: MASTECTOMY WITH AXILLARY LYMPH NODE DISSECTION (Right) - Bilateral Total Mastectomy , right axillary lymph node dissection left axillary sentinel node biopsy, removal of Porta Cath SIMPLE MASTECTOMY WITH AXILLARY SENTINEL NODE BIOPSY (Left) REMOVAL PORT-A-CATH (N/A) as a surgical intervention .  The patient's history has been reviewed, patient examined today, no change in status, stable for surgery.  I have reviewed the patient's chart and labs.  Questions were answered to the patient's satisfaction.     Ernestene Mention

## 2012-05-19 LAB — BASIC METABOLIC PANEL
BUN: 9 mg/dL (ref 6–23)
CO2: 25 mEq/L (ref 19–32)
Chloride: 107 mEq/L (ref 96–112)
GFR calc Af Amer: >90 mL/min (ref >90)
Glucose, Bld: 105 mg/dL — ABNORMAL HIGH (ref 70–99)
Potassium: 4.1 mEq/L (ref 3.5–5.1)

## 2012-05-19 LAB — CBC
HCT: 32.8 % — ABNORMAL LOW (ref 36.0–46.0)
Hemoglobin: 11 g/dL — ABNORMAL LOW (ref 12.0–15.0)
MCH: 33.3 pg (ref 26.0–34.0)
MCHC: 33.5 g/dL (ref 30.0–36.0)
MCV: 99.4 fL (ref 78.0–100.0)

## 2012-05-19 NOTE — Progress Notes (Signed)
1 Day Post-Op  Subjective: Awake. Alert. No distress. Pain control adequate. Slept some during the night. Up to bathroom and voiding without difficulty. No nausea. Surgical findings discussed with patient and family.  Morning laboratory pending  Objective: Vital signs in last 24 hours: Temp:  [97.1 F (36.2 C)-98.2 F (36.8 C)] 98.2 F (36.8 C) (03/01 0133) Pulse Rate:  [77-97] 80 (03/01 0133) Resp:  [2-29] 18 (03/01 0133) BP: (124-152)/(53-77) 134/63 mmHg (03/01 0133) SpO2:  [74 %-100 %] 96 % (03/01 0133) Weight:  [164 lb 3.2 oz (74.481 kg)-169 lb 6.4 oz (76.839 kg)] 169 lb 6.4 oz (76.839 kg) (02/28 1809) Last BM Date: 05/18/12  Intake/Output from previous day: 02/28 0701 - 03/01 0700 In: 4050.7 [P.O.:240; I.V.:3810.7] Out: 1435 [Urine:1050; Drains:285; Blood:100] Intake/Output this shift: Total I/O In: 1410.7 [I.V.:1410.7] Out: 975 [Urine:850; Drains:125]  General appearance: alert. Mental status normal. No distress. Afebrile. Normotensive. Breasts:  bilateral mastectomy skin flaps are pink and viable. No hematoma. Port-A-Cath removal site also looks good without hematoma. All drains functioning with thin serosanguineous fluid.  Lab Results:  No results found for this or any previous visit (from the past 24 hour(s)).   Studies/Results: @RISRSLT24 @  .  ceFAZolin (ANCEF) IV  2 g Intravenous Q8H  . cholecalciferol  400 Units Oral Daily  . enoxaparin  40 mg Subcutaneous Q24H  . LORazepam  1.5 mg Oral QHS  . pantoprazole  40 mg Oral Daily     Assessment/Plan: s/p Procedure(s): MASTECTOMY WITH AXILLARY LYMPH NODE DISSECTION SIMPLE MASTECTOMY WITH AXILLARY SENTINEL NODE BIOPSY REMOVAL PORT-A-CATH  POD #1. Doing well. Advance diet and activities. Decrease IV rate Hopefully home tomorrow.  DVT prophylaxis. On Lovenox.  @PROBHOSP @  LOS: 1 day    Sophonie Goforth M. Derrell Lolling, M.D., [facility] General and Minimally invasive Surgery Breast and  Colorectal Surgery Office:   [phone] Pager:   [phone]  05/19/2012  . .prob

## 2012-05-20 MED ORDER — HYDROCODONE-ACETAMINOPHEN 7.5-500 MG PO TABS: 1.0000 | ORAL_TABLET | Freq: Four times a day (QID) | ORAL | Status: DC | PRN

## 2012-05-20 NOTE — Progress Notes (Signed)
Pt for discharge to home today accompanied by husband and mom and dad.  All discharge instructions given, explained and a copy given to the pt.  Rx for vicodin given and explained and talked with pt about pain management.  Breast Cancer bag given and explained.  2 charts for recording JP drains given and explained.  Pt and husband both know how to empty the drains and measure them, and to keep a record to take to Dr. Derrell Lolling upon return to office.  Pt is to call on Monday to make an appt for 3/10 and the pt understands this.   Reviewed support groups, ABC Class once approved by Dr. Derrell Lolling and any questions pt had.  Discussed arm precautions w/ patient and wound care.  Pt wore camisole w/ JP holders home and breast binder.

## 2012-05-20 NOTE — Discharge Summary (Addendum)
Patient ID: Mary [name] [id number] 51 y.o. 06/19/1960  05/18/2012  Discharge date and time: May 20, 2012  Admitting Physician: Ernestene Mention  Discharge Physician: Ernestene Mention  Admission Diagnoses: breast cancer  Discharge Diagnoses: Invasive lobular carcinoma right breast, receptor positive, HER-2-negative, initial clinical stage T3 N1, status post neoadjuvant chemotherapy.                                         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 female. She is admitted electively for surgical management of her bilateral breast cancer. She has completed her neoadjuvant chemotherapy.  She has had a significant positive response in her right breast. She says she really cannot feel the mass anymore and the skin looks normal  She was initially evaluated in the [facility] on 12/14/2011. She had a large mass in the right breast, 8-10 cm, centrally with skin thickening and nipple inversion. She also had calcifications in the upper outer quadrant of the left breast. Biopsy of the right breast showed invasive cancer, probably lobular, receptor positive, HER-2-negative, clinical stage T3, N1. In the left side image guided biopsy showed high-grade DCIS with comedo necrosis, upper outer quadrant, clinical stage Tis, N0. I placed the Port-A-Cath and she has received neoadjuvant chemotherapy. She had positive right axillary lymph node by MRI and by PET CT, but this was never biopsied.  Recent MRI shows dramatic response on the right side with very minimal enhancement and normal lymph nodes.Left side looks normal.  She received her last chemotherapy  on January 31. She states Dr. Welton Flakes told her she can have her surgery after that is done, and she has been advised that she can have the Port-A-Cath removed.  We spent a very long time talking about surgical options on the right and on the left. We went over plastic surgery issues. Basically she has decided she wants bilateral mastectomy, stating that she does not want to have another issue on the left side. She is interested in reconstruction, and has been counseled by Dr. Etter Sjogren. She has decided that she will have reconstruction at a later date..  She is scheduled for a bilateral mastectomy, right axillary lymph node dissection, left SLN biopsy and Port-A-Cath removal. .   Hospital Course: On the day of admission the patient underwent bilateral total mastectomy, complete right axillary lymph node dissection, left axillary sentinel node biopsy, and removal of Port-A-Cath. Pathology is pending at the time of this dictation. She did well postop. Hemoglobin on postop day 1 was 11.0. She progressed in her diet and activities and pain was controlled with hydrocodone. On postop day 2 she felt ready to go home. She was instructed in drain care. The skin flaps and all wounds look very good. She was given a prescription for hydrocodone for pain and instructed to return to see me in 8-9 days for wound and drain check.  Consults: none  Significant Diagnostic Studies: Surgical pathology, pending at the time of dictation  Treatments: surgery: Bilateral total mastectomy, right axillary lymph node dissection, left axillary sentinel node biopsy, removal of Port-A-Cath.  Disposition: Home  Patient  Instructions:    Medication List    TAKE these medications       ALPRAZolam 0.25 MG tablet  Commonly known as:  XANAX  Take 0.25 mg by mouth 3 (three) times daily as needed for anxiety.     b complex vitamins tablet  Take 1 tablet by mouth daily.     CALTRATE 600+D PO  Take 1 tablet by mouth daily.      cholecalciferol 400 UNITS Tabs  Commonly known as:  VITAMIN D  Take 400 Units by mouth daily.     esomeprazole 40 MG capsule  Commonly known as:  NEXIUM  Take 40 mg by mouth daily before breakfast.     HYDROcodone-acetaminophen 7.5-500 MG per tablet  Commonly known as:  LORTAB 7.5  Take 1 tablet by mouth every 6 (six) hours as needed for pain.     loperamide 2 MG tablet  Commonly known as:  IMODIUM A-D  Take 2 mg by mouth 4 (four) times daily as needed for diarrhea or loose stools.     LORazepam 0.5 MG tablet  Commonly known as:  ATIVAN  Take 1.5 mg by mouth at bedtime.     multivitamin tablet  Take 1 tablet by mouth daily.     vitamin B-12 100 MCG tablet  Commonly known as:  CYANOCOBALAMIN  Take 100 mcg by mouth daily.     vitamin C 100 MG tablet  Take 100 mg by mouth daily.     zolpidem 12.5 MG CR tablet  Commonly known as:  AMBIEN CR  Take 1 tablet (12.5 mg total) by mouth at bedtime as needed for sleep.        Activity: she was given detailed, written instructions on activities and restrictions and wound care. Diet: low fat, low cholesterol diet Wound Care: as directed  Follow-up:  With Dr. Derrell Lolling in 1 week.  Signed: Angelia Mould. Derrell Lolling, M.D., FACS General and minimally invasive surgery Breast and Colorectal Surgery  05/20/2012, 6:39 AM

## 2012-05-20 NOTE — Progress Notes (Signed)
2 Days Post-Op  Subjective: Stable. Alert. Pain well-controlled. Ambulating in halls. Tolerating diet. She feels ready to go home.  We spent a long time talking about drain care, shoulder range of motion, followup with me pathology report pending and so forth. We answered all of her questions and she she is very comfortable.  Hemoglobin 11.0   Objective: Vital signs in last 24 hours: Temp:  [98 F (36.7 C)-99.7 F (37.6 C)] 99.7 F (37.6 C) (03/02 0231) Pulse Rate:  [76-92] 82 (03/02 0231) Resp:  [17-18] 18 (03/02 0231) BP: (118-148)/(53-68) 136/64 mmHg (03/02 0231) SpO2:  [96 %-98 %] 96 % (03/02 0231) Last BM Date: 05/18/12  Intake/Output from previous day: 03/01 0701 - 03/02 0700 In: 1586 [P.O.:1020; I.V.:431] Out: 1420 [Urine:1250; Drains:170] Intake/Output this shift: Total I/O In: -  Out: 220 [Urine:150; Drains:70]  General appearance: alert, cooperative, no distress and dental status normal Breasts: , bilateral mastectomy skin flaps looked good. Pink, warm, no necrosis. No hematoma. All drains functioning. Thin serosanguineous.  Lab Results:  Results for orders placed during the hospital encounter of 05/18/12 (from the past 24 hour(s))  BASIC METABOLIC PANEL     Status: Abnormal   Collection Time    05/19/12  7:10 AM      Result Value Range   Sodium 141  135 - 145 mEq/L   Potassium 4.1  3.5 - 5.1 mEq/L   Chloride 107  96 - 112 mEq/L   CO2 25  19 - 32 mEq/L   Glucose, Bld 105 (*) 70 - 99 mg/dL   BUN 9  6 - 23 mg/dL   Creatinine, Ser 5.40  0.50 - 1.10 mg/dL   Calcium 8.7  8.4 - 98.1 mg/dL   GFR calc non Af Amer >90  >90 mL/min   GFR calc Af Amer >90  >90 mL/min  CBC     Status: Abnormal   Collection Time    05/19/12  7:10 AM      Result Value Range   WBC 8.9  4.0 - 10.5 K/uL   RBC 3.30 (*) 3.87 - 5.11 MIL/uL   Hemoglobin 11.0 (*) 12.0 - 15.0 g/dL   HCT 19.1 (*) 47.8 - 29.5 %   MCV 99.4  78.0 - 100.0 fL   MCH 33.3  26.0 - 34.0 pg   MCHC 33.5  30.0 - 36.0  g/dL   RDW 62.1  30.8 - 65.7 %   Platelets 210  150 - 400 K/uL     Studies/Results: @RISRSLT24 @  . cholecalciferol  400 Units Oral Daily  . enoxaparin  40 mg Subcutaneous Q24H  . LORazepam  1.5 mg Oral QHS  . pantoprazole  40 mg Oral Daily     Assessment/Plan: s/p Procedure(s): MASTECTOMY WITH AXILLARY LYMPH NODE DISSECTION SIMPLE MASTECTOMY WITH AXILLARY SENTINEL NODE BIOPSY REMOVAL PORT-A-CATH  POD #2. Doing well. Meets discharge criteria. Discharge home today Prescription for analgesics given Return to office in 8-9 days for wound and drain check Call pathology report Tuesday, hopefully     @PROBHOSP @  LOS: 2 days    Mary [name],HAYWOOD M 05/20/2012  . .prob

## 2012-05-21 ENCOUNTER — Encounter (HOSPITAL_COMMUNITY): Payer: Self-pay | Admitting: General Surgery

## 2012-05-21 ENCOUNTER — Telehealth (INDEPENDENT_AMBULATORY_CARE_PROVIDER_SITE_OTHER): Payer: Self-pay | Admitting: General Surgery

## 2012-05-21 NOTE — Telephone Encounter (Signed)
Patient calling to see if she can change bandages. I spoke with Annabelle Harman who stated she should be able to change them if needed, but patient instructed to re-dress these areas and keep wearing binder. She states she may not change the bandages because they do not look soiled. She needs an appt for a week post surgery. No appts available. Aware I am sending this message to his assistant and she will call with sooner appt.

## 2012-05-22 ENCOUNTER — Telehealth (INDEPENDENT_AMBULATORY_CARE_PROVIDER_SITE_OTHER): Payer: Self-pay | Admitting: General Surgery

## 2012-05-22 NOTE — Telephone Encounter (Signed)
I called and discussed pathology report with patient. To see me in a few days, Dr. Welton Flakes on March 7, and Dr. Dayton Scrape next week.  Angelia Mould. Derrell Lolling, M.D., [facility] General and Minimally invasive Surgery Breast and Colorectal Surgery Office:   [phone] Pager:   [phone]

## 2012-05-25 ENCOUNTER — Telehealth: Payer: Self-pay | Admitting: *Deleted

## 2012-05-25 ENCOUNTER — Other Ambulatory Visit: Payer: 59 | Admitting: Lab

## 2012-05-25 ENCOUNTER — Other Ambulatory Visit: Payer: Self-pay | Admitting: Oncology

## 2012-05-25 ENCOUNTER — Ambulatory Visit: Payer: 59 | Admitting: Oncology

## 2012-05-25 NOTE — Telephone Encounter (Signed)
R/S f/u appt to 05/31/12 at 1:30lab/2:00 Dr. Welton Flakes, d/t the weather.  Confirmed date and time.

## 2012-05-26 ENCOUNTER — Other Ambulatory Visit: Payer: Self-pay | Admitting: Oncology

## 2012-05-28 ENCOUNTER — Ambulatory Visit (INDEPENDENT_AMBULATORY_CARE_PROVIDER_SITE_OTHER): Payer: Commercial Managed Care - PPO | Admitting: General Surgery

## 2012-05-28 ENCOUNTER — Encounter (INDEPENDENT_AMBULATORY_CARE_PROVIDER_SITE_OTHER): Payer: Self-pay | Admitting: General Surgery

## 2012-05-28 ENCOUNTER — Other Ambulatory Visit: Payer: Self-pay | Admitting: Oncology

## 2012-05-28 VITALS — BP 132/84 | HR 68 | Temp 98.4°F | Resp 16 | Ht 64.0 in | Wt 160.6 lb

## 2012-05-28 DIAGNOSIS — C50919 Malignant neoplasm of unspecified site of unspecified female breast: Secondary | ICD-10-CM

## 2012-05-28 DIAGNOSIS — C50411 Malignant neoplasm of upper-outer quadrant of right female breast: Secondary | ICD-10-CM

## 2012-05-28 DIAGNOSIS — C50419 Malignant neoplasm of upper-outer quadrant of unspecified female breast: Secondary | ICD-10-CM

## 2012-05-28 DIAGNOSIS — C50912 Malignant neoplasm of unspecified site of left female breast: Secondary | ICD-10-CM

## 2012-05-28 NOTE — Progress Notes (Signed)
Patient ID: Mary [name], female   DOB: 1961-03-05, 52 y.o.   MRN: [id number] History: This patient underwent neoadjuvant chemotherapy, and then underwent surgery on 05/18/2012. She underwent bilateral total mastectomy, right axillary lymph node dissection, and left sentinel node biopsy. We also removed her Port-A-Cath. The right breast showed invasive lobular carcinoma, 2.3 cm, 8/17 lymph nodes positive, stage T2, N2a. Left breast showed ductal carcinoma in situ, sentinel node negative, stage Tis, N0. She is doing well. Still draining over 20 cc per day from all drains except the left axilla is slowing down. Still taking pain medicine the sleeping well at night.  Exam: Patient looks well. No distress. Family is with her. Bilateral mastectomy incisions are healing uneventfully. No infection or skin necrosis. No seroma or hematoma.  I removed the left axillary drain and  redressed the wounds She has pretty good range of motion of both shoulders but is restricting abduction to 90 as instructed.  Assessment: Invasive lobular carcinoma right breast, stage ypT2, ypN2a. Carcinoma left breast.  Stage ypTis, ypNo.    doing well in the early postop period following bilateral total mastectomy, right axillary lymph node dissection,left SLN biopsy, remove port.  Plan:: Wound care and activities discussed. Pathology discussed and copy given to patient Return to see me one week for drain and wound care She will see Dr. Drue Second Dr. Lillia Corporal this week She is interested in delayed reconstruction, And she has discussed this with Dr. Etter Sjogren preop.   Angelia Mould. Derrell Lolling, M.D., [facility] General and Minimally invasive Surgery Breast and Colorectal Surgery Office:   [phone] Pager:   [phone]

## 2012-05-28 NOTE — Patient Instructions (Signed)
Your mastectomy incisions look good. There is no sign of any infection, bleeding, or fluid collection. We removed one of the 4 drains.  You may lift 10 pounds in either hand. Keep the wounds clean and dry.  Continue to keep a written record of the drainage and bring to the office with you next week.  Dr. Welton Flakes and Dr. Dayton Scrape will discuss future treatments with you during your appointments within this week.

## 2012-05-29 ENCOUNTER — Encounter: Payer: Self-pay | Admitting: Oncology

## 2012-05-30 ENCOUNTER — Encounter: Payer: Self-pay | Admitting: Oncology

## 2012-05-31 ENCOUNTER — Encounter: Payer: Self-pay | Admitting: Radiation Oncology

## 2012-05-31 ENCOUNTER — Ambulatory Visit: Payer: 59

## 2012-05-31 ENCOUNTER — Encounter: Payer: Self-pay | Admitting: Oncology

## 2012-05-31 ENCOUNTER — Other Ambulatory Visit (HOSPITAL_BASED_OUTPATIENT_CLINIC_OR_DEPARTMENT_OTHER): Payer: 59 | Admitting: Lab

## 2012-05-31 ENCOUNTER — Telehealth: Payer: Self-pay | Admitting: Oncology

## 2012-05-31 ENCOUNTER — Ambulatory Visit
Admission: RE | Admit: 2012-05-31 | Discharge: 2012-05-31 | Disposition: A | Discharge status: Home / Self Care | Payer: 59 | Source: Ambulatory Visit | Attending: Radiation Oncology | Admitting: Radiation Oncology

## 2012-05-31 ENCOUNTER — Ambulatory Visit (HOSPITAL_BASED_OUTPATIENT_CLINIC_OR_DEPARTMENT_OTHER): Payer: 59 | Admitting: Oncology

## 2012-05-31 ENCOUNTER — Institutional Professional Consult (permissible substitution): Payer: 59 | Admitting: Radiation Oncology

## 2012-05-31 VITALS — BP 161/93 | HR 67 | Temp 98.0°F | Wt 161.6 lb

## 2012-05-31 VITALS — BP 160/92 | HR 66 | Temp 97.8°F | Resp 20 | Ht 64.0 in | Wt 161.7 lb

## 2012-05-31 DIAGNOSIS — C50411 Malignant neoplasm of upper-outer quadrant of right female breast: Secondary | ICD-10-CM

## 2012-05-31 DIAGNOSIS — C50419 Malignant neoplasm of upper-outer quadrant of unspecified female breast: Secondary | ICD-10-CM

## 2012-05-31 DIAGNOSIS — D0590 Unspecified type of carcinoma in situ of unspecified breast: Secondary | ICD-10-CM

## 2012-05-31 DIAGNOSIS — C50912 Malignant neoplasm of unspecified site of left female breast: Secondary | ICD-10-CM

## 2012-05-31 DIAGNOSIS — C50919 Malignant neoplasm of unspecified site of unspecified female breast: Secondary | ICD-10-CM | POA: Insufficient documentation

## 2012-05-31 LAB — CBC WITH DIFFERENTIAL/PLATELET
Basophils Absolute: 0 10*3/uL (ref 0.0–0.1)
HCT: 35.9 % (ref 34.8–46.6)
HGB: 12 g/dL (ref 11.6–15.9)
LYMPH%: 12.7 % — ABNORMAL LOW (ref 14.0–49.7)
MCH: 32.7 pg (ref 25.1–34.0)
MONO#: 0.5 10*3/uL (ref 0.1–0.9)
NEUT%: 73.7 % (ref 38.4–76.8)
Platelets: 334 10*3/uL (ref 145–400)
WBC: 7.4 10*3/uL (ref 3.9–10.3)
lymph#: 0.9 10*3/uL (ref 0.9–3.3)

## 2012-05-31 LAB — COMPREHENSIVE METABOLIC PANEL (CC13)
ALT: 29 U/L (ref 0–55)
AST: 23 U/L (ref 5–34)
Albumin: 3.4 g/dL — ABNORMAL LOW (ref 3.5–5.0)
Alkaline Phosphatase: 95 U/L (ref 40–150)
BUN: 17.2 mg/dL (ref 7.0–26.0)
CO2: 27 mEq/L (ref 22–29)
Chloride: 105 mEq/L (ref 98–107)
Glucose: 107 mg/dl — ABNORMAL HIGH (ref 70–99)
Total Bilirubin: 0.29 mg/dL (ref 0.20–1.20)
Total Protein: 6.6 g/dL (ref 6.4–8.3)

## 2012-05-31 NOTE — Progress Notes (Signed)
OFFICE PROGRESS NOTE  CC  Mary [name],TERESA, MD [address] [place] [address] Dr. Chipper Herb Dr. Jane Canary  DIAGNOSIS: 52 year old female with diagnosis of stage IIIA T3, N1, M0 invasive lobular carcinoma, Of the right breast with DCIS of the the left breast. Essentially patient has bilateral breast cancers.  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   #2 patient has begun FEC 100 starting on 12/08/2011 - 01/18/12. A total of 4 cycles of this chemotherapy is planned and then thereafter she will proceed with Taxol as a single agent for total of 12 weeks.  #3 patient is status post single agent Taxol x12 weeks from 02/02/2012 through January 2014.  #4patient is status post bilateral mastectomies performed on 05/18/2012. She had a right axillary lymph node dissection and left sentinel lymph node biopsy. Her port was removed also. Her final pathology on the right revealed invasive  Lobular carcinoma measuring 2.3 cm 8 of 17 lymph nodes were positive (T2 N2A). 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  CURRENT THERAPY Referred to radiation oncology  INTERVAL HISTORY: AMYLAH WILL 52 y.o. female returns for follow-up visit today.she has had her bilateral mastectomies. Postoperatively she seems to be doing well and is without any problems. She is having some pain. She otherwise denies any nausea vomiting fevers chills night sweats headaches no shortness of breath or chest pains or palpitations. No myalgias and arthralgias. Remainder of the 10 point review of systems is negative.  MEDICAL HISTORY: Past Medical History  Diagnosis Date  . Irritable bowel syndrome   . Dental crowns present     x 4  . Neutropenia, drug-induced 12/15/2011  . Breast cancer August 2013    bilateral,  er/pr+  . Complication of anesthesia     pt states she had the shakes extremely bad  . Cough     at night d/t reflux;nothing productive  . History of bronchitis     08/2011  . GERD (gastroesophageal reflux disease)     takes Nexium daily  . IBS (irritable bowel syndrome)     immodium prn  . History of colon polyps   . Urinary frequency   . Nocturia   . Insomnia     takes Ambien nightly  . Anxiety     takes Xanax prn  . Plantar fasciitis     ALLERGIES:  is allergic to adhesive.  MEDICATIONS:  Current Outpatient Prescriptions  Medication Sig Dispense Refill  . ALPRAZolam (XANAX) 0.25 MG tablet Take 0.25 mg by mouth 3 (three) times daily as needed for anxiety.       . Ascorbic Acid (VITAMIN C) 100 MG tablet Take 100 mg by mouth daily.        Marland Kitchen b complex vitamins tablet Take 1 tablet by mouth daily.       . Calcium Carbonate-Vitamin D (CALTRATE 600+D PO) Take 1 tablet by mouth daily.      . cholecalciferol (VITAMIN D) 400 UNITS TABS Take 400 Units by mouth daily.       Marland Kitchen esomeprazole (NEXIUM) 40 MG capsule Take 40 mg by mouth daily before breakfast.      . HYDROcodone-acetaminophen (LORTAB 7.5) 7.5-500 MG per tablet Take 1 tablet by mouth every 6 (six) hours as needed for pain.  30 tablet  1  .  Multiple Vitamin (MULTIVITAMIN) tablet Take 1 tablet by mouth daily.        . polyethylene glycol (MIRALAX / GLYCOLAX) packet Take 17 g by mouth as needed.      . vitamin B-12 (CYANOCOBALAMIN) 100 MCG tablet Take 100 mcg by mouth daily.       Marland Kitchen loperamide (IMODIUM A-D) 2 MG tablet Take 2 mg by mouth 4 (four) times daily as needed for diarrhea or loose stools.       Marland Kitchen LORazepam (ATIVAN) 0.5 MG tablet TAKE ONE TABLET BY MOUTH EVERY 8 HOURS  60 tablet  0  . zolpidem (AMBIEN CR) 12.5 MG CR tablet Take 1 tablet (12.5 mg total) by mouth at bedtime as needed for sleep.  30 tablet  0   No current facility-administered medications for this visit.    SURGICAL HISTORY:  Past Surgical History   Procedure Laterality Date  . Portacath placement  12/02/2011    Procedure: INSERTION PORT-A-CATH;  Surgeon: Ernestene Mention, MD;  Location: [facility];  Service: General;  Laterality: N/A;  insertion port a cath with fluoroscopy  . Breast lumpectomy  2008    left  . Colonoscopy    . Mastectomy with axillary lymph node dissection Right 05/18/2012    Procedure: MASTECTOMY WITH AXILLARY LYMPH NODE DISSECTION;  Surgeon: Ernestene Mention, MD;  Location: [facility];  Service: General;  Laterality: Right;  Bilateral Total Mastectomy , right axillary lymph node dissection left axillary sentinel node biopsy, removal of Porta Cath  . Simple mastectomy with axillary sentinel node biopsy Left 05/18/2012    Procedure: SIMPLE MASTECTOMY WITH AXILLARY SENTINEL NODE BIOPSY;  Surgeon: Ernestene Mention, MD;  Location: [facility] OR;  Service: General;  Laterality: Left;  . Port-a-cath removal N/A 05/18/2012    Procedure: REMOVAL PORT-A-CATH;  Surgeon: Ernestene Mention, MD;  Location: [facility] OR;  Service: General;  Laterality: N/A;    REVIEW OF SYSTEMS:   General: fatigue (+), night sweats (-), fever (-), pain (-) Lymph: palpable nodes (-) HEENT: vision changes (-), mucositis (-), gum bleeding (-), epistaxis (-) Cardiovascular: chest pain (-), palpitations (-) Pulmonary: shortness of breath (-), dyspnea on exertion (-), cough (-), hemoptysis (-) GI:  Early satiety (-), melena (-), dysphagia (-), nausea/vomiting (-), diarrhea (-) GU: dysuria (-), hematuria (-), incontinence (-) Musculoskeletal: joint swelling (-), joint pain (-), back pain (-) Neuro: weakness (-), numbness (-), headache (-), confusion (-) Skin: Rash (-), lesions (-), dryness (-) Psych: depression (-), suicidal/homicidal ideation (-), feeling of hopelessness (-)   PHYSICAL EXAMINATION:  BP 160/92  Pulse 66  Temp(Src) 97.8 F (36.6 C) (Oral)  Resp 20  Ht 5\' 4"  (1.626 m)  Wt 161 lb 11.2 oz (73.347 kg)  BMI 27.74 kg/m2 General: Patient is  a well appearing female in no acute distress HEENT: PERRLA, sclerae anicteric no conjunctival pallor, MMM Neck: supple, no palpable adenopathy Lungs: clear to auscultation bilaterally, no wheezes, rhonchi, or rales Cardiovascular: regular rate rhythm, S1, S2, no murmurs, rubs or gallops Abdomen: Soft, non-tender, non-distended, normoactive bowel sounds, no HSM Extremities: warm and well perfused, no clubbing, cyanosis, or edema Skin: No lesions or rash Neuro: Non-focal Breast:status post bilateral mastectomies with JP drains in place.  ECOG PERFORMANCE STATUS: 0 - Asymptomatic   LABORATORY DATA: Lab Results  Component Value Date   WBC 7.4 05/31/2012   HGB 12.0 05/31/2012   HCT 35.9 05/31/2012   MCV 97.7 05/31/2012   PLT 334 05/31/2012  Chemistry      Component Value Date/Time   NA 141 05/19/2012 0710   NA 140 04/25/2012 1049   K 4.1 05/19/2012 0710   K 4.0 04/25/2012 1049   CL 107 05/19/2012 0710   CL 105 04/25/2012 1049   CO2 25 05/19/2012 0710   CO2 23 04/25/2012 1049   BUN 9 05/19/2012 0710   BUN 15.4 04/25/2012 1049   CREATININE 0.77 05/19/2012 0710   CREATININE 0.8 04/25/2012 1049   CREATININE 0.82 05/20/2011 1204      Component Value Date/Time   CALCIUM 8.7 05/19/2012 0710   CALCIUM 10.4 04/25/2012 1049   ALKPHOS 93 04/25/2012 1049   ALKPHOS 80 05/20/2011 1204   AST 16 04/25/2012 1049   AST 21 05/20/2011 1204   ALT 21 04/25/2012 1049   ALT 29 05/20/2011 1204   BILITOT 0.35 04/25/2012 1049   BILITOT 0.3 05/20/2011 1204    ADDITIONAL INFORMATION: 4. CHROMOGENIC IN-SITU HYBRIDIZATION (BLOCK 4C) Interpretation HER-2/NEU BY CISH - NO AMPLIFICATION OF HER-2 DETECTED. THE RATIO OF HER-2: CEP 17 SIGNALS WAS 1.36. Reference range: Ratio: HER2:CEP17 < 1.8 - gene amplification not observed Ratio: HER2:CEP 17 1.8-2.2 - equivocal result Ratio: HER2:CEP17 > 2.2 - gene amplification observed CHROMOGENIC IN-SITU HYBRIDIZATION (BLOCK 4E) Interpretation HER-2/NEU BY CISH - NO AMPLIFICATION OF HER-2 DETECTED.  THE RATIO OF HER-2: CEP 17 SIGNALS WAS 0.90. Reference range: Ratio: HER2:CEP17 < 1.8 - gene amplification not observed Ratio: HER2:CEP 17 1.8-2.2 - equivocal result Ratio: HER2:CEP17 > 2.2 - gene amplification observed Pecola Leisure MD Pathologist, Electronic Signature ( Signed 05/28/2012) FINAL DIAGNOSIS Diagnosis 1. Lymph node, sentinel, biopsy, Left axilla #1 - ONE LYMPH NODE, NEGATIVE FOR TUMOR (0/1). 1 of 5 FINAL for LIND, AUSLEY A [phone]) Diagnosis(continued) - SEE COMMENT. 2. Lymph node, sentinel, biopsy, Left axilla #2 - ONE LYMPH NODE, NEGATIVE FOR TUMOR (0/1). - SEE COMMENT. 3. Breast, simple mastectomy, Left - DUCTAL CARCINOMA IN SITU, SEE COMMENT. - IN SITU CARCINOMA IS 1.5 CM FROM THE NEAREST MARGIN (DEEP). - ONE LYMPH NODE, NEGATIVE FOR TUMOR (0/1). - SEE TUMOR SYNOPTIC TEMPLATE BELOW. 4. Breast, simple mastectomy, Right, with axillary contents - INVASIVE LOBULAR CARCINOMA (2.3 CM), SEE COMMENT. - INVASIVE TUMOR IS 2.8 CM FROM THE NEAREST MARGIN (DEEP). - BENIGN NIPPLE, NEGATIVE FOR TUMOR. - LOBULAR CARCINOMA IN SITU. - EIGHT LYMPH NODES, POSITIVE FOR METASTATIC MAMMARY CARCINOMA (8/17). - NO EXTRACAPSULAR TUMOR EXTENSION IDENTIFIED. - SEE TUMOR SYNOPTIC TEMPLATE BELOW. 5. Lymph nodes, regional resection, Additional Right Axillary - ONE LYMPH NODE, POSITIVE FOR ISOLATED TUMOR CELLS (0/1) SEE COMMENT Microscopic Comment 1. and 2. There are no intranodal metastatic tumor deposits identified with cytokeratin AE1/AE3 immunostain. 3. BREAST, IN SITU CARCINOMA Specimen, including laterality: Left breast. Procedure: Simple mastectomy. Grade of carcinoma: II (grading limited by neoadjuvant-related change). Necrosis: Absent. Estimated tumor size: (glass slide measurement): 0.3 cm. Treatment effect: Present. If present, treatment effect in breast tissue, lymph nodes or both: Breast tissue. Distance to closest margin: 1.5 cm. If margin positive, focally or  broadly: N/A. Breast prognostic profile: Estrogen and progesterone receptor: Not repeated; previous study demonstrated 81% for estrogen and 74% for progesterone ([id number]). Lymph nodes: Examined: 3 (Parts 1, 2 and 3). Lymph nodes with metastasis: 0. TNM: ypTis, pN0. 4. BREAST, INVASIVE TUMOR, WITH LYMPH NODE SAMPLING Specimen, including laterality: Right breast. Procedure: Simple mastectomy. Grade: II of III. Tubule formation: 3. Nuclear pleomorphism: 2. Mitotic: 1. Tumor size (gross measurement): 2.3 cm. Margins: Invasive, distance to closest margin: 1.5 cm. In situ,  distance to closest margin: 1.5 cm (deep). If margin positive, focally or broadly: None. Lymphovascular invasion: Absent. 2 of 5 FINAL for SYMANTHA, STEEBER A [phone]) Microscopic Comment(continued) Ductal carcinoma in situ: None. Grade: N/A. Extensive intraductal component: N/A. Lobular neoplasia: Present, in situ carcinoma. Tumor focality: Unifocal. Treatment effect: Present. If present, treatment effect in breast tissue, lymph nodes or both: Both Extent of tumor: Skin: Negative. Nipple: Negative. Skeletal muscle: Negative. Lymph nodes: # examined: 18 (Parts 4 and 5). Lymph nodes with metastasis: 8 Isolated tumor cells (< 0.2 mm): 5 see comment Micrometastasis: (> 0.2 mm and < 2.0 mm): 0. Macrometastasis: (> 2.0 mm): 0.2 cm, 0.8 cm, 0.3 cm, 0.3 cm, 0.3 cm, 0.9 cm, and 1.3 cm. Extracapsular extension: Absent. Breast prognostic profile: Estrogen and Progesterone receptor: Not repeated; previous study demonstrated 95% for estrogen and 53 % for progesterone ([id number]). Ki-67: Not repeated; previous study demonstrated 85% proliferation rate ([id number]). HER-2/neu: Repeated; previous study demonstrated no amplification (1.21) ([id number]). Non-neoplastic breast: Multiple complex sclerosing lesions/radial scars, fibroadenomatoid nodules, fibrocystic change, and microcalcifications in benign ducts and  lobules. TNM: ypT2, pN2a, pMX. (CRR:eps 05/21/12) Comment: There are isolated tumor cells present in a total of five lymph nodes from part 4 (slides 4K, 4R, and 81M) and part 5 (Slide 5A). The isolated tumor cells were identified with cytokeratin AE1/3 immunostain. 5. The isolated tumor cells were identified with cytokeratin AE1/3 immunostain.    RADIOGRAPHIC STUDIES:  ASSESSMENT: 51 year old female with  #1 T3, N1, 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 Patient is now going to receive neoadjuvant chemotherapy consisting of FEC 100. She will receive dose dense FEC with day 2 Neulasta. She is overall doing well she understands the rationale for neoadjuvant treatment she understands the side effects of her chemotherapy. She also has her antiemetics already at her disposal to take as directed.  #2 patient has now completed 4 cycles of neoadjuvant FEC 100.  #3 she received single agent Taxol weekly from  02/02/2012 hrough 04/20/2012..    #4 patient is status post bilateral mastectomies performed on 05/18/2012. She had a right axillary lymph node dissection and left sentinel lymph node biopsy. Her port was removed also. Her final pathology on the right revealed invasive  Lobular carcinoma measuring 2.3 cm 8 of 17 lymph nodes were positive (T2 N2A). 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  PLAN:  #1 patient will be referred to radiation oncology for post mastectomy radiation.  #2 once she completes radiation therapy she will begin tamoxifen 20 mg daily for a total of 10 years.  #3 I will see her back after completion of radiation therapy.  All questions were answered. The patient knows to call the clinic with any problems, questions or concerns. We can certainly see the patient much sooner if necessary.  I spent 15 minutes counseling the patient face to face. The total time spent in the appointment was 30  minutes.

## 2012-05-31 NOTE — Progress Notes (Signed)
CC Dr. Claud Kelp, Dr. Drue Second, Dr. Duncan Dull  Followup note:  Diagnosis: Clinical stage IIIA (T3, N1, M0), pathologic stage IIIA (ypT2, ypN2a, M0) invasive lobular carcinoma of the right breast. Also history of stage 0 (Tis N0 M0) DCIS of the left breast.  [name] is a most pleasant 52 year old female who is seen today for discussion of post mastectomy radiation therapy in the management of her stage IIIA invasive lobular carcinoma of the right breast. I first saw her at the [facility] back on 11/23/2011 when she presented with a 7-8 cm right breast mass between 10 and 1:00 along with right axillary lymphadenopathy. She is was diagnosed with invasive lobular carcinoma. She also had left breast calcifications along the upper-outer quadrant of the left breast diagnostic for high-grade DCIS. She underwent neoadjuvant chemotherapy with Dr. Welton Flakes and had marked regression of her primary tumor. On 05/18/2012 she underwent a left simple mastectomy and sentinel lymph node biopsy along with a right modified radical mastectomy. On the left she had DCIS with 2 negative sentinel lymph nodes. On the right she had residual invasive lobular carcinoma measuring 2.3 cm with 8 of 17 lymph nodes containing metastatic disease. An additional lymph node was positive for isolated tumor cells. Treatment effect was present. The surgical margins were widely negative. There was no extracapsular extension within the 8 involved lymph nodes. All were macro metastases. She is doing well postoperatively and is already had one drain removed. She saw Dr. Welton Flakes earlier today.  Physical examination: Alert and oriented. Wt Readings from Last 3 Encounters:  05/31/12 161 lb 9.6 oz (73.301 kg)  05/31/12 161 lb 11.2 oz (73.347 kg)  05/28/12 160 lb 9.6 oz (72.848 kg)   Temp Readings from Last 3 Encounters:  05/31/12 98 F (36.7 C)   05/31/12 97.8 F (36.6 C) Oral  05/28/12 98.4 F (36.9 C) Temporal   BP Readings from Last 3  Encounters:  05/31/12 161/93  05/31/12 160/92  05/28/12 132/84   Pulse Readings from Last 3 Encounters:  05/31/12 67  05/31/12 66  05/28/12 68   Head and neck examination: Remarkable for alopecia. Nodes: Without palpable cervical, supraclavicular, or axillary lymphadenopathy. Chest: Bilateral mastectomies with both wounds healing well. She does have 2 drains. Also from her left anterior Port-A-Cath. Lungs are clear. Heart: Regular in rhythm. Abdomen: Without hepatomegaly. Extremities: There is no upper extremity lymphedema.  Laboratory data: Lab Results  Component Value Date   WBC 7.4 05/31/2012   HGB 12.0 05/31/2012   HCT 35.9 05/31/2012   MCV 97.7 05/31/2012   PLT 334 05/31/2012    Impression: Pathologic stage  IIIA (ypT2, ypN2a, M0) invasive mammary carcinoma of the right breast. She is a candidate for post mastectomy radiation therapy. Her extensive lymph node involvement places her at significant risk for local regional recurrence. This can be decreased by radiation therapy to her right chest wall and regional lymph nodes. We discussed the potential acute and late toxicities of radiation therapy. She'll begin physical therapy in the near future. I'll have her return for simulation/treatment planning in early April. Consent is obtained today. Following completion of radiation therapy she will received oral antiestrogen therapy.  Plan: As discussed above.  40 minutes was spent face-to-face with the patient, primarily counseling the patient and coordinating her care.

## 2012-05-31 NOTE — Progress Notes (Signed)
Ms. Emigh blood pressure was 161/100 in her right lower leg.  It was retaken and was 161/93.  Dr. Dayton Scrape notified.

## 2012-05-31 NOTE — Telephone Encounter (Signed)
gv pt appt schedule for March thru November.

## 2012-05-31 NOTE — Patient Instructions (Addendum)
Proceed with zoladex q monthly  I will see you back in 2 months

## 2012-05-31 NOTE — Progress Notes (Signed)
Please see the Nurse Progress Note in the MD Initial Consult Encounter for this patient. 

## 2012-05-31 NOTE — Progress Notes (Signed)
Mary [name] is here for a consult visit.  She denies pain at this time.  She does have 3 drains in place from left masectomy done on 05/18/2012.

## 2012-06-04 ENCOUNTER — Ambulatory Visit (INDEPENDENT_AMBULATORY_CARE_PROVIDER_SITE_OTHER): Payer: Commercial Managed Care - PPO | Admitting: General Surgery

## 2012-06-04 ENCOUNTER — Encounter (INDEPENDENT_AMBULATORY_CARE_PROVIDER_SITE_OTHER): Payer: Self-pay | Admitting: General Surgery

## 2012-06-04 ENCOUNTER — Ambulatory Visit (HOSPITAL_BASED_OUTPATIENT_CLINIC_OR_DEPARTMENT_OTHER): Payer: 59

## 2012-06-04 VITALS — BP 132/68 | HR 79 | Temp 98.0°F | Resp 20

## 2012-06-04 VITALS — BP 126/64 | HR 76 | Temp 97.0°F | Resp 16 | Ht 64.0 in | Wt 161.0 lb

## 2012-06-04 DIAGNOSIS — C50419 Malignant neoplasm of upper-outer quadrant of unspecified female breast: Secondary | ICD-10-CM

## 2012-06-04 DIAGNOSIS — C50411 Malignant neoplasm of upper-outer quadrant of right female breast: Secondary | ICD-10-CM

## 2012-06-04 DIAGNOSIS — C50912 Malignant neoplasm of unspecified site of left female breast: Secondary | ICD-10-CM

## 2012-06-04 DIAGNOSIS — C50919 Malignant neoplasm of unspecified site of unspecified female breast: Secondary | ICD-10-CM

## 2012-06-04 DIAGNOSIS — D0590 Unspecified type of carcinoma in situ of unspecified breast: Secondary | ICD-10-CM

## 2012-06-04 DIAGNOSIS — Z5111 Encounter for antineoplastic chemotherapy: Secondary | ICD-10-CM

## 2012-06-04 MED ORDER — GOSERELIN ACETATE 3.6 MG ~~LOC~~ IMPL
3.6000 mg | DRUG_IMPLANT | Freq: Once | SUBCUTANEOUS | Status: AC
  Administered 2012-06-04: 3.6 mg via SUBCUTANEOUS
  Filled 2012-06-04: qty 3.6

## 2012-06-04 NOTE — Progress Notes (Signed)
Patient ID: Mary [name], female   DOB: 12/19/1960, 52 y.o.   MRN: [id number] History: This patient returns for a wound and drain check. Initially evaluated in [facility] for locally advanced cancer of the right breast, upper outer quadrant. 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 Neoadjuvant chemotherapy was given. 05/18/2012 she underwent right modified radical mastectomy, left total mastectomy and sentinel node biopsy and removal of Port-A-Cath. Final pathology on the right showed invasive lobular carcinoma, ypT2, yp N2a., 8 of 17 lymph nodes positive;   there was good downstaging of the primary tumor. The left side showed DCIS. She has seen Dr. Dayton Scrape who plans adjuvant radiation therapy starting in mid April. She is doing fairly well. Drainage on the left side has subsided. Still draining a moderate amount on the right.  Exam: Patient looks well. Husband is with her. No distress. Bilateral mastectomy incisions are healing nicely. No fluid collections or infection. No skin necrosis. I removed the remaining drain on the left and left  the other drains in place. Wounds redressed  Assessment receptor positive HER-2 negative invasive lobular carcinoma right breast, pathologic stage ypT2, ypN2a, 8/17 nodes positive DCIS left breast Healing uneventfully following bilateral mastectomy and Port-A-Cath removal  Plan: Return to see me in one week Sure exercises discussed. We will get more aggressive with this after the drainage of the right removed   [facility]. Derrell Lolling, M.D., [facility] General and Minimally invasive Surgery Breast and Colorectal Surgery Office:   [phone] Pager:   [phone]

## 2012-06-04 NOTE — Patient Instructions (Signed)
Goserelin injection What is this medicine? GOSERELIN (GOE se rel in) is similar to a hormone found in the body. It lowers the amount of sex hormones that the body makes. Men will have lower testosterone levels and women will have lower estrogen levels while taking this medicine. In men, this medicine is used to treat prostate cancer; the injection is either given once per month or once every 12 weeks. A once per month injection (only) is used to treat women with endometriosis, dysfunctional uterine bleeding, or advanced breast cancer. This medicine may be used for other purposes; ask your health care provider or pharmacist if you have questions. What should I tell my health care provider before I take this medicine? They need to know if you have any of these conditions (some only apply to women): -diabetes -heart disease or previous heart attack -high blood pressure -high cholesterol -kidney disease -osteoporosis or low bone density -problems passing urine -spinal cord injury -stroke -tobacco smoker -an unusual or allergic reaction to goserelin, hormone therapy, other medicines, foods, dyes, or preservatives -pregnant or trying to get pregnant -breast-feeding How should I use this medicine? This medicine is for injection under the skin. It is given by a health care professional in a hospital or clinic setting. Men receive this injection once every 4 weeks or once every 12 weeks. Women will only receive the once every 4 weeks injection. Talk to your pediatrician regarding the use of this medicine in children. Special care may be needed. Overdosage: If you think you have taken too much of this medicine contact a poison control center or emergency room at once. NOTE: This medicine is only for you. Do not share this medicine with others. What if I miss a dose? It is important not to miss your dose. Call your doctor or health care professional if you are unable to keep an appointment. What may  interact with this medicine? -female hormones like estrogen -herbal or dietary supplements like black cohosh, chasteberry, or DHEA -female hormones like testosterone -prasterone This list may not describe all possible interactions. Give your health care provider a list of all the medicines, herbs, non-prescription drugs, or dietary supplements you use. Also tell them if you smoke, drink alcohol, or use illegal drugs. Some items may interact with your medicine. What should I watch for while using this medicine? Visit your doctor or health care professional for regular checks on your progress. Your symptoms may appear to get worse during the first weeks of this therapy. Tell your doctor or healthcare professional if your symptoms do not start to get better or if they get worse after this time. Your bones may get weaker if you take this medicine for a long time. If you smoke or frequently drink alcohol you may increase your risk of bone loss. A family history of osteoporosis, chronic use of drugs for seizures (convulsions), or corticosteroids can also increase your risk of bone loss. Talk to your doctor about how to keep your bones strong. This medicine should stop regular monthly menstration in women. Tell your doctor if you continue to menstrate. Women should not become pregnant while taking this medicine or for 12 weeks after stopping this medicine. Women should inform their doctor if they wish to become pregnant or think they might be pregnant. There is a potential for serious side effects to an unborn child. Talk to your health care professional or pharmacist for more information. Do not breast-feed an infant while taking this medicine. Men should   inform their doctors if they wish to father a child. This medicine may lower sperm counts. Talk to your health care professional or pharmacist for more information. What side effects may I notice from receiving this medicine? Side effects that you should  report to your doctor or health care professional as soon as possible: -allergic reactions like skin rash, itching or hives, swelling of the face, lips, or tongue -bone pain -breathing problems -changes in vision -chest pain -feeling faint or lightheaded, falls -fever, chills -pain, swelling, warmth in the leg -pain, tingling, numbness in the hands or feet -swelling of the ankles, feet, hands -trouble passing urine or change in the amount of urine -unusually high or low blood pressure -unusually weak or tired Side effects that usually do not require medical attention (report to your doctor or health care professional if they continue or are bothersome): -change in sex drive or performance -changes in breast size in both males and females -changes in emotions or moods -headache -hot flashes -irritation at site where injected -loss of appetite -skin problems like acne, dry skin -vaginal dryness This list may not describe all possible side effects. Call your doctor for medical advice about side effects. You may report side effects to FDA at 1-800-FDA-1088. Where should I keep my medicine? This drug is given in a hospital or clinic and will not be stored at home. NOTE: This sheet is a summary. It may not cover all possible information. If you have questions about this medicine, talk to your doctor, pharmacist, or health care provider.  2013, [employer]. (07/22/2008 1:28:29 PM)  

## 2012-06-04 NOTE — Patient Instructions (Signed)
You are healing and recovering from your bilateral mastectomy without any obvious surgical complications. Both drains on the left side have been removed and you may sponge bathe that area.  We have left the drains on the right side in for another week. Keep that area clean and dry.  Be sure to move your shoulders around as instructed. You will be referred to physical therapy after we get the drains on the right side out.  Return to see Dr. Derrell Lolling in one week.

## 2012-06-12 ENCOUNTER — Encounter (INDEPENDENT_AMBULATORY_CARE_PROVIDER_SITE_OTHER): Payer: Self-pay | Admitting: General Surgery

## 2012-06-12 ENCOUNTER — Ambulatory Visit (INDEPENDENT_AMBULATORY_CARE_PROVIDER_SITE_OTHER): Payer: Commercial Managed Care - PPO | Admitting: General Surgery

## 2012-06-12 VITALS — BP 122/64 | HR 72 | Temp 98.1°F | Resp 16 | Ht 64.0 in | Wt 160.0 lb

## 2012-06-12 DIAGNOSIS — C50411 Malignant neoplasm of upper-outer quadrant of right female breast: Secondary | ICD-10-CM

## 2012-06-12 DIAGNOSIS — C50919 Malignant neoplasm of unspecified site of unspecified female breast: Secondary | ICD-10-CM

## 2012-06-12 DIAGNOSIS — C50419 Malignant neoplasm of upper-outer quadrant of unspecified female breast: Secondary | ICD-10-CM

## 2012-06-12 DIAGNOSIS — C50912 Malignant neoplasm of unspecified site of left female breast: Secondary | ICD-10-CM

## 2012-06-12 NOTE — Patient Instructions (Signed)
You may begin left shoulder exercises as outlined on the instruction sheet. This is for the left arm only. Exercises of the right arm are limited as discussed.  Return to see Dr. Derrell Lolling in 7-9 days. Continue to keep a record of the drainage.

## 2012-06-12 NOTE — Progress Notes (Signed)
Patient ID: Mary [name], female   DOB: [birth year], 52 y.o.   MRN: [id number] History: This patient returns for a wound and drain check.  Initially evaluated in [facility] for locally advanced cancer of the right breast, upper outer quadrant. 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  Neoadjuvant chemotherapy was given.  05/18/2012 she underwent right modified radical mastectomy, left total mastectomy and sentinel node biopsy and removal of Port-A-Cath.  Final pathology on the right showed invasive lobular carcinoma, ypT2, yp N2a., 8 of 17 lymph nodes positive; there was good downstaging of the primary tumor. The left side showed DCIS.  She has seen Dr. Dayton Scrape who plans adjuvant radiation therapy starting in mid April.  She is doing fairly well .   All of the drains on the left are out and she has no problems with the left mastectomy. Both drains remain on the right and they are each draining 20-25 cc per day.  Exam: Patient looks well. Husband with her today. No distress Bilateral mastectomy incisions are healing nicely. No fluid collections or infection. No skin necrosis. Left side reveals no residual seroma. Left arm stretching exercises discussed. Right mastectomy wound looks clean. No infection or drainage. The drains were left in place. Wound is redressed  Assessment:   receptor positive, HER-2 negative invasive lobular carcinoma right breast, pathologic stage ypT2, ypN2a, 8/17 nodes positive  DCIS left breast  Healing uneventfully following bilateral mastectomy and Port-A-Cath removal  Plan: Leave drains on the right for another week. Hopefully removed next time. Referred to physical therapy after all drains removed Okay to drive her car Probably back to work in 10 days or so.   Angelia Mould. Derrell Lolling, M.D., [facility] General and Minimally invasive Surgery Breast and Colorectal Surgery Office:   [phone] Pager:    [phone]

## 2012-06-14 ENCOUNTER — Telehealth (INDEPENDENT_AMBULATORY_CARE_PROVIDER_SITE_OTHER): Payer: Self-pay | Admitting: *Deleted

## 2012-06-14 NOTE — Telephone Encounter (Signed)
Patient called to state low grade fever beginning last night with no other symptoms.  Patient reports no signs of infection at incision site.  Patient also denies any congestion, nausea, or diarrhea.  Spoke to Dr. Derrell Lolling who agrees to have patient continue to watch for any new symptoms and if any arise then have patient come in to be seen.  Patient will continue on Tylenol to help with fever.  Discussed symptoms that can be associated with infection and patient states understanding. Patient states understand and agreeable at this time.  Patient will call back to update me regarding her fever before tomorrow at 5p.

## 2012-06-15 ENCOUNTER — Telehealth (INDEPENDENT_AMBULATORY_CARE_PROVIDER_SITE_OTHER): Payer: Self-pay | Admitting: *Deleted

## 2012-06-15 ENCOUNTER — Encounter (INDEPENDENT_AMBULATORY_CARE_PROVIDER_SITE_OTHER): Payer: Self-pay | Admitting: General Surgery

## 2012-06-15 ENCOUNTER — Encounter (INDEPENDENT_AMBULATORY_CARE_PROVIDER_SITE_OTHER): Payer: Commercial Managed Care - PPO | Admitting: General Surgery

## 2012-06-15 ENCOUNTER — Ambulatory Visit (INDEPENDENT_AMBULATORY_CARE_PROVIDER_SITE_OTHER): Payer: Commercial Managed Care - PPO | Admitting: General Surgery

## 2012-06-15 ENCOUNTER — Telehealth (INDEPENDENT_AMBULATORY_CARE_PROVIDER_SITE_OTHER): Payer: Self-pay

## 2012-06-15 VITALS — BP 118/62 | HR 88 | Temp 98.9°F | Resp 16 | Ht 64.0 in | Wt 161.0 lb

## 2012-06-15 DIAGNOSIS — C50419 Malignant neoplasm of upper-outer quadrant of unspecified female breast: Secondary | ICD-10-CM

## 2012-06-15 DIAGNOSIS — C50912 Malignant neoplasm of unspecified site of left female breast: Secondary | ICD-10-CM

## 2012-06-15 DIAGNOSIS — C50919 Malignant neoplasm of unspecified site of unspecified female breast: Secondary | ICD-10-CM

## 2012-06-15 DIAGNOSIS — C50411 Malignant neoplasm of upper-outer quadrant of right female breast: Secondary | ICD-10-CM

## 2012-06-15 NOTE — Patient Instructions (Signed)
The cause of your fever appears to be a cellulitis of the right mastectomy wound. We removed the drains today.  Take both of the antibiotics until they are completely gone.  You may begin to showers, starting in 24 hours.  Keep your appointment with Dr. Derrell Lolling next Wednesday, but call sooner if you do not clearly improve.

## 2012-06-15 NOTE — Telephone Encounter (Signed)
Patient called in again this morning stating she is still running fevers even with the Tylenol.  Patient states her fever now is 102F oral.  Patient also states that the area around her drains are becoming red and painful to touch.  Patient scheduled to come into urgent office today.

## 2012-06-15 NOTE — Progress Notes (Signed)
Patient ID: Mary [name], female   DOB: 1961/02/16, 52 y.o.   MRN: [id number] History: This patient was seen 3 days ago in the office, postop bilateral mastectomy and was looking good. She has now had fevers greater than 101 for 24 hours and has noticed some increased drainage and tenderness around the drain sites on the right. She denies diaphoresis.  Denies  nausea or vomiting.  Exam: Patient looks well. No distress. Husband is here with her. Drain sites inferolateral right mastectomy site shows some purulent drainage around the drains and there is some erythema and slight induration along the lateral aspect of the lower flap. There is no skin necrosis. There is no fluctuance. There is no fluid collection. The drains were removed. I cannot express the distal little bit of fluid from the drain sites. This was redressed. The left mastectomy wound looks good. No infection or fluid. Site of Port-A-Cath removal left infraclavicular area also looks fine.  Assessment: Cellulitis right mastectomy wound, drain sites. Suspect this was secondary to one of the drains occluding no drainable fluid collection at this time. Patient is not toxic  Plan: I gave her a 10 day supply of doxycycline and a site 7 day supply of Cipro. Wound care discussed Return to see me at her scheduled appointment in 5 days, but call sooner if she does not promptly improved.    Angelia Mould. Derrell Lolling, M.D., [facility] General and Minimally invasive Surgery Breast and Colorectal Surgery Office:   [phone] Pager:   [phone]

## 2012-06-15 NOTE — Telephone Encounter (Signed)
Pt called triage this am stating she is running temp 102 with tylenol. Drain site red. No urinary symptoms.  Reviewed with Dr Derrell Lolling via phone. Stat cbc at [facility] this am and see him in office today at 2:45pm. Pt advised and will go to [facility] now and keep appt this afternoon.

## 2012-06-20 ENCOUNTER — Encounter (INDEPENDENT_AMBULATORY_CARE_PROVIDER_SITE_OTHER): Payer: Self-pay | Admitting: General Surgery

## 2012-06-20 ENCOUNTER — Ambulatory Visit (INDEPENDENT_AMBULATORY_CARE_PROVIDER_SITE_OTHER): Payer: Commercial Managed Care - PPO | Admitting: General Surgery

## 2012-06-20 VITALS — BP 132/74 | HR 70 | Temp 98.6°F | Resp 18 | Ht 64.0 in | Wt 158.4 lb

## 2012-06-20 DIAGNOSIS — C50912 Malignant neoplasm of unspecified site of left female breast: Secondary | ICD-10-CM

## 2012-06-20 DIAGNOSIS — C50411 Malignant neoplasm of upper-outer quadrant of right female breast: Secondary | ICD-10-CM

## 2012-06-20 DIAGNOSIS — C50919 Malignant neoplasm of unspecified site of unspecified female breast: Secondary | ICD-10-CM

## 2012-06-20 DIAGNOSIS — C50419 Malignant neoplasm of upper-outer quadrant of unspecified female breast: Secondary | ICD-10-CM

## 2012-06-20 NOTE — Patient Instructions (Signed)
The cellulitis in your right mastectomy incision is resolving. There is no fluid collection. Everything looks better.  Continue the antibiotics until they are all gone.  It is okay to begin radiation therapy this month  Return to see Dr. Derrell Lolling in 10-12 weeks.

## 2012-06-20 NOTE — Progress Notes (Signed)
Patient ID: Mary [name], female   DOB: [birth year], 52 y.o.   MRN: [id number] History: The patient returns for a wound check, 5 days following removal of right mastectomy drains and initiation of antibiotics for low-grade cellulitis. She states she is feeling much better. The pain has essentially resolved. Drainage has almost resolved. She is going for simulation for radiation therapy next week.  Recall that she underwent neoadjuvant chemotherapy for locally advanced cancer of the right breast, upper outer quadrant, receptor positive, HER-2-negative, T3 N1 on the right and DCIS in the left. On February 28 is 14 she underwent right modified radical mastectomy, left total mastectomy and sentinel biopsy and removal of Port-A-Cath. Pathology on the right showed invasive lobular carcinoma, ypT2, ypN2a, 8 of 17 lymph nodes positive, there was good downstaging of the  primary tumor. Left side showed DCIS.  Exam: Patient looks much better. No distress. Erythema of the lateral aspect of right mastectomy wound has almost resolved. There is no residual fluid collection or seroma. Only a spot of drainage on the bandage. Left mastectomy wound looks good. Port-A-Cath site left infraclavicular area looks good. She has essentially 100% range of motion both shoulders.  Assessment receptor positive, HER-2-negative invasive lobular carcinoma right breast, locally advanced.   DCIS left breast Cellulitis right mastectomy wound, resolving following removal of drains and antibiotics  Plan: Okay to initiate radiation therapy mid April, assuming that the infection does not recur Return to see me in 10-12 weeks Activities discussed    Angelia Mould. Derrell Lolling, M.D., [facility] General and Minimally invasive Surgery Breast and Colorectal Surgery Office:   [phone] Pager:   [phone]

## 2012-06-25 ENCOUNTER — Ambulatory Visit
Admission: RE | Admit: 2012-06-25 | Discharge: 2012-06-25 | Disposition: A | Discharge status: Home / Self Care | Payer: 59 | Source: Ambulatory Visit | Attending: Radiation Oncology | Admitting: Radiation Oncology

## 2012-06-25 DIAGNOSIS — Z510 Encounter for antineoplastic radiation therapy: Secondary | ICD-10-CM | POA: Insufficient documentation

## 2012-06-25 DIAGNOSIS — Y842 Radiological procedure and radiotherapy as the cause of abnormal reaction of the patient, or of later complication, without mention of misadventure at the time of the procedure: Secondary | ICD-10-CM | POA: Insufficient documentation

## 2012-06-25 DIAGNOSIS — C50919 Malignant neoplasm of unspecified site of unspecified female breast: Secondary | ICD-10-CM | POA: Insufficient documentation

## 2012-06-25 DIAGNOSIS — C50411 Malignant neoplasm of upper-outer quadrant of right female breast: Secondary | ICD-10-CM

## 2012-06-25 DIAGNOSIS — L988 Other specified disorders of the skin and subcutaneous tissue: Secondary | ICD-10-CM | POA: Insufficient documentation

## 2012-06-25 NOTE — Progress Notes (Signed)
Simulation/treatment planning note: The patient was taken to the CT simulator. She was placed on a custom breast board and a custom neck mold was constructed for immobilization. Her right chest wall down was marked with radiopaque wires. Her mastectomy scar was also marked in addition to to drain sites along the inferior aspect of the chest wall. She was then scanned. Her field borders, right mastectomy scar, and normal anatomy were contoured. She was set up to tangential fields to right chest wall. 2 custom blocks were constructed to conform the field. She was then set up to the right supraclavicular/axillary region, LAO and a separate multileaf collimator was designed. Lastly, she was set up PA to the right axilla and a separate multileaf, was designed for a total of 5 complex treatment devices. I am prescribing 5040 cGy in 20 sessions to her right chest wall with 1.0 cm custom bolus applied to the skin/chest wall every other day. Also prescribing 5040 cGy to the right supraclavicular axillary region at 3 cm depth. This will be supplemented by a PA right axillary field to bring the midaxillary dose up to 4600 cGy and 28 sessions. Following her photon radiation she undergo a boost to her right mastectomy scar to deliver a further 1000 cGy in 5 sessions. She is now ready for treatment planning.

## 2012-07-02 ENCOUNTER — Ambulatory Visit (HOSPITAL_BASED_OUTPATIENT_CLINIC_OR_DEPARTMENT_OTHER): Payer: 59

## 2012-07-02 ENCOUNTER — Ambulatory Visit: Admission: RE | Admit: 2012-07-02 | Payer: 59 | Source: Ambulatory Visit | Admitting: Radiation Oncology

## 2012-07-02 ENCOUNTER — Ambulatory Visit
Admission: RE | Admit: 2012-07-02 | Discharge: 2012-07-02 | Disposition: A | Discharge status: Home / Self Care | Payer: 59 | Source: Ambulatory Visit | Attending: Radiation Oncology | Admitting: Radiation Oncology

## 2012-07-02 VITALS — BP 135/68 | HR 73 | Temp 98.4°F

## 2012-07-02 DIAGNOSIS — Z5111 Encounter for antineoplastic chemotherapy: Secondary | ICD-10-CM

## 2012-07-02 DIAGNOSIS — C50411 Malignant neoplasm of upper-outer quadrant of right female breast: Secondary | ICD-10-CM

## 2012-07-02 DIAGNOSIS — C50419 Malignant neoplasm of upper-outer quadrant of unspecified female breast: Secondary | ICD-10-CM

## 2012-07-02 DIAGNOSIS — C50912 Malignant neoplasm of unspecified site of left female breast: Secondary | ICD-10-CM

## 2012-07-02 MED ORDER — GOSERELIN ACETATE 3.6 MG ~~LOC~~ IMPL
3.6000 mg | DRUG_IMPLANT | Freq: Once | SUBCUTANEOUS | Status: AC
  Administered 2012-07-02: 3.6 mg via SUBCUTANEOUS
  Filled 2012-07-02: qty 3.6

## 2012-07-02 NOTE — Progress Notes (Signed)
Simulation verification note: The patient underwent simulation verification for treatment to her right breast and regional lymph nodes. Her isocenter was in good position and the multileaf collimators for her right chest wall and supraclavicular/axillary regions contoured the intended treatment volume appropriately.

## 2012-07-03 ENCOUNTER — Encounter (INDEPENDENT_AMBULATORY_CARE_PROVIDER_SITE_OTHER): Payer: Self-pay | Admitting: General Surgery

## 2012-07-03 ENCOUNTER — Ambulatory Visit
Admission: RE | Admit: 2012-07-03 | Discharge: 2012-07-03 | Disposition: A | Discharge status: Home / Self Care | Payer: 59 | Source: Ambulatory Visit | Attending: Radiation Oncology | Admitting: Radiation Oncology

## 2012-07-04 ENCOUNTER — Encounter: Payer: Self-pay | Admitting: Radiation Oncology

## 2012-07-04 ENCOUNTER — Ambulatory Visit
Admission: RE | Admit: 2012-07-04 | Discharge: 2012-07-04 | Disposition: A | Discharge status: Home / Self Care | Payer: 59 | Source: Ambulatory Visit | Attending: Radiation Oncology | Admitting: Radiation Oncology

## 2012-07-04 ENCOUNTER — Telehealth: Payer: Self-pay | Admitting: Radiation Oncology

## 2012-07-04 DIAGNOSIS — C50411 Malignant neoplasm of upper-outer quadrant of right female breast: Secondary | ICD-10-CM

## 2012-07-04 MED ORDER — RADIAPLEXRX EX GEL
Freq: Once | CUTANEOUS | Status: AC
  Administered 2012-07-04: 1 via TOPICAL

## 2012-07-04 MED ORDER — ALRA NON-METALLIC DEODORANT (RAD-ONC)
1.0000 "application " | Freq: Once | TOPICAL | Status: AC
  Administered 2012-07-04: 1 via TOPICAL

## 2012-07-04 NOTE — Progress Notes (Signed)
Ms. Wolgamott came to nursing for post sim education.  She was given the Radiation therapy and You booklet and educated on the potential side effects of radiation including fatigue and skin changes.  She was given Radiaplex gel and was instructed to apply it twice a day with the first application at least 4 hours before her treatment time.  She was also given and educated on Alra deoderant.  She was also advised to eat a high protein diet.

## 2012-07-04 NOTE — Telephone Encounter (Signed)
FMLA papers to machine for patient to pick up today.

## 2012-07-04 NOTE — Progress Notes (Signed)
Weekly Management Note:  Site: Right chest wall/regional lymph nodes Current Dose:  360  cGy Projected Dose: 5040  cGy  Narrative: The patient is seen today for routine under treatment assessment. CBCT/MVCT images/port films were reviewed. The chart was reviewed.   No complaints today. She undergoes patient education today.  Physical Examination: There were no vitals filed for this visit..  Weight:  . No skin changes  Impression: Tolerating radiation therapy well.  Plan: Continue radiation therapy as planned.

## 2012-07-05 ENCOUNTER — Ambulatory Visit
Admission: RE | Admit: 2012-07-05 | Discharge: 2012-07-05 | Disposition: A | Discharge status: Home / Self Care | Payer: 59 | Source: Ambulatory Visit | Attending: Radiation Oncology | Admitting: Radiation Oncology

## 2012-07-06 ENCOUNTER — Ambulatory Visit
Admission: RE | Admit: 2012-07-06 | Discharge: 2012-07-06 | Disposition: A | Discharge status: Home / Self Care | Payer: 59 | Source: Ambulatory Visit | Attending: Radiation Oncology | Admitting: Radiation Oncology

## 2012-07-06 ENCOUNTER — Other Ambulatory Visit: Payer: Self-pay | Admitting: Medical Oncology

## 2012-07-06 NOTE — Telephone Encounter (Signed)
Per Augustin Schooling, NP ok to refill patients request of Lorazepam 0.5 mg tablet, 60 count. Called refill in to pt's pharmacy of choice, pt informed, expressed thanks, no further questions at this time.

## 2012-07-09 ENCOUNTER — Ambulatory Visit
Admission: RE | Admit: 2012-07-09 | Discharge: 2012-07-09 | Disposition: A | Discharge status: Home / Self Care | Payer: 59 | Source: Ambulatory Visit | Attending: Radiation Oncology | Admitting: Radiation Oncology

## 2012-07-09 ENCOUNTER — Ambulatory Visit: Admission: RE | Admit: 2012-07-09 | Payer: 59 | Source: Ambulatory Visit | Admitting: Radiation Oncology

## 2012-07-09 ENCOUNTER — Encounter: Payer: Self-pay | Admitting: Oncology

## 2012-07-09 VITALS — BP 147/82 | HR 74 | Temp 97.8°F | Ht 64.0 in | Wt 158.3 lb

## 2012-07-09 DIAGNOSIS — C50411 Malignant neoplasm of upper-outer quadrant of right female breast: Secondary | ICD-10-CM

## 2012-07-09 NOTE — Progress Notes (Signed)
Ms. Rieger is here for her weekly under treatment visit.  She has had 5/28 fractions to her right breast.  She denies pain and fatigue.  The skin on her right breast is intact.  She is using radiaplex gel twice a day.

## 2012-07-09 NOTE — Progress Notes (Signed)
Weekly Management Note:  Site: Right chest wall/regional lymph nodes Current Dose:  900  cGy Projected Dose: 5040  cGy followed by right chest wall boost  Narrative: The patient is seen today for routine under treatment assessment. CBCT/MVCT images/port films were reviewed. The chart was reviewed.   She is without complaints today. She uses Radioplex gel twice a day.  Physical Examination:  Filed Vitals:   07/09/12 1648  BP: 147/82  Pulse: 74  Temp: 97.8 F (36.6 C)  .  Weight: 158 lb 4.8 oz (71.804 kg). There are no significant skin changes.  Impression: Tolerating radiation therapy well.  Plan: Continue radiation therapy as planned.

## 2012-07-10 ENCOUNTER — Ambulatory Visit
Admission: RE | Admit: 2012-07-10 | Discharge: 2012-07-10 | Disposition: A | Discharge status: Home / Self Care | Payer: 59 | Source: Ambulatory Visit | Attending: Radiation Oncology | Admitting: Radiation Oncology

## 2012-07-11 ENCOUNTER — Ambulatory Visit
Admission: RE | Admit: 2012-07-11 | Discharge: 2012-07-11 | Disposition: A | Discharge status: Home / Self Care | Payer: 59 | Source: Ambulatory Visit | Attending: Radiation Oncology | Admitting: Radiation Oncology

## 2012-07-12 ENCOUNTER — Ambulatory Visit
Admission: RE | Admit: 2012-07-12 | Discharge: 2012-07-12 | Disposition: A | Discharge status: Home / Self Care | Payer: 59 | Source: Ambulatory Visit | Attending: Radiation Oncology | Admitting: Radiation Oncology

## 2012-07-13 ENCOUNTER — Ambulatory Visit
Admission: RE | Admit: 2012-07-13 | Discharge: 2012-07-13 | Disposition: A | Discharge status: Home / Self Care | Payer: 59 | Source: Ambulatory Visit | Attending: Radiation Oncology | Admitting: Radiation Oncology

## 2012-07-16 ENCOUNTER — Ambulatory Visit
Admission: RE | Admit: 2012-07-16 | Discharge: 2012-07-16 | Disposition: A | Discharge status: Home / Self Care | Payer: 59 | Source: Ambulatory Visit | Attending: Radiation Oncology | Admitting: Radiation Oncology

## 2012-07-16 ENCOUNTER — Encounter (INDEPENDENT_AMBULATORY_CARE_PROVIDER_SITE_OTHER): Payer: Self-pay

## 2012-07-16 VITALS — BP 133/87 | HR 74 | Temp 97.7°F | Ht 64.0 in | Wt 158.6 lb

## 2012-07-16 DIAGNOSIS — C50912 Malignant neoplasm of unspecified site of left female breast: Secondary | ICD-10-CM

## 2012-07-16 MED ORDER — RADIAPLEXRX EX GEL
Freq: Once | CUTANEOUS | Status: AC
  Administered 2012-07-16: 1 via TOPICAL

## 2012-07-16 NOTE — Progress Notes (Signed)
Mary [name] here for weekly under treat visit.  She has had 10/28 fractions to her right breast.  She denies pain.  She does have fatigue and is having trouble sleeping since having zolodex injections.  She is having hot flashes during the night.  The skin on her right chest is intact and reddened.  She is using radiaplex gel twice a day.

## 2012-07-16 NOTE — Progress Notes (Signed)
Weekly Management Note:  Site: Right chest wall/regional lymph nodes Current Dose:  1800  cGy Projected Dose: 5040  cGy followed by right chest wall boost  Narrative: The patient is seen today for routine under treatment assessment. CBCT/MVCT images/port films were reviewed. The chart was reviewed.   She is without complaints today except for hot flashes related to her Zoladex injections and also some fatigue. She uses Radioplex gel.  Physical Examination:  Filed Vitals:   07/16/12 1805  BP: 133/87  Pulse: 74  Temp: 97.7 F (36.5 C)  .  Weight: 158 lb 9.6 oz (71.94 kg). There is erythema along her right chest wall and nodal region. No areas of desquamation.  Impression: Tolerating radiation therapy well.  Plan: Continue radiation therapy as planned.

## 2012-07-17 ENCOUNTER — Ambulatory Visit
Admission: RE | Admit: 2012-07-17 | Discharge: 2012-07-17 | Disposition: A | Discharge status: Home / Self Care | Payer: 59 | Source: Ambulatory Visit | Attending: Radiation Oncology | Admitting: Radiation Oncology

## 2012-07-18 ENCOUNTER — Ambulatory Visit
Admission: RE | Admit: 2012-07-18 | Discharge: 2012-07-18 | Disposition: A | Discharge status: Home / Self Care | Payer: 59 | Source: Ambulatory Visit | Attending: Radiation Oncology | Admitting: Radiation Oncology

## 2012-07-19 ENCOUNTER — Ambulatory Visit
Admission: RE | Admit: 2012-07-19 | Discharge: 2012-07-19 | Disposition: A | Discharge status: Home / Self Care | Payer: 59 | Source: Ambulatory Visit | Attending: Radiation Oncology | Admitting: Radiation Oncology

## 2012-07-20 ENCOUNTER — Ambulatory Visit
Admission: RE | Admit: 2012-07-20 | Discharge: 2012-07-20 | Disposition: A | Discharge status: Home / Self Care | Payer: 59 | Source: Ambulatory Visit | Attending: Radiation Oncology | Admitting: Radiation Oncology

## 2012-07-23 ENCOUNTER — Ambulatory Visit
Admission: RE | Admit: 2012-07-23 | Discharge: 2012-07-23 | Disposition: A | Discharge status: Home / Self Care | Payer: 59 | Source: Ambulatory Visit | Attending: Radiation Oncology | Admitting: Radiation Oncology

## 2012-07-23 VITALS — BP 140/87 | HR 67 | Temp 97.8°F | Ht 64.0 in | Wt 159.3 lb

## 2012-07-23 DIAGNOSIS — C50411 Malignant neoplasm of upper-outer quadrant of right female breast: Secondary | ICD-10-CM

## 2012-07-23 MED ORDER — BIAFINE EX EMUL
CUTANEOUS | Status: DC | PRN
  Administered 2012-07-23: 1 via TOPICAL

## 2012-07-23 NOTE — Progress Notes (Signed)
Patient here for weekly assessment of right breast radiation.Skin with red hyperpigmentation.Will change to biafine for itching and redness.Mild fatigue.

## 2012-07-23 NOTE — Progress Notes (Signed)
Weekly Management Note:  Site: Right chest wall/regional lymph nodes Current Dose:  2700  cGy Projected Dose: 5040  cGy followed by right chest wall/mastectomy scar boost  Narrative: The patient is seen today for routine under treatment assessment. CBCT/MVCT images/port films were reviewed. The chart was reviewed.   No complaints today except for slight pruritus for which she uses Biafine cream.  Physical Examination:  Filed Vitals:   07/23/12 1659  BP: 140/87  Pulse: 67  Temp: 97.8 F (36.6 C)  .  Weight: 159 lb 4.8 oz (72.258 kg). There is mild to moderate erythema along her right chest wall. No areas of desquamation.  Impression: Tolerating radiation therapy well.  Plan: Continue radiation therapy as planned.

## 2012-07-24 ENCOUNTER — Ambulatory Visit
Admission: RE | Admit: 2012-07-24 | Discharge: 2012-07-24 | Disposition: A | Discharge status: Home / Self Care | Payer: 59 | Source: Ambulatory Visit | Attending: Radiation Oncology | Admitting: Radiation Oncology

## 2012-07-24 ENCOUNTER — Other Ambulatory Visit: Payer: Self-pay | Admitting: Internal Medicine

## 2012-07-24 NOTE — Telephone Encounter (Signed)
Please call in the alprazolam and lomotil for patient,  thanks

## 2012-07-24 NOTE — Telephone Encounter (Signed)
Ok to refill both? Last visit here 7/13.

## 2012-07-24 NOTE — Telephone Encounter (Signed)
Rxs called to [facility].

## 2012-07-25 ENCOUNTER — Ambulatory Visit
Admission: RE | Admit: 2012-07-25 | Discharge: 2012-07-25 | Disposition: A | Discharge status: Home / Self Care | Payer: 59 | Source: Ambulatory Visit | Attending: Radiation Oncology | Admitting: Radiation Oncology

## 2012-07-26 ENCOUNTER — Ambulatory Visit
Admission: RE | Admit: 2012-07-26 | Discharge: 2012-07-26 | Disposition: A | Discharge status: Home / Self Care | Payer: 59 | Source: Ambulatory Visit | Attending: Radiation Oncology | Admitting: Radiation Oncology

## 2012-07-27 ENCOUNTER — Ambulatory Visit
Admission: RE | Admit: 2012-07-27 | Discharge: 2012-07-27 | Disposition: A | Discharge status: Home / Self Care | Payer: 59 | Source: Ambulatory Visit | Attending: Radiation Oncology | Admitting: Radiation Oncology

## 2012-07-28 ENCOUNTER — Ambulatory Visit
Admission: RE | Admit: 2012-07-28 | Discharge: 2012-07-28 | Disposition: A | Discharge status: Home / Self Care | Payer: 59 | Source: Ambulatory Visit | Attending: Radiation Oncology | Admitting: Radiation Oncology

## 2012-07-30 ENCOUNTER — Ambulatory Visit: Payer: 59

## 2012-07-30 ENCOUNTER — Ambulatory Visit
Admission: RE | Admit: 2012-07-30 | Discharge: 2012-07-30 | Disposition: A | Discharge status: Home / Self Care | Payer: 59 | Source: Ambulatory Visit | Attending: Radiation Oncology | Admitting: Radiation Oncology

## 2012-07-30 ENCOUNTER — Ambulatory Visit (HOSPITAL_BASED_OUTPATIENT_CLINIC_OR_DEPARTMENT_OTHER): Payer: 59

## 2012-07-30 VITALS — BP 128/82 | HR 71 | Temp 97.8°F | Wt 159.3 lb

## 2012-07-30 VITALS — BP 141/66 | HR 83 | Temp 98.1°F | Resp 20

## 2012-07-30 DIAGNOSIS — C50411 Malignant neoplasm of upper-outer quadrant of right female breast: Secondary | ICD-10-CM

## 2012-07-30 DIAGNOSIS — Z5111 Encounter for antineoplastic chemotherapy: Secondary | ICD-10-CM

## 2012-07-30 DIAGNOSIS — C50912 Malignant neoplasm of unspecified site of left female breast: Secondary | ICD-10-CM

## 2012-07-30 DIAGNOSIS — C50419 Malignant neoplasm of upper-outer quadrant of unspecified female breast: Secondary | ICD-10-CM

## 2012-07-30 MED ORDER — GOSERELIN ACETATE 3.6 MG ~~LOC~~ IMPL
3.6000 mg | DRUG_IMPLANT | Freq: Once | SUBCUTANEOUS | Status: AC
  Administered 2012-07-30: 3.6 mg via SUBCUTANEOUS
  Filled 2012-07-30: qty 3.6

## 2012-07-30 NOTE — Progress Notes (Signed)
   Weekly Management Note: outpatient Current Dose:  36 Gy  Projected Dose: 50.4  Gy  + boost  Narrative:  The patient presents for routine under treatment assessment.  CBCT/MVCT images/Port film x-rays were reviewed.  The chart was checked. More skin irritation, still working.  Physical Findings:  weight is 159 lb 4.8 oz (72.258 kg). Her temperature is 97.8 F (36.6 C). Her blood pressure is 128/82 and her pulse is 71.  dry skin, marked erythema, over right chest wall, upper back  Impression:  The patient is tolerating radiotherapy.  Plan:  Continue radiotherapy as planned. Biafine TID recommended.  ________________________________   Lonie Peak, M.D.

## 2012-07-30 NOTE — Progress Notes (Signed)
Patient for weekly assessment of right chest wall.Skin with significant hyperpigmentation.No peeling.Has mild discomfort with itching.Given additional tube of biafine.Mild fatigue.

## 2012-07-31 ENCOUNTER — Ambulatory Visit
Admission: RE | Admit: 2012-07-31 | Discharge: 2012-07-31 | Disposition: A | Discharge status: Home / Self Care | Payer: 59 | Source: Ambulatory Visit | Attending: Radiation Oncology | Admitting: Radiation Oncology

## 2012-07-31 ENCOUNTER — Ambulatory Visit: Payer: 59

## 2012-07-31 DIAGNOSIS — C50411 Malignant neoplasm of upper-outer quadrant of right female breast: Secondary | ICD-10-CM

## 2012-07-31 MED ORDER — BIAFINE EX EMUL
CUTANEOUS | Status: DC | PRN
  Administered 2012-07-31: 1 via TOPICAL

## 2012-08-01 ENCOUNTER — Ambulatory Visit
Admission: RE | Admit: 2012-08-01 | Discharge: 2012-08-01 | Disposition: A | Discharge status: Home / Self Care | Payer: 59 | Source: Ambulatory Visit | Attending: Radiation Oncology | Admitting: Radiation Oncology

## 2012-08-02 ENCOUNTER — Ambulatory Visit
Admission: RE | Admit: 2012-08-02 | Discharge: 2012-08-02 | Disposition: A | Discharge status: Home / Self Care | Payer: 59 | Source: Ambulatory Visit | Attending: Radiation Oncology | Admitting: Radiation Oncology

## 2012-08-03 ENCOUNTER — Ambulatory Visit
Admission: RE | Admit: 2012-08-03 | Discharge: 2012-08-03 | Disposition: A | Discharge status: Home / Self Care | Payer: 59 | Source: Ambulatory Visit | Attending: Radiation Oncology | Admitting: Radiation Oncology

## 2012-08-06 ENCOUNTER — Ambulatory Visit
Admission: RE | Admit: 2012-08-06 | Discharge: 2012-08-06 | Disposition: A | Discharge status: Home / Self Care | Payer: 59 | Source: Ambulatory Visit | Attending: Radiation Oncology | Admitting: Radiation Oncology

## 2012-08-06 VITALS — BP 128/75 | HR 71 | Temp 98.0°F | Wt 157.7 lb

## 2012-08-06 DIAGNOSIS — C50912 Malignant neoplasm of unspecified site of left female breast: Secondary | ICD-10-CM

## 2012-08-06 MED ORDER — BIAFINE EX EMUL
CUTANEOUS | Status: DC | PRN
  Administered 2012-08-06: 18:00:00 via TOPICAL

## 2012-08-06 NOTE — Progress Notes (Signed)
Patient for weekly assessment of right chest wall radiation.Has 3 more treatments remaining of whole right chest before starting boost on this Friday.Marked erythema with some areas of dry desquamation.Will give additional tube of biafine.Moderate fatigue.Denies pain just discomfort.

## 2012-08-06 NOTE — Addendum Note (Signed)
Encounter addended by: Tessa Lerner, RN on: 08/06/2012  5:26 PM<BR>     Documentation filed: Orders

## 2012-08-06 NOTE — Progress Notes (Signed)
Weekly Management Note:  Site: Right chest wall/regional lymph nodes Current Dose:  4500  cGy Projected Dose: 5040  cGy followed by right chest wall mastectomy scar boost  Narrative: The patient is seen today for routine under treatment assessment. CBCT/MVCT images/port films were reviewed. The chart was reviewed.   She is without new complaints today. She uses Biafine cream when necessary. She underwent electron beam simulation today.  Physical Examination:  Filed Vitals:   08/06/12 1701  BP: 128/75  Pulse: 71  Temp: 98 F (36.7 C)  .  Weight: 157 lb 11.2 oz (71.532 kg). There is marked erythema of the right chest wall and also along the right posterior shoulder. There is patchy dry desquamation with impending focal moist desquamation along the lower right axilla.  Impression: Tolerating radiation therapy well.  Plan: Continue radiation therapy as planned.

## 2012-08-06 NOTE — Addendum Note (Signed)
Encounter addended by: Tessa Lerner, RN on: 08/06/2012  5:30 PM<BR>     Documentation filed: Inpatient MAR

## 2012-08-06 NOTE — Progress Notes (Signed)
Electron beam simulation note: The patient was taken to the [facility] with her right arm extended. She was set up RAO. I outlined her mastectomy scar/chest wall boost field. One custom block was constructed to conform the field. A special port plan is requested. I am prescribing 1000 cGy 5 sessions utilizing 6 MEV electrons. 0.5 cm custom bolus will be constructed and applied to the skin on the first day of her right chest wall electron beam treatment.

## 2012-08-07 ENCOUNTER — Ambulatory Visit
Admission: RE | Admit: 2012-08-07 | Discharge: 2012-08-07 | Disposition: A | Discharge status: Home / Self Care | Payer: 59 | Source: Ambulatory Visit | Attending: Radiation Oncology | Admitting: Radiation Oncology

## 2012-08-08 ENCOUNTER — Ambulatory Visit
Admission: RE | Admit: 2012-08-08 | Discharge: 2012-08-08 | Disposition: A | Discharge status: Home / Self Care | Payer: 59 | Source: Ambulatory Visit | Attending: Radiation Oncology | Admitting: Radiation Oncology

## 2012-08-08 ENCOUNTER — Telehealth: Payer: Self-pay | Admitting: Oncology

## 2012-08-08 ENCOUNTER — Ambulatory Visit (HOSPITAL_BASED_OUTPATIENT_CLINIC_OR_DEPARTMENT_OTHER): Payer: 59 | Admitting: Oncology

## 2012-08-08 ENCOUNTER — Encounter: Payer: Self-pay | Admitting: Radiation Oncology

## 2012-08-08 ENCOUNTER — Encounter: Payer: Self-pay | Admitting: Oncology

## 2012-08-08 VITALS — BP 145/88 | HR 79 | Temp 98.2°F | Resp 20 | Ht 64.0 in | Wt 157.1 lb

## 2012-08-08 DIAGNOSIS — M810 Age-related osteoporosis without current pathological fracture: Secondary | ICD-10-CM

## 2012-08-08 DIAGNOSIS — E559 Vitamin D deficiency, unspecified: Secondary | ICD-10-CM

## 2012-08-08 DIAGNOSIS — C50411 Malignant neoplasm of upper-outer quadrant of right female breast: Secondary | ICD-10-CM

## 2012-08-08 DIAGNOSIS — C50419 Malignant neoplasm of upper-outer quadrant of unspecified female breast: Secondary | ICD-10-CM

## 2012-08-08 DIAGNOSIS — N951 Menopausal and female climacteric states: Secondary | ICD-10-CM

## 2012-08-08 DIAGNOSIS — D0590 Unspecified type of carcinoma in situ of unspecified breast: Secondary | ICD-10-CM

## 2012-08-08 HISTORY — DX: Menopausal and female climacteric states: N95.1

## 2012-08-08 MED ORDER — VENLAFAXINE HCL ER 37.5 MG PO CP24: 37.5000 mg | ORAL_CAPSULE | Freq: Every day | ORAL | Status: DC

## 2012-08-08 NOTE — Progress Notes (Signed)
Electron beam simulation note: The patient underwent virtual simulation for her right chest wall electron beam boost. She was set up en face on the [facility] yesterday today I approved her planned. One custom block is constructed to conform the field. A special port plan is requested. I chose the 90% isodose curve to cover the planning target volume. 0.8 cm custom bolus will be constructed in apply to her skin on the first day of her electron beam boost.

## 2012-08-08 NOTE — Patient Instructions (Addendum)
#1 continue radiation therapy. He will finish this up on May 30.  #2 we will continue the Zoladex injections on a monthly basis.  #3 I have given you a prescription for Effexor XR 37.5 mg daily to see if this helps with the hot flashes you are experiencing.  #4 I will see you back on June 2 for followup and at that time we will plan on starting you on adjuvant antiestrogen therapy with Femara 2.5 mg daily. Information is as below.  Letrozole tablets What is this medicine? LETROZOLE (LET roe zole) blocks the production of estrogen. Certain types of breast cancer grow under the influence of estrogen. Letrozole helps block tumor growth. This medicine is used to treat advanced breast cancer in postmenopausal women. This medicine may be used for other purposes; ask your health care provider or pharmacist if you have questions. What should I tell my health care provider before I take this medicine? They need to know if you have any of these conditions: -liver disease -osteoporosis (weak bones) -an unusual or allergic reaction to letrozole, other medicines, foods, dyes, or preservatives -pregnant or trying to get pregnant -breast-feeding How should I use this medicine? Take this medicine by mouth with a glass of water. You may take it with or without food. Follow the directions on the prescription label. Take your medicine at regular intervals. Do not take your medicine more often than directed. Do not stop taking except on your doctor's advice. Talk to your pediatrician regarding the use of this medicine in children. Special care may be needed. Overdosage: If you think you have taken too much of this medicine contact a poison control center or emergency room at once. NOTE: This medicine is only for you. Do not share this medicine with others. What if I miss a dose? If you miss a dose, take it as soon as you can. If it is almost time for your next dose, take only that dose. Do not take double or  extra doses. What may interact with this medicine? Do not take this medicine with any of the following medications: -estrogens, like hormone replacement therapy or birth control pills This medicine may also interact with the following medications: -dietary supplements such as androstenedione or DHEA -prasterone -tamoxifen This list may not describe all possible interactions. Give your health care provider a list of all the medicines, herbs, non-prescription drugs, or dietary supplements you use. Also tell them if you smoke, drink alcohol, or use illegal drugs. Some items may interact with your medicine. What should I watch for while using this medicine? Visit your doctor or health care professional for regular check-ups to monitor your condition. Do not use this drug if you are pregnant. Serious side effects to an unborn child are possible. Talk to your doctor or pharmacist for more information. You may get drowsy or dizzy. Do not drive, use machinery, or do anything that needs mental alertness until you know how this medicine affects you. Do not stand or sit up quickly, especially if you are an older patient. This reduces the risk of dizzy or fainting spells. What side effects may I notice from receiving this medicine? Side effects that you should report to your doctor or health care professional as soon as possible: -allergic reactions like skin rash, itching, or hives -bone fracture -chest pain -difficulty breathing or shortness of breath -severe pain, swelling, warmth in the leg -unusually weak or tired -vaginal bleeding Side effects that usually do not require medical attention (  report to your doctor or health care professional if they continue or are bothersome): -bone, back, joint, or muscle pain -dizziness -fatigue -fluid retention -headache -hot flashes, night sweats -nausea -weight gain This list may not describe all possible side effects. Call your doctor for medical advice  about side effects. You may report side effects to FDA at 1-800-FDA-1088. Where should I keep my medicine? Keep out of the reach of children. Store between 15 and 30 degrees C (59 and 86 degrees F). Throw away any unused medicine after the expiration date. NOTE: This sheet is a summary. It may not cover all possible information. If you have questions about this medicine, talk to your doctor, pharmacist, or health care provider.  2013, [employer]. (05/18/2007 4:43:44 PM)

## 2012-08-09 ENCOUNTER — Ambulatory Visit
Admission: RE | Admit: 2012-08-09 | Discharge: 2012-08-09 | Disposition: A | Discharge status: Home / Self Care | Payer: 59 | Source: Ambulatory Visit | Attending: Radiation Oncology | Admitting: Radiation Oncology

## 2012-08-10 ENCOUNTER — Ambulatory Visit
Admission: RE | Admit: 2012-08-10 | Discharge: 2012-08-10 | Disposition: A | Discharge status: Home / Self Care | Payer: 59 | Source: Ambulatory Visit | Attending: Radiation Oncology | Admitting: Radiation Oncology

## 2012-08-14 ENCOUNTER — Ambulatory Visit
Admission: RE | Admit: 2012-08-14 | Discharge: 2012-08-14 | Disposition: A | Discharge status: Home / Self Care | Payer: 59 | Source: Ambulatory Visit | Attending: Radiation Oncology | Admitting: Radiation Oncology

## 2012-08-14 VITALS — BP 126/68 | HR 78 | Temp 97.6°F | Wt 158.3 lb

## 2012-08-14 DIAGNOSIS — C50411 Malignant neoplasm of upper-outer quadrant of right female breast: Secondary | ICD-10-CM

## 2012-08-14 NOTE — Progress Notes (Signed)
Weekly Management Note:  Site: Right chest wall boost Current Dose:  5440  cGy Projected Dose: 6040  cGy  Narrative: The patient is seen today for routine under treatment assessment. CBCT/MVCT images/port films were reviewed. The chart was reviewed.   She still doing well. She uses Biafine cream along her right chest wall.  Physical Examination:  Filed Vitals:   08/14/12 1659  BP: 126/68  Pulse: 78  Temp: 97.6 F (36.4 C)  .  Weight: 158 lb 4.8 oz (71.804 kg). There is diffuse erythema along the right chest wall with patchy dry desquamation. There is impending moist desquamation along her right mastectomy scar. There is also dry desquamation along her right posterior shoulder.  Impression: Tolerating radiation therapy well. She'll finish her treatment this Friday.  Plan: Continue radiation therapy as planned. One-month followup visit after completion of radiation therapy.

## 2012-08-14 NOTE — Progress Notes (Signed)
Patient here for weekly assessment of right chest wall radiation (breast ca).Right chest reddened with some dry areas.Discomfort of chest when sleeping.Given prescription for effexor for hot flashes but hasn't started yet.Denies fatigue.will give a tube of biafne as patient completes treatment on Friday.

## 2012-08-15 ENCOUNTER — Other Ambulatory Visit: Payer: Self-pay

## 2012-08-15 ENCOUNTER — Ambulatory Visit
Admission: RE | Admit: 2012-08-15 | Discharge: 2012-08-15 | Disposition: A | Discharge status: Home / Self Care | Payer: 59 | Source: Ambulatory Visit | Attending: Radiation Oncology | Admitting: Radiation Oncology

## 2012-08-15 DIAGNOSIS — C50411 Malignant neoplasm of upper-outer quadrant of right female breast: Secondary | ICD-10-CM

## 2012-08-15 MED ORDER — BIAFINE EX EMUL
CUTANEOUS | Status: DC | PRN
  Administered 2012-08-15: 1 via TOPICAL

## 2012-08-16 ENCOUNTER — Ambulatory Visit
Admission: RE | Admit: 2012-08-16 | Discharge: 2012-08-16 | Disposition: A | Discharge status: Home / Self Care | Payer: 59 | Source: Ambulatory Visit | Attending: Radiation Oncology | Admitting: Radiation Oncology

## 2012-08-16 ENCOUNTER — Encounter: Payer: Self-pay | Admitting: Radiation Oncology

## 2012-08-16 VITALS — BP 139/74 | HR 91 | Temp 98.2°F | Resp 20 | Wt 158.6 lb

## 2012-08-16 DIAGNOSIS — C50912 Malignant neoplasm of unspecified site of left female breast: Secondary | ICD-10-CM

## 2012-08-16 NOTE — Progress Notes (Signed)
Weekly rad txs right chest wall, 32/33 completed, bright erythema dermatitis on back with peeling,flaky  Skin on front and back of chest wall, uses biafine cream 3x day, tenderness and itching, biafine is helping stated patient, f/u appt card given to pt

## 2012-08-16 NOTE — Progress Notes (Signed)
Weekly Management Note:  Site: Left chest wall boost Current Dose:  5840  cGy Projected Dose: 6040  cGy  Narrative: The patient is seen today for routine under treatment assessment. CBCT/MVCT images/port films were reviewed. The chart was reviewed.   No new complaints today. She continues with her Biafine cream.  Physical Examination:  Filed Vitals:   08/16/12 1640  BP: 139/74  Pulse: 91  Temp: 98.2 F (36.8 C)  Resp: 20  .  Weight: 158 lb 9.6 oz (71.94 kg). There is diffuse erythema and dry desquamation along her  chest wall but no moist desquamation. She may develop a linear moist desquamation adjacent to her right mastectomy scar within the next few days.  Impression: Tolerating radiation therapy well.  Plan: Radiation therapy to be completed tomorrow . She was given a one-month followup appointment.

## 2012-08-17 ENCOUNTER — Ambulatory Visit
Admission: RE | Admit: 2012-08-17 | Discharge: 2012-08-17 | Disposition: A | Discharge status: Home / Self Care | Payer: 59 | Source: Ambulatory Visit | Attending: Radiation Oncology | Admitting: Radiation Oncology

## 2012-08-20 ENCOUNTER — Telehealth: Payer: Self-pay | Admitting: Oncology

## 2012-08-20 ENCOUNTER — Ambulatory Visit: Payer: 59 | Admitting: Lab

## 2012-08-20 ENCOUNTER — Encounter: Payer: Self-pay | Admitting: Radiation Oncology

## 2012-08-20 ENCOUNTER — Other Ambulatory Visit (HOSPITAL_BASED_OUTPATIENT_CLINIC_OR_DEPARTMENT_OTHER): Payer: 59 | Admitting: Lab

## 2012-08-20 ENCOUNTER — Ambulatory Visit (HOSPITAL_BASED_OUTPATIENT_CLINIC_OR_DEPARTMENT_OTHER): Payer: 59 | Admitting: Oncology

## 2012-08-20 VITALS — BP 146/82 | HR 73 | Temp 97.8°F | Resp 20 | Ht 64.0 in | Wt 157.3 lb

## 2012-08-20 DIAGNOSIS — D0590 Unspecified type of carcinoma in situ of unspecified breast: Secondary | ICD-10-CM

## 2012-08-20 DIAGNOSIS — C50419 Malignant neoplasm of upper-outer quadrant of unspecified female breast: Secondary | ICD-10-CM

## 2012-08-20 DIAGNOSIS — C50411 Malignant neoplasm of upper-outer quadrant of right female breast: Secondary | ICD-10-CM

## 2012-08-20 DIAGNOSIS — C50911 Malignant neoplasm of unspecified site of right female breast: Secondary | ICD-10-CM

## 2012-08-20 DIAGNOSIS — M810 Age-related osteoporosis without current pathological fracture: Secondary | ICD-10-CM

## 2012-08-20 DIAGNOSIS — E559 Vitamin D deficiency, unspecified: Secondary | ICD-10-CM

## 2012-08-20 LAB — VITAMIN D 25 HYDROXY (VIT D DEFICIENCY, FRACTURES): Vit D, 25-Hydroxy: 58 ng/mL (ref 30–89)

## 2012-08-20 LAB — COMPREHENSIVE METABOLIC PANEL (CC13)
ALT: 17 U/L (ref 0–55)
Alkaline Phosphatase: 99 U/L (ref 40–150)
CO2: 28 mEq/L (ref 22–29)
Creatinine: 0.8 mg/dL (ref 0.6–1.1)
Glucose: 100 mg/dl — ABNORMAL HIGH (ref 70–99)
Sodium: 144 mEq/L (ref 136–145)
Total Bilirubin: 0.31 mg/dL (ref 0.20–1.20)
Total Protein: 7.1 g/dL (ref 6.4–8.3)

## 2012-08-20 LAB — CBC WITH DIFFERENTIAL/PLATELET
EOS%: 4.4 % (ref 0.0–7.0)
LYMPH%: 14.7 % (ref 14.0–49.7)
MCH: 29.9 pg (ref 25.1–34.0)
MCV: 91.9 fL (ref 79.5–101.0)
MONO%: 10.4 % (ref 0.0–14.0)
Platelets: 235 10*3/uL (ref 145–400)
RBC: 4.54 10*6/uL (ref 3.70–5.45)
RDW: 14.2 % (ref 11.2–14.5)

## 2012-08-20 LAB — FOLLICLE STIMULATING HORMONE: FSH: 10.3 m[IU]/mL

## 2012-08-20 MED ORDER — TAMOXIFEN CITRATE 20 MG PO TABS: 20.0000 mg | ORAL_TABLET | Freq: Every day | ORAL | Status: DC

## 2012-08-20 NOTE — Progress Notes (Signed)
[facility] Radiation Oncology End of Treatment Note  Name:Gwendalyn A Mcintire  Date: 08/20/2012 ZOX:[id number] DOB:Apr 07, 1960   Status:outpatient    CC: Duncan Dull, MD  Dr. Claud Kelp  REFERRING PHYSICIAN:   Dr. Claud Kelp   DIAGNOSIS:  Medical stage IIIA (T3, N1, M0), pathologic stage IIIa (ypT2, ypN2a,M0) invasive lobular carcinoma the right breast. 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 6 MV photons PA right axillary field to bring the mid plane dose of 4600 cGy in 28 sessions. 10 MV photons LAO to the right supraclavicular/axillary region, doses prescribed at 3 cm depth. 6 MEV electrons, right chest wall/mastectomy scar boost                  NARRATIVE:  The patient tolerated treatment well with extensive dry desquamation and marked erythema the skin by completion of therapy. She had impending focal areas of moist desquamation along her mastectomy scar during her last week of therapy. She is Biafine cream during her course of therapy.                          PLAN: Routine followup in one month. Patient instructed to call if questions or worsening complaints in interim.

## 2012-08-20 NOTE — Patient Instructions (Addendum)
We have checked your hormones level to see if you are postmenopausal  You will begin tamoxifen 20 mg daily for now. Once you are determined to be postmenopausal then we will switch to aromasin orally and continue the zoladex injections  Tamoxifen oral tablet What is this medicine? TAMOXIFEN (ta MOX i fen) blocks the effects of estrogen. It is commonly used to treat breast cancer. It is also used to decrease the chance of breast cancer coming back in women who have received treatment for the disease. It may also help prevent breast cancer in women who have a high risk of developing breast cancer. This medicine may be used for other purposes; ask your health care provider or pharmacist if you have questions. What should I tell my health care provider before I take this medicine? They need to know if you have any of these conditions: -blood clots -blood disease -cataracts or impaired eyesight -endometriosis -high calcium levels -high cholesterol -irregular menstrual cycles -liver disease -stroke -uterine fibroids -an unusual or allergic reaction to tamoxifen, other medicines, foods, dyes, or preservatives -pregnant or trying to get pregnant -breast-feeding How should I use this medicine? Take this medicine by mouth with a glass of water. Follow the directions on the prescription label. You can take it with or without food. Take your medicine at regular intervals. Do not take your medicine more often than directed. Do not stop taking except on your doctor's advice. A special MedGuide will be given to you by the pharmacist with each prescription and refill. Be sure to read this information carefully each time. Talk to your pediatrician regarding the use of this medicine in children. While this drug may be prescribed for selected conditions, precautions do apply. Overdosage: If you think you have taken too much of this medicine contact a poison control center or emergency room at once. NOTE:  This medicine is only for you. Do not share this medicine with others. What if I miss a dose? If you miss a dose, take it as soon as you can. If it is almost time for your next dose, take only that dose. Do not take double or extra doses. What may interact with this medicine? -aminoglutethimide -bromocriptine -chemotherapy drugs -female hormones, like estrogens and birth control pills -letrozole -medroxyprogesterone -phenobarbital -rifampin -warfarin This list may not describe all possible interactions. Give your health care provider a list of all the medicines, herbs, non-prescription drugs, or dietary supplements you use. Also tell them if you smoke, drink alcohol, or use illegal drugs. Some items may interact with your medicine. What should I watch for while using this medicine? Visit your doctor or health care professional for regular checks on your progress. You will need regular pelvic exams, breast exams, and mammograms. If you are taking this medicine to reduce your risk of getting breast cancer, you should know that this medicine does not prevent all types of breast cancer. If breast cancer or other problems occur, there is no guarantee that it will be found at an early stage. Do not become pregnant while taking this medicine or for 2 months after stopping this medicine. Stop taking this medicine if you get pregnant or think you are pregnant and contact your doctor. This medicine may harm your unborn baby. Women who can possibly become pregnant should use birth control methods that do not use hormones during tamoxifen treatment and for 2 months after therapy has stopped. Talk with your health care provider for birth control advice. Do not breast  feed while taking this medicine. What side effects may I notice from receiving this medicine? Side effects that you should report to your doctor or health care professional as soon as possible: -changes in vision (blurred vision) -changes in  your menstrual cycle -difficulty breathing or shortness of breath -difficulty walking or talking -new breast lumps -numbness -pelvic pain or pressure -redness, blistering, peeling or loosening of the skin, including inside the mouth -skin rash or itching (hives) -sudden chest pain -swelling of lips, face, or tongue -swelling, pain or tenderness in your calf or leg -unusual bruising or bleeding -vaginal discharge that is bloody, brown, or rust -weakness -yellowing of the whites of the eyes or skin Side effects that usually do not require medical attention (report to your doctor or health care professional if they continue or are bothersome): -fatigue -hair loss, although uncommon and is usually mild -headache -hot flashes -impotence (in men) -nausea, vomiting (mild) -vaginal discharge (white or clear) This list may not describe all possible side effects. Call your doctor for medical advice about side effects. You may report side effects to FDA at 1-800-FDA-1088. Where should I keep my medicine? Keep out of the reach of children. Store at room temperature between 20 and 25 degrees C (68 and 77 degrees F). Protect from light. Keep container tightly closed. Throw away any unused medicine after the expiration date. NOTE: This sheet is a summary. It may not cover all possible information. If you have questions about this medicine, talk to your doctor, pharmacist, or health care provider.  2012, [employer] Standard. (11/22/2007 12:01:56 PM)   Exemestane tablets What is this medicine? EXEMESTANE (ex e MES tane) blocks the production of the hormone estrogen. Some types of breast cancer depend on estrogen to grow, and this medicine can stop tumor growth by blocking estrogen production. This medicine is for the treatment of breast cancer in postmenopausal women only. This medicine may be used for other purposes; ask your health care provider or pharmacist if you have questions. What should  I tell my health care provider before I take this medicine? They need to know if you have any of these conditions: -an unusual or allergic reaction to exemestane, other medicines, foods, dyes, or preservatives -pregnant or trying to get pregnant -breast-feeding How should I use this medicine? Take this medicine by mouth with a glass of water. Follow the directions on the prescription label. Take your doses at regular intervals after a meal. Do not take your medicine more often than directed. Do not stop taking except on the advice of your doctor or health care professional. Contact your pediatrician regarding the use of this medicine in children. Special care may be needed. Overdosage: If you think you have taken too much of this medicine contact a poison control center or emergency room at once. NOTE: This medicine is only for you. Do not share this medicine with others. What if I miss a dose? If you miss a dose, take the next dose as usual. Do not try to make up the missed dose. Do not take double or extra doses. What may interact with this medicine? Do not take this medicine with any of the following medications: -female hormones, like estrogens and birth control pills This medicine may also interact with the following medications: -androstenedione -phenytoin -rifabutin, rifampin, or rifapentine -St. John's Wort This list may not describe all possible interactions. Give your health care provider a list of all the medicines, herbs, non-prescription drugs, or dietary supplements you use.  Also tell them if you smoke, drink alcohol, or use illegal drugs. Some items may interact with your medicine. What should I watch for while using this medicine? Visit your doctor or health care professional for regular checks on your progress. If you experience hot flashes or sweating while taking this medicine, avoid alcohol, smoking and drinks with caffeine. This may help to decrease these side  effects. What side effects may I notice from receiving this medicine? Side effects that you should report to your doctor or health care professional as soon as possible: -any new or unusual symptoms -changes in vision -fever -leg or arm swelling -pain in bones, joints, or muscles -pain in hips, back, ribs, arms, shoulders, or legs Side effects that usually do not require medical attention (report to your doctor or health care professional if they continue or are bothersome): -difficulty sleeping -headache -hot flashes -sweating -unusually weak or tired This list may not describe all possible side effects. Call your doctor for medical advice about side effects. You may report side effects to FDA at 1-800-FDA-1088. Where should I keep my medicine? Keep out of the reach of children. Store at room temperature between 15 and 30 degrees C (59 and 86 degrees F). Throw away any unused medicine after the expiration date. NOTE: This sheet is a summary. It may not cover all possible information. If you have questions about this medicine, talk to your doctor, pharmacist, or health care provider.  2013, [employer] Standard. (07/10/2007 11:48:29 AM)

## 2012-08-26 NOTE — Progress Notes (Signed)
OFFICE PROGRESS NOTE  CC  Mary [name],TERESA, MD [address] [place] [address] Dr. Chipper Herb Dr. Jane Canary  DIAGNOSIS: 52 year old female with diagnosis of stage IIIA T3, N1, M0 invasive lobular carcinoma, Of the right breast with DCIS of the the left breast. Essentially patient has bilateral breast cancers.  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   #2 patient has begun FEC 100 starting on 12/08/2011 - 01/18/12. A total of 4 cycles of this chemotherapy is planned and then thereafter she will proceed with Taxol as a single agent for total of 12 weeks.  #3 patient is status post single agent Taxol x12 weeks from 02/02/2012 through January 2014.  #4patient is status post bilateral mastectomies performed on 05/18/2012. She had a right axillary lymph node dissection and left sentinel lymph node biopsy. Her port was removed also. Her final pathology on the right revealed invasive  Lobular carcinoma measuring 2.3 cm 8 of 17 lymph nodes were positive (T2 N2A). 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  CURRENT THERAPY Referred to radiation oncology  INTERVAL HISTORY: COLLETTE PESCADOR 52 y.o. female returns for follow-up visit today. She is continuing radiation therapy. She is tolerating it quite well she is certainly does have erythema to the chest wall due to radiation therapy. She is denying any nausea vomiting fevers chills night sweats headaches shortness of breath chest pains palpitations Remainder of the 10 point review of systems is negative.  MEDICAL HISTORY: Past Medical History  Diagnosis Date  . Irritable bowel syndrome   . Dental crowns present     x 4  . Neutropenia, drug-induced 12/15/2011  . Breast cancer August 2013    bilateral, er/pr+  . Complication of  anesthesia     pt states she had the shakes extremely bad  . Cough     at night d/t reflux;nothing productive  . History of bronchitis     08/2011  . GERD (gastroesophageal reflux disease)     takes Nexium daily  . IBS (irritable bowel syndrome)     immodium prn  . History of colon polyps   . Urinary frequency   . Nocturia   . Insomnia     takes Ambien nightly  . Anxiety     takes Xanax prn  . Plantar fasciitis   . Hot flashes, menopausal 08/08/2012    ALLERGIES:  is allergic to adhesive.  MEDICATIONS:  Current Outpatient Prescriptions  Medication Sig Dispense Refill  . ALPRAZolam (XANAX) 0.25 MG tablet TAKE ONE TABLET BY MOUTH TWICE DAILY AS NEEDED FOR ANXIETY  60 tablet  4  . Ascorbic Acid (VITAMIN C) 100 MG tablet Take 100 mg by mouth daily.        Marland Kitchen b complex vitamins tablet Take 1 tablet by mouth daily.       . Calcium Carbonate-Vitamin D (CALTRATE 600+D PO) Take 1 tablet by mouth daily.      . cholecalciferol (VITAMIN D) 400 UNITS TABS Take 400 Units by mouth daily.       Marland Kitchen esomeprazole (NEXIUM) 40 MG capsule Take 40 mg by mouth daily before breakfast.      . LORazepam (ATIVAN) 0.5 MG tablet TAKE ONE TABLET BY MOUTH EVERY 8 HOURS  60 tablet  0  . Multiple Vitamin (MULTIVITAMIN) tablet Take 1 tablet  by mouth daily.        . vitamin B-12 (CYANOCOBALAMIN) 100 MCG tablet Take 100 mcg by mouth daily.       . diphenoxylate-atropine (LOMOTIL) 2.5-0.025 MG per tablet TAKE ONE TABLET BY MOUTH 4 TIMES DAILY AS NEEDED  60 tablet  0  . loperamide (IMODIUM A-D) 2 MG tablet Take 2 mg by mouth 4 (four) times daily as needed for diarrhea or loose stools.       . polyethylene glycol (MIRALAX / GLYCOLAX) packet Take 17 g by mouth as needed.      . tamoxifen (NOLVADEX) 20 MG tablet Take 1 tablet (20 mg total) by mouth daily.  30 tablet  2  . venlafaxine XR (EFFEXOR-XR) 37.5 MG 24 hr capsule Take 1 capsule (37.5 mg total) by mouth daily.  30 capsule  6  . zolpidem (AMBIEN CR) 12.5 MG CR  tablet Take 1 tablet (12.5 mg total) by mouth at bedtime as needed for sleep.  30 tablet  0   No current facility-administered medications for this visit.    SURGICAL HISTORY:  Past Surgical History  Procedure Laterality Date  . Portacath placement  12/02/2011    Procedure: INSERTION PORT-A-CATH;  Surgeon: Ernestene Mention, MD;  Location: [facility];  Service: General;  Laterality: N/A;  insertion port a cath with fluoroscopy  . Breast lumpectomy  2008    left  . Colonoscopy    . Mastectomy with axillary lymph node dissection Right 05/18/2012    Procedure: MASTECTOMY WITH AXILLARY LYMPH NODE DISSECTION;  Surgeon: Ernestene Mention, MD;  Location: [facility];  Service: General;  Laterality: Right;  Bilateral Total Mastectomy , right axillary lymph node dissection left axillary sentinel node biopsy, removal of Porta Cath  . Simple mastectomy with axillary sentinel node biopsy Left 05/18/2012    Procedure: SIMPLE MASTECTOMY WITH AXILLARY SENTINEL NODE BIOPSY;  Surgeon: Ernestene Mention, MD;  Location: [facility] OR;  Service: General;  Laterality: Left;  . Port-a-cath removal N/A 05/18/2012    Procedure: REMOVAL PORT-A-CATH;  Surgeon: Ernestene Mention, MD;  Location: [facility] OR;  Service: General;  Laterality: N/A;    REVIEW OF SYSTEMS:   General: fatigue (+), night sweats (-), fever (-), pain (-) Lymph: palpable nodes (-) HEENT: vision changes (-), mucositis (-), gum bleeding (-), epistaxis (-) Cardiovascular: chest pain (-), palpitations (-) Pulmonary: shortness of breath (-), dyspnea on exertion (-), cough (-), hemoptysis (-) GI:  Early satiety (-), melena (-), dysphagia (-), nausea/vomiting (-), diarrhea (-) GU: dysuria (-), hematuria (-), incontinence (-) Musculoskeletal: joint swelling (-), joint pain (-), back pain (-) Neuro: weakness (-), numbness (-), headache (-), confusion (-) Skin: Rash (-), lesions (-), dryness (-) Psych: depression (-), suicidal/homicidal ideation (-), feeling of  hopelessness (-)   PHYSICAL EXAMINATION:  BP 145/88  Pulse 79  Temp(Src) 98.2 F (36.8 C) (Oral)  Resp 20  Ht 5\' 4"  (1.626 m)  Wt 157 lb 1.6 oz (71.26 kg)  BMI 26.95 kg/m2 General: Patient is a well appearing female in no acute distress HEENT: PERRLA, sclerae anicteric no conjunctival pallor, MMM Neck: supple, no palpable adenopathy Lungs: clear to auscultation bilaterally, no wheezes, rhonchi, or rales Cardiovascular: regular rate rhythm, S1, S2, no murmurs, rubs or gallops Abdomen: Soft, non-tender, non-distended, normoactive bowel sounds, no HSM Extremities: warm and well perfused, no clubbing, cyanosis, or edema Skin: No lesions or rash Neuro: Non-focal Breast:status post bilateral mastectomies with JP drains in place.  ECOG PERFORMANCE STATUS: 0 -  Asymptomatic   LABORATORY DATA: Lab Results  Component Value Date   WBC 5.3 08/20/2012   HGB 13.6 08/20/2012   HCT 41.7 08/20/2012   MCV 91.9 08/20/2012   PLT 235 08/20/2012      Chemistry      Component Value Date/Time   NA 144 08/20/2012 0802   NA 141 05/19/2012 0710   K 4.5 08/20/2012 0802   K 4.1 05/19/2012 0710   CL 106 08/20/2012 0802   CL 107 05/19/2012 0710   CO2 28 08/20/2012 0802   CO2 25 05/19/2012 0710   BUN 19.4 08/20/2012 0802   BUN 9 05/19/2012 0710   CREATININE 0.8 08/20/2012 0802   CREATININE 0.77 05/19/2012 0710   CREATININE 0.82 05/20/2011 1204      Component Value Date/Time   CALCIUM 9.7 08/20/2012 0802   CALCIUM 8.7 05/19/2012 0710   ALKPHOS 99 08/20/2012 0802   ALKPHOS 80 05/20/2011 1204   AST 17 08/20/2012 0802   AST 21 05/20/2011 1204   ALT 17 08/20/2012 0802   ALT 29 05/20/2011 1204   BILITOT 0.31 08/20/2012 0802   BILITOT 0.3 05/20/2011 1204    ADDITIONAL INFORMATION: 4. CHROMOGENIC IN-SITU HYBRIDIZATION (BLOCK 4C) Interpretation HER-2/NEU BY CISH - NO AMPLIFICATION OF HER-2 DETECTED. THE RATIO OF HER-2: CEP 17 SIGNALS WAS 1.36. Reference range: Ratio: HER2:CEP17 < 1.8 - gene amplification not observed Ratio: HER2:CEP 17  1.8-2.2 - equivocal result Ratio: HER2:CEP17 > 2.2 - gene amplification observed CHROMOGENIC IN-SITU HYBRIDIZATION (BLOCK 4E) Interpretation HER-2/NEU BY CISH - NO AMPLIFICATION OF HER-2 DETECTED. THE RATIO OF HER-2: CEP 17 SIGNALS WAS 0.90. Reference range: Ratio: HER2:CEP17 < 1.8 - gene amplification not observed Ratio: HER2:CEP 17 1.8-2.2 - equivocal result Ratio: HER2:CEP17 > 2.2 - gene amplification observed Pecola Leisure MD Pathologist, Electronic Signature ( Signed 05/28/2012) FINAL DIAGNOSIS Diagnosis 1. Lymph node, sentinel, biopsy, Left axilla #1 - ONE LYMPH NODE, NEGATIVE FOR TUMOR (0/1). 1 of 5 FINAL for MYEASHA, BALLOWE A [phone]) Diagnosis(continued) - SEE COMMENT. 2. Lymph node, sentinel, biopsy, Left axilla #2 - ONE LYMPH NODE, NEGATIVE FOR TUMOR (0/1). - SEE COMMENT. 3. Breast, simple mastectomy, Left - DUCTAL CARCINOMA IN SITU, SEE COMMENT. - IN SITU CARCINOMA IS 1.5 CM FROM THE NEAREST MARGIN (DEEP). - ONE LYMPH NODE, NEGATIVE FOR TUMOR (0/1). - SEE TUMOR SYNOPTIC TEMPLATE BELOW. 4. Breast, simple mastectomy, Right, with axillary contents - INVASIVE LOBULAR CARCINOMA (2.3 CM), SEE COMMENT. - INVASIVE TUMOR IS 2.8 CM FROM THE NEAREST MARGIN (DEEP). - BENIGN NIPPLE, NEGATIVE FOR TUMOR. - LOBULAR CARCINOMA IN SITU. - EIGHT LYMPH NODES, POSITIVE FOR METASTATIC MAMMARY CARCINOMA (8/17). - NO EXTRACAPSULAR TUMOR EXTENSION IDENTIFIED. - SEE TUMOR SYNOPTIC TEMPLATE BELOW. 5. Lymph nodes, regional resection, Additional Right Axillary - ONE LYMPH NODE, POSITIVE FOR ISOLATED TUMOR CELLS (0/1) SEE COMMENT Microscopic Comment 1. and 2. There are no intranodal metastatic tumor deposits identified with cytokeratin AE1/AE3 immunostain. 3. BREAST, IN SITU CARCINOMA Specimen, including laterality: Left breast. Procedure: Simple mastectomy. Grade of carcinoma: II (grading limited by neoadjuvant-related change). Necrosis: Absent. Estimated tumor size: (glass slide  measurement): 0.3 cm. Treatment effect: Present. If present, treatment effect in breast tissue, lymph nodes or both: Breast tissue. Distance to closest margin: 1.5 cm. If margin positive, focally or broadly: N/A. Breast prognostic profile: Estrogen and progesterone receptor: Not repeated; previous study demonstrated 81% for estrogen and 74% for progesterone ([id number]). Lymph nodes: Examined: 3 (Parts 1, 2 and 3). Lymph nodes with metastasis: 0. TNM: ypTis, pN0. 4. BREAST, INVASIVE  TUMOR, WITH LYMPH NODE SAMPLING Specimen, including laterality: Right breast. Procedure: Simple mastectomy. Grade: II of III. Tubule formation: 3. Nuclear pleomorphism: 2. Mitotic: 1. Tumor size (gross measurement): 2.3 cm. Margins: Invasive, distance to closest margin: 1.5 cm. In situ, distance to closest margin: 1.5 cm (deep). If margin positive, focally or broadly: None. Lymphovascular invasion: Absent. 2 of 5 FINAL for EYANNA, MCGONAGLE A [id number]) Microscopic Comment(continued) Ductal carcinoma in situ: None. Grade: N/A. Extensive intraductal component: N/A. Lobular neoplasia: Present, in situ carcinoma. Tumor focality: Unifocal. Treatment effect: Present. If present, treatment effect in breast tissue, lymph nodes or both: Both Extent of tumor: Skin: Negative. Nipple: Negative. Skeletal muscle: Negative. Lymph nodes: # examined: 18 (Parts 4 and 5). Lymph nodes with metastasis: 8 Isolated tumor cells (< 0.2 mm): 5 see comment Micrometastasis: (> 0.2 mm and < 2.0 mm): 0. Macrometastasis: (> 2.0 mm): 0.2 cm, 0.8 cm, 0.3 cm, 0.3 cm, 0.3 cm, 0.9 cm, and 1.3 cm. Extracapsular extension: Absent. Breast prognostic profile: Estrogen and Progesterone receptor: Not repeated; previous study demonstrated 95% for estrogen and 53 % for progesterone ([id number]). Ki-67: Not repeated; previous study demonstrated 85% proliferation rate ([id number]). HER-2/neu: Repeated; previous study demonstrated  no amplification (1.21) ([id number]). Non-neoplastic breast: Multiple complex sclerosing lesions/radial scars, fibroadenomatoid nodules, fibrocystic change, and microcalcifications in benign ducts and lobules. TNM: ypT2, pN2a, pMX. (CRR:eps 05/21/12) Comment: There are isolated tumor cells present in a total of five lymph nodes from part 4 (slides 4K, 4R, and 12M) and part 5 (Slide 5A). The isolated tumor cells were identified with cytokeratin AE1/3 immunostain. 5. The isolated tumor cells were identified with cytokeratin AE1/3 immunostain.    RADIOGRAPHIC STUDIES:  ASSESSMENT: 52 year old female with  #1 T3, N1, 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 Patient is now going to receive neoadjuvant chemotherapy consisting of FEC 100. She will receive dose dense FEC with day 2 Neulasta. She is overall doing well she understands the rationale for neoadjuvant treatment she understands the side effects of her chemotherapy. She also has her antiemetics already at her disposal to take as directed.  #2 patient has now completed 4 cycles of neoadjuvant FEC 100.  #3 she received single agent Taxol weekly from  02/02/2012 hrough 04/20/2012..    #4 patient is status post bilateral mastectomies performed on 05/18/2012. She had a right axillary lymph node dissection and left sentinel lymph node biopsy. Her port was removed also. Her final pathology on the right revealed invasive  Lobular carcinoma measuring 2.3 cm 8 of 17 lymph nodes were positive (T2 N2A). 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  PLAN:  #1 patient is continuing radiation therapy. She will finish in in one week's time.  #2 once she completes radiation therapy LC her back and we will begin her on tamoxifen 20 mg daily.risks and benefits of antiestrogen therapy was discussed with the patient in detail today. Literature was also given to her.  #3 she will  continue Zoladex injections on a monthly basis. Eventually I will plan on obtaining hormone levels on her to see if she could be switched to an aromatase inhibitor such as letrozole.  All questions were answered. The patient knows to call the clinic with any problems, questions or concerns. We can certainly see the patient much sooner if necessary.  I spent 25 minutes counseling the patient face to face. The total time spent in the appointment was 30 minutes.  Drue Second, MD Medical/Oncology [facility] [phone] (beeper) [phone] (Office)

## 2012-08-27 ENCOUNTER — Ambulatory Visit (HOSPITAL_BASED_OUTPATIENT_CLINIC_OR_DEPARTMENT_OTHER): Payer: 59

## 2012-08-27 VITALS — BP 165/74 | HR 74 | Temp 98.4°F

## 2012-08-27 DIAGNOSIS — C50411 Malignant neoplasm of upper-outer quadrant of right female breast: Secondary | ICD-10-CM

## 2012-08-27 DIAGNOSIS — Z5111 Encounter for antineoplastic chemotherapy: Secondary | ICD-10-CM

## 2012-08-27 DIAGNOSIS — C50419 Malignant neoplasm of upper-outer quadrant of unspecified female breast: Secondary | ICD-10-CM

## 2012-08-27 DIAGNOSIS — C50912 Malignant neoplasm of unspecified site of left female breast: Secondary | ICD-10-CM

## 2012-08-27 MED ORDER — GOSERELIN ACETATE 3.6 MG ~~LOC~~ IMPL
3.6000 mg | DRUG_IMPLANT | Freq: Once | SUBCUTANEOUS | Status: AC
  Administered 2012-08-27: 3.6 mg via SUBCUTANEOUS
  Filled 2012-08-27: qty 3.6

## 2012-08-27 NOTE — Patient Instructions (Addendum)
Call MD for problems 

## 2012-08-28 ENCOUNTER — Telehealth: Payer: Self-pay | Admitting: *Deleted

## 2012-08-28 NOTE — Telephone Encounter (Signed)
Lm informing the pt that we are closed on 11/19/12. gv appt d/t for 11/20/12@ 11am. i also made the pt aware that i will mail a letter/cal as well...td

## 2012-09-05 ENCOUNTER — Ambulatory Visit (INDEPENDENT_AMBULATORY_CARE_PROVIDER_SITE_OTHER): Payer: Commercial Managed Care - PPO | Admitting: General Surgery

## 2012-09-05 ENCOUNTER — Encounter (INDEPENDENT_AMBULATORY_CARE_PROVIDER_SITE_OTHER): Payer: Self-pay | Admitting: General Surgery

## 2012-09-05 VITALS — BP 110/74 | HR 67 | Temp 98.8°F | Resp 16 | Ht 64.0 in | Wt 156.4 lb

## 2012-09-05 DIAGNOSIS — C50411 Malignant neoplasm of upper-outer quadrant of right female breast: Secondary | ICD-10-CM

## 2012-09-05 DIAGNOSIS — C50419 Malignant neoplasm of upper-outer quadrant of unspecified female breast: Secondary | ICD-10-CM

## 2012-09-05 NOTE — Patient Instructions (Signed)
You have recovered from your bilateral mastectomy surgery without any obvious surgical complications.  There is no evidence of cancer on physical exam today.  The radiation changes on the right side are subsiding normally.  Continue to take the tamoxifen and keep all of your appointment to Dr. Drue Second.  Return to see Dr. Derrell Lolling in February, 2015.  Contact Dr. Etter Sjogren when you are ready to discuss reconstruction.

## 2012-09-05 NOTE — Progress Notes (Signed)
Patient ID: Mary [name], female   DOB: 1960-04-22, 52 y.o.   MRN: [id number] History:The patient returns following radiation therapy to the right breast.   She states she is feeling much better. The pain has essentially resolved..  Recall that she underwent neoadjuvant chemotherapy for locally advanced cancer of the right breast, upper outer quadrant, receptor positive, HER-2-negative, T3 N1 on the right and DCIS in the left.  On May 18, 2012  she underwent right modified radical mastectomy, left total mastectomy and sentinel biopsy and removal of Port-A-Cath.  Pathology on the right showed invasive lobular carcinoma, ypT2, ypN2a, 8 of 17 lymph nodes positive, there was good downstaging of the primary tumor. Left side showed DCIS. She's feeling pretty good now. She has full range of motion of her shoulders. She is fully reactive without restriction. Chiseled of numbness of her right upper arm but no arm swelling. In terms of breast reconstruction, she is still somewhat interested, and states she will return to see Mary [name] in the future when she is ready, but not now.  Exam: Patient looks good. No distress. Neck be no adenopathy or mass Breasts bilateral mastectomy incisions well healed. No nodules, ulceration, or fluid collection. Radiation therapy changes on the right with erythema but skin healthy. Port-A-Cath site also well healed. No axillary mass. Range of motion 100%, both shoulders  Assessment: Receptor positive, HER-2-negative invasive lobular carcinoma right breast, locally advanced ypT2, ypN2a DCIS left breast Uneventful healing following right modified radical mastectomy and left total mastectomy and sentinel node biopsy and removal of Port-A-Cath. Date of surgery February, 2014  Plan: Continue tamoxifen and close clinical followup with Mary [name]/ Mary [name] She will contact Mary [name] when she feels ready to have further consultative advice regarding  reconstruction Return to see me in February, 2015.   Angelia Mould. Derrell Lolling, M.D., [facility] General and Minimally invasive Surgery Breast and Colorectal Surgery Office:   [phone] Pager:   [phone]

## 2012-09-07 LAB — ESTRADIOL, ULTRA SENS: Estradiol, Ultra Sensitive: 14 pg/mL

## 2012-09-10 NOTE — Progress Notes (Signed)
OFFICE PROGRESS NOTE  CC  TULLO,TERESA, MD [address] [place] [address] Dr. Chipper Herb Dr. Jane Canary  DIAGNOSIS: 52 year old female with diagnosis of stage IIIA T3, N1, M0 invasive lobular carcinoma, Of the right breast with DCIS of the the left breast. Essentially patient has bilateral breast cancers.  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   #2 patient has begun FEC 100 starting on 12/08/2011 - 01/18/12. A total of 4 cycles of this chemotherapy is planned and then thereafter she will proceed with Taxol as a single agent for total of 12 weeks.  #3 patient is status post single agent Taxol x12 weeks from 02/02/2012 through January 2014.  #4patient is status post bilateral mastectomies performed on 05/18/2012. She had a right axillary lymph node dissection and left sentinel lymph node biopsy. Her port was removed also. Her final pathology on the right revealed invasive  Lobular carcinoma measuring 2.3 cm 8 of 17 lymph nodes were positive (T2 N2A). 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  5 completed her radiation therapy she overall tolerated very well. Her treatments were administered from 07/04/2012 through 08/17/2012.  #6 she is now to begin tamoxifen 20 mg daily. Total of 5-10 years as plannedCURRENT THERAPY   INTERVAL HISTORY: KITTIE KRIZAN 52 y.o. female returns for follow-up visit today. She is continuing radiation therapy. She is tolerating it quite well she is certainly does have erythema to the chest wall due to radiation therapy. She is denying any nausea vomiting fevers chills night sweats headaches shortness of breath chest pains palpitations Remainder of the 10 point review of systems is negative.  MEDICAL HISTORY: Past Medical History  Diagnosis Date   . Irritable bowel syndrome   . Dental crowns present     x 4  . Neutropenia, drug-induced 12/15/2011  . Breast cancer August 2013    bilateral, er/pr+  . Complication of anesthesia     pt states she had the shakes extremely bad  . Cough     at night d/t reflux;nothing productive  . History of bronchitis     08/2011  . GERD (gastroesophageal reflux disease)     takes Nexium daily  . IBS (irritable bowel syndrome)     immodium prn  . History of colon polyps   . Urinary frequency   . Nocturia   . Insomnia     takes Ambien nightly  . Anxiety     takes Xanax prn  . Plantar fasciitis   . Hot flashes, menopausal 08/08/2012    ALLERGIES:  is allergic to adhesive.  MEDICATIONS:  Current Outpatient Prescriptions  Medication Sig Dispense Refill  . ALPRAZolam (XANAX) 0.25 MG tablet TAKE ONE TABLET BY MOUTH TWICE DAILY AS NEEDED FOR ANXIETY  60 tablet  4  . Ascorbic Acid (VITAMIN C) 100 MG tablet Take 100 mg by mouth daily.        Marland Kitchen b complex vitamins tablet Take 1 tablet by mouth daily.       . Calcium Carbonate-Vitamin D (CALTRATE 600+D PO) Take 1 tablet by mouth daily.      . cholecalciferol (VITAMIN D) 400 UNITS TABS Take 400 Units by mouth daily.       Marland Kitchen esomeprazole (NEXIUM) 40 MG capsule Take 40 mg by mouth daily before breakfast.      .  loperamide (IMODIUM A-D) 2 MG tablet Take 2 mg by mouth 4 (four) times daily as needed for diarrhea or loose stools.       Marland Kitchen LORazepam (ATIVAN) 0.5 MG tablet TAKE ONE TABLET BY MOUTH EVERY 8 HOURS  60 tablet  0  . Multiple Vitamin (MULTIVITAMIN) tablet Take 1 tablet by mouth daily.        Marland Kitchen venlafaxine XR (EFFEXOR-XR) 37.5 MG 24 hr capsule Take 1 capsule (37.5 mg total) by mouth daily.  30 capsule  6  . vitamin B-12 (CYANOCOBALAMIN) 100 MCG tablet Take 100 mcg by mouth daily.       . tamoxifen (NOLVADEX) 20 MG tablet Take 1 tablet (20 mg total) by mouth daily.  30 tablet  2   No current facility-administered medications for this visit.     SURGICAL HISTORY:  Past Surgical History  Procedure Laterality Date  . Portacath placement  12/02/2011    Procedure: INSERTION PORT-A-CATH;  Surgeon: Ernestene Mention, MD;  Location: [facility];  Service: General;  Laterality: N/A;  insertion port a cath with fluoroscopy  . Breast lumpectomy  2008    left  . Colonoscopy    . Mastectomy with axillary lymph node dissection Right 05/18/2012    Procedure: MASTECTOMY WITH AXILLARY LYMPH NODE DISSECTION;  Surgeon: Ernestene Mention, MD;  Location: [facility] OR;  Service: General;  Laterality: Right;  Bilateral Total Mastectomy , right axillary lymph node dissection left axillary sentinel node biopsy, removal of Porta Cath  . Simple mastectomy with axillary sentinel node biopsy Left 05/18/2012    Procedure: SIMPLE MASTECTOMY WITH AXILLARY SENTINEL NODE BIOPSY;  Surgeon: Ernestene Mention, MD;  Location: [facility] OR;  Service: General;  Laterality: Left;  . Port-a-cath removal N/A 05/18/2012    Procedure: REMOVAL PORT-A-CATH;  Surgeon: Ernestene Mention, MD;  Location: [facility] OR;  Service: General;  Laterality: N/A;    REVIEW OF SYSTEMS:   General: fatigue (+), night sweats (-), fever (-), pain (-) Lymph: palpable nodes (-) HEENT: vision changes (-), mucositis (-), gum bleeding (-), epistaxis (-) Cardiovascular: chest pain (-), palpitations (-) Pulmonary: shortness of breath (-), dyspnea on exertion (-), cough (-), hemoptysis (-) GI:  Early satiety (-), melena (-), dysphagia (-), nausea/vomiting (-), diarrhea (-) GU: dysuria (-), hematuria (-), incontinence (-) Musculoskeletal: joint swelling (-), joint pain (-), back pain (-) Neuro: weakness (-), numbness (-), headache (-), confusion (-) Skin: Rash (-), lesions (-), dryness (-) Psych: depression (-), suicidal/homicidal ideation (-), feeling of hopelessness (-)   PHYSICAL EXAMINATION:  BP 146/82  Pulse 73  Temp(Src) 97.8 F (36.6 C) (Oral)  Resp 20  Ht 5\' 4"  (1.626 m)  Wt 157 lb 4.8 oz  (71.351 kg)  BMI 26.99 kg/m2 General: Patient is a well appearing female in no acute distress HEENT: PERRLA, sclerae anicteric no conjunctival pallor, MMM Neck: supple, no palpable adenopathy Lungs: clear to auscultation bilaterally, no wheezes, rhonchi, or rales Cardiovascular: regular rate rhythm, S1, S2, no murmurs, rubs or gallops Abdomen: Soft, non-tender, non-distended, normoactive bowel sounds, no HSM Extremities: warm and well perfused, no clubbing, cyanosis, or edema Skin: No lesions or rash Neuro: Non-focal Breast:status post bilateral mastectomies with JP drains in place.  ECOG PERFORMANCE STATUS: 0 - Asymptomatic   LABORATORY DATA: Lab Results  Component Value Date   WBC 5.3 08/20/2012   HGB 13.6 08/20/2012   HCT 41.7 08/20/2012   MCV 91.9 08/20/2012   PLT 235 08/20/2012      Chemistry  Component Value Date/Time   NA 144 08/20/2012 0802   NA 141 05/19/2012 0710   K 4.5 08/20/2012 0802   K 4.1 05/19/2012 0710   CL 106 08/20/2012 0802   CL 107 05/19/2012 0710   CO2 28 08/20/2012 0802   CO2 25 05/19/2012 0710   BUN 19.4 08/20/2012 0802   BUN 9 05/19/2012 0710   CREATININE 0.8 08/20/2012 0802   CREATININE 0.77 05/19/2012 0710   CREATININE 0.82 05/20/2011 1204      Component Value Date/Time   CALCIUM 9.7 08/20/2012 0802   CALCIUM 8.7 05/19/2012 0710   ALKPHOS 99 08/20/2012 0802   ALKPHOS 80 05/20/2011 1204   AST 17 08/20/2012 0802   AST 21 05/20/2011 1204   ALT 17 08/20/2012 0802   ALT 29 05/20/2011 1204   BILITOT 0.31 08/20/2012 0802   BILITOT 0.3 05/20/2011 1204    ADDITIONAL INFORMATION: 4. CHROMOGENIC IN-SITU HYBRIDIZATION (BLOCK 4C) Interpretation HER-2/NEU BY CISH - NO AMPLIFICATION OF HER-2 DETECTED. THE RATIO OF HER-2: CEP 17 SIGNALS WAS 1.36. Reference range: Ratio: HER2:CEP17 < 1.8 - gene amplification not observed Ratio: HER2:CEP 17 1.8-2.2 - equivocal result Ratio: HER2:CEP17 > 2.2 - gene amplification observed CHROMOGENIC IN-SITU HYBRIDIZATION (BLOCK 4E) Interpretation HER-2/NEU BY  CISH - NO AMPLIFICATION OF HER-2 DETECTED. THE RATIO OF HER-2: CEP 17 SIGNALS WAS 0.90. Reference range: Ratio: HER2:CEP17 < 1.8 - gene amplification not observed Ratio: HER2:CEP 17 1.8-2.2 - equivocal result Ratio: HER2:CEP17 > 2.2 - gene amplification observed Pecola Leisure MD Pathologist, Electronic Signature ( Signed 05/28/2012) FINAL DIAGNOSIS Diagnosis 1. Lymph node, sentinel, biopsy, Left axilla #1 - ONE LYMPH NODE, NEGATIVE FOR TUMOR (0/1). 1 of 5 FINAL for ROSALENE, WARDROP A [phone]) Diagnosis(continued) - SEE COMMENT. 2. Lymph node, sentinel, biopsy, Left axilla #2 - ONE LYMPH NODE, NEGATIVE FOR TUMOR (0/1). - SEE COMMENT. 3. Breast, simple mastectomy, Left - DUCTAL CARCINOMA IN SITU, SEE COMMENT. - IN SITU CARCINOMA IS 1.5 CM FROM THE NEAREST MARGIN (DEEP). - ONE LYMPH NODE, NEGATIVE FOR TUMOR (0/1). - SEE TUMOR SYNOPTIC TEMPLATE BELOW. 4. Breast, simple mastectomy, Right, with axillary contents - INVASIVE LOBULAR CARCINOMA (2.3 CM), SEE COMMENT. - INVASIVE TUMOR IS 2.8 CM FROM THE NEAREST MARGIN (DEEP). - BENIGN NIPPLE, NEGATIVE FOR TUMOR. - LOBULAR CARCINOMA IN SITU. - EIGHT LYMPH NODES, POSITIVE FOR METASTATIC MAMMARY CARCINOMA (8/17). - NO EXTRACAPSULAR TUMOR EXTENSION IDENTIFIED. - SEE TUMOR SYNOPTIC TEMPLATE BELOW. 5. Lymph nodes, regional resection, Additional Right Axillary - ONE LYMPH NODE, POSITIVE FOR ISOLATED TUMOR CELLS (0/1) SEE COMMENT Microscopic Comment 1. and 2. There are no intranodal metastatic tumor deposits identified with cytokeratin AE1/AE3 immunostain. 3. BREAST, IN SITU CARCINOMA Specimen, including laterality: Left breast. Procedure: Simple mastectomy. Grade of carcinoma: II (grading limited by neoadjuvant-related change). Necrosis: Absent. Estimated tumor size: (glass slide measurement): 0.3 cm. Treatment effect: Present. If present, treatment effect in breast tissue, lymph nodes or both: Breast tissue. Distance to closest margin:  1.5 cm. If margin positive, focally or broadly: N/A. Breast prognostic profile: Estrogen and progesterone receptor: Not repeated; previous study demonstrated 81% for estrogen and 74% for progesterone ([id number]). Lymph nodes: Examined: 3 (Parts 1, 2 and 3). Lymph nodes with metastasis: 0. TNM: ypTis, pN0. 4. BREAST, INVASIVE TUMOR, WITH LYMPH NODE SAMPLING Specimen, including laterality: Right breast. Procedure: Simple mastectomy. Grade: II of III. Tubule formation: 3. Nuclear pleomorphism: 2. Mitotic: 1. Tumor size (gross measurement): 2.3 cm. Margins: Invasive, distance to closest margin: 1.5 cm. In situ, distance to closest margin: 1.5 cm (  deep). If margin positive, focally or broadly: None. Lymphovascular invasion: Absent. 2 of 5 FINAL for TERRILYNN, POSTELL A [phone]) Microscopic Comment(continued) Ductal carcinoma in situ: None. Grade: N/A. Extensive intraductal component: N/A. Lobular neoplasia: Present, in situ carcinoma. Tumor focality: Unifocal. Treatment effect: Present. If present, treatment effect in breast tissue, lymph nodes or both: Both Extent of tumor: Skin: Negative. Nipple: Negative. Skeletal muscle: Negative. Lymph nodes: # examined: 18 (Parts 4 and 5). Lymph nodes with metastasis: 8 Isolated tumor cells (< 0.2 mm): 5 see comment Micrometastasis: (> 0.2 mm and < 2.0 mm): 0. Macrometastasis: (> 2.0 mm): 0.2 cm, 0.8 cm, 0.3 cm, 0.3 cm, 0.3 cm, 0.9 cm, and 1.3 cm. Extracapsular extension: Absent. Breast prognostic profile: Estrogen and Progesterone receptor: Not repeated; previous study demonstrated 95% for estrogen and 53 % for progesterone ([id number]). Ki-67: Not repeated; previous study demonstrated 85% proliferation rate ([id number]). HER-2/neu: Repeated; previous study demonstrated no amplification (1.21) ([id number]). Non-neoplastic breast: Multiple complex sclerosing lesions/radial scars, fibroadenomatoid nodules, fibrocystic change, and  microcalcifications in benign ducts and lobules. TNM: ypT2, pN2a, pMX. (CRR:eps 05/21/12) Comment: There are isolated tumor cells present in a total of five lymph nodes from part 4 (slides 4K, 4R, and 45M) and part 5 (Slide 5A). The isolated tumor cells were identified with cytokeratin AE1/3 immunostain. 5. The isolated tumor cells were identified with cytokeratin AE1/3 immunostain.    RADIOGRAPHIC STUDIES:  ASSESSMENT: 52 year old female with  #1 T3, N1, 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 Patient is now going to receive neoadjuvant chemotherapy consisting of FEC 100. She will receive dose dense FEC with day 2 Neulasta. She is overall doing well she understands the rationale for neoadjuvant treatment she understands the side effects of her chemotherapy. She also has her antiemetics already at her disposal to take as directed.  #2 patient has now completed 4 cycles of neoadjuvant FEC 100.  #3 she received single agent Taxol weekly from  02/02/2012 hrough 04/20/2012..    #4 patient is status post bilateral mastectomies performed on 05/18/2012. She had a right axillary lymph node dissection and left sentinel lymph node biopsy. Her port was removed also. Her final pathology on the right revealed invasive  Lobular carcinoma measuring 2.3 cm 8 of 17 lymph nodes were positive (T2 N2A). 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 She tolerated it well.  #6 patient will proceed with tamoxifen 20 mg daily for a total of 5-10 years.Marland KitchenMarland KitchenI am starting tamoxifen initially since she is premenopausal. However I do think we need to check her hormone levels to see if she has become postmenopausal and if she has then certainly she would be a candidate for an aromatase inhibitor. She and I also discussed the possibility of her being made  postmenopausal with Zoladex and then started her on an aromatase inhibitor such as letrozole. I will see what her hormone levels are. Risks and benefits of treatment were discussed with the patient in detail.   PLAN:  #1 begin tamoxifen 20 mg daily  #2 she will continue Zoladex injections on a monthly basis. Eventually I will plan on obtaining hormone levels on her to see if she could be switched to an aromatase inhibitor such as letrozole.  #3 patient will return in 3 months time for followup.  All questions were answered. The patient knows to call the  clinic with any problems, questions or concerns. We can certainly see the patient much sooner if necessary.  I spent 25 minutes counseling the patient face to face. The total time spent in the appointment was 30 minutes.  Drue Second, MD Medical/Oncology [facility] [phone] (beeper) [phone] (Office)

## 2012-09-24 ENCOUNTER — Encounter: Payer: Self-pay | Admitting: Radiation Oncology

## 2012-09-24 ENCOUNTER — Ambulatory Visit: Payer: 59

## 2012-09-25 ENCOUNTER — Ambulatory Visit
Admission: RE | Admit: 2012-09-25 | Discharge: 2012-09-25 | Disposition: A | Discharge status: Home / Self Care | Payer: 59 | Source: Ambulatory Visit | Attending: Radiation Oncology | Admitting: Radiation Oncology

## 2012-09-25 VITALS — BP 138/89 | HR 86 | Temp 98.5°F | Ht 64.0 in | Wt 155.8 lb

## 2012-09-25 DIAGNOSIS — C50411 Malignant neoplasm of upper-outer quadrant of right female breast: Secondary | ICD-10-CM

## 2012-09-25 HISTORY — DX: Personal history of antineoplastic chemotherapy: Z92.21

## 2012-09-25 HISTORY — DX: Long term (current) use of selective estrogen receptor modulators (serms): Z79.810

## 2012-09-25 HISTORY — DX: Personal history of irradiation: Z92.3

## 2012-09-25 NOTE — Progress Notes (Signed)
Mary [name] is here for follow up after treatment to her right breast.  She denies pain.  She does have a small red area at the beginning of her incision on her right chest wall.  She says that it has been there for a week and that it doesn't hurt or itch.  She has been applying antibiotic ointment to it.  She also is applying radiaplex gel and cetaphil cream to her right chest.  She does have occasional fatigue.

## 2012-09-25 NOTE — Progress Notes (Signed)
Followup note:  Mary [name] visits today approximately 6 weeks following completion of radiation therapy to her right chest wall and regional lymph nodes in the management of her stage IIIA invasive lobular carcinoma the right breast. She also had a mastectomy for DCIS of the left breast. She is without complaints today. She saw Mary [name] on June 18, she sees Mary [name] tomorrow. She did see Mary [name] for discussion of reconstruction options prior to her mastectomies.  Physical examination: Alert and oriented. Filed Vitals:   09/25/12 1057  BP: 138/89  Pulse: 86  Temp: 98.5 F (36.9 C)    Head and neck examination: She has regrowth of hair. Nodes: There is no palpable cervical, supraclavicular, or axillary lymphadenopathy. Chest: Bilateral mastectomies. There is residual hyperpigmentation/erythema the skin along the right chest wall. No palpable or visible evidence for recurrent disease. Lungs clear. Abdomen: Without hepatomegaly. Extremities: Without lymphedema.  Impression: Satisfactory progress. She tells me that she is undecided about breast reconstruction. She'll see Mary [name] when she has made a decision.  Plan: She will see Mary [name] early next year and see Mary [name] tomorrow.

## 2012-09-26 ENCOUNTER — Ambulatory Visit (HOSPITAL_BASED_OUTPATIENT_CLINIC_OR_DEPARTMENT_OTHER): Payer: 59 | Admitting: Oncology

## 2012-09-26 ENCOUNTER — Telehealth: Payer: Self-pay | Admitting: Oncology

## 2012-09-26 ENCOUNTER — Encounter: Payer: Self-pay | Admitting: Oncology

## 2012-09-26 ENCOUNTER — Ambulatory Visit (HOSPITAL_BASED_OUTPATIENT_CLINIC_OR_DEPARTMENT_OTHER): Payer: 59

## 2012-09-26 ENCOUNTER — Other Ambulatory Visit (HOSPITAL_BASED_OUTPATIENT_CLINIC_OR_DEPARTMENT_OTHER): Payer: 59 | Admitting: Lab

## 2012-09-26 VITALS — BP 130/78 | HR 73 | Temp 98.1°F | Resp 20 | Ht 64.0 in | Wt 157.2 lb

## 2012-09-26 DIAGNOSIS — C50419 Malignant neoplasm of upper-outer quadrant of unspecified female breast: Secondary | ICD-10-CM

## 2012-09-26 DIAGNOSIS — C773 Secondary and unspecified malignant neoplasm of axilla and upper limb lymph nodes: Secondary | ICD-10-CM

## 2012-09-26 DIAGNOSIS — Z170 Estrogen receptor positive status [ER+]: Secondary | ICD-10-CM

## 2012-09-26 DIAGNOSIS — Z5111 Encounter for antineoplastic chemotherapy: Secondary | ICD-10-CM

## 2012-09-26 DIAGNOSIS — C50411 Malignant neoplasm of upper-outer quadrant of right female breast: Secondary | ICD-10-CM

## 2012-09-26 DIAGNOSIS — D0590 Unspecified type of carcinoma in situ of unspecified breast: Secondary | ICD-10-CM

## 2012-09-26 DIAGNOSIS — C50912 Malignant neoplasm of unspecified site of left female breast: Secondary | ICD-10-CM

## 2012-09-26 LAB — CBC WITH DIFFERENTIAL/PLATELET
BASO%: 0.9 % (ref 0.0–2.0)
EOS%: 2.5 % (ref 0.0–7.0)
MCH: 31.8 pg (ref 25.1–34.0)
MCHC: 34.2 g/dL (ref 31.5–36.0)
MCV: 93 fL (ref 79.5–101.0)
MONO%: 9.1 % (ref 0.0–14.0)
RBC: 4.35 10*6/uL (ref 3.70–5.45)
RDW: 13.8 % (ref 11.2–14.5)
lymph#: 0.9 10*3/uL (ref 0.9–3.3)

## 2012-09-26 LAB — COMPREHENSIVE METABOLIC PANEL (CC13)
ALT: 14 U/L (ref 0–55)
CO2: 30 mEq/L — ABNORMAL HIGH (ref 22–29)
Creatinine: 0.8 mg/dL (ref 0.6–1.1)
Total Bilirubin: 0.37 mg/dL (ref 0.20–1.20)

## 2012-09-26 MED ORDER — GOSERELIN ACETATE 3.6 MG ~~LOC~~ IMPL
3.6000 mg | DRUG_IMPLANT | Freq: Once | SUBCUTANEOUS | Status: AC
  Administered 2012-09-26: 3.6 mg via SUBCUTANEOUS
  Filled 2012-09-26: qty 3.6

## 2012-09-26 NOTE — Progress Notes (Signed)
OFFICE PROGRESS NOTE  CC  TULLO,TERESA, MD [address] [place] [address] Dr. Chipper Herb Dr. Jane Canary  DIAGNOSIS: 52 year old female with diagnosis of stage IIIA T3, N1, M0 invasive lobular carcinoma, Of the right breast with DCIS of the the left breast. Essentially patient has bilateral breast cancers.  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   #2 patient has begun FEC 100 starting on 12/08/2011 - 01/18/12. [name] total of 4 cycles of this chemotherapy is planned and then thereafter she will proceed with Taxol as [name] single agent for total of 12 weeks.  #3 patient is status post single agent Taxol x12 weeks from 02/02/2012 through January 2014.  #4patient is status post bilateral mastectomies performed on 05/18/2012. She had [name] right axillary lymph node dissection and left sentinel lymph node biopsy. Her port was removed also. Her final pathology on the right revealed invasive  Lobular carcinoma measuring 2.3 cm 8 of 17 lymph nodes were positive (T2 N2A). 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  5 completed her radiation therapy she overall tolerated very well. Her treatments were administered from 07/04/2012 through 08/17/2012.  #6 patient began tamoxifen 20 mg daily in June 2014. Overall she is tolerating it well. We will continue this for now  #7 patient is on Zoladex injections q. monthly and we are checking her hormone levels periodically  CURRENT THERAPY tamoxifen 20 mg daily  INTERVAL HISTORY: Mary [name] 52 y.o. female returns for follow-up visit today.she is tolerating tamoxifen quite well except for some fatigue and hot flashes. Her hair is growing back very nicely. She has no vaginal bleeding has not resumed her periods. She has not noticed any swelling in her  lower extremities. She is denying any nausea vomiting fevers chills night sweats headaches shortness of breath chest pains palpitations Remainder of the 10 point review of systems is negative.  MEDICAL HISTORY: Past Medical History  Diagnosis Date  . Irritable bowel syndrome   . Dental crowns present     x 4  . Neutropenia, drug-induced 12/15/2011  . Breast cancer August 2013    bilateral, er/pr+  . Complication of anesthesia     pt states she had the shakes extremely bad  . Cough     at night d/t reflux;nothing productive  . History of bronchitis     08/2011  . GERD (gastroesophageal reflux disease)     takes Nexium daily  . IBS (irritable bowel syndrome)     immodium prn  . History of colon polyps   . Urinary frequency   . Nocturia   . Insomnia     takes Ambien nightly  . Anxiety     takes Xanax prn  . Plantar fasciitis   . Hot flashes, menopausal 08/08/2012  . Status post chemotherapy     FEC 100 starting on 12/08/2011 - 01/18/12/single agent Taxol x12 weeks from 02/02/2012 through January 2014   . S/P radiation therapy 07/03/2012 through 08/18/2038                                                         Right chest wall/regional  lymph nodes 5040 cGy 28 sessions, right chest wall/mastectomy scar boost 1000 cGy 5 sessions                          . Use of tamoxifen (Nolvadex)     ALLERGIES:  is allergic to adhesive.  MEDICATIONS:  Current Outpatient Prescriptions  Medication Sig Dispense Refill  . ALPRAZolam (XANAX) 0.25 MG tablet TAKE ONE TABLET BY MOUTH TWICE DAILY AS NEEDED FOR ANXIETY  60 tablet  4  . Ascorbic Acid (VITAMIN C) 100 MG tablet Take 100 mg by mouth daily.        Marland Kitchen b complex vitamins tablet Take 1 tablet by mouth daily.       . Calcium Carbonate-Vitamin D (CALTRATE 600+D PO) Take 1 tablet by mouth daily.      . cholecalciferol (VITAMIN D) 400 UNITS TABS Take 400 Units by mouth daily.       Marland Kitchen esomeprazole (NEXIUM) 40 MG capsule Take 40 mg by mouth daily  before breakfast.      . loperamide (IMODIUM [name]-D) 2 MG tablet Take 2 mg by mouth 4 (four) times daily as needed for diarrhea or loose stools.       Marland Kitchen LORazepam (ATIVAN) 0.5 MG tablet TAKE ONE TABLET BY MOUTH EVERY 8 HOURS  60 tablet  0  . Multiple Vitamin (MULTIVITAMIN) tablet Take 1 tablet by mouth daily.        . tamoxifen (NOLVADEX) 20 MG tablet Take 1 tablet (20 mg total) by mouth daily.  30 tablet  2  . vitamin B-12 (CYANOCOBALAMIN) 100 MCG tablet Take 100 mcg by mouth daily.       Marland Kitchen venlafaxine XR (EFFEXOR-XR) 37.5 MG 24 hr capsule Take 1 capsule (37.5 mg total) by mouth daily.  30 capsule  6   No current facility-administered medications for this visit.    SURGICAL HISTORY:  Past Surgical History  Procedure Laterality Date  . Portacath placement  12/02/2011    Procedure: INSERTION PORT-[name]-CATH;  Surgeon: Ernestene Mention, MD;  Location: [facility];  Service: General;  Laterality: N/[name];  insertion port [name] cath with fluoroscopy  . Breast lumpectomy  2008    left  . Colonoscopy    . Mastectomy with axillary lymph node dissection Right 05/18/2012    Procedure: MASTECTOMY WITH AXILLARY LYMPH NODE DISSECTION;  Surgeon: Ernestene Mention, MD;  Location: [facility] OR;  Service: General;  Laterality: Right;  Bilateral Total Mastectomy , right axillary lymph node dissection left axillary sentinel node biopsy, removal of Porta Cath  . Simple mastectomy with axillary sentinel node biopsy Left 05/18/2012    Procedure: SIMPLE MASTECTOMY WITH AXILLARY SENTINEL NODE BIOPSY;  Surgeon: Ernestene Mention, MD;  Location: [facility] OR;  Service: General;  Laterality: Left;  . Port-[name]-cath removal N/[name] 05/18/2012    Procedure: REMOVAL PORT-[name]-CATH;  Surgeon: Ernestene Mention, MD;  Location: [facility] OR;  Service: General;  Laterality: N/[name];    REVIEW OF SYSTEMS:   General: fatigue (+), night sweats (-), fever (-), pain (-) Lymph: palpable nodes (-) HEENT: vision changes (-), mucositis (-), gum bleeding (-), epistaxis  (-) Cardiovascular: chest pain (-), palpitations (-) Pulmonary: shortness of breath (-), dyspnea on exertion (-), cough (-), hemoptysis (-) GI:  Early satiety (-), melena (-), dysphagia (-), nausea/vomiting (-), diarrhea (-) GU: dysuria (-), hematuria (-), incontinence (-) Musculoskeletal: joint swelling (-), joint pain (-), back pain (-) Neuro: weakness (-), numbness (-), headache (-),  confusion (-) Skin: Rash (-), lesions (-), dryness (-) Psych: depression (-), suicidal/homicidal ideation (-), feeling of hopelessness (-)   PHYSICAL EXAMINATION:  BP 130/78  Pulse 73  Temp(Src) 98.1 F (36.7 C) (Oral)  Resp 20  Ht 5\' 4"  (1.626 m)  Wt 157 lb 3.2 oz (71.305 kg)  BMI 26.97 kg/m2 General: Patient is [name] well appearing female in no acute distress HEENT: PERRLA, sclerae anicteric no conjunctival pallor, MMM Neck: supple, no palpable adenopathy Lungs: clear to auscultation bilaterally, no wheezes, rhonchi, or rales Cardiovascular: regular rate rhythm, S1, S2, no murmurs, rubs or gallops Abdomen: Soft, non-tender, non-distended, normoactive bowel sounds, no HSM Extremities: warm and well perfused, no clubbing, cyanosis, or edema Skin: No lesions or rash Neuro: Non-focal Breast:status post bilateral mastectomies with JP drains in place.  ECOG PERFORMANCE STATUS: 0 - Asymptomatic   LABORATORY DATA: Lab Results  Component Value Date   WBC 5.2 09/26/2012   HGB 13.8 09/26/2012   HCT 40.5 09/26/2012   MCV 93.0 09/26/2012   PLT 227 09/26/2012      Chemistry      Component Value Date/Time   NA 142 09/26/2012 0912   NA 141 05/19/2012 0710   K 4.0 09/26/2012 0912   K 4.1 05/19/2012 0710   CL 106 08/20/2012 0802   CL 107 05/19/2012 0710   CO2 30* 09/26/2012 0912   CO2 25 05/19/2012 0710   BUN 16.9 09/26/2012 0912   BUN 9 05/19/2012 0710   CREATININE 0.8 09/26/2012 0912   CREATININE 0.77 05/19/2012 0710   CREATININE 0.82 05/20/2011 1204      Component Value Date/Time   CALCIUM 9.6 09/26/2012 0912   CALCIUM 8.7  05/19/2012 0710   ALKPHOS 71 09/26/2012 0912   ALKPHOS 80 05/20/2011 1204   AST 14 09/26/2012 0912   AST 21 05/20/2011 1204   ALT 14 09/26/2012 0912   ALT 29 05/20/2011 1204   BILITOT 0.37 09/26/2012 0912   BILITOT 0.3 05/20/2011 1204    ADDITIONAL INFORMATION: 4. CHROMOGENIC IN-SITU HYBRIDIZATION (BLOCK 4C) Interpretation HER-2/NEU BY CISH - NO AMPLIFICATION OF HER-2 DETECTED. THE RATIO OF HER-2: CEP 17 SIGNALS WAS 1.36. Reference range: Ratio: HER2:CEP17 < 1.8 - gene amplification not observed Ratio: HER2:CEP 17 1.8-2.2 - equivocal result Ratio: HER2:CEP17 > 2.2 - gene amplification observed CHROMOGENIC IN-SITU HYBRIDIZATION (BLOCK 4E) Interpretation HER-2/NEU BY CISH - NO AMPLIFICATION OF HER-2 DETECTED. THE RATIO OF HER-2: CEP 17 SIGNALS WAS 0.90. Reference range: Ratio: HER2:CEP17 < 1.8 - gene amplification not observed Ratio: HER2:CEP 17 1.8-2.2 - equivocal result Ratio: HER2:CEP17 > 2.2 - gene amplification observed Pecola Leisure MD Pathologist, Electronic Signature ( Signed 05/28/2012) FINAL DIAGNOSIS Diagnosis 1. Lymph node, sentinel, biopsy, Left axilla #1 - ONE LYMPH NODE, NEGATIVE FOR TUMOR (0/1). 1 of 5 FINAL for Mary, BERT [name] [phone]) Diagnosis(continued) - SEE COMMENT. 2. Lymph node, sentinel, biopsy, Left axilla #2 - ONE LYMPH NODE, NEGATIVE FOR TUMOR (0/1). - SEE COMMENT. 3. Breast, simple mastectomy, Left - DUCTAL CARCINOMA IN SITU, SEE COMMENT. - IN SITU CARCINOMA IS 1.5 CM FROM THE NEAREST MARGIN (DEEP). - ONE LYMPH NODE, NEGATIVE FOR TUMOR (0/1). - SEE TUMOR SYNOPTIC TEMPLATE BELOW. 4. Breast, simple mastectomy, Right, with axillary contents - INVASIVE LOBULAR CARCINOMA (2.3 CM), SEE COMMENT. - INVASIVE TUMOR IS 2.8 CM FROM THE NEAREST MARGIN (DEEP). - BENIGN NIPPLE, NEGATIVE FOR TUMOR. - LOBULAR CARCINOMA IN SITU. - EIGHT LYMPH NODES, POSITIVE FOR METASTATIC MAMMARY CARCINOMA (8/17). - NO EXTRACAPSULAR TUMOR EXTENSION IDENTIFIED. - SEE  TUMOR SYNOPTIC  TEMPLATE BELOW. 5. Lymph nodes, regional resection, Additional Right Axillary - ONE LYMPH NODE, POSITIVE FOR ISOLATED TUMOR CELLS (0/1) SEE COMMENT Microscopic Comment 1. and 2. There are no intranodal metastatic tumor deposits identified with cytokeratin AE1/AE3 immunostain. 3. BREAST, IN SITU CARCINOMA Specimen, including laterality: Left breast. Procedure: Simple mastectomy. Grade of carcinoma: II (grading limited by neoadjuvant-related change). Necrosis: Absent. Estimated tumor size: (glass slide measurement): 0.3 cm. Treatment effect: Present. If present, treatment effect in breast tissue, lymph nodes or both: Breast tissue. Distance to closest margin: 1.5 cm. If margin positive, focally or broadly: N/[name]. Breast prognostic profile: Estrogen and progesterone receptor: Not repeated; previous study demonstrated 81% for estrogen and 74% for progesterone ([id number]). Lymph nodes: Examined: 3 (Parts 1, 2 and 3). Lymph nodes with metastasis: 0. TNM: ypTis, pN0. 4. BREAST, INVASIVE TUMOR, WITH LYMPH NODE SAMPLING Specimen, including laterality: Right breast. Procedure: Simple mastectomy. Grade: II of III. Tubule formation: 3. Nuclear pleomorphism: 2. Mitotic: 1. Tumor size (gross measurement): 2.3 cm. Margins: Invasive, distance to closest margin: 1.5 cm. In situ, distance to closest margin: 1.5 cm (deep). If margin positive, focally or broadly: None. Lymphovascular invasion: Absent. 2 of 5 FINAL for Mary, MCCLUSKEY [name] [phone]) Microscopic Comment(continued) Ductal carcinoma in situ: None. Grade: N/[name]. Extensive intraductal component: N/[name]. Lobular neoplasia: Present, in situ carcinoma. Tumor focality: Unifocal. Treatment effect: Present. If present, treatment effect in breast tissue, lymph nodes or both: Both Extent of tumor: Skin: Negative. Nipple: Negative. Skeletal muscle: Negative. Lymph nodes: # examined: 18 (Parts 4 and 5). Lymph nodes with metastasis:  8 Isolated tumor cells (< 0.2 mm): 5 see comment Micrometastasis: (> 0.2 mm and < 2.0 mm): 0. Macrometastasis: (> 2.0 mm): 0.2 cm, 0.8 cm, 0.3 cm, 0.3 cm, 0.3 cm, 0.9 cm, and 1.3 cm. Extracapsular extension: Absent. Breast prognostic profile: Estrogen and Progesterone receptor: Not repeated; previous study demonstrated 95% for estrogen and 53 % for progesterone ([id number]). Ki-67: Not repeated; previous study demonstrated 85% proliferation rate ([id number]). HER-2/neu: Repeated; previous study demonstrated no amplification (1.21) ([id number]). Non-neoplastic breast: Multiple complex sclerosing lesions/radial scars, fibroadenomatoid nodules, fibrocystic change, and microcalcifications in benign ducts and lobules. TNM: ypT2, pN2a, pMX. (CRR:eps 05/21/12) Comment: There are isolated tumor cells present in [name] total of five lymph nodes from part 4 (slides 4K, 4R, and 78M) and part 5 (Slide 5A). The isolated tumor cells were identified with cytokeratin AE1/3 immunostain. 5. The isolated tumor cells were identified with cytokeratin AE1/3 immunostain.    RADIOGRAPHIC STUDIES:  ASSESSMENT: 52 year old female with  #1 T3, N1, 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 Patient is now going to receive neoadjuvant chemotherapy consisting of FEC 100. She will receive dose dense FEC with day 2 Neulasta. She is overall doing well she understands the rationale for neoadjuvant treatment she understands the side effects of her chemotherapy. She also has her antiemetics already at her disposal to take as directed.  #2 patient has now completed 4 cycles of neoadjuvant FEC 100.  #3 she received single agent Taxol weekly from  02/02/2012 hrough 04/20/2012..    #4 patient is status post bilateral mastectomies performed on 05/18/2012. She had [name] right axillary lymph node dissection and left sentinel lymph node biopsy. Her port was removed also. Her final pathology on the right  revealed invasive  Lobular carcinoma measuring 2.3 cm 8 of 17 lymph nodes were positive (T2 N2A). 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 She tolerated it well.  #6 patient is on tamoxifen 20 mg daily since April 2014. She has also been receiving Zoladex injections every month. FSH was obtained and she is still premenopausal. At this time we'll continue the tamoxifen as well as the Zoladex injections. I will plan on checking her FSH levels again in 6 months time.   PLAN:  #1 begin tamoxifen 20 mg daily  #2 she will continue Zoladex injections on [name] monthly basis. Eventually I will plan on obtaining hormone levels on her to see if she could be switched to an aromatase inhibitor such as letrozole.  #3 patient will return in 6 months time for followup.  All questions were answered. The patient knows to call the clinic with any problems, questions or concerns. We can certainly see the patient much sooner if necessary.  I spent 25 minutes counseling the patient face to face. The total time spent in the appointment was 30 minutes.  Drue Second, MD Medical/Oncology [facility] [phone] (beeper) [phone] (Office)

## 2012-09-26 NOTE — Patient Instructions (Addendum)
Continue tamoxifen 20 mg daily  We will check your hormone levels with next visit in 6 months to see if we can witch you to an aromatase inhibitor such as aromasin  I will see you back in 6 months

## 2012-09-27 ENCOUNTER — Other Ambulatory Visit: Payer: 59 | Admitting: Lab

## 2012-10-03 ENCOUNTER — Other Ambulatory Visit: Payer: Self-pay | Admitting: *Deleted

## 2012-10-03 NOTE — Telephone Encounter (Signed)
Forwarded request for Ativan to MD in basket for approval-also on Xanax 0.25 mg bid per PCP for anxiety.

## 2012-10-04 ENCOUNTER — Other Ambulatory Visit: Payer: Self-pay | Admitting: Emergency Medicine

## 2012-10-04 MED ORDER — LORAZEPAM 0.5 MG PO TABS: 0.5000 mg | ORAL_TABLET | Freq: Three times a day (TID) | ORAL | Status: DC

## 2012-10-22 ENCOUNTER — Ambulatory Visit (HOSPITAL_BASED_OUTPATIENT_CLINIC_OR_DEPARTMENT_OTHER): Payer: 59

## 2012-10-22 VITALS — BP 151/81 | HR 87 | Temp 98.1°F

## 2012-10-22 DIAGNOSIS — C50411 Malignant neoplasm of upper-outer quadrant of right female breast: Secondary | ICD-10-CM

## 2012-10-22 DIAGNOSIS — C50912 Malignant neoplasm of unspecified site of left female breast: Secondary | ICD-10-CM

## 2012-10-22 DIAGNOSIS — Z5111 Encounter for antineoplastic chemotherapy: Secondary | ICD-10-CM

## 2012-10-22 DIAGNOSIS — C50419 Malignant neoplasm of upper-outer quadrant of unspecified female breast: Secondary | ICD-10-CM

## 2012-10-22 MED ORDER — GOSERELIN ACETATE 3.6 MG ~~LOC~~ IMPL
3.6000 mg | DRUG_IMPLANT | Freq: Once | SUBCUTANEOUS | Status: AC
  Administered 2012-10-22: 3.6 mg via SUBCUTANEOUS
  Filled 2012-10-22: qty 3.6

## 2012-10-22 NOTE — Patient Instructions (Addendum)
Goserelin injection What is this medicine? GOSERELIN (GOE se rel in) is similar to a hormone found in the body. It lowers the amount of sex hormones that the body makes. Men will have lower testosterone levels and women will have lower estrogen levels while taking this medicine. In men, this medicine is used to treat prostate cancer; the injection is either given once per month or once every 12 weeks. A once per month injection (only) is used to treat women with endometriosis, dysfunctional uterine bleeding, or advanced breast cancer. This medicine may be used for other purposes; ask your health care provider or pharmacist if you have questions. What should I tell my health care provider before I take this medicine? They need to know if you have any of these conditions (some only apply to women): -diabetes -heart disease or previous heart attack -high blood pressure -high cholesterol -kidney disease -osteoporosis or low bone density -problems passing urine -spinal cord injury -stroke -tobacco smoker -an unusual or allergic reaction to goserelin, hormone therapy, other medicines, foods, dyes, or preservatives -pregnant or trying to get pregnant -breast-feeding How should I use this medicine? This medicine is for injection under the skin. It is given by a health care professional in a hospital or clinic setting. Men receive this injection once every 4 weeks or once every 12 weeks. Women will only receive the once every 4 weeks injection. Talk to your pediatrician regarding the use of this medicine in children. Special care may be needed. Overdosage: If you think you have taken too much of this medicine contact a poison control center or emergency room at once. NOTE: This medicine is only for you. Do not share this medicine with others. What if I miss a dose? It is important not to miss your dose. Call your doctor or health care professional if you are unable to keep an appointment. What may  interact with this medicine? -female hormones like estrogen -herbal or dietary supplements like black cohosh, chasteberry, or DHEA -female hormones like testosterone -prasterone This list may not describe all possible interactions. Give your health care provider a list of all the medicines, herbs, non-prescription drugs, or dietary supplements you use. Also tell them if you smoke, drink alcohol, or use illegal drugs. Some items may interact with your medicine. What should I watch for while using this medicine? Visit your doctor or health care professional for regular checks on your progress. Your symptoms may appear to get worse during the first weeks of this therapy. Tell your doctor or healthcare professional if your symptoms do not start to get better or if they get worse after this time. Your bones may get weaker if you take this medicine for a long time. If you smoke or frequently drink alcohol you may increase your risk of bone loss. A family history of osteoporosis, chronic use of drugs for seizures (convulsions), or corticosteroids can also increase your risk of bone loss. Talk to your doctor about how to keep your bones strong. This medicine should stop regular monthly menstration in women. Tell your doctor if you continue to menstrate. Women should not become pregnant while taking this medicine or for 12 weeks after stopping this medicine. Women should inform their doctor if they wish to become pregnant or think they might be pregnant. There is a potential for serious side effects to an unborn child. Talk to your health care professional or pharmacist for more information. Do not breast-feed an infant while taking this medicine. Men should   inform their doctors if they wish to father a child. This medicine may lower sperm counts. Talk to your health care professional or pharmacist for more information. What side effects may I notice from receiving this medicine? Side effects that you should  report to your doctor or health care professional as soon as possible: -allergic reactions like skin rash, itching or hives, swelling of the face, lips, or tongue -bone pain -breathing problems -changes in vision -chest pain -feeling faint or lightheaded, falls -fever, chills -pain, swelling, warmth in the leg -pain, tingling, numbness in the hands or feet -swelling of the ankles, feet, hands -trouble passing urine or change in the amount of urine -unusually high or low blood pressure -unusually weak or tired Side effects that usually do not require medical attention (report to your doctor or health care professional if they continue or are bothersome): -change in sex drive or performance -changes in breast size in both males and females -changes in emotions or moods -headache -hot flashes -irritation at site where injected -loss of appetite -skin problems like acne, dry skin -vaginal dryness This list may not describe all possible side effects. Call your doctor for medical advice about side effects. You may report side effects to FDA at 1-800-FDA-1088. Where should I keep my medicine? This drug is given in a hospital or clinic and will not be stored at home. NOTE: This sheet is a summary. It may not cover all possible information. If you have questions about this medicine, talk to your doctor, pharmacist, or health care provider.  2013, [employer]. (07/22/2008 1:28:29 PM)  

## 2012-11-05 ENCOUNTER — Other Ambulatory Visit: Payer: Self-pay | Admitting: *Deleted

## 2012-11-05 DIAGNOSIS — C50411 Malignant neoplasm of upper-outer quadrant of right female breast: Secondary | ICD-10-CM

## 2012-11-05 MED ORDER — LORAZEPAM 0.5 MG PO TABS: 0.5000 mg | ORAL_TABLET | Freq: Three times a day (TID) | ORAL | Status: DC

## 2012-11-19 ENCOUNTER — Ambulatory Visit: Payer: 59

## 2012-11-20 ENCOUNTER — Ambulatory Visit (HOSPITAL_BASED_OUTPATIENT_CLINIC_OR_DEPARTMENT_OTHER): Payer: 59

## 2012-11-20 VITALS — BP 153/84 | HR 91 | Temp 98.4°F

## 2012-11-20 DIAGNOSIS — Z5111 Encounter for antineoplastic chemotherapy: Secondary | ICD-10-CM

## 2012-11-20 DIAGNOSIS — C50419 Malignant neoplasm of upper-outer quadrant of unspecified female breast: Secondary | ICD-10-CM

## 2012-11-20 DIAGNOSIS — C50411 Malignant neoplasm of upper-outer quadrant of right female breast: Secondary | ICD-10-CM

## 2012-11-20 DIAGNOSIS — C50912 Malignant neoplasm of unspecified site of left female breast: Secondary | ICD-10-CM

## 2012-11-20 MED ORDER — GOSERELIN ACETATE 3.6 MG ~~LOC~~ IMPL
3.6000 mg | DRUG_IMPLANT | Freq: Once | SUBCUTANEOUS | Status: AC
  Administered 2012-11-20: 3.6 mg via SUBCUTANEOUS
  Filled 2012-11-20: qty 3.6

## 2012-11-27 ENCOUNTER — Ambulatory Visit (HOSPITAL_BASED_OUTPATIENT_CLINIC_OR_DEPARTMENT_OTHER): Payer: 59 | Admitting: Adult Health

## 2012-11-27 ENCOUNTER — Encounter: Payer: Self-pay | Admitting: Adult Health

## 2012-11-27 ENCOUNTER — Other Ambulatory Visit: Payer: Self-pay | Admitting: Emergency Medicine

## 2012-11-27 ENCOUNTER — Other Ambulatory Visit (HOSPITAL_BASED_OUTPATIENT_CLINIC_OR_DEPARTMENT_OTHER): Payer: 59 | Admitting: Lab

## 2012-11-27 VITALS — BP 144/84 | HR 80 | Temp 98.3°F | Resp 20 | Ht 64.0 in | Wt 160.2 lb

## 2012-11-27 DIAGNOSIS — C773 Secondary and unspecified malignant neoplasm of axilla and upper limb lymph nodes: Secondary | ICD-10-CM

## 2012-11-27 DIAGNOSIS — C50419 Malignant neoplasm of upper-outer quadrant of unspecified female breast: Secondary | ICD-10-CM

## 2012-11-27 DIAGNOSIS — B029 Zoster without complications: Secondary | ICD-10-CM

## 2012-11-27 LAB — CBC WITH DIFFERENTIAL/PLATELET
Basophils Absolute: 0.1 10*3/uL (ref 0.0–0.1)
Eosinophils Absolute: 0.1 10*3/uL (ref 0.0–0.5)
HGB: 13.3 g/dL (ref 11.6–15.9)
LYMPH%: 12.7 % — ABNORMAL LOW (ref 14.0–49.7)
MCV: 95.1 fL (ref 79.5–101.0)
MONO%: 10 % (ref 0.0–14.0)
NEUT#: 4.3 10*3/uL (ref 1.5–6.5)
Platelets: 205 10*3/uL (ref 145–400)
RDW: 12.3 % (ref 11.2–14.5)

## 2012-11-27 LAB — COMPREHENSIVE METABOLIC PANEL (CC13)
Albumin: 3.4 g/dL — ABNORMAL LOW (ref 3.5–5.0)
Alkaline Phosphatase: 70 U/L (ref 40–150)
BUN: 12.7 mg/dL (ref 7.0–26.0)
Glucose: 115 mg/dl (ref 70–140)
Potassium: 4 mEq/L (ref 3.5–5.1)

## 2012-11-27 MED ORDER — ACYCLOVIR 400 MG PO TABS: 400.0000 mg | ORAL_TABLET | Freq: Every day | ORAL | Status: DC

## 2012-11-27 NOTE — Progress Notes (Signed)
OFFICE PROGRESS NOTE  CC  TULLO,TERESA, MD [address] [place] [address] Dr. Chipper Herb Dr. Jane Canary  DIAGNOSIS: 52 year old female with diagnosis of stage IIIA T3, N1, M0 invasive lobular carcinoma, Of the right breast with DCIS of the the left breast. Essentially patient has bilateral breast cancers.  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   #2 patient has begun FEC 100 starting on 12/08/2011 - 01/18/12. A total of 4 cycles of this chemotherapy is planned and then thereafter she will proceed with Taxol as a single agent for total of 12 weeks.  #3 patient is status post single agent Taxol x12 weeks from 02/02/2012 through January 2014.  #4patient is status post bilateral mastectomies performed on 05/18/2012. She had a right axillary lymph node dissection and left sentinel lymph node biopsy. Her port was removed also. Her final pathology on the right revealed invasive  Lobular carcinoma measuring 2.3 cm 8 of 17 lymph nodes were positive (T2 N2A). 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  5 completed her radiation therapy she overall tolerated very well. Her treatments were administered from 07/04/2012 through 08/17/2012.  #6 patient began tamoxifen 20 mg daily in June 2014. Overall she is tolerating it well. We will continue this for now  #7 patient is on Zoladex injections q. monthly and we are checking her hormone levels periodically  CURRENT THERAPY tamoxifen 20 mg daily  INTERVAL HISTORY: [name] 52 y.o. female returns for follow-up visit today.  She has pain and itching and lesions that have developed on the left side of her face, beginning near  her chin and extending to her left ear.  There are none on the left side.  Otherwise, she denies any change in  vision, eye pain, difficulty hearing, ear pain, tinnitus, or further problems.  Otherwise, a 10 point ROS is neg.   MEDICAL HISTORY: Past Medical History  Diagnosis Date  . Irritable bowel syndrome   . Dental crowns present     x 4  . Neutropenia, drug-induced 12/15/2011  . Breast cancer August 2013    bilateral, er/pr+  . Complication of anesthesia     pt states she had the shakes extremely bad  . Cough     at night d/t reflux;nothing productive  . History of bronchitis     08/2011  . GERD (gastroesophageal reflux disease)     takes Nexium daily  . IBS (irritable bowel syndrome)     immodium prn  . History of colon polyps   . Urinary frequency   . Nocturia   . Insomnia     takes Ambien nightly  . Anxiety     takes Xanax prn  . Plantar fasciitis   . Hot flashes, menopausal 08/08/2012  . Status post chemotherapy     FEC 100 starting on 12/08/2011 - 01/18/12/single agent Taxol x12 weeks from 02/02/2012 through January 2014   . S/P radiation therapy 07/03/2012 through 08/18/2038                                                         Right chest wall/regional lymph nodes  5040 cGy 28 sessions, right chest wall/mastectomy scar boost 1000 cGy 5 sessions                          . Use of tamoxifen (Nolvadex)     ALLERGIES:  is allergic to adhesive.  MEDICATIONS:  Current Outpatient Prescriptions  Medication Sig Dispense Refill  . ALPRAZolam (XANAX) 0.25 MG tablet TAKE ONE TABLET BY MOUTH TWICE DAILY AS NEEDED FOR ANXIETY  60 tablet  4  . Ascorbic Acid (VITAMIN C) 100 MG tablet Take 100 mg by mouth daily.        Marland Kitchen b complex vitamins tablet Take 1 tablet by mouth daily.       . Calcium Carbonate-Vitamin D (CALTRATE 600+D PO) Take 1 tablet by mouth daily.      . cholecalciferol (VITAMIN D) 400 UNITS TABS Take 400 Units by mouth daily.       Marland Kitchen esomeprazole (NEXIUM) 40 MG capsule Take 40 mg by mouth daily before breakfast.      . loperamide (IMODIUM A-D) 2 MG tablet Take 2 mg by  mouth 4 (four) times daily as needed for diarrhea or loose stools.       Marland Kitchen LORazepam (ATIVAN) 0.5 MG tablet Take 1 tablet (0.5 mg total) by mouth every 8 (eight) hours.  60 tablet  0  . Multiple Vitamin (MULTIVITAMIN) tablet Take 1 tablet by mouth daily.        . tamoxifen (NOLVADEX) 20 MG tablet Take 1 tablet (20 mg total) by mouth daily.  30 tablet  2  . venlafaxine XR (EFFEXOR-XR) 37.5 MG 24 hr capsule Take 1 capsule (37.5 mg total) by mouth daily.  30 capsule  6  . vitamin B-12 (CYANOCOBALAMIN) 100 MCG tablet Take 100 mcg by mouth daily.        No current facility-administered medications for this visit.    SURGICAL HISTORY:  Past Surgical History  Procedure Laterality Date  . Portacath placement  12/02/2011    Procedure: INSERTION PORT-A-CATH;  Surgeon: Ernestene Mention, MD;  Location:  [facility];  Service: General;  Laterality: N/A;  insertion port a cath with fluoroscopy  . Breast lumpectomy  2008    left  . Colonoscopy    . Mastectomy with axillary lymph node dissection Right 05/18/2012    Procedure: MASTECTOMY WITH AXILLARY LYMPH NODE DISSECTION;  Surgeon: Ernestene Mention, MD;  Location: [facility] OR;  Service: General;  Laterality: Right;  Bilateral Total Mastectomy , right axillary lymph node dissection left axillary sentinel node biopsy, removal of Porta Cath  . Simple mastectomy with axillary sentinel node biopsy Left 05/18/2012    Procedure: SIMPLE MASTECTOMY WITH AXILLARY SENTINEL NODE BIOPSY;  Surgeon: Ernestene Mention, MD;  Location: [facility] OR;  Service: General;  Laterality: Left;  . Port-a-cath removal N/A 05/18/2012    Procedure: REMOVAL PORT-A-CATH;  Surgeon: Ernestene Mention, MD;  Location: [facility] OR;  Service: General;  Laterality: N/A;    REVIEW OF SYSTEMS:   General: fatigue (+), night sweats (-), fever (-), pain (-) Lymph: palpable nodes (-) HEENT: vision changes (-), mucositis (-), gum bleeding (-), epistaxis (-) Cardiovascular: chest pain (-), palpitations  (-) Pulmonary: shortness of breath (-), dyspnea on exertion (-), cough (-), hemoptysis (-) GI:  Early satiety (-), melena (-), dysphagia (-), nausea/vomiting (-), diarrhea (-) GU: dysuria (-), hematuria (-), incontinence (-) Musculoskeletal: joint swelling (-), joint pain (-), back pain (-) Neuro: weakness (-), numbness (-),  headache (-), confusion (-) Skin: Rash (-), lesions (-), dryness (-) Psych: depression (-), suicidal/homicidal ideation (-), feeling of hopelessness (-)   PHYSICAL EXAMINATION:  BP 144/84  Pulse 80  Temp(Src) 98.3 F (36.8 C) (Oral)  Resp 20  Ht 5\' 4"  (1.626 m)  Wt 160 lb 3.2 oz (72.666 kg)  BMI 27.48 kg/m2 General: Patient is a well appearing female in no acute distress HEENT: PERRLA, sclerae anicteric no conjunctival pallor, MMM Neck: supple, no palpable adenopathy Lungs: clear to auscultation bilaterally, no wheezes, rhonchi, or rales Cardiovascular: regular rate rhythm, S1, S2, no murmurs, rubs or gallops Abdomen: Soft, non-tender, non-distended, normoactive bowel sounds, no HSM Extremities: warm and well perfused, no clubbing, cyanosis, or edema Skin: vesicular lesions on left mandible and along ear, left TM opaque, BLMs visualized.  Neuro: Non-focal Breast:status post bilateral mastectomies with JP drains in place.  ECOG PERFORMANCE STATUS: 0 - Asymptomatic   LABORATORY DATA: Lab Results  Component Value Date   WBC 5.8 11/27/2012   HGB 13.3 11/27/2012   HCT 39.4 11/27/2012   MCV 95.1 11/27/2012   PLT 205 11/27/2012      Chemistry      Component Value Date/Time   NA 142 09/26/2012 0912   NA 141 05/19/2012 0710   K 4.0 09/26/2012 0912   K 4.1 05/19/2012 0710   CL 106 08/20/2012 0802   CL 107 05/19/2012 0710   CO2 30* 09/26/2012 0912   CO2 25 05/19/2012 0710   BUN 16.9 09/26/2012 0912   BUN 9 05/19/2012 0710   CREATININE 0.8 09/26/2012 0912   CREATININE 0.77 05/19/2012 0710   CREATININE 0.82 05/20/2011 1204      Component Value Date/Time   CALCIUM 9.6 09/26/2012 0912    CALCIUM 8.7 05/19/2012 0710   ALKPHOS 71 09/26/2012 0912   ALKPHOS 80 05/20/2011 1204   AST 14 09/26/2012 0912   AST 21 05/20/2011 1204   ALT 14 09/26/2012 0912   ALT 29 05/20/2011 1204   BILITOT 0.37 09/26/2012 0912   BILITOT 0.3 05/20/2011 1204    ADDITIONAL INFORMATION: 4. CHROMOGENIC IN-SITU HYBRIDIZATION (BLOCK 4C) Interpretation HER-2/NEU BY CISH - NO AMPLIFICATION OF HER-2 DETECTED. THE RATIO OF HER-2: CEP 17 SIGNALS WAS 1.36. Reference range: Ratio: HER2:CEP17 < 1.8 - gene amplification not observed Ratio: HER2:CEP 17 1.8-2.2 - equivocal result Ratio: HER2:CEP17 > 2.2 - gene amplification observed CHROMOGENIC IN-SITU HYBRIDIZATION (BLOCK 4E) Interpretation HER-2/NEU BY CISH - NO AMPLIFICATION OF HER-2 DETECTED. THE RATIO OF HER-2: CEP 17 SIGNALS WAS 0.90. Reference range: Ratio: HER2:CEP17 < 1.8 - gene amplification not observed Ratio: HER2:CEP 17 1.8-2.2 - equivocal result Ratio: HER2:CEP17 > 2.2 - gene amplification observed Pecola Leisure MD Pathologist, Electronic Signature ( Signed 05/28/2012) FINAL DIAGNOSIS Diagnosis 1. Lymph node, sentinel, biopsy, Left axilla #1 - ONE LYMPH NODE, NEGATIVE FOR TUMOR (0/1). 1 of 5 FINAL for SREYA, FROIO A [phone]) Diagnosis(continued) - SEE COMMENT. 2. Lymph node, sentinel, biopsy, Left axilla #2 - ONE LYMPH NODE, NEGATIVE FOR TUMOR (0/1). - SEE COMMENT. 3. Breast, simple mastectomy, Left - DUCTAL CARCINOMA IN SITU, SEE COMMENT. - IN SITU CARCINOMA IS 1.5 CM FROM THE NEAREST MARGIN (DEEP). - ONE LYMPH NODE, NEGATIVE FOR TUMOR (0/1). - SEE TUMOR SYNOPTIC TEMPLATE BELOW. 4. Breast, simple mastectomy, Right, with axillary contents - INVASIVE LOBULAR CARCINOMA (2.3 CM), SEE COMMENT. - INVASIVE TUMOR IS 2.8 CM FROM THE NEAREST MARGIN (DEEP). - BENIGN NIPPLE, NEGATIVE FOR TUMOR. - LOBULAR CARCINOMA IN SITU. - EIGHT LYMPH NODES, POSITIVE FOR  METASTATIC MAMMARY CARCINOMA (8/17). - NO EXTRACAPSULAR TUMOR EXTENSION IDENTIFIED. - SEE TUMOR  SYNOPTIC TEMPLATE BELOW. 5. Lymph nodes, regional resection, Additional Right Axillary - ONE LYMPH NODE, POSITIVE FOR ISOLATED TUMOR CELLS (0/1) SEE COMMENT Microscopic Comment 1. and 2. There are no intranodal metastatic tumor deposits identified with cytokeratin AE1/AE3 immunostain. 3. BREAST, IN SITU CARCINOMA Specimen, including laterality: Left breast. Procedure: Simple mastectomy. Grade of carcinoma: II (grading limited by neoadjuvant-related change). Necrosis: Absent. Estimated tumor size: (glass slide measurement): 0.3 cm. Treatment effect: Present. If present, treatment effect in breast tissue, lymph nodes or both: Breast tissue. Distance to closest margin: 1.5 cm. If margin positive, focally or broadly: N/A. Breast prognostic profile: Estrogen and progesterone receptor: Not repeated; previous study demonstrated 81% for estrogen and 74% for progesterone ([id number]). Lymph nodes: Examined: 3 (Parts 1, 2 and 3). Lymph nodes with metastasis: 0. TNM: ypTis, pN0. 4. BREAST, INVASIVE TUMOR, WITH LYMPH NODE SAMPLING Specimen, including laterality: Right breast. Procedure: Simple mastectomy. Grade: II of III. Tubule formation: 3. Nuclear pleomorphism: 2. Mitotic: 1. Tumor size (gross measurement): 2.3 cm. Margins: Invasive, distance to closest margin: 1.5 cm. In situ, distance to closest margin: 1.5 cm (deep). If margin positive, focally or broadly: None. Lymphovascular invasion: Absent. 2 of 5 FINAL for JACIE, TRISTAN A [phone]) Microscopic Comment(continued) Ductal carcinoma in situ: None. Grade: N/A. Extensive intraductal component: N/A. Lobular neoplasia: Present, in situ carcinoma. Tumor focality: Unifocal. Treatment effect: Present. If present, treatment effect in breast tissue, lymph nodes or both: Both Extent of tumor: Skin: Negative. Nipple: Negative. Skeletal muscle: Negative. Lymph nodes: # examined: 18 (Parts 4 and 5). Lymph nodes with metastasis:  8 Isolated tumor cells (< 0.2 mm): 5 see comment Micrometastasis: (> 0.2 mm and < 2.0 mm): 0. Macrometastasis: (> 2.0 mm): 0.2 cm, 0.8 cm, 0.3 cm, 0.3 cm, 0.3 cm, 0.9 cm, and 1.3 cm. Extracapsular extension: Absent. Breast prognostic profile: Estrogen and Progesterone receptor: Not repeated; previous study demonstrated 95% for estrogen and 53 % for progesterone ([id number]). Ki-67: Not repeated; previous study demonstrated 85% proliferation rate ([id number]). HER-2/neu: Repeated; previous study demonstrated no amplification (1.21) ([id number]). Non-neoplastic breast: Multiple complex sclerosing lesions/radial scars, fibroadenomatoid nodules, fibrocystic change, and microcalcifications in benign ducts and lobules. TNM: ypT2, pN2a, pMX. (CRR:eps 05/21/12) Comment: There are isolated tumor cells present in a total of five lymph nodes from part 4 (slides 4K, 4R, and 21M) and part 5 (Slide 5A). The isolated tumor cells were identified with cytokeratin AE1/3 immunostain. 5. The isolated tumor cells were identified with cytokeratin AE1/3 immunostain.    RADIOGRAPHIC STUDIES:  ASSESSMENT: 52 year old female with  #1 T3, N1, 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 Patient is now going to receive neoadjuvant chemotherapy consisting of FEC 100. She will receive dose dense FEC with day 2 Neulasta. She is overall doing well she understands the rationale for neoadjuvant treatment she understands the side effects of her chemotherapy. She also has her antiemetics already at her disposal to take as directed.  #2 patient has now completed 4 cycles of neoadjuvant FEC 100.  #3 she received single agent Taxol weekly from  02/02/2012 hrough 04/20/2012.  #4 patient is status post bilateral mastectomies performed on 05/18/2012. She had a right axillary lymph node dissection and left sentinel lymph node biopsy. Her port was removed also. Her final pathology on the right  revealed invasive  Lobular carcinoma measuring 2.3 cm 8 of 17 lymph nodes were positive (T2 N2A). The left  breast showed ductal carcinoma in situ sentinel node was negative (Tis N0)  #5 patient has completed her radiation therapy from April 2014 through Aug 17, 2012. She tolerated it well.  #6 patient is on tamoxifen 20 mg daily since April 2014. She has also been receiving Zoladex injections every month. FSH was obtained and she is still premenopausal. At this time we'll continue the tamoxifen as well as the Zoladex injections. I will plan on checking her FSH levels again in 6 months time.   PLAN:  Patient likely has shingles along c2 dermatome.  I have begun acyclovir 800mg  5 times per day.  She will take this.  She will call me if she develops worsening nerve pain and I will prescribe Gabapentin.  She will return for her regularly scheduled breast cancer f/u with Dr. Welton Flakes in 01/2013.  All questions were answered. The patient knows to call the clinic with any problems, questions or concerns. We can certainly see the patient much sooner if necessary.  I spent 25 minutes counseling the patient face to face. The total time spent in the appointment was 30 minutes.  Cherie Ouch Lyn Hollingshead, NP Medical Oncology [facility] Phone: [phone]

## 2012-11-27 NOTE — Patient Instructions (Signed)
Acyclovir tablets or capsules What is this medicine? ACYCLOVIR (ay SYE kloe veer) is an antiviral medicine. It is used to treat or prevent infections caused by certain kinds of viruses. Examples of these infections include herpes and shingles. This medicine will not cure herpes. This medicine may be used for other purposes; ask your health care provider or pharmacist if you have questions. What should I tell my health care provider before I take this medicine? They need to know if you have any of these conditions: -kidney disease -an unusual or allergic reaction to acyclovir, ganciclovir, valacyclovir, other medicines, foods, dyes, or preservatives -pregnant or trying to get pregnant -breast-feeding How should I use this medicine? Take this medicine by mouth with a glass of water. Follow the directions on the prescription label. You can take it with or without food. Take your medicine at regular intervals. Do not take your medicine more often than directed. Take all of your medicine as directed even if you think your are better. Do not skip doses or stop your medicine early. Talk to your pediatrician regarding the use of this medicine in children. While this drug may be prescribed for selected conditions, precautions do apply. Overdosage: If you think you have taken too much of this medicine contact a poison control center or emergency room at once. NOTE: This medicine is only for you. Do not share this medicine with others. What if I miss a dose? If you miss a dose, take it as soon as you can. If it is almost time for your next dose, take only that dose. Do not take double or extra doses. What may interact with this medicine? -probenecid This list may not describe all possible interactions. Give your health care provider a list of all the medicines, herbs, non-prescription drugs, or dietary supplements you use. Also tell them if you smoke, drink alcohol, or use illegal drugs. Some items may  interact with your medicine. What should I watch for while using this medicine? Tell your doctor or health care professional if your symptoms do not improve. This medicine works best when started very early in the course of an infection. Begin treatment at the first signs of infection. Drink 6 to 8 glasses of water or fluids every day while you are taking this medicine. This will help prevent side effects. You can still pass chickenpox, shingles, or herpes to another person even while you are taking this medicine. Avoid contact with others as directed. Genital herpes is a sexually transmitted disease. Talk to your doctor about how to stop the spread of infection. What side effects may I notice from receiving this medicine? Side effects that you should report to your doctor or health care professional as soon as possible: -allergic reactions like skin rash, itching or hives, swelling of the face, lips, or tongue -chest pain -confusion, hallucinations, tremor -dark urine -increased sensitivity to the sun -redness, blistering, peeling or loosening of the skin, including inside the mouth -seizures -trouble passing urine or change in the amount of urine -unusual bleeding or bruising, or pinpoint red spots on the skin -unusually weak or tired -yellowing of the eyes or skin Side effects that usually do not require medical attention (report to your doctor or health care professional if they continue or are bothersome): -diarrhea -fever -headache -nausea, vomiting -stomach upset This list may not describe all possible side effects. Call your doctor for medical advice about side effects. You may report side effects to FDA at 1-800-FDA-1088. Where should   I keep my medicine? Keep out of the reach of children. Store at room temperature between 15 and 25 degrees C (59 and 77 degrees F). Throw away any unused medicine after the expiration date. NOTE: This sheet is a summary. It may not cover all  possible information. If you have questions about this medicine, talk to your doctor, pharmacist, or health care provider.  2012, [employer]. (05/23/2007 1:15:46 PM) 

## 2012-11-29 ENCOUNTER — Telehealth: Payer: Self-pay | Admitting: Infectious Disease

## 2012-11-29 MED ORDER — VALACYCLOVIR HCL 1 G PO TABS
1000.0000 mg | ORAL_TABLET | Freq: Three times a day (TID) | ORAL | Status: DC
Start: 2012-11-29 — End: 2013-02-11

## 2012-11-29 MED ORDER — TRAMADOL HCL 50 MG PO TABS: 50.0000 mg | ORAL_TABLET | Freq: Four times a day (QID) | ORAL | Status: DC | PRN

## 2012-11-29 NOTE — Telephone Encounter (Signed)
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 I would recommend going with more bio-available valtrex 1g tid for 2 weeks. I will also give her some ultram for pain give that it is currently not responding to current therapy and this certainly can be a disabling painful condition.

## 2012-11-30 ENCOUNTER — Other Ambulatory Visit: Payer: Self-pay | Admitting: Emergency Medicine

## 2012-11-30 MED ORDER — GABAPENTIN 300 MG PO CAPS: ORAL_CAPSULE | ORAL | Status: DC

## 2012-12-11 ENCOUNTER — Other Ambulatory Visit: Payer: Self-pay | Admitting: *Deleted

## 2012-12-11 DIAGNOSIS — C50411 Malignant neoplasm of upper-outer quadrant of right female breast: Secondary | ICD-10-CM

## 2012-12-11 MED ORDER — LORAZEPAM 0.5 MG PO TABS: 0.5000 mg | ORAL_TABLET | Freq: Three times a day (TID) | ORAL | Status: DC

## 2012-12-11 MED ORDER — TAMOXIFEN CITRATE 20 MG PO TABS: 20.0000 mg | ORAL_TABLET | Freq: Every day | ORAL | Status: DC

## 2012-12-17 ENCOUNTER — Ambulatory Visit (HOSPITAL_BASED_OUTPATIENT_CLINIC_OR_DEPARTMENT_OTHER): Payer: 59

## 2012-12-17 VITALS — BP 153/76 | HR 96 | Temp 98.4°F | Resp 20

## 2012-12-17 DIAGNOSIS — Z5111 Encounter for antineoplastic chemotherapy: Secondary | ICD-10-CM

## 2012-12-17 DIAGNOSIS — C50419 Malignant neoplasm of upper-outer quadrant of unspecified female breast: Secondary | ICD-10-CM

## 2012-12-17 DIAGNOSIS — C50411 Malignant neoplasm of upper-outer quadrant of right female breast: Secondary | ICD-10-CM

## 2012-12-17 DIAGNOSIS — C50912 Malignant neoplasm of unspecified site of left female breast: Secondary | ICD-10-CM

## 2012-12-17 MED ORDER — GOSERELIN ACETATE 3.6 MG ~~LOC~~ IMPL
3.6000 mg | DRUG_IMPLANT | Freq: Once | SUBCUTANEOUS | Status: AC
  Administered 2012-12-17: 3.6 mg via SUBCUTANEOUS
  Filled 2012-12-17: qty 3.6

## 2012-12-17 NOTE — Patient Instructions (Addendum)
Goserelin injection What is this medicine? GOSERELIN (GOE se rel in) is similar to a hormone found in the body. It lowers the amount of sex hormones that the body makes. Men will have lower testosterone levels and women will have lower estrogen levels while taking this medicine. In men, this medicine is used to treat prostate cancer; the injection is either given once per month or once every 12 weeks. A once per month injection (only) is used to treat women with endometriosis, dysfunctional uterine bleeding, or advanced breast cancer. This medicine may be used for other purposes; ask your health care provider or pharmacist if you have questions. What should I tell my health care provider before I take this medicine? They need to know if you have any of these conditions (some only apply to women): -diabetes -heart disease or previous heart attack -high blood pressure -high cholesterol -kidney disease -osteoporosis or low bone density -problems passing urine -spinal cord injury -stroke -tobacco smoker -an unusual or allergic reaction to goserelin, hormone therapy, other medicines, foods, dyes, or preservatives -pregnant or trying to get pregnant -breast-feeding How should I use this medicine? This medicine is for injection under the skin. It is given by a health care professional in a hospital or clinic setting. Men receive this injection once every 4 weeks or once every 12 weeks. Women will only receive the once every 4 weeks injection. Talk to your pediatrician regarding the use of this medicine in children. Special care may be needed. Overdosage: If you think you have taken too much of this medicine contact a poison control center or emergency room at once. NOTE: This medicine is only for you. Do not share this medicine with others. What if I miss a dose? It is important not to miss your dose. Call your doctor or health care professional if you are unable to keep an appointment. What may  interact with this medicine? -female hormones like estrogen -herbal or dietary supplements like black cohosh, chasteberry, or DHEA -female hormones like testosterone -prasterone This list may not describe all possible interactions. Give your health care provider a list of all the medicines, herbs, non-prescription drugs, or dietary supplements you use. Also tell them if you smoke, drink alcohol, or use illegal drugs. Some items may interact with your medicine. What should I watch for while using this medicine? Visit your doctor or health care professional for regular checks on your progress. Your symptoms may appear to get worse during the first weeks of this therapy. Tell your doctor or healthcare professional if your symptoms do not start to get better or if they get worse after this time. Your bones may get weaker if you take this medicine for a long time. If you smoke or frequently drink alcohol you may increase your risk of bone loss. A family history of osteoporosis, chronic use of drugs for seizures (convulsions), or corticosteroids can also increase your risk of bone loss. Talk to your doctor about how to keep your bones strong. This medicine should stop regular monthly menstration in women. Tell your doctor if you continue to menstrate. Women should not become pregnant while taking this medicine or for 12 weeks after stopping this medicine. Women should inform their doctor if they wish to become pregnant or think they might be pregnant. There is a potential for serious side effects to an unborn child. Talk to your health care professional or pharmacist for more information. Do not breast-feed an infant while taking this medicine. Men should   inform their doctors if they wish to father a child. This medicine may lower sperm counts. Talk to your health care professional or pharmacist for more information. What side effects may I notice from receiving this medicine? Side effects that you should  report to your doctor or health care professional as soon as possible: -allergic reactions like skin rash, itching or hives, swelling of the face, lips, or tongue -bone pain -breathing problems -changes in vision -chest pain -feeling faint or lightheaded, falls -fever, chills -pain, swelling, warmth in the leg -pain, tingling, numbness in the hands or feet -swelling of the ankles, feet, hands -trouble passing urine or change in the amount of urine -unusually high or low blood pressure -unusually weak or tired Side effects that usually do not require medical attention (report to your doctor or health care professional if they continue or are bothersome): -change in sex drive or performance -changes in breast size in both males and females -changes in emotions or moods -headache -hot flashes -irritation at site where injected -loss of appetite -skin problems like acne, dry skin -vaginal dryness This list may not describe all possible side effects. Call your doctor for medical advice about side effects. You may report side effects to FDA at 1-800-FDA-1088. Where should I keep my medicine? This drug is given in a hospital or clinic and will not be stored at home. NOTE: This sheet is a summary. It may not cover all possible information. If you have questions about this medicine, talk to your doctor, pharmacist, or health care provider.  2013, [employer]. (07/22/2008 1:28:29 PM)  

## 2012-12-26 ENCOUNTER — Telehealth: Payer: Self-pay | Admitting: Internal Medicine

## 2012-12-26 NOTE — Telephone Encounter (Signed)
Pt left vm.  States she received reminder letter to schedule breast imaging.  Pt had double mastectomy in 2/14 and would like [place] to remove future breast related reminders from her chart.

## 2012-12-27 NOTE — Telephone Encounter (Signed)
Letter not sent from here called [facility] and stopped automated mail.

## 2013-01-14 ENCOUNTER — Ambulatory Visit (HOSPITAL_BASED_OUTPATIENT_CLINIC_OR_DEPARTMENT_OTHER): Payer: 59

## 2013-01-14 VITALS — BP 163/79 | HR 88 | Temp 97.5°F | Resp 18

## 2013-01-14 DIAGNOSIS — Z5111 Encounter for antineoplastic chemotherapy: Secondary | ICD-10-CM

## 2013-01-14 DIAGNOSIS — C50419 Malignant neoplasm of upper-outer quadrant of unspecified female breast: Secondary | ICD-10-CM

## 2013-01-14 DIAGNOSIS — C50912 Malignant neoplasm of unspecified site of left female breast: Secondary | ICD-10-CM

## 2013-01-14 DIAGNOSIS — D0590 Unspecified type of carcinoma in situ of unspecified breast: Secondary | ICD-10-CM

## 2013-01-14 DIAGNOSIS — C50411 Malignant neoplasm of upper-outer quadrant of right female breast: Secondary | ICD-10-CM

## 2013-01-14 MED ORDER — GOSERELIN ACETATE 3.6 MG ~~LOC~~ IMPL
3.6000 mg | DRUG_IMPLANT | Freq: Once | SUBCUTANEOUS | Status: AC
  Administered 2013-01-14: 3.6 mg via SUBCUTANEOUS
  Filled 2013-01-14: qty 3.6

## 2013-01-24 ENCOUNTER — Other Ambulatory Visit: Payer: Self-pay

## 2013-02-11 ENCOUNTER — Ambulatory Visit (HOSPITAL_BASED_OUTPATIENT_CLINIC_OR_DEPARTMENT_OTHER): Payer: 59 | Admitting: Lab

## 2013-02-11 ENCOUNTER — Encounter: Payer: Self-pay | Admitting: Family

## 2013-02-11 ENCOUNTER — Ambulatory Visit (HOSPITAL_BASED_OUTPATIENT_CLINIC_OR_DEPARTMENT_OTHER): Payer: 59 | Admitting: Family

## 2013-02-11 ENCOUNTER — Ambulatory Visit: Payer: 59 | Admitting: Oncology

## 2013-02-11 ENCOUNTER — Telehealth: Payer: Self-pay | Admitting: *Deleted

## 2013-02-11 ENCOUNTER — Ambulatory Visit: Payer: 59

## 2013-02-11 VITALS — BP 153/58 | HR 85 | Temp 98.2°F | Resp 18 | Ht 64.0 in | Wt 161.6 lb

## 2013-02-11 DIAGNOSIS — C50411 Malignant neoplasm of upper-outer quadrant of right female breast: Secondary | ICD-10-CM

## 2013-02-11 DIAGNOSIS — Z170 Estrogen receptor positive status [ER+]: Secondary | ICD-10-CM

## 2013-02-11 DIAGNOSIS — Z5111 Encounter for antineoplastic chemotherapy: Secondary | ICD-10-CM

## 2013-02-11 DIAGNOSIS — C50419 Malignant neoplasm of upper-outer quadrant of unspecified female breast: Secondary | ICD-10-CM

## 2013-02-11 DIAGNOSIS — C773 Secondary and unspecified malignant neoplasm of axilla and upper limb lymph nodes: Secondary | ICD-10-CM

## 2013-02-11 DIAGNOSIS — Z853 Personal history of malignant neoplasm of breast: Secondary | ICD-10-CM

## 2013-02-11 DIAGNOSIS — D0590 Unspecified type of carcinoma in situ of unspecified breast: Secondary | ICD-10-CM

## 2013-02-11 DIAGNOSIS — R61 Generalized hyperhidrosis: Secondary | ICD-10-CM

## 2013-02-11 DIAGNOSIS — N951 Menopausal and female climacteric states: Secondary | ICD-10-CM

## 2013-02-11 DIAGNOSIS — C50912 Malignant neoplasm of unspecified site of left female breast: Secondary | ICD-10-CM

## 2013-02-11 LAB — CBC WITH DIFFERENTIAL/PLATELET
Basophils Absolute: 0 10*3/uL (ref 0.0–0.1)
EOS%: 2.2 % (ref 0.0–7.0)
Eosinophils Absolute: 0.1 10*3/uL (ref 0.0–0.5)
HCT: 41 % (ref 34.8–46.6)
HGB: 13.7 g/dL (ref 11.6–15.9)
LYMPH%: 16.3 % (ref 14.0–49.7)
MCH: 32.2 pg (ref 25.1–34.0)
MCHC: 33.5 g/dL (ref 31.5–36.0)
MCV: 96.2 fL (ref 79.5–101.0)
NEUT#: 4.4 10*3/uL (ref 1.5–6.5)
NEUT%: 74 % (ref 38.4–76.8)
RDW: 13.2 % (ref 11.2–14.5)
lymph#: 1 10*3/uL (ref 0.9–3.3)

## 2013-02-11 LAB — COMPREHENSIVE METABOLIC PANEL (CC13)
AST: 18 U/L (ref 5–34)
Albumin: 3.7 g/dL (ref 3.5–5.0)
Anion Gap: 10 mEq/L (ref 3–11)
BUN: 17.7 mg/dL (ref 7.0–26.0)
Calcium: 10.2 mg/dL (ref 8.4–10.4)
Chloride: 105 mEq/L (ref 98–109)
Creatinine: 0.8 mg/dL (ref 0.6–1.1)
Glucose: 111 mg/dl (ref 70–140)
Potassium: 3.9 mEq/L (ref 3.5–5.1)

## 2013-02-11 MED ORDER — GOSERELIN ACETATE 3.6 MG ~~LOC~~ IMPL
3.6000 mg | DRUG_IMPLANT | Freq: Once | SUBCUTANEOUS | Status: AC
  Administered 2013-02-11: 3.6 mg via SUBCUTANEOUS
  Filled 2013-02-11: qty 3.6

## 2013-02-11 NOTE — Progress Notes (Addendum)
[facility]  [phone] [phone]  OFFICE PROGRESS NOTE  ID: Mary [name]   DOB: 1960-11-03  MR#: [id number]  JYN#:[id number]   PCP: Duncan Dull, MD SU:  Claud Kelp, M.D. RAD ONC: Chipper Herb, M.D.   DIAGNOSIS: Mary [name] is a 52 y.o.  female with diagnosis of stage IIIA T2 N2 M0 invasive lobular carcinoma of the right breast and DCIS of the left breast in 10/2011   PRIOR THERAPY: #1  Seen in the multidisciplinary breast clinic in 11/2011 for new diagnosis of invasive lobular carcinoma of the right breast and DCIS of the left breast.   #2 Neoadjuvant chemotherapy consisting of FEC 100 (5-FU/Epirubicin/Cytoxan) received from 12/08/2011 through 01/19/2012 x 4 cycles.  This was followed by neoadjuvant Taxol as a single agent for total of 12 weeks from 02/02/2012 through 04/20/2012.  #3 Status post bilateral mastectomies performed on 05/18/2012 in which pathology showed:  (a) Left breast simple mastectomy with left axillary sentinel node biopsy for a stage 0, ypTis, pN0, 1.5 cm ductal carcinoma in situ, grade 2, estrogen receptor 81% positive, progesterone receptor 74% positive, with 0/3 of metastatic left axillary lymph nodes.  (b) Right breast simple mastectomy with right axillary regional lymph node resection for a stage IIIA, ypT2, pN2a, pMX, 2.3 cm invasive lobular carcinoma, grade 2, with lobular carcinoma in situ, estrogen receptor 95% positive, progesterone receptor 53% positive, Ki-67 at 85%, HER-2/neu by CISH no amplification, with 8/17 metastatic left axillary lymph nodes and 0/1 lymph node positive for isolated tumor cell.  #4 Radiation therapy from 07/04/2012 through 08/17/2012  #5 Began antiestrogen therapy with Tamoxifen 20 mg by mouth daily in 08/2012  #6 Started receiving monthly Zoladex injections on 06/04/2012 with hormone levels being checked periodically.   CURRENT THERAPY Tamoxifen 20 mg PO daily and monthly  Zoladex injections.   INTERVAL HISTORY: Mary [name] is a 52 y.o. female who returns for followup today regarding invasive lobular carcinoma of the right breast in DCIS of the left breast status post bilateral mastectomies.  Since her last office visit on 11/27/2012 she states her shingles along the C2 dermatome have resolved.  She has complaints of hot flashes and night sweats that she states are tolerable.  Her interval history is otherwise stable and unremarkable.   MEDICAL HISTORY: Past Medical History  Diagnosis Date  . Irritable bowel syndrome   . Dental crowns present     x 4  . Neutropenia, drug-induced 12/15/2011  . Breast cancer August 2013    bilateral, er/pr+  . Complication of anesthesia     pt states she had the shakes extremely bad  . Cough     at night d/t reflux;nothing productive  . History of bronchitis     08/2011  . GERD (gastroesophageal reflux disease)     takes Nexium daily  . IBS (irritable bowel syndrome)     immodium prn  . History of colon polyps   . Urinary frequency   . Nocturia   . Insomnia     takes Ambien nightly  . Anxiety     takes Xanax prn  . Plantar fasciitis   . Hot flashes, menopausal 08/08/2012  . Status post chemotherapy     FEC 100 starting on 12/08/2011 - 01/18/12/single agent Taxol x12 weeks from 02/02/2012 through January 2014   . S/P radiation therapy 07/03/2012 through 08/18/2038  Right chest wall/regional lymph nodes 5040 cGy 28 sessions, right chest wall/mastectomy scar boost 1000 cGy 5 sessions                          . Use of tamoxifen (Nolvadex)     ALLERGIES:   Allergies  Allergen Reactions  . Adhesive [Tape] Other (See Comments)    Redness:Please use paper tape     MEDICATIONS:  Current Outpatient Prescriptions  Medication Sig Dispense Refill  . ALPRAZolam (XANAX) 0.25 MG tablet TAKE ONE TABLET BY MOUTH TWICE DAILY AS NEEDED FOR ANXIETY  60 tablet  4    . Ascorbic Acid (VITAMIN C) 100 MG tablet Take 100 mg by mouth daily.        . Calcium Carbonate-Vitamin D (CALTRATE 600+D PO) Take 1 tablet by mouth daily.      . cholecalciferol (VITAMIN D) 400 UNITS TABS Take 400 Units by mouth daily.       Marland Kitchen esomeprazole (NEXIUM) 40 MG capsule Take 40 mg by mouth daily before breakfast.      . loperamide (IMODIUM A-D) 2 MG tablet Take 2 mg by mouth 4 (four) times daily as needed for diarrhea or loose stools.       Marland Kitchen LORazepam (ATIVAN) 0.5 MG tablet Take 1 tablet (0.5 mg total) by mouth every 8 (eight) hours.  60 tablet  2  . Multiple Vitamin (MULTIVITAMIN) tablet Take 1 tablet by mouth daily.        . tamoxifen (NOLVADEX) 20 MG tablet Take 1 tablet (20 mg total) by mouth daily.  30 tablet  3  . venlafaxine XR (EFFEXOR-XR) 37.5 MG 24 hr capsule Take 1 capsule (37.5 mg total) by mouth daily.  30 capsule  6  . b complex vitamins tablet Take 1 tablet by mouth daily.       Marland Kitchen gabapentin (NEURONTIN) 300 MG capsule Take 1 tablet 300mg  once on day 1, take one tablet 300mg  BID on day 2 and 3, then take 300mg  TID for shingles pain.  90 capsule  0  . traMADol (ULTRAM) 50 MG tablet Take 1 tablet (50 mg total) by mouth every 6 (six) hours as needed for pain.  60 tablet  1   No current facility-administered medications for this visit.    SURGICAL HISTORY:  Past Surgical History  Procedure Laterality Date  . Portacath placement  12/02/2011    Procedure: INSERTION PORT-A-CATH;  Surgeon: Ernestene Mention, MD;  Location: [facility];  Service: General;  Laterality: N/A;  insertion port a cath with fluoroscopy  . Breast lumpectomy  2008    left  . Colonoscopy    . Mastectomy with axillary lymph node dissection Right 05/18/2012    Procedure: MASTECTOMY WITH AXILLARY LYMPH NODE DISSECTION;  Surgeon: Ernestene Mention, MD;  Location: [facility] OR;  Service: General;  Laterality: Right;  Bilateral Total Mastectomy , right axillary lymph node dissection left axillary  sentinel node biopsy, removal of Porta Cath  . Simple mastectomy with axillary sentinel node biopsy Left 05/18/2012    Procedure: SIMPLE MASTECTOMY WITH AXILLARY SENTINEL NODE BIOPSY;  Surgeon: Ernestene Mention, MD;  Location: [facility] OR;  Service: General;  Laterality: Left;  . Port-a-cath removal N/A 05/18/2012    Procedure: REMOVAL PORT-A-CATH;  Surgeon: Ernestene Mention, MD;  Location: [facility] OR;  Service: General;  Laterality: N/A;    REVIEW OF SYSTEMS:   A 10 point review of systems was completed and  is negative except as noted above.  Mary [name] denies any other symptomatology including  fatigue, fever or chills, headache, vision changes, swollen glands, cough or shortness of breath, chest pain or discomfort, nausea, vomiting, diarrhea, constipation, change in urinary or bowel habits, arthralgias/myalgias, unusual bleeding/bruising or any other symptomatology.   PHYSICAL EXAMINATION:  BP 153/58  Pulse 85  Temp(Src) 98.2 F (36.8 C) (Oral)  Resp 18  Ht 5\' 4"  (1.626 m)  Wt 161 lb 9.6 oz (73.301 kg)  BMI 27.72 kg/m2  ECOG PERFORMANCE STATUS: 1 - Symptomatic but completely ambulatory  General appearance: Alert, cooperative, well nourished, no apparent distress Head: Normocephalic, without obvious abnormality, atraumatic Eyes: Conjunctivae/corneas clear, PERRLA, EOMI Nose: Nares, septum and mucosa are normal, no drainage or sinus tenderness Neck: No adenopathy, supple, symmetrical, trachea midline, no tenderness Resp: Clear to auscultation bilaterally, no wheezes/rales/rhonchi Cardio: Regular rate and rhythm, S1, S2 normal, no murmur, click, rub or gallop, no edema Breasts:  Deferred  GI: Soft, not distended, non-tender, hypoactive bowel sounds, no organomegaly Skin: No rashes/lesions, skin warm and dry, no erythematous areas, no cyanosis  M/S:  Atraumatic, normal strength in all extremities, normal range of motion, no clubbing  Lymph nodes: Cervical, supraclavicular, and axillary nodes  normal Neurologic: Grossly normal, cranial nerves II through XII intact, alert and oriented x 3 Psych: Appropriate affect   LABORATORY DATA: Lab Results  Component Value Date   WBC 5.9 02/11/2013   HGB 13.7 02/11/2013   HCT 41.0 02/11/2013   MCV 96.2 02/11/2013   PLT 224 02/11/2013      Chemistry      Component Value Date/Time   NA 142 02/11/2013 1009   NA 141 05/19/2012 0710   K 3.9 02/11/2013 1009   K 4.1 05/19/2012 0710   CL 106 08/20/2012 0802   CL 107 05/19/2012 0710   CO2 27 02/11/2013 1009   CO2 25 05/19/2012 0710   BUN 17.7 02/11/2013 1009   BUN 9 05/19/2012 0710   CREATININE 0.8 02/11/2013 1009   CREATININE 0.77 05/19/2012 0710   CREATININE 0.82 05/20/2011 1204      Component Value Date/Time   CALCIUM 10.2 02/11/2013 1009   CALCIUM 8.7 05/19/2012 0710   ALKPHOS 77 02/11/2013 1009   ALKPHOS 80 05/20/2011 1204   AST 18 02/11/2013 1009   AST 21 05/20/2011 1204   ALT 16 02/11/2013 1009   ALT 29 05/20/2011 1204   BILITOT 0.26 02/11/2013 1009   BILITOT 0.3 05/20/2011 1204      RADIOGRAPHIC STUDIES: No results found.    ASSESSMENT:   JOZEY JANCO is a 52 y.o. female:  #1  Seen in the multidisciplinary breast clinic in 11/2011 for new diagnosis of invasive lobular carcinoma of the right breast and DCIS of the left breast.   #2 Neoadjuvant chemotherapy consisting of FEC 100 (5-FU/Epirubicin/Cytoxan) received from 12/08/2011 through 01/19/2012 x 4 cycles.  This was followed by neoadjuvant Taxol as a single agent for total of 12 weeks from 02/02/2012 through 04/20/2012.  #3 Status post bilateral mastectomies performed on 05/18/2012 in which pathology showed:  (a) Left breast simple mastectomy with left axillary sentinel node biopsy for a stage 0, ypTis, pN0, 1.5 cm ductal carcinoma in situ, grade 2, estrogen receptor 81% positive, progesterone receptor 74% positive, with 0/3 of metastatic left axillary lymph nodes.  (b) Right breast simple mastectomy with right axillary regional  lymph node resection for a stage IIIA, ypT2, pN2a, pMX, 2.3 cm invasive lobular carcinoma, grade 2, with  lobular carcinoma in situ, estrogen receptor 95% positive, progesterone receptor 53% positive, Ki-67 at 85%, HER-2/neu by CISH no amplification, with 8/17 metastatic left axillary lymph nodes and 0/1 lymph node positive for isolated tumor cell.  #4 Radiation therapy from 07/04/2012 through 08/17/2012.  #5 Began antiestrogen therapy with Tamoxifen 20 mg by mouth daily in 08/2012.  #6 Started monthly Zoladex injections on 06/04/2012 with hormone levels being checked periodically.  #7 Hot flashes and night sweats   PLAN:  #1 Mary [name] will continue antiestrogen therapy with Tamoxifen 20 mg by mouth daily.  She states she does not need a refill of this medication currently.  She will continue receiving Zoladex injections monthly and will receive an injection today.  Hormone laboratory levels checked today are pending.  #2 Mary [name] was previously provided with a prescription for venlafaxine 35 mg by mouth daily for hot flashes/night sweats, but states she has not used this medication yet.  She was asked to try gabapentin 300 mg by mouth each bedtime or gabapentin 100 mg by mouth twice a day for hot flashes and night sweats and see which regimen works better for her.  Drowsiness associated with gabapentin was explained to her.  #3 We plan to see Mary [name] again in 3 months at which time we will check laboratories of CBC, CMP, estradiol, FSH, and LH.  She will continue monthly Zoladex injections.  All questions answered.  Mary [name] encouraged to contact us in the interim with any questions, concerns, or problems.  Larina Bras, NP-C 02/11/2013  7:58 PM   ATTENDING'S ATTESTATION:  I personally reviewed patient's chart, examined patient myself, formulated the treatment plan as followed.    Iveth looks excellent. She is tolerating the antiestrogen therapy very well. She does get  Zoladex injections as well. She is in great spirits. I will see her back in 3 months time.  Drue Second, MD Medical/Oncology [facility] [phone] (beeper) [phone] (Office)  02/20/2013, 10:03 AM

## 2013-02-11 NOTE — Patient Instructions (Addendum)
Please contact us at [phone] if you have any questions or concerns.  Please continue to do well and enjoy life!!!  Get plenty of rest, drink plenty of water, exercise daily (walking as tolerated), eat a balanced diet.  Take  D3 1000 IUs daily.   Complete monthly self-breast examinations.  Have a clinical breast exam by a physician every year.  Results for orders placed in visit on 02/11/13 (from the past 24 hour(s))  CBC WITH DIFFERENTIAL     Status: None   Collection Time    02/11/13 10:08 AM      Result Value Range   WBC 5.9  3.9 - 10.3 10e3/uL   NEUT# 4.4  1.5 - 6.5 10e3/uL   HGB 13.7  11.6 - 15.9 g/dL   HCT 16.1  09.6 - 04.5 %   Platelets 224  145 - 400 10e3/uL   MCV 96.2  79.5 - 101.0 fL   MCH 32.2  25.1 - 34.0 pg   MCHC 33.5  31.5 - 36.0 g/dL   RBC 4.09  8.11 - 9.14 10e6/uL   RDW 13.2  11.2 - 14.5 %   lymph# 1.0  0.9 - 3.3 10e3/uL   MONO# 0.4  0.1 - 0.9 10e3/uL   Eosinophils Absolute 0.1  0.0 - 0.5 10e3/uL   Basophils Absolute 0.0  0.0 - 0.1 10e3/uL   NEUT% 74.0  38.4 - 76.8 %   LYMPH% 16.3  14.0 - 49.7 %   MONO% 6.7  0.0 - 14.0 %   EOS% 2.2  0.0 - 7.0 %   BASO% 0.8  0.0 - 2.0 %   Narrative:    Performed At:  [facility]               [address]               [place], [place] [address]  COMPREHENSIVE METABOLIC PANEL (CC13)     Status: None   Collection Time    02/11/13 10:09 AM      Result Value Range   Sodium 142  136 - 145 mEq/L   Potassium 3.9  3.5 - 5.1 mEq/L   Chloride 105  98 - 109 mEq/L   CO2 27  22 - 29 mEq/L   Glucose 111  70 - 140 mg/dl   BUN 62.1  7.0 - 30.8 mg/dL   Creatinine 0.8  0.6 - 1.1 mg/dL   Total Bilirubin 6.57  0.20 - 1.20 mg/dL   Alkaline Phosphatase 77  40 - 150 U/L   AST 18  5 - 34 U/L   ALT 16  0 - 55 U/L   Total Protein 7.2  6.4 - 8.3 g/dL   Albumin 3.7  3.5 - 5.0 g/dL   Calcium 84.6  8.4 - 96.2 mg/dL   Anion Gap 10  3 - 11 mEq/L   Narrative:    Performed At:  [facility]               [address]               [place], [place] [address]

## 2013-02-11 NOTE — Telephone Encounter (Signed)
appts made and printed...td 

## 2013-02-12 LAB — FOLLICLE STIMULATING HORMONE: FSH: 2.6 m[IU]/mL

## 2013-02-12 LAB — LUTEINIZING HORMONE: LH: 0.2 m[IU]/mL

## 2013-02-14 LAB — ESTRADIOL, ULTRA SENS: Estradiol, Ultra Sensitive: 18 pg/mL

## 2013-02-27 ENCOUNTER — Telehealth: Payer: Self-pay | Admitting: Oncology

## 2013-02-27 ENCOUNTER — Telehealth: Payer: Self-pay | Admitting: *Deleted

## 2013-02-27 NOTE — Telephone Encounter (Signed)
, °

## 2013-02-27 NOTE — Telephone Encounter (Signed)
Pt called requested appt on 12/24 be moved to 12/29. pof to scheduler

## 2013-03-13 ENCOUNTER — Ambulatory Visit: Payer: 59

## 2013-03-18 ENCOUNTER — Ambulatory Visit (HOSPITAL_BASED_OUTPATIENT_CLINIC_OR_DEPARTMENT_OTHER): Payer: 59

## 2013-03-18 VITALS — BP 142/75 | HR 87 | Temp 98.5°F

## 2013-03-18 DIAGNOSIS — C50419 Malignant neoplasm of upper-outer quadrant of unspecified female breast: Secondary | ICD-10-CM

## 2013-03-18 DIAGNOSIS — Z5111 Encounter for antineoplastic chemotherapy: Secondary | ICD-10-CM

## 2013-03-18 DIAGNOSIS — C50411 Malignant neoplasm of upper-outer quadrant of right female breast: Secondary | ICD-10-CM

## 2013-03-18 DIAGNOSIS — C50912 Malignant neoplasm of unspecified site of left female breast: Secondary | ICD-10-CM

## 2013-03-18 MED ORDER — GOSERELIN ACETATE 3.6 MG ~~LOC~~ IMPL
3.6000 mg | DRUG_IMPLANT | Freq: Once | SUBCUTANEOUS | Status: AC
  Administered 2013-03-18: 3.6 mg via SUBCUTANEOUS
  Filled 2013-03-18: qty 3.6

## 2013-03-25 ENCOUNTER — Other Ambulatory Visit: Payer: Self-pay | Admitting: Emergency Medicine

## 2013-03-25 DIAGNOSIS — C50411 Malignant neoplasm of upper-outer quadrant of right female breast: Secondary | ICD-10-CM

## 2013-03-25 MED ORDER — LORAZEPAM 0.5 MG PO TABS
0.5000 mg | ORAL_TABLET | Freq: Three times a day (TID) | ORAL | Status: DC
Start: 2013-03-25 — End: 2013-06-28

## 2013-04-17 ENCOUNTER — Other Ambulatory Visit: Payer: Self-pay | Admitting: Oncology

## 2013-04-17 ENCOUNTER — Ambulatory Visit (HOSPITAL_BASED_OUTPATIENT_CLINIC_OR_DEPARTMENT_OTHER): Payer: 59

## 2013-04-17 VITALS — BP 145/76 | HR 78 | Temp 98.3°F

## 2013-04-17 DIAGNOSIS — C50912 Malignant neoplasm of unspecified site of left female breast: Secondary | ICD-10-CM

## 2013-04-17 DIAGNOSIS — C50419 Malignant neoplasm of upper-outer quadrant of unspecified female breast: Secondary | ICD-10-CM

## 2013-04-17 DIAGNOSIS — D0590 Unspecified type of carcinoma in situ of unspecified breast: Secondary | ICD-10-CM

## 2013-04-17 DIAGNOSIS — Z5111 Encounter for antineoplastic chemotherapy: Secondary | ICD-10-CM

## 2013-04-17 MED ORDER — GOSERELIN ACETATE 3.6 MG ~~LOC~~ IMPL
3.6000 mg | DRUG_IMPLANT | Freq: Once | SUBCUTANEOUS | Status: AC
  Administered 2013-04-17: 3.6 mg via SUBCUTANEOUS
  Filled 2013-04-17: qty 3.6

## 2013-04-17 NOTE — Patient Instructions (Signed)
Goserelin injection What is this medicine? GOSERELIN (GOE se rel in) is similar to a hormone found in the body. It lowers the amount of sex hormones that the body makes. Men will have lower testosterone levels and women will have lower estrogen levels while taking this medicine. In men, this medicine is used to treat prostate cancer; the injection is either given once per month or once every 12 weeks. A once per month injection (only) is used to treat women with endometriosis, dysfunctional uterine bleeding, or advanced breast cancer. This medicine may be used for other purposes; ask your health care provider or pharmacist if you have questions. COMMON BRAND NAME(S): Zoladex What should I tell my health care provider before I take this medicine? They need to know if you have any of these conditions (some only apply to women): -diabetes -heart disease or previous heart attack -high blood pressure -high cholesterol -kidney disease -osteoporosis or low bone density -problems passing urine -spinal cord injury -stroke -tobacco smoker -an unusual or allergic reaction to goserelin, hormone therapy, other medicines, foods, dyes, or preservatives -pregnant or trying to get pregnant -breast-feeding How should I use this medicine? This medicine is for injection under the skin. It is given by a health care professional in a hospital or clinic setting. Men receive this injection once every 4 weeks or once every 12 weeks. Women will only receive the once every 4 weeks injection. Talk to your pediatrician regarding the use of this medicine in children. Special care may be needed. Overdosage: If you think you have taken too much of this medicine contact a poison control center or emergency room at once. NOTE: This medicine is only for you. Do not share this medicine with others. What if I miss a dose? It is important not to miss your dose. Call your doctor or health care professional if you are unable to  keep an appointment. What may interact with this medicine? -female hormones like estrogen -herbal or dietary supplements like black cohosh, chasteberry, or DHEA -female hormones like testosterone -prasterone This list may not describe all possible interactions. Give your health care provider a list of all the medicines, herbs, non-prescription drugs, or dietary supplements you use. Also tell them if you smoke, drink alcohol, or use illegal drugs. Some items may interact with your medicine. What should I watch for while using this medicine? Visit your doctor or health care professional for regular checks on your progress. Your symptoms may appear to get worse during the first weeks of this therapy. Tell your doctor or healthcare professional if your symptoms do not start to get better or if they get worse after this time. Your bones may get weaker if you take this medicine for a long time. If you smoke or frequently drink alcohol you may increase your risk of bone loss. A family history of osteoporosis, chronic use of drugs for seizures (convulsions), or corticosteroids can also increase your risk of bone loss. Talk to your doctor about how to keep your bones strong. This medicine should stop regular monthly menstration in women. Tell your doctor if you continue to menstrate. Women should not become pregnant while taking this medicine or for 12 weeks after stopping this medicine. Women should inform their doctor if they wish to become pregnant or think they might be pregnant. There is a potential for serious side effects to an unborn child. Talk to your health care professional or pharmacist for more information. Do not breast-feed an infant while taking   this medicine. Men should inform their doctors if they wish to father a child. This medicine may lower sperm counts. Talk to your health care professional or pharmacist for more information. What side effects may I notice from receiving this  medicine? Side effects that you should report to your doctor or health care professional as soon as possible: -allergic reactions like skin rash, itching or hives, swelling of the face, lips, or tongue -bone pain -breathing problems -changes in vision -chest pain -feeling faint or lightheaded, falls -fever, chills -pain, swelling, warmth in the leg -pain, tingling, numbness in the hands or feet -swelling of the ankles, feet, hands -trouble passing urine or change in the amount of urine -unusually high or low blood pressure -unusually weak or tired Side effects that usually do not require medical attention (report to your doctor or health care professional if they continue or are bothersome): -change in sex drive or performance -changes in breast size in both males and females -changes in emotions or moods -headache -hot flashes -irritation at site where injected -loss of appetite -skin problems like acne, dry skin -vaginal dryness This list may not describe all possible side effects. Call your doctor for medical advice about side effects. You may report side effects to FDA at 1-800-FDA-1088. Where should I keep my medicine? This drug is given in a hospital or clinic and will not be stored at home. NOTE: This sheet is a summary. It may not cover all possible information. If you have questions about this medicine, talk to your doctor, pharmacist, or health care provider.  2014, Elsevier/Gold Standard. (2008-07-22 13:28:29)  

## 2013-04-25 ENCOUNTER — Ambulatory Visit (INDEPENDENT_AMBULATORY_CARE_PROVIDER_SITE_OTHER): Payer: Commercial Managed Care - PPO | Admitting: General Surgery

## 2013-04-25 ENCOUNTER — Encounter (INDEPENDENT_AMBULATORY_CARE_PROVIDER_SITE_OTHER): Payer: Self-pay | Admitting: General Surgery

## 2013-04-25 VITALS — BP 140/82 | HR 76 | Temp 98.2°F | Resp 14 | Ht 64.0 in | Wt 161.0 lb

## 2013-04-25 DIAGNOSIS — C50419 Malignant neoplasm of upper-outer quadrant of unspecified female breast: Secondary | ICD-10-CM

## 2013-04-25 NOTE — Patient Instructions (Signed)
Examination of your bilateral mastectomy wounds and all of the lymph node areas is normal. There is no evidence of cancer.  Continue to take tamoxifen and keep your regular appointments with Dr. Marcy Panning.  Return to see Dr. Dalbert Batman in one year.

## 2013-04-25 NOTE — Progress Notes (Signed)
**Note Mary-Identified via Obfuscation** Patient ID: Mary [name], female   DOB: 1960-06-17, 53 y.o.   MRN: [id number] History: The patient returns for LTFU.  Recall that she was diagnosed with locally advanced cancer of the right breast in September 2013. underwent neoadjuvant chemotherapy for locally advanced cancer of the right breast, upper outer quadrant, receptor positive, HER-2-negative, T3 N1 on the right and DCIS in the left.  On May 18, 2012 she underwent right modified radical mastectomy, left total mastectomy and sentinel biopsy and removal of Port-A-Cath.  Pathology on the right showed invasive lobular carcinoma, ypT2, ypN2a, 8 of 17 lymph nodes positive, there was good downstaging of the primary tumor. Left side showed DCIS.  She completed her radiation therapy. She's feeling pretty good now. She has full range of motion of her shoulders. She is fully active without restriction.  In terms of breast reconstruction, she is still somewhat interested, and states she will return to see Dr. Harlow Mares in the future when she is ready, but not now.   Past history, family history, review of systems are documented in the chart, unchanged, and noncontributory except as described above.  Exam: Patient looks good. No distress.  Neck  no adenopathy or mass  Lungs clear to auscultation bilaterally Heart regular rate and rhythm. No murmur no ectopy. Breasts bilateral mastectomy incisions well healed. No nodules, ulceration, or fluid collection. Radiation changes have resolved.. Port-A-Cath site also well healed. No axillary mass. Range of motion 100%, both shoulders   Assessment: Receptor positive, HER-2-negative invasive lobular carcinoma right breast, locally advanced ypT2, ypN2a  DCIS left breast  Uneventful healing following right modified radical mastectomy and left total mastectomy and sentinel node biopsy and removal of Port-A-Cath. Date of surgery February, 2014   Plan: Continue tamoxifen and close clinical followup with Dr.  Marcy Panning She will contact Dr. Crissie Reese when she feels ready to have further consultative advice regarding reconstruction  Return to see me in one year   Kileigh Ortmann M. Dalbert Batman, M.D., [facility]  General and Minimally invasive Surgery  Breast and Colorectal Surgery  Office: [phone]  Pager: [phone]

## 2013-05-13 ENCOUNTER — Other Ambulatory Visit: Payer: Self-pay

## 2013-05-13 DIAGNOSIS — C50419 Malignant neoplasm of upper-outer quadrant of unspecified female breast: Secondary | ICD-10-CM

## 2013-05-14 ENCOUNTER — Ambulatory Visit: Payer: 59

## 2013-05-14 ENCOUNTER — Ambulatory Visit: Payer: 59 | Admitting: Oncology

## 2013-05-14 ENCOUNTER — Telehealth: Payer: Self-pay | Admitting: Oncology

## 2013-05-14 ENCOUNTER — Other Ambulatory Visit: Payer: 59

## 2013-05-14 NOTE — Telephone Encounter (Signed)
pt called to r/s 2/24 appts. lb/inj r/s to 2/27 per pt. no availability for f/u. message to [place] re new d/t. pt aware

## 2013-05-16 ENCOUNTER — Telehealth: Payer: Self-pay | Admitting: Oncology

## 2013-05-16 NOTE — Telephone Encounter (Signed)
per staff message response from [place] she can see pt for f/u 3/12 @ 2pm. lmonvm for pt re appt and asked that pt get new schedule when she comes in 2/27. also added comment to 2/27 appt to send pt for new schedule.

## 2013-05-17 ENCOUNTER — Ambulatory Visit (HOSPITAL_BASED_OUTPATIENT_CLINIC_OR_DEPARTMENT_OTHER): Payer: 59

## 2013-05-17 ENCOUNTER — Other Ambulatory Visit (HOSPITAL_BASED_OUTPATIENT_CLINIC_OR_DEPARTMENT_OTHER): Payer: 59

## 2013-05-17 VITALS — BP 150/72 | HR 85 | Temp 98.2°F

## 2013-05-17 DIAGNOSIS — C773 Secondary and unspecified malignant neoplasm of axilla and upper limb lymph nodes: Secondary | ICD-10-CM

## 2013-05-17 DIAGNOSIS — C50419 Malignant neoplasm of upper-outer quadrant of unspecified female breast: Secondary | ICD-10-CM

## 2013-05-17 DIAGNOSIS — Z5111 Encounter for antineoplastic chemotherapy: Secondary | ICD-10-CM

## 2013-05-17 DIAGNOSIS — C50912 Malignant neoplasm of unspecified site of left female breast: Secondary | ICD-10-CM

## 2013-05-17 LAB — CBC WITH DIFFERENTIAL/PLATELET
BASO%: 0.5 % (ref 0.0–2.0)
Basophils Absolute: 0 10*3/uL (ref 0.0–0.1)
EOS ABS: 0.1 10*3/uL (ref 0.0–0.5)
EOS%: 1 % (ref 0.0–7.0)
HCT: 42.1 % (ref 34.8–46.6)
HGB: 13.9 g/dL (ref 11.6–15.9)
LYMPH%: 18.4 % (ref 14.0–49.7)
MCH: 31.5 pg (ref 25.1–34.0)
MCHC: 33 g/dL (ref 31.5–36.0)
MCV: 95.5 fL (ref 79.5–101.0)
MONO#: 0.5 10*3/uL (ref 0.1–0.9)
MONO%: 7.5 % (ref 0.0–14.0)
NEUT%: 72.6 % (ref 38.4–76.8)
NEUTROS ABS: 4.5 10*3/uL (ref 1.5–6.5)
Platelets: 224 10*3/uL (ref 145–400)
RBC: 4.41 10*6/uL (ref 3.70–5.45)
RDW: 12.6 % (ref 11.2–14.5)
WBC: 6.2 10*3/uL (ref 3.9–10.3)
lymph#: 1.1 10*3/uL (ref 0.9–3.3)

## 2013-05-17 LAB — COMPREHENSIVE METABOLIC PANEL (CC13)
ALT: 19 U/L (ref 0–55)
ANION GAP: 11 meq/L (ref 3–11)
AST: 20 U/L (ref 5–34)
Albumin: 3.8 g/dL (ref 3.5–5.0)
Alkaline Phosphatase: 81 U/L (ref 40–150)
BUN: 16.4 mg/dL (ref 7.0–26.0)
CALCIUM: 9.4 mg/dL (ref 8.4–10.4)
CO2: 23 meq/L (ref 22–29)
Chloride: 107 mEq/L (ref 98–109)
Creatinine: 0.8 mg/dL (ref 0.6–1.1)
Glucose: 105 mg/dl (ref 70–140)
Potassium: 3.8 mEq/L (ref 3.5–5.1)
SODIUM: 141 meq/L (ref 136–145)
TOTAL PROTEIN: 7.2 g/dL (ref 6.4–8.3)
Total Bilirubin: 0.3 mg/dL (ref 0.20–1.20)

## 2013-05-17 MED ORDER — GOSERELIN ACETATE 3.6 MG ~~LOC~~ IMPL
3.6000 mg | DRUG_IMPLANT | Freq: Once | SUBCUTANEOUS | Status: AC
  Administered 2013-05-17: 3.6 mg via SUBCUTANEOUS
  Filled 2013-05-17: qty 3.6

## 2013-05-29 ENCOUNTER — Other Ambulatory Visit: Payer: Self-pay

## 2013-05-30 ENCOUNTER — Telehealth: Payer: Self-pay

## 2013-05-30 ENCOUNTER — Telehealth: Payer: Self-pay | Admitting: Oncology

## 2013-05-30 ENCOUNTER — Ambulatory Visit (HOSPITAL_BASED_OUTPATIENT_CLINIC_OR_DEPARTMENT_OTHER): Payer: 59 | Admitting: Oncology

## 2013-05-30 ENCOUNTER — Other Ambulatory Visit: Payer: Self-pay

## 2013-05-30 ENCOUNTER — Encounter: Payer: Self-pay | Admitting: Oncology

## 2013-05-30 VITALS — BP 147/88 | HR 91 | Temp 98.0°F | Resp 18 | Ht 64.0 in | Wt 165.0 lb

## 2013-05-30 DIAGNOSIS — N959 Unspecified menopausal and perimenopausal disorder: Secondary | ICD-10-CM

## 2013-05-30 DIAGNOSIS — C773 Secondary and unspecified malignant neoplasm of axilla and upper limb lymph nodes: Secondary | ICD-10-CM

## 2013-05-30 DIAGNOSIS — C50419 Malignant neoplasm of upper-outer quadrant of unspecified female breast: Secondary | ICD-10-CM

## 2013-05-30 DIAGNOSIS — D0590 Unspecified type of carcinoma in situ of unspecified breast: Secondary | ICD-10-CM

## 2013-05-30 MED ORDER — TAMOXIFEN CITRATE 20 MG PO TABS: 20.0000 mg | ORAL_TABLET | Freq: Every day | ORAL | Status: DC

## 2013-05-30 MED ORDER — EXEMESTANE 25 MG PO TABS: 25.0000 mg | ORAL_TABLET | Freq: Every day | ORAL | Status: DC

## 2013-05-30 NOTE — Telephone Encounter (Signed)
, °

## 2013-05-30 NOTE — Patient Instructions (Signed)
Exemestane tablets  What is this medicine?  EXEMESTANE (ex e MES tane) blocks the production of the hormone estrogen. Some types of breast cancer depend on estrogen to grow, and this medicine can stop tumor growth by blocking estrogen production. This medicine is for the treatment of breast cancer in postmenopausal women only.  This medicine may be used for other purposes; ask your health care provider or pharmacist if you have questions.  COMMON BRAND NAME(S): Aromasin  What should I tell my health care provider before I take this medicine?  They need to know if you have any of these conditions:  -an unusual or allergic reaction to exemestane, other medicines, foods, dyes, or preservatives  -pregnant or trying to get pregnant  -breast-feeding  How should I use this medicine?  Take this medicine by mouth with a glass of water. Follow the directions on the prescription label. Take your doses at regular intervals after a meal. Do not take your medicine more often than directed. Do not stop taking except on the advice of your doctor or health care professional.  Contact your pediatrician regarding the use of this medicine in children. Special care may be needed.  Overdosage: If you think you have taken too much of this medicine contact a poison control center or emergency room at once.  NOTE: This medicine is only for you. Do not share this medicine with others.  What if I miss a dose?  If you miss a dose, take the next dose as usual. Do not try to make up the missed dose. Do not take double or extra doses.  What may interact with this medicine?  Do not take this medicine with any of the following medications:  -female hormones, like estrogens and birth control pills  This medicine may also interact with the following medications:  -androstenedione  -phenytoin  -rifabutin, rifampin, or rifapentine  -St. John's Wort  This list may not describe all possible interactions. Give your health care provider a list of all the  medicines, herbs, non-prescription drugs, or dietary supplements you use. Also tell them if you smoke, drink alcohol, or use illegal drugs. Some items may interact with your medicine.  What should I watch for while using this medicine?  Visit your doctor or health care professional for regular checks on your progress.  If you experience hot flashes or sweating while taking this medicine, avoid alcohol, smoking and drinks with caffeine. This may help to decrease these side effects.  What side effects may I notice from receiving this medicine?  Side effects that you should report to your doctor or health care professional as soon as possible:  -any new or unusual symptoms  -changes in vision  -fever  -leg or arm swelling  -pain in bones, joints, or muscles  -pain in hips, back, ribs, arms, shoulders, or legs  Side effects that usually do not require medical attention (report to your doctor or health care professional if they continue or are bothersome):  -difficulty sleeping  -headache  -hot flashes  -sweating  -unusually weak or tired  This list may not describe all possible side effects. Call your doctor for medical advice about side effects. You may report side effects to FDA at 1-800-FDA-1088.  Where should I keep my medicine?  Keep out of the reach of children.  Store at room temperature between 15 and 30 degrees C (59 and 86 degrees F). Throw away any unused medicine after the expiration date.  NOTE: This sheet 

## 2013-05-30 NOTE — Telephone Encounter (Signed)
Mary [name] OP rehab at [facility] called re-additional information for referral to [facility].  Unable to find pt in her work queue.  Provided pt identifiers.  Mary [name] said Malachy Mood is at [facility] location - which is why she couldn't find it.  She will fix the problem for [place].

## 2013-06-04 NOTE — Progress Notes (Signed)
OFFICE PROGRESS NOTE  CC  TULLO,TERESA, MD [address] [place] [address] Dr. Arloa Koh Dr. 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 E  PRIOR THERAPY:  #1 patient was originally seen in the multidisciplinary breast clinic for new diagnosis of invasive lobular carcinoma of the right breast with DCIS of the left breast.   #2 patient has begun FEC 100 starting on 12/08/2011 - 01/18/12. [name] total of 4 cycles of this chemotherapy is planned and then thereafter she will proceed with Taxol as [name] single agent for total of 12 weeks.  #3 patient is status post single agent Taxol x12 weeks from 02/02/2012 through January 2014.  #4patient is status post bilateral mastectomies performed on 05/18/2012. She had [name] right axillary lymph node dissection and left sentinel lymph node biopsy. Her port was removed also. Her final pathology on the right revealed invasive  Lobular carcinoma measuring 2.3 cm 8 of 17 lymph nodes were positive (T2 N2A). 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  5 completed her radiation therapy she overall tolerated very well. Her treatments were administered from 07/04/2012 through 08/17/2012.  #6 Continues on  tamoxifen 20 mg daily since June 2014 - 05/30/13. She was switched to aromasin 25 mg daily with zoladex q monthly   CURRENT THERAPY Aromasin 25 mg daily/Zoladex q. Monthly starting 05/30/2013.  INTERVAL HISTORY: Mary [name] 53 y.[name]. female returns for follow-up visit today. Overall she is doing well. She continues to have hot flashes. She denies any aches and pains. She is working full-time. Patient and I discussed switching her to Aromasin along with Zoladex. Based on recent studies. She understands the rationale for this. It is going to be switched over. We discussed side  effects of Aromasin risks and benefits. Patient is also having significant ongoing issues with sexuality. We discussed getting her to the physical therapist Earlie Counts for pelvic exercises. We also discussed the possibility of her gone with her husband for couples counseling. She appreciated this conversation. Today she denies any headaches double vision blurring of vision fevers chills night sweats. No shortness of breath chest pains palpitations. No abdominal pain no diarrhea or constipation. She has no easy bruising or bleeding. She has no myalgias and arthralgias. No peripheral paresthesias or gait disturbances. Remainder of the 10 point review of systems is negative.   MEDICAL HISTORY: Past Medical History  Diagnosis Date  . Irritable bowel syndrome   . Dental crowns present     x 4  . Neutropenia, drug-induced 12/15/2011  . Breast cancer August 2013    bilateral, er/pr+  . Complication of anesthesia     pt states she had the shakes extremely bad  . Cough     at night d/t reflux;nothing productive  . History of bronchitis     08/2011  . GERD (gastroesophageal reflux disease)     takes Nexium daily  . IBS (irritable bowel syndrome)     immodium prn  . History of colon polyps   . Urinary frequency   . Nocturia   . Insomnia     takes Ambien nightly  . Anxiety     takes Xanax prn  . Plantar fasciitis   . Hot flashes, menopausal 08/08/2012  . Status post chemotherapy     FEC 100 starting on 12/08/2011 - 01/18/12/single agent  Taxol x12 weeks from 02/02/2012 through January 2014   . S/P radiation therapy 07/03/2012 through 08/18/2038                                                         Right chest wall/regional lymph nodes 5040 cGy 28 sessions, right chest wall/mastectomy scar boost 1000 cGy 5 sessions                          . Use of tamoxifen (Nolvadex)     ALLERGIES:  is allergic to adhesive.  MEDICATIONS:  Current Outpatient Prescriptions  Medication Sig Dispense Refill   . ALPRAZolam (XANAX) 0.25 MG tablet TAKE ONE TABLET BY MOUTH TWICE DAILY AS NEEDED FOR ANXIETY  60 tablet  4  . Ascorbic Acid (VITAMIN C) 100 MG tablet Take 100 mg by mouth daily.        . Calcium Carbonate-Vitamin D (CALTRATE 600+D PO) Take 1 tablet by mouth daily.      . cholecalciferol (VITAMIN D) 400 UNITS TABS Take 1,000 Units by mouth daily.       Marland Kitchen esomeprazole (NEXIUM) 40 MG capsule Take 40 mg by mouth daily before breakfast.      . loperamide (IMODIUM [name]-D) 2 MG tablet Take 2 mg by mouth 4 (four) times daily as needed for diarrhea or loose stools.       Marland Kitchen LORazepam (ATIVAN) 0.5 MG tablet Take 1 tablet (0.5 mg total) by mouth every 8 (eight) hours.  60 tablet  2  . Multiple Vitamin (MULTIVITAMIN) tablet Take 1 tablet by mouth daily.        . vitamin B-12 (CYANOCOBALAMIN) 1000 MCG tablet Take 1,000 mcg by mouth daily.      . calcium carbonate (OS-CAL) 600 MG TABS tablet Take 600 mg by mouth 2 (two) times daily with [name] meal.      . exemestane (AROMASIN) 25 MG tablet Take 1 tablet (25 mg total) by mouth daily after breakfast.  30 tablet  12   No current facility-administered medications for this visit.    SURGICAL HISTORY:  Past Surgical History  Procedure Laterality Date  . Portacath placement  12/02/2011    Procedure: INSERTION PORT-[name]-CATH;  Surgeon: Ernestene Mention, MD;  Location: [facility];  Service: General;  Laterality: N/[name];  insertion port [name] cath with fluoroscopy  . Breast lumpectomy  2008    left  . Colonoscopy    . Mastectomy with axillary lymph node dissection Right 05/18/2012    Procedure: MASTECTOMY WITH AXILLARY LYMPH NODE DISSECTION;  Surgeon: Ernestene Mention, MD;  Location: [facility] OR;  Service: General;  Laterality: Right;  Bilateral Total Mastectomy , right axillary lymph node dissection left axillary sentinel node biopsy, removal of Porta Cath  . Simple mastectomy with axillary sentinel node biopsy Left 05/18/2012    Procedure: SIMPLE MASTECTOMY WITH  AXILLARY SENTINEL NODE BIOPSY;  Surgeon: Ernestene Mention, MD;  Location: [facility] OR;  Service: General;  Laterality: Left;  . Port-[name]-cath removal N/[name] 05/18/2012    Procedure: REMOVAL PORT-[name]-CATH;  Surgeon: Ernestene Mention, MD;  Location: [facility] OR;  Service: General;  Laterality: N/[name];     PHYSICAL EXAMINATION:  BP 147/88  Pulse 91  Temp(Src) 98 F (36.7 C) (Oral)  Resp 18  Ht  $'5\' 4"'g$  (1.626 m)  Wt 165 lb (74.844 kg)  BMI 28.31 kg/m2 General: Patient is [name] well appearing female in no acute distress HEENT: PERRLA, sclerae anicteric no conjunctival pallor, MMM Neck: supple, no palpable adenopathy Lungs: clear to auscultation bilaterally, no wheezes, rhonchi, or rales Cardiovascular: regular rate rhythm, S1, S2, no murmurs, rubs or gallops Abdomen: Soft, non-tender, non-distended, normoactive bowel sounds, no HSM Extremities: warm and well perfused, no clubbing, cyanosis, or edema Skin: No lesions or rash Neuro: Non-focal Breast:status post bilateral mastectomies no evidence of local recurrence  ECOG PERFORMANCE STATUS: 0 - Asymptomatic   LABORATORY DATA: Lab Results  Component Value Date   WBC 6.2 05/17/2013   HGB 13.9 05/17/2013   HCT 42.1 05/17/2013   MCV 95.5 05/17/2013   PLT 224 05/17/2013      Chemistry      Component Value Date/Time   NA 141 05/17/2013 1118   NA 141 05/19/2012 0710   K 3.8 05/17/2013 1118   K 4.1 05/19/2012 0710   CL 106 08/20/2012 0802   CL 107 05/19/2012 0710   CO2 23 05/17/2013 1118   CO2 25 05/19/2012 0710   BUN 16.4 05/17/2013 1118   BUN 9 05/19/2012 0710   CREATININE 0.8 05/17/2013 1118   CREATININE 0.77 05/19/2012 0710   CREATININE 0.82 05/20/2011 1204      Component Value Date/Time   CALCIUM 9.4 05/17/2013 1118   CALCIUM 8.7 05/19/2012 0710   ALKPHOS 81 05/17/2013 1118   ALKPHOS 80 05/20/2011 1204   AST 20 05/17/2013 1118   AST 21 05/20/2011 1204   ALT 19 05/17/2013 1118   ALT 29 05/20/2011 1204   BILITOT 0.30 05/17/2013 1118   BILITOT 0.3 05/20/2011 1204    ADDITIONAL  INFORMATION: 4. CHROMOGENIC IN-SITU HYBRIDIZATION (BLOCK 4C) Interpretation HER-2/NEU BY CISH - NO AMPLIFICATION OF HER-2 DETECTED. THE RATIO OF HER-2: CEP 17 SIGNALS WAS 1.36. Reference range: Ratio: HER2:CEP17 < 1.8 - gene amplification not observed Ratio: HER2:CEP 17 1.8-2.2 - equivocal result Ratio: HER2:CEP17 > 2.2 - gene amplification observed CHROMOGENIC IN-SITU HYBRIDIZATION (BLOCK 4E) Interpretation HER-2/NEU BY CISH - NO AMPLIFICATION OF HER-2 DETECTED. THE RATIO OF HER-2: CEP 17 SIGNALS WAS 0.90. Reference range: Ratio: HER2:CEP17 < 1.8 - gene amplification not observed Ratio: HER2:CEP 17 1.8-2.2 - equivocal result Ratio: HER2:CEP17 > 2.2 - gene amplification observed Enid Cutter MD Pathologist, Electronic Signature ( Signed 05/28/2012) FINAL DIAGNOSIS Diagnosis 1. Lymph node, sentinel, biopsy, Left axilla #1 - ONE LYMPH NODE, NEGATIVE FOR TUMOR (0/1). 1 of 5 FINAL for Mary [name], Mary [name] [phone]) Diagnosis(continued) - SEE COMMENT. 2. Lymph node, sentinel, biopsy, Left axilla #2 - ONE LYMPH NODE, NEGATIVE FOR TUMOR (0/1). - SEE COMMENT. 3. Breast, simple mastectomy, Left - DUCTAL CARCINOMA IN SITU, SEE COMMENT. - IN SITU CARCINOMA IS 1.5 CM FROM THE NEAREST MARGIN (DEEP). - ONE LYMPH NODE, NEGATIVE FOR TUMOR (0/1). - SEE TUMOR SYNOPTIC TEMPLATE BELOW. 4. Breast, simple mastectomy, Right, with axillary contents - INVASIVE LOBULAR CARCINOMA (2.3 CM), SEE COMMENT. - INVASIVE TUMOR IS 2.8 CM FROM THE NEAREST MARGIN (DEEP). - BENIGN NIPPLE, NEGATIVE FOR TUMOR. - LOBULAR CARCINOMA IN SITU. - EIGHT LYMPH NODES, POSITIVE FOR METASTATIC MAMMARY CARCINOMA (8/17). - NO EXTRACAPSULAR TUMOR EXTENSION IDENTIFIED. - SEE TUMOR SYNOPTIC TEMPLATE BELOW. 5. Lymph nodes, regional resection, Additional Right Axillary - ONE LYMPH NODE, POSITIVE FOR ISOLATED TUMOR CELLS (0/1) SEE COMMENT Microscopic Comment 1. and 2. There are no intranodal metastatic tumor deposits identified  with cytokeratin AE1/AE3 immunostain. 3.  BREAST, IN SITU CARCINOMA Specimen, including laterality: Left breast. Procedure: Simple mastectomy. Grade of carcinoma: II (grading limited by neoadjuvant-related change). Necrosis: Absent. Estimated tumor size: (glass slide measurement): 0.3 cm. Treatment effect: Present. If present, treatment effect in breast tissue, lymph nodes or both: Breast tissue. Distance to closest margin: 1.5 cm. If margin positive, focally or broadly: N/[name]. Breast prognostic profile: Estrogen and progesterone receptor: Not repeated; previous study demonstrated 81% for estrogen and 74% for progesterone ([id number]). Lymph nodes: Examined: 3 (Parts 1, 2 and 3). Lymph nodes with metastasis: 0. TNM: ypTis, pN0. 4. BREAST, INVASIVE TUMOR, WITH LYMPH NODE SAMPLING Specimen, including laterality: Right breast. Procedure: Simple mastectomy. Grade: II of III. Tubule formation: 3. Nuclear pleomorphism: 2. Mitotic: 1. Tumor size (gross measurement): 2.3 cm. Margins: Invasive, distance to closest margin: 1.5 cm. In situ, distance to closest margin: 1.5 cm (deep). If margin positive, focally or broadly: None. Lymphovascular invasion: Absent. 2 of 5 FINAL for Mary [name], Mary [name] [name] [phone]) Microscopic Comment(continued) Ductal carcinoma in situ: None. Grade: N/[name]. Extensive intraductal component: N/[name]. Lobular neoplasia: Present, in situ carcinoma. Tumor focality: Unifocal. Treatment effect: Present. If present, treatment effect in breast tissue, lymph nodes or both: Both Extent of tumor: Skin: Negative. Nipple: Negative. Skeletal muscle: Negative. Lymph nodes: # examined: 18 (Parts 4 and 5). Lymph nodes with metastasis: 8 Isolated tumor cells (< 0.2 mm): 5 see comment Micrometastasis: (> 0.2 mm and < 2.0 mm): 0. Macrometastasis: (> 2.0 mm): 0.2 cm, 0.8 cm, 0.3 cm, 0.3 cm, 0.3 cm, 0.9 cm, and 1.3 cm. Extracapsular extension: Absent. Breast prognostic  profile: Estrogen and Progesterone receptor: Not repeated; previous study demonstrated 95% for estrogen and 53 % for progesterone ([id number]). Ki-67: Not repeated; previous study demonstrated 85% proliferation rate ([id number]). HER-2/neu: Repeated; previous study demonstrated no amplification (1.21) ([id number]). Non-neoplastic breast: Multiple complex sclerosing lesions/radial scars, fibroadenomatoid nodules, fibrocystic change, and microcalcifications in benign ducts and lobules. TNM: ypT2, pN2a, pMX. (CRR:eps 05/21/12) Comment: There are isolated tumor cells present in [name] total of five lymph nodes from part 4 (slides 4K, 4R, and 9M) and part 5 (Slide 5A). The isolated tumor cells were identified with cytokeratin AE1/3 immunostain. 5. The isolated tumor cells were identified with cytokeratin AE1/3 immunostain.    RADIOGRAPHIC STUDIES:  ASSESSMENT/PLAN: 53 year old female with  #1 stage III (T3, N1, M0) invasive lobular carcinoma of the right breast with DCIS of the left breast. Status post neoadjuvant chemotherapy initially consisting of 4 cycles of FEC 100. This was followed by single agent weekly Taxol administered from 02/02/2012 through 04/20/2012. She then underwent bilateral mastectomies on 05/18/2012. She also had right x-ray lymph node dissection and left sentinel lymph node biopsy. Her final pathology on the right revealed invasive lobular carcinoma 2.3 cm with 8 of 17 lymph nodes positive (T2 N2). 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 She is status post completion of radiation therapy from April 2014 through 08/17/2012 to the right chest wall and axilla.  #2 after completion of radiation therapy patient was begun on adjuvant tamoxifen 20 mg daily starting in April 2014. She also had suppression of ovarian function with Zoladex every month. Overall she's tolerated this well. Based on the findings of the SOFT we discussed switching her to  Aromasin. We discussed the rationale for this and benefits as well as side effects. She was given [name] prescription for Aromasin 25 mg daily. Literature was given to her as well.  #3 sexuality: Patient will be referred to  physical therapy/pelvic therapy. She is also counseled to seek couples counseling.  #4 survivorship: I have referred the patient to the finding your normal program to be offered on April 27 as well as her live strong program.  #5 I will plan on seeing the patient back in 6 months or sooner if need arises.    I spent 25 minutes counseling the patient face to face. The total time spent in the appointment was 30 minutes.  Marcy Panning, MD Medical/Oncology [facility] [phone] (beeper) [phone] (Office)

## 2013-06-12 ENCOUNTER — Ambulatory Visit (HOSPITAL_BASED_OUTPATIENT_CLINIC_OR_DEPARTMENT_OTHER): Payer: 59

## 2013-06-12 VITALS — BP 136/79 | HR 89 | Temp 98.4°F

## 2013-06-12 DIAGNOSIS — C50919 Malignant neoplasm of unspecified site of unspecified female breast: Secondary | ICD-10-CM

## 2013-06-12 DIAGNOSIS — C50912 Malignant neoplasm of unspecified site of left female breast: Secondary | ICD-10-CM

## 2013-06-12 DIAGNOSIS — Z5111 Encounter for antineoplastic chemotherapy: Secondary | ICD-10-CM

## 2013-06-12 MED ORDER — GOSERELIN ACETATE 3.6 MG ~~LOC~~ IMPL
3.6000 mg | DRUG_IMPLANT | Freq: Once | SUBCUTANEOUS | Status: AC
  Administered 2013-06-12: 3.6 mg via SUBCUTANEOUS
  Filled 2013-06-12: qty 3.6

## 2013-06-19 ENCOUNTER — Telehealth: Payer: Self-pay

## 2013-06-19 NOTE — Telephone Encounter (Signed)
Returned pt call - ok to take cold meds.  If MD needed - see PCP.  Pt voiced understanding.

## 2013-06-20 ENCOUNTER — Ambulatory Visit (INDEPENDENT_AMBULATORY_CARE_PROVIDER_SITE_OTHER): Payer: 59 | Admitting: Family Medicine

## 2013-06-20 ENCOUNTER — Telehealth: Payer: Self-pay | Admitting: Internal Medicine

## 2013-06-20 ENCOUNTER — Encounter: Payer: Self-pay | Admitting: Family Medicine

## 2013-06-20 VITALS — BP 142/82 | HR 79 | Temp 98.1°F | Wt 161.5 lb

## 2013-06-20 DIAGNOSIS — J069 Acute upper respiratory infection, unspecified: Secondary | ICD-10-CM

## 2013-06-20 MED ORDER — AMOXICILLIN-POT CLAVULANATE 875-125 MG PO TABS: 1.0000 | ORAL_TABLET | Freq: Two times a day (BID) | ORAL | Status: DC

## 2013-06-20 NOTE — Patient Instructions (Signed)
Out of work in the meantime.  Try to get some rest.   Treat it like a cold and if not better in a few days then start the antibiotics.  Take care.  Glad to see you.

## 2013-06-20 NOTE — Telephone Encounter (Signed)
Pt scheduled with Dr. Damita Dunnings today

## 2013-06-20 NOTE — Progress Notes (Signed)
Pre visit review using our clinic review tool, if applicable. No additional management support is needed unless otherwise documented below in the visit note.  3-4 days ago with sneezing and irritation in her throat.  Worse as the night went one. Chills, congested.  No fevers. Diffuse tooth pain. Sinus pressure.  Ears slightly painful.  Dec in appetite.  Fatigued.  Some cough.  No sputum. Clear rhinorrhea.  Overall not worse but not better.  Novcomiting, no diarrhea.   Mult sick contacts.    Meds, vitals, and allergies reviewed.   ROS: See HPI.  Otherwise, noncontributory.  GEN: nad, alert and oriented HEENT: mucous membranes moist, tm w/o erythema, nasal exam w/o erythema, clear discharge noted,  OP with cobblestoning, sinuses not ttp NECK: supple w/o LA CV: rrr.   PULM: ctab, no inc wob EXT: no edema SKIN: no acute rash

## 2013-06-20 NOTE — Assessment & Plan Note (Signed)
Nontoxic, okay for outpatient f/u.  Hold rx for augmentin in the meantime.  If progressive sx, facial pain, prolonged sx, then start.  She agrees.  Supportive care o/w.  F/u prn.  Routed to PCP as FYI.

## 2013-06-20 NOTE — Telephone Encounter (Signed)
Patient Information:  Caller Name: Selisa  Phone: [phone]  Patient: Mary [name], Mary [name]  Gender: Female  DOB: 1961/02/12  Age: 53 Years  PCP: Deborra Medina (Adults only)  Pregnant: No  Office Follow Up:  Does the office need to follow up with this patient?: No  Instructions For The Office: N/A  RN Note:  appt scheduled for today at [facility] at 1115 with Dr.Duncan  Symptoms  Reason For Call & Symptoms: c/o congestion and fullness; afebrile, but feels cold; c/o chills and body aches; c/o HA; says even her teeth hurt; slight cough; sxs seem to be in the head and nose; says sxs have worsened since Monday  Reviewed Health History In EMR: Yes  Reviewed Medications In EMR: Yes  Reviewed Allergies In EMR: Yes  Reviewed Surgeries / Procedures: Yes  Date of Onset of Symptoms: 06/17/2013  Treatments Tried: Claritin, Tylenol Sinus, Benadryl, Afrin  Treatments Tried Worked: No OB / GYN:  LMP: Unknown  Guideline(s) Used:  Colds  Disposition Per Guideline:   Home Care  Reason For Disposition Reached:   Colds with no complications  Advice Given:  Reassurance  Colds are very common and may make you feel uncomfortable.  Colds are usually not serious.  For a Runny Nose With Profuse Discharge:   Nasal mucus and discharge helps to wash viruses and bacteria out of the nose and sinuses.  Blowing the nose is all that is needed.  For a Stuffy Nose - Use Nasal Washes:  Introduction: Saline (salt water) nasal irrigation (nasal wash) is an effective and simple home remedy for treating stuffy nose and sinus congestion. The nose can be irrigated by pouring, spraying, or squirting salt water into the nose and then letting it run back out.  How it Helps: The salt water rinses out excess mucus, washes out any irritants (dust, allergens) that might be present, and moistens the nasal cavity.  Methods: There are several ways to perform nasal irrigation. You can use a saline nasal spray bottle  (available over-the-counter), a rubber ear syringe, a medical syringe without the needle, or a Neti Pot.  Humidifier:  If the air in your home is dry, use a cool-mist humidifier  Expected Course:   Fever may last 2-3 days  Nasal discharge 7-14 days  Cough up to 2-3 weeks.  Medicines for a Stuffy or Runny Nose:  Antihistamines: Are only helpful if you also have nasal allergies.  Nasal Decongestants for a Very Stuffy or Runny Nose:  If you have a very stuffy nose, nasal decongestant medicines can shrink the swollen nasal mucosa and allow for easier breathing. If you have a very runny nose, these medicines can reduce the amount of drainage. They may be taken as pills by mouth or as a nasal spray.  Pseudoephedrine (Sudafed) is available OTC in pill form. Typical adult dosage is two 30 mg tablets every 6 hours.  Phenylephrine (Sudafed PE) is available OTC in pill form. Typical adult dosage is two 30 mg tablets every 6 hours.  Oxymetazoline Nasal Drops (Afrin) are available OTC. Clean out the nose before using. Spray each nostril once, wait one minute for absorption, and then spray a second time.  Call Back If:  You become worse  RN Overrode Recommendation:  Make Appointment  would like to be seen  Appointment Scheduled:  06/20/2013 11:15:00 Appointment Scheduled Provider:  Other

## 2013-06-26 ENCOUNTER — Ambulatory Visit: Payer: 59 | Admitting: Physical Therapy

## 2013-06-28 ENCOUNTER — Other Ambulatory Visit: Payer: Self-pay | Admitting: *Deleted

## 2013-06-28 DIAGNOSIS — C50419 Malignant neoplasm of upper-outer quadrant of unspecified female breast: Secondary | ICD-10-CM

## 2013-06-28 DIAGNOSIS — N6310 Unspecified lump in the right breast, unspecified quadrant: Secondary | ICD-10-CM

## 2013-06-28 MED ORDER — LORAZEPAM 0.5 MG PO TABS: 0.5000 mg | ORAL_TABLET | Freq: Three times a day (TID) | ORAL | Status: DC

## 2013-07-10 ENCOUNTER — Ambulatory Visit (HOSPITAL_BASED_OUTPATIENT_CLINIC_OR_DEPARTMENT_OTHER): Payer: 59

## 2013-07-10 VITALS — BP 149/69 | HR 84 | Temp 98.6°F

## 2013-07-10 DIAGNOSIS — C50912 Malignant neoplasm of unspecified site of left female breast: Secondary | ICD-10-CM

## 2013-07-10 DIAGNOSIS — Z5111 Encounter for antineoplastic chemotherapy: Secondary | ICD-10-CM

## 2013-07-10 DIAGNOSIS — C50419 Malignant neoplasm of upper-outer quadrant of unspecified female breast: Secondary | ICD-10-CM

## 2013-07-10 MED ORDER — GOSERELIN ACETATE 3.6 MG ~~LOC~~ IMPL
3.6000 mg | DRUG_IMPLANT | Freq: Once | SUBCUTANEOUS | Status: AC
  Administered 2013-07-10: 3.6 mg via SUBCUTANEOUS
  Filled 2013-07-10: qty 3.6

## 2013-07-26 ENCOUNTER — Telehealth: Payer: Self-pay | Admitting: Adult Health

## 2013-07-26 NOTE — Telephone Encounter (Signed)
, °

## 2013-08-13 ENCOUNTER — Ambulatory Visit (HOSPITAL_BASED_OUTPATIENT_CLINIC_OR_DEPARTMENT_OTHER): Payer: 59

## 2013-08-13 ENCOUNTER — Ambulatory Visit (HOSPITAL_BASED_OUTPATIENT_CLINIC_OR_DEPARTMENT_OTHER): Payer: 59 | Admitting: Nurse Practitioner

## 2013-08-13 ENCOUNTER — Ambulatory Visit: Payer: 59 | Admitting: Adult Health

## 2013-08-13 ENCOUNTER — Other Ambulatory Visit (HOSPITAL_BASED_OUTPATIENT_CLINIC_OR_DEPARTMENT_OTHER): Payer: 59

## 2013-08-13 VITALS — BP 152/84 | HR 99 | Temp 98.7°F | Resp 18 | Ht 64.0 in | Wt 165.4 lb

## 2013-08-13 DIAGNOSIS — Z5111 Encounter for antineoplastic chemotherapy: Secondary | ICD-10-CM

## 2013-08-13 DIAGNOSIS — C50419 Malignant neoplasm of upper-outer quadrant of unspecified female breast: Secondary | ICD-10-CM

## 2013-08-13 DIAGNOSIS — C773 Secondary and unspecified malignant neoplasm of axilla and upper limb lymph nodes: Secondary | ICD-10-CM

## 2013-08-13 DIAGNOSIS — C50919 Malignant neoplasm of unspecified site of unspecified female breast: Secondary | ICD-10-CM

## 2013-08-13 DIAGNOSIS — D0590 Unspecified type of carcinoma in situ of unspecified breast: Secondary | ICD-10-CM

## 2013-08-13 DIAGNOSIS — Z170 Estrogen receptor positive status [ER+]: Secondary | ICD-10-CM

## 2013-08-13 DIAGNOSIS — C50912 Malignant neoplasm of unspecified site of left female breast: Secondary | ICD-10-CM

## 2013-08-13 LAB — CBC WITH DIFFERENTIAL/PLATELET
BASO%: 0.2 % (ref 0.0–2.0)
BASOS ABS: 0 10*3/uL (ref 0.0–0.1)
EOS%: 1 % (ref 0.0–7.0)
Eosinophils Absolute: 0.1 10*3/uL (ref 0.0–0.5)
HCT: 40.7 % (ref 34.8–46.6)
HEMOGLOBIN: 13.5 g/dL (ref 11.6–15.9)
LYMPH#: 1 10*3/uL (ref 0.9–3.3)
LYMPH%: 11.3 % — AB (ref 14.0–49.7)
MCH: 31.2 pg (ref 25.1–34.0)
MCHC: 33.2 g/dL (ref 31.5–36.0)
MCV: 94 fL (ref 79.5–101.0)
MONO#: 0.4 10*3/uL (ref 0.1–0.9)
MONO%: 4.9 % (ref 0.0–14.0)
NEUT#: 6.9 10*3/uL — ABNORMAL HIGH (ref 1.5–6.5)
NEUT%: 82.6 % — ABNORMAL HIGH (ref 38.4–76.8)
PLATELETS: 271 10*3/uL (ref 145–400)
RBC: 4.33 10*6/uL (ref 3.70–5.45)
RDW: 12.5 % (ref 11.2–14.5)
WBC: 8.4 10*3/uL (ref 3.9–10.3)

## 2013-08-13 LAB — COMPREHENSIVE METABOLIC PANEL (CC13)
ALT: 25 U/L (ref 0–55)
ANION GAP: 11 meq/L (ref 3–11)
AST: 16 U/L (ref 5–34)
Albumin: 3.5 g/dL (ref 3.5–5.0)
Alkaline Phosphatase: 98 U/L (ref 40–150)
BILIRUBIN TOTAL: 0.23 mg/dL (ref 0.20–1.20)
BUN: 13.1 mg/dL (ref 7.0–26.0)
CO2: 26 mEq/L (ref 22–29)
CREATININE: 0.8 mg/dL (ref 0.6–1.1)
Calcium: 9.4 mg/dL (ref 8.4–10.4)
Chloride: 107 mEq/L (ref 98–109)
Glucose: 160 mg/dl — ABNORMAL HIGH (ref 70–140)
Potassium: 3.8 mEq/L (ref 3.5–5.1)
Sodium: 143 mEq/L (ref 136–145)
Total Protein: 7.2 g/dL (ref 6.4–8.3)

## 2013-08-13 MED ORDER — GOSERELIN ACETATE 3.6 MG ~~LOC~~ IMPL
3.6000 mg | DRUG_IMPLANT | Freq: Once | SUBCUTANEOUS | Status: AC
  Administered 2013-08-13: 3.6 mg via SUBCUTANEOUS
  Filled 2013-08-13: qty 3.6

## 2013-08-13 NOTE — Patient Instructions (Signed)
Goserelin injection What is this medicine? GOSERELIN (GOE se rel in) is similar to a hormone found in the body. It lowers the amount of sex hormones that the body makes. Men will have lower testosterone levels and women will have lower estrogen levels while taking this medicine. In men, this medicine is used to treat prostate cancer; the injection is either given once per month or once every 12 weeks. A once per month injection (only) is used to treat women with endometriosis, dysfunctional uterine bleeding, or advanced breast cancer. This medicine may be used for other purposes; ask your health care provider or pharmacist if you have questions. COMMON BRAND NAME(S): Zoladex What should I tell my health care provider before I take this medicine? They need to know if you have any of these conditions (some only apply to women): -diabetes -heart disease or previous heart attack -high blood pressure -high cholesterol -kidney disease -osteoporosis or low bone density -problems passing urine -spinal cord injury -stroke -tobacco smoker -an unusual or allergic reaction to goserelin, hormone therapy, other medicines, foods, dyes, or preservatives -pregnant or trying to get pregnant -breast-feeding How should I use this medicine? This medicine is for injection under the skin. It is given by a health care professional in a hospital or clinic setting. Men receive this injection once every 4 weeks or once every 12 weeks. Women will only receive the once every 4 weeks injection. Talk to your pediatrician regarding the use of this medicine in children. Special care may be needed. Overdosage: If you think you have taken too much of this medicine contact a poison control center or emergency room at once. NOTE: This medicine is only for you. Do not share this medicine with others. What if I miss a dose? It is important not to miss your dose. Call your doctor or health care professional if you are unable to  keep an appointment. What may interact with this medicine? -female hormones like estrogen -herbal or dietary supplements like black cohosh, chasteberry, or DHEA -female hormones like testosterone -prasterone This list may not describe all possible interactions. Give your health care provider a list of all the medicines, herbs, non-prescription drugs, or dietary supplements you use. Also tell them if you smoke, drink alcohol, or use illegal drugs. Some items may interact with your medicine. What should I watch for while using this medicine? Visit your doctor or health care professional for regular checks on your progress. Your symptoms may appear to get worse during the first weeks of this therapy. Tell your doctor or healthcare professional if your symptoms do not start to get better or if they get worse after this time. Your bones may get weaker if you take this medicine for a long time. If you smoke or frequently drink alcohol you may increase your risk of bone loss. A family history of osteoporosis, chronic use of drugs for seizures (convulsions), or corticosteroids can also increase your risk of bone loss. Talk to your doctor about how to keep your bones strong. This medicine should stop regular monthly menstration in women. Tell your doctor if you continue to menstrate. Women should not become pregnant while taking this medicine or for 12 weeks after stopping this medicine. Women should inform their doctor if they wish to become pregnant or think they might be pregnant. There is a potential for serious side effects to an unborn child. Talk to your health care professional or pharmacist for more information. Do not breast-feed an infant while taking   this medicine. Men should inform their doctors if they wish to father a child. This medicine may lower sperm counts. Talk to your health care professional or pharmacist for more information. What side effects may I notice from receiving this  medicine? Side effects that you should report to your doctor or health care professional as soon as possible: -allergic reactions like skin rash, itching or hives, swelling of the face, lips, or tongue -bone pain -breathing problems -changes in vision -chest pain -feeling faint or lightheaded, falls -fever, chills -pain, swelling, warmth in the leg -pain, tingling, numbness in the hands or feet -swelling of the ankles, feet, hands -trouble passing urine or change in the amount of urine -unusually high or low blood pressure -unusually weak or tired Side effects that usually do not require medical attention (report to your doctor or health care professional if they continue or are bothersome): -change in sex drive or performance -changes in breast size in both males and females -changes in emotions or moods -headache -hot flashes -irritation at site where injected -loss of appetite -skin problems like acne, dry skin -vaginal dryness This list may not describe all possible side effects. Call your doctor for medical advice about side effects. You may report side effects to FDA at 1-800-FDA-1088. Where should I keep my medicine? This drug is given in a hospital or clinic and will not be stored at home. NOTE: This sheet is a summary. It may not cover all possible information. If you have questions about this medicine, talk to your doctor, pharmacist, or health care provider.  2014, Elsevier/Gold Standard. (2008-07-22 13:28:29)  

## 2013-08-13 NOTE — Progress Notes (Signed)
  [facility] OFFICE PROGRESS NOTE   Diagnosis:  Breast cancer.  INTERVAL HISTORY:   Ms. Kass returns as scheduled. 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 She is currently on Aromasin and receives monthly Zoladex injections.  She continues to have hot flashes. No significant joint pain. She denies vaginal bleeding. No change over the chest wall. She denies pain. No shortness of breath. No leg swelling or calf pain. She denies bowel or bladder problems. She reports a recent sore throat and cough accompanied by fever and aches.  Objective:  Vital signs in last 24 hours:  Blood pressure 152/84, pulse 99, temperature 98.7 F (37.1 C), temperature source Oral, resp. rate 18, height $RemoveBe'5\' 4"'jIrOMgqPa$  (1.626 m), weight 165 lb 6.4 oz (75.025 kg).    HEENT: No thrush or ulcerations. Lymphatics: No palpable cervical, supraclavicular or axillary lymph nodes. Resp: Lungs clear. Cardio: Regular cardiac rhythm. GI: Abdomen soft and nontender. No organomegaly. Vascular: No leg edema. Calves nontender.  Breasts: Status post bilateral mastectomy. No evidence of chest wall recurrence.    Lab Results:  Lab Results  Component Value Date   WBC 8.4 08/13/2013   HGB 13.5 08/13/2013   HCT 40.7 08/13/2013   MCV 94.0 08/13/2013   PLT 271 08/13/2013   NEUTROABS 6.9* 08/13/2013    Imaging:  No results found.  Medications: I have reviewed the patient's current medications.  Assessment/Plan: 1. Stage IIIa (T2 N2) right-sided breast cancer initially diagnosed August 2013; ER/PR positive, HER-2 negative. status post neoadjuvant FEC chemotherapy for 4 cycles 12/08/2011 through 01/19/2012 followed by Taxol weekly x12 02/02/2012 through 04/20/2012. Status post bilateral mastectomy 05/18/2012. Status post radiation 07/03/2012 through 08/17/2012. Tamoxifen initiated June 2014; switched to [facility] March 2015.  Monthly Zoladex initiated 06/04/2012. 2. DCIS left breast initially diagnosed August 2013; ER/PR positive. Status post bilateral mastectomy 05/18/2012.   Disposition: Ms. Austad appears stable. She remains in clinical remission from breast cancer. She will continue Aromasin and monthly Zoladex. She will be seen in followup in 2 months. She understands to contact the office in the interim with any questions or concerns.  Plan reviewed with Dr. Benay Spice.    Owens Shark ANP/GNP-BC   08/13/2013  11:55 AM

## 2013-08-15 ENCOUNTER — Encounter: Payer: Self-pay | Admitting: Internal Medicine

## 2013-08-15 ENCOUNTER — Ambulatory Visit (INDEPENDENT_AMBULATORY_CARE_PROVIDER_SITE_OTHER): Payer: 59 | Admitting: Internal Medicine

## 2013-08-15 VITALS — BP 146/88 | HR 94 | Temp 98.8°F | Resp 18 | Ht 64.0 in | Wt 164.8 lb

## 2013-08-15 DIAGNOSIS — J020 Streptococcal pharyngitis: Secondary | ICD-10-CM

## 2013-08-15 DIAGNOSIS — I10 Essential (primary) hypertension: Secondary | ICD-10-CM

## 2013-08-15 DIAGNOSIS — N951 Menopausal and female climacteric states: Secondary | ICD-10-CM

## 2013-08-15 DIAGNOSIS — J069 Acute upper respiratory infection, unspecified: Secondary | ICD-10-CM

## 2013-08-15 LAB — POCT RAPID STREP A (OFFICE): Rapid Strep A Screen: NEGATIVE

## 2013-08-15 MED ORDER — HYDROCOD POLST-CHLORPHEN POLST 10-8 MG/5ML PO LQCR: 10.0000 mL | Freq: Every evening | ORAL | Status: DC | PRN

## 2013-08-15 MED ORDER — VENLAFAXINE HCL 37.5 MG PO TABS
37.5000 mg | ORAL_TABLET | Freq: Two times a day (BID) | ORAL | Status: DC
Start: 2013-08-15 — End: 2014-02-04

## 2013-08-15 MED ORDER — PREDNISONE (PAK) 10 MG PO TABS: ORAL_TABLET | ORAL | Status: DC

## 2013-08-15 MED ORDER — LEVOFLOXACIN 500 MG PO TABS
500.0000 mg | ORAL_TABLET | Freq: Every day | ORAL | Status: DC
Start: 2013-08-15 — End: 2013-10-28

## 2013-08-15 MED ORDER — BENZONATATE 200 MG PO CAPS: 200.0000 mg | ORAL_CAPSULE | Freq: Three times a day (TID) | ORAL | Status: DC | PRN

## 2013-08-15 NOTE — Progress Notes (Signed)
**Note Mary-Identified via Obfuscation** Patient ID: Mary [name], female   DOB: [birth year], 53 y.o.   MRN: [id number] Patient Active Problem List   Diagnosis Date Noted  . Hot flashes, menopausal 08/08/2012  . Neutropenia, drug-induced 12/15/2011  . Cancer of upper-outer quadrant of female breast 11/17/2011  . Breast cancer 11/17/2011  . Hypertension 09/23/2011  . Mass of breast, right 09/23/2011  . URI (upper respiratory infection) 09/23/2011  . Diarrhea of presumed infectious origin 05/22/2011  . Plantar fasciitis   . PLANTAR FASCIITIS, LEFT 12/01/2009  . FOOT PAIN, LEFT 12/01/2009  . UNEQUAL LEG LENGTH 12/01/2009    Subjective:  CC:   Chief Complaint  Patient presents with  . Acute Visit    sore throat X 12 days, ears feel stopped up , fever 8 days ago of 101.  . Sore Throat    HPI:   Mary [name] is a 53 y.o. female who presents for 1)  2 week history of sore throat,  Ear pain  Congestion,  No relief with OTC analgesics .  No history of allergies.  Woke up with bad sore throat and fever two weeks ago with fevers for two days to 101 .  Now resolved but still having a non productive cough,  And sinus drainage , occasionally green,  No appetite.,  Loss of smell and taste.   Other issues,  Patient has not been seen since 2013 after being diagnosed with BRCA  2) irritability.  Husband has noticed change in demeanor. No prior trial of SSRI  3) BRCA:  In clinical remission for : DCIS left breast ,  Stage IIIa left breast , diagnosed August 2013. S/p chemo, XRT and  Eventual bilateral mastectomy, now on Aromasin  and chemo.  Hair has grown back,  She is feeling generally better .  4) Overweight: weight has been unaffected by cancer treatment. Still walking on a regular basis,  Diet is not particularly goal oriented but healthy   Past Medical History  Diagnosis Date  . Irritable bowel syndrome   . Dental crowns present     x 4  . Neutropenia, drug-induced 12/15/2011  . Breast cancer August 2013   bilateral, er/pr+  . Complication of anesthesia     pt states she had the shakes extremely bad  . Cough     at night d/t reflux;nothing productive  . History of bronchitis     08/2011  . GERD (gastroesophageal reflux disease)     takes Nexium daily  . IBS (irritable bowel syndrome)     immodium prn  . History of colon polyps   . Urinary frequency   . Nocturia   . Insomnia     takes Ambien nightly  . Anxiety     takes Xanax prn  . Plantar fasciitis   . Hot flashes, menopausal 08/08/2012  . Status post chemotherapy     FEC 100 starting on 12/08/2011 - 01/18/12/single agent Taxol x12 weeks from 02/02/2012 through January 2014   . S/P radiation therapy 07/03/2012 through 08/18/2038                                                         Right chest wall/regional lymph nodes 5040 cGy 28 sessions, right chest wall/mastectomy scar boost 1000 cGy 5 sessions                          .  Use of tamoxifen (Nolvadex)     Past Surgical History  Procedure Laterality Date  . Portacath placement  12/02/2011    Procedure: INSERTION PORT-A-CATH;  Surgeon: Adin Hector, MD;  Location: [facility];  Service: General;  Laterality: N/A;  insertion port a cath with fluoroscopy  . Breast lumpectomy  2008    left  . Colonoscopy    . Mastectomy with axillary lymph node dissection Right 05/18/2012    Procedure: MASTECTOMY WITH AXILLARY LYMPH NODE DISSECTION;  Surgeon: Adin Hector, MD;  Location: [place] [facility];  Service: General;  Laterality: Right;  Bilateral Total Mastectomy , right axillary lymph node dissection left axillary sentinel node biopsy, removal of Porta Cath  . Simple mastectomy with axillary sentinel node biopsy Left 05/18/2012    Procedure: SIMPLE MASTECTOMY WITH AXILLARY SENTINEL NODE BIOPSY;  Surgeon: Adin Hector, MD;  Location: [facility];  Service: General;  Laterality: Left;  . Port-a-cath removal N/A 05/18/2012    Procedure: REMOVAL PORT-A-CATH;  Surgeon: Adin Hector, MD;   Location: [facility];  Service: General;  Laterality: N/A;       The following portions of the patient's history were reviewed and updated as appropriate: Allergies, current medications, and problem list.    Review of Systems:   Patient denies headache, fevers, malaise, unintentional weight loss, skin rash, eye pain, sinus congestion and sinus pain, sore throat, dysphagia,  hemoptysis , cough, dyspnea, wheezing, chest pain, palpitations, orthopnea, edema, abdominal pain, nausea, melena, diarrhea, constipation, flank pain, dysuria, hematuria, urinary  Frequency, nocturia, numbness, tingling, seizures,  Focal weakness, Loss of consciousness,  Tremor, insomnia, depression, anxiety, and suicidal ideation.     History   Social History  . Marital Status: Married    Spouse Name: N/A    Number of Children: N/A  . Years of Education: N/A   Occupational History  . [employer]   Social History Main Topics  . Smoking status: Former Smoker -- 0.50 packs/day    Quit date: 03/21/1997  . Smokeless tobacco: Never Used  . Alcohol Use: 0.0 oz/week     Comment: occasionally wine  . Drug Use: No  . Sexual Activity: Yes    Birth Control/ Protection: None   Other Topics Concern  . Not on file   Social History Narrative   Married to [employer]  No children 36 years old with fmp.  20 year use of birth control pills.      Objective:  Filed Vitals:   08/15/13 0922  BP: 146/88  Pulse: 94  Temp: 98.8 F (37.1 C)  Resp: 18     General appearance: alert, cooperative and appears stated age Ears: normal TM's and external ear canals both ears Throat: lips, mucosa, and tongue normal; teeth and gums normal Neck: no adenopathy, no carotid bruit, supple, symmetrical, trachea midline and thyroid not enlarged, symmetric, no tenderness/mass/nodules Back: symmetric, no curvature. ROM normal. No CVA tenderness. Lungs: clear to auscultation bilaterally Heart: regular rate and rhythm, S1,  S2 normal, no murmur, click, rub or gallop Abdomen: soft, non-tender; bowel sounds normal; no masses,  no organomegaly Pulses: 2+ and symmetric Skin: Skin color, texture, turgor normal. No rashes or lesions Lymph nodes: Cervical, supraclavicular, and axillary nodes normal.  Assessment and Plan:  URI (upper respiratory infection) Strep test is negative but symptoms have persisted x  2 weeks.  Empiric augmentin, probiotic, saline lavage, benadryl  Hot flashes, menopausal Aggravated by aromasin.  Agree with trial of effexor  Hypertension New diagnosis.  Will initiate trial of hctz   A total of 25 minutes of face to face time was spent with patient more than half of which was spent in counselling and coordination of care  Lab Results  Component Value Date   CREATININE 0.8 08/13/2013    Lab Results  Component Value Date   NA 143 08/13/2013   K 3.8 08/13/2013   CL 106 08/20/2012   CO2 26 08/13/2013      Updated Medication List Outpatient Encounter Prescriptions as of 08/15/2013  Medication Sig  . ALPRAZolam (XANAX) 0.25 MG tablet TAKE ONE TABLET BY MOUTH TWICE DAILY AS NEEDED FOR ANXIETY  . Ascorbic Acid (VITAMIN C) 100 MG tablet Take 100 mg by mouth daily.    . calcium carbonate (OS-CAL) 600 MG TABS tablet Take 600 mg by mouth 2 (two) times daily with a meal.  . Calcium Carbonate-Vitamin D (CALTRATE 600+D PO) Take 1 tablet by mouth daily.  . cholecalciferol (VITAMIN D) 400 UNITS TABS Take 1,000 Units by mouth daily.   . esomeprazole (NEXIUM) 40 MG capsule Take 40 mg by mouth daily before breakfast.  . exemestane (AROMASIN) 25 MG tablet Take 1 tablet (25 mg total) by mouth daily after breakfast.  . goserelin (ZOLADEX) 3.6 MG injection Inject 3.6 mg into the skin every 28 (twenty-eight) days.  . loperamide (IMODIUM A-D) 2 MG tablet Take 2 mg by mouth 4 (four) times daily as needed for diarrhea or loose stools.   . LORazepam (ATIVAN) 0.5 MG tablet Take 1 tablet (0.5 mg total) by mouth  every 8 (eight) hours.  . Multiple Vitamin (MULTIVITAMIN) tablet Take 1 tablet by mouth daily.    . vitamin B-12 (CYANOCOBALAMIN) 1000 MCG tablet Take 1,000 mcg by mouth daily.  . benzonatate (TESSALON) 200 MG capsule Take 1 capsule (200 mg total) by mouth 3 (three) times daily as needed for cough.  . chlorpheniramine-HYDROcodone (TUSSIONEX) 10-8 MG/5ML LQCR Take 10 mLs by mouth at bedtime as needed for cough.  . hydrochlorothiazide (HYDRODIURIL) 25 MG tablet Take 1 tablet (25 mg total) by mouth daily.  . levofloxacin (LEVAQUIN) 500 MG tablet Take 1 tablet (500 mg total) by mouth daily.  . predniSONE (STERAPRED UNI-PAK) 10 MG tablet 6 tablets on Day 1 , then reduce by 1 tablet daily until gone  . venlafaxine (EFFEXOR) 37.5 MG tablet Take 1 tablet (37.5 mg total) by mouth 2 (two) times daily.     Orders Placed This Encounter  Procedures  . POCT rapid strep A    No Follow-up on file.       

## 2013-08-15 NOTE — Patient Instructions (Signed)
You have an early ear infection   .  I am prescribing an antibiotic (levaquin ) and a prednisone taper  To manage the infection and the inflammation in your ear/sinuses.   I also advise use of the following OTC meds to help with your other symptoms.   Take generic OTC benadryl 25 mg at bedtime for the post nasal drip  Sudafed PE  10 to 30 mg every 8 hours for the congestion, you may substitute Afrin nasal spray for the nighttime dose of sudafed PE  If needed to prevent insomnia.  flushes your sinuses twice daily with Simply Saline (do over the sink because if you do it right you will spit out globs of mucus)  Use benzonatate capsules   FOR THE COUGH.  You can fill the tussionex for nighttime cough if needed   Please take a probiotic ( Align, Floraque or Culturelle) while you are on the antibiotic to prevent a serious antibiotic associated diarrhea  Called clostirudium dificile colitis and a vaginal yeast infection     I agree with trying generic effexor to help you manage your mood.  Start with 1 tablet two times dailu  E mail me in 2 weeks about how it is affecting you

## 2013-08-15 NOTE — Progress Notes (Signed)
Pre-visit discussion using our clinic review tool. No additional management support is needed unless otherwise documented below in the visit note.  

## 2013-08-16 ENCOUNTER — Telehealth: Payer: Self-pay | Admitting: Hematology and Oncology

## 2013-08-16 NOTE — Telephone Encounter (Signed)
PER 5/26 POF F/U W/KK OR CP2 7/22. S/W LT RE KK NOT RETURNING AND PER LT APPT SCHEDULED FOR 8/10 W/VG. INJ APPTS REMAIN AS SCHEDULED. LMONVM FOR PT CONFIRMING EACH INJ APPT DATE/TIME AND GAVE NEW APPT FOR LB/VG 8/10. PT INFORMED THAT SHE WILL BE SEEING VG AND SCHEDULE MAILED.

## 2013-08-18 ENCOUNTER — Telehealth: Payer: Self-pay | Admitting: Internal Medicine

## 2013-08-18 MED ORDER — HYDROCHLOROTHIAZIDE 25 MG PO TABS: 25.0000 mg | ORAL_TABLET | Freq: Every day | ORAL | Status: DC

## 2013-08-18 NOTE — Assessment & Plan Note (Signed)
Aggravated by aromasin.  Agree with trial of effexor

## 2013-08-18 NOTE — Assessment & Plan Note (Addendum)
New diagnosis.  Will initiate trial of hctz  Lab Results  Component Value Date   CREATININE 0.8 08/13/2013    Lab Results  Component Value Date   NA 143 08/13/2013   K 3.8 08/13/2013   CL 106 08/20/2012   CO2 26 08/13/2013

## 2013-08-18 NOTE — Assessment & Plan Note (Signed)
Strep test is negative but symptoms have persisted x  2 weeks.  Empiric augmentin, probiotic, saline lavage, benadryl

## 2013-09-11 ENCOUNTER — Ambulatory Visit (HOSPITAL_BASED_OUTPATIENT_CLINIC_OR_DEPARTMENT_OTHER): Payer: 59

## 2013-09-11 VITALS — BP 153/84 | HR 81 | Temp 98.3°F

## 2013-09-11 DIAGNOSIS — Z5111 Encounter for antineoplastic chemotherapy: Secondary | ICD-10-CM

## 2013-09-11 DIAGNOSIS — C773 Secondary and unspecified malignant neoplasm of axilla and upper limb lymph nodes: Secondary | ICD-10-CM

## 2013-09-11 DIAGNOSIS — C50912 Malignant neoplasm of unspecified site of left female breast: Secondary | ICD-10-CM

## 2013-09-11 DIAGNOSIS — C50419 Malignant neoplasm of upper-outer quadrant of unspecified female breast: Secondary | ICD-10-CM

## 2013-09-11 MED ORDER — GOSERELIN ACETATE 3.6 MG ~~LOC~~ IMPL
3.6000 mg | DRUG_IMPLANT | Freq: Once | SUBCUTANEOUS | Status: AC
  Administered 2013-09-11: 3.6 mg via SUBCUTANEOUS
  Filled 2013-09-11: qty 3.6

## 2013-09-26 ENCOUNTER — Encounter: Payer: Self-pay | Admitting: Internal Medicine

## 2013-09-29 ENCOUNTER — Other Ambulatory Visit: Payer: Self-pay | Admitting: Oncology

## 2013-10-01 ENCOUNTER — Other Ambulatory Visit: Payer: Self-pay | Admitting: Oncology

## 2013-10-09 ENCOUNTER — Ambulatory Visit (HOSPITAL_BASED_OUTPATIENT_CLINIC_OR_DEPARTMENT_OTHER): Payer: 59

## 2013-10-09 VITALS — BP 154/76 | HR 82 | Temp 98.5°F

## 2013-10-09 DIAGNOSIS — C50912 Malignant neoplasm of unspecified site of left female breast: Secondary | ICD-10-CM

## 2013-10-09 DIAGNOSIS — Z5111 Encounter for antineoplastic chemotherapy: Secondary | ICD-10-CM

## 2013-10-09 DIAGNOSIS — C773 Secondary and unspecified malignant neoplasm of axilla and upper limb lymph nodes: Secondary | ICD-10-CM

## 2013-10-09 DIAGNOSIS — C50419 Malignant neoplasm of upper-outer quadrant of unspecified female breast: Secondary | ICD-10-CM

## 2013-10-09 MED ORDER — GOSERELIN ACETATE 3.6 MG ~~LOC~~ IMPL
3.6000 mg | DRUG_IMPLANT | Freq: Once | SUBCUTANEOUS | Status: AC
  Administered 2013-10-09: 3.6 mg via SUBCUTANEOUS
  Filled 2013-10-09: qty 3.6

## 2013-10-28 ENCOUNTER — Other Ambulatory Visit (HOSPITAL_BASED_OUTPATIENT_CLINIC_OR_DEPARTMENT_OTHER): Payer: 59

## 2013-10-28 ENCOUNTER — Encounter: Payer: Self-pay | Admitting: Hematology and Oncology

## 2013-10-28 ENCOUNTER — Ambulatory Visit (HOSPITAL_BASED_OUTPATIENT_CLINIC_OR_DEPARTMENT_OTHER): Payer: 59 | Admitting: Hematology and Oncology

## 2013-10-28 VITALS — BP 139/80 | HR 75 | Temp 98.3°F | Resp 20 | Ht 64.0 in | Wt 169.8 lb

## 2013-10-28 DIAGNOSIS — C773 Secondary and unspecified malignant neoplasm of axilla and upper limb lymph nodes: Secondary | ICD-10-CM

## 2013-10-28 DIAGNOSIS — C50919 Malignant neoplasm of unspecified site of unspecified female breast: Secondary | ICD-10-CM

## 2013-10-28 DIAGNOSIS — D0590 Unspecified type of carcinoma in situ of unspecified breast: Secondary | ICD-10-CM

## 2013-10-28 DIAGNOSIS — C50419 Malignant neoplasm of upper-outer quadrant of unspecified female breast: Secondary | ICD-10-CM

## 2013-10-28 DIAGNOSIS — Z170 Estrogen receptor positive status [ER+]: Secondary | ICD-10-CM

## 2013-10-28 DIAGNOSIS — N951 Menopausal and female climacteric states: Secondary | ICD-10-CM

## 2013-10-28 DIAGNOSIS — C50412 Malignant neoplasm of upper-outer quadrant of left female breast: Secondary | ICD-10-CM

## 2013-10-28 LAB — CBC WITH DIFFERENTIAL/PLATELET
BASO%: 0.6 % (ref 0.0–2.0)
Basophils Absolute: 0 10*3/uL (ref 0.0–0.1)
EOS%: 1.9 % (ref 0.0–7.0)
Eosinophils Absolute: 0.1 10*3/uL (ref 0.0–0.5)
HEMATOCRIT: 41.7 % (ref 34.8–46.6)
HGB: 14 g/dL (ref 11.6–15.9)
LYMPH#: 1.7 10*3/uL (ref 0.9–3.3)
LYMPH%: 25.9 % (ref 14.0–49.7)
MCH: 32.1 pg (ref 25.1–34.0)
MCHC: 33.6 g/dL (ref 31.5–36.0)
MCV: 95.6 fL (ref 79.5–101.0)
MONO#: 0.5 10*3/uL (ref 0.1–0.9)
MONO%: 7.2 % (ref 0.0–14.0)
NEUT#: 4.1 10*3/uL (ref 1.5–6.5)
NEUT%: 64.4 % (ref 38.4–76.8)
Platelets: 221 10*3/uL (ref 145–400)
RBC: 4.36 10*6/uL (ref 3.70–5.45)
RDW: 13.3 % (ref 11.2–14.5)
WBC: 6.4 10*3/uL (ref 3.9–10.3)

## 2013-10-28 LAB — COMPREHENSIVE METABOLIC PANEL
ALBUMIN: 4.5 g/dL (ref 3.5–5.2)
ALT: 23 U/L (ref 0–35)
AST: 20 U/L (ref 0–37)
Alkaline Phosphatase: 81 U/L (ref 39–117)
BUN: 16 mg/dL (ref 6–23)
CHLORIDE: 101 meq/L (ref 96–112)
CO2: 29 meq/L (ref 19–32)
Calcium: 10 mg/dL (ref 8.4–10.5)
Creatinine, Ser: 0.75 mg/dL (ref 0.50–1.10)
Glucose, Bld: 104 mg/dL — ABNORMAL HIGH (ref 70–99)
POTASSIUM: 3.7 meq/L (ref 3.5–5.3)
Sodium: 140 mEq/L (ref 135–145)
TOTAL PROTEIN: 7 g/dL (ref 6.0–8.3)
Total Bilirubin: 0.4 mg/dL (ref 0.2–1.2)

## 2013-10-28 NOTE — Assessment & Plan Note (Addendum)
Continue with hormonal therapy. Monitoring for toxicities. Recommended DEXA scan in 6 months. Followup in 6 months after DEXA scan. When she returns in 6 months, she will get FSH, estradiol, CBC and CMP. If she is menopausal, we can consider discontinuing Zoladex. Recent results from SOFT trial do not show much benefit to AI+Zolodex.

## 2013-10-28 NOTE — Progress Notes (Signed)
Patient Care Team: [name], MD as PCP - General (Internal Medicine)  DIAGNOSIS: Cancer of upper-outer quadrant of female breast   Primary site: Breast (Right)   Staging method: AJCC 7th Edition   Clinical: Stage IIIA (T3, N1, cM0) signed by [name], MD on 11/23/2011  4:23 PM   Summary: Stage IIIA (T3, N1, cM0)   SUMMARY OF ONCOLOGIC HISTORY:   Cancer of upper-outer quadrant of female breast   11/17/2011 Initial Diagnosis Cancer of upper-outer quadrant of female breast diagnosed when pt presented with Nipple flattening and palpable lesion.   12/08/2011 - 03/29/2012 Chemotherapy Neo adjuvant FEC X 4 followed by Weekly Taxol X 12   05/18/2012 Surgery Bilateral Mastectomies: Right revealed invasive  Lobular carcinoma measuring 2.3 cm 8 of 17 lymph nodes were positive (T2 N2A). 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   07/04/2012 - 08/17/2012 Radiation Therapy Rt Breast irradiation   08/19/2012 -  Anti-estrogen oral therapy Tamoxifen 20 mg daily + Zolodex Monthly    CHIEF COMPLIANT:  Chief Complaint  Patient presents with  . Follow-up of breast cancer     INTERVAL HISTORY: [name] is here for routine followup breast cancer pupils no new problems or concerns her major complaint is monthly for Zoladex injections she does have occasional hot flashes and night sweats she denies any problems taking the medication she is able to manage the hot flashes fairly well. She has had routine checkups from her primary care for GYN exams she has not had any bone density testing done so far she feels that she has good energy levels he is able to exercise frequently and is eating a healthy diet.   REVIEW OF SYSTEMS:   Constitutional: Denies fevers, chills or abnormal weight loss; occasional night sweats positive Eyes: Denies blurriness of vision Ears, nose, mouth, throat, and face: Denies mucositis or sore throat Respiratory: Denies cough, dyspnea or  wheezes Cardiovascular: Denies palpitation, chest discomfort or lower extremity swelling Gastrointestinal:  Denies nausea, heartburn or change in bowel habits Skin: Denies abnormal skin rashes Lymphatics: Denies new lymphadenopathy or easy bruising Neurological:Denies numbness, tingling or new weaknesses Behavioral/Psych: Mood is stable, no new changes  All other systems were reviewed with the patient and are negative.  I have reviewed the past medical history, past surgical history, social history and family history with the patient and they are unchanged from previous note.  ALLERGIES:  is allergic to adhesive.  MEDICATIONS:  Current Outpatient Prescriptions  Medication Sig Dispense Refill  . ALPRAZolam (XANAX) 0.25 MG tablet TAKE ONE TABLET BY MOUTH TWICE DAILY AS NEEDED FOR ANXIETY  60 tablet  4  . Ascorbic Acid (VITAMIN C) 100 MG tablet Take 100 mg by mouth daily.        . calcium carbonate (OS-CAL) 600 MG TABS tablet Take 600 mg by mouth 2 (two) times daily with a meal.      . Calcium Carbonate-Vitamin D (CALTRATE 600+D PO) Take 1 tablet by mouth daily.      . cholecalciferol (VITAMIN D) 400 UNITS TABS Take 1,000 Units by mouth daily.       Marland Kitchen esomeprazole (NEXIUM) 40 MG capsule Take 40 mg by mouth daily before breakfast.      . exemestane (AROMASIN) 25 MG tablet Take 1 tablet (25 mg total) by mouth daily after breakfast.  30 tablet  12  . goserelin (ZOLADEX) 3.6 MG injection Inject 3.6 mg into the skin every 28 (  twenty-eight) days.      . hydrochlorothiazide (HYDRODIURIL) 25 MG tablet Take 1 tablet (25 mg total) by mouth daily.  90 tablet  3  . loperamide (IMODIUM A-D) 2 MG tablet Take 2 mg by mouth 4 (four) times daily as needed for diarrhea or loose stools.       Marland Kitchen LORazepam (ATIVAN) 0.5 MG tablet TAKE ONE TABLET BY MOUTH EVERY 8 HOURS  60 tablet  0  . Multiple Vitamin (MULTIVITAMIN) tablet Take 1 tablet by mouth daily.        Marland Kitchen venlafaxine (EFFEXOR) 37.5 MG tablet Take 1 tablet  (37.5 mg total) by mouth 2 (two) times daily.  60 tablet  2   No current facility-administered medications for this visit.    PHYSICAL EXAMINATION: ECOG PERFORMANCE STATUS: 0 - Asymptomatic  Filed Vitals:   10/28/13 1435  BP: 139/80  Pulse: 75  Temp: 98.3 F (36.8 C)  Resp: 20   Filed Weights   10/28/13 1435  Weight: 169 lb 12.8 oz (77.021 kg)     Physical Exam General: Patient is a well appearing female in no acute distress  HEENT: PERRLA, sclerae anicteric no conjunctival pallor, MMM  Neck: supple, no palpable adenopathy  Lungs: clear to auscultation bilaterally, no wheezes, rhonchi, or rales  Cardiovascular: regular rate rhythm, S1, S2, no murmurs, rubs or gallops  Abdomen: Soft, non-tender, non-distended, normoactive bowel sounds, no HSM  Extremities: warm and well perfused, no clubbing, cyanosis, or edema  Skin: No lesions or rash  Neuro: Non-focal  Breast:status post bilateral mastectomies no evidence of local recurrence   LABORATORY DATA:  I have reviewed the data as listed Appointment on 10/28/2013  Component Date Value Ref Range Status  . WBC 10/28/2013 6.4  3.9 - 10.3 10e3/uL Final  . NEUT# 10/28/2013 4.1  1.5 - 6.5 10e3/uL Final  . HGB 10/28/2013 14.0  11.6 - 15.9 g/dL Final  . HCT 10/28/2013 41.7  34.8 - 46.6 % Final  . Platelets 10/28/2013 221  145 - 400 10e3/uL Final  . MCV 10/28/2013 95.6  79.5 - 101.0 fL Final  . MCH 10/28/2013 32.1  25.1 - 34.0 pg Final  . MCHC 10/28/2013 33.6  31.5 - 36.0 g/dL Final  . RBC 10/28/2013 4.36  3.70 - 5.45 10e6/uL Final  . RDW 10/28/2013 13.3  11.2 - 14.5 % Final  . lymph# 10/28/2013 1.7  0.9 - 3.3 10e3/uL Final  . MONO# 10/28/2013 0.5  0.1 - 0.9 10e3/uL Final  . Eosinophils Absolute 10/28/2013 0.1  0.0 - 0.5 10e3/uL Final  . Basophils Absolute 10/28/2013 0.0  0.0 - 0.1 10e3/uL Final  . NEUT% 10/28/2013 64.4  38.4 - 76.8 % Final  . LYMPH% 10/28/2013 25.9  14.0 - 49.7 % Final  . MONO% 10/28/2013 7.2  0.0 - 14.0 %  Final  . EOS% 10/28/2013 1.9  0.0 - 7.0 % Final  . BASO% 10/28/2013 0.6  0.0 - 2.0 % Final    ASSESSMENT & PLAN:  Cancer of upper-outer quadrant of female breast Continue with hormonal therapy. Monitoring for toxicities. Recommended DEXA scan in 6 months. Followup in 6 months after DEXA scan. When she returns in 6 months, she will get FSH, estradiol, CBC and CMP. If she is menopausal, we can consider discontinuing Zoladex. Recent results from SOFT trial do not show much benefit to Anti-estrogen therapy+Zolodex.   The patient has a good understanding of the overall plan. she agrees with it. She will call with any problems that may  develop before her next visit here.  I spent 20 minutes counseling the patient face to face. The total time spent in the appointment was 30 minutes and more than 50% was on counseling and review of test results    Rulon Eisenmenger, MD 10/28/2013 5:20 PM

## 2013-11-01 ENCOUNTER — Telehealth: Payer: Self-pay | Admitting: Hematology and Oncology

## 2013-11-01 NOTE — Telephone Encounter (Signed)
lmonvm for pt re appts for feb 2016 including bone density. schedule mailed. appt for 8/25 remains.

## 2013-11-04 ENCOUNTER — Other Ambulatory Visit: Payer: Self-pay | Admitting: Oncology

## 2013-11-11 ENCOUNTER — Telehealth: Payer: Self-pay | Admitting: *Deleted

## 2013-11-11 ENCOUNTER — Telehealth: Payer: Self-pay

## 2013-11-11 NOTE — Telephone Encounter (Signed)
Order sent for [id number] and [phone] sent to scan

## 2013-11-11 NOTE — Telephone Encounter (Signed)
Faxed order to bone density to [employer].  Sent to scan.

## 2013-11-12 ENCOUNTER — Telehealth: Payer: Self-pay | Admitting: Hematology and Oncology

## 2013-11-12 ENCOUNTER — Ambulatory Visit (HOSPITAL_BASED_OUTPATIENT_CLINIC_OR_DEPARTMENT_OTHER): Payer: 59

## 2013-11-12 ENCOUNTER — Other Ambulatory Visit: Payer: 59

## 2013-11-12 ENCOUNTER — Ambulatory Visit: Payer: 59 | Admitting: Adult Health

## 2013-11-12 VITALS — BP 147/80 | HR 80 | Temp 98.1°F

## 2013-11-12 DIAGNOSIS — Z5111 Encounter for antineoplastic chemotherapy: Secondary | ICD-10-CM

## 2013-11-12 DIAGNOSIS — C773 Secondary and unspecified malignant neoplasm of axilla and upper limb lymph nodes: Secondary | ICD-10-CM

## 2013-11-12 DIAGNOSIS — C50912 Malignant neoplasm of unspecified site of left female breast: Secondary | ICD-10-CM

## 2013-11-12 DIAGNOSIS — C50419 Malignant neoplasm of upper-outer quadrant of unspecified female breast: Secondary | ICD-10-CM

## 2013-11-12 MED ORDER — GOSERELIN ACETATE 3.6 MG ~~LOC~~ IMPL
3.6000 mg | DRUG_IMPLANT | Freq: Once | SUBCUTANEOUS | Status: AC
  Administered 2013-11-12: 3.6 mg via SUBCUTANEOUS
  Filled 2013-11-12: qty 3.6

## 2013-11-12 NOTE — Telephone Encounter (Signed)
per pt to sch inj for 9/22-sch-adv pt to contact nurse for continuing orders-pt understood

## 2013-12-06 ENCOUNTER — Telehealth: Payer: Self-pay | Admitting: *Deleted

## 2013-12-06 ENCOUNTER — Other Ambulatory Visit (INDEPENDENT_AMBULATORY_CARE_PROVIDER_SITE_OTHER): Payer: 59

## 2013-12-06 ENCOUNTER — Other Ambulatory Visit: Payer: Self-pay | Admitting: *Deleted

## 2013-12-06 DIAGNOSIS — C50419 Malignant neoplasm of upper-outer quadrant of unspecified female breast: Secondary | ICD-10-CM

## 2013-12-06 DIAGNOSIS — N951 Menopausal and female climacteric states: Secondary | ICD-10-CM

## 2013-12-06 DIAGNOSIS — C50412 Malignant neoplasm of upper-outer quadrant of left female breast: Secondary | ICD-10-CM

## 2013-12-06 MED ORDER — LORAZEPAM 0.5 MG PO TABS: ORAL_TABLET | ORAL | Status: DC

## 2013-12-06 NOTE — Telephone Encounter (Signed)
What labs and dx?  

## 2013-12-06 NOTE — Telephone Encounter (Signed)
I don't recall asking her to return for any labs`.  I see that her oncologist ordered some and they are in as "future', so maybe that's what was needed?

## 2013-12-07 LAB — COMPREHENSIVE METABOLIC PANEL
ALBUMIN: 4.4 g/dL (ref 3.5–5.2)
ALK PHOS: 91 U/L (ref 39–117)
ALT: 26 U/L (ref 0–35)
AST: 25 U/L (ref 0–37)
BUN: 13 mg/dL (ref 6–23)
CO2: 25 mEq/L (ref 19–32)
Calcium: 9.3 mg/dL (ref 8.4–10.5)
Chloride: 103 mEq/L (ref 96–112)
Creat: 0.84 mg/dL (ref 0.50–1.10)
Glucose, Bld: 91 mg/dL (ref 70–99)
Potassium: 4.2 mEq/L (ref 3.5–5.3)
SODIUM: 139 meq/L (ref 135–145)
Total Bilirubin: 0.4 mg/dL (ref 0.2–1.2)
Total Protein: 7 g/dL (ref 6.0–8.3)

## 2013-12-07 LAB — CBC WITH DIFFERENTIAL/PLATELET
BASOS ABS: 0 10*3/uL (ref 0.0–0.1)
Basophils Relative: 0 % (ref 0–1)
Eosinophils Absolute: 0.2 10*3/uL (ref 0.0–0.7)
Eosinophils Relative: 3 % (ref 0–5)
HCT: 42.9 % (ref 36.0–46.0)
Hemoglobin: 14.3 g/dL (ref 12.0–15.0)
LYMPHS ABS: 1.5 10*3/uL (ref 0.7–4.0)
LYMPHS PCT: 26 % (ref 12–46)
MCH: 32.4 pg (ref 26.0–34.0)
MCHC: 33.3 g/dL (ref 30.0–36.0)
MCV: 97.3 fL (ref 78.0–100.0)
Monocytes Absolute: 0.4 10*3/uL (ref 0.1–1.0)
Monocytes Relative: 8 % (ref 3–12)
NEUTROS ABS: 3.5 10*3/uL (ref 1.7–7.7)
NEUTROS PCT: 63 % (ref 43–77)
PLATELETS: 231 10*3/uL (ref 150–400)
RBC: 4.41 MIL/uL (ref 3.87–5.11)
RDW: 13.1 % (ref 11.5–15.5)
WBC: 5.6 10*3/uL (ref 4.0–10.5)

## 2013-12-07 LAB — FOLLICLE STIMULATING HORMONE: FSH: 14.2 m[IU]/mL

## 2013-12-10 ENCOUNTER — Ambulatory Visit (HOSPITAL_BASED_OUTPATIENT_CLINIC_OR_DEPARTMENT_OTHER): Payer: 59

## 2013-12-10 VITALS — BP 148/88 | HR 96 | Temp 98.8°F

## 2013-12-10 DIAGNOSIS — Z5111 Encounter for antineoplastic chemotherapy: Secondary | ICD-10-CM

## 2013-12-10 DIAGNOSIS — C773 Secondary and unspecified malignant neoplasm of axilla and upper limb lymph nodes: Secondary | ICD-10-CM

## 2013-12-10 DIAGNOSIS — C50912 Malignant neoplasm of unspecified site of left female breast: Secondary | ICD-10-CM

## 2013-12-10 DIAGNOSIS — D0590 Unspecified type of carcinoma in situ of unspecified breast: Secondary | ICD-10-CM

## 2013-12-10 DIAGNOSIS — C50419 Malignant neoplasm of upper-outer quadrant of unspecified female breast: Secondary | ICD-10-CM

## 2013-12-10 LAB — ESTRADIOL, ULTRA SENS: Estradiol, Ultra Sensitive: 4 pg/mL

## 2013-12-10 MED ORDER — GOSERELIN ACETATE 3.6 MG ~~LOC~~ IMPL
3.6000 mg | DRUG_IMPLANT | Freq: Once | SUBCUTANEOUS | Status: AC
  Administered 2013-12-10: 3.6 mg via SUBCUTANEOUS
  Filled 2013-12-10: qty 3.6

## 2013-12-11 ENCOUNTER — Telehealth: Payer: Self-pay | Admitting: Internal Medicine

## 2013-12-11 ENCOUNTER — Encounter: Payer: Self-pay | Admitting: Internal Medicine

## 2013-12-11 ENCOUNTER — Ambulatory Visit: Payer: 59 | Admitting: Family Medicine

## 2013-12-11 ENCOUNTER — Ambulatory Visit (INDEPENDENT_AMBULATORY_CARE_PROVIDER_SITE_OTHER): Payer: 59 | Admitting: Internal Medicine

## 2013-12-11 VITALS — BP 140/80 | HR 77 | Temp 97.9°F | Resp 14 | Ht 64.0 in | Wt 167.2 lb

## 2013-12-11 DIAGNOSIS — R002 Palpitations: Secondary | ICD-10-CM

## 2013-12-11 DIAGNOSIS — I10 Essential (primary) hypertension: Secondary | ICD-10-CM

## 2013-12-11 DIAGNOSIS — N951 Menopausal and female climacteric states: Secondary | ICD-10-CM

## 2013-12-11 DIAGNOSIS — R079 Chest pain, unspecified: Secondary | ICD-10-CM

## 2013-12-11 DIAGNOSIS — R351 Nocturia: Secondary | ICD-10-CM

## 2013-12-11 DIAGNOSIS — R5381 Other malaise: Secondary | ICD-10-CM

## 2013-12-11 DIAGNOSIS — R5383 Other fatigue: Secondary | ICD-10-CM

## 2013-12-11 MED ORDER — CYANOCOBALAMIN 1000 MCG/ML IJ SOLN
1000.0000 ug | Freq: Once | INTRAMUSCULAR | Status: AC
  Administered 2013-12-11: 1000 ug via INTRAMUSCULAR

## 2013-12-11 MED ORDER — AMLODIPINE BESYLATE 5 MG PO TABS: 5.0000 mg | ORAL_TABLET | Freq: Every day | ORAL | Status: DC

## 2013-12-11 NOTE — Telephone Encounter (Signed)
Patient Information:  Caller Name: Talor  Phone: [phone]  Patient: Mary [name], Mary [name]  Gender: Female  DOB: 1960/06/04  Age: 53 Years  PCP: Deborra Medina (Adults only)  Pregnant: No  Office Follow Up:  Does the office need to follow up with this patient?: Yes  Instructions For The Office: Appointment not available today or tomorrow. Would like assistance in getting appointment for patient - please review - can reach patient at work  [phone].   Symptoms  Reason For Call & Symptoms: Felt sudden onset of extreme tiredness, anxiety, feeling nervous, no energy starting Sunday 9/6.  Has had headache intermittently.  Has had to force self to get out of bed especially at first.  Noting frequency at night, added HCTZ to morning meds 07/2013.  Found BP elevated at [facility] yesterday 9/22 - at first  BP 166/80 then later after rest 149/80.  Occassional hot flashes. Symptoms now very bothersome.  Reviewed Health History In EMR: Yes  Reviewed Medications In EMR: Yes  Reviewed Allergies In EMR: Yes  Reviewed Surgeries / Procedures: Yes  Date of Onset of Symptoms: 11/24/2013 OB / GYN:  LMP: Unknown  Guideline(s) Used:  Weakness (Generalized) and Fatigue  Disposition Per Guideline:   See Today in Office  Reason For Disposition Reached:   Taking a medicine that could cause weakness (e.g., blood pressure medications, diuretics)  Advice Given:  N/A  Patient Will Follow Care Advice:  YES

## 2013-12-11 NOTE — Telephone Encounter (Signed)
Please add patient.

## 2013-12-11 NOTE — Assessment & Plan Note (Addendum)
Stopping hctz due to polyuria and starting amlodipine

## 2013-12-11 NOTE — Patient Instructions (Addendum)
Your hot flashes and panic attacks do not appear to be due to any physical problem.  I think the past 2 years is catching up with you.   Start the effexor at 1/2 tablet two times daily (am and pm)  After a few days ,  Increase to full tablet am and pm   At the end of month 1,  E mail me on mychart and I will increase the dose and change it once daily extended release    I am switching your blood pressure medication from hctz to amlodipine 5 mg daily  You can take it day or night    If the B12 injection helps,  We can continue these at home weekly

## 2013-12-11 NOTE — Progress Notes (Signed)
Pre visit review using our clinic review tool, if applicable. No additional management support is needed unless otherwise documented below in the visit note. 

## 2013-12-11 NOTE — Telephone Encounter (Signed)
Please add her to 4:30 slot today.  As soon as she gets here you can do the EKG, UA or blood draw if I am behind

## 2013-12-11 NOTE — Telephone Encounter (Signed)
I spoke with Mary [name], offered her appoint today at [facility].  She accepted the appoint however she really would like to speak with Dr Derrel Nip today.

## 2013-12-12 ENCOUNTER — Encounter: Payer: Self-pay | Admitting: Internal Medicine

## 2013-12-12 LAB — URINALYSIS, ROUTINE W REFLEX MICROSCOPIC
BILIRUBIN URINE: NEGATIVE
Hgb urine dipstick: NEGATIVE
KETONES UR: NEGATIVE
LEUKOCYTES UA: NEGATIVE
Nitrite: NEGATIVE
PH: 6 (ref 5.0–8.0)
RBC / HPF: NONE SEEN (ref 0–?)
SPECIFIC GRAVITY, URINE: 1.015 (ref 1.000–1.030)
TOTAL PROTEIN, URINE-UPE24: NEGATIVE
URINE GLUCOSE: NEGATIVE
Urobilinogen, UA: 0.2 (ref 0.0–1.0)

## 2013-12-12 LAB — BRAIN NATRIURETIC PEPTIDE: PRO B NATRI PEPTIDE: 8 pg/mL (ref 0.0–100.0)

## 2013-12-12 LAB — T4 AND TSH
T4, Total: 7.2 ug/dL (ref 4.5–12.0)
TSH: 3.21 u[IU]/mL (ref 0.450–4.500)

## 2013-12-12 LAB — MAGNESIUM: Magnesium: 2.1 mg/dL (ref 1.5–2.5)

## 2013-12-12 LAB — VITAMIN B12: VITAMIN B 12: 1264 pg/mL — AB (ref 211–911)

## 2013-12-14 DIAGNOSIS — R5383 Other fatigue: Secondary | ICD-10-CM

## 2013-12-14 DIAGNOSIS — R5381 Other malaise: Secondary | ICD-10-CM | POA: Insufficient documentation

## 2013-12-14 NOTE — Assessment & Plan Note (Signed)
Multifactorial .  Due to disrupted sleep and deconditioining.  Trial of b12 for home use.   Lab Results  Component Value Date   WBC 5.6 12/06/2013   HGB 14.3 12/06/2013   HCT 42.9 12/06/2013   MCV 97.3 12/06/2013   PLT 231 12/06/2013   Lab Results  Component Value Date   VITAMINB12 1264* 12/11/2013   Lab Results  Component Value Date   TSH 3.210 12/11/2013

## 2013-12-14 NOTE — Assessment & Plan Note (Signed)
Trial of effexor.

## 2013-12-14 NOTE — Progress Notes (Signed)
**Note Mary-Identified via Obfuscation** Patient ID: Mary [name], female   DOB: [birth year], 53 y.o.   MRN: [id number]  Patient Active Problem List   Diagnosis Date Noted  . Other malaise and fatigue 12/14/2013  . Hot flashes, menopausal 08/08/2012  . Neutropenia, drug-induced 12/15/2011  . Cancer of upper-outer quadrant of female breast 11/17/2011  . Breast cancer 11/17/2011  . Hypertension 09/23/2011  . PLANTAR FASCIITIS, LEFT 12/01/2009  . UNEQUAL LEG LENGTH 12/01/2009    Subjective:  CC:   Chief Complaint  Patient presents with  . Fatigue  . Anxiety    HPI:   Mary [name] is a 53 y.o. female who presents for Recent development of fatigue, malaise, hot flashes accompanied by palpitations,  And hypertension. Patient was advised at last visit to start effexor for management of hot flashes brought on by adjuvant therapy for breast cancer but declined.    Past Medical History  Diagnosis Date  . Irritable bowel syndrome   . Dental crowns present     x 4  . Neutropenia, drug-induced 12/15/2011  . Breast cancer August 2013    bilateral, er/pr+  . Complication of anesthesia     pt states she had the shakes extremely bad  . Cough     at night d/t reflux;nothing productive  . History of bronchitis     08/2011  . GERD (gastroesophageal reflux disease)     takes Nexium daily  . IBS (irritable bowel syndrome)     immodium prn  . History of colon polyps   . Urinary frequency   . Nocturia   . Insomnia     takes Ambien nightly  . Anxiety     takes Xanax prn  . Plantar fasciitis   . Hot flashes, menopausal 08/08/2012  . Status post chemotherapy     FEC 100 starting on 12/08/2011 - 01/18/12/single agent Taxol x12 weeks from 02/02/2012 through January 2014   . S/P radiation therapy 07/03/2012 through 08/18/2038                                                         Right chest wall/regional lymph nodes 5040 cGy 28 sessions, right chest wall/mastectomy scar boost 1000 cGy 5 sessions                           . Use of tamoxifen (Nolvadex)     Past Surgical History  Procedure Laterality Date  . Portacath placement  12/02/2011    Procedure: INSERTION PORT-A-CATH;  Surgeon: Adin Hector, MD;  Location: [place];  Service: General;  Laterality: N/A;  insertion port a cath with fluoroscopy  . Breast lumpectomy  2008    left  . Colonoscopy    . Mastectomy with axillary lymph node dissection Right 05/18/2012    Procedure: MASTECTOMY WITH AXILLARY LYMPH NODE DISSECTION;  Surgeon: Adin Hector, MD;  Location: [place];  Service: General;  Laterality: Right;  Bilateral Total Mastectomy , right axillary lymph node dissection left axillary sentinel node biopsy, removal of Porta Cath  . Simple mastectomy with axillary sentinel node biopsy Left 05/18/2012    Procedure: SIMPLE MASTECTOMY WITH AXILLARY SENTINEL NODE BIOPSY;  Surgeon: Adin Hector, MD;  Location: [place];  Service: General;  Laterality: Left;  . Port-a-cath  removal N/A 05/18/2012    Procedure: REMOVAL PORT-A-CATH;  Surgeon: Adin Hector, MD;  Location: [place];  Service: General;  Laterality: N/A;       The following portions of the patient's history were reviewed and updated as appropriate: Allergies, current medications, and problem list.    Review of Systems:   Patient denies headache, fevers, malaise, unintentional weight loss, skin rash, eye pain, sinus congestion and sinus pain, sore throat, dysphagia,  hemoptysis , cough, dyspnea, wheezing, chest pain, palpitations, orthopnea, edema, abdominal pain, nausea, melena, diarrhea, constipation, flank pain, dysuria, hematuria, urinary  Frequency, nocturia, numbness, tingling, seizures,  Focal weakness, Loss of consciousness,  Tremor, insomnia, depression, anxiety, and suicidal ideation.     History   Social History  . Marital Status: Married    Spouse Name: N/A    Number of Children: N/A  . Years of Education: N/A   Occupational History  . [employer]   Social History Main Topics  . Smoking status: Former Smoker -- 0.50 packs/day    Quit date: 03/21/1997  . Smokeless tobacco: Never Used  . Alcohol Use: 0.0 oz/week     Comment: occasionally wine  . Drug Use: No  . Sexual Activity: Yes    Birth Control/ Protection: None   Other Topics Concern  . Not on file   Social History Narrative   Married to [employer]  No children 38 years old with fmp.  20 year use of birth control pills.      Objective:  Filed Vitals:   12/11/13 1625  BP: 140/80  Pulse: 77  Temp: 97.9 F (36.6 C)  Resp: 14     General appearance: alert, cooperative and appears stated age Ears: normal TM's and external ear canals both ears Throat: lips, mucosa, and tongue normal; teeth and gums normal Neck: no adenopathy, no carotid bruit, supple, symmetrical, trachea midline and thyroid not enlarged, symmetric, no tenderness/mass/nodules Back: symmetric, no curvature. ROM normal. No CVA tenderness. Lungs: clear to auscultation bilaterally Heart: regular rate and rhythm, S1, S2 normal, no murmur, click, rub or gallop Abdomen: soft, non-tender; bowel sounds normal; no masses,  no organomegaly Pulses: 2+ and symmetric Skin: Skin color, texture, turgor normal. No rashes or lesions Lymph nodes: Cervical, supraclavicular, and axillary nodes normal.  Assessment and Plan:  Hypertension Stopping hctz due to polyuria and starting amlodipine   Hot flashes, menopausal Trial of effexor.   Other malaise and fatigue Multifactorial .  Due to disrupted sleep and deconditioining.  Trial of b12 for home use.   Lab Results  Component Value Date   WBC 5.6 12/06/2013   HGB 14.3 12/06/2013   HCT 42.9 12/06/2013   MCV 97.3 12/06/2013   PLT 231 12/06/2013   Lab Results  Component Value Date   VITAMINB12 1264* 12/11/2013   Lab Results  Component Value Date   TSH 3.210 12/11/2013      Updated Medication List Outpatient Encounter Prescriptions as of 12/11/2013   Medication Sig  . ALPRAZolam (XANAX) 0.25 MG tablet TAKE ONE TABLET BY MOUTH TWICE DAILY AS NEEDED FOR ANXIETY  . Ascorbic Acid (VITAMIN C) 100 MG tablet Take 100 mg by mouth daily.    . Calcium Carbonate-Vitamin D (CALTRATE 600+D PO) Take 1 tablet by mouth daily.  . cholecalciferol (VITAMIN D) 400 UNITS TABS Take 1,000 Units by mouth daily.   Marland Kitchen esomeprazole (NEXIUM) 40 MG capsule Take 40 mg by mouth daily before breakfast.  . exemestane (AROMASIN) 25  MG tablet Take 1 tablet (25 mg total) by mouth daily after breakfast.  . goserelin (ZOLADEX) 3.6 MG injection Inject 3.6 mg into the skin every 28 (twenty-eight) days.  Marland Kitchen loperamide (IMODIUM A-D) 2 MG tablet Take 2 mg by mouth 4 (four) times daily as needed for diarrhea or loose stools.   Marland Kitchen LORazepam (ATIVAN) 0.5 MG tablet TAKE ONE TABLET BY MOUTH EVERY 8 HOURS  . Multiple Vitamin (MULTIVITAMIN) tablet Take 1 tablet by mouth daily.    Marland Kitchen venlafaxine (EFFEXOR) 37.5 MG tablet Take 1 tablet (37.5 mg total) by mouth 2 (two) times daily.  . [DISCONTINUED] hydrochlorothiazide (HYDRODIURIL) 25 MG tablet Take 1 tablet (25 mg total) by mouth daily.  Marland Kitchen amLODipine (NORVASC) 5 MG tablet Take 1 tablet (5 mg total) by mouth daily.  . [DISCONTINUED] calcium carbonate (OS-CAL) 600 MG TABS tablet Take 600 mg by mouth 2 (two) times daily with a meal.  . [EXPIRED] cyanocobalamin ((VITAMIN B-12)) injection 1,000 mcg      Orders Placed This Encounter  Procedures  . B Nat Peptide  . Magnesium  . Urinalysis, Routine w reflex microscopic  . B12    No Follow-up on file.

## 2013-12-17 ENCOUNTER — Encounter: Payer: Self-pay | Admitting: Internal Medicine

## 2013-12-17 NOTE — Telephone Encounter (Signed)
error 

## 2013-12-25 MED ORDER — ESCITALOPRAM OXALATE 5 MG PO TABS: 5.0000 mg | ORAL_TABLET | Freq: Every day | ORAL | Status: DC

## 2013-12-25 NOTE — Telephone Encounter (Signed)
Done!!  TT

## 2013-12-30 ENCOUNTER — Telehealth: Payer: Self-pay | Admitting: Hematology and Oncology

## 2013-12-30 NOTE — Telephone Encounter (Signed)
LVM to view apt on [facility]

## 2014-01-07 ENCOUNTER — Ambulatory Visit (HOSPITAL_BASED_OUTPATIENT_CLINIC_OR_DEPARTMENT_OTHER): Payer: 59

## 2014-01-07 ENCOUNTER — Ambulatory Visit: Payer: 59

## 2014-01-07 VITALS — BP 146/88 | HR 73 | Temp 98.1°F

## 2014-01-07 DIAGNOSIS — C50912 Malignant neoplasm of unspecified site of left female breast: Secondary | ICD-10-CM

## 2014-01-07 DIAGNOSIS — C50412 Malignant neoplasm of upper-outer quadrant of left female breast: Secondary | ICD-10-CM

## 2014-01-07 DIAGNOSIS — Z5111 Encounter for antineoplastic chemotherapy: Secondary | ICD-10-CM

## 2014-01-07 MED ORDER — GOSERELIN ACETATE 3.6 MG ~~LOC~~ IMPL
3.6000 mg | DRUG_IMPLANT | Freq: Once | SUBCUTANEOUS | Status: AC
  Administered 2014-01-07: 3.6 mg via SUBCUTANEOUS
  Filled 2014-01-07: qty 3.6

## 2014-01-07 NOTE — Patient Instructions (Signed)
Goserelin injection What is this medicine? GOSERELIN (GOE se rel in) is similar to a hormone found in the body. It lowers the amount of sex hormones that the body makes. Men will have lower testosterone levels and women will have lower estrogen levels while taking this medicine. In men, this medicine is used to treat prostate cancer; the injection is either given once per month or once every 12 weeks. A once per month injection (only) is used to treat women with endometriosis, dysfunctional uterine bleeding, or advanced breast cancer. This medicine may be used for other purposes; ask your health care provider or pharmacist if you have questions. COMMON BRAND NAME(S): Zoladex What should I tell my health care provider before I take this medicine? They need to know if you have any of these conditions (some only apply to women): -diabetes -heart disease or previous heart attack -high blood pressure -high cholesterol -kidney disease -osteoporosis or low bone density -problems passing urine -spinal cord injury -stroke -tobacco smoker -an unusual or allergic reaction to goserelin, hormone therapy, other medicines, foods, dyes, or preservatives -pregnant or trying to get pregnant -breast-feeding How should I use this medicine? This medicine is for injection under the skin. It is given by a health care professional in a hospital or clinic setting. Men receive this injection once every 4 weeks or once every 12 weeks. Women will only receive the once every 4 weeks injection. Talk to your pediatrician regarding the use of this medicine in children. Special care may be needed. Overdosage: If you think you have taken too much of this medicine contact a poison control center or emergency room at once. NOTE: This medicine is only for you. Do not share this medicine with others. What if I miss a dose? It is important not to miss your dose. Call your doctor or health care professional if you are unable to  keep an appointment. What may interact with this medicine? -female hormones like estrogen -herbal or dietary supplements like black cohosh, chasteberry, or DHEA -female hormones like testosterone -prasterone This list may not describe all possible interactions. Give your health care provider a list of all the medicines, herbs, non-prescription drugs, or dietary supplements you use. Also tell them if you smoke, drink alcohol, or use illegal drugs. Some items may interact with your medicine. What should I watch for while using this medicine? Visit your doctor or health care professional for regular checks on your progress. Your symptoms may appear to get worse during the first weeks of this therapy. Tell your doctor or healthcare professional if your symptoms do not start to get better or if they get worse after this time. Your bones may get weaker if you take this medicine for a long time. If you smoke or frequently drink alcohol you may increase your risk of bone loss. A family history of osteoporosis, chronic use of drugs for seizures (convulsions), or corticosteroids can also increase your risk of bone loss. Talk to your doctor about how to keep your bones strong. This medicine should stop regular monthly menstration in women. Tell your doctor if you continue to menstrate. Women should not become pregnant while taking this medicine or for 12 weeks after stopping this medicine. Women should inform their doctor if they wish to become pregnant or think they might be pregnant. There is a potential for serious side effects to an unborn child. Talk to your health care professional or pharmacist for more information. Do not breast-feed an infant while taking   this medicine. Men should inform their doctors if they wish to father a child. This medicine may lower sperm counts. Talk to your health care professional or pharmacist for more information. What side effects may I notice from receiving this  medicine? Side effects that you should report to your doctor or health care professional as soon as possible: -allergic reactions like skin rash, itching or hives, swelling of the face, lips, or tongue -bone pain -breathing problems -changes in vision -chest pain -feeling faint or lightheaded, falls -fever, chills -pain, swelling, warmth in the leg -pain, tingling, numbness in the hands or feet -signs and symptoms of low blood pressure like dizziness; feeling faint or lightheaded, falls; unusually weak or tired -stomach pain -swelling of the ankles, feet, hands -trouble passing urine or change in the amount of urine -unusually high or low blood pressure -unusually weak or tired Side effects that usually do not require medical attention (report to your doctor or health care professional if they continue or are bothersome): -change in sex drive or performance -changes in breast size in both males and females -changes in emotions or moods -headache -hot flashes -irritation at site where injected -loss of appetite -skin problems like acne, dry skin -vaginal dryness This list may not describe all possible side effects. Call your doctor for medical advice about side effects. You may report side effects to FDA at 1-800-FDA-1088. Where should I keep my medicine? This drug is given in a hospital or clinic and will not be stored at home. NOTE: This sheet is a summary. It may not cover all possible information. If you have questions about this medicine, talk to your doctor, pharmacist, or health care provider.  2015, Elsevier/Gold Standard. (2013-05-14 11:10:35)  

## 2014-01-09 ENCOUNTER — Other Ambulatory Visit: Payer: Self-pay

## 2014-01-09 DIAGNOSIS — C50412 Malignant neoplasm of upper-outer quadrant of left female breast: Secondary | ICD-10-CM

## 2014-01-09 MED ORDER — LORAZEPAM 0.5 MG PO TABS: ORAL_TABLET | ORAL | Status: DC

## 2014-01-09 NOTE — Telephone Encounter (Signed)
LMOVM - prescription called in to [place].

## 2014-01-27 ENCOUNTER — Encounter: Payer: Self-pay | Admitting: Hematology and Oncology

## 2014-01-27 NOTE — Progress Notes (Signed)
Put fmla form on nurse's desk °

## 2014-01-28 ENCOUNTER — Telehealth: Payer: Self-pay | Admitting: Internal Medicine

## 2014-01-28 ENCOUNTER — Encounter: Payer: Self-pay | Admitting: Hematology and Oncology

## 2014-01-28 NOTE — Telephone Encounter (Signed)
Looking at chart  I saw patient is being seen by [facility] for Malignancy of breast and that they are handling FMLA, but I did call and leave message to confirm if help was needed for the FMLA. FYI

## 2014-01-28 NOTE — Progress Notes (Signed)
Faxed fmla form to [employer] @ [phone] °

## 2014-02-04 ENCOUNTER — Ambulatory Visit (INDEPENDENT_AMBULATORY_CARE_PROVIDER_SITE_OTHER): Payer: 59

## 2014-02-04 ENCOUNTER — Encounter: Payer: Self-pay | Admitting: Podiatry

## 2014-02-04 ENCOUNTER — Ambulatory Visit (INDEPENDENT_AMBULATORY_CARE_PROVIDER_SITE_OTHER): Payer: 59 | Admitting: Podiatry

## 2014-02-04 ENCOUNTER — Ambulatory Visit (HOSPITAL_BASED_OUTPATIENT_CLINIC_OR_DEPARTMENT_OTHER): Payer: 59

## 2014-02-04 VITALS — BP 169/93 | HR 82 | Resp 16 | Ht 64.0 in | Wt 165.0 lb

## 2014-02-04 DIAGNOSIS — M779 Enthesopathy, unspecified: Secondary | ICD-10-CM

## 2014-02-04 DIAGNOSIS — M7752 Other enthesopathy of left foot: Secondary | ICD-10-CM

## 2014-02-04 DIAGNOSIS — C50912 Malignant neoplasm of unspecified site of left female breast: Secondary | ICD-10-CM

## 2014-02-04 DIAGNOSIS — M21962 Unspecified acquired deformity of left lower leg: Secondary | ICD-10-CM

## 2014-02-04 DIAGNOSIS — M25572 Pain in left ankle and joints of left foot: Secondary | ICD-10-CM

## 2014-02-04 DIAGNOSIS — C50412 Malignant neoplasm of upper-outer quadrant of left female breast: Secondary | ICD-10-CM

## 2014-02-04 DIAGNOSIS — Z5111 Encounter for antineoplastic chemotherapy: Secondary | ICD-10-CM

## 2014-02-04 MED ORDER — GOSERELIN ACETATE 3.6 MG ~~LOC~~ IMPL
3.6000 mg | DRUG_IMPLANT | Freq: Once | SUBCUTANEOUS | Status: AC
  Administered 2014-02-04: 3.6 mg via SUBCUTANEOUS
  Filled 2014-02-04: qty 3.6

## 2014-02-04 NOTE — Progress Notes (Signed)
**Note Mary-Identified via Obfuscation**    Subjective:    Patient ID: Mary [name], female    DOB: 05/05/1960, 53 y.o.   MRN: [id number]  HPI Comments: 53 year old female presents to the office due to left foot and ankle pain which has been ongoing for approximate 2 weeks. She states that she had no inciting incident which made the area hurt. She states that over the last couple weeks the pain has improved although she continues to have some symptoms. She does walk during lunch on a consistent basis however since the pain started she has stopped exercising. She states that she can wear tennis shoes without any discomfort however she wears any other shoes she has pain. She says the pain is particularly worse in the morning. She has been icing the area as well as taking Advil. No acute injury or trauma to the area. No other complaints at this time.  Foot Pain      Review of Systems  Musculoskeletal:       Joint pain Difficulty walking   All other systems reviewed and are negative.      Objective:   Physical Exam AAO x3, NAD DP/PT pulses palpable bilaterally, CRT less than 3 seconds Protective sensation intact with Simms Weinstein monofilament, vibratory sensation intact, Achilles tendon reflex intact Bilateral decrease in medial arch height upon weightbearing and mild equinus.  On the left foot there is tenderness overlying the lateral aspect of the foot over the sinus tarsi. There is no pain along the course of the lateral ankle ligaments. There is no pain with anterior drawer test and talar tilt and both appear to be negative. There is mild tenderness on the course of the posterior tibial tendon and along its insertion of the navicular. The patient is able to perform a single and double heel rise although she does have some discomfort with a single heel rise on the left side. There is no overlying edema, erythema, increase in warmth or left foot. There is no pinpoint bony tenderness or pain with vibratory sensation overlying  the foot or ankle. MMT 5/5, ROM WNL No calf pain, swelling, warmth, erythema. No open lesions or pre-ulcerative lesions.       Assessment & Plan:  53 year old female with early posterior tibial tendon dysfunction/tendinitis, pain in the sinus tarsi likely from flat foot. -X-rays were obtained and reviewed with the patient. -Conservative versus surgical treatment discussed including alternatives, risks, complications. -At this time dispensed ankle brace to see this we'll help alleviate some of her symptoms. I discussed with her proper shoe gear given her foot type. Also discussed both over-the-counter versus custom orthotics to help support her foot type long-term. She will get purchasing some over-the-counter inserts. -Continue ice and anti-inflammatories as needed -Discussed stretching/strengthening exercises. -Follow-up in 3 weeks. In the meantime, call the office with any questions, concerns, change in symptoms.

## 2014-02-04 NOTE — Patient Instructions (Signed)
Goserelin injection What is this medicine? GOSERELIN (GOE se rel in) is similar to a hormone found in the body. It lowers the amount of sex hormones that the body makes. Men will have lower testosterone levels and women will have lower estrogen levels while taking this medicine. In men, this medicine is used to treat prostate cancer; the injection is either given once per month or once every 12 weeks. A once per month injection (only) is used to treat women with endometriosis, dysfunctional uterine bleeding, or advanced breast cancer. This medicine may be used for other purposes; ask your health care provider or pharmacist if you have questions. COMMON BRAND NAME(S): Zoladex What should I tell my health care provider before I take this medicine? They need to know if you have any of these conditions (some only apply to women): -diabetes -heart disease or previous heart attack -high blood pressure -high cholesterol -kidney disease -osteoporosis or low bone density -problems passing urine -spinal cord injury -stroke -tobacco smoker -an unusual or allergic reaction to goserelin, hormone therapy, other medicines, foods, dyes, or preservatives -pregnant or trying to get pregnant -breast-feeding How should I use this medicine? This medicine is for injection under the skin. It is given by a health care professional in a hospital or clinic setting. Men receive this injection once every 4 weeks or once every 12 weeks. Women will only receive the once every 4 weeks injection. Talk to your pediatrician regarding the use of this medicine in children. Special care may be needed. Overdosage: If you think you have taken too much of this medicine contact a poison control center or emergency room at once. NOTE: This medicine is only for you. Do not share this medicine with others. What if I miss a dose? It is important not to miss your dose. Call your doctor or health care professional if you are unable to  keep an appointment. What may interact with this medicine? -female hormones like estrogen -herbal or dietary supplements like black cohosh, chasteberry, or DHEA -female hormones like testosterone -prasterone This list may not describe all possible interactions. Give your health care provider a list of all the medicines, herbs, non-prescription drugs, or dietary supplements you use. Also tell them if you smoke, drink alcohol, or use illegal drugs. Some items may interact with your medicine. What should I watch for while using this medicine? Visit your doctor or health care professional for regular checks on your progress. Your symptoms may appear to get worse during the first weeks of this therapy. Tell your doctor or healthcare professional if your symptoms do not start to get better or if they get worse after this time. Your bones may get weaker if you take this medicine for a long time. If you smoke or frequently drink alcohol you may increase your risk of bone loss. A family history of osteoporosis, chronic use of drugs for seizures (convulsions), or corticosteroids can also increase your risk of bone loss. Talk to your doctor about how to keep your bones strong. This medicine should stop regular monthly menstration in women. Tell your doctor if you continue to menstrate. Women should not become pregnant while taking this medicine or for 12 weeks after stopping this medicine. Women should inform their doctor if they wish to become pregnant or think they might be pregnant. There is a potential for serious side effects to an unborn child. Talk to your health care professional or pharmacist for more information. Do not breast-feed an infant while taking   this medicine. Men should inform their doctors if they wish to father a child. This medicine may lower sperm counts. Talk to your health care professional or pharmacist for more information. What side effects may I notice from receiving this  medicine? Side effects that you should report to your doctor or health care professional as soon as possible: -allergic reactions like skin rash, itching or hives, swelling of the face, lips, or tongue -bone pain -breathing problems -changes in vision -chest pain -feeling faint or lightheaded, falls -fever, chills -pain, swelling, warmth in the leg -pain, tingling, numbness in the hands or feet -signs and symptoms of low blood pressure like dizziness; feeling faint or lightheaded, falls; unusually weak or tired -stomach pain -swelling of the ankles, feet, hands -trouble passing urine or change in the amount of urine -unusually high or low blood pressure -unusually weak or tired Side effects that usually do not require medical attention (report to your doctor or health care professional if they continue or are bothersome): -change in sex drive or performance -changes in breast size in both males and females -changes in emotions or moods -headache -hot flashes -irritation at site where injected -loss of appetite -skin problems like acne, dry skin -vaginal dryness This list may not describe all possible side effects. Call your doctor for medical advice about side effects. You may report side effects to FDA at 1-800-FDA-1088. Where should I keep my medicine? This drug is given in a hospital or clinic and will not be stored at home. NOTE: This sheet is a summary. It may not cover all possible information. If you have questions about this medicine, talk to your doctor, pharmacist, or health care provider.  2015, Elsevier/Gold Standard. (2013-05-14 11:10:35)  

## 2014-02-06 ENCOUNTER — Ambulatory Visit: Payer: Self-pay | Admitting: Podiatry

## 2014-02-25 ENCOUNTER — Ambulatory Visit (INDEPENDENT_AMBULATORY_CARE_PROVIDER_SITE_OTHER): Payer: 59 | Admitting: Podiatry

## 2014-02-25 VITALS — BP 161/89 | HR 77 | Resp 16

## 2014-02-25 DIAGNOSIS — M7752 Other enthesopathy of left foot: Secondary | ICD-10-CM

## 2014-02-25 DIAGNOSIS — M779 Enthesopathy, unspecified: Secondary | ICD-10-CM

## 2014-02-25 DIAGNOSIS — M25572 Pain in left ankle and joints of left foot: Secondary | ICD-10-CM

## 2014-02-25 NOTE — Patient Instructions (Signed)
Posterior Tibial Tendon Tendinitis with Rehab Tendonitis is a condition that is characterized by inflammation of a tendon or the lining (sheath) that surrounds it. The inflammation is usually caused by damage to the tendon, such as a tendon tear (strain). Sprains are classified into three categories. Grade 1 sprains cause pain, but the tendon is not lengthened. Grade 2 sprains include a lengthened ligament due to the ligament being stretched or partially ruptured. With grade 2 sprains there is still function, although the function may be diminished. Grade 3 sprains are characterized by a complete tear of the tendon or muscle, and function is usually impaired. Posterior tibialis tendonitis is tendonitis of the posterior tibial tendon, which attaches muscles of the lower leg to the foot. The posterior tibial tendon is located in the back of the ankle and helps the body straighten (plantar flex) and rotate inward (medially rotate) the ankle. SYMPTOMS   Pain, tenderness, swelling, warmth, and/or redness over the back of the inner ankle at the posterior tibial tendon or the inner part of the mid-foot.  Pain that worsens with plantar flexion or medial rotation of the ankle.  A crackling sound (crepitation) when the tendon is moved or touched. CAUSES  Posterior tibial tendonitis occurs when damage to the posterior tibial tendon starts an inflammatory response. Common mechanisms of injury include:  Degenerative (occurs with aging) processes that weaken the tendon and make it more susceptible to injury.  Stress placed on the tendon from an increase in the intensity, frequency, or duration of training.  Direct trauma to the ankle.  Returning to activity before a previous ankle injury is allowed to heal. RISK INCREASES WITH:  Activities that involve repetitive and/or stressful plantar flexion (jumping, kicking, or running up/down hills).  Poor strength and flexibility.  Flat feet.  Previous injury  to the foot, ankle, or leg. PREVENTION   Warm up and stretch properly before activity.  Allow for adequate recovery between workouts.  Maintain physical fitness:  Strength, flexibility, and endurance.  Cardiovascular fitness.  Learn and use proper technique. When possible, have a coach correct improper technique.  Complete rehabilitation from a previous foot, ankle, or leg injury.  If you have flat feet, wear arch supports (orthotics). PROGNOSIS  If treated properly, the symptoms of tendonitis usually resolve within 6 weeks. This period may be shorter for injuries caused by direct trauma. RELATED COMPLICATIONS   Prolonged healing time, if improperly treated or reinjured.  Recurrent symptoms that result in a chronic problem.  Partial or complete tendon tear (rupture) requiring surgery. TREATMENT  Treatment initially involves the use of ice and medication to help reduce pain and inflammation. The use of strengthening and stretching exercises may help reduce pain with activity. These exercises may be performed at home or with referral to a therapist. Often times, your caregiver will recommend immobilizing the ankle to allow the tendon to heal. If you have flat feet, you may be advised to wear orthotic arch supports. If symptoms persist for greater than 6 months despite nonsurgical (conservative) treatment, then surgery may be recommended. MEDICATION   If pain medication is necessary, then nonsteroidal anti-inflammatory medications, such as aspirin and ibuprofen, or other minor pain relievers, such as acetaminophen, are often recommended.  Do not take pain medication for 7 days before surgery.  Prescription pain relievers may be given if deemed necessary by your caregiver. Use only as directed and only as much as you need.  Corticosteroid injections may be given by your caregiver. These injections should  be reserved for the most serious cases because they may only be given a certain  number of times. HEAT AND COLD  Cold treatment (icing) relieves pain and reduces inflammation. Cold treatment should be applied for 10 to 15 minutes every 2 to 3 hours for inflammation and pain and immediately after any activity that aggravates your symptoms. Use ice packs or massage the area with a piece of ice (ice massage).  Heat treatment may be used prior to performing the stretching and strengthening activities prescribed by your caregiver, physical therapist, or athletic trainer. Use a heat pack or soak the injury in warm water. SEEK MEDICAL CARE IF:  Treatment seems to offer no benefit, or the condition worsens.  Any medications produce adverse side effects. EXERCISES RANGE OF MOTION (ROM) AND STRETCHING EXERCISES - Posterior Tibial Tendon Tendinitis These exercises may help you when beginning to rehabilitate your injury. Your symptoms may resolve with or without further involvement from your physician, physical therapist or athletic trainer. While completing these exercises, remember:   Restoring tissue flexibility helps normal motion to return to the joints. This allows healthier, less painful movement and activity.  An effective stretch should be held for at least 30 seconds.  A stretch should never be painful. You should only feel a gentle lengthening or release in the stretched tissue. RANGE OF MOTION - Ankle Plantar Flexion   Sit with your right / left leg crossed over your opposite knee.  Use your opposite hand to pull the top of your foot and toes toward you.  You should feel a gentle stretch on the top of your foot/ankle. Hold this position for __________ seconds. Repeat __________ times. Complete this exercise __________ times per day.  RANGE OF MOTION - Ankle Eversion   Sit with your right / left ankle crossed over your opposite knee.  Grip your foot with your opposite hand, placing your thumb on the top of your foot and your fingers across the bottom of your  foot.  Gently push your foot downward with a slight rotation so your littlest toes rise slightly.  You should feel a gentle stretch on the inside of your ankle. Hold the stretch for __________ seconds. Repeat __________ times. Complete this exercise __________ times per day.  RANGE OF MOTION - Ankle Inversion   Sit with your right / left ankle crossed over your opposite knee.  Grip your foot with your opposite hand, placing your thumb on the bottom of your foot and your fingers across the top of your foot.  Gently pull your foot so the smallest toe comes toward you and your thumb pushes the inside of the ball of your foot away from you.  You should feel a gentle stretch on the outside of your ankle. Hold the stretch for __________ seconds. Repeat __________ times. Complete this exercise __________ times per day.  RANGE OF MOTION - Dorsi/Plantar Flexion  While sitting with your right / left knee straight, draw the top of your foot upward by flexing your ankle. Then reverse the motion, pointing your toes downward.  Hold each position for __________ seconds.  After completing your first set of exercises, repeat this exercise with your knee bent. Repeat __________ times. Complete this exercise __________ times per day.  RANGE OF MOTION - Ankle Alphabet  Imagine your right / left big toe is a pen.  Keeping your hip and knee still, write out the entire alphabet with your "pen." Make the letters as large as you can without   increasing any discomfort. Repeat __________ times. Complete this exercise __________ times per day.  STRETCH - Gastrocsoleus   Sit with your right / left leg extended. Holding onto both ends of a belt or towel, loop it around the ball of your foot.  Keeping your right / left ankle and foot relaxed and your knee straight, pull your foot and ankle toward you using the belt/towel.  You should feel a gentle stretch behind your calf or knee. Hold this position for  __________ seconds. Repeat __________ times. Complete this exercise __________ times per day.  STRETCH - Gastroc, Standing   Place hands on wall.  Extend right / left leg, keeping the front knee somewhat bent.  Slightly point your toes inward on your back foot.  Keeping your right / left heel on the floor and your knee straight, shift your weight toward the wall, not allowing your back to arch.  You should feel a gentle stretch in the right / left calf. Hold this position for __________ seconds. Repeat __________ times. Complete this stretch __________ times per day. STRETCH - Soleus, Standing   Place hands on wall.  Extend right / left leg, keeping the other knee somewhat bent.  Slightly point your toes inward on your back foot.  Keep your right / left heel on the floor, bend your back knee, and slightly shift your weight over the back leg so that you feel a gentle stretch deep in your back calf.  Hold this position for __________ seconds. Repeat __________ times. Complete this stretch __________ times per day. STRENGTHENING EXERCISES - Posterior Tibial Tendon Tendinitis These exercises may help you when beginning to rehabilitate your injury. They may resolve your symptoms with or without further involvement from your physician, physical therapist, or athletic trainer. While completing these exercises, remember:   Muscles can gain both the endurance and the strength needed for everyday activities through controlled exercises.  Complete these exercises as instructed by your physician, physical therapist, or athletic trainer. Progress the resistance and repetitions only as guided. STRENGTH - Dorsiflexors  Secure a rubber exercise band/tubing to a fixed object (i.e., table, pole) and loop the other end around your right / left foot.  Sit on the floor facing the fixed object. The band/tubing should be slightly tense when your foot is relaxed.  Slowly draw your foot back toward you  using your ankle and toes.  Hold this position for __________ seconds. Slowly release the tension in the band and return your foot to the starting position. Repeat __________ times. Complete this exercise __________ times per day.  STRENGTH - Towel Curls  Sit in a chair positioned on a non-carpeted surface.  Place your foot on a towel, keeping your heel on the floor.  Pull the towel toward your heel by only curling your toes. Keep your heel on the floor.  If instructed by your physician, physical therapist, or athletic trainer, add ____________________ at the end of the towel. Repeat __________ times. Complete this exercise __________ times per day. STRENGTH - Ankle Eversion   Secure one end of a rubber exercise band/tubing to a fixed object (table, pole). Loop the other end around your foot just before your toes.  Place your fists between your knees. This will focus your strengthening at your ankle.  Drawing the band/tubing across your opposite foot, slowly pull your little toe out and up. Make sure the band/tubing is positioned to resist the entire motion.  Hold this position for __________ seconds.  Have   your muscles resist the band/tubing as it slowly pulls your foot back to the starting position. Repeat __________ times. Complete this exercise __________ times per day.  STRENGTH - Ankle Inversion   Secure one end of a rubber exercise band/tubing to a fixed object (table, pole). Loop the other end around your foot just before your toes.  Place your fists between your knees. This will focus your strengthening at your ankle.  Slowly, pull your big toe up and in, making sure the band/tubing is positioned to resist the entire motion.  Hold this position for __________ seconds.  Have your muscles resist the band/tubing as it slowly pulls your foot back to the starting position. Repeat __________ times. Complete this exercises __________ times per day.  Document Released:  03/07/2005 Document Revised: 07/22/2013 Document Reviewed: 06/19/2008 [employer] Patient Information 2015 [employer]. This information is not intended to replace advice given to you by your health care provider. Make sure you discuss any questions you have with your health care provider.  

## 2014-02-26 NOTE — Progress Notes (Signed)
**Note Mary-Identified via Obfuscation** Patient ID: Mary [name], female   DOB: 1960-05-26, 53 y.o.   MRN: [id number]  Subjective: 53 year old female presents the office they for follow-up evaluation of posterior tibial tendinitis/dysfunction and sinus tarsitis of the left foot. She states that since last appointment she's been continuing to wear the ankle brace during the week while working and she has noticed significant improvement in symptoms. She does get some mild discomfort at times. She does not wear the brace and the weekend however she states that she is able to get around without any discomfort. Denies any acute changes since last appointment. No other complaints at this time. Denies any systemic complaints as fevers, chills, nausea, vomiting.  Objective: AAO x3, NAD DP/PT pulses palpable bilaterally, CRT less than 3 seconds Protective sensation intact with Simms Weinstein monofilament, vibratory sensation intact, Achilles tendon reflex intact On the left foot, there is no tenderness to palpation overlying the lateral aspect of the foot over the sinus tarsi at this time. There is no pain along the course of the posterior tibial tendon or along the insertion into the navicular. There is no tenderness overlying the ankle ligaments or the ankle. Ankle joint range of motion is within normal limits. No pain with range of motion of the subtalar joint. No specific areas of pinpoint bony tenderness or pain with vibratory sensation. Decrease in medial arch height bilaterally with mild equinus. MMT 5/5, ROM WNL No open lesions or pre-ulcerative lesions No pain with calf compression, swelling, warmth, erythema.   Assessment: 53 year old female with resolving left foot posterior tibial tendinitis and sinus tarsitis.  Plan: -Treatment options were discussed including alternatives, risks, complications. -At this time as the patient symptoms appear to be resolving discussed with her to slowly transition out of the brace back into a  regular sneaker with an arch support. Also discussed with her that if she starts increase her walking to gradually do this. I also discussed with her stretching/strengthening exercises of the posterior tibial tendon. She can gradually start doing this. If she has any increasing symptoms while doing either of his stretching exercises or increasing her activity to decrease the activity and continue to wear the brace. Continue to ice the area. -Follow-up as needed. In the meantime, call the office with any questions, concerns, change in symptoms. Follow-up in 4 weeks if symptoms are not completely resolved, or sooner if any increase.

## 2014-03-04 ENCOUNTER — Ambulatory Visit (HOSPITAL_BASED_OUTPATIENT_CLINIC_OR_DEPARTMENT_OTHER): Payer: 59

## 2014-03-04 DIAGNOSIS — C50412 Malignant neoplasm of upper-outer quadrant of left female breast: Secondary | ICD-10-CM

## 2014-03-04 DIAGNOSIS — D0512 Intraductal carcinoma in situ of left breast: Secondary | ICD-10-CM

## 2014-03-04 DIAGNOSIS — Z5111 Encounter for antineoplastic chemotherapy: Secondary | ICD-10-CM

## 2014-03-04 DIAGNOSIS — C50912 Malignant neoplasm of unspecified site of left female breast: Secondary | ICD-10-CM

## 2014-03-04 DIAGNOSIS — C773 Secondary and unspecified malignant neoplasm of axilla and upper limb lymph nodes: Secondary | ICD-10-CM

## 2014-03-04 DIAGNOSIS — C50411 Malignant neoplasm of upper-outer quadrant of right female breast: Secondary | ICD-10-CM

## 2014-03-04 MED ORDER — GOSERELIN ACETATE 3.6 MG ~~LOC~~ IMPL
3.6000 mg | DRUG_IMPLANT | Freq: Once | SUBCUTANEOUS | Status: AC
  Administered 2014-03-04: 3.6 mg via SUBCUTANEOUS
  Filled 2014-03-04: qty 3.6

## 2014-03-04 NOTE — Patient Instructions (Signed)
Goserelin injection What is this medicine? GOSERELIN (GOE se rel in) is similar to a hormone found in the body. It lowers the amount of sex hormones that the body makes. Men will have lower testosterone levels and women will have lower estrogen levels while taking this medicine. In men, this medicine is used to treat prostate cancer; the injection is either given once per month or once every 12 weeks. A once per month injection (only) is used to treat women with endometriosis, dysfunctional uterine bleeding, or advanced breast cancer. This medicine may be used for other purposes; ask your health care provider or pharmacist if you have questions. COMMON BRAND NAME(S): Zoladex What should I tell my health care provider before I take this medicine? They need to know if you have any of these conditions (some only apply to women): -diabetes -heart disease or previous heart attack -high blood pressure -high cholesterol -kidney disease -osteoporosis or low bone density -problems passing urine -spinal cord injury -stroke -tobacco smoker -an unusual or allergic reaction to goserelin, hormone therapy, other medicines, foods, dyes, or preservatives -pregnant or trying to get pregnant -breast-feeding How should I use this medicine? This medicine is for injection under the skin. It is given by a health care professional in a hospital or clinic setting. Men receive this injection once every 4 weeks or once every 12 weeks. Women will only receive the once every 4 weeks injection. Talk to your pediatrician regarding the use of this medicine in children. Special care may be needed. Overdosage: If you think you have taken too much of this medicine contact a poison control center or emergency room at once. NOTE: This medicine is only for you. Do not share this medicine with others. What if I miss a dose? It is important not to miss your dose. Call your doctor or health care professional if you are unable to  keep an appointment. What may interact with this medicine? -female hormones like estrogen -herbal or dietary supplements like black cohosh, chasteberry, or DHEA -female hormones like testosterone -prasterone This list may not describe all possible interactions. Give your health care provider a list of all the medicines, herbs, non-prescription drugs, or dietary supplements you use. Also tell them if you smoke, drink alcohol, or use illegal drugs. Some items may interact with your medicine. What should I watch for while using this medicine? Visit your doctor or health care professional for regular checks on your progress. Your symptoms may appear to get worse during the first weeks of this therapy. Tell your doctor or healthcare professional if your symptoms do not start to get better or if they get worse after this time. Your bones may get weaker if you take this medicine for a long time. If you smoke or frequently drink alcohol you may increase your risk of bone loss. A family history of osteoporosis, chronic use of drugs for seizures (convulsions), or corticosteroids can also increase your risk of bone loss. Talk to your doctor about how to keep your bones strong. This medicine should stop regular monthly menstration in women. Tell your doctor if you continue to menstrate. Women should not become pregnant while taking this medicine or for 12 weeks after stopping this medicine. Women should inform their doctor if they wish to become pregnant or think they might be pregnant. There is a potential for serious side effects to an unborn child. Talk to your health care professional or pharmacist for more information. Do not breast-feed an infant while taking   this medicine. Men should inform their doctors if they wish to father a child. This medicine may lower sperm counts. Talk to your health care professional or pharmacist for more information. What side effects may I notice from receiving this  medicine? Side effects that you should report to your doctor or health care professional as soon as possible: -allergic reactions like skin rash, itching or hives, swelling of the face, lips, or tongue -bone pain -breathing problems -changes in vision -chest pain -feeling faint or lightheaded, falls -fever, chills -pain, swelling, warmth in the leg -pain, tingling, numbness in the hands or feet -signs and symptoms of low blood pressure like dizziness; feeling faint or lightheaded, falls; unusually weak or tired -stomach pain -swelling of the ankles, feet, hands -trouble passing urine or change in the amount of urine -unusually high or low blood pressure -unusually weak or tired Side effects that usually do not require medical attention (report to your doctor or health care professional if they continue or are bothersome): -change in sex drive or performance -changes in breast size in both males and females -changes in emotions or moods -headache -hot flashes -irritation at site where injected -loss of appetite -skin problems like acne, dry skin -vaginal dryness This list may not describe all possible side effects. Call your doctor for medical advice about side effects. You may report side effects to FDA at 1-800-FDA-1088. Where should I keep my medicine? This drug is given in a hospital or clinic and will not be stored at home. NOTE: This sheet is a summary. It may not cover all possible information. If you have questions about this medicine, talk to your doctor, pharmacist, or health care provider.  2015, Elsevier/Gold Standard. (2013-05-14 11:10:35)  

## 2014-03-11 ENCOUNTER — Telehealth: Payer: Self-pay | Admitting: Hematology and Oncology

## 2014-03-11 NOTE — Telephone Encounter (Signed)
DUE TO CALL DAY 2/9 APPTS MOVED TO AM. CALL DAY AS SINCE BEEN CXD AND PT APPT TIMES MOVED BACK TO ORIGINAL TIME OF 1:30PM. LMONVM FOR PT AND PT TO GET NEW SCHEDUL 04/01/14.

## 2014-03-17 ENCOUNTER — Other Ambulatory Visit: Payer: Self-pay | Admitting: *Deleted

## 2014-03-17 DIAGNOSIS — C50412 Malignant neoplasm of upper-outer quadrant of left female breast: Secondary | ICD-10-CM

## 2014-03-17 NOTE — Telephone Encounter (Signed)
Last visit 12/11/13, ok refill?

## 2014-03-18 ENCOUNTER — Other Ambulatory Visit: Payer: Self-pay

## 2014-03-18 DIAGNOSIS — C50412 Malignant neoplasm of upper-outer quadrant of left female breast: Secondary | ICD-10-CM

## 2014-03-18 MED ORDER — LORAZEPAM 0.5 MG PO TABS: ORAL_TABLET | ORAL | Status: DC

## 2014-03-18 NOTE — Telephone Encounter (Signed)
LMOVM - prescription for ativan called in to [facility].  Pt to call clinic with questions.

## 2014-04-01 ENCOUNTER — Telehealth: Payer: Self-pay | Admitting: Hematology and Oncology

## 2014-04-01 ENCOUNTER — Ambulatory Visit: Payer: 59

## 2014-04-01 ENCOUNTER — Telehealth: Payer: Self-pay

## 2014-04-01 NOTE — Telephone Encounter (Signed)
Returned pt call re: zoladex r/s to [facility] 1/18.  Confirmed ok to delay shot till next wk. Pt voiced understanding.

## 2014-04-01 NOTE — Telephone Encounter (Signed)
, °

## 2014-04-07 ENCOUNTER — Telehealth: Payer: Self-pay | Admitting: Hematology and Oncology

## 2014-04-07 ENCOUNTER — Ambulatory Visit: Payer: 59

## 2014-04-07 NOTE — Telephone Encounter (Signed)
, °

## 2014-04-08 ENCOUNTER — Ambulatory Visit (HOSPITAL_BASED_OUTPATIENT_CLINIC_OR_DEPARTMENT_OTHER): Payer: 59

## 2014-04-08 DIAGNOSIS — C50412 Malignant neoplasm of upper-outer quadrant of left female breast: Secondary | ICD-10-CM

## 2014-04-08 DIAGNOSIS — C50912 Malignant neoplasm of unspecified site of left female breast: Secondary | ICD-10-CM

## 2014-04-08 DIAGNOSIS — Z5111 Encounter for antineoplastic chemotherapy: Secondary | ICD-10-CM

## 2014-04-08 MED ORDER — GOSERELIN ACETATE 3.6 MG ~~LOC~~ IMPL
3.6000 mg | DRUG_IMPLANT | Freq: Once | SUBCUTANEOUS | Status: AC
  Administered 2014-04-08: 3.6 mg via SUBCUTANEOUS
  Filled 2014-04-08: qty 3.6

## 2014-04-28 ENCOUNTER — Other Ambulatory Visit: Payer: Self-pay | Admitting: *Deleted

## 2014-04-28 ENCOUNTER — Ambulatory Visit
Admission: RE | Admit: 2014-04-28 | Discharge: 2014-04-28 | Disposition: A | Discharge status: Home / Self Care | Payer: 59 | Source: Ambulatory Visit | Attending: Hematology and Oncology | Admitting: Hematology and Oncology

## 2014-04-28 DIAGNOSIS — C50419 Malignant neoplasm of upper-outer quadrant of unspecified female breast: Secondary | ICD-10-CM

## 2014-04-28 DIAGNOSIS — N951 Menopausal and female climacteric states: Secondary | ICD-10-CM

## 2014-04-28 DIAGNOSIS — C50412 Malignant neoplasm of upper-outer quadrant of left female breast: Secondary | ICD-10-CM

## 2014-04-29 ENCOUNTER — Ambulatory Visit (HOSPITAL_BASED_OUTPATIENT_CLINIC_OR_DEPARTMENT_OTHER): Payer: 59

## 2014-04-29 ENCOUNTER — Ambulatory Visit: Payer: 59

## 2014-04-29 ENCOUNTER — Other Ambulatory Visit (HOSPITAL_BASED_OUTPATIENT_CLINIC_OR_DEPARTMENT_OTHER): Payer: 59

## 2014-04-29 ENCOUNTER — Ambulatory Visit: Payer: 59 | Admitting: Hematology and Oncology

## 2014-04-29 ENCOUNTER — Ambulatory Visit (HOSPITAL_BASED_OUTPATIENT_CLINIC_OR_DEPARTMENT_OTHER): Payer: 59 | Admitting: Hematology and Oncology

## 2014-04-29 ENCOUNTER — Telehealth: Payer: Self-pay | Admitting: Hematology and Oncology

## 2014-04-29 ENCOUNTER — Other Ambulatory Visit: Payer: 59

## 2014-04-29 VITALS — BP 171/99 | HR 86 | Temp 98.0°F | Resp 19 | Ht 64.0 in | Wt 170.3 lb

## 2014-04-29 DIAGNOSIS — C50412 Malignant neoplasm of upper-outer quadrant of left female breast: Secondary | ICD-10-CM

## 2014-04-29 DIAGNOSIS — C50419 Malignant neoplasm of upper-outer quadrant of unspecified female breast: Secondary | ICD-10-CM

## 2014-04-29 DIAGNOSIS — C50911 Malignant neoplasm of unspecified site of right female breast: Secondary | ICD-10-CM

## 2014-04-29 DIAGNOSIS — Z5111 Encounter for antineoplastic chemotherapy: Secondary | ICD-10-CM

## 2014-04-29 DIAGNOSIS — C50912 Malignant neoplasm of unspecified site of left female breast: Secondary | ICD-10-CM

## 2014-04-29 DIAGNOSIS — D0512 Intraductal carcinoma in situ of left breast: Secondary | ICD-10-CM

## 2014-04-29 LAB — COMPREHENSIVE METABOLIC PANEL (CC13)
ALBUMIN: 4.1 g/dL (ref 3.5–5.0)
ALT: 30 U/L (ref 0–55)
AST: 21 U/L (ref 5–34)
Alkaline Phosphatase: 124 U/L (ref 40–150)
Anion Gap: 13 mEq/L — ABNORMAL HIGH (ref 3–11)
BUN: 15.7 mg/dL (ref 7.0–26.0)
CHLORIDE: 104 meq/L (ref 98–109)
CO2: 24 mEq/L (ref 22–29)
CREATININE: 0.9 mg/dL (ref 0.6–1.1)
Calcium: 9.7 mg/dL (ref 8.4–10.4)
EGFR: 75 mL/min/{1.73_m2} — ABNORMAL LOW (ref >90)
GLUCOSE: 97 mg/dL (ref 70–140)
POTASSIUM: 4 meq/L (ref 3.5–5.1)
Sodium: 141 mEq/L (ref 136–145)
Total Bilirubin: 0.34 mg/dL (ref 0.20–1.20)
Total Protein: 7.6 g/dL (ref 6.4–8.3)

## 2014-04-29 LAB — CBC WITH DIFFERENTIAL/PLATELET
BASO%: 0.5 % (ref 0.0–2.0)
BASOS ABS: 0 10*3/uL (ref 0.0–0.1)
EOS%: 2 % (ref 0.0–7.0)
Eosinophils Absolute: 0.1 10*3/uL (ref 0.0–0.5)
HEMATOCRIT: 43.8 % (ref 34.8–46.6)
HEMOGLOBIN: 14.8 g/dL (ref 11.6–15.9)
LYMPH%: 27.4 % (ref 14.0–49.7)
MCH: 32.1 pg (ref 25.1–34.0)
MCHC: 33.8 g/dL (ref 31.5–36.0)
MCV: 95 fL (ref 79.5–101.0)
MONO#: 0.6 10*3/uL (ref 0.1–0.9)
MONO%: 8.8 % (ref 0.0–14.0)
NEUT#: 3.9 10*3/uL (ref 1.5–6.5)
NEUT%: 61.3 % (ref 38.4–76.8)
PLATELETS: 206 10*3/uL (ref 145–400)
RBC: 4.61 10*6/uL (ref 3.70–5.45)
RDW: 12.4 % (ref 11.2–14.5)
WBC: 6.4 10*3/uL (ref 3.9–10.3)
lymph#: 1.7 10*3/uL (ref 0.9–3.3)

## 2014-04-29 MED ORDER — GOSERELIN ACETATE 3.6 MG ~~LOC~~ IMPL
3.6000 mg | DRUG_IMPLANT | Freq: Once | SUBCUTANEOUS | Status: AC
  Administered 2014-04-29: 3.6 mg via SUBCUTANEOUS
  Filled 2014-04-29: qty 3.6

## 2014-04-29 NOTE — Assessment & Plan Note (Addendum)
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 Occasional hot flashes and night sweats   Recommendation: 1. We will continue zolodex injections monthly 2. Continue hormonal therapy We cannot determine whether she is menopausal or not because she is currently on Zoladex injections. Since she is only 54 years old, it is unlikely that she would be menopausal at this age hence I recommended continuation of the same treatment with Zoladex and antiestrogen therapy.  Breast cancer surveillance: 1. No role of imaging studies since she had bilateral mastectomies 2. Chest wall and axillary examination 04/29/2014 is normal  Return to clinic in 6 months for follow-up

## 2014-04-29 NOTE — Progress Notes (Signed)
Patient Care Team: [name], MD as PCP - General (Internal Medicine)  DIAGNOSIS: Primary cancer of upper outer quadrant of left female breast   Staging form: Breast, AJCC 7th Edition     Clinical stage from 11/23/2011: Stage IIIA (T3, N1, cM0) - Signed by [name], MD on 11/23/2011     Pathologic: No stage assigned - Unsigned   SUMMARY OF ONCOLOGIC HISTORY:   Primary cancer of upper outer quadrant of left female breast   11/17/2011 Initial Diagnosis Cancer of upper-outer quadrant of female breast diagnosed when pt presented with Nipple flattening and palpable lesion.   12/08/2011 - 03/29/2012 Chemotherapy Neo adjuvant FEC X 4 followed by Weekly Taxol X 12   05/18/2012 Surgery Bilateral Mastectomies: Right revealed invasive  Lobular carcinoma measuring 2.3 cm 8 of 17 lymph nodes were positive (T2 N2A). 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   07/04/2012 - 08/17/2012 Radiation Therapy Rt Breast irradiation   08/19/2012 -  Anti-estrogen oral therapy Tamoxifen 20 mg daily + Zolodex Monthly    CHIEF COMPLIANT: F/U breast cancer  INTERVAL HISTORY: Mary [name] is a 54 old  lady with above-mentioned history of breast cancer who underwent bilateral mastectomies followed by radiation and she is currently on adjuvant hormone therapy with tamoxifen with Zoladex monthly. She appears to be tolerating his treatment extremely well from occasional hot flashes. Denies any other complaints today.  REVIEW OF SYSTEMS:   Constitutional: Denies fevers, chills or abnormal weight loss Eyes: Denies blurriness of vision Ears, nose, mouth, throat, and face: Denies mucositis or sore throat Respiratory: Denies cough, dyspnea or wheezes Cardiovascular: Denies palpitation, chest discomfort or lower extremity swelling Gastrointestinal:  Denies nausea, heartburn or change in bowel habits Skin: Denies abnormal skin rashes Lymphatics: Denies new lymphadenopathy or easy  bruising Neurological:Denies numbness, tingling or new weaknesses Behavioral/Psych: Mood is stable, no new changes  Breast:  denies any pain or lumps or nodules in either chest wall All other systems were reviewed with the patient and are negative.  I have reviewed the past medical history, past surgical history, social history and family history with the patient and they are unchanged from previous note.  ALLERGIES:  is allergic to adhesive.  MEDICATIONS:  Current Outpatient Prescriptions  Medication Sig Dispense Refill  . ALPRAZolam (XANAX) 0.25 MG tablet TAKE ONE TABLET BY MOUTH TWICE DAILY AS NEEDED FOR ANXIETY 60 tablet 4  . amLODipine (NORVASC) 5 MG tablet Take 1 tablet (5 mg total) by mouth daily. 30 tablet 3  . Ascorbic Acid (VITAMIN C) 100 MG tablet Take 100 mg by mouth daily.      . Calcium Carbonate-Vitamin D (CALTRATE 600+D PO) Take 1 tablet by mouth daily.    . cholecalciferol (VITAMIN D) 400 UNITS TABS Take 1,000 Units by mouth daily.     Marland Kitchen escitalopram (LEXAPRO) 5 MG tablet Take 1 tablet (5 mg total) by mouth daily. 30 tablet 2  . esomeprazole (NEXIUM) 40 MG capsule Take 40 mg by mouth daily before breakfast.    . exemestane (AROMASIN) 25 MG tablet Take 1 tablet (25 mg total) by mouth daily after breakfast. 30 tablet 12  . goserelin (ZOLADEX) 3.6 MG injection Inject 3.6 mg into the skin every 28 (twenty-eight) days.    Marland Kitchen loperamide (IMODIUM A-D) 2 MG tablet Take 2 mg by mouth 4 (four) times daily as needed for diarrhea or loose stools.     Marland Kitchen LORazepam (ATIVAN) 0.5 MG  tablet TAKE ONE TABLET BY MOUTH EVERY 8 HOURS 60 tablet 3  . Multiple Vitamin (MULTIVITAMIN) tablet Take 1 tablet by mouth daily.       No current facility-administered medications for this visit.    PHYSICAL EXAMINATION: ECOG PERFORMANCE STATUS: 1 - Symptomatic but completely ambulatory  Filed Vitals:   04/29/14 1349  BP: 171/99  Pulse: 86  Temp: 98 F (36.7 C)  Resp: 19   Filed Weights   04/29/14  1349  Weight: 170 lb 4.8 oz (77.248 kg)    GENERAL:alert, no distress and comfortable SKIN: skin color, texture, turgor are normal, no rashes or significant lesions EYES: normal, Conjunctiva are pink and non-injected, sclera clear OROPHARYNX:no exudate, no erythema and lips, buccal mucosa, and tongue normal  NECK: supple, thyroid normal size, non-tender, without nodularity LYMPH:  no palpable lymphadenopathy in the cervical, axillary or inguinal LUNGS: clear to auscultation and percussion with normal breathing effort HEART: regular rate & rhythm and no murmurs and no lower extremity edema ABDOMEN:abdomen soft, non-tender and normal bowel sounds Musculoskeletal:no cyanosis of digits and no clubbing  NEURO: alert & oriented x 3 with fluent speech, no focal motor/sensory deficits BREAST:palpable masses in the chest wall axillary jet of any lymphadenopathy.(exam performed in the presence of a chaperone)  LABORATORY DATA:  I have reviewed the data as listed   Chemistry      Component Value Date/Time   NA 141 04/29/2014 1327   NA 139 12/06/2013 1602   K 4.0 04/29/2014 1327   K 4.2 12/06/2013 1602   CL 103 12/06/2013 1602   CL 106 08/20/2012 0802   CO2 24 04/29/2014 1327   CO2 25 12/06/2013 1602   BUN 15.7 04/29/2014 1327   BUN 13 12/06/2013 1602   CREATININE 0.9 04/29/2014 1327   CREATININE 0.84 12/06/2013 1602   CREATININE 0.75 10/28/2013 1339      Component Value Date/Time   CALCIUM 9.7 04/29/2014 1327   CALCIUM 9.3 12/06/2013 1602   ALKPHOS 124 04/29/2014 1327   ALKPHOS 91 12/06/2013 1602   AST 21 04/29/2014 1327   AST 25 12/06/2013 1602   ALT 30 04/29/2014 1327   ALT 26 12/06/2013 1602   BILITOT 0.34 04/29/2014 1327   BILITOT 0.4 12/06/2013 1602       Lab Results  Component Value Date   WBC 6.4 04/29/2014   HGB 14.8 04/29/2014   HCT 43.8 04/29/2014   MCV 95.0 04/29/2014   PLT 206 04/29/2014   NEUTROABS 3.9 04/29/2014     RADIOGRAPHIC STUDIES: I have  personally reviewed the radiology reports and agreed with their findings. Dg Bone Density  04/28/2014   CLINICAL DATA:  54 year old postmenopausal female.  EXAM: DUAL X-RAY ABSORPTIOMETRY (DXA) FOR BONE MINERAL DENSITY  FINDINGS: AP LUMBAR SPINE L1, L2, L4  Bone Mineral Density (BMD):  1.011 g/cm2  Young Adult T-Score:  -0.2  Z-Score:  0.7  LEFT FEMUR NECK  Bone Mineral Density (BMD):  0.816 g/cm2  Young Adult T-Score: -0.3  Z-Score:  0.6  ASSESSMENT: Patient's diagnostic category is normal by WHO Criteria.  FRACTURE RISK: Not increased  COMPARISON: None.  Effective therapies are available in the form of bisphosphonates, selective estrogen receptor modulators, biologic agents, and hormone replacement therapy (for women). All patients should ensure an adequate intake of dietary calcium (1200 mg daily) and vitamin D (800 IU daily) unless contraindicated.  All treatment decisions require clinical judgment and consideration of individual patient factors, including patient preferences, co-morbidities, previous drug use, risk  factors not captured in the FRAX model (e.g., frailty, falls, vitamin D deficiency, increased bone turnover, interval significant decline in bone density) and possible under- or over-estimation of fracture risk by FRAX.  The National Osteoporosis Foundation recommends that FDA-approved medical therapies be considered in postmenopausal women and men age 72 or older with a:  1. Hip or vertebral (clinical or morphometric) fracture.  2. T-score of -2.5 or lower at the spine or hip.  3. Ten-year fracture probability by FRAX of 3% or greater for hip fracture or 20% or greater for major osteoporotic fracture.  People with diagnosed cases of osteoporosis or at high risk for fracture should have regular bone mineral density tests. For patients eligible for Medicare, routine testing is allowed once every 2 years. The testing frequency can be increased to one year for patients who have rapidly progressing  disease, those who are receiving or discontinuing medical therapy to restore bone mass, or have additional risk factors.  World Pharmacologist [facility]) Criteria:  Normal: T-scores from +1.0 to -1.0  Low Bone Mass (Osteopenia): T-scores between -1.0 and -2.5  Osteoporosis: T-scores -2.5 and below  Comparison to Reference Population:  T-score is the key measure used in the diagnosis of osteoporosis and relative risk determination for fracture. It provides a value for bone mass relative to the mean bone mass of a young adult reference population expressed in terms of standard deviation (SD).  Z-score is the age-matched score showing the patient's values compared to a population matched for age, sex, and race. This is also expressed in terms of standard deviation. The patient may have values that compare favorably to the age-matched values and still be at increased risk for fracture.   Electronically Signed   By: Lovey Newcomer M.D.   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 Occasional hot flashes and night sweats  2. Bone density showed T score -0.2 and -0.3.  Recommendation: 1. We will continue zolodex injections monthly 2. Continue hormonal therapy We cannot determine whether she is menopausal or not because she is currently on Zoladex injections. Since she is only 54 years old, it is unlikely that she would be menopausal at this age hence I recommended continuation of the same treatment with Zoladex and antiestrogen therapy.  Breast  cancer surveillance: 1. No role of imaging studies since she had bilateral mastectomies 2. Chest wall and axillary examination 04/29/2014 is normal  Return to clinic in 6 months for follow-up     No orders of the defined types were placed in this encounter.   The patient has a good understanding of the overall plan. she agrees with it. She will call with any problems that may develop before her next visit here.   Rulon Eisenmenger, MD

## 2014-04-29 NOTE — Telephone Encounter (Signed)
per pof to sch pt appt-gave pt copy of sch °

## 2014-05-01 ENCOUNTER — Other Ambulatory Visit: Payer: 59

## 2014-05-01 ENCOUNTER — Ambulatory Visit: Payer: 59 | Admitting: Hematology and Oncology

## 2014-05-27 ENCOUNTER — Ambulatory Visit (HOSPITAL_BASED_OUTPATIENT_CLINIC_OR_DEPARTMENT_OTHER): Payer: 59

## 2014-05-27 DIAGNOSIS — C50912 Malignant neoplasm of unspecified site of left female breast: Secondary | ICD-10-CM

## 2014-05-27 DIAGNOSIS — C50911 Malignant neoplasm of unspecified site of right female breast: Secondary | ICD-10-CM

## 2014-05-27 DIAGNOSIS — D0512 Intraductal carcinoma in situ of left breast: Secondary | ICD-10-CM

## 2014-05-27 DIAGNOSIS — Z5111 Encounter for antineoplastic chemotherapy: Secondary | ICD-10-CM

## 2014-05-27 DIAGNOSIS — C50412 Malignant neoplasm of upper-outer quadrant of left female breast: Secondary | ICD-10-CM

## 2014-05-27 MED ORDER — GOSERELIN ACETATE 3.6 MG ~~LOC~~ IMPL
3.6000 mg | DRUG_IMPLANT | Freq: Once | SUBCUTANEOUS | Status: AC
  Administered 2014-05-27: 3.6 mg via SUBCUTANEOUS
  Filled 2014-05-27: qty 3.6

## 2014-06-10 ENCOUNTER — Telehealth: Payer: Self-pay | Admitting: Hematology and Oncology

## 2014-06-10 NOTE — Telephone Encounter (Signed)
Left message with new dr Lindi Adie appt,per Con Memos it is ok to do inj early

## 2014-06-24 ENCOUNTER — Ambulatory Visit: Payer: 59

## 2014-06-24 ENCOUNTER — Telehealth: Payer: Self-pay | Admitting: Hematology and Oncology

## 2014-06-24 NOTE — Telephone Encounter (Signed)
Patient called in to reschedule her inj appointment

## 2014-06-27 ENCOUNTER — Ambulatory Visit (HOSPITAL_BASED_OUTPATIENT_CLINIC_OR_DEPARTMENT_OTHER): Payer: 59

## 2014-06-27 DIAGNOSIS — C773 Secondary and unspecified malignant neoplasm of axilla and upper limb lymph nodes: Secondary | ICD-10-CM

## 2014-06-27 DIAGNOSIS — Z5111 Encounter for antineoplastic chemotherapy: Secondary | ICD-10-CM

## 2014-06-27 DIAGNOSIS — C50412 Malignant neoplasm of upper-outer quadrant of left female breast: Secondary | ICD-10-CM

## 2014-06-27 DIAGNOSIS — C50411 Malignant neoplasm of upper-outer quadrant of right female breast: Secondary | ICD-10-CM

## 2014-06-27 DIAGNOSIS — C50912 Malignant neoplasm of unspecified site of left female breast: Secondary | ICD-10-CM

## 2014-06-27 MED ORDER — GOSERELIN ACETATE 3.6 MG ~~LOC~~ IMPL
3.6000 mg | DRUG_IMPLANT | Freq: Once | SUBCUTANEOUS | Status: AC
  Administered 2014-06-27: 3.6 mg via SUBCUTANEOUS
  Filled 2014-06-27: qty 3.6

## 2014-07-08 ENCOUNTER — Other Ambulatory Visit: Payer: Self-pay | Admitting: Oncology

## 2014-07-08 NOTE — Telephone Encounter (Signed)
Last OV 04/29/14.  Next OV 10/28/14.  Chart reviewed.

## 2014-07-22 ENCOUNTER — Telehealth: Payer: Self-pay | Admitting: *Deleted

## 2014-07-22 ENCOUNTER — Ambulatory Visit (HOSPITAL_BASED_OUTPATIENT_CLINIC_OR_DEPARTMENT_OTHER): Payer: 59

## 2014-07-22 VITALS — BP 146/86 | HR 83 | Temp 98.2°F | Resp 18

## 2014-07-22 DIAGNOSIS — Z5111 Encounter for antineoplastic chemotherapy: Secondary | ICD-10-CM | POA: Diagnosis not present

## 2014-07-22 DIAGNOSIS — C773 Secondary and unspecified malignant neoplasm of axilla and upper limb lymph nodes: Secondary | ICD-10-CM

## 2014-07-22 DIAGNOSIS — C50411 Malignant neoplasm of upper-outer quadrant of right female breast: Secondary | ICD-10-CM

## 2014-07-22 DIAGNOSIS — C50912 Malignant neoplasm of unspecified site of left female breast: Secondary | ICD-10-CM

## 2014-07-22 DIAGNOSIS — C50412 Malignant neoplasm of upper-outer quadrant of left female breast: Secondary | ICD-10-CM

## 2014-07-22 MED ORDER — GOSERELIN ACETATE 3.6 MG ~~LOC~~ IMPL
3.6000 mg | DRUG_IMPLANT | Freq: Once | SUBCUTANEOUS | Status: AC
  Administered 2014-07-22: 3.6 mg via SUBCUTANEOUS
  Filled 2014-07-22: qty 3.6

## 2014-07-22 NOTE — Telephone Encounter (Signed)
Call received from [place] requesting status of FMLA extension faxed by [employer].  Fax was received at [phone], forwarded to [employer].  Should be ready to be faxed to [employer] by 07-24-2014 per Raquel Browning.

## 2014-07-22 NOTE — Patient Instructions (Signed)
Goserelin injection What is this medicine? GOSERELIN (GOE se rel in) is similar to a hormone found in the body. It lowers the amount of sex hormones that the body makes. Men will have lower testosterone levels and women will have lower estrogen levels while taking this medicine. In men, this medicine is used to treat prostate cancer; the injection is either given once per month or once every 12 weeks. A once per month injection (only) is used to treat women with endometriosis, dysfunctional uterine bleeding, or advanced breast cancer. This medicine may be used for other purposes; ask your health care provider or pharmacist if you have questions. COMMON BRAND NAME(S): Zoladex What should I tell my health care provider before I take this medicine? They need to know if you have any of these conditions (some only apply to women): -diabetes -heart disease or previous heart attack -high blood pressure -high cholesterol -kidney disease -osteoporosis or low bone density -problems passing urine -spinal cord injury -stroke -tobacco smoker -an unusual or allergic reaction to goserelin, hormone therapy, other medicines, foods, dyes, or preservatives -pregnant or trying to get pregnant -breast-feeding How should I use this medicine? This medicine is for injection under the skin. It is given by a health care professional in a hospital or clinic setting. Men receive this injection once every 4 weeks or once every 12 weeks. Women will only receive the once every 4 weeks injection. Talk to your pediatrician regarding the use of this medicine in children. Special care may be needed. Overdosage: If you think you have taken too much of this medicine contact a poison control center or emergency room at once. NOTE: This medicine is only for you. Do not share this medicine with others. What if I miss a dose? It is important not to miss your dose. Call your doctor or health care professional if you are unable to  keep an appointment. What may interact with this medicine? -female hormones like estrogen -herbal or dietary supplements like black cohosh, chasteberry, or DHEA -female hormones like testosterone -prasterone This list may not describe all possible interactions. Give your health care provider a list of all the medicines, herbs, non-prescription drugs, or dietary supplements you use. Also tell them if you smoke, drink alcohol, or use illegal drugs. Some items may interact with your medicine. What should I watch for while using this medicine? Visit your doctor or health care professional for regular checks on your progress. Your symptoms may appear to get worse during the first weeks of this therapy. Tell your doctor or healthcare professional if your symptoms do not start to get better or if they get worse after this time. Your bones may get weaker if you take this medicine for a long time. If you smoke or frequently drink alcohol you may increase your risk of bone loss. A family history of osteoporosis, chronic use of drugs for seizures (convulsions), or corticosteroids can also increase your risk of bone loss. Talk to your doctor about how to keep your bones strong. This medicine should stop regular monthly menstration in women. Tell your doctor if you continue to menstrate. Women should not become pregnant while taking this medicine or for 12 weeks after stopping this medicine. Women should inform their doctor if they wish to become pregnant or think they might be pregnant. There is a potential for serious side effects to an unborn child. Talk to your health care professional or pharmacist for more information. Do not breast-feed an infant while taking   this medicine. Men should inform their doctors if they wish to father a child. This medicine may lower sperm counts. Talk to your health care professional or pharmacist for more information. What side effects may I notice from receiving this  medicine? Side effects that you should report to your doctor or health care professional as soon as possible: -allergic reactions like skin rash, itching or hives, swelling of the face, lips, or tongue -bone pain -breathing problems -changes in vision -chest pain -feeling faint or lightheaded, falls -fever, chills -pain, swelling, warmth in the leg -pain, tingling, numbness in the hands or feet -signs and symptoms of low blood pressure like dizziness; feeling faint or lightheaded, falls; unusually weak or tired -stomach pain -swelling of the ankles, feet, hands -trouble passing urine or change in the amount of urine -unusually high or low blood pressure -unusually weak or tired Side effects that usually do not require medical attention (report to your doctor or health care professional if they continue or are bothersome): -change in sex drive or performance -changes in breast size in both males and females -changes in emotions or moods -headache -hot flashes -irritation at site where injected -loss of appetite -skin problems like acne, dry skin -vaginal dryness This list may not describe all possible side effects. Call your doctor for medical advice about side effects. You may report side effects to FDA at 1-800-FDA-1088. Where should I keep my medicine? This drug is given in a hospital or clinic and will not be stored at home. NOTE: This sheet is a summary. It may not cover all possible information. If you have questions about this medicine, talk to your doctor, pharmacist, or health care provider.  2015, Elsevier/Gold Standard. (2013-05-14 11:10:35)  

## 2014-07-23 ENCOUNTER — Encounter: Payer: Self-pay | Admitting: Hematology and Oncology

## 2014-07-23 NOTE — Progress Notes (Signed)
I placed fmla forms on the desk of nurse for dr. Lindi Adie.

## 2014-07-25 ENCOUNTER — Encounter: Payer: Self-pay | Admitting: Hematology and Oncology

## 2014-07-25 NOTE — Progress Notes (Signed)
Per patient wants call once done at [phone] to let her know and I will fax to her [phone] and [employer].

## 2014-07-29 ENCOUNTER — Encounter: Payer: Self-pay | Admitting: Hematology and Oncology

## 2014-07-29 NOTE — Progress Notes (Signed)
I left message on cell ph to advise the patient faxed her copy to her at [phone] and to [employer] [phone]

## 2014-08-01 ENCOUNTER — Telehealth: Payer: Self-pay | Admitting: *Deleted

## 2014-08-01 NOTE — Telephone Encounter (Signed)
Voicemail from patient "I called [facility] in [place] [phone]) Sunday requesting refill for Lorazepam and reaching out to have this refilled.  I can be reached at [phone]."

## 2014-08-04 ENCOUNTER — Other Ambulatory Visit: Payer: Self-pay

## 2014-08-04 DIAGNOSIS — C50412 Malignant neoplasm of upper-outer quadrant of left female breast: Secondary | ICD-10-CM

## 2014-08-04 MED ORDER — LORAZEPAM 0.5 MG PO TABS: ORAL_TABLET | ORAL | Status: DC

## 2014-08-19 ENCOUNTER — Ambulatory Visit (HOSPITAL_BASED_OUTPATIENT_CLINIC_OR_DEPARTMENT_OTHER): Payer: 59

## 2014-08-19 VITALS — BP 145/78 | HR 73 | Temp 98.7°F

## 2014-08-19 DIAGNOSIS — C50411 Malignant neoplasm of upper-outer quadrant of right female breast: Secondary | ICD-10-CM

## 2014-08-19 DIAGNOSIS — Z5111 Encounter for antineoplastic chemotherapy: Secondary | ICD-10-CM | POA: Diagnosis not present

## 2014-08-19 DIAGNOSIS — C50412 Malignant neoplasm of upper-outer quadrant of left female breast: Secondary | ICD-10-CM

## 2014-08-19 DIAGNOSIS — C773 Secondary and unspecified malignant neoplasm of axilla and upper limb lymph nodes: Secondary | ICD-10-CM | POA: Diagnosis not present

## 2014-08-19 DIAGNOSIS — C50912 Malignant neoplasm of unspecified site of left female breast: Secondary | ICD-10-CM

## 2014-08-19 MED ORDER — GOSERELIN ACETATE 3.6 MG ~~LOC~~ IMPL
3.6000 mg | DRUG_IMPLANT | Freq: Once | SUBCUTANEOUS | Status: AC
  Administered 2014-08-19: 3.6 mg via SUBCUTANEOUS
  Filled 2014-08-19: qty 3.6

## 2014-09-01 ENCOUNTER — Other Ambulatory Visit: Payer: Self-pay | Admitting: Internal Medicine

## 2014-09-16 ENCOUNTER — Ambulatory Visit (HOSPITAL_BASED_OUTPATIENT_CLINIC_OR_DEPARTMENT_OTHER): Payer: 59

## 2014-09-16 VITALS — BP 156/84 | HR 87 | Temp 98.3°F | Resp 20

## 2014-09-16 DIAGNOSIS — C50912 Malignant neoplasm of unspecified site of left female breast: Secondary | ICD-10-CM

## 2014-09-16 DIAGNOSIS — D0512 Intraductal carcinoma in situ of left breast: Secondary | ICD-10-CM | POA: Diagnosis not present

## 2014-09-16 DIAGNOSIS — C50412 Malignant neoplasm of upper-outer quadrant of left female breast: Secondary | ICD-10-CM

## 2014-09-16 DIAGNOSIS — Z5111 Encounter for antineoplastic chemotherapy: Secondary | ICD-10-CM

## 2014-09-16 DIAGNOSIS — C50911 Malignant neoplasm of unspecified site of right female breast: Secondary | ICD-10-CM

## 2014-09-16 MED ORDER — GOSERELIN ACETATE 3.6 MG ~~LOC~~ IMPL
3.6000 mg | DRUG_IMPLANT | Freq: Once | SUBCUTANEOUS | Status: AC
  Administered 2014-09-16: 3.6 mg via SUBCUTANEOUS
  Filled 2014-09-16: qty 3.6

## 2014-09-19 ENCOUNTER — Ambulatory Visit (INDEPENDENT_AMBULATORY_CARE_PROVIDER_SITE_OTHER): Payer: 59 | Admitting: Internal Medicine

## 2014-09-19 ENCOUNTER — Encounter: Payer: Self-pay | Admitting: Internal Medicine

## 2014-09-19 ENCOUNTER — Other Ambulatory Visit (HOSPITAL_COMMUNITY)
Admission: RE | Admit: 2014-09-19 | Discharge: 2014-09-19 | Disposition: A | Discharge status: Home / Self Care | Payer: 59 | Source: Ambulatory Visit | Attending: Internal Medicine | Admitting: Internal Medicine

## 2014-09-19 VITALS — BP 140/80 | HR 80 | Temp 98.3°F | Resp 14 | Ht 64.0 in | Wt 165.2 lb

## 2014-09-19 DIAGNOSIS — R5383 Other fatigue: Secondary | ICD-10-CM | POA: Diagnosis not present

## 2014-09-19 DIAGNOSIS — Z01419 Encounter for gynecological examination (general) (routine) without abnormal findings: Secondary | ICD-10-CM | POA: Diagnosis present

## 2014-09-19 DIAGNOSIS — E559 Vitamin D deficiency, unspecified: Secondary | ICD-10-CM

## 2014-09-19 DIAGNOSIS — Z1151 Encounter for screening for human papillomavirus (HPV): Secondary | ICD-10-CM | POA: Diagnosis present

## 2014-09-19 DIAGNOSIS — Z124 Encounter for screening for malignant neoplasm of cervix: Secondary | ICD-10-CM

## 2014-09-19 DIAGNOSIS — E785 Hyperlipidemia, unspecified: Secondary | ICD-10-CM | POA: Diagnosis not present

## 2014-09-19 DIAGNOSIS — Z1211 Encounter for screening for malignant neoplasm of colon: Secondary | ICD-10-CM

## 2014-09-19 DIAGNOSIS — Z1159 Encounter for screening for other viral diseases: Secondary | ICD-10-CM | POA: Diagnosis not present

## 2014-09-19 DIAGNOSIS — I10 Essential (primary) hypertension: Secondary | ICD-10-CM

## 2014-09-19 DIAGNOSIS — Z0000 Encounter for general adult medical examination without abnormal findings: Secondary | ICD-10-CM

## 2014-09-19 LAB — COMPREHENSIVE METABOLIC PANEL
ALT: 21 U/L (ref 0–35)
AST: 18 U/L (ref 0–37)
Albumin: 4.3 g/dL (ref 3.5–5.2)
Alkaline Phosphatase: 108 U/L (ref 39–117)
BUN: 16 mg/dL (ref 6–23)
CHLORIDE: 103 meq/L (ref 96–112)
CO2: 26 meq/L (ref 19–32)
Calcium: 9.7 mg/dL (ref 8.4–10.5)
Creatinine, Ser: 0.93 mg/dL (ref 0.40–1.20)
GFR: 67.08 mL/min (ref >60.00)
Glucose, Bld: 111 mg/dL — ABNORMAL HIGH (ref 70–99)
Potassium: 3.7 mEq/L (ref 3.5–5.1)
Sodium: 141 mEq/L (ref 135–145)
Total Bilirubin: 0.6 mg/dL (ref 0.2–1.2)
Total Protein: 7.7 g/dL (ref 6.0–8.3)

## 2014-09-19 LAB — LIPID PANEL
CHOL/HDL RATIO: 3
Cholesterol: 217 mg/dL — ABNORMAL HIGH (ref 0–200)
HDL: 63 mg/dL (ref >39.00)
LDL CALC: 123 mg/dL — AB (ref 0–99)
NonHDL: 154
TRIGLYCERIDES: 155 mg/dL — AB (ref 0.0–149.0)
VLDL: 31 mg/dL (ref 0.0–40.0)

## 2014-09-19 LAB — VITAMIN D 25 HYDROXY (VIT D DEFICIENCY, FRACTURES): VITD: 45.62 ng/mL (ref 30.00–100.00)

## 2014-09-19 LAB — TSH: TSH: 2.59 u[IU]/mL (ref 0.35–4.50)

## 2014-09-19 MED ORDER — AMLODIPINE BESYLATE 5 MG PO TABS: 5.0000 mg | ORAL_TABLET | Freq: Every day | ORAL | Status: DC

## 2014-09-19 NOTE — Progress Notes (Signed)
**Note Mary-Identified via Obfuscation** Patient ID: Mary [name], female    DOB: March 01, 1961  Age: 54 y.o. MRN: [id number]  The patient is here for annual GYN/ wellness examination and management of other chronic and acute problems.   She has been receiving treatment for breast cancer of the right breast since 2013 and has had no recurrence.   The risk factors are reflected in the social history.  The roster of all physicians providing medical care to patient - is listed in the Snapshot section of the chart.  Activities of daily living:  The patient is 100% independent in all ADLs: dressing, toileting, feeding as well as independent mobility  Home safety : The patient has smoke detectors in the home. They wear seatbelts.  There are no firearms at home. There is no violence in the home.   There is no risks for hepatitis, STDs or HIV. There is no   history of blood transfusion. They have no travel history to infectious disease endemic areas of the world.  The patient has seen their dentist in the last six month. They have seen their eye doctor in the last year. They admit to slight hearing difficulty with regard to whispered voices and some television programs.  They have deferred audiologic testing in the last year.  They do not  have excessive sun exposure. Discussed the need for sun protection: hats, long sleeves and use of sunscreen if there is significant sun exposure.   Diet: the importance of a healthy diet is discussed. They do have a healthy diet.  The benefits of regular aerobic exercise were discussed. She walks 4 times per week ,  20 minutes.   Depression screen: there are no signs or vegative symptoms of depression- irritability, change in appetite, anhedonia, sadness/tearfullness.  Cognitive assessment: the patient manages all their financial and personal affairs and is actively engaged. They could relate day,date,year and events; recalled 2/3 objects at 3 minutes; performed clock-face test normally.  The following  portions of the patient's history were reviewed and updated as appropriate: allergies, current medications, past family history, past medical history,  past surgical history, past social history  and problem list.  Visual acuity was not assessed per patient preference since she has regular follow up with her ophthalmologist. Hearing and body mass index were assessed and reviewed.   During the course of the visit the patient was educated and counseled about appropriate screening and preventive services including : fall prevention , diabetes screening, nutrition counseling, colorectal cancer screening, and recommended immunizations.    CC: The primary encounter diagnosis was Colon cancer screening. Diagnoses of Need for hepatitis C screening test, Other fatigue, Vitamin D deficiency, Hyperlipidemia, Cervical cancer screening, Essential hypertension, and Encounter for preventive health examination were also pertinent to this visit.  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   She has past surgical history  that includes Portacath placement (12/02/2011); Breast lumpectomy (2008); Colonoscopy; Mastectomy with axillary lymph node dissection (Right, 05/18/2012); Simple mastectomy with axillary sentinel node biopsy (Left, 05/18/2012); and Port-a-cath removal (N/A, 05/18/2012).   Her family history includes Breast cancer in her paternal aunt; Hypertension in her maternal  grandmother and mother; Ovarian cancer in her maternal aunt; Pancreatic cancer in her paternal grandmother; Parkinsonism in her paternal grandfather.She reports that she quit smoking about 17 years ago. She has never used smokeless tobacco. She reports that she drinks alcohol. She reports that she does not use illicit drugs.  Outpatient Prescriptions Prior to Visit  Medication Sig Dispense Refill  . ALPRAZolam (XANAX) 0.25 MG tablet TAKE ONE TABLET BY MOUTH TWICE DAILY AS NEEDED FOR ANXIETY 60 tablet 4  . Ascorbic Acid (VITAMIN C) 100 MG tablet Take 100 mg by mouth daily.      . Calcium Carbonate-Vitamin D (CALTRATE 600+D PO) Take 1 tablet by mouth daily.    . cholecalciferol (VITAMIN D) 400 UNITS TABS Take 1,000 Units by mouth daily.     Marland Kitchen escitalopram (LEXAPRO) 5 MG tablet Take 1 tablet (5 mg total) by mouth daily. 30 tablet 2  . esomeprazole (NEXIUM) 40 MG capsule Take 40 mg by mouth daily before breakfast.    . exemestane (AROMASIN) 25 MG tablet TAKE 1 TABLET BY MOUTH ONCE DAILY AFTER BREAKFAST 30 tablet 11  . goserelin (ZOLADEX) 3.6 MG injection Inject 3.6 mg into the skin every 28 (twenty-eight) days.    Marland Kitchen loperamide (IMODIUM A-D) 2 MG tablet Take 2 mg by mouth 4 (four) times daily as needed for diarrhea or loose stools.     Marland Kitchen LORazepam (ATIVAN) 0.5 MG tablet TAKE ONE TABLET BY MOUTH EVERY 8 HOURS 60 tablet 3  . Multiple Vitamin (MULTIVITAMIN) tablet Take 1 tablet by mouth daily.      Marland Kitchen amLODipine (NORVASC) 5 MG tablet TAKE 1 TABLET BY MOUTH ONCE DAILY 30 tablet 0   No facility-administered medications prior to visit.    Review of Systems   Patient denies headache, fevers, malaise, unintentional weight loss, skin rash, eye pain, sinus congestion and sinus pain, sore throat, dysphagia,  hemoptysis , cough, dyspnea, wheezing, chest pain, palpitations, orthopnea, edema, abdominal pain, nausea, melena, diarrhea, constipation, flank pain, dysuria, hematuria, urinary  Frequency, nocturia,  numbness, tingling, seizures,  Focal weakness, Loss of consciousness,  Tremor, insomnia, depression, anxiety, and suicidal ideation.      Objective:  BP 140/80 mmHg  Pulse 80  Temp(Src) 98.3 F (36.8 C) (Oral)  Resp 14  Ht 5\' 4"  (1.626 m)  Wt 165 lb 4 oz (74.957 kg)  BMI 28.35 kg/m2  SpO2 99%  Physical Exam   General Appearance:    Alert, cooperative, no distress, appears stated age  Head:    Normocephalic, without obvious abnormality, atraumatic  Eyes:    PERRL, conjunctiva/corneas clear, EOM's intact, fundi    benign, both eyes  Ears:    Normal TM's and external ear canals, both ears  Nose:   Nares normal, septum midline, mucosa normal, no drainage    or sinus tenderness  Throat:   Lips, mucosa, and tongue normal; teeth and gums normal  Neck:   Supple, symmetrical, trachea midline, no adenopathy;    thyroid:  no enlargement/tenderness/nodules; no carotid   bruit or JVD  Back:     Symmetric, no curvature, ROM normal, no CVA tenderness  Lungs:     Clear to auscultation bilaterally, respirations unlabored  Chest Wall:  No tenderness or deformity   Heart:    Regular rate and rhythm, S1 and S2 normal, no murmur, rub   or gallop  Breast Exam:    Right mastectomy. Left breast without masses, or nipple abnormality  Abdomen:     Soft, non-tender, bowel sounds active all four quadrants,    no masses, no organomegaly  Genitalia:    Pelvic: cervix normal in appearance, external genitalia normal, no adnexal masses or tenderness, no cervical motion tenderness, rectovaginal septum normal, uterus normal size, shape, and consistency and vagina normal without discharge  Extremities:   Extremities normal, atraumatic, no cyanosis or edema  Pulses:   2+ and symmetric all extremities  Skin:   Skin color, texture, turgor normal, no rashes or lesions  Lymph nodes:   Cervical, supraclavicular, and axillary nodes normal  Neurologic:   CNII-XII intact, normal strength, sensation and reflexes     throughout       Assessment & Plan:   Problem List Items Addressed This Visit      Unprioritized   Hypertension    Well controlled on current regimen. Renal function stable, no changes today.  Lab Results  Component Value Date   CREATININE 0.93 09/19/2014   Lab Results  Component Value Date   NA 141 09/19/2014   K 3.7 09/19/2014   CL 103 09/19/2014   CO2 26 09/19/2014         Relevant Medications   amLODipine (NORVASC) 5 MG tablet   Encounter for preventive health examination    Annual wellness  exam was done as well as a comprehensive physical exam and management of acute and chronic conditions .  During the course of the visit the patient was educated and counseled about appropriate screening and preventive services including :  diabetes screening, lipid analysis with projected  10 year  risk for CAD , nutrition counseling, colorectal cancer screening, and recommended immunizations.  Printed recommendations for health maintenance screenings was given.   Lab Results  Component Value Date   CHOL 217* 09/19/2014   HDL 63.00 09/19/2014   LDLCALC 123* 09/19/2014   LDLDIRECT 140.6 01/18/2011   TRIG 155.0* 09/19/2014   CHOLHDL 3 09/19/2014    Lab Results  Component Value Date   TSH 2.59 09/19/2014   Lab Results  Component Value Date   ALT 21 09/19/2014   AST 18 09/19/2014   ALKPHOS 108 09/19/2014   BILITOT 0.6 09/19/2014          Other Visit Diagnoses    Colon cancer screening    -  Primary    Relevant Orders    Ambulatory referral to Gastroenterology    Need for hepatitis C screening test        Relevant Orders    Hepatitis C antibody (Completed)    Other fatigue        Relevant Orders    TSH (Completed)    Comprehensive metabolic panel (Completed)    Vitamin D deficiency        Relevant Orders    Vit D  25 hydroxy (rtn osteoporosis monitoring) (Completed)    Hyperlipidemia        Relevant Medications    amLODipine (NORVASC) 5 MG tablet    Other  Relevant Orders    Lipid panel (Completed)    Cervical cancer screening        Relevant Orders    Cytology - PAP       I have changed Mary [name]'s amLODipine. I  am also having her maintain her vitamin C, multivitamin, cholecalciferol, loperamide, Calcium Carbonate-Vitamin D (CALTRATE 600+D PO), esomeprazole, ALPRAZolam, goserelin, escitalopram, exemestane, and LORazepam.  Meds ordered this encounter  Medications  . amLODipine (NORVASC) 5 MG tablet    Sig: Take 1 tablet (5 mg total) by mouth daily.    Dispense:  90 tablet    Refill:  1    Medications Discontinued During This Encounter  Medication Reason  . amLODipine (NORVASC) 5 MG tablet Reorder    Follow-up: No Follow-up on file.   [name], MD

## 2014-09-19 NOTE — Patient Instructions (Signed)
We did your PAP smear today  Referral to Dr Vira Agar is in process Health Maintenance Adopting a healthy lifestyle and getting preventive care can go a long way to promote health and wellness. Talk with your health care provider about what schedule of regular examinations is right for you. This is a good chance for you to check in with your provider about disease prevention and staying healthy. In between checkups, there are plenty of things you can do on your own. Experts have done a lot of research about which lifestyle changes and preventive measures are most likely to keep you healthy. Ask your health care provider for more information. WEIGHT AND DIET  Eat a healthy diet  Be sure to include plenty of vegetables, fruits, low-fat dairy products, and lean protein.  Do not eat a lot of foods high in solid fats, added sugars, or salt.  Get regular exercise. This is one of the most important things you can do for your health.  Most adults should exercise for at least 150 minutes each week. The exercise should increase your heart rate and make you sweat (moderate-intensity exercise).  Most adults should also do strengthening exercises at least twice a week. This is in addition to the moderate-intensity exercise.  Maintain a healthy weight  Body mass index (BMI) is a measurement that can be used to identify possible weight problems. It estimates body fat based on height and weight. Your health care provider can help determine your BMI and help you achieve or maintain a healthy weight.  For females 52 years of age and older:   A BMI below 18.5 is considered underweight.  A BMI of 18.5 to 24.9 is normal.  A BMI of 25 to 29.9 is considered overweight.  A BMI of 30 and above is considered obese.  Watch levels of cholesterol and blood lipids  You should start having your blood tested for lipids and cholesterol at 53 years of age, then have this test every 5 years.  You may need to have  your cholesterol levels checked more often if:  Your lipid or cholesterol levels are high.  You are older than 53 years of age.  You are at high risk for heart disease.  CANCER SCREENING   Lung Cancer  Lung cancer screening is recommended for adults 3-64 years old who are at high risk for lung cancer because of a history of smoking.  A yearly low-dose CT scan of the lungs is recommended for people who:  Currently smoke.  Have quit within the past 15 years.  Have at least a 30-pack-year history of smoking. A pack year is smoking an average of one pack of cigarettes a day for 1 year.  Yearly screening should continue until it has been 15 years since you quit.  Yearly screening should stop if you develop a health problem that would prevent you from having lung cancer treatment.  Breast Cancer  Practice breast self-awareness. This means understanding how your breasts normally appear and feel.  It also means doing regular breast self-exams. Let your health care provider know about any changes, no matter how small.  If you are in your 20s or 30s, you should have a clinical breast exam (CBE) by a health care provider every 1-3 years as part of a regular health exam.  If you are 39 or older, have a CBE every year. Also consider having a breast X-ray (mammogram) every year.  If you have a family history of breast  cancer, talk to your health care provider about genetic screening.  If you are at high risk for breast cancer, talk to your health care provider about having an MRI and a mammogram every year.  Breast cancer gene (BRCA) assessment is recommended for women who have family members with BRCA-related cancers. BRCA-related cancers include:  Breast.  Ovarian.  Tubal.  Peritoneal cancers.  Results of the assessment will determine the need for genetic counseling and BRCA1 and BRCA2 testing. Cervical Cancer Routine pelvic examinations to screen for cervical cancer are no  longer recommended for nonpregnant women who are considered low risk for cancer of the pelvic organs (ovaries, uterus, and vagina) and who do not have symptoms. A pelvic examination may be necessary if you have symptoms including those associated with pelvic infections. Ask your health care provider if a screening pelvic exam is right for you.   The Pap test is the screening test for cervical cancer for women who are considered at risk.  If you had a hysterectomy for a problem that was not cancer or a condition that could lead to cancer, then you no longer need Pap tests.  If you are older than 65 years, and you have had normal Pap tests for the past 10 years, you no longer need to have Pap tests.  If you have had past treatment for cervical cancer or a condition that could lead to cancer, you need Pap tests and screening for cancer for at least 20 years after your treatment.  If you no longer get a Pap test, assess your risk factors if they change (such as having a new sexual partner). This can affect whether you should start being screened again.  Some women have medical problems that increase their chance of getting cervical cancer. If this is the case for you, your health care provider may recommend more frequent screening and Pap tests.  The human papillomavirus (HPV) test is another test that may be used for cervical cancer screening. The HPV test looks for the virus that can cause cell changes in the cervix. The cells collected during the Pap test can be tested for HPV.  The HPV test can be used to screen women 8 years of age and older. Getting tested for HPV can extend the interval between normal Pap tests from three to five years.  An HPV test also should be used to screen women of any age who have unclear Pap test results.  After 53 years of age, women should have HPV testing as often as Pap tests.  Colorectal Cancer  This type of cancer can be detected and often  prevented.  Routine colorectal cancer screening usually begins at 53 years of age and continues through 53 years of age.  Your health care provider may recommend screening at an earlier age if you have risk factors for colon cancer.  Your health care provider may also recommend using home test kits to check for hidden blood in the stool.  A small camera at the end of a tube can be used to examine your colon directly (sigmoidoscopy or colonoscopy). This is done to check for the earliest forms of colorectal cancer.  Routine screening usually begins at age 32.  Direct examination of the colon should be repeated every 5-10 years through 53 years of age. However, you may need to be screened more often if early forms of precancerous polyps or small growths are found. Skin Cancer  Check your skin from head to toe  regularly.  Tell your health care provider about any new moles or changes in moles, especially if there is a change in a mole's shape or color.  Also tell your health care provider if you have a mole that is larger than the size of a pencil eraser.  Always use sunscreen. Apply sunscreen liberally and repeatedly throughout the day.  Protect yourself by wearing long sleeves, pants, a wide-brimmed hat, and sunglasses whenever you are outside. HEART DISEASE, DIABETES, AND HIGH BLOOD PRESSURE   Have your blood pressure checked at least every 1-2 years. High blood pressure causes heart disease and increases the risk of stroke.  If you are between 66 years and 3 years old, ask your health care provider if you should take aspirin to prevent strokes.  Have regular diabetes screenings. This involves taking a blood sample to check your fasting blood sugar level.  If you are at a normal weight and have a low risk for diabetes, have this test once every three years after 53 years of age.  If you are overweight and have a high risk for diabetes, consider being tested at a younger age or more  often. PREVENTING INFECTION  Hepatitis B  If you have a higher risk for hepatitis B, you should be screened for this virus. You are considered at high risk for hepatitis B if:  You were born in a country where hepatitis B is common. Ask your health care provider which countries are considered high risk.  Your parents were born in a high-risk country, and you have not been immunized against hepatitis B (hepatitis B vaccine).  You have HIV or AIDS.  You use needles to inject street drugs.  You live with someone who has hepatitis B.  You have had sex with someone who has hepatitis B.  You get hemodialysis treatment.  You take certain medicines for conditions, including cancer, organ transplantation, and autoimmune conditions. Hepatitis C  Blood testing is recommended for:  Everyone born from [birth year] through 1964.  Anyone with known risk factors for hepatitis C. Sexually transmitted infections (STIs)  You should be screened for sexually transmitted infections (STIs) including gonorrhea and chlamydia if:  You are sexually active and are younger than 53 years of age.  You are older than 53 years of age and your health care provider tells you that you are at risk for this type of infection.  Your sexual activity has changed since you were last screened and you are at an increased risk for chlamydia or gonorrhea. Ask your health care provider if you are at risk.  If you do not have HIV, but are at risk, it may be recommended that you take a prescription medicine daily to prevent HIV infection. This is called pre-exposure prophylaxis (PrEP). You are considered at risk if:  You are sexually active and do not regularly use condoms or know the HIV status of your partner(s).  You take drugs by injection.  You are sexually active with a partner who has HIV. Talk with your health care provider about whether you are at high risk of being infected with HIV. If you choose to begin PrEP, you  should first be tested for HIV. You should then be tested every 3 months for as long as you are taking PrEP.  PREGNANCY   If you are premenopausal and you may become pregnant, ask your health care provider about preconception counseling.  If you may become pregnant, take 400 to 800 micrograms (mcg) of  folic acid every day.  If you want to prevent pregnancy, talk to your health care provider about birth control (contraception). OSTEOPOROSIS AND MENOPAUSE   Osteoporosis is a disease in which the bones lose minerals and strength with aging. This can result in serious bone fractures. Your risk for osteoporosis can be identified using a bone density scan.  If you are 81 years of age or older, or if you are at risk for osteoporosis and fractures, ask your health care provider if you should be screened.  Ask your health care provider whether you should take a calcium or vitamin D supplement to lower your risk for osteoporosis.  Menopause may have certain physical symptoms and risks.  Hormone replacement therapy may reduce some of these symptoms and risks. Talk to your health care provider about whether hormone replacement therapy is right for you.  HOME CARE INSTRUCTIONS   Schedule regular health, dental, and eye exams.  Stay current with your immunizations.   Do not use any tobacco products including cigarettes, chewing tobacco, or electronic cigarettes.  If you are pregnant, do not drink alcohol.  If you are breastfeeding, limit how much and how often you drink alcohol.  Limit alcohol intake to no more than 1 drink per day for nonpregnant women. One drink equals 12 ounces of beer, 5 ounces of wine, or 1 ounces of hard liquor.  Do not use street drugs.  Do not share needles.  Ask your health care provider for help if you need support or information about quitting drugs.  Tell your health care provider if you often feel depressed.  Tell your health care provider if you have ever  been abused or do not feel safe at home. Document Released: 09/20/2010 Document Revised: 07/22/2013 Document Reviewed: 02/06/2013 [facility] Patient Information 2015 [place], [place]. This information is not intended to replace advice given to you by your health care provider. Make sure you discuss any questions you have with your health care provider.

## 2014-09-20 LAB — HEPATITIS C ANTIBODY: HCV Ab: NEGATIVE

## 2014-09-21 DIAGNOSIS — Z1211 Encounter for screening for malignant neoplasm of colon: Secondary | ICD-10-CM | POA: Insufficient documentation

## 2014-09-21 NOTE — Assessment & Plan Note (Signed)
Well controlled on current regimen. Renal function stable, no changes today.  Lab Results  Component Value Date   CREATININE 0.93 09/19/2014   Lab Results  Component Value Date   NA 141 09/19/2014   K 3.7 09/19/2014   CL 103 09/19/2014   CO2 26 09/19/2014

## 2014-09-21 NOTE — Assessment & Plan Note (Signed)
Annual wellness  exam was done as well as a comprehensive physical exam and management of acute and chronic conditions .  During the course of the visit the patient was educated and counseled about appropriate screening and preventive services including :  diabetes screening, lipid analysis with projected  10 year  risk for CAD , nutrition counseling, colorectal cancer screening, and recommended immunizations.  Printed recommendations for health maintenance screenings was given.   Lab Results  Component Value Date   CHOL 217* 09/19/2014   HDL 63.00 09/19/2014   LDLCALC 123* 09/19/2014   LDLDIRECT 140.6 01/18/2011   TRIG 155.0* 09/19/2014   CHOLHDL 3 09/19/2014    Lab Results  Component Value Date   TSH 2.59 09/19/2014   Lab Results  Component Value Date   ALT 21 09/19/2014   AST 18 09/19/2014   ALKPHOS 108 09/19/2014   BILITOT 0.6 09/19/2014

## 2014-09-22 ENCOUNTER — Encounter: Payer: Self-pay | Admitting: Internal Medicine

## 2014-09-24 ENCOUNTER — Encounter: Payer: Self-pay | Admitting: Internal Medicine

## 2014-09-24 LAB — CYTOLOGY - PAP

## 2014-10-14 ENCOUNTER — Ambulatory Visit (HOSPITAL_BASED_OUTPATIENT_CLINIC_OR_DEPARTMENT_OTHER): Payer: 59

## 2014-10-14 VITALS — BP 152/82 | HR 76 | Temp 98.2°F | Resp 16

## 2014-10-14 DIAGNOSIS — C50912 Malignant neoplasm of unspecified site of left female breast: Secondary | ICD-10-CM

## 2014-10-14 DIAGNOSIS — C50411 Malignant neoplasm of upper-outer quadrant of right female breast: Secondary | ICD-10-CM

## 2014-10-14 DIAGNOSIS — Z5111 Encounter for antineoplastic chemotherapy: Secondary | ICD-10-CM | POA: Diagnosis not present

## 2014-10-14 DIAGNOSIS — C773 Secondary and unspecified malignant neoplasm of axilla and upper limb lymph nodes: Secondary | ICD-10-CM

## 2014-10-14 DIAGNOSIS — C50412 Malignant neoplasm of upper-outer quadrant of left female breast: Secondary | ICD-10-CM

## 2014-10-14 MED ORDER — GOSERELIN ACETATE 3.6 MG ~~LOC~~ IMPL
3.6000 mg | DRUG_IMPLANT | Freq: Once | SUBCUTANEOUS | Status: AC
  Administered 2014-10-14: 3.6 mg via SUBCUTANEOUS
  Filled 2014-10-14: qty 3.6

## 2014-10-14 NOTE — Patient Instructions (Signed)
Goserelin injection What is this medicine? GOSERELIN (GOE se rel in) is similar to a hormone found in the body. It lowers the amount of sex hormones that the body makes. Men will have lower testosterone levels and women will have lower estrogen levels while taking this medicine. In men, this medicine is used to treat prostate cancer; the injection is either given once per month or once every 12 weeks. A once per month injection (only) is used to treat women with endometriosis, dysfunctional uterine bleeding, or advanced breast cancer. This medicine may be used for other purposes; ask your health care provider or pharmacist if you have questions. COMMON BRAND NAME(S): Zoladex What should I tell my health care provider before I take this medicine? They need to know if you have any of these conditions (some only apply to women): -diabetes -heart disease or previous heart attack -high blood pressure -high cholesterol -kidney disease -osteoporosis or low bone density -problems passing urine -spinal cord injury -stroke -tobacco smoker -an unusual or allergic reaction to goserelin, hormone therapy, other medicines, foods, dyes, or preservatives -pregnant or trying to get pregnant -breast-feeding How should I use this medicine? This medicine is for injection under the skin. It is given by a health care professional in a hospital or clinic setting. Men receive this injection once every 4 weeks or once every 12 weeks. Women will only receive the once every 4 weeks injection. Talk to your pediatrician regarding the use of this medicine in children. Special care may be needed. Overdosage: If you think you have taken too much of this medicine contact a poison control center or emergency room at once. NOTE: This medicine is only for you. Do not share this medicine with others. What if I miss a dose? It is important not to miss your dose. Call your doctor or health care professional if you are unable to  keep an appointment. What may interact with this medicine? -female hormones like estrogen -herbal or dietary supplements like black cohosh, chasteberry, or DHEA -female hormones like testosterone -prasterone This list may not describe all possible interactions. Give your health care provider a list of all the medicines, herbs, non-prescription drugs, or dietary supplements you use. Also tell them if you smoke, drink alcohol, or use illegal drugs. Some items may interact with your medicine. What should I watch for while using this medicine? Visit your doctor or health care professional for regular checks on your progress. Your symptoms may appear to get worse during the first weeks of this therapy. Tell your doctor or healthcare professional if your symptoms do not start to get better or if they get worse after this time. Your bones may get weaker if you take this medicine for a long time. If you smoke or frequently drink alcohol you may increase your risk of bone loss. A family history of osteoporosis, chronic use of drugs for seizures (convulsions), or corticosteroids can also increase your risk of bone loss. Talk to your doctor about how to keep your bones strong. This medicine should stop regular monthly menstration in women. Tell your doctor if you continue to menstrate. Women should not become pregnant while taking this medicine or for 12 weeks after stopping this medicine. Women should inform their doctor if they wish to become pregnant or think they might be pregnant. There is a potential for serious side effects to an unborn child. Talk to your health care professional or pharmacist for more information. Do not breast-feed an infant while taking   this medicine. Men should inform their doctors if they wish to father a child. This medicine may lower sperm counts. Talk to your health care professional or pharmacist for more information. What side effects may I notice from receiving this  medicine? Side effects that you should report to your doctor or health care professional as soon as possible: -allergic reactions like skin rash, itching or hives, swelling of the face, lips, or tongue -bone pain -breathing problems -changes in vision -chest pain -feeling faint or lightheaded, falls -fever, chills -pain, swelling, warmth in the leg -pain, tingling, numbness in the hands or feet -signs and symptoms of low blood pressure like dizziness; feeling faint or lightheaded, falls; unusually weak or tired -stomach pain -swelling of the ankles, feet, hands -trouble passing urine or change in the amount of urine -unusually high or low blood pressure -unusually weak or tired Side effects that usually do not require medical attention (report to your doctor or health care professional if they continue or are bothersome): -change in sex drive or performance -changes in breast size in both males and females -changes in emotions or moods -headache -hot flashes -irritation at site where injected -loss of appetite -skin problems like acne, dry skin -vaginal dryness This list may not describe all possible side effects. Call your doctor for medical advice about side effects. You may report side effects to FDA at 1-800-FDA-1088. Where should I keep my medicine? This drug is given in a hospital or clinic and will not be stored at home. NOTE: This sheet is a summary. It may not cover all possible information. If you have questions about this medicine, talk to your doctor, pharmacist, or health care provider.  2015, Elsevier/Gold Standard. (2013-05-14 11:10:35)  

## 2014-10-28 ENCOUNTER — Telehealth: Payer: Self-pay | Admitting: Hematology and Oncology

## 2014-10-28 ENCOUNTER — Ambulatory Visit: Payer: 59

## 2014-10-28 ENCOUNTER — Encounter: Payer: Self-pay | Admitting: Hematology and Oncology

## 2014-10-28 ENCOUNTER — Ambulatory Visit (HOSPITAL_BASED_OUTPATIENT_CLINIC_OR_DEPARTMENT_OTHER): Payer: 59 | Admitting: Hematology and Oncology

## 2014-10-28 VITALS — BP 142/77 | HR 80 | Temp 98.3°F | Resp 18 | Ht 64.0 in | Wt 169.2 lb

## 2014-10-28 DIAGNOSIS — Z853 Personal history of malignant neoplasm of breast: Secondary | ICD-10-CM

## 2014-10-28 DIAGNOSIS — C50412 Malignant neoplasm of upper-outer quadrant of left female breast: Secondary | ICD-10-CM

## 2014-10-28 NOTE — Assessment & Plan Note (Signed)
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 Occasional hot flashes and night sweats  2. Bone density showed T score -0.2 and -0.3.  Recommendation: 1. We will continue zolodex injections monthly 2. Continue hormonal therapy We cannot determine whether she is menopausal or not because she is currently on Zoladex injections. Since she is only 54 years old, it is unlikely that she would be menopausal at this age hence I recommended continuation of the same treatment with Zoladex and antiestrogen therapy.  Breast cancer surveillance: 1. No role of imaging studies since she had bilateral mastectomies 2. Chest wall and axillary examination 10/28/2014 is normal  Return to clinic in 6 months for follow-up

## 2014-10-28 NOTE — Telephone Encounter (Signed)
Gave avs & appointment for August through August 2017

## 2014-10-28 NOTE — Progress Notes (Signed)
Patient Care Team: Mary Mc, MD as PCP - General (Internal Medicine)  DIAGNOSIS: Primary cancer of upper outer quadrant of left female breast   Staging form: Breast, AJCC 7th Edition     Clinical stage from 11/23/2011: Stage IIIA (T3, N1, cM0) - Signed by Mary Edison, MD on 11/23/2011     Pathologic: No stage assigned - Unsigned   SUMMARY OF ONCOLOGIC HISTORY:   Primary cancer of upper outer quadrant of left female breast   11/17/2011 Initial Diagnosis Cancer of upper-outer quadrant of female breast diagnosed when pt presented with Nipple flattening and palpable lesion.   12/08/2011 - 03/29/2012 Chemotherapy Neo adjuvant FEC X 4 followed by Weekly Taxol X 12   05/18/2012 Surgery Bilateral Mastectomies: Right revealed invasive  Lobular carcinoma measuring 2.3 cm 8 of 17 lymph nodes were positive (T2 N2A). 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   07/04/2012 - 08/17/2012 Radiation Therapy Rt Breast irradiation   08/19/2012 -  Anti-estrogen oral therapy Aromasin + Zolodex Monthly    CHIEF COMPLIANT: Follow-up and antiestrogen therapy with Aromasin and Zoladex  INTERVAL HISTORY: Mary BARTLE is a 54 year old with above-mentioned history of left breast cancer underwent bilateral mastectomies and radiation currently on antiestrogen therapy with Aromasin and Zoladex. He is tolerating the treatment fairly well. She does have occasional hot flashes and night sweats. But she is able to manage them fairly well. Denies any muscle aches or discomfort. Denies any lumps or nodules in chest wall.  REVIEW OF SYSTEMS:   Constitutional: Denies fevers, chills or abnormal weight loss Eyes: Denies blurriness of vision Ears, nose, mouth, throat, and face: Denies mucositis or sore throat Respiratory: Denies cough, dyspnea or wheezes Cardiovascular: Denies palpitation, chest discomfort or lower extremity swelling Gastrointestinal:  Denies nausea, heartburn or change  in bowel habits Skin: Denies abnormal skin rashes Lymphatics: Denies new lymphadenopathy or easy bruising Neurological:Denies numbness, tingling or new weaknesses Behavioral/Psych: Mood is stable, no new changes  Breast:  denies any pain or lumps or nodules in chest wall All other systems were reviewed with the patient and are negative.  I have reviewed the past medical history, past surgical history, social history and family history with the patient and they are unchanged from previous note.  ALLERGIES:  is allergic to adhesive.  MEDICATIONS:  Current Outpatient Prescriptions  Medication Sig Dispense Refill  . ALPRAZolam (XANAX) 0.25 MG tablet TAKE ONE TABLET BY MOUTH TWICE DAILY AS NEEDED FOR ANXIETY 60 tablet 4  . amLODipine (NORVASC) 5 MG tablet Take 1 tablet (5 mg total) by mouth daily. 90 tablet 1  . Ascorbic Acid (VITAMIN C) 100 MG tablet Take 100 mg by mouth daily.      . Calcium Carbonate-Vitamin D (CALTRATE 600+D PO) Take 1 tablet by mouth daily.    . cholecalciferol (VITAMIN D) 400 UNITS TABS Take 1,000 Units by mouth daily.     Marland Kitchen escitalopram (LEXAPRO) 5 MG tablet Take 1 tablet (5 mg total) by mouth daily. 30 tablet 2  . esomeprazole (NEXIUM) 40 MG capsule Take 40 mg by mouth daily before breakfast.    . exemestane (AROMASIN) 25 MG tablet TAKE 1 TABLET BY MOUTH ONCE DAILY AFTER BREAKFAST 30 tablet 11  . goserelin (ZOLADEX) 3.6 MG injection Inject 3.6 mg into the skin every 28 (twenty-eight) days.    Marland Kitchen loperamide (IMODIUM A-D) 2 MG tablet Take 2 mg by mouth 4 (four) times daily as needed for diarrhea or  loose stools.     Marland Kitchen LORazepam (ATIVAN) 0.5 MG tablet TAKE ONE TABLET BY MOUTH EVERY 8 HOURS 60 tablet 3  . Multiple Vitamin (MULTIVITAMIN) tablet Take 1 tablet by mouth daily.       No current facility-administered medications for this visit.    PHYSICAL EXAMINATION: ECOG PERFORMANCE STATUS: 1 - Symptomatic but completely ambulatory  Filed Vitals:   10/28/14 1107  BP:  142/77  Pulse: 80  Temp: 98.3 F (36.8 C)  Resp: 18   Filed Weights   10/28/14 1107  Weight: 169 lb 3.2 oz (76.749 kg)    GENERAL:alert, no distress and comfortable SKIN: skin color, texture, turgor are normal, no rashes or significant lesions EYES: normal, Conjunctiva are pink and non-injected, sclera clear OROPHARYNX:no exudate, no erythema and lips, buccal mucosa, and tongue normal  NECK: supple, thyroid normal size, non-tender, without nodularity LYMPH:  no palpable lymphadenopathy in the cervical, axillary or inguinal LUNGS: clear to auscultation and percussion with normal breathing effort HEART: regular rate & rhythm and no murmurs and no lower extremity edema ABDOMEN:abdomen soft, non-tender and normal bowel sounds Musculoskeletal:no cyanosis of digits and no clubbing  NEURO: alert & oriented x 3 with fluent speech, no focal motor/sensory deficits BREAST: No palpable lumps or nodules in chest wall or axilla. (exam performed in the presence of a chaperone)  LABORATORY DATA:  I have reviewed the data as listed   Chemistry      Component Value Date/Time   NA 141 09/19/2014 1039   NA 141 04/29/2014 1327   K 3.7 09/19/2014 1039   K 4.0 04/29/2014 1327   CL 103 09/19/2014 1039   CL 106 08/20/2012 0802   CO2 26 09/19/2014 1039   CO2 24 04/29/2014 1327   BUN 16 09/19/2014 1039   BUN 15.7 04/29/2014 1327   CREATININE 0.93 09/19/2014 1039   CREATININE 0.9 04/29/2014 1327   CREATININE 0.84 12/06/2013 1602      Component Value Date/Time   CALCIUM 9.7 09/19/2014 1039   CALCIUM 9.7 04/29/2014 1327   ALKPHOS 108 09/19/2014 1039   ALKPHOS 124 04/29/2014 1327   AST 18 09/19/2014 1039   AST 21 04/29/2014 1327   ALT 21 09/19/2014 1039   ALT 30 04/29/2014 1327   BILITOT 0.6 09/19/2014 1039   BILITOT 0.34 04/29/2014 1327       Lab Results  Component Value Date   WBC 6.4 04/29/2014   HGB 14.8 04/29/2014   HCT 43.8 04/29/2014   MCV 95.0 04/29/2014   PLT 206 04/29/2014    NEUTROABS 3.9 04/29/2014     RADIOGRAPHIC STUDIES: I have personally reviewed the radiology reports and agreed with their findings. No results found.   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 Occasional hot flashes and night sweats  2. Bone density showed T score -0.2 and -0.3.  Recommendation: 1. We will continue zolodex injections monthly 2. Continue hormonal therapy We cannot determine whether she is menopausal or not because she is currently on Zoladex injections. Since she is only 54 years old, it is unlikely  that she would be menopausal at this age hence I recommended continuation of the same treatment with Zoladex and antiestrogen therapy.  Breast cancer surveillance: 1. No role of imaging studies since she had bilateral mastectomies 2. Chest wall and axillary examination 10/28/2014 is normal  Return to clinic in 6 months for follow-up    No orders of the defined types were placed in this encounter.   The patient has a good understanding of the overall plan. she agrees with it. she will call with any problems that may develop before the next visit here.   Rulon Eisenmenger, MD     No brownrakes,frances

## 2014-10-30 ENCOUNTER — Ambulatory Visit: Payer: 59 | Admitting: Hematology and Oncology

## 2014-11-04 ENCOUNTER — Ambulatory Visit: Payer: 59

## 2014-11-04 ENCOUNTER — Ambulatory Visit: Payer: 59 | Admitting: Hematology and Oncology

## 2014-11-11 ENCOUNTER — Ambulatory Visit (HOSPITAL_BASED_OUTPATIENT_CLINIC_OR_DEPARTMENT_OTHER): Payer: 59

## 2014-11-11 VITALS — BP 135/91 | HR 74 | Temp 98.7°F

## 2014-11-11 DIAGNOSIS — C50912 Malignant neoplasm of unspecified site of left female breast: Secondary | ICD-10-CM

## 2014-11-11 DIAGNOSIS — Z5111 Encounter for antineoplastic chemotherapy: Secondary | ICD-10-CM

## 2014-11-11 DIAGNOSIS — C50412 Malignant neoplasm of upper-outer quadrant of left female breast: Secondary | ICD-10-CM

## 2014-11-11 DIAGNOSIS — C50411 Malignant neoplasm of upper-outer quadrant of right female breast: Secondary | ICD-10-CM

## 2014-11-11 MED ORDER — GOSERELIN ACETATE 3.6 MG ~~LOC~~ IMPL
3.6000 mg | DRUG_IMPLANT | Freq: Once | SUBCUTANEOUS | Status: AC
  Administered 2014-11-11: 3.6 mg via SUBCUTANEOUS
  Filled 2014-11-11: qty 3.6

## 2014-12-09 ENCOUNTER — Ambulatory Visit (HOSPITAL_BASED_OUTPATIENT_CLINIC_OR_DEPARTMENT_OTHER): Payer: 59

## 2014-12-09 VITALS — BP 129/77 | HR 78 | Temp 98.6°F

## 2014-12-09 DIAGNOSIS — C50412 Malignant neoplasm of upper-outer quadrant of left female breast: Secondary | ICD-10-CM

## 2014-12-09 DIAGNOSIS — C50411 Malignant neoplasm of upper-outer quadrant of right female breast: Secondary | ICD-10-CM | POA: Diagnosis not present

## 2014-12-09 DIAGNOSIS — C50912 Malignant neoplasm of unspecified site of left female breast: Secondary | ICD-10-CM

## 2014-12-09 DIAGNOSIS — Z5111 Encounter for antineoplastic chemotherapy: Secondary | ICD-10-CM | POA: Diagnosis not present

## 2014-12-09 DIAGNOSIS — C773 Secondary and unspecified malignant neoplasm of axilla and upper limb lymph nodes: Secondary | ICD-10-CM | POA: Diagnosis not present

## 2014-12-09 MED ORDER — GOSERELIN ACETATE 3.6 MG ~~LOC~~ IMPL
3.6000 mg | DRUG_IMPLANT | Freq: Once | SUBCUTANEOUS | Status: AC
  Administered 2014-12-09: 3.6 mg via SUBCUTANEOUS
  Filled 2014-12-09: qty 3.6

## 2014-12-11 ENCOUNTER — Other Ambulatory Visit: Payer: Self-pay | Admitting: Hematology and Oncology

## 2014-12-12 ENCOUNTER — Other Ambulatory Visit: Payer: Self-pay | Admitting: *Deleted

## 2014-12-12 DIAGNOSIS — C50412 Malignant neoplasm of upper-outer quadrant of left female breast: Secondary | ICD-10-CM

## 2014-12-12 MED ORDER — LORAZEPAM 0.5 MG PO TABS: ORAL_TABLET | ORAL | Status: DC

## 2014-12-12 NOTE — Telephone Encounter (Signed)
Refilled ativan. Patient notified and verbalized understanding.

## 2015-01-06 ENCOUNTER — Ambulatory Visit (HOSPITAL_BASED_OUTPATIENT_CLINIC_OR_DEPARTMENT_OTHER): Payer: 59

## 2015-01-06 VITALS — BP 134/73 | HR 70 | Temp 98.8°F

## 2015-01-06 DIAGNOSIS — C773 Secondary and unspecified malignant neoplasm of axilla and upper limb lymph nodes: Secondary | ICD-10-CM

## 2015-01-06 DIAGNOSIS — C50212 Malignant neoplasm of upper-inner quadrant of left female breast: Secondary | ICD-10-CM

## 2015-01-06 DIAGNOSIS — Z5111 Encounter for antineoplastic chemotherapy: Secondary | ICD-10-CM

## 2015-01-06 DIAGNOSIS — C50411 Malignant neoplasm of upper-outer quadrant of right female breast: Secondary | ICD-10-CM | POA: Diagnosis not present

## 2015-01-06 DIAGNOSIS — C50412 Malignant neoplasm of upper-outer quadrant of left female breast: Secondary | ICD-10-CM

## 2015-01-06 MED ORDER — GOSERELIN ACETATE 3.6 MG ~~LOC~~ IMPL
3.6000 mg | DRUG_IMPLANT | Freq: Once | SUBCUTANEOUS | Status: AC
  Administered 2015-01-06: 3.6 mg via SUBCUTANEOUS
  Filled 2015-01-06: qty 3.6

## 2015-01-20 ENCOUNTER — Other Ambulatory Visit: Payer: Self-pay

## 2015-01-20 DIAGNOSIS — C50412 Malignant neoplasm of upper-outer quadrant of left female breast: Secondary | ICD-10-CM

## 2015-01-21 ENCOUNTER — Encounter: Payer: Self-pay | Admitting: Hematology and Oncology

## 2015-01-21 NOTE — Progress Notes (Signed)
I placed forms for patient on desk of nurse for dr. Lindi Adie

## 2015-01-26 ENCOUNTER — Encounter: Payer: Self-pay | Admitting: Hematology and Oncology

## 2015-01-26 NOTE — Progress Notes (Signed)
Nurse faxed fmla forms for patient to [phone]. Copy in file

## 2015-02-03 ENCOUNTER — Ambulatory Visit (HOSPITAL_BASED_OUTPATIENT_CLINIC_OR_DEPARTMENT_OTHER): Payer: 59

## 2015-02-03 VITALS — BP 137/77 | HR 77 | Temp 98.7°F

## 2015-02-03 DIAGNOSIS — C773 Secondary and unspecified malignant neoplasm of axilla and upper limb lymph nodes: Secondary | ICD-10-CM | POA: Diagnosis not present

## 2015-02-03 DIAGNOSIS — C50412 Malignant neoplasm of upper-outer quadrant of left female breast: Secondary | ICD-10-CM

## 2015-02-03 DIAGNOSIS — C50411 Malignant neoplasm of upper-outer quadrant of right female breast: Secondary | ICD-10-CM | POA: Diagnosis not present

## 2015-02-03 DIAGNOSIS — Z5111 Encounter for antineoplastic chemotherapy: Secondary | ICD-10-CM | POA: Diagnosis not present

## 2015-02-03 MED ORDER — GOSERELIN ACETATE 3.6 MG ~~LOC~~ IMPL
3.6000 mg | DRUG_IMPLANT | Freq: Once | SUBCUTANEOUS | Status: AC
  Administered 2015-02-03: 3.6 mg via SUBCUTANEOUS
  Filled 2015-02-03: qty 3.6

## 2015-03-03 ENCOUNTER — Ambulatory Visit (HOSPITAL_BASED_OUTPATIENT_CLINIC_OR_DEPARTMENT_OTHER): Payer: 59

## 2015-03-03 DIAGNOSIS — C50412 Malignant neoplasm of upper-outer quadrant of left female breast: Secondary | ICD-10-CM

## 2015-03-03 DIAGNOSIS — Z5111 Encounter for antineoplastic chemotherapy: Secondary | ICD-10-CM

## 2015-03-03 MED ORDER — GOSERELIN ACETATE 3.6 MG ~~LOC~~ IMPL
3.6000 mg | DRUG_IMPLANT | Freq: Once | SUBCUTANEOUS | Status: AC
  Administered 2015-03-03: 3.6 mg via SUBCUTANEOUS
  Filled 2015-03-03: qty 3.6

## 2015-03-03 NOTE — Patient Instructions (Signed)
Goserelin injection What is this medicine? GOSERELIN (GOE se rel in) is similar to a hormone found in the body. It lowers the amount of sex hormones that the body makes. Men will have lower testosterone levels and women will have lower estrogen levels while taking this medicine. In men, this medicine is used to treat prostate cancer; the injection is either given once per month or once every 12 weeks. A once per month injection (only) is used to treat women with endometriosis, dysfunctional uterine bleeding, or advanced breast cancer. This medicine may be used for other purposes; ask your health care provider or pharmacist if you have questions. What should I tell my health care provider before I take this medicine? They need to know if you have any of these conditions (some only apply to women): -diabetes -heart disease or previous heart attack -high blood pressure -high cholesterol -kidney disease -osteoporosis or low bone density -problems passing urine -spinal cord injury -stroke -tobacco smoker -an unusual or allergic reaction to goserelin, hormone therapy, other medicines, foods, dyes, or preservatives -pregnant or trying to get pregnant -breast-feeding How should I use this medicine? This medicine is for injection under the skin. It is given by a health care professional in a hospital or clinic setting. Men receive this injection once every 4 weeks or once every 12 weeks. Women will only receive the once every 4 weeks injection. Talk to your pediatrician regarding the use of this medicine in children. Special care may be needed. Overdosage: If you think you have taken too much of this medicine contact a poison control center or emergency room at once. NOTE: This medicine is only for you. Do not share this medicine with others. What if I miss a dose? It is important not to miss your dose. Call your doctor or health care professional if you are unable to keep an appointment. What may  interact with this medicine? -female hormones like estrogen -herbal or dietary supplements like black cohosh, chasteberry, or DHEA -female hormones like testosterone -prasterone This list may not describe all possible interactions. Give your health care provider a list of all the medicines, herbs, non-prescription drugs, or dietary supplements you use. Also tell them if you smoke, drink alcohol, or use illegal drugs. Some items may interact with your medicine. What should I watch for while using this medicine? Visit your doctor or health care professional for regular checks on your progress. Your symptoms may appear to get worse during the first weeks of this therapy. Tell your doctor or healthcare professional if your symptoms do not start to get better or if they get worse after this time. Your bones may get weaker if you take this medicine for a long time. If you smoke or frequently drink alcohol you may increase your risk of bone loss. A family history of osteoporosis, chronic use of drugs for seizures (convulsions), or corticosteroids can also increase your risk of bone loss. Talk to your doctor about how to keep your bones strong. This medicine should stop regular monthly menstration in women. Tell your doctor if you continue to menstrate. Women should not become pregnant while taking this medicine or for 12 weeks after stopping this medicine. Women should inform their doctor if they wish to become pregnant or think they might be pregnant. There is a potential for serious side effects to an unborn child. Talk to your health care professional or pharmacist for more information. Do not breast-feed an infant while taking this medicine. Men should   inform their doctors if they wish to father a child. This medicine may lower sperm counts. Talk to your health care professional or pharmacist for more information. What side effects may I notice from receiving this medicine? Side effects that you should  report to your doctor or health care professional as soon as possible: -allergic reactions like skin rash, itching or hives, swelling of the face, lips, or tongue -bone pain -breathing problems -changes in vision -chest pain -feeling faint or lightheaded, falls -fever, chills -pain, swelling, warmth in the leg -pain, tingling, numbness in the hands or feet -signs and symptoms of low blood pressure like dizziness; feeling faint or lightheaded, falls; unusually weak or tired -stomach pain -swelling of the ankles, feet, hands -trouble passing urine or change in the amount of urine -unusually high or low blood pressure -unusually weak or tired Side effects that usually do not require medical attention (report to your doctor or health care professional if they continue or are bothersome): -change in sex drive or performance -changes in breast size in both males and females -changes in emotions or moods -headache -hot flashes -irritation at site where injected -loss of appetite -skin problems like acne, dry skin -vaginal dryness This list may not describe all possible side effects. Call your doctor for medical advice about side effects. You may report side effects to FDA at 1-800-FDA-1088. Where should I keep my medicine? This drug is given in a hospital or clinic and will not be stored at home. NOTE: This sheet is a summary. It may not cover all possible information. If you have questions about this medicine, talk to your doctor, pharmacist, or health care provider.    2016, Elsevier/Gold Standard. (2013-05-14 11:10:35)  

## 2015-03-23 ENCOUNTER — Other Ambulatory Visit: Payer: Self-pay | Admitting: Hematology and Oncology

## 2015-03-25 NOTE — Telephone Encounter (Signed)
Per Dr. Gudena 

## 2015-03-26 ENCOUNTER — Telehealth: Payer: Self-pay | Admitting: *Deleted

## 2015-03-26 DIAGNOSIS — C50412 Malignant neoplasm of upper-outer quadrant of left female breast: Secondary | ICD-10-CM

## 2015-03-26 NOTE — Telephone Encounter (Signed)
Patient has requested a medication refill for lorazapam.

## 2015-03-26 NOTE — Telephone Encounter (Signed)
Last OV was 09/19/2014, Please advise refill. Thanks

## 2015-03-27 MED ORDER — LORAZEPAM 0.5 MG PO TABS: ORAL_TABLET | ORAL | Status: DC

## 2015-03-27 NOTE — Telephone Encounter (Signed)
Ok to refill,  printed rx  

## 2015-03-31 ENCOUNTER — Ambulatory Visit: Payer: 59

## 2015-03-31 ENCOUNTER — Telehealth: Payer: Self-pay | Admitting: Hematology and Oncology

## 2015-03-31 NOTE — Telephone Encounter (Signed)
Patient called in to reschedule her inj to 1/12

## 2015-04-02 ENCOUNTER — Ambulatory Visit (HOSPITAL_BASED_OUTPATIENT_CLINIC_OR_DEPARTMENT_OTHER): Payer: 59

## 2015-04-02 VITALS — BP 142/88 | HR 73 | Temp 98.5°F

## 2015-04-02 DIAGNOSIS — C50412 Malignant neoplasm of upper-outer quadrant of left female breast: Secondary | ICD-10-CM | POA: Diagnosis not present

## 2015-04-02 DIAGNOSIS — C773 Secondary and unspecified malignant neoplasm of axilla and upper limb lymph nodes: Secondary | ICD-10-CM

## 2015-04-02 DIAGNOSIS — Z5111 Encounter for antineoplastic chemotherapy: Secondary | ICD-10-CM | POA: Diagnosis not present

## 2015-04-02 MED ORDER — GOSERELIN ACETATE 3.6 MG ~~LOC~~ IMPL
3.6000 mg | DRUG_IMPLANT | Freq: Once | SUBCUTANEOUS | Status: AC
  Administered 2015-04-02: 3.6 mg via SUBCUTANEOUS
  Filled 2015-04-02: qty 3.6

## 2015-04-02 MED ORDER — LORAZEPAM 0.5 MG PO TABS: ORAL_TABLET | ORAL | Status: DC

## 2015-04-02 NOTE — Telephone Encounter (Signed)
Can't find prescription re send.

## 2015-04-02 NOTE — Telephone Encounter (Signed)
Patient left message stating that [facility] pharmacy has her prescription for her LORazepam (ATIVAN) 0.5 MG tablet.

## 2015-04-03 NOTE — Telephone Encounter (Signed)
Script faxed to [facility] [address].

## 2015-04-08 MED FILL — EXEMESTANE 25 MG TABLET: 25 | 30 days supply | Qty: 30 | Fill #4

## 2015-04-09 ENCOUNTER — Other Ambulatory Visit: Payer: Self-pay

## 2015-04-09 MED ORDER — AMLODIPINE BESYLATE 5 MG PO TABS: 5.0000 mg | ORAL_TABLET | Freq: Every day | ORAL | Status: DC

## 2015-04-09 MED FILL — AMLODIPINE BESYLATE 5 MG TA: 5 | 90 days supply | Qty: 90 | Fill #0

## 2015-04-28 ENCOUNTER — Ambulatory Visit (HOSPITAL_BASED_OUTPATIENT_CLINIC_OR_DEPARTMENT_OTHER): Payer: 59 | Admitting: Hematology and Oncology

## 2015-04-28 ENCOUNTER — Ambulatory Visit: Payer: 59

## 2015-04-28 ENCOUNTER — Encounter: Payer: Self-pay | Admitting: Hematology and Oncology

## 2015-04-28 ENCOUNTER — Telehealth: Payer: Self-pay | Admitting: Hematology and Oncology

## 2015-04-28 VITALS — BP 150/85 | HR 80 | Temp 97.9°F | Resp 18 | Ht 64.0 in | Wt 167.0 lb

## 2015-04-28 DIAGNOSIS — C50412 Malignant neoplasm of upper-outer quadrant of left female breast: Secondary | ICD-10-CM | POA: Diagnosis not present

## 2015-04-28 DIAGNOSIS — N951 Menopausal and female climacteric states: Secondary | ICD-10-CM

## 2015-04-28 NOTE — Progress Notes (Signed)
Patient Care Team: Mary Mc, MD as PCP - General (Internal Medicine)  DIAGNOSIS: Primary cancer of upper outer quadrant of left female breast [facility])   Staging form: Breast, AJCC 7th Edition     Clinical stage from 11/23/2011: Stage IIIA (T3, N1, cM0) - Signed by Mary Edison, MD on 11/23/2011     Pathologic: No stage assigned - Unsigned   SUMMARY OF ONCOLOGIC HISTORY:   Primary cancer of upper outer quadrant of left female breast ([name])   11/17/2011 Initial Diagnosis Cancer of upper-outer quadrant of female breast diagnosed when pt presented with Nipple flattening and palpable lesion.   12/08/2011 - 03/29/2012 Chemotherapy Neo adjuvant FEC X 4 followed by Weekly Taxol X 12   05/18/2012 Surgery Bilateral Mastectomies: Right revealed invasive  Lobular carcinoma measuring 2.3 cm 8 of 17 lymph nodes were positive (T2 N2A). 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   07/04/2012 - 08/17/2012 Radiation Therapy Rt Breast irradiation   08/19/2012 -  Anti-estrogen oral therapy Aromasin + Zolodex Monthly    CHIEF COMPLIANT:  Follow-up on Aromasin and Zoladex  INTERVAL HISTORY: Mary [name] is a  55 year old with above-mentioned history of bilateral mastectomies 1 went neoadjuvant chemotherapy surgery followed by radiation and is currently on antiestrogen therapy with Aromasin and Zoladex. She has performed hot flashes but she is able to manage her life around it. Beyond that she is tolerating the treatment fairly well. Denies any lumps or nodules of any concern.  REVIEW OF SYSTEMS:   Constitutional: Denies fevers, chills or abnormal weight loss Eyes: Denies blurriness of vision Ears, nose, mouth, throat, and face: Denies mucositis or sore throat Respiratory: Denies cough, dyspnea or wheezes Cardiovascular: Denies palpitation, chest discomfort Gastrointestinal:  Denies nausea, heartburn or change in bowel habits Skin: Denies abnormal skin  rashes Lymphatics: Denies new lymphadenopathy or easy bruising Neurological:Denies numbness, tingling or new weaknesses Behavioral/Psych: Mood is stable, no new changes  Extremities: No lower extremity edema Breast:  denies any pain or lumps or nodules in either  Chest wall or axilla All other systems were reviewed with the patient and are negative.  I have reviewed the past medical history, past surgical history, social history and family history with the patient and they are unchanged from previous note.  ALLERGIES:  is allergic to adhesive.  MEDICATIONS:  Current Outpatient Prescriptions  Medication Sig Dispense Refill  . ALPRAZolam (XANAX) 0.25 MG tablet TAKE ONE TABLET BY MOUTH TWICE DAILY AS NEEDED FOR ANXIETY 60 tablet 4  . amLODipine (NORVASC) 5 MG tablet Take 1 tablet (5 mg total) by mouth daily. 90 tablet 1  . Ascorbic Acid (VITAMIN C) 100 MG tablet Take 100 mg by mouth daily.      . Calcium Carbonate-Vitamin D (CALTRATE 600+D PO) Take 1 tablet by mouth daily.    . cholecalciferol (VITAMIN D) 400 UNITS TABS Take 1,000 Units by mouth daily.     Marland Kitchen esomeprazole (NEXIUM) 40 MG capsule Take 40 mg by mouth daily before breakfast.    . exemestane (AROMASIN) 25 MG tablet TAKE 1 TABLET BY MOUTH ONCE DAILY AFTER BREAKFAST 30 tablet 11  . goserelin (ZOLADEX) 3.6 MG injection Inject 3.6 mg into the skin every 28 (twenty-eight) days.    Marland Kitchen loperamide (IMODIUM A-D) 2 MG tablet Take 2 mg by mouth 4 (four) times daily as needed for diarrhea or loose stools.     . Multiple Vitamin (MULTIVITAMIN) tablet Take 1 tablet by  mouth daily.       No current facility-administered medications for this visit.    PHYSICAL EXAMINATION: ECOG PERFORMANCE STATUS: 1 - Symptomatic but completely ambulatory  Filed Vitals:   04/28/15 1054  BP: 150/85  Pulse: 80  Temp: 97.9 F (36.6 C)  Resp: 18   Filed Weights   04/28/15 1054  Weight: 167 lb (75.751 kg)    GENERAL:alert, no distress and  comfortable SKIN: skin color, texture, turgor are normal, no rashes or significant lesions EYES: normal, Conjunctiva are pink and non-injected, sclera clear OROPHARYNX:no exudate, no erythema and lips, buccal mucosa, and tongue normal  NECK: supple, thyroid normal size, non-tender, without nodularity LYMPH:  no palpable lymphadenopathy in the cervical, axillary or inguinal LUNGS: clear to auscultation and percussion with normal breathing effort HEART: regular rate & rhythm and no murmurs and no lower extremity edema ABDOMEN:abdomen soft, non-tender and normal bowel sounds MUSCULOSKELETAL:no cyanosis of digits and no clubbing  NEURO: alert & oriented x 3 with fluent speech, no focal motor/sensory deficits EXTREMITIES: No lower extremity edema BREAST no palpable lumps or nodules in chest wall or axilla. (exam performed in the presence of a chaperone)  LABORATORY DATA:  I have reviewed the data as listed   Chemistry      Component Value Date/Time   NA 141 09/19/2014 1039   NA 141 04/29/2014 1327   K 3.7 09/19/2014 1039   K 4.0 04/29/2014 1327   CL 103 09/19/2014 1039   CL 106 08/20/2012 0802   CO2 26 09/19/2014 1039   CO2 24 04/29/2014 1327   BUN 16 09/19/2014 1039   BUN 15.7 04/29/2014 1327   CREATININE 0.93 09/19/2014 1039   CREATININE 0.9 04/29/2014 1327   CREATININE 0.84 12/06/2013 1602      Component Value Date/Time   CALCIUM 9.7 09/19/2014 1039   CALCIUM 9.7 04/29/2014 1327   ALKPHOS 108 09/19/2014 1039   ALKPHOS 124 04/29/2014 1327   AST 18 09/19/2014 1039   AST 21 04/29/2014 1327   ALT 21 09/19/2014 1039   ALT 30 04/29/2014 1327   BILITOT 0.6 09/19/2014 1039   BILITOT 0.34 04/29/2014 1327       Lab Results  Component Value Date   WBC 6.4 04/29/2014   HGB 14.8 04/29/2014   HCT 43.8 04/29/2014   MCV 95.0 04/29/2014   PLT 206 04/29/2014   NEUTROABS 3.9 04/29/2014     ASSESSMENT & PLAN:  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 Occasional hot flashes and night sweats  2. Bone density showed T score -0.2 and -0.3.  Recommendation: 1.  Hold off on Zoladex injections and check blood work in 2 months for [facility] and estradiol  To see if she is menopausal. 2. Continue  Aromasin We cannot determine whether she is menopausal or not because she is currently on Zoladex injections.   If she is menopausal, then we can discontinue Zoladex injections.  Breast cancer surveillance: 1. No role of imaging studies since she had bilateral mastectomies 2. Chest wall and axillary examination  04/28/2015 is normal  Return to clinic in  3 months to see if she is not menopausal range so that we can then discontinue Zoladex injections.  No orders of the defined types were placed in this encounter.   The patient has a good understanding of the overall plan. she agrees with it. she will call with any problems that may develop before the next visit here.   Mary Eisenmenger, MD 04/28/2015

## 2015-04-28 NOTE — Telephone Encounter (Signed)
Appointments made and avs printed °

## 2015-04-28 NOTE — Assessment & Plan Note (Signed)
Right breast invasive lobular carcinoma ER 95%, PR 53 %, Ki 67 85%, HER-2 negative with lymphovascular invasion status post neoadjuvant chemotherapy followed by bilateral mastectomies 05/18/2012 showed invasive lobular cancer 2.3 cm, 8/17 lymph nodes positive T2 N2 M0 stage IIIa;  left breast showed DCIS Status post right breast radiation currently on tamoxifen with Zoladex monthly Started 08/19/2012  Tamoxifen toxicities: 1. Occasional hot flashes and night sweats  2. Bone density showed T score -0.2 and -0.3.  Recommendation: 1. We will continue zolodex injections monthly 2. Continue hormonal therapy We cannot determine whether she is menopausal or not because she is currently on Zoladex injections.   Breast cancer surveillance: 1. No role of imaging studies since she had bilateral mastectomies 2. Chest wall and axillary examination  04/28/2015 is normal  Return to clinic in 6 months for follow-up

## 2015-05-21 ENCOUNTER — Encounter: Payer: Self-pay | Admitting: Hematology and Oncology

## 2015-05-21 NOTE — Progress Notes (Signed)
forms left in box °

## 2015-05-26 ENCOUNTER — Ambulatory Visit: Payer: 59

## 2015-05-27 ENCOUNTER — Encounter: Payer: Self-pay | Admitting: Hematology and Oncology

## 2015-05-27 NOTE — Progress Notes (Signed)
mailing to the pateint-no # to fax.

## 2015-05-28 ENCOUNTER — Encounter: Payer: Self-pay | Admitting: Hematology and Oncology

## 2015-05-28 NOTE — Progress Notes (Signed)
Faxed to  [phone]

## 2015-06-05 DIAGNOSIS — J069 Acute upper respiratory infection, unspecified: Secondary | ICD-10-CM | POA: Diagnosis not present

## 2015-06-23 ENCOUNTER — Ambulatory Visit: Payer: 59

## 2015-06-25 ENCOUNTER — Other Ambulatory Visit: Payer: Self-pay

## 2015-06-25 DIAGNOSIS — C50412 Malignant neoplasm of upper-outer quadrant of left female breast: Secondary | ICD-10-CM

## 2015-06-26 ENCOUNTER — Other Ambulatory Visit (HOSPITAL_BASED_OUTPATIENT_CLINIC_OR_DEPARTMENT_OTHER): Payer: 59

## 2015-06-26 DIAGNOSIS — C50412 Malignant neoplasm of upper-outer quadrant of left female breast: Secondary | ICD-10-CM | POA: Diagnosis not present

## 2015-06-27 LAB — FOLLICLE STIMULATING HORMONE: FSH: 14.6 m[IU]/mL

## 2015-06-27 LAB — ESTRADIOL: Estradiol: 15 pg/mL

## 2015-07-21 ENCOUNTER — Ambulatory Visit: Payer: 59

## 2015-07-24 ENCOUNTER — Other Ambulatory Visit: Payer: Self-pay | Admitting: Hematology and Oncology

## 2015-07-24 DIAGNOSIS — C50412 Malignant neoplasm of upper-outer quadrant of left female breast: Secondary | ICD-10-CM

## 2015-07-24 MED FILL — EXEMESTANE 25 MG TABLET: 25 | 90 days supply | Qty: 90 | Fill #0

## 2015-07-24 MED FILL — AMLODIPINE BESYLATE 5 MG TA: 5 | 90 days supply | Qty: 90 | Fill #1

## 2015-07-27 ENCOUNTER — Ambulatory Visit (HOSPITAL_BASED_OUTPATIENT_CLINIC_OR_DEPARTMENT_OTHER): Payer: 59 | Admitting: Hematology and Oncology

## 2015-07-27 ENCOUNTER — Encounter: Payer: Self-pay | Admitting: Hematology and Oncology

## 2015-07-27 ENCOUNTER — Telehealth: Payer: Self-pay | Admitting: Hematology and Oncology

## 2015-07-27 VITALS — BP 134/78 | HR 81 | Temp 98.0°F | Resp 18 | Wt 165.1 lb

## 2015-07-27 DIAGNOSIS — Z5111 Encounter for antineoplastic chemotherapy: Secondary | ICD-10-CM

## 2015-07-27 DIAGNOSIS — C50412 Malignant neoplasm of upper-outer quadrant of left female breast: Secondary | ICD-10-CM

## 2015-07-27 DIAGNOSIS — C779 Secondary and unspecified malignant neoplasm of lymph node, unspecified: Secondary | ICD-10-CM

## 2015-07-27 MED ORDER — GOSERELIN ACETATE 3.6 MG ~~LOC~~ IMPL
3.6000 mg | DRUG_IMPLANT | Freq: Once | SUBCUTANEOUS | Status: AC
  Administered 2015-07-27: 3.6 mg via SUBCUTANEOUS
  Filled 2015-07-27: qty 3.6

## 2015-07-27 NOTE — Patient Instructions (Signed)

## 2015-07-27 NOTE — Telephone Encounter (Signed)
appt made and avs printed °

## 2015-07-27 NOTE — Assessment & Plan Note (Signed)
Right breast invasive lobular carcinoma ER 95%, PR 53 %, Ki 67 85%, HER-2 negative with lymphovascular invasion status post neoadjuvant chemotherapy followed by bilateral mastectomies 05/18/2012 showed invasive lobular cancer 2.3 cm, 8/17 lymph nodes positive T2 N2 M0 stage IIIa;  left breast showed DCIS Status post right breast radiation currently on tamoxifen with Zoladex monthly Started 08/19/2012  Tamoxifen toxicities: 1. Occasional hot flashes and night sweats  2. Bone density showed T score -0.2 and -0.3.  Recommendation: 1. Hold off on Zoladex injections and check blood work in 2 months for [facility] and estradiol To see if she is menopausal. 2. Continue Aromasin We cannot determine whether she is menopausal or not because she is currently on Zoladex injections. If she is menopausal, then we can discontinue Zoladex injections.  Breast cancer surveillance: 1. No role of imaging studies since she had bilateral mastectomies 2. Chest wall and axillary examination 04/28/2015 is normal  Deficit and estradiol were not in the menopausal range done on 06/26/2015 ([place] 14.6, estradiol 15). I recommended continuation of Zoladex injections along with aromatase inhibitor therapy.

## 2015-07-27 NOTE — Progress Notes (Signed)
Patient Care Team: [name], MD as PCP - General (Internal Medicine)  DIAGNOSIS: Primary cancer of upper outer quadrant of left female breast [facility])   Staging form: Breast, AJCC 7th Edition     Clinical stage from 11/23/2011: Stage IIIA (T3, N1, cM0) - Signed by [name], MD on 11/23/2011     Pathologic: No stage assigned - Unsigned   SUMMARY OF ONCOLOGIC HISTORY:   Primary cancer of upper outer quadrant of left female breast ([place])   11/17/2011 Initial Diagnosis Cancer of upper-outer quadrant of female breast diagnosed when pt presented with Nipple flattening and palpable lesion.   12/08/2011 - 03/29/2012 Chemotherapy Neo adjuvant FEC X 4 followed by Weekly Taxol X 12   05/18/2012 Surgery Bilateral Mastectomies: Right revealed invasive  Lobular carcinoma measuring 2.3 cm 8 of 17 lymph nodes were positive (T2 N2A). 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   07/04/2012 - 08/17/2012 Radiation Therapy Rt Breast irradiation   08/19/2012 -  Anti-estrogen oral therapy Aromasin + Zolodex Monthly    CHIEF COMPLIANT: follow-up of breast cancer  INTERVAL HISTORY: Mary [name] is a this 55 year old with above-mentioned history of left breast cancer treated with neoadjuvant chemotherapy followed by bilateral mastectomies and radiation in this enough on Aromasin and Zoladex. She took a few months of break after the last treatment to test if she is menopausal. Results of [place] and estradiol did not reveal any evidence of menopause. She is here to discuss about restarting back on Zoladex injections.  REVIEW OF SYSTEMS:   Constitutional: Denies fevers, chills or abnormal weight loss Eyes: Denies blurriness of vision Ears, nose, mouth, throat, and face: Denies mucositis or sore throat Respiratory: Denies cough, dyspnea or wheezes Cardiovascular: Denies palpitation, chest discomfort Gastrointestinal:  Denies nausea, heartburn or change in bowel  habits Skin: Denies abnormal skin rashes Lymphatics: Denies new lymphadenopathy or easy bruising Neurological:Denies numbness, tingling or new weaknesses Behavioral/Psych: Mood is stable, no new changes  Extremities: No lower extremity edema Breast:  denies any pain or lumps or nodules in either breasts All other systems were reviewed with the patient and are negative.  I have reviewed the past medical history, past surgical history, social history and family history with the patient and they are unchanged from previous note.  ALLERGIES:  is allergic to adhesive.  MEDICATIONS:  Current Outpatient Prescriptions  Medication Sig Dispense Refill  . ALPRAZolam (XANAX) 0.25 MG tablet TAKE ONE TABLET BY MOUTH TWICE DAILY AS NEEDED FOR ANXIETY 60 tablet 4  . amLODipine (NORVASC) 5 MG tablet Take 1 tablet (5 mg total) by mouth daily. 90 tablet 1  . Ascorbic Acid (VITAMIN C) 100 MG tablet Take 100 mg by mouth daily.      . Calcium Carbonate-Vitamin D (CALTRATE 600+D PO) Take 1 tablet by mouth daily.    . cholecalciferol (VITAMIN D) 400 UNITS TABS Take 1,000 Units by mouth daily.     Marland Kitchen esomeprazole (NEXIUM) 40 MG capsule Take 40 mg by mouth daily before breakfast.    . exemestane (AROMASIN) 25 MG tablet TAKE 1 TABLET BY MOUTH ONCE DAILY AFTER BREAKFAST 90 tablet 3  . goserelin (ZOLADEX) 3.6 MG injection Inject 3.6 mg into the skin every 28 (twenty-eight) days.    Marland Kitchen loperamide (IMODIUM A-D) 2 MG tablet Take 2 mg by mouth 4 (four) times daily as needed for diarrhea or loose stools.     . Multiple Vitamin (MULTIVITAMIN) tablet Take 1  tablet by mouth daily.       No current facility-administered medications for this visit.    PHYSICAL EXAMINATION: ECOG PERFORMANCE STATUS: 1 - Symptomatic but completely ambulatory  Filed Vitals:   07/27/15 1114  BP: 134/78  Pulse: 81  Temp: 98 F (36.7 C)  Resp: 18   Filed Weights   07/27/15 1114  Weight: 165 lb 1.6 oz (74.889 kg)    GENERAL:alert, no  distress and comfortable SKIN: skin color, texture, turgor are normal, no rashes or significant lesions EYES: normal, Conjunctiva are pink and non-injected, sclera clear OROPHARYNX:no exudate, no erythema and lips, buccal mucosa, and tongue normal  NECK: supple, thyroid normal size, non-tender, without nodularity LYMPH:  no palpable lymphadenopathy in the cervical, axillary or inguinal LUNGS: clear to auscultation and percussion with normal breathing effort HEART: regular rate & rhythm and no murmurs and no lower extremity edema ABDOMEN:abdomen soft, non-tender and normal bowel sounds MUSCULOSKELETAL:no cyanosis of digits and no clubbing  NEURO: alert & oriented x 3 with fluent speech, no focal motor/sensory deficits EXTREMITIES: No lower extremity edema BREAST: No palpable masses or nodules in either right or left breasts. No palpable axillary supraclavicular or infraclavicular adenopathy no breast tenderness or nipple discharge. (exam performed in the presence of a chaperone)  LABORATORY DATA:  I have reviewed the data as listed   Chemistry      Component Value Date/Time   NA 141 09/19/2014 1039   NA 141 04/29/2014 1327   K 3.7 09/19/2014 1039   K 4.0 04/29/2014 1327   CL 103 09/19/2014 1039   CL 106 08/20/2012 0802   CO2 26 09/19/2014 1039   CO2 24 04/29/2014 1327   BUN 16 09/19/2014 1039   BUN 15.7 04/29/2014 1327   CREATININE 0.93 09/19/2014 1039   CREATININE 0.9 04/29/2014 1327   CREATININE 0.84 12/06/2013 1602      Component Value Date/Time   CALCIUM 9.7 09/19/2014 1039   CALCIUM 9.7 04/29/2014 1327   ALKPHOS 108 09/19/2014 1039   ALKPHOS 124 04/29/2014 1327   AST 18 09/19/2014 1039   AST 21 04/29/2014 1327   ALT 21 09/19/2014 1039   ALT 30 04/29/2014 1327   BILITOT 0.6 09/19/2014 1039   BILITOT 0.34 04/29/2014 1327       Lab Results  Component Value Date   WBC 6.4 04/29/2014   HGB 14.8 04/29/2014   HCT 43.8 04/29/2014   MCV 95.0 04/29/2014   PLT 206  04/29/2014   NEUTROABS 3.9 04/29/2014     ASSESSMENT & PLAN:  Primary cancer of upper outer quadrant of left female breast Right breast invasive lobular carcinoma ER 95%, PR 53 %, Ki 67 85%, HER-2 negative with lymphovascular invasion status post neoadjuvant chemotherapy followed by bilateral mastectomies 05/18/2012 showed invasive lobular cancer 2.3 cm, 8/17 lymph nodes positive T2 N2 M0 stage IIIa;  left breast showed DCIS Status post right breast radiation currently on tamoxifen with Zoladex monthly Started 08/19/2012  Tamoxifen toxicities: 1. Occasional hot flashes and night sweats  2. Bone density showed T score -0.2 and -0.3.  Recommendation: 1. Hold off on Zoladex injections and check blood work in 2 months for [facility] and estradiol To see if she is menopausal. 2. Continue Aromasin We cannot determine whether she is menopausal or not because she is currently on Zoladex injections. If she is menopausal, then we can discontinue Zoladex injections.  Breast cancer surveillance: 1. No role of imaging studies since she had bilateral mastectomies 2. Chest wall  and axillary examination 04/28/2015 is normal  Deficit and estradiol were not in the menopausal range done on 06/26/2015 ([place] 14.6, estradiol 15). I recommended continuation of Zoladex injections along with aromatase inhibitor therapy.  Return to clinic in 1 year No orders of the defined types were placed in this encounter.   The patient has a good understanding of the overall plan. she agrees with it. she will call with any problems that may develop before the next visit here.   Rulon Eisenmenger, MD 07/27/2015

## 2015-08-07 ENCOUNTER — Encounter: Payer: Self-pay | Admitting: Hematology and Oncology

## 2015-08-07 NOTE — Progress Notes (Signed)
Fax sent 05/28/15 I sent to medical records

## 2015-08-18 ENCOUNTER — Ambulatory Visit: Payer: 59

## 2015-08-18 DIAGNOSIS — C50412 Malignant neoplasm of upper-outer quadrant of left female breast: Secondary | ICD-10-CM

## 2015-08-18 MED ORDER — GOSERELIN ACETATE 3.6 MG ~~LOC~~ IMPL: 3.6000 mg | DRUG_IMPLANT | Freq: Once | SUBCUTANEOUS | Status: DC

## 2015-08-18 NOTE — Progress Notes (Signed)
Pt will reschedule appt. Too soon for injection

## 2015-08-24 ENCOUNTER — Ambulatory Visit (HOSPITAL_BASED_OUTPATIENT_CLINIC_OR_DEPARTMENT_OTHER): Payer: 59

## 2015-08-24 VITALS — BP 156/88 | HR 81 | Temp 98.0°F | Resp 20

## 2015-08-24 DIAGNOSIS — Z5111 Encounter for antineoplastic chemotherapy: Secondary | ICD-10-CM

## 2015-08-24 DIAGNOSIS — C779 Secondary and unspecified malignant neoplasm of lymph node, unspecified: Secondary | ICD-10-CM

## 2015-08-24 DIAGNOSIS — C50412 Malignant neoplasm of upper-outer quadrant of left female breast: Secondary | ICD-10-CM | POA: Diagnosis not present

## 2015-08-24 MED ORDER — GOSERELIN ACETATE 3.6 MG ~~LOC~~ IMPL
3.6000 mg | DRUG_IMPLANT | Freq: Once | SUBCUTANEOUS | Status: AC
  Administered 2015-08-24: 3.6 mg via SUBCUTANEOUS
  Filled 2015-08-24: qty 3.6

## 2015-08-24 NOTE — Patient Instructions (Signed)
Goserelin injection What is this medicine? GOSERELIN (GOE se rel in) is similar to a hormone found in the body. It lowers the amount of sex hormones that the body makes. Men will have lower testosterone levels and women will have lower estrogen levels while taking this medicine. In men, this medicine is used to treat prostate cancer; the injection is either given once per month or once every 12 weeks. A once per month injection (only) is used to treat women with endometriosis, dysfunctional uterine bleeding, or advanced breast cancer. This medicine may be used for other purposes; ask your health care provider or pharmacist if you have questions. What should I tell my health care provider before I take this medicine? They need to know if you have any of these conditions (some only apply to women): -diabetes -heart disease or previous heart attack -high blood pressure -high cholesterol -kidney disease -osteoporosis or low bone density -problems passing urine -spinal cord injury -stroke -tobacco smoker -an unusual or allergic reaction to goserelin, hormone therapy, other medicines, foods, dyes, or preservatives -pregnant or trying to get pregnant -breast-feeding How should I use this medicine? This medicine is for injection under the skin. It is given by a health care professional in a hospital or clinic setting. Men receive this injection once every 4 weeks or once every 12 weeks. Women will only receive the once every 4 weeks injection. Talk to your pediatrician regarding the use of this medicine in children. Special care may be needed. Overdosage: If you think you have taken too much of this medicine contact a poison control center or emergency room at once. NOTE: This medicine is only for you. Do not share this medicine with others. What if I miss a dose? It is important not to miss your dose. Call your doctor or health care professional if you are unable to keep an appointment. What may  interact with this medicine? -female hormones like estrogen -herbal or dietary supplements like black cohosh, chasteberry, or DHEA -female hormones like testosterone -prasterone This list may not describe all possible interactions. Give your health care provider a list of all the medicines, herbs, non-prescription drugs, or dietary supplements you use. Also tell them if you smoke, drink alcohol, or use illegal drugs. Some items may interact with your medicine. What should I watch for while using this medicine? Visit your doctor or health care professional for regular checks on your progress. Your symptoms may appear to get worse during the first weeks of this therapy. Tell your doctor or healthcare professional if your symptoms do not start to get better or if they get worse after this time. Your bones may get weaker if you take this medicine for a long time. If you smoke or frequently drink alcohol you may increase your risk of bone loss. A family history of osteoporosis, chronic use of drugs for seizures (convulsions), or corticosteroids can also increase your risk of bone loss. Talk to your doctor about how to keep your bones strong. This medicine should stop regular monthly menstration in women. Tell your doctor if you continue to menstrate. Women should not become pregnant while taking this medicine or for 12 weeks after stopping this medicine. Women should inform their doctor if they wish to become pregnant or think they might be pregnant. There is a potential for serious side effects to an unborn child. Talk to your health care professional or pharmacist for more information. Do not breast-feed an infant while taking this medicine. Men should   inform their doctors if they wish to father a child. This medicine may lower sperm counts. Talk to your health care professional or pharmacist for more information. What side effects may I notice from receiving this medicine? Side effects that you should  report to your doctor or health care professional as soon as possible: -allergic reactions like skin rash, itching or hives, swelling of the face, lips, or tongue -bone pain -breathing problems -changes in vision -chest pain -feeling faint or lightheaded, falls -fever, chills -pain, swelling, warmth in the leg -pain, tingling, numbness in the hands or feet -signs and symptoms of low blood pressure like dizziness; feeling faint or lightheaded, falls; unusually weak or tired -stomach pain -swelling of the ankles, feet, hands -trouble passing urine or change in the amount of urine -unusually high or low blood pressure -unusually weak or tired Side effects that usually do not require medical attention (report to your doctor or health care professional if they continue or are bothersome): -change in sex drive or performance -changes in breast size in both males and females -changes in emotions or moods -headache -hot flashes -irritation at site where injected -loss of appetite -skin problems like acne, dry skin -vaginal dryness This list may not describe all possible side effects. Call your doctor for medical advice about side effects. You may report side effects to FDA at 1-800-FDA-1088. Where should I keep my medicine? This drug is given in a hospital or clinic and will not be stored at home. NOTE: This sheet is a summary. It may not cover all possible information. If you have questions about this medicine, talk to your doctor, pharmacist, or health care provider.    2016, Elsevier/Gold Standard. (2013-05-14 11:10:35)  

## 2015-09-15 ENCOUNTER — Ambulatory Visit: Payer: 59

## 2015-09-21 ENCOUNTER — Ambulatory Visit (HOSPITAL_BASED_OUTPATIENT_CLINIC_OR_DEPARTMENT_OTHER): Payer: 59

## 2015-09-21 VITALS — BP 148/81 | HR 84 | Temp 98.0°F | Resp 20

## 2015-09-21 DIAGNOSIS — Z5111 Encounter for antineoplastic chemotherapy: Secondary | ICD-10-CM

## 2015-09-21 DIAGNOSIS — C50412 Malignant neoplasm of upper-outer quadrant of left female breast: Secondary | ICD-10-CM

## 2015-09-21 DIAGNOSIS — C779 Secondary and unspecified malignant neoplasm of lymph node, unspecified: Secondary | ICD-10-CM | POA: Diagnosis not present

## 2015-09-21 MED ORDER — GOSERELIN ACETATE 3.6 MG ~~LOC~~ IMPL
3.6000 mg | DRUG_IMPLANT | Freq: Once | SUBCUTANEOUS | Status: AC
Start: 2015-09-21 — End: 2015-09-21
  Administered 2015-09-21: 3.6 mg via SUBCUTANEOUS
  Filled 2015-09-21: qty 3.6

## 2015-09-21 NOTE — Patient Instructions (Signed)
Goserelin injection What is this medicine? GOSERELIN (GOE se rel in) is similar to a hormone found in the body. It lowers the amount of sex hormones that the body makes. Men will have lower testosterone levels and women will have lower estrogen levels while taking this medicine. In men, this medicine is used to treat prostate cancer; the injection is either given once per month or once every 12 weeks. A once per month injection (only) is used to treat women with endometriosis, dysfunctional uterine bleeding, or advanced breast cancer. This medicine may be used for other purposes; ask your health care provider or pharmacist if you have questions. What should I tell my health care provider before I take this medicine? They need to know if you have any of these conditions (some only apply to women): -diabetes -heart disease or previous heart attack -high blood pressure -high cholesterol -kidney disease -osteoporosis or low bone density -problems passing urine -spinal cord injury -stroke -tobacco smoker -an unusual or allergic reaction to goserelin, hormone therapy, other medicines, foods, dyes, or preservatives -pregnant or trying to get pregnant -breast-feeding How should I use this medicine? This medicine is for injection under the skin. It is given by a health care professional in a hospital or clinic setting. Men receive this injection once every 4 weeks or once every 12 weeks. Women will only receive the once every 4 weeks injection. Talk to your pediatrician regarding the use of this medicine in children. Special care may be needed. Overdosage: If you think you have taken too much of this medicine contact a poison control center or emergency room at once. NOTE: This medicine is only for you. Do not share this medicine with others. What if I miss a dose? It is important not to miss your dose. Call your doctor or health care professional if you are unable to keep an appointment. What may  interact with this medicine? -female hormones like estrogen -herbal or dietary supplements like black cohosh, chasteberry, or DHEA -female hormones like testosterone -prasterone This list may not describe all possible interactions. Give your health care provider a list of all the medicines, herbs, non-prescription drugs, or dietary supplements you use. Also tell them if you smoke, drink alcohol, or use illegal drugs. Some items may interact with your medicine. What should I watch for while using this medicine? Visit your doctor or health care professional for regular checks on your progress. Your symptoms may appear to get worse during the first weeks of this therapy. Tell your doctor or healthcare professional if your symptoms do not start to get better or if they get worse after this time. Your bones may get weaker if you take this medicine for a long time. If you smoke or frequently drink alcohol you may increase your risk of bone loss. A family history of osteoporosis, chronic use of drugs for seizures (convulsions), or corticosteroids can also increase your risk of bone loss. Talk to your doctor about how to keep your bones strong. This medicine should stop regular monthly menstration in women. Tell your doctor if you continue to menstrate. Women should not become pregnant while taking this medicine or for 12 weeks after stopping this medicine. Women should inform their doctor if they wish to become pregnant or think they might be pregnant. There is a potential for serious side effects to an unborn child. Talk to your health care professional or pharmacist for more information. Do not breast-feed an infant while taking this medicine. Men should   inform their doctors if they wish to father a child. This medicine may lower sperm counts. Talk to your health care professional or pharmacist for more information. What side effects may I notice from receiving this medicine? Side effects that you should  report to your doctor or health care professional as soon as possible: -allergic reactions like skin rash, itching or hives, swelling of the face, lips, or tongue -bone pain -breathing problems -changes in vision -chest pain -feeling faint or lightheaded, falls -fever, chills -pain, swelling, warmth in the leg -pain, tingling, numbness in the hands or feet -signs and symptoms of low blood pressure like dizziness; feeling faint or lightheaded, falls; unusually weak or tired -stomach pain -swelling of the ankles, feet, hands -trouble passing urine or change in the amount of urine -unusually high or low blood pressure -unusually weak or tired Side effects that usually do not require medical attention (report to your doctor or health care professional if they continue or are bothersome): -change in sex drive or performance -changes in breast size in both males and females -changes in emotions or moods -headache -hot flashes -irritation at site where injected -loss of appetite -skin problems like acne, dry skin -vaginal dryness This list may not describe all possible side effects. Call your doctor for medical advice about side effects. You may report side effects to FDA at 1-800-FDA-1088. Where should I keep my medicine? This drug is given in a hospital or clinic and will not be stored at home. NOTE: This sheet is a summary. It may not cover all possible information. If you have questions about this medicine, talk to your doctor, pharmacist, or health care provider.    2016, Elsevier/Gold Standard. (2013-05-14 11:10:35)  

## 2015-10-13 ENCOUNTER — Ambulatory Visit: Payer: 59

## 2015-10-19 ENCOUNTER — Other Ambulatory Visit: Payer: Self-pay | Admitting: Internal Medicine

## 2015-10-19 ENCOUNTER — Ambulatory Visit (HOSPITAL_BASED_OUTPATIENT_CLINIC_OR_DEPARTMENT_OTHER): Payer: 59

## 2015-10-19 VITALS — BP 137/76 | HR 74 | Temp 98.3°F | Resp 18

## 2015-10-19 DIAGNOSIS — C50412 Malignant neoplasm of upper-outer quadrant of left female breast: Secondary | ICD-10-CM

## 2015-10-19 DIAGNOSIS — Z5111 Encounter for antineoplastic chemotherapy: Secondary | ICD-10-CM | POA: Diagnosis not present

## 2015-10-19 DIAGNOSIS — C779 Secondary and unspecified malignant neoplasm of lymph node, unspecified: Secondary | ICD-10-CM | POA: Diagnosis not present

## 2015-10-19 MED ORDER — GOSERELIN ACETATE 3.6 MG ~~LOC~~ IMPL
3.6000 mg | DRUG_IMPLANT | Freq: Once | SUBCUTANEOUS | Status: AC
  Administered 2015-10-19: 3.6 mg via SUBCUTANEOUS
  Filled 2015-10-19: qty 3.6

## 2015-10-19 MED FILL — AMLODIPINE BESYLATE 5 MG TA: 5 | 30 days supply | Qty: 30 | Fill #0

## 2015-10-19 NOTE — Patient Instructions (Signed)
Goserelin injection What is this medicine? GOSERELIN (GOE se rel in) is similar to a hormone found in the body. It lowers the amount of sex hormones that the body makes. Men will have lower testosterone levels and women will have lower estrogen levels while taking this medicine. In men, this medicine is used to treat prostate cancer; the injection is either given once per month or once every 12 weeks. A once per month injection (only) is used to treat women with endometriosis, dysfunctional uterine bleeding, or advanced breast cancer. This medicine may be used for other purposes; ask your health care provider or pharmacist if you have questions. What should I tell my health care provider before I take this medicine? They need to know if you have any of these conditions (some only apply to women): -diabetes -heart disease or previous heart attack -high blood pressure -high cholesterol -kidney disease -osteoporosis or low bone density -problems passing urine -spinal cord injury -stroke -tobacco smoker -an unusual or allergic reaction to goserelin, hormone therapy, other medicines, foods, dyes, or preservatives -pregnant or trying to get pregnant -breast-feeding How should I use this medicine? This medicine is for injection under the skin. It is given by a health care professional in a hospital or clinic setting. Men receive this injection once every 4 weeks or once every 12 weeks. Women will only receive the once every 4 weeks injection. Talk to your pediatrician regarding the use of this medicine in children. Special care may be needed. Overdosage: If you think you have taken too much of this medicine contact a poison control center or emergency room at once. NOTE: This medicine is only for you. Do not share this medicine with others. What if I miss a dose? It is important not to miss your dose. Call your doctor or health care professional if you are unable to keep an appointment. What may  interact with this medicine? -female hormones like estrogen -herbal or dietary supplements like black cohosh, chasteberry, or DHEA -female hormones like testosterone -prasterone This list may not describe all possible interactions. Give your health care provider a list of all the medicines, herbs, non-prescription drugs, or dietary supplements you use. Also tell them if you smoke, drink alcohol, or use illegal drugs. Some items may interact with your medicine. What should I watch for while using this medicine? Visit your doctor or health care professional for regular checks on your progress. Your symptoms may appear to get worse during the first weeks of this therapy. Tell your doctor or healthcare professional if your symptoms do not start to get better or if they get worse after this time. Your bones may get weaker if you take this medicine for a long time. If you smoke or frequently drink alcohol you may increase your risk of bone loss. A family history of osteoporosis, chronic use of drugs for seizures (convulsions), or corticosteroids can also increase your risk of bone loss. Talk to your doctor about how to keep your bones strong. This medicine should stop regular monthly menstration in women. Tell your doctor if you continue to menstrate. Women should not become pregnant while taking this medicine or for 12 weeks after stopping this medicine. Women should inform their doctor if they wish to become pregnant or think they might be pregnant. There is a potential for serious side effects to an unborn child. Talk to your health care professional or pharmacist for more information. Do not breast-feed an infant while taking this medicine. Men should   inform their doctors if they wish to father a child. This medicine may lower sperm counts. Talk to your health care professional or pharmacist for more information. What side effects may I notice from receiving this medicine? Side effects that you should  report to your doctor or health care professional as soon as possible: -allergic reactions like skin rash, itching or hives, swelling of the face, lips, or tongue -bone pain -breathing problems -changes in vision -chest pain -feeling faint or lightheaded, falls -fever, chills -pain, swelling, warmth in the leg -pain, tingling, numbness in the hands or feet -signs and symptoms of low blood pressure like dizziness; feeling faint or lightheaded, falls; unusually weak or tired -stomach pain -swelling of the ankles, feet, hands -trouble passing urine or change in the amount of urine -unusually high or low blood pressure -unusually weak or tired Side effects that usually do not require medical attention (report to your doctor or health care professional if they continue or are bothersome): -change in sex drive or performance -changes in breast size in both males and females -changes in emotions or moods -headache -hot flashes -irritation at site where injected -loss of appetite -skin problems like acne, dry skin -vaginal dryness This list may not describe all possible side effects. Call your doctor for medical advice about side effects. You may report side effects to FDA at 1-800-FDA-1088. Where should I keep my medicine? This drug is given in a hospital or clinic and will not be stored at home. NOTE: This sheet is a summary. It may not cover all possible information. If you have questions about this medicine, talk to your doctor, pharmacist, or health care provider.    2016, Elsevier/Gold Standard. (2013-05-14 11:10:35)  

## 2015-11-10 ENCOUNTER — Ambulatory Visit (INDEPENDENT_AMBULATORY_CARE_PROVIDER_SITE_OTHER): Payer: 59 | Admitting: Family

## 2015-11-10 ENCOUNTER — Encounter: Payer: Self-pay | Admitting: Family

## 2015-11-10 ENCOUNTER — Ambulatory Visit: Payer: 59

## 2015-11-10 ENCOUNTER — Telehealth: Payer: Self-pay | Admitting: Internal Medicine

## 2015-11-10 VITALS — BP 130/82 | HR 80 | Temp 98.0°F | Resp 16 | Wt 168.0 lb

## 2015-11-10 DIAGNOSIS — N3001 Acute cystitis with hematuria: Secondary | ICD-10-CM

## 2015-11-10 DIAGNOSIS — R350 Frequency of micturition: Secondary | ICD-10-CM

## 2015-11-10 LAB — POCT URINALYSIS DIPSTICK
Bilirubin, UA: NEGATIVE
Glucose, UA: NEGATIVE
Ketones, UA: NEGATIVE
NITRITE UA: NEGATIVE
PH UA: 6
PROTEIN UA: 100
Spec Grav, UA: 1.02
UROBILINOGEN UA: 0.2

## 2015-11-10 MED ORDER — SULFAMETHOXAZOLE-TRIMETHOPRIM 800-160 MG PO TABS: 1.0000 | ORAL_TABLET | Freq: Two times a day (BID) | ORAL | 0 refills | Status: DC

## 2015-11-10 MED FILL — SULFAMETHOXAZOLE/TMP DS TAB: 800-160 | 3 days supply | Qty: 6 | Fill #0

## 2015-11-10 NOTE — Patient Instructions (Signed)
Drink plenty of water and take antibiotic as prescribed.   We are pending the urine culture to know the organism causing the infection and our office will call you with results. If the particular organism requires a different antibiotic than the on prescribed, we will place an order for a new prescription at that time.   If you symptoms worsen or you have new symptoms, please contact our office, or return to clinic for re evaluation.  Urinary Tract Infection Urinary tract infections (UTIs) can develop anywhere along your urinary tract. Your urinary tract is your body's drainage system for removing wastes and extra water. Your urinary tract includes two kidneys, two ureters, a bladder, and a urethra. Your kidneys are a pair of bean-shaped organs. Each kidney is about the size of your fist. They are located below your ribs, one on each side of your spine. CAUSES Infections are caused by microbes, which are microscopic organisms, including fungi, viruses, and bacteria. These organisms are so small that they can only be seen through a microscope. Bacteria are the microbes that most commonly cause UTIs. SYMPTOMS  Symptoms of UTIs may vary by age and gender of the patient and by the location of the infection. Symptoms in young women typically include a frequent and intense urge to urinate and a painful, burning feeling in the bladder or urethra during urination. Older women and men are more likely to be tired, shaky, and weak and have muscle aches and abdominal pain. A fever may mean the infection is in your kidneys. Other symptoms of a kidney infection include pain in your back or sides below the ribs, nausea, and vomiting. DIAGNOSIS To diagnose a UTI, your caregiver will ask you about your symptoms. Your caregiver will also ask you to provide a urine sample. The urine sample will be tested for bacteria and white blood cells. White blood cells are made by your body to help fight infection. TREATMENT    Typically, UTIs can be treated with medication. Because most UTIs are caused by a bacterial infection, they usually can be treated with the use of antibiotics. The choice of antibiotic and length of treatment depend on your symptoms and the type of bacteria causing your infection. HOME CARE INSTRUCTIONS  If you were prescribed antibiotics, take them exactly as your caregiver instructs you. Finish the medication even if you feel better after you have only taken some of the medication.  Drink enough water and fluids to keep your urine clear or pale yellow.  Avoid caffeine, tea, and carbonated beverages. They tend to irritate your bladder.  Empty your bladder often. Avoid holding urine for long periods of time.  Empty your bladder before and after sexual intercourse.  After a bowel movement, women should cleanse from front to back. Use each tissue only once. SEEK MEDICAL CARE IF:   You have back pain.  You develop a fever.  Your symptoms do not begin to resolve within 3 days. SEEK IMMEDIATE MEDICAL CARE IF:   You have severe back pain or lower abdominal pain.  You develop chills.  You have nausea or vomiting.  You have continued burning or discomfort with urination. MAKE SURE YOU:   Understand these instructions.  Will watch your condition.  Will get help right away if you are not doing well or get worse.   This information is not intended to replace advice given to you by your health care provider. Make sure you discuss any questions you have with your health care   provider.   Document Released: 12/15/2004 Document Revised: 11/26/2014 Document Reviewed: 04/15/2011 Elsevier Interactive Patient Education 2016 [employer]    

## 2015-11-10 NOTE — Progress Notes (Signed)
**Note Mary-Identified via Obfuscation** Subjective:    Patient ID: Mary [name], female    DOB: [birth year], 55 y.o.   MRN: [id number]  CC: Mary [name] is a 55 y.o. female who presents today for an acute visit.    HPI: Patient here for evaluation of dysuria which started last night. Endorses slight low back pain, 'chilled'. No fever, hematuria.   Recent UTI. No concern for STDs. No h/o kidney stones.     HISTORY:  Past Medical History:  Diagnosis Date  . Anxiety    takes Xanax prn  . Breast cancer [facility]) August 2013   bilateral, er/pr+  . Complication of anesthesia    pt states she had the shakes extremely bad  . Cough    at night d/t reflux;nothing productive  . Dental crowns present    x 4  . GERD (gastroesophageal reflux disease)    takes Nexium daily  . History of bronchitis    08/2011  . History of colon polyps   . Hot flashes, menopausal 08/08/2012  . IBS (irritable bowel syndrome)    immodium prn  . Insomnia    takes Ambien nightly  . Irritable bowel syndrome   . Neutropenia, drug-induced ([place]) 12/15/2011  . Nocturia   . Plantar fasciitis   . S/P radiation therapy 07/03/2012 through 08/18/2038                                                        Right chest wall/regional lymph nodes 5040 cGy 28 sessions, right chest wall/mastectomy scar boost 1000 cGy 5 sessions                          . Status post chemotherapy    FEC 100 starting on 12/08/2011 - 01/18/12/single agent Taxol x12 weeks from 02/02/2012 through January 2014   . Urinary frequency   . Use of tamoxifen (Nolvadex)    Past Surgical History:  Procedure Laterality Date  . BREAST LUMPECTOMY  2008   left  . COLONOSCOPY    . MASTECTOMY WITH AXILLARY LYMPH NODE DISSECTION Right 05/18/2012   Procedure: MASTECTOMY WITH AXILLARY LYMPH NODE DISSECTION;  Surgeon: Adin Hector, MD;  Location: [place];  Service: General;  Laterality: Right;  Bilateral Total Mastectomy , right axillary lymph node dissection left axillary sentinel node  biopsy, removal of Porta Cath  . PORT-A-CATH REMOVAL N/A 05/18/2012   Procedure: REMOVAL PORT-A-CATH;  Surgeon: Adin Hector, MD;  Location: [place];  Service: General;  Laterality: N/A;  . PORTACATH PLACEMENT  12/02/2011   Procedure: INSERTION PORT-A-CATH;  Surgeon: Adin Hector, MD;  Location: [place];  Service: General;  Laterality: N/A;  insertion port a cath with fluoroscopy  . SIMPLE MASTECTOMY WITH AXILLARY SENTINEL NODE BIOPSY Left 05/18/2012   Procedure: SIMPLE MASTECTOMY WITH AXILLARY SENTINEL NODE BIOPSY;  Surgeon: Adin Hector, MD;  Location: [address];  Service: General;  Laterality: Left;   Family History  Problem Relation Age of Onset  . Hypertension Mother   . Hypertension Maternal Grandmother   . Pancreatic cancer Paternal Grandmother     diagnosed in her 48s  . Ovarian cancer Maternal Aunt     maternal grandmother's sister  . Breast cancer Paternal Aunt     diagnosed in late 21s  early 85s  . Parkinsonism Paternal Grandfather     Allergies: Adhesive [tape] Current Outpatient Prescriptions on File Prior to Visit  Medication Sig Dispense Refill  . ALPRAZolam (XANAX) 0.25 MG tablet TAKE ONE TABLET BY MOUTH TWICE DAILY AS NEEDED FOR ANXIETY 60 tablet 4  . amLODipine (NORVASC) 5 MG tablet TAKE 1 TABLET BY MOUTH DAILY. 30 tablet 0  . Ascorbic Acid (VITAMIN C) 100 MG tablet Take 100 mg by mouth daily.      . Calcium Carbonate-Vitamin D (CALTRATE 600+D PO) Take 1 tablet by mouth daily.    . cholecalciferol (VITAMIN D) 400 UNITS TABS Take 1,000 Units by mouth daily.     Marland Kitchen esomeprazole (NEXIUM) 40 MG capsule Take 40 mg by mouth daily before breakfast.    . exemestane (AROMASIN) 25 MG tablet TAKE 1 TABLET BY MOUTH ONCE DAILY AFTER BREAKFAST 90 tablet 3  . goserelin (ZOLADEX) 3.6 MG injection Inject 3.6 mg into the skin every 28 (twenty-eight) days.    Marland Kitchen loperamide (IMODIUM A-D) 2 MG tablet Take 2 mg by mouth 4 (four) times daily as needed for diarrhea or  loose stools.     . Multiple Vitamin (MULTIVITAMIN) tablet Take 1 tablet by mouth daily.       No current facility-administered medications on file prior to visit.     Social History  Substance Use Topics  . Smoking status: Former Smoker    Packs/day: 0.50    Quit date: 03/21/1997  . Smokeless tobacco: Never Used  . Alcohol use 0.0 oz/week     Comment: occasionally wine    Review of Systems  Constitutional: Negative for chills and fever.  Respiratory: Negative for cough.   Cardiovascular: Negative for chest pain and palpitations.  Gastrointestinal: Negative for nausea and vomiting.  Genitourinary: Positive for dysuria.      Objective:    BP 130/82 (BP Location: Left Arm, Patient Position: Sitting, Cuff Size: Large)   Pulse 80   Temp 98 F (36.7 C) (Oral)   Resp 16   Wt 168 lb (76.2 kg)   SpO2 98%   BMI 28.84 kg/m    Physical Exam  Constitutional: She appears well-developed and well-nourished.  Cardiovascular: Normal rate, regular rhythm, normal heart sounds and normal pulses.   Pulmonary/Chest: Effort normal and breath sounds normal. She has no wheezes. She has no rhonchi. She has no rales.  Abdominal: There is no CVA tenderness.  Neurological: She is alert.  Skin: Skin is warm and dry.  Psychiatric: She has a normal mood and affect. Her speech is normal and behavior is normal. Thought content normal.  Vitals reviewed.      Assessment & Plan:  1. Urinary frequency/ Acute cystitis with hematuria Urinalysis in office shows leukocytes and moderate blood. Will treat empirically for UTI. Pending urine culture at this time.  - POCT urinalysis dipstick - CULTURE, URINE COMPREHENSIVE  - sulfamethoxazole-trimethoprim (BACTRIM DS,SEPTRA DS) 800-160 MG tablet; Take 1 tablet by mouth 2 (two) times daily.  Dispense: 6 tablet; Refill: 0     I am having [name] maintain her vitamin C, multivitamin, cholecalciferol, loperamide, Calcium Carbonate-Vitamin D (CALTRATE 600+D  PO), esomeprazole, ALPRAZolam, goserelin, exemestane, amLODipine, and sulfamethoxazole-trimethoprim.   Meds ordered this encounter  Medications  . DISCONTD: sulfamethoxazole-trimethoprim (BACTRIM DS,SEPTRA DS) 800-160 MG tablet    Sig: Take 1 tablet by mouth 2 (two) times daily.    Dispense:  6 tablet    Refill:  0    Order Specific Question:  Supervising Provider    Answer:   [name] [[id number]]  . sulfamethoxazole-trimethoprim (BACTRIM DS,SEPTRA DS) 800-160 MG tablet    Sig: Take 1 tablet by mouth 2 (two) times daily.    Dispense:  6 tablet    Refill:  0    Order Specific Question:   Supervising Provider    Answer:   [name] [[id number]]    Return precautions given.   Risks, benefits, and alternatives of the medications and treatment plan prescribed today were discussed, and patient expressed understanding.   Education regarding symptom management and diagnosis given to patient on AVS.  Continue to follow with TULLO, Aris Everts, MD for routine health maintenance.   Mary [name] and I agreed with plan.   Mable Paris, FNP

## 2015-11-13 LAB — CULTURE, URINE COMPREHENSIVE: Colony Count: >=100000

## 2015-11-16 ENCOUNTER — Ambulatory Visit (HOSPITAL_BASED_OUTPATIENT_CLINIC_OR_DEPARTMENT_OTHER): Payer: 59

## 2015-11-16 VITALS — BP 150/77 | HR 72 | Temp 97.7°F | Resp 18

## 2015-11-16 DIAGNOSIS — Z5111 Encounter for antineoplastic chemotherapy: Secondary | ICD-10-CM | POA: Diagnosis not present

## 2015-11-16 DIAGNOSIS — C779 Secondary and unspecified malignant neoplasm of lymph node, unspecified: Secondary | ICD-10-CM

## 2015-11-16 DIAGNOSIS — C50412 Malignant neoplasm of upper-outer quadrant of left female breast: Secondary | ICD-10-CM

## 2015-11-16 MED ORDER — GOSERELIN ACETATE 3.6 MG ~~LOC~~ IMPL
3.6000 mg | DRUG_IMPLANT | Freq: Once | SUBCUTANEOUS | Status: AC
  Administered 2015-11-16: 3.6 mg via SUBCUTANEOUS
  Filled 2015-11-16: qty 3.6

## 2015-11-16 NOTE — Patient Instructions (Signed)
Goserelin injection What is this medicine? GOSERELIN (GOE se rel in) is similar to a hormone found in the body. It lowers the amount of sex hormones that the body makes. Men will have lower testosterone levels and women will have lower estrogen levels while taking this medicine. In men, this medicine is used to treat prostate cancer; the injection is either given once per month or once every 12 weeks. A once per month injection (only) is used to treat women with endometriosis, dysfunctional uterine bleeding, or advanced breast cancer. This medicine may be used for other purposes; ask your health care provider or pharmacist if you have questions. What should I tell my health care provider before I take this medicine? They need to know if you have any of these conditions (some only apply to women): -diabetes -heart disease or previous heart attack -high blood pressure -high cholesterol -kidney disease -osteoporosis or low bone density -problems passing urine -spinal cord injury -stroke -tobacco smoker -an unusual or allergic reaction to goserelin, hormone therapy, other medicines, foods, dyes, or preservatives -pregnant or trying to get pregnant -breast-feeding How should I use this medicine? This medicine is for injection under the skin. It is given by a health care professional in a hospital or clinic setting. Men receive this injection once every 4 weeks or once every 12 weeks. Women will only receive the once every 4 weeks injection. Talk to your pediatrician regarding the use of this medicine in children. Special care may be needed. Overdosage: If you think you have taken too much of this medicine contact a poison control center or emergency room at once. NOTE: This medicine is only for you. Do not share this medicine with others. What if I miss a dose? It is important not to miss your dose. Call your doctor or health care professional if you are unable to keep an appointment. What may  interact with this medicine? -female hormones like estrogen -herbal or dietary supplements like black cohosh, chasteberry, or DHEA -female hormones like testosterone -prasterone This list may not describe all possible interactions. Give your health care provider a list of all the medicines, herbs, non-prescription drugs, or dietary supplements you use. Also tell them if you smoke, drink alcohol, or use illegal drugs. Some items may interact with your medicine. What should I watch for while using this medicine? Visit your doctor or health care professional for regular checks on your progress. Your symptoms may appear to get worse during the first weeks of this therapy. Tell your doctor or healthcare professional if your symptoms do not start to get better or if they get worse after this time. Your bones may get weaker if you take this medicine for a long time. If you smoke or frequently drink alcohol you may increase your risk of bone loss. A family history of osteoporosis, chronic use of drugs for seizures (convulsions), or corticosteroids can also increase your risk of bone loss. Talk to your doctor about how to keep your bones strong. This medicine should stop regular monthly menstration in women. Tell your doctor if you continue to menstrate. Women should not become pregnant while taking this medicine or for 12 weeks after stopping this medicine. Women should inform their doctor if they wish to become pregnant or think they might be pregnant. There is a potential for serious side effects to an unborn child. Talk to your health care professional or pharmacist for more information. Do not breast-feed an infant while taking this medicine. Men should   inform their doctors if they wish to father a child. This medicine may lower sperm counts. Talk to your health care professional or pharmacist for more information. What side effects may I notice from receiving this medicine? Side effects that you should  report to your doctor or health care professional as soon as possible: -allergic reactions like skin rash, itching or hives, swelling of the face, lips, or tongue -bone pain -breathing problems -changes in vision -chest pain -feeling faint or lightheaded, falls -fever, chills -pain, swelling, warmth in the leg -pain, tingling, numbness in the hands or feet -signs and symptoms of low blood pressure like dizziness; feeling faint or lightheaded, falls; unusually weak or tired -stomach pain -swelling of the ankles, feet, hands -trouble passing urine or change in the amount of urine -unusually high or low blood pressure -unusually weak or tired Side effects that usually do not require medical attention (report to your doctor or health care professional if they continue or are bothersome): -change in sex drive or performance -changes in breast size in both males and females -changes in emotions or moods -headache -hot flashes -irritation at site where injected -loss of appetite -skin problems like acne, dry skin -vaginal dryness This list may not describe all possible side effects. Call your doctor for medical advice about side effects. You may report side effects to FDA at 1-800-FDA-1088. Where should I keep my medicine? This drug is given in a hospital or clinic and will not be stored at home. NOTE: This sheet is a summary. It may not cover all possible information. If you have questions about this medicine, talk to your doctor, pharmacist, or health care provider.    2016, Elsevier/Gold Standard. (2013-05-14 11:10:35)  

## 2015-11-24 ENCOUNTER — Other Ambulatory Visit: Payer: Self-pay | Admitting: Internal Medicine

## 2015-11-24 MED FILL — AMLODIPINE BESYLATE 5 MG TA: 5 | 30 days supply | Qty: 30 | Fill #0

## 2015-12-08 ENCOUNTER — Ambulatory Visit: Payer: 59

## 2015-12-14 ENCOUNTER — Ambulatory Visit: Payer: 59

## 2015-12-16 ENCOUNTER — Telehealth: Payer: Self-pay | Admitting: Hematology and Oncology

## 2015-12-16 ENCOUNTER — Ambulatory Visit (HOSPITAL_BASED_OUTPATIENT_CLINIC_OR_DEPARTMENT_OTHER): Payer: 59

## 2015-12-16 VITALS — BP 145/82 | HR 77 | Temp 98.2°F | Resp 20

## 2015-12-16 DIAGNOSIS — Z5111 Encounter for antineoplastic chemotherapy: Secondary | ICD-10-CM | POA: Diagnosis not present

## 2015-12-16 DIAGNOSIS — C50412 Malignant neoplasm of upper-outer quadrant of left female breast: Secondary | ICD-10-CM | POA: Diagnosis not present

## 2015-12-16 MED ORDER — GOSERELIN ACETATE 3.6 MG ~~LOC~~ IMPL
3.6000 mg | DRUG_IMPLANT | Freq: Once | SUBCUTANEOUS | Status: AC
  Administered 2015-12-16: 3.6 mg via SUBCUTANEOUS
  Filled 2015-12-16: qty 3.6

## 2015-12-16 NOTE — Telephone Encounter (Signed)
Patient called to reschedule appointment for injection that was missed on 09/25.

## 2015-12-16 NOTE — Patient Instructions (Signed)
Goserelin injection What is this medicine? GOSERELIN (GOE se rel in) is similar to a hormone found in the body. It lowers the amount of sex hormones that the body makes. Men will have lower testosterone levels and women will have lower estrogen levels while taking this medicine. In men, this medicine is used to treat prostate cancer; the injection is either given once per month or once every 12 weeks. A once per month injection (only) is used to treat women with endometriosis, dysfunctional uterine bleeding, or advanced breast cancer. This medicine may be used for other purposes; ask your health care provider or pharmacist if you have questions. What should I tell my health care provider before I take this medicine? They need to know if you have any of these conditions (some only apply to women): -diabetes -heart disease or previous heart attack -high blood pressure -high cholesterol -kidney disease -osteoporosis or low bone density -problems passing urine -spinal cord injury -stroke -tobacco smoker -an unusual or allergic reaction to goserelin, hormone therapy, other medicines, foods, dyes, or preservatives -pregnant or trying to get pregnant -breast-feeding How should I use this medicine? This medicine is for injection under the skin. It is given by a health care professional in a hospital or clinic setting. Men receive this injection once every 4 weeks or once every 12 weeks. Women will only receive the once every 4 weeks injection. Talk to your pediatrician regarding the use of this medicine in children. Special care may be needed. Overdosage: If you think you have taken too much of this medicine contact a poison control center or emergency room at once. NOTE: This medicine is only for you. Do not share this medicine with others. What if I miss a dose? It is important not to miss your dose. Call your doctor or health care professional if you are unable to keep an appointment. What may  interact with this medicine? -female hormones like estrogen -herbal or dietary supplements like black cohosh, chasteberry, or DHEA -female hormones like testosterone -prasterone This list may not describe all possible interactions. Give your health care provider a list of all the medicines, herbs, non-prescription drugs, or dietary supplements you use. Also tell them if you smoke, drink alcohol, or use illegal drugs. Some items may interact with your medicine. What should I watch for while using this medicine? Visit your doctor or health care professional for regular checks on your progress. Your symptoms may appear to get worse during the first weeks of this therapy. Tell your doctor or healthcare professional if your symptoms do not start to get better or if they get worse after this time. Your bones may get weaker if you take this medicine for a long time. If you smoke or frequently drink alcohol you may increase your risk of bone loss. A family history of osteoporosis, chronic use of drugs for seizures (convulsions), or corticosteroids can also increase your risk of bone loss. Talk to your doctor about how to keep your bones strong. This medicine should stop regular monthly menstration in women. Tell your doctor if you continue to menstrate. Women should not become pregnant while taking this medicine or for 12 weeks after stopping this medicine. Women should inform their doctor if they wish to become pregnant or think they might be pregnant. There is a potential for serious side effects to an unborn child. Talk to your health care professional or pharmacist for more information. Do not breast-feed an infant while taking this medicine. Men should   inform their doctors if they wish to father a child. This medicine may lower sperm counts. Talk to your health care professional or pharmacist for more information. What side effects may I notice from receiving this medicine? Side effects that you should  report to your doctor or health care professional as soon as possible: -allergic reactions like skin rash, itching or hives, swelling of the face, lips, or tongue -bone pain -breathing problems -changes in vision -chest pain -feeling faint or lightheaded, falls -fever, chills -pain, swelling, warmth in the leg -pain, tingling, numbness in the hands or feet -signs and symptoms of low blood pressure like dizziness; feeling faint or lightheaded, falls; unusually weak or tired -stomach pain -swelling of the ankles, feet, hands -trouble passing urine or change in the amount of urine -unusually high or low blood pressure -unusually weak or tired Side effects that usually do not require medical attention (report to your doctor or health care professional if they continue or are bothersome): -change in sex drive or performance -changes in breast size in both males and females -changes in emotions or moods -headache -hot flashes -irritation at site where injected -loss of appetite -skin problems like acne, dry skin -vaginal dryness This list may not describe all possible side effects. Call your doctor for medical advice about side effects. You may report side effects to FDA at 1-800-FDA-1088. Where should I keep my medicine? This drug is given in a hospital or clinic and will not be stored at home. NOTE: This sheet is a summary. It may not cover all possible information. If you have questions about this medicine, talk to your doctor, pharmacist, or health care provider.    2016, Elsevier/Gold Standard. (2013-05-14 11:10:35)  

## 2015-12-28 ENCOUNTER — Other Ambulatory Visit: Payer: Self-pay | Admitting: Internal Medicine

## 2015-12-28 MED FILL — AMLODIPINE BESYLATE 5 MG TA: 5 | 30 days supply | Qty: 30 | Fill #0

## 2015-12-28 MED FILL — EXEMESTANE 25 MG TABLET: 25 | 90 days supply | Qty: 90 | Fill #1

## 2015-12-28 NOTE — Telephone Encounter (Signed)
Ok to refill.  Patient getting cancer treatment infusions at [facility].

## 2015-12-28 NOTE — Telephone Encounter (Signed)
Patient was seen for a acute visit only, please advise refill.  Last OV with you was 09/19/2014. Refilled last for 30 days only and needed appt.

## 2016-01-05 ENCOUNTER — Ambulatory Visit: Payer: 59

## 2016-01-11 ENCOUNTER — Telehealth: Payer: Self-pay | Admitting: Hematology and Oncology

## 2016-01-11 ENCOUNTER — Ambulatory Visit: Payer: 59

## 2016-01-11 NOTE — Telephone Encounter (Signed)
Returned call  To patient to reschedule 10/23 appointment. Rescheduled to 10/25.

## 2016-01-13 ENCOUNTER — Ambulatory Visit (HOSPITAL_BASED_OUTPATIENT_CLINIC_OR_DEPARTMENT_OTHER): Payer: 59

## 2016-01-13 VITALS — BP 135/79 | HR 78 | Temp 98.4°F | Resp 18

## 2016-01-13 DIAGNOSIS — C50412 Malignant neoplasm of upper-outer quadrant of left female breast: Secondary | ICD-10-CM

## 2016-01-13 DIAGNOSIS — Z5111 Encounter for antineoplastic chemotherapy: Secondary | ICD-10-CM | POA: Diagnosis not present

## 2016-01-13 MED ORDER — GOSERELIN ACETATE 3.6 MG ~~LOC~~ IMPL
3.6000 mg | DRUG_IMPLANT | Freq: Once | SUBCUTANEOUS | Status: AC
  Administered 2016-01-13: 3.6 mg via SUBCUTANEOUS
  Filled 2016-01-13: qty 3.6

## 2016-01-13 NOTE — Patient Instructions (Signed)
Goserelin injection What is this medicine? GOSERELIN (GOE se rel in) is similar to a hormone found in the body. It lowers the amount of sex hormones that the body makes. Men will have lower testosterone levels and women will have lower estrogen levels while taking this medicine. In men, this medicine is used to treat prostate cancer; the injection is either given once per month or once every 12 weeks. A once per month injection (only) is used to treat women with endometriosis, dysfunctional uterine bleeding, or advanced breast cancer. This medicine may be used for other purposes; ask your health care provider or pharmacist if you have questions. What should I tell my health care provider before I take this medicine? They need to know if you have any of these conditions (some only apply to women): -diabetes -heart disease or previous heart attack -high blood pressure -high cholesterol -kidney disease -osteoporosis or low bone density -problems passing urine -spinal cord injury -stroke -tobacco smoker -an unusual or allergic reaction to goserelin, hormone therapy, other medicines, foods, dyes, or preservatives -pregnant or trying to get pregnant -breast-feeding How should I use this medicine? This medicine is for injection under the skin. It is given by a health care professional in a hospital or clinic setting. Men receive this injection once every 4 weeks or once every 12 weeks. Women will only receive the once every 4 weeks injection. Talk to your pediatrician regarding the use of this medicine in children. Special care may be needed. Overdosage: If you think you have taken too much of this medicine contact a poison control center or emergency room at once. NOTE: This medicine is only for you. Do not share this medicine with others. What if I miss a dose? It is important not to miss your dose. Call your doctor or health care professional if you are unable to keep an appointment. What may  interact with this medicine? -female hormones like estrogen -herbal or dietary supplements like black cohosh, chasteberry, or DHEA -female hormones like testosterone -prasterone This list may not describe all possible interactions. Give your health care provider a list of all the medicines, herbs, non-prescription drugs, or dietary supplements you use. Also tell them if you smoke, drink alcohol, or use illegal drugs. Some items may interact with your medicine. What should I watch for while using this medicine? Visit your doctor or health care professional for regular checks on your progress. Your symptoms may appear to get worse during the first weeks of this therapy. Tell your doctor or healthcare professional if your symptoms do not start to get better or if they get worse after this time. Your bones may get weaker if you take this medicine for a long time. If you smoke or frequently drink alcohol you may increase your risk of bone loss. A family history of osteoporosis, chronic use of drugs for seizures (convulsions), or corticosteroids can also increase your risk of bone loss. Talk to your doctor about how to keep your bones strong. This medicine should stop regular monthly menstration in women. Tell your doctor if you continue to menstrate. Women should not become pregnant while taking this medicine or for 12 weeks after stopping this medicine. Women should inform their doctor if they wish to become pregnant or think they might be pregnant. There is a potential for serious side effects to an unborn child. Talk to your health care professional or pharmacist for more information. Do not breast-feed an infant while taking this medicine. Men should   inform their doctors if they wish to father a child. This medicine may lower sperm counts. Talk to your health care professional or pharmacist for more information. What side effects may I notice from receiving this medicine? Side effects that you should  report to your doctor or health care professional as soon as possible: -allergic reactions like skin rash, itching or hives, swelling of the face, lips, or tongue -bone pain -breathing problems -changes in vision -chest pain -feeling faint or lightheaded, falls -fever, chills -pain, swelling, warmth in the leg -pain, tingling, numbness in the hands or feet -signs and symptoms of low blood pressure like dizziness; feeling faint or lightheaded, falls; unusually weak or tired -stomach pain -swelling of the ankles, feet, hands -trouble passing urine or change in the amount of urine -unusually high or low blood pressure -unusually weak or tired Side effects that usually do not require medical attention (report to your doctor or health care professional if they continue or are bothersome): -change in sex drive or performance -changes in breast size in both males and females -changes in emotions or moods -headache -hot flashes -irritation at site where injected -loss of appetite -skin problems like acne, dry skin -vaginal dryness This list may not describe all possible side effects. Call your doctor for medical advice about side effects. You may report side effects to FDA at 1-800-FDA-1088. Where should I keep my medicine? This drug is given in a hospital or clinic and will not be stored at home. NOTE: This sheet is a summary. It may not cover all possible information. If you have questions about this medicine, talk to your doctor, pharmacist, or health care provider.    2016, Elsevier/Gold Standard. (2013-05-14 11:10:35)  

## 2016-02-01 MED FILL — AMLODIPINE BESYLATE 5 MG TA: 5 | 30 days supply | Qty: 30 | Fill #1

## 2016-02-02 ENCOUNTER — Ambulatory Visit: Payer: 59

## 2016-02-08 ENCOUNTER — Ambulatory Visit (HOSPITAL_BASED_OUTPATIENT_CLINIC_OR_DEPARTMENT_OTHER): Payer: 59

## 2016-02-08 VITALS — BP 143/76 | HR 76 | Temp 98.2°F | Resp 20

## 2016-02-08 DIAGNOSIS — C50412 Malignant neoplasm of upper-outer quadrant of left female breast: Secondary | ICD-10-CM | POA: Diagnosis not present

## 2016-02-08 DIAGNOSIS — Z5111 Encounter for antineoplastic chemotherapy: Secondary | ICD-10-CM

## 2016-02-08 MED ORDER — GOSERELIN ACETATE 3.6 MG ~~LOC~~ IMPL
3.6000 mg | DRUG_IMPLANT | Freq: Once | SUBCUTANEOUS | Status: AC
  Administered 2016-02-08: 3.6 mg via SUBCUTANEOUS
  Filled 2016-02-08: qty 3.6

## 2016-02-08 NOTE — Patient Instructions (Signed)
Goserelin injection What is this medicine? GOSERELIN (GOE se rel in) is similar to a hormone found in the body. It lowers the amount of sex hormones that the body makes. Men will have lower testosterone levels and women will have lower estrogen levels while taking this medicine. In men, this medicine is used to treat prostate cancer; the injection is either given once per month or once every 12 weeks. A once per month injection (only) is used to treat women with endometriosis, dysfunctional uterine bleeding, or advanced breast cancer. This medicine may be used for other purposes; ask your health care provider or pharmacist if you have questions. What should I tell my health care provider before I take this medicine? They need to know if you have any of these conditions (some only apply to women): -diabetes -heart disease or previous heart attack -high blood pressure -high cholesterol -kidney disease -osteoporosis or low bone density -problems passing urine -spinal cord injury -stroke -tobacco smoker -an unusual or allergic reaction to goserelin, hormone therapy, other medicines, foods, dyes, or preservatives -pregnant or trying to get pregnant -breast-feeding How should I use this medicine? This medicine is for injection under the skin. It is given by a health care professional in a hospital or clinic setting. Men receive this injection once every 4 weeks or once every 12 weeks. Women will only receive the once every 4 weeks injection. Talk to your pediatrician regarding the use of this medicine in children. Special care may be needed. Overdosage: If you think you have taken too much of this medicine contact a poison control center or emergency room at once. NOTE: This medicine is only for you. Do not share this medicine with others. What if I miss a dose? It is important not to miss your dose. Call your doctor or health care professional if you are unable to keep an appointment. What may  interact with this medicine? -female hormones like estrogen -herbal or dietary supplements like black cohosh, chasteberry, or DHEA -female hormones like testosterone -prasterone This list may not describe all possible interactions. Give your health care provider a list of all the medicines, herbs, non-prescription drugs, or dietary supplements you use. Also tell them if you smoke, drink alcohol, or use illegal drugs. Some items may interact with your medicine. What should I watch for while using this medicine? Visit your doctor or health care professional for regular checks on your progress. Your symptoms may appear to get worse during the first weeks of this therapy. Tell your doctor or healthcare professional if your symptoms do not start to get better or if they get worse after this time. Your bones may get weaker if you take this medicine for a long time. If you smoke or frequently drink alcohol you may increase your risk of bone loss. A family history of osteoporosis, chronic use of drugs for seizures (convulsions), or corticosteroids can also increase your risk of bone loss. Talk to your doctor about how to keep your bones strong. This medicine should stop regular monthly menstration in women. Tell your doctor if you continue to menstrate. Women should not become pregnant while taking this medicine or for 12 weeks after stopping this medicine. Women should inform their doctor if they wish to become pregnant or think they might be pregnant. There is a potential for serious side effects to an unborn child. Talk to your health care professional or pharmacist for more information. Do not breast-feed an infant while taking this medicine. Men should   inform their doctors if they wish to father a child. This medicine may lower sperm counts. Talk to your health care professional or pharmacist for more information. What side effects may I notice from receiving this medicine? Side effects that you should  report to your doctor or health care professional as soon as possible: -allergic reactions like skin rash, itching or hives, swelling of the face, lips, or tongue -bone pain -breathing problems -changes in vision -chest pain -feeling faint or lightheaded, falls -fever, chills -pain, swelling, warmth in the leg -pain, tingling, numbness in the hands or feet -signs and symptoms of low blood pressure like dizziness; feeling faint or lightheaded, falls; unusually weak or tired -stomach pain -swelling of the ankles, feet, hands -trouble passing urine or change in the amount of urine -unusually high or low blood pressure -unusually weak or tired Side effects that usually do not require medical attention (report to your doctor or health care professional if they continue or are bothersome): -change in sex drive or performance -changes in breast size in both males and females -changes in emotions or moods -headache -hot flashes -irritation at site where injected -loss of appetite -skin problems like acne, dry skin -vaginal dryness This list may not describe all possible side effects. Call your doctor for medical advice about side effects. You may report side effects to FDA at 1-800-FDA-1088. Where should I keep my medicine? This drug is given in a hospital or clinic and will not be stored at home. NOTE: This sheet is a summary. It may not cover all possible information. If you have questions about this medicine, talk to your doctor, pharmacist, or health care provider.    2016, Elsevier/Gold Standard. (2013-05-14 11:10:35)  

## 2016-03-01 ENCOUNTER — Ambulatory Visit: Payer: 59

## 2016-03-03 MED FILL — AMLODIPINE BESYLATE 5 MG TA: 5 | 90 days supply | Qty: 90 | Fill #2

## 2016-03-07 ENCOUNTER — Ambulatory Visit (HOSPITAL_BASED_OUTPATIENT_CLINIC_OR_DEPARTMENT_OTHER): Payer: 59

## 2016-03-07 VITALS — BP 150/85 | HR 83 | Temp 97.9°F | Resp 18

## 2016-03-07 DIAGNOSIS — C779 Secondary and unspecified malignant neoplasm of lymph node, unspecified: Secondary | ICD-10-CM

## 2016-03-07 DIAGNOSIS — C50412 Malignant neoplasm of upper-outer quadrant of left female breast: Secondary | ICD-10-CM | POA: Diagnosis not present

## 2016-03-07 DIAGNOSIS — Z5111 Encounter for antineoplastic chemotherapy: Secondary | ICD-10-CM | POA: Diagnosis not present

## 2016-03-07 MED ORDER — GOSERELIN ACETATE 3.6 MG ~~LOC~~ IMPL
3.6000 mg | DRUG_IMPLANT | Freq: Once | SUBCUTANEOUS | Status: AC
  Administered 2016-03-07: 3.6 mg via SUBCUTANEOUS
  Filled 2016-03-07: qty 3.6

## 2016-03-07 NOTE — Patient Instructions (Signed)
Goserelin injection What is this medicine? GOSERELIN (GOE se rel in) is similar to a hormone found in the body. It lowers the amount of sex hormones that the body makes. Men will have lower testosterone levels and women will have lower estrogen levels while taking this medicine. In men, this medicine is used to treat prostate cancer; the injection is either given once per month or once every 12 weeks. A once per month injection (only) is used to treat women with endometriosis, dysfunctional uterine bleeding, or advanced breast cancer. This medicine may be used for other purposes; ask your health care provider or pharmacist if you have questions. What should I tell my health care provider before I take this medicine? They need to know if you have any of these conditions (some only apply to women): -diabetes -heart disease or previous heart attack -high blood pressure -high cholesterol -kidney disease -osteoporosis or low bone density -problems passing urine -spinal cord injury -stroke -tobacco smoker -an unusual or allergic reaction to goserelin, hormone therapy, other medicines, foods, dyes, or preservatives -pregnant or trying to get pregnant -breast-feeding How should I use this medicine? This medicine is for injection under the skin. It is given by a health care professional in a hospital or clinic setting. Men receive this injection once every 4 weeks or once every 12 weeks. Women will only receive the once every 4 weeks injection. Talk to your pediatrician regarding the use of this medicine in children. Special care may be needed. Overdosage: If you think you have taken too much of this medicine contact a poison control center or emergency room at once. NOTE: This medicine is only for you. Do not share this medicine with others. What if I miss a dose? It is important not to miss your dose. Call your doctor or health care professional if you are unable to keep an appointment. What may  interact with this medicine? -female hormones like estrogen -herbal or dietary supplements like black cohosh, chasteberry, or DHEA -female hormones like testosterone -prasterone This list may not describe all possible interactions. Give your health care provider a list of all the medicines, herbs, non-prescription drugs, or dietary supplements you use. Also tell them if you smoke, drink alcohol, or use illegal drugs. Some items may interact with your medicine. What should I watch for while using this medicine? Visit your doctor or health care professional for regular checks on your progress. Your symptoms may appear to get worse during the first weeks of this therapy. Tell your doctor or healthcare professional if your symptoms do not start to get better or if they get worse after this time. Your bones may get weaker if you take this medicine for a long time. If you smoke or frequently drink alcohol you may increase your risk of bone loss. A family history of osteoporosis, chronic use of drugs for seizures (convulsions), or corticosteroids can also increase your risk of bone loss. Talk to your doctor about how to keep your bones strong. This medicine should stop regular monthly menstration in women. Tell your doctor if you continue to menstrate. Women should not become pregnant while taking this medicine or for 12 weeks after stopping this medicine. Women should inform their doctor if they wish to become pregnant or think they might be pregnant. There is a potential for serious side effects to an unborn child. Talk to your health care professional or pharmacist for more information. Do not breast-feed an infant while taking this medicine. Men should   inform their doctors if they wish to father a child. This medicine may lower sperm counts. Talk to your health care professional or pharmacist for more information. What side effects may I notice from receiving this medicine? Side effects that you should  report to your doctor or health care professional as soon as possible: -allergic reactions like skin rash, itching or hives, swelling of the face, lips, or tongue -bone pain -breathing problems -changes in vision -chest pain -feeling faint or lightheaded, falls -fever, chills -pain, swelling, warmth in the leg -pain, tingling, numbness in the hands or feet -signs and symptoms of low blood pressure like dizziness; feeling faint or lightheaded, falls; unusually weak or tired -stomach pain -swelling of the ankles, feet, hands -trouble passing urine or change in the amount of urine -unusually high or low blood pressure -unusually weak or tired Side effects that usually do not require medical attention (report to your doctor or health care professional if they continue or are bothersome): -change in sex drive or performance -changes in breast size in both males and females -changes in emotions or moods -headache -hot flashes -irritation at site where injected -loss of appetite -skin problems like acne, dry skin -vaginal dryness This list may not describe all possible side effects. Call your doctor for medical advice about side effects. You may report side effects to FDA at 1-800-FDA-1088. Where should I keep my medicine? This drug is given in a hospital or clinic and will not be stored at home. NOTE: This sheet is a summary. It may not cover all possible information. If you have questions about this medicine, talk to your doctor, pharmacist, or health care provider.    2016, Elsevier/Gold Standard. (2013-05-14 11:10:35)  

## 2016-03-18 ENCOUNTER — Other Ambulatory Visit: Payer: Self-pay | Admitting: *Deleted

## 2016-03-18 ENCOUNTER — Telehealth: Payer: Self-pay | Admitting: *Deleted

## 2016-03-18 NOTE — Telephone Encounter (Signed)
Called pt to inform her that appts for her Zoladex injections have been added to her schedule beginning January,2018. Pt verbalized understanding. No further concerns.Message to be fwd to V.Gudena,MD.

## 2016-04-04 ENCOUNTER — Ambulatory Visit (HOSPITAL_BASED_OUTPATIENT_CLINIC_OR_DEPARTMENT_OTHER): Payer: 59

## 2016-04-04 VITALS — BP 143/86 | HR 74 | Temp 97.8°F | Resp 20

## 2016-04-04 DIAGNOSIS — Z5111 Encounter for antineoplastic chemotherapy: Secondary | ICD-10-CM

## 2016-04-04 DIAGNOSIS — C50412 Malignant neoplasm of upper-outer quadrant of left female breast: Secondary | ICD-10-CM

## 2016-04-04 DIAGNOSIS — C779 Secondary and unspecified malignant neoplasm of lymph node, unspecified: Secondary | ICD-10-CM | POA: Diagnosis not present

## 2016-04-04 MED ORDER — GOSERELIN ACETATE 3.6 MG ~~LOC~~ IMPL
3.6000 mg | DRUG_IMPLANT | Freq: Once | SUBCUTANEOUS | Status: AC
  Administered 2016-04-04: 3.6 mg via SUBCUTANEOUS
  Filled 2016-04-04: qty 3.6

## 2016-04-04 NOTE — Patient Instructions (Signed)
Goserelin injection What is this medicine? GOSERELIN (GOE se rel in) is similar to a hormone found in the body. It lowers the amount of sex hormones that the body makes. Men will have lower testosterone levels and women will have lower estrogen levels while taking this medicine. In men, this medicine is used to treat prostate cancer; the injection is either given once per month or once every 12 weeks. A once per month injection (only) is used to treat women with endometriosis, dysfunctional uterine bleeding, or advanced breast cancer. This medicine may be used for other purposes; ask your health care provider or pharmacist if you have questions. What should I tell my health care provider before I take this medicine? They need to know if you have any of these conditions (some only apply to women): -diabetes -heart disease or previous heart attack -high blood pressure -high cholesterol -kidney disease -osteoporosis or low bone density -problems passing urine -spinal cord injury -stroke -tobacco smoker -an unusual or allergic reaction to goserelin, hormone therapy, other medicines, foods, dyes, or preservatives -pregnant or trying to get pregnant -breast-feeding How should I use this medicine? This medicine is for injection under the skin. It is given by a health care professional in a hospital or clinic setting. Men receive this injection once every 4 weeks or once every 12 weeks. Women will only receive the once every 4 weeks injection. Talk to your pediatrician regarding the use of this medicine in children. Special care may be needed. Overdosage: If you think you have taken too much of this medicine contact a poison control center or emergency room at once. NOTE: This medicine is only for you. Do not share this medicine with others. What if I miss a dose? It is important not to miss your dose. Call your doctor or health care professional if you are unable to keep an appointment. What may  interact with this medicine? -female hormones like estrogen -herbal or dietary supplements like black cohosh, chasteberry, or DHEA -female hormones like testosterone -prasterone This list may not describe all possible interactions. Give your health care provider a list of all the medicines, herbs, non-prescription drugs, or dietary supplements you use. Also tell them if you smoke, drink alcohol, or use illegal drugs. Some items may interact with your medicine. What should I watch for while using this medicine? Visit your doctor or health care professional for regular checks on your progress. Your symptoms may appear to get worse during the first weeks of this therapy. Tell your doctor or healthcare professional if your symptoms do not start to get better or if they get worse after this time. Your bones may get weaker if you take this medicine for a long time. If you smoke or frequently drink alcohol you may increase your risk of bone loss. A family history of osteoporosis, chronic use of drugs for seizures (convulsions), or corticosteroids can also increase your risk of bone loss. Talk to your doctor about how to keep your bones strong. This medicine should stop regular monthly menstration in women. Tell your doctor if you continue to menstrate. Women should not become pregnant while taking this medicine or for 12 weeks after stopping this medicine. Women should inform their doctor if they wish to become pregnant or think they might be pregnant. There is a potential for serious side effects to an unborn child. Talk to your health care professional or pharmacist for more information. Do not breast-feed an infant while taking this medicine. Men should   inform their doctors if they wish to father a child. This medicine may lower sperm counts. Talk to your health care professional or pharmacist for more information. What side effects may I notice from receiving this medicine? Side effects that you should  report to your doctor or health care professional as soon as possible: -allergic reactions like skin rash, itching or hives, swelling of the face, lips, or tongue -bone pain -breathing problems -changes in vision -chest pain -feeling faint or lightheaded, falls -fever, chills -pain, swelling, warmth in the leg -pain, tingling, numbness in the hands or feet -signs and symptoms of low blood pressure like dizziness; feeling faint or lightheaded, falls; unusually weak or tired -stomach pain -swelling of the ankles, feet, hands -trouble passing urine or change in the amount of urine -unusually high or low blood pressure -unusually weak or tired Side effects that usually do not require medical attention (report to your doctor or health care professional if they continue or are bothersome): -change in sex drive or performance -changes in breast size in both males and females -changes in emotions or moods -headache -hot flashes -irritation at site where injected -loss of appetite -skin problems like acne, dry skin -vaginal dryness This list may not describe all possible side effects. Call your doctor for medical advice about side effects. You may report side effects to FDA at 1-800-FDA-1088. Where should I keep my medicine? This drug is given in a hospital or clinic and will not be stored at home. NOTE: This sheet is a summary. It may not cover all possible information. If you have questions about this medicine, talk to your doctor, pharmacist, or health care provider.    2016, Elsevier/Gold Standard. (2013-05-14 11:10:35)  

## 2016-04-12 MED FILL — EXEMESTANE 25 MG TABLET: 25 | 90 days supply | Qty: 90 | Fill #2

## 2016-05-02 ENCOUNTER — Ambulatory Visit (HOSPITAL_BASED_OUTPATIENT_CLINIC_OR_DEPARTMENT_OTHER): Payer: 59

## 2016-05-02 VITALS — BP 149/83 | HR 75 | Temp 98.3°F | Resp 18

## 2016-05-02 DIAGNOSIS — C50412 Malignant neoplasm of upper-outer quadrant of left female breast: Secondary | ICD-10-CM | POA: Diagnosis not present

## 2016-05-02 DIAGNOSIS — Z5111 Encounter for antineoplastic chemotherapy: Secondary | ICD-10-CM | POA: Diagnosis not present

## 2016-05-02 DIAGNOSIS — C779 Secondary and unspecified malignant neoplasm of lymph node, unspecified: Secondary | ICD-10-CM | POA: Diagnosis not present

## 2016-05-02 MED ORDER — GOSERELIN ACETATE 3.6 MG ~~LOC~~ IMPL
3.6000 mg | DRUG_IMPLANT | Freq: Once | SUBCUTANEOUS | Status: AC
  Administered 2016-05-02: 3.6 mg via SUBCUTANEOUS
  Filled 2016-05-02: qty 3.6

## 2016-05-02 NOTE — Patient Instructions (Signed)
Goserelin injection What is this medicine? GOSERELIN (GOE se rel in) is similar to a hormone found in the body. It lowers the amount of sex hormones that the body makes. Men will have lower testosterone levels and women will have lower estrogen levels while taking this medicine. In men, this medicine is used to treat prostate cancer; the injection is either given once per month or once every 12 weeks. A once per month injection (only) is used to treat women with endometriosis, dysfunctional uterine bleeding, or advanced breast cancer. This medicine may be used for other purposes; ask your health care provider or pharmacist if you have questions. What should I tell my health care provider before I take this medicine? They need to know if you have any of these conditions (some only apply to women): -diabetes -heart disease or previous heart attack -high blood pressure -high cholesterol -kidney disease -osteoporosis or low bone density -problems passing urine -spinal cord injury -stroke -tobacco smoker -an unusual or allergic reaction to goserelin, hormone therapy, other medicines, foods, dyes, or preservatives -pregnant or trying to get pregnant -breast-feeding How should I use this medicine? This medicine is for injection under the skin. It is given by a health care professional in a hospital or clinic setting. Men receive this injection once every 4 weeks or once every 12 weeks. Women will only receive the once every 4 weeks injection. Talk to your pediatrician regarding the use of this medicine in children. Special care may be needed. Overdosage: If you think you have taken too much of this medicine contact a poison control center or emergency room at once. NOTE: This medicine is only for you. Do not share this medicine with others. What if I miss a dose? It is important not to miss your dose. Call your doctor or health care professional if you are unable to keep an appointment. What may  interact with this medicine? -female hormones like estrogen -herbal or dietary supplements like black cohosh, chasteberry, or DHEA -female hormones like testosterone -prasterone This list may not describe all possible interactions. Give your health care provider a list of all the medicines, herbs, non-prescription drugs, or dietary supplements you use. Also tell them if you smoke, drink alcohol, or use illegal drugs. Some items may interact with your medicine. What should I watch for while using this medicine? Visit your doctor or health care professional for regular checks on your progress. Your symptoms may appear to get worse during the first weeks of this therapy. Tell your doctor or healthcare professional if your symptoms do not start to get better or if they get worse after this time. Your bones may get weaker if you take this medicine for a long time. If you smoke or frequently drink alcohol you may increase your risk of bone loss. A family history of osteoporosis, chronic use of drugs for seizures (convulsions), or corticosteroids can also increase your risk of bone loss. Talk to your doctor about how to keep your bones strong. This medicine should stop regular monthly menstration in women. Tell your doctor if you continue to menstrate. Women should not become pregnant while taking this medicine or for 12 weeks after stopping this medicine. Women should inform their doctor if they wish to become pregnant or think they might be pregnant. There is a potential for serious side effects to an unborn child. Talk to your health care professional or pharmacist for more information. Do not breast-feed an infant while taking this medicine. Men should   inform their doctors if they wish to father a child. This medicine may lower sperm counts. Talk to your health care professional or pharmacist for more information. What side effects may I notice from receiving this medicine? Side effects that you should  report to your doctor or health care professional as soon as possible: -allergic reactions like skin rash, itching or hives, swelling of the face, lips, or tongue -bone pain -breathing problems -changes in vision -chest pain -feeling faint or lightheaded, falls -fever, chills -pain, swelling, warmth in the leg -pain, tingling, numbness in the hands or feet -signs and symptoms of low blood pressure like dizziness; feeling faint or lightheaded, falls; unusually weak or tired -stomach pain -swelling of the ankles, feet, hands -trouble passing urine or change in the amount of urine -unusually high or low blood pressure -unusually weak or tired Side effects that usually do not require medical attention (report to your doctor or health care professional if they continue or are bothersome): -change in sex drive or performance -changes in breast size in both males and females -changes in emotions or moods -headache -hot flashes -irritation at site where injected -loss of appetite -skin problems like acne, dry skin -vaginal dryness This list may not describe all possible side effects. Call your doctor for medical advice about side effects. You may report side effects to FDA at 1-800-FDA-1088. Where should I keep my medicine? This drug is given in a hospital or clinic and will not be stored at home. NOTE: This sheet is a summary. It may not cover all possible information. If you have questions about this medicine, talk to your doctor, pharmacist, or health care provider.    2016, Elsevier/Gold Standard. (2013-05-14 11:10:35)  

## 2016-05-27 ENCOUNTER — Other Ambulatory Visit: Payer: Self-pay | Admitting: Hematology and Oncology

## 2016-05-30 ENCOUNTER — Ambulatory Visit: Payer: 59

## 2016-05-30 ENCOUNTER — Telehealth: Payer: Self-pay | Admitting: Hematology and Oncology

## 2016-05-30 NOTE — Telephone Encounter (Signed)
Called patient to follow up and reschedule. but there was no answer. Left message.

## 2016-06-01 ENCOUNTER — Other Ambulatory Visit: Payer: Self-pay | Admitting: Internal Medicine

## 2016-06-01 MED FILL — AMLODIPINE BESYLATE 5 MG TA: 5 | 90 days supply | Qty: 90 | Fill #0

## 2016-06-03 ENCOUNTER — Ambulatory Visit (HOSPITAL_BASED_OUTPATIENT_CLINIC_OR_DEPARTMENT_OTHER): Payer: 59

## 2016-06-03 VITALS — BP 150/77 | HR 69 | Temp 97.8°F | Resp 18

## 2016-06-03 DIAGNOSIS — C779 Secondary and unspecified malignant neoplasm of lymph node, unspecified: Secondary | ICD-10-CM

## 2016-06-03 DIAGNOSIS — C50412 Malignant neoplasm of upper-outer quadrant of left female breast: Secondary | ICD-10-CM | POA: Diagnosis not present

## 2016-06-03 DIAGNOSIS — Z5111 Encounter for antineoplastic chemotherapy: Secondary | ICD-10-CM

## 2016-06-03 MED ORDER — GOSERELIN ACETATE 3.6 MG ~~LOC~~ IMPL
3.6000 mg | DRUG_IMPLANT | Freq: Once | SUBCUTANEOUS | Status: AC
  Administered 2016-06-03: 3.6 mg via SUBCUTANEOUS
  Filled 2016-06-03: qty 3.6

## 2016-06-03 NOTE — Patient Instructions (Signed)
Goserelin injection What is this medicine? GOSERELIN (GOE se rel in) is similar to a hormone found in the body. It lowers the amount of sex hormones that the body makes. Men will have lower testosterone levels and women will have lower estrogen levels while taking this medicine. In men, this medicine is used to treat prostate cancer; the injection is either given once per month or once every 12 weeks. A once per month injection (only) is used to treat women with endometriosis, dysfunctional uterine bleeding, or advanced breast cancer. This medicine may be used for other purposes; ask your health care provider or pharmacist if you have questions. What should I tell my health care provider before I take this medicine? They need to know if you have any of these conditions (some only apply to women): -diabetes -heart disease or previous heart attack -high blood pressure -high cholesterol -kidney disease -osteoporosis or low bone density -problems passing urine -spinal cord injury -stroke -tobacco smoker -an unusual or allergic reaction to goserelin, hormone therapy, other medicines, foods, dyes, or preservatives -pregnant or trying to get pregnant -breast-feeding How should I use this medicine? This medicine is for injection under the skin. It is given by a health care professional in a hospital or clinic setting. Men receive this injection once every 4 weeks or once every 12 weeks. Women will only receive the once every 4 weeks injection. Talk to your pediatrician regarding the use of this medicine in children. Special care may be needed. Overdosage: If you think you have taken too much of this medicine contact a poison control center or emergency room at once. NOTE: This medicine is only for you. Do not share this medicine with others. What if I miss a dose? It is important not to miss your dose. Call your doctor or health care professional if you are unable to keep an appointment. What may  interact with this medicine? -female hormones like estrogen -herbal or dietary supplements like black cohosh, chasteberry, or DHEA -female hormones like testosterone -prasterone This list may not describe all possible interactions. Give your health care provider a list of all the medicines, herbs, non-prescription drugs, or dietary supplements you use. Also tell them if you smoke, drink alcohol, or use illegal drugs. Some items may interact with your medicine. What should I watch for while using this medicine? Visit your doctor or health care professional for regular checks on your progress. Your symptoms may appear to get worse during the first weeks of this therapy. Tell your doctor or healthcare professional if your symptoms do not start to get better or if they get worse after this time. Your bones may get weaker if you take this medicine for a long time. If you smoke or frequently drink alcohol you may increase your risk of bone loss. A family history of osteoporosis, chronic use of drugs for seizures (convulsions), or corticosteroids can also increase your risk of bone loss. Talk to your doctor about how to keep your bones strong. This medicine should stop regular monthly menstration in women. Tell your doctor if you continue to menstrate. Women should not become pregnant while taking this medicine or for 12 weeks after stopping this medicine. Women should inform their doctor if they wish to become pregnant or think they might be pregnant. There is a potential for serious side effects to an unborn child. Talk to your health care professional or pharmacist for more information. Do not breast-feed an infant while taking this medicine. Men should   inform their doctors if they wish to father a child. This medicine may lower sperm counts. Talk to your health care professional or pharmacist for more information. What side effects may I notice from receiving this medicine? Side effects that you should  report to your doctor or health care professional as soon as possible: -allergic reactions like skin rash, itching or hives, swelling of the face, lips, or tongue -bone pain -breathing problems -changes in vision -chest pain -feeling faint or lightheaded, falls -fever, chills -pain, swelling, warmth in the leg -pain, tingling, numbness in the hands or feet -signs and symptoms of low blood pressure like dizziness; feeling faint or lightheaded, falls; unusually weak or tired -stomach pain -swelling of the ankles, feet, hands -trouble passing urine or change in the amount of urine -unusually high or low blood pressure -unusually weak or tired Side effects that usually do not require medical attention (report to your doctor or health care professional if they continue or are bothersome): -change in sex drive or performance -changes in breast size in both males and females -changes in emotions or moods -headache -hot flashes -irritation at site where injected -loss of appetite -skin problems like acne, dry skin -vaginal dryness This list may not describe all possible side effects. Call your doctor for medical advice about side effects. You may report side effects to FDA at 1-800-FDA-1088. Where should I keep my medicine? This drug is given in a hospital or clinic and will not be stored at home. NOTE: This sheet is a summary. It may not cover all possible information. If you have questions about this medicine, talk to your doctor, pharmacist, or health care provider.    2016, Elsevier/Gold Standard. (2013-05-14 11:10:35)  

## 2016-06-27 ENCOUNTER — Ambulatory Visit (HOSPITAL_BASED_OUTPATIENT_CLINIC_OR_DEPARTMENT_OTHER): Payer: 59

## 2016-06-27 VITALS — BP 147/84 | HR 77 | Temp 98.5°F | Resp 20

## 2016-06-27 DIAGNOSIS — Z5111 Encounter for antineoplastic chemotherapy: Secondary | ICD-10-CM

## 2016-06-27 DIAGNOSIS — C779 Secondary and unspecified malignant neoplasm of lymph node, unspecified: Secondary | ICD-10-CM

## 2016-06-27 DIAGNOSIS — C50412 Malignant neoplasm of upper-outer quadrant of left female breast: Secondary | ICD-10-CM | POA: Diagnosis not present

## 2016-06-27 MED ORDER — GOSERELIN ACETATE 3.6 MG ~~LOC~~ IMPL
3.6000 mg | DRUG_IMPLANT | Freq: Once | SUBCUTANEOUS | Status: AC
  Administered 2016-06-27: 3.6 mg via SUBCUTANEOUS
  Filled 2016-06-27: qty 3.6

## 2016-06-27 NOTE — Patient Instructions (Signed)
Goserelin injection What is this medicine? GOSERELIN (GOE se rel in) is similar to a hormone found in the body. It lowers the amount of sex hormones that the body makes. Men will have lower testosterone levels and women will have lower estrogen levels while taking this medicine. In men, this medicine is used to treat prostate cancer; the injection is either given once per month or once every 12 weeks. A once per month injection (only) is used to treat women with endometriosis, dysfunctional uterine bleeding, or advanced breast cancer. This medicine may be used for other purposes; ask your health care provider or pharmacist if you have questions. What should I tell my health care provider before I take this medicine? They need to know if you have any of these conditions (some only apply to women): -diabetes -heart disease or previous heart attack -high blood pressure -high cholesterol -kidney disease -osteoporosis or low bone density -problems passing urine -spinal cord injury -stroke -tobacco smoker -an unusual or allergic reaction to goserelin, hormone therapy, other medicines, foods, dyes, or preservatives -pregnant or trying to get pregnant -breast-feeding How should I use this medicine? This medicine is for injection under the skin. It is given by a health care professional in a hospital or clinic setting. Men receive this injection once every 4 weeks or once every 12 weeks. Women will only receive the once every 4 weeks injection. Talk to your pediatrician regarding the use of this medicine in children. Special care may be needed. Overdosage: If you think you have taken too much of this medicine contact a poison control center or emergency room at once. NOTE: This medicine is only for you. Do not share this medicine with others. What if I miss a dose? It is important not to miss your dose. Call your doctor or health care professional if you are unable to keep an appointment. What may  interact with this medicine? -female hormones like estrogen -herbal or dietary supplements like black cohosh, chasteberry, or DHEA -female hormones like testosterone -prasterone This list may not describe all possible interactions. Give your health care provider a list of all the medicines, herbs, non-prescription drugs, or dietary supplements you use. Also tell them if you smoke, drink alcohol, or use illegal drugs. Some items may interact with your medicine. What should I watch for while using this medicine? Visit your doctor or health care professional for regular checks on your progress. Your symptoms may appear to get worse during the first weeks of this therapy. Tell your doctor or healthcare professional if your symptoms do not start to get better or if they get worse after this time. Your bones may get weaker if you take this medicine for a long time. If you smoke or frequently drink alcohol you may increase your risk of bone loss. A family history of osteoporosis, chronic use of drugs for seizures (convulsions), or corticosteroids can also increase your risk of bone loss. Talk to your doctor about how to keep your bones strong. This medicine should stop regular monthly menstration in women. Tell your doctor if you continue to menstrate. Women should not become pregnant while taking this medicine or for 12 weeks after stopping this medicine. Women should inform their doctor if they wish to become pregnant or think they might be pregnant. There is a potential for serious side effects to an unborn child. Talk to your health care professional or pharmacist for more information. Do not breast-feed an infant while taking this medicine. Men should   inform their doctors if they wish to father a child. This medicine may lower sperm counts. Talk to your health care professional or pharmacist for more information. What side effects may I notice from receiving this medicine? Side effects that you should  report to your doctor or health care professional as soon as possible: -allergic reactions like skin rash, itching or hives, swelling of the face, lips, or tongue -bone pain -breathing problems -changes in vision -chest pain -feeling faint or lightheaded, falls -fever, chills -pain, swelling, warmth in the leg -pain, tingling, numbness in the hands or feet -signs and symptoms of low blood pressure like dizziness; feeling faint or lightheaded, falls; unusually weak or tired -stomach pain -swelling of the ankles, feet, hands -trouble passing urine or change in the amount of urine -unusually high or low blood pressure -unusually weak or tired Side effects that usually do not require medical attention (report to your doctor or health care professional if they continue or are bothersome): -change in sex drive or performance -changes in breast size in both males and females -changes in emotions or moods -headache -hot flashes -irritation at site where injected -loss of appetite -skin problems like acne, dry skin -vaginal dryness This list may not describe all possible side effects. Call your doctor for medical advice about side effects. You may report side effects to FDA at 1-800-FDA-1088. Where should I keep my medicine? This drug is given in a hospital or clinic and will not be stored at home. NOTE: This sheet is a summary. It may not cover all possible information. If you have questions about this medicine, talk to your doctor, pharmacist, or health care provider.    2016, Elsevier/Gold Standard. (2013-05-14 11:10:35)  

## 2016-07-26 ENCOUNTER — Other Ambulatory Visit: Payer: Self-pay | Admitting: Hematology and Oncology

## 2016-07-26 ENCOUNTER — Ambulatory Visit (HOSPITAL_BASED_OUTPATIENT_CLINIC_OR_DEPARTMENT_OTHER): Payer: 59 | Admitting: Hematology and Oncology

## 2016-07-26 ENCOUNTER — Ambulatory Visit (HOSPITAL_BASED_OUTPATIENT_CLINIC_OR_DEPARTMENT_OTHER): Payer: 59

## 2016-07-26 DIAGNOSIS — Z5111 Encounter for antineoplastic chemotherapy: Secondary | ICD-10-CM

## 2016-07-26 DIAGNOSIS — C50412 Malignant neoplasm of upper-outer quadrant of left female breast: Secondary | ICD-10-CM

## 2016-07-26 DIAGNOSIS — C779 Secondary and unspecified malignant neoplasm of lymph node, unspecified: Secondary | ICD-10-CM | POA: Diagnosis not present

## 2016-07-26 DIAGNOSIS — Z170 Estrogen receptor positive status [ER+]: Secondary | ICD-10-CM

## 2016-07-26 MED ORDER — EXEMESTANE 25 MG PO TABS: ORAL_TABLET | ORAL | 3 refills | Status: DC

## 2016-07-26 MED ORDER — GOSERELIN ACETATE 3.6 MG ~~LOC~~ IMPL
3.6000 mg | DRUG_IMPLANT | Freq: Once | SUBCUTANEOUS | Status: AC
  Administered 2016-07-26: 3.6 mg via SUBCUTANEOUS
  Filled 2016-07-26: qty 3.6

## 2016-07-26 NOTE — Patient Instructions (Signed)
Goserelin injection What is this medicine? GOSERELIN (GOE se rel in) is similar to a hormone found in the body. It lowers the amount of sex hormones that the body makes. Men will have lower testosterone levels and women will have lower estrogen levels while taking this medicine. In men, this medicine is used to treat prostate cancer; the injection is either given once per month or once every 12 weeks. A once per month injection (only) is used to treat women with endometriosis, dysfunctional uterine bleeding, or advanced breast cancer. This medicine may be used for other purposes; ask your health care provider or pharmacist if you have questions. What should I tell my health care provider before I take this medicine? They need to know if you have any of these conditions (some only apply to women): -diabetes -heart disease or previous heart attack -high blood pressure -high cholesterol -kidney disease -osteoporosis or low bone density -problems passing urine -spinal cord injury -stroke -tobacco smoker -an unusual or allergic reaction to goserelin, hormone therapy, other medicines, foods, dyes, or preservatives -pregnant or trying to get pregnant -breast-feeding How should I use this medicine? This medicine is for injection under the skin. It is given by a health care professional in a hospital or clinic setting. Men receive this injection once every 4 weeks or once every 12 weeks. Women will only receive the once every 4 weeks injection. Talk to your pediatrician regarding the use of this medicine in children. Special care may be needed. Overdosage: If you think you have taken too much of this medicine contact a poison control center or emergency room at once. NOTE: This medicine is only for you. Do not share this medicine with others. What if I miss a dose? It is important not to miss your dose. Call your doctor or health care professional if you are unable to keep an appointment. What may  interact with this medicine? -female hormones like estrogen -herbal or dietary supplements like black cohosh, chasteberry, or DHEA -female hormones like testosterone -prasterone This list may not describe all possible interactions. Give your health care provider a list of all the medicines, herbs, non-prescription drugs, or dietary supplements you use. Also tell them if you smoke, drink alcohol, or use illegal drugs. Some items may interact with your medicine. What should I watch for while using this medicine? Visit your doctor or health care professional for regular checks on your progress. Your symptoms may appear to get worse during the first weeks of this therapy. Tell your doctor or healthcare professional if your symptoms do not start to get better or if they get worse after this time. Your bones may get weaker if you take this medicine for a long time. If you smoke or frequently drink alcohol you may increase your risk of bone loss. A family history of osteoporosis, chronic use of drugs for seizures (convulsions), or corticosteroids can also increase your risk of bone loss. Talk to your doctor about how to keep your bones strong. This medicine should stop regular monthly menstration in women. Tell your doctor if you continue to menstrate. Women should not become pregnant while taking this medicine or for 12 weeks after stopping this medicine. Women should inform their doctor if they wish to become pregnant or think they might be pregnant. There is a potential for serious side effects to an unborn child. Talk to your health care professional or pharmacist for more information. Do not breast-feed an infant while taking this medicine. Men should   inform their doctors if they wish to father a child. This medicine may lower sperm counts. Talk to your health care professional or pharmacist for more information. What side effects may I notice from receiving this medicine? Side effects that you should  report to your doctor or health care professional as soon as possible: -allergic reactions like skin rash, itching or hives, swelling of the face, lips, or tongue -bone pain -breathing problems -changes in vision -chest pain -feeling faint or lightheaded, falls -fever, chills -pain, swelling, warmth in the leg -pain, tingling, numbness in the hands or feet -signs and symptoms of low blood pressure like dizziness; feeling faint or lightheaded, falls; unusually weak or tired -stomach pain -swelling of the ankles, feet, hands -trouble passing urine or change in the amount of urine -unusually high or low blood pressure -unusually weak or tired Side effects that usually do not require medical attention (report to your doctor or health care professional if they continue or are bothersome): -change in sex drive or performance -changes in breast size in both males and females -changes in emotions or moods -headache -hot flashes -irritation at site where injected -loss of appetite -skin problems like acne, dry skin -vaginal dryness This list may not describe all possible side effects. Call your doctor for medical advice about side effects. You may report side effects to FDA at 1-800-FDA-1088. Where should I keep my medicine? This drug is given in a hospital or clinic and will not be stored at home. NOTE: This sheet is a summary. It may not cover all possible information. If you have questions about this medicine, talk to your doctor, pharmacist, or health care provider.    2016, Elsevier/Gold Standard. (2013-05-14 11:10:35)  

## 2016-07-26 NOTE — Assessment & Plan Note (Signed)
Right breast invasive lobular carcinoma ER 95%, PR 53 %, Ki 67 85%, HER-2 negative with lymphovascular invasion status post neoadjuvant chemotherapy followed by bilateral mastectomies 05/18/2012 showed invasive lobular cancer 2.3 cm, 8/17 lymph nodes positive T2 N2 M0 stage IIIa;  left breast showed DCIS Status post right breast radiation currently on tamoxifen with Zoladex monthly Started 08/19/2012  Tamoxifen toxicities: 1. Occasional hot flashes and night sweats  2. Bone density showed T score -0.2 and -0.3.  Recommendation: 1. Zoladex with exemestane  Patient was not menopausal based on testing in April 2017, hence we continue monthly Zoladex injections.  Breast cancer surveillance: 1. No role of imaging studies since she had bilateral mastectomies 2. Chest wall and axillary examination 07/26/2016 is normal  FSH and estradiol were not in the menopausal range done on 06/26/2015 ([place] 14.6, estradiol 15). I recommended continuation of Zoladex injections along with aromatase inhibitor therapy.  Return to clinic in 1 year

## 2016-07-26 NOTE — Progress Notes (Signed)
Patient Care Team: [name], MD as PCP - General (Internal Medicine)  DIAGNOSIS:  Encounter Diagnosis  Name Primary?  . Primary cancer of upper outer quadrant of left female breast ([place])     SUMMARY OF ONCOLOGIC HISTORY:   Primary cancer of upper outer quadrant of left female breast ([place])   11/17/2011 Initial Diagnosis    Cancer of upper-outer quadrant of female breast diagnosed when pt presented with Nipple flattening and palpable lesion.      12/08/2011 - 03/29/2012 Chemotherapy    Neo adjuvant FEC X 4 followed by Weekly Taxol X 12      05/18/2012 Surgery    Bilateral Mastectomies: Right revealed invasive  Lobular carcinoma measuring 2.3 cm 8 of 17 lymph nodes were positive (T2 N2A). 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      07/04/2012 - 08/17/2012 Radiation Therapy    Rt Breast irradiation      08/19/2012 -  Anti-estrogen oral therapy    Aromasin + Zolodex Monthly       CHIEF COMPLIANT: Follow-up on Aromasin and Zoladex  INTERVAL HISTORY: Mary [name] is a 56 year old with above-mentioned history of left breast cancer treated with neoadjuvant chemotherapy followed by bilateral mastectomies and radiation who is here for adjuvant antiestrogen therapy with Aromasin along with Zoladex injections.  REVIEW OF SYSTEMS:   Constitutional: Denies fevers, chills or abnormal weight loss Eyes: Denies blurriness of vision Ears, nose, mouth, throat, and face: Denies mucositis or sore throat Respiratory: Denies cough, dyspnea or wheezes Cardiovascular: Denies palpitation, chest discomfort Gastrointestinal:  Denies nausea, heartburn or change in bowel habits Skin: Denies abnormal skin rashes Lymphatics: Denies new lymphadenopathy or easy bruising Neurological:Denies numbness, tingling or new weaknesses Behavioral/Psych: Mood is stable, no new changes  Extremities: No lower extremity edema Breast: Prior bilateral  mastectomies All other systems were reviewed with the patient and are negative.  I have reviewed the past medical history, past surgical history, social history and family history with the patient and they are unchanged from previous note.  ALLERGIES:  is allergic to adhesive [tape].  MEDICATIONS:  Current Outpatient Prescriptions  Medication Sig Dispense Refill  . ALPRAZolam (XANAX) 0.25 MG tablet TAKE ONE TABLET BY MOUTH TWICE DAILY AS NEEDED FOR ANXIETY 60 tablet 4  . amLODipine (NORVASC) 5 MG tablet TAKE 1 TABLET BY MOUTH DAILY. 30 tablet 4  . Ascorbic Acid (VITAMIN C) 100 MG tablet Take 100 mg by mouth daily.      . Calcium Carbonate-Vitamin D (CALTRATE 600+D PO) Take 1 tablet by mouth daily.    . cholecalciferol (VITAMIN D) 400 UNITS TABS Take 1,000 Units by mouth daily.     Marland Kitchen esomeprazole (NEXIUM) 40 MG capsule Take 40 mg by mouth daily before breakfast.    . exemestane (AROMASIN) 25 MG tablet TAKE 1 TABLET BY MOUTH ONCE DAILY AFTER BREAKFAST 90 tablet 3  . goserelin (ZOLADEX) 3.6 MG injection Inject 3.6 mg into the skin every 28 (twenty-eight) days.    Marland Kitchen loperamide (IMODIUM A-D) 2 MG tablet Take 2 mg by mouth 4 (four) times daily as needed for diarrhea or loose stools.     . Multiple Vitamin (MULTIVITAMIN) tablet Take 1 tablet by mouth daily.      Marland Kitchen sulfamethoxazole-trimethoprim (BACTRIM DS,SEPTRA DS) 800-160 MG tablet Take 1 tablet by mouth 2 (two) times daily. 6 tablet 0   No current facility-administered medications for this visit.     PHYSICAL EXAMINATION:  ECOG PERFORMANCE STATUS: 1 - Symptomatic but completely ambulatory  There were no vitals filed for this visit. There were no vitals filed for this visit.  GENERAL:alert, no distress and comfortable SKIN: skin color, texture, turgor are normal, no rashes or significant lesions EYES: normal, Conjunctiva are pink and non-injected, sclera clear OROPHARYNX:no exudate, no erythema and lips, buccal mucosa, and tongue normal   NECK: supple, thyroid normal size, non-tender, without nodularity LYMPH:  no palpable lymphadenopathy in the cervical, axillary or inguinal LUNGS: clear to auscultation and percussion with normal breathing effort HEART: regular rate & rhythm and no murmurs and no lower extremity edema ABDOMEN:abdomen soft, non-tender and normal bowel sounds MUSCULOSKELETAL:no cyanosis of digits and no clubbing  NEURO: alert & oriented x 3 with fluent speech, no focal motor/sensory deficits EXTREMITIES: No lower extremity edema BREAST: No palpable lumps or nodules bilateral chest wall or axilla. (exam performed in the presence of a chaperone)  LABORATORY DATA:  I have reviewed the data as listed   Chemistry      Component Value Date/Time   NA 141 09/19/2014 1039   NA 141 04/29/2014 1327   K 3.7 09/19/2014 1039   K 4.0 04/29/2014 1327   CL 103 09/19/2014 1039   CL 106 08/20/2012 0802   CO2 26 09/19/2014 1039   CO2 24 04/29/2014 1327   BUN 16 09/19/2014 1039   BUN 15.7 04/29/2014 1327   CREATININE 0.93 09/19/2014 1039   CREATININE 0.9 04/29/2014 1327      Component Value Date/Time   CALCIUM 9.7 09/19/2014 1039   CALCIUM 9.7 04/29/2014 1327   ALKPHOS 108 09/19/2014 1039   ALKPHOS 124 04/29/2014 1327   AST 18 09/19/2014 1039   AST 21 04/29/2014 1327   ALT 21 09/19/2014 1039   ALT 30 04/29/2014 1327   BILITOT 0.6 09/19/2014 1039   BILITOT 0.34 04/29/2014 1327       Lab Results  Component Value Date   WBC 6.4 04/29/2014   HGB 14.8 04/29/2014   HCT 43.8 04/29/2014   MCV 95.0 04/29/2014   PLT 206 04/29/2014   NEUTROABS 3.9 04/29/2014    ASSESSMENT & PLAN:  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  Current treatment: 1. Zoladex with exemestane  Patient was not menopausal based on testing in April 2017, hence we continued q 3 monthly Zoladex injections.  Breast cancer surveillance: 1. No role of imaging studies since she had bilateral mastectomies 2. Chest wall and axillary examination 07/26/2016 is normal  FSH and estradiol were not in the menopausal range done on 06/26/2015 ([place] 14.6, estradiol 15). I recommended continuation of Zoladex injections along with aromatase inhibitor therapy.  Return to clinic in 1 year   I spent 25 minutes talking to the patient of which more than half was spent in counseling and coordination of care.  No orders of the defined types were placed in this encounter.  The patient has a good understanding of the overall plan. she agrees with it. she will call with any problems  that may develop before the next visit here.   Rulon Eisenmenger, MD 07/26/16

## 2016-07-28 ENCOUNTER — Other Ambulatory Visit: Payer: Self-pay | Admitting: Hematology and Oncology

## 2016-07-28 DIAGNOSIS — C50412 Malignant neoplasm of upper-outer quadrant of left female breast: Secondary | ICD-10-CM

## 2016-07-29 ENCOUNTER — Other Ambulatory Visit: Payer: Self-pay | Admitting: *Deleted

## 2016-07-29 DIAGNOSIS — C50412 Malignant neoplasm of upper-outer quadrant of left female breast: Secondary | ICD-10-CM

## 2016-07-29 MED ORDER — EXEMESTANE 25 MG PO TABS: ORAL_TABLET | ORAL | 3 refills | Status: DC

## 2016-07-29 MED FILL — EXEMESTANE 25 MG TABLET: 25 | 90 days supply | Qty: 90 | Fill #0

## 2016-08-23 ENCOUNTER — Ambulatory Visit: Payer: 59

## 2016-08-30 ENCOUNTER — Ambulatory Visit (HOSPITAL_BASED_OUTPATIENT_CLINIC_OR_DEPARTMENT_OTHER): Payer: 59

## 2016-08-30 VITALS — BP 138/81 | HR 76 | Temp 97.5°F | Resp 20

## 2016-08-30 DIAGNOSIS — C50412 Malignant neoplasm of upper-outer quadrant of left female breast: Secondary | ICD-10-CM | POA: Diagnosis not present

## 2016-08-30 DIAGNOSIS — C779 Secondary and unspecified malignant neoplasm of lymph node, unspecified: Secondary | ICD-10-CM

## 2016-08-30 DIAGNOSIS — Z5111 Encounter for antineoplastic chemotherapy: Secondary | ICD-10-CM

## 2016-08-30 MED ORDER — GOSERELIN ACETATE 3.6 MG ~~LOC~~ IMPL
3.6000 mg | DRUG_IMPLANT | Freq: Once | SUBCUTANEOUS | Status: AC
  Administered 2016-08-30: 3.6 mg via SUBCUTANEOUS
  Filled 2016-08-30: qty 3.6

## 2016-08-30 NOTE — Patient Instructions (Signed)
Goserelin injection What is this medicine? GOSERELIN (GOE se rel in) is similar to a hormone found in the body. It lowers the amount of sex hormones that the body makes. Men will have lower testosterone levels and women will have lower estrogen levels while taking this medicine. In men, this medicine is used to treat prostate cancer; the injection is either given once per month or once every 12 weeks. A once per month injection (only) is used to treat women with endometriosis, dysfunctional uterine bleeding, or advanced breast cancer. This medicine may be used for other purposes; ask your health care provider or pharmacist if you have questions. What should I tell my health care provider before I take this medicine? They need to know if you have any of these conditions (some only apply to women): -diabetes -heart disease or previous heart attack -high blood pressure -high cholesterol -kidney disease -osteoporosis or low bone density -problems passing urine -spinal cord injury -stroke -tobacco smoker -an unusual or allergic reaction to goserelin, hormone therapy, other medicines, foods, dyes, or preservatives -pregnant or trying to get pregnant -breast-feeding How should I use this medicine? This medicine is for injection under the skin. It is given by a health care professional in a hospital or clinic setting. Men receive this injection once every 4 weeks or once every 12 weeks. Women will only receive the once every 4 weeks injection. Talk to your pediatrician regarding the use of this medicine in children. Special care may be needed. Overdosage: If you think you have taken too much of this medicine contact a poison control center or emergency room at once. NOTE: This medicine is only for you. Do not share this medicine with others. What if I miss a dose? It is important not to miss your dose. Call your doctor or health care professional if you are unable to keep an appointment. What may  interact with this medicine? -female hormones like estrogen -herbal or dietary supplements like black cohosh, chasteberry, or DHEA -female hormones like testosterone -prasterone This list may not describe all possible interactions. Give your health care provider a list of all the medicines, herbs, non-prescription drugs, or dietary supplements you use. Also tell them if you smoke, drink alcohol, or use illegal drugs. Some items may interact with your medicine. What should I watch for while using this medicine? Visit your doctor or health care professional for regular checks on your progress. Your symptoms may appear to get worse during the first weeks of this therapy. Tell your doctor or healthcare professional if your symptoms do not start to get better or if they get worse after this time. Your bones may get weaker if you take this medicine for a long time. If you smoke or frequently drink alcohol you may increase your risk of bone loss. A family history of osteoporosis, chronic use of drugs for seizures (convulsions), or corticosteroids can also increase your risk of bone loss. Talk to your doctor about how to keep your bones strong. This medicine should stop regular monthly menstration in women. Tell your doctor if you continue to menstrate. Women should not become pregnant while taking this medicine or for 12 weeks after stopping this medicine. Women should inform their doctor if they wish to become pregnant or think they might be pregnant. There is a potential for serious side effects to an unborn child. Talk to your health care professional or pharmacist for more information. Do not breast-feed an infant while taking this medicine. Men should   inform their doctors if they wish to father a child. This medicine may lower sperm counts. Talk to your health care professional or pharmacist for more information. What side effects may I notice from receiving this medicine? Side effects that you should  report to your doctor or health care professional as soon as possible: -allergic reactions like skin rash, itching or hives, swelling of the face, lips, or tongue -bone pain -breathing problems -changes in vision -chest pain -feeling faint or lightheaded, falls -fever, chills -pain, swelling, warmth in the leg -pain, tingling, numbness in the hands or feet -signs and symptoms of low blood pressure like dizziness; feeling faint or lightheaded, falls; unusually weak or tired -stomach pain -swelling of the ankles, feet, hands -trouble passing urine or change in the amount of urine -unusually high or low blood pressure -unusually weak or tired Side effects that usually do not require medical attention (report to your doctor or health care professional if they continue or are bothersome): -change in sex drive or performance -changes in breast size in both males and females -changes in emotions or moods -headache -hot flashes -irritation at site where injected -loss of appetite -skin problems like acne, dry skin -vaginal dryness This list may not describe all possible side effects. Call your doctor for medical advice about side effects. You may report side effects to FDA at 1-800-FDA-1088. Where should I keep my medicine? This drug is given in a hospital or clinic and will not be stored at home. NOTE: This sheet is a summary. It may not cover all possible information. If you have questions about this medicine, talk to your doctor, pharmacist, or health care provider.    2016, Elsevier/Gold Standard. (2013-05-14 11:10:35)  

## 2016-09-01 MED FILL — AMLODIPINE BESYLATE 5 MG TA: 5 | 60 days supply | Qty: 60 | Fill #1

## 2016-09-20 ENCOUNTER — Other Ambulatory Visit: Payer: Self-pay | Admitting: Hematology and Oncology

## 2016-09-20 ENCOUNTER — Ambulatory Visit: Payer: 59

## 2016-09-27 ENCOUNTER — Ambulatory Visit (HOSPITAL_BASED_OUTPATIENT_CLINIC_OR_DEPARTMENT_OTHER): Payer: 59

## 2016-09-27 VITALS — BP 127/81 | HR 76 | Temp 97.4°F | Resp 18

## 2016-09-27 DIAGNOSIS — Z5111 Encounter for antineoplastic chemotherapy: Secondary | ICD-10-CM | POA: Diagnosis not present

## 2016-09-27 DIAGNOSIS — C50412 Malignant neoplasm of upper-outer quadrant of left female breast: Secondary | ICD-10-CM | POA: Diagnosis not present

## 2016-09-27 DIAGNOSIS — C779 Secondary and unspecified malignant neoplasm of lymph node, unspecified: Secondary | ICD-10-CM

## 2016-09-27 MED ORDER — GOSERELIN ACETATE 3.6 MG ~~LOC~~ IMPL
3.6000 mg | DRUG_IMPLANT | Freq: Once | SUBCUTANEOUS | Status: AC
  Administered 2016-09-27: 3.6 mg via SUBCUTANEOUS
  Filled 2016-09-27: qty 3.6

## 2016-09-27 NOTE — Patient Instructions (Signed)
Goserelin injection What is this medicine? GOSERELIN (GOE se rel in) is similar to a hormone found in the body. It lowers the amount of sex hormones that the body makes. Men will have lower testosterone levels and women will have lower estrogen levels while taking this medicine. In men, this medicine is used to treat prostate cancer; the injection is either given once per month or once every 12 weeks. A once per month injection (only) is used to treat women with endometriosis, dysfunctional uterine bleeding, or advanced breast cancer. This medicine may be used for other purposes; ask your health care provider or pharmacist if you have questions. What should I tell my health care provider before I take this medicine? They need to know if you have any of these conditions (some only apply to women): -diabetes -heart disease or previous heart attack -high blood pressure -high cholesterol -kidney disease -osteoporosis or low bone density -problems passing urine -spinal cord injury -stroke -tobacco smoker -an unusual or allergic reaction to goserelin, hormone therapy, other medicines, foods, dyes, or preservatives -pregnant or trying to get pregnant -breast-feeding How should I use this medicine? This medicine is for injection under the skin. It is given by a health care professional in a hospital or clinic setting. Men receive this injection once every 4 weeks or once every 12 weeks. Women will only receive the once every 4 weeks injection. Talk to your pediatrician regarding the use of this medicine in children. Special care may be needed. Overdosage: If you think you have taken too much of this medicine contact a poison control center or emergency room at once. NOTE: This medicine is only for you. Do not share this medicine with others. What if I miss a dose? It is important not to miss your dose. Call your doctor or health care professional if you are unable to keep an appointment. What may  interact with this medicine? -female hormones like estrogen -herbal or dietary supplements like black cohosh, chasteberry, or DHEA -female hormones like testosterone -prasterone This list may not describe all possible interactions. Give your health care provider a list of all the medicines, herbs, non-prescription drugs, or dietary supplements you use. Also tell them if you smoke, drink alcohol, or use illegal drugs. Some items may interact with your medicine. What should I watch for while using this medicine? Visit your doctor or health care professional for regular checks on your progress. Your symptoms may appear to get worse during the first weeks of this therapy. Tell your doctor or healthcare professional if your symptoms do not start to get better or if they get worse after this time. Your bones may get weaker if you take this medicine for a long time. If you smoke or frequently drink alcohol you may increase your risk of bone loss. A family history of osteoporosis, chronic use of drugs for seizures (convulsions), or corticosteroids can also increase your risk of bone loss. Talk to your doctor about how to keep your bones strong. This medicine should stop regular monthly menstration in women. Tell your doctor if you continue to menstrate. Women should not become pregnant while taking this medicine or for 12 weeks after stopping this medicine. Women should inform their doctor if they wish to become pregnant or think they might be pregnant. There is a potential for serious side effects to an unborn child. Talk to your health care professional or pharmacist for more information. Do not breast-feed an infant while taking this medicine. Men should   inform their doctors if they wish to father a child. This medicine may lower sperm counts. Talk to your health care professional or pharmacist for more information. What side effects may I notice from receiving this medicine? Side effects that you should  report to your doctor or health care professional as soon as possible: -allergic reactions like skin rash, itching or hives, swelling of the face, lips, or tongue -bone pain -breathing problems -changes in vision -chest pain -feeling faint or lightheaded, falls -fever, chills -pain, swelling, warmth in the leg -pain, tingling, numbness in the hands or feet -signs and symptoms of low blood pressure like dizziness; feeling faint or lightheaded, falls; unusually weak or tired -stomach pain -swelling of the ankles, feet, hands -trouble passing urine or change in the amount of urine -unusually high or low blood pressure -unusually weak or tired Side effects that usually do not require medical attention (report to your doctor or health care professional if they continue or are bothersome): -change in sex drive or performance -changes in breast size in both males and females -changes in emotions or moods -headache -hot flashes -irritation at site where injected -loss of appetite -skin problems like acne, dry skin -vaginal dryness This list may not describe all possible side effects. Call your doctor for medical advice about side effects. You may report side effects to FDA at 1-800-FDA-1088. Where should I keep my medicine? This drug is given in a hospital or clinic and will not be stored at home. NOTE: This sheet is a summary. It may not cover all possible information. If you have questions about this medicine, talk to your doctor, pharmacist, or health care provider.    2016, Elsevier/Gold Standard. (2013-05-14 11:10:35)  

## 2016-10-18 ENCOUNTER — Ambulatory Visit: Payer: 59

## 2016-10-25 ENCOUNTER — Ambulatory Visit (HOSPITAL_BASED_OUTPATIENT_CLINIC_OR_DEPARTMENT_OTHER): Payer: 59

## 2016-10-25 VITALS — BP 137/78 | HR 73 | Temp 97.9°F | Resp 20

## 2016-10-25 DIAGNOSIS — Z5111 Encounter for antineoplastic chemotherapy: Secondary | ICD-10-CM

## 2016-10-25 DIAGNOSIS — C50412 Malignant neoplasm of upper-outer quadrant of left female breast: Secondary | ICD-10-CM

## 2016-10-25 DIAGNOSIS — C779 Secondary and unspecified malignant neoplasm of lymph node, unspecified: Secondary | ICD-10-CM | POA: Diagnosis not present

## 2016-10-25 MED ORDER — GOSERELIN ACETATE 3.6 MG ~~LOC~~ IMPL
3.6000 mg | DRUG_IMPLANT | Freq: Once | SUBCUTANEOUS | Status: AC
  Administered 2016-10-25: 3.6 mg via SUBCUTANEOUS
  Filled 2016-10-25: qty 3.6

## 2016-10-25 NOTE — Patient Instructions (Signed)
Goserelin injection What is this medicine? GOSERELIN (GOE se rel in) is similar to a hormone found in the body. It lowers the amount of sex hormones that the body makes. Men will have lower testosterone levels and women will have lower estrogen levels while taking this medicine. In men, this medicine is used to treat prostate cancer; the injection is either given once per month or once every 12 weeks. A once per month injection (only) is used to treat women with endometriosis, dysfunctional uterine bleeding, or advanced breast cancer. This medicine may be used for other purposes; ask your health care provider or pharmacist if you have questions. What should I tell my health care provider before I take this medicine? They need to know if you have any of these conditions (some only apply to women): -diabetes -heart disease or previous heart attack -high blood pressure -high cholesterol -kidney disease -osteoporosis or low bone density -problems passing urine -spinal cord injury -stroke -tobacco smoker -an unusual or allergic reaction to goserelin, hormone therapy, other medicines, foods, dyes, or preservatives -pregnant or trying to get pregnant -breast-feeding How should I use this medicine? This medicine is for injection under the skin. It is given by a health care professional in a hospital or clinic setting. Men receive this injection once every 4 weeks or once every 12 weeks. Women will only receive the once every 4 weeks injection. Talk to your pediatrician regarding the use of this medicine in children. Special care may be needed. Overdosage: If you think you have taken too much of this medicine contact a poison control center or emergency room at once. NOTE: This medicine is only for you. Do not share this medicine with others. What if I miss a dose? It is important not to miss your dose. Call your doctor or health care professional if you are unable to keep an appointment. What may  interact with this medicine? -female hormones like estrogen -herbal or dietary supplements like black cohosh, chasteberry, or DHEA -female hormones like testosterone -prasterone This list may not describe all possible interactions. Give your health care provider a list of all the medicines, herbs, non-prescription drugs, or dietary supplements you use. Also tell them if you smoke, drink alcohol, or use illegal drugs. Some items may interact with your medicine. What should I watch for while using this medicine? Visit your doctor or health care professional for regular checks on your progress. Your symptoms may appear to get worse during the first weeks of this therapy. Tell your doctor or healthcare professional if your symptoms do not start to get better or if they get worse after this time. Your bones may get weaker if you take this medicine for a long time. If you smoke or frequently drink alcohol you may increase your risk of bone loss. A family history of osteoporosis, chronic use of drugs for seizures (convulsions), or corticosteroids can also increase your risk of bone loss. Talk to your doctor about how to keep your bones strong. This medicine should stop regular monthly menstration in women. Tell your doctor if you continue to menstrate. Women should not become pregnant while taking this medicine or for 12 weeks after stopping this medicine. Women should inform their doctor if they wish to become pregnant or think they might be pregnant. There is a potential for serious side effects to an unborn child. Talk to your health care professional or pharmacist for more information. Do not breast-feed an infant while taking this medicine. Men should   inform their doctors if they wish to father a child. This medicine may lower sperm counts. Talk to your health care professional or pharmacist for more information. What side effects may I notice from receiving this medicine? Side effects that you should  report to your doctor or health care professional as soon as possible: -allergic reactions like skin rash, itching or hives, swelling of the face, lips, or tongue -bone pain -breathing problems -changes in vision -chest pain -feeling faint or lightheaded, falls -fever, chills -pain, swelling, warmth in the leg -pain, tingling, numbness in the hands or feet -signs and symptoms of low blood pressure like dizziness; feeling faint or lightheaded, falls; unusually weak or tired -stomach pain -swelling of the ankles, feet, hands -trouble passing urine or change in the amount of urine -unusually high or low blood pressure -unusually weak or tired Side effects that usually do not require medical attention (report to your doctor or health care professional if they continue or are bothersome): -change in sex drive or performance -changes in breast size in both males and females -changes in emotions or moods -headache -hot flashes -irritation at site where injected -loss of appetite -skin problems like acne, dry skin -vaginal dryness This list may not describe all possible side effects. Call your doctor for medical advice about side effects. You may report side effects to FDA at 1-800-FDA-1088. Where should I keep my medicine? This drug is given in a hospital or clinic and will not be stored at home. NOTE: This sheet is a summary. It may not cover all possible information. If you have questions about this medicine, talk to your doctor, pharmacist, or health care provider.    2016, Elsevier/Gold Standard. (2013-05-14 11:10:35)  

## 2016-11-01 ENCOUNTER — Other Ambulatory Visit: Payer: Self-pay | Admitting: Internal Medicine

## 2016-11-01 MED FILL — EXEMESTANE 25 MG TABLET: 25 | 90 days supply | Qty: 90 | Fill #1

## 2016-11-01 MED FILL — AMLODIPINE BESYLATE 5 MG TA: 5 | 30 days supply | Qty: 30 | Fill #0

## 2016-11-15 ENCOUNTER — Ambulatory Visit: Payer: 59

## 2016-11-22 ENCOUNTER — Ambulatory Visit (HOSPITAL_BASED_OUTPATIENT_CLINIC_OR_DEPARTMENT_OTHER): Payer: 59

## 2016-11-22 VITALS — BP 133/79 | HR 78 | Temp 97.8°F | Resp 18

## 2016-11-22 DIAGNOSIS — C50412 Malignant neoplasm of upper-outer quadrant of left female breast: Secondary | ICD-10-CM | POA: Diagnosis not present

## 2016-11-22 DIAGNOSIS — Z5111 Encounter for antineoplastic chemotherapy: Secondary | ICD-10-CM | POA: Diagnosis not present

## 2016-11-22 DIAGNOSIS — C779 Secondary and unspecified malignant neoplasm of lymph node, unspecified: Secondary | ICD-10-CM | POA: Diagnosis not present

## 2016-11-22 MED ORDER — GOSERELIN ACETATE 3.6 MG ~~LOC~~ IMPL
3.6000 mg | DRUG_IMPLANT | Freq: Once | SUBCUTANEOUS | Status: AC
  Administered 2016-11-22: 3.6 mg via SUBCUTANEOUS
  Filled 2016-11-22: qty 3.6

## 2016-11-22 NOTE — Patient Instructions (Signed)
Goserelin injection What is this medicine? GOSERELIN (GOE se rel in) is similar to a hormone found in the body. It lowers the amount of sex hormones that the body makes. Men will have lower testosterone levels and women will have lower estrogen levels while taking this medicine. In men, this medicine is used to treat prostate cancer; the injection is either given once per month or once every 12 weeks. A once per month injection (only) is used to treat women with endometriosis, dysfunctional uterine bleeding, or advanced breast cancer. This medicine may be used for other purposes; ask your health care provider or pharmacist if you have questions. What should I tell my health care provider before I take this medicine? They need to know if you have any of these conditions (some only apply to women): -diabetes -heart disease or previous heart attack -high blood pressure -high cholesterol -kidney disease -osteoporosis or low bone density -problems passing urine -spinal cord injury -stroke -tobacco smoker -an unusual or allergic reaction to goserelin, hormone therapy, other medicines, foods, dyes, or preservatives -pregnant or trying to get pregnant -breast-feeding How should I use this medicine? This medicine is for injection under the skin. It is given by a health care professional in a hospital or clinic setting. Men receive this injection once every 4 weeks or once every 12 weeks. Women will only receive the once every 4 weeks injection. Talk to your pediatrician regarding the use of this medicine in children. Special care may be needed. Overdosage: If you think you have taken too much of this medicine contact a poison control center or emergency room at once. NOTE: This medicine is only for you. Do not share this medicine with others. What if I miss a dose? It is important not to miss your dose. Call your doctor or health care professional if you are unable to keep an appointment. What may  interact with this medicine? -female hormones like estrogen -herbal or dietary supplements like black cohosh, chasteberry, or DHEA -female hormones like testosterone -prasterone This list may not describe all possible interactions. Give your health care provider a list of all the medicines, herbs, non-prescription drugs, or dietary supplements you use. Also tell them if you smoke, drink alcohol, or use illegal drugs. Some items may interact with your medicine. What should I watch for while using this medicine? Visit your doctor or health care professional for regular checks on your progress. Your symptoms may appear to get worse during the first weeks of this therapy. Tell your doctor or healthcare professional if your symptoms do not start to get better or if they get worse after this time. Your bones may get weaker if you take this medicine for a long time. If you smoke or frequently drink alcohol you may increase your risk of bone loss. A family history of osteoporosis, chronic use of drugs for seizures (convulsions), or corticosteroids can also increase your risk of bone loss. Talk to your doctor about how to keep your bones strong. This medicine should stop regular monthly menstration in women. Tell your doctor if you continue to menstrate. Women should not become pregnant while taking this medicine or for 12 weeks after stopping this medicine. Women should inform their doctor if they wish to become pregnant or think they might be pregnant. There is a potential for serious side effects to an unborn child. Talk to your health care professional or pharmacist for more information. Do not breast-feed an infant while taking this medicine. Men should   inform their doctors if they wish to father a child. This medicine may lower sperm counts. Talk to your health care professional or pharmacist for more information. What side effects may I notice from receiving this medicine? Side effects that you should  report to your doctor or health care professional as soon as possible: -allergic reactions like skin rash, itching or hives, swelling of the face, lips, or tongue -bone pain -breathing problems -changes in vision -chest pain -feeling faint or lightheaded, falls -fever, chills -pain, swelling, warmth in the leg -pain, tingling, numbness in the hands or feet -signs and symptoms of low blood pressure like dizziness; feeling faint or lightheaded, falls; unusually weak or tired -stomach pain -swelling of the ankles, feet, hands -trouble passing urine or change in the amount of urine -unusually high or low blood pressure -unusually weak or tired Side effects that usually do not require medical attention (report to your doctor or health care professional if they continue or are bothersome): -change in sex drive or performance -changes in breast size in both males and females -changes in emotions or moods -headache -hot flashes -irritation at site where injected -loss of appetite -skin problems like acne, dry skin -vaginal dryness This list may not describe all possible side effects. Call your doctor for medical advice about side effects. You may report side effects to FDA at 1-800-FDA-1088. Where should I keep my medicine? This drug is given in a hospital or clinic and will not be stored at home. NOTE: This sheet is a summary. It may not cover all possible information. If you have questions about this medicine, talk to your doctor, pharmacist, or health care provider.    2016, Elsevier/Gold Standard. (2013-05-14 11:10:35)  

## 2016-11-30 MED FILL — AMLODIPINE BESYLATE 5 MG TA: 5 | 30 days supply | Qty: 30 | Fill #1

## 2016-12-13 ENCOUNTER — Ambulatory Visit: Payer: 59

## 2016-12-14 NOTE — Telephone Encounter (Signed)
Error

## 2016-12-19 NOTE — Telephone Encounter (Signed)
Error

## 2016-12-20 ENCOUNTER — Ambulatory Visit: Payer: 59

## 2016-12-28 ENCOUNTER — Ambulatory Visit (HOSPITAL_BASED_OUTPATIENT_CLINIC_OR_DEPARTMENT_OTHER): Payer: 59

## 2016-12-28 VITALS — BP 138/77 | HR 90 | Temp 98.4°F | Resp 20

## 2016-12-28 DIAGNOSIS — Z5111 Encounter for antineoplastic chemotherapy: Secondary | ICD-10-CM

## 2016-12-28 DIAGNOSIS — C50412 Malignant neoplasm of upper-outer quadrant of left female breast: Secondary | ICD-10-CM

## 2016-12-28 MED ORDER — GOSERELIN ACETATE 3.6 MG ~~LOC~~ IMPL
3.6000 mg | DRUG_IMPLANT | Freq: Once | SUBCUTANEOUS | Status: AC
  Administered 2016-12-28: 3.6 mg via SUBCUTANEOUS
  Filled 2016-12-28: qty 3.6

## 2016-12-28 NOTE — Patient Instructions (Signed)
Goserelin injection What is this medicine? GOSERELIN (GOE se rel in) is similar to a hormone found in the body. It lowers the amount of sex hormones that the body makes. Men will have lower testosterone levels and women will have lower estrogen levels while taking this medicine. In men, this medicine is used to treat prostate cancer; the injection is either given once per month or once every 12 weeks. A once per month injection (only) is used to treat women with endometriosis, dysfunctional uterine bleeding, or advanced breast cancer. This medicine may be used for other purposes; ask your health care provider or pharmacist if you have questions. What should I tell my health care provider before I take this medicine? They need to know if you have any of these conditions (some only apply to women): -diabetes -heart disease or previous heart attack -high blood pressure -high cholesterol -kidney disease -osteoporosis or low bone density -problems passing urine -spinal cord injury -stroke -tobacco smoker -an unusual or allergic reaction to goserelin, hormone therapy, other medicines, foods, dyes, or preservatives -pregnant or trying to get pregnant -breast-feeding How should I use this medicine? This medicine is for injection under the skin. It is given by a health care professional in a hospital or clinic setting. Men receive this injection once every 4 weeks or once every 12 weeks. Women will only receive the once every 4 weeks injection. Talk to your pediatrician regarding the use of this medicine in children. Special care may be needed. Overdosage: If you think you have taken too much of this medicine contact a poison control center or emergency room at once. NOTE: This medicine is only for you. Do not share this medicine with others. What if I miss a dose? It is important not to miss your dose. Call your doctor or health care professional if you are unable to keep an appointment. What may  interact with this medicine? -female hormones like estrogen -herbal or dietary supplements like black cohosh, chasteberry, or DHEA -female hormones like testosterone -prasterone This list may not describe all possible interactions. Give your health care provider a list of all the medicines, herbs, non-prescription drugs, or dietary supplements you use. Also tell them if you smoke, drink alcohol, or use illegal drugs. Some items may interact with your medicine. What should I watch for while using this medicine? Visit your doctor or health care professional for regular checks on your progress. Your symptoms may appear to get worse during the first weeks of this therapy. Tell your doctor or healthcare professional if your symptoms do not start to get better or if they get worse after this time. Your bones may get weaker if you take this medicine for a long time. If you smoke or frequently drink alcohol you may increase your risk of bone loss. A family history of osteoporosis, chronic use of drugs for seizures (convulsions), or corticosteroids can also increase your risk of bone loss. Talk to your doctor about how to keep your bones strong. This medicine should stop regular monthly menstration in women. Tell your doctor if you continue to menstrate. Women should not become pregnant while taking this medicine or for 12 weeks after stopping this medicine. Women should inform their doctor if they wish to become pregnant or think they might be pregnant. There is a potential for serious side effects to an unborn child. Talk to your health care professional or pharmacist for more information. Do not breast-feed an infant while taking this medicine. Men should   inform their doctors if they wish to father a child. This medicine may lower sperm counts. Talk to your health care professional or pharmacist for more information. What side effects may I notice from receiving this medicine? Side effects that you should  report to your doctor or health care professional as soon as possible: -allergic reactions like skin rash, itching or hives, swelling of the face, lips, or tongue -bone pain -breathing problems -changes in vision -chest pain -feeling faint or lightheaded, falls -fever, chills -pain, swelling, warmth in the leg -pain, tingling, numbness in the hands or feet -signs and symptoms of low blood pressure like dizziness; feeling faint or lightheaded, falls; unusually weak or tired -stomach pain -swelling of the ankles, feet, hands -trouble passing urine or change in the amount of urine -unusually high or low blood pressure -unusually weak or tired Side effects that usually do not require medical attention (report to your doctor or health care professional if they continue or are bothersome): -change in sex drive or performance -changes in breast size in both males and females -changes in emotions or moods -headache -hot flashes -irritation at site where injected -loss of appetite -skin problems like acne, dry skin -vaginal dryness This list may not describe all possible side effects. Call your doctor for medical advice about side effects. You may report side effects to FDA at 1-800-FDA-1088. Where should I keep my medicine? This drug is given in a hospital or clinic and will not be stored at home. NOTE: This sheet is a summary. It may not cover all possible information. If you have questions about this medicine, talk to your doctor, pharmacist, or health care provider.    2016, Elsevier/Gold Standard. (2013-05-14 11:10:35)  

## 2016-12-30 MED FILL — AMLODIPINE BESYLATE 5 MG TA: 5 | 30 days supply | Qty: 30 | Fill #2

## 2017-01-10 ENCOUNTER — Ambulatory Visit: Payer: 59

## 2017-01-12 ENCOUNTER — Other Ambulatory Visit: Payer: Self-pay | Admitting: Hematology and Oncology

## 2017-01-17 ENCOUNTER — Ambulatory Visit: Payer: 59

## 2017-01-18 ENCOUNTER — Ambulatory Visit (INDEPENDENT_AMBULATORY_CARE_PROVIDER_SITE_OTHER): Payer: 59 | Admitting: Internal Medicine

## 2017-01-18 ENCOUNTER — Encounter: Payer: Self-pay | Admitting: Internal Medicine

## 2017-01-18 VITALS — BP 150/86 | HR 79 | Temp 98.0°F | Resp 15 | Ht 64.0 in | Wt 168.6 lb

## 2017-01-18 DIAGNOSIS — R5383 Other fatigue: Secondary | ICD-10-CM | POA: Diagnosis not present

## 2017-01-18 DIAGNOSIS — H8149 Vertigo of central origin, unspecified ear: Secondary | ICD-10-CM

## 2017-01-18 DIAGNOSIS — H814 Vertigo of central origin: Secondary | ICD-10-CM

## 2017-01-18 DIAGNOSIS — I10 Essential (primary) hypertension: Secondary | ICD-10-CM

## 2017-01-18 DIAGNOSIS — R7301 Impaired fasting glucose: Secondary | ICD-10-CM | POA: Diagnosis not present

## 2017-01-18 LAB — COMPREHENSIVE METABOLIC PANEL
ALBUMIN: 4.5 g/dL (ref 3.5–5.2)
ALT: 22 U/L (ref 0–35)
AST: 15 U/L (ref 0–37)
Alkaline Phosphatase: 102 U/L (ref 39–117)
BILIRUBIN TOTAL: 0.5 mg/dL (ref 0.2–1.2)
BUN: 17 mg/dL (ref 6–23)
CALCIUM: 10.1 mg/dL (ref 8.4–10.5)
CHLORIDE: 102 meq/L (ref 96–112)
CO2: 29 meq/L (ref 19–32)
CREATININE: 0.77 mg/dL (ref 0.40–1.20)
GFR: 82.68 mL/min (ref >60.00)
Glucose, Bld: 128 mg/dL — ABNORMAL HIGH (ref 70–99)
Potassium: 3.9 mEq/L (ref 3.5–5.1)
SODIUM: 139 meq/L (ref 135–145)
Total Protein: 7.8 g/dL (ref 6.0–8.3)

## 2017-01-18 LAB — HEMOGLOBIN A1C: HEMOGLOBIN A1C: 6 % (ref 4.6–6.5)

## 2017-01-18 LAB — CBC WITH DIFFERENTIAL/PLATELET
BASOS PCT: 0.7 % (ref 0.0–3.0)
Basophils Absolute: 0.1 10*3/uL (ref 0.0–0.1)
EOS ABS: 0.1 10*3/uL (ref 0.0–0.7)
EOS PCT: 1.4 % (ref 0.0–5.0)
HCT: 45.6 % (ref 36.0–46.0)
HEMOGLOBIN: 15 g/dL (ref 12.0–15.0)
LYMPHS ABS: 1.8 10*3/uL (ref 0.7–4.0)
Lymphocytes Relative: 24.1 % (ref 12.0–46.0)
MCHC: 32.9 g/dL (ref 30.0–36.0)
MCV: 97 fl (ref 78.0–100.0)
MONO ABS: 0.5 10*3/uL (ref 0.1–1.0)
Monocytes Relative: 7.2 % (ref 3.0–12.0)
NEUTROS PCT: 66.6 % (ref 43.0–77.0)
Neutro Abs: 4.9 10*3/uL (ref 1.4–7.7)
PLATELETS: 264 10*3/uL (ref 150.0–400.0)
RBC: 4.7 Mil/uL (ref 3.87–5.11)
RDW: 12.6 % (ref 11.5–15.5)
WBC: 7.3 10*3/uL (ref 4.0–10.5)

## 2017-01-18 LAB — MICROALBUMIN / CREATININE URINE RATIO
Creatinine,U: 43.1 mg/dL
Microalb Creat Ratio: 2.4 mg/g (ref 0.0–30.0)
Microalb, Ur: 1 mg/dL (ref 0.0–1.9)

## 2017-01-18 LAB — TSH: TSH: 2.68 u[IU]/mL (ref 0.35–4.50)

## 2017-01-18 MED ORDER — MECLIZINE HCL 25 MG PO TABS: 25.0000 mg | ORAL_TABLET | Freq: Three times a day (TID) | ORAL | 0 refills | Status: DC | PRN

## 2017-01-18 MED FILL — MECLIZINE 25 MG TABLET: 25 | 10 days supply | Qty: 30 | Fill #0

## 2017-01-18 NOTE — Progress Notes (Signed)
**Note Mary-Identified via Obfuscation** Subjective:  Patient ID: Mary [name], female    DOB: 05-Mar-1961  Age: 56 y.o. MRN: [id number]  CC: There were no encounter diagnoses.  HPI Mary [name] presents for EVALUATION  Of vertigo .  She has a history of breast cancer and hypertension and reports that she developed a disabling episode that occurred last Thursday, when she stood up suddenly  at work.  She was unable to walk for several minutes due to the symptoms.  bp was measured at time and was 180/110. Since then readings have been checked daily and range from 703 systolic to 500 .  Has been taking 5 mg amlodipine daily since diagnosis.  Last seen  2 year ago.  Has been having subjective feeling of positional dizziness for the past 6 months ,  Always occurs immediately after standing up ,  But Not with change from lying to sitting. Doesn't happen in middle of night  during voiding. Not occurring daily.   Also reports an a persistent Odd sensation of a swooshing sound in her head.  No daily headache,  Just occasional mild  Pain described as pressure in her ears and in her head.  She has also felt more tired than normal lately, but waling 30 minutes daily without chest pain or shortness of breath .  Feels her hearing has become impaired  But passed the screening test today in office.   Elevated blood sugars for the past several week,  Uses husband's glucometer. No history of DM .  Some weight gain since last visit 2.5 year s ago   Stays hungry all the time, (not new)  No polyuria ,  Drinks at least 30 ounces  3 times daily. No alcohol no soft drinks    History of breast cancer , s/p right mastectomy,  takign goserelin and exemestane.     Outpatient Medications Prior to Visit  Medication Sig Dispense Refill  . ALPRAZolam (XANAX) 0.25 MG tablet TAKE ONE TABLET BY MOUTH TWICE DAILY AS NEEDED FOR ANXIETY 60 tablet 4  . amLODipine (NORVASC) 5 MG tablet TAKE 1 TABLET BY MOUTH DAILY. 30 tablet 4  . Ascorbic Acid (VITAMIN C) 100  MG tablet Take 100 mg by mouth daily.      . Calcium Carbonate-Vitamin D (CALTRATE 600+D PO) Take 1 tablet by mouth daily.    . cholecalciferol (VITAMIN D) 400 UNITS TABS Take 1,000 Units by mouth daily.     Marland Kitchen esomeprazole (NEXIUM) 40 MG capsule Take 40 mg by mouth daily before breakfast.    . exemestane (AROMASIN) 25 MG tablet TAKE 1 TABLET BY MOUTH ONCE DAILY AFTER BREAKFAST 90 tablet 3  . goserelin (ZOLADEX) 3.6 MG injection Inject 3.6 mg into the skin every 28 (twenty-eight) days.    Marland Kitchen loperamide (IMODIUM A-D) 2 MG tablet Take 2 mg by mouth 4 (four) times daily as needed for diarrhea or loose stools.     . Multiple Vitamin (MULTIVITAMIN) tablet Take 1 tablet by mouth daily.      Marland Kitchen sulfamethoxazole-trimethoprim (BACTRIM DS,SEPTRA DS) 800-160 MG tablet Take 1 tablet by mouth 2 (two) times daily. (Patient not taking: Reported on 01/18/2017) 6 tablet 0   No facility-administered medications prior to visit.     Review of Systems;  Patient denies headache, fevers, malaise, unintentional weight loss, skin rash, eye pain, sinus congestion and sinus pain, sore throat, dysphagia,  hemoptysis , cough, dyspnea, wheezing, chest pain, palpitations, orthopnea, edema, abdominal pain, nausea, melena, diarrhea, constipation, flank pain,  dysuria, hematuria, urinary  Frequency, nocturia, numbness, tingling, seizures,  Focal weakness, Loss of consciousness,  Tremor, insomnia, depression, anxiety, and suicidal ideation.      Objective:  BP (!) 150/86 (BP Location: Left Arm, Patient Position: Sitting, Cuff Size: Normal)   Pulse 79   Temp 98 F (36.7 C) (Oral)   Resp 15   Ht 5\' 4"  (1.626 m)   Wt 168 lb 9.6 oz (76.5 kg)   SpO2 98%   BMI 28.94 kg/m   BP Readings from Last 3 Encounters:  01/18/17 (!) 150/86  12/28/16 138/77  11/22/16 133/79    Wt Readings from Last 3 Encounters:  01/18/17 168 lb 9.6 oz (76.5 kg)  07/26/16 172 lb 8 oz (78.2 kg)  11/10/15 168 lb (76.2 kg)    General appearance:  alert, cooperative and appears stated age Ears: normal TM's and external ear canals both ears Throat: lips, mucosa, and tongue normal; teeth and gums normal Neck: no adenopathy, no carotid bruit, supple, symmetrical, trachea midline and thyroid not enlarged, symmetric, no tenderness/mass/nodules Back: symmetric, no curvature. ROM normal. No CVA tenderness. Lungs: clear to auscultation bilaterally Heart: regular rate and rhythm, S1, S2 normal, no murmur, click, rub or gallop Abdomen: soft, non-tender; bowel sounds normal; no masses,  no organomegaly Pulses: 2+ and symmetric Skin: Skin color, texture, turgor normal. No rashes or lesions Lymph nodes: Cervical, supraclavicular, and axillary nodes normal. Neuro:  awake and interactive with normal mood and affect. Higher cortical functions are normal. Speech is clear without word-finding difficulty or dysarthria. Extraocular movements are intact. Visual fields of both eyes are grossly intact. Sensation to light touch is grossly intact bilaterally of upper and lower extremities. Motor examination shows 4+/5 symmetric hand grip and upper extremity and 5/5 lower extremity strength. There is no pronation or drift. Gait is non-ataxic    No results found for: HGBA1C  Lab Results  Component Value Date   CREATININE 0.93 09/19/2014   CREATININE 0.9 04/29/2014   CREATININE 0.84 12/06/2013    Lab Results  Component Value Date   WBC 6.4 04/29/2014   HGB 14.8 04/29/2014   HCT 43.8 04/29/2014   PLT 206 04/29/2014   GLUCOSE 111 (H) 09/19/2014   CHOL 217 (H) 09/19/2014   TRIG 155.0 (H) 09/19/2014   HDL 63.00 09/19/2014   LDLDIRECT 140.6 01/18/2011   LDLCALC 123 (H) 09/19/2014   ALT 21 09/19/2014   AST 18 09/19/2014   NA 141 09/19/2014   K 3.7 09/19/2014   CL 103 09/19/2014   CREATININE 0.93 09/19/2014   BUN 16 09/19/2014   CO2 26 09/19/2014   TSH 2.59 09/19/2014    No results found.  Assessment & Plan:   Problem List Items Addressed This  Visit    None      I have discontinued Ms. Mccollister's sulfamethoxazole-trimethoprim. I am also having her maintain her vitamin C, multivitamin, cholecalciferol, loperamide, Calcium Carbonate-Vitamin D (CALTRATE 600+D PO), esomeprazole, ALPRAZolam, goserelin, exemestane, and amLODipine.  No orders of the defined types were placed in this encounter.   Medications Discontinued During This Encounter  Medication Reason  . sulfamethoxazole-trimethoprim (BACTRIM DS,SEPTRA DS) 800-160 MG tablet Patient has not taken in last 30 days    Follow-up: No Follow-up on file.   [name], MD

## 2017-01-18 NOTE — Patient Instructions (Addendum)
You can increase the amlodipine to 10 mg daily   For now   The labs may indicate a need to add lisinopril if there is protein in your urine

## 2017-01-19 ENCOUNTER — Encounter: Payer: Self-pay | Admitting: Internal Medicine

## 2017-01-19 DIAGNOSIS — H814 Vertigo of central origin: Secondary | ICD-10-CM | POA: Insufficient documentation

## 2017-01-19 NOTE — Assessment & Plan Note (Signed)
Not at goal on 5 mg amlodipine.  Renal function is normal and she has no proteinuria.  Increase dose to 10 mg daily.  Not tolerated, will resume 5 mg dose and add hydrochlorothiazide 25 mg daily.  Lab Results  Component Value Date   MICROALBUR 1.0 01/18/2017   Lab Results  Component Value Date   NA 139 01/18/2017   K 3.9 01/18/2017   CL 102 01/18/2017   CO2 29 01/18/2017   Lab Results  Component Value Date   CREATININE 0.77 01/18/2017

## 2017-01-19 NOTE — Assessment & Plan Note (Signed)
Symptoms have become recurrent over the last several months accompanied by an sensation that could represent aneurysm or tumor.  She is not orthostatic on exam today.  Given her history of breast cancer I have ordered an MRI of the brain with internal auditory canals occluded.

## 2017-01-25 ENCOUNTER — Ambulatory Visit (HOSPITAL_BASED_OUTPATIENT_CLINIC_OR_DEPARTMENT_OTHER): Payer: 59

## 2017-01-25 VITALS — BP 138/80 | HR 74 | Temp 97.4°F | Resp 16

## 2017-01-25 DIAGNOSIS — Z5111 Encounter for antineoplastic chemotherapy: Secondary | ICD-10-CM

## 2017-01-25 DIAGNOSIS — C50412 Malignant neoplasm of upper-outer quadrant of left female breast: Secondary | ICD-10-CM | POA: Diagnosis not present

## 2017-01-25 MED ORDER — GOSERELIN ACETATE 3.6 MG ~~LOC~~ IMPL
3.6000 mg | DRUG_IMPLANT | Freq: Once | SUBCUTANEOUS | Status: AC
  Administered 2017-01-25: 3.6 mg via SUBCUTANEOUS
  Filled 2017-01-25: qty 3.6

## 2017-01-25 NOTE — Patient Instructions (Signed)
Goserelin injection What is this medicine? GOSERELIN (GOE se rel in) is similar to a hormone found in the body. It lowers the amount of sex hormones that the body makes. Men will have lower testosterone levels and women will have lower estrogen levels while taking this medicine. In men, this medicine is used to treat prostate cancer; the injection is either given once per month or once every 12 weeks. A once per month injection (only) is used to treat women with endometriosis, dysfunctional uterine bleeding, or advanced breast cancer. This medicine may be used for other purposes; ask your health care provider or pharmacist if you have questions. What should I tell my health care provider before I take this medicine? They need to know if you have any of these conditions (some only apply to women): -diabetes -heart disease or previous heart attack -high blood pressure -high cholesterol -kidney disease -osteoporosis or low bone density -problems passing urine -spinal cord injury -stroke -tobacco smoker -an unusual or allergic reaction to goserelin, hormone therapy, other medicines, foods, dyes, or preservatives -pregnant or trying to get pregnant -breast-feeding How should I use this medicine? This medicine is for injection under the skin. It is given by a health care professional in a hospital or clinic setting. Men receive this injection once every 4 weeks or once every 12 weeks. Women will only receive the once every 4 weeks injection. Talk to your pediatrician regarding the use of this medicine in children. Special care may be needed. Overdosage: If you think you have taken too much of this medicine contact a poison control center or emergency room at once. NOTE: This medicine is only for you. Do not share this medicine with others. What if I miss a dose? It is important not to miss your dose. Call your doctor or health care professional if you are unable to keep an appointment. What may  interact with this medicine? -female hormones like estrogen -herbal or dietary supplements like black cohosh, chasteberry, or DHEA -female hormones like testosterone -prasterone This list may not describe all possible interactions. Give your health care provider a list of all the medicines, herbs, non-prescription drugs, or dietary supplements you use. Also tell them if you smoke, drink alcohol, or use illegal drugs. Some items may interact with your medicine. What should I watch for while using this medicine? Visit your doctor or health care professional for regular checks on your progress. Your symptoms may appear to get worse during the first weeks of this therapy. Tell your doctor or healthcare professional if your symptoms do not start to get better or if they get worse after this time. Your bones may get weaker if you take this medicine for a long time. If you smoke or frequently drink alcohol you may increase your risk of bone loss. A family history of osteoporosis, chronic use of drugs for seizures (convulsions), or corticosteroids can also increase your risk of bone loss. Talk to your doctor about how to keep your bones strong. This medicine should stop regular monthly menstration in women. Tell your doctor if you continue to menstrate. Women should not become pregnant while taking this medicine or for 12 weeks after stopping this medicine. Women should inform their doctor if they wish to become pregnant or think they might be pregnant. There is a potential for serious side effects to an unborn child. Talk to your health care professional or pharmacist for more information. Do not breast-feed an infant while taking this medicine. Men should   inform their doctors if they wish to father a child. This medicine may lower sperm counts. Talk to your health care professional or pharmacist for more information. What side effects may I notice from receiving this medicine? Side effects that you should  report to your doctor or health care professional as soon as possible: -allergic reactions like skin rash, itching or hives, swelling of the face, lips, or tongue -bone pain -breathing problems -changes in vision -chest pain -feeling faint or lightheaded, falls -fever, chills -pain, swelling, warmth in the leg -pain, tingling, numbness in the hands or feet -signs and symptoms of low blood pressure like dizziness; feeling faint or lightheaded, falls; unusually weak or tired -stomach pain -swelling of the ankles, feet, hands -trouble passing urine or change in the amount of urine -unusually high or low blood pressure -unusually weak or tired Side effects that usually do not require medical attention (report to your doctor or health care professional if they continue or are bothersome): -change in sex drive or performance -changes in breast size in both males and females -changes in emotions or moods -headache -hot flashes -irritation at site where injected -loss of appetite -skin problems like acne, dry skin -vaginal dryness This list may not describe all possible side effects. Call your doctor for medical advice about side effects. You may report side effects to FDA at 1-800-FDA-1088. Where should I keep my medicine? This drug is given in a hospital or clinic and will not be stored at home. NOTE: This sheet is a summary. It may not cover all possible information. If you have questions about this medicine, talk to your doctor, pharmacist, or health care provider.    2016, Elsevier/Gold Standard. (2013-05-14 11:10:35)  

## 2017-01-30 ENCOUNTER — Ambulatory Visit
Admission: RE | Admit: 2017-01-30 | Discharge: 2017-01-30 | Disposition: A | Discharge status: Home / Self Care | Payer: 59 | Source: Ambulatory Visit | Attending: Internal Medicine | Admitting: Internal Medicine

## 2017-01-30 DIAGNOSIS — H8149 Vertigo of central origin, unspecified ear: Secondary | ICD-10-CM | POA: Diagnosis not present

## 2017-01-30 DIAGNOSIS — Q283 Other malformations of cerebral vessels: Secondary | ICD-10-CM | POA: Diagnosis not present

## 2017-01-30 DIAGNOSIS — R42 Dizziness and giddiness: Secondary | ICD-10-CM | POA: Diagnosis not present

## 2017-01-30 DIAGNOSIS — H814 Vertigo of central origin: Secondary | ICD-10-CM

## 2017-01-30 MED ORDER — GADOBENATE DIMEGLUMINE 529 MG/ML IV SOLN
15.0000 mL | Freq: Once | INTRAVENOUS | Status: AC | PRN
  Administered 2017-01-30: 15 mL via INTRAVENOUS

## 2017-01-31 ENCOUNTER — Encounter: Payer: Self-pay | Admitting: Internal Medicine

## 2017-02-01 MED FILL — AMLODIPINE BESYLATE 5 MG TA: 5 | 30 days supply | Qty: 30 | Fill #3

## 2017-02-01 MED FILL — EXEMESTANE 25 MG TABLET: 25 | 90 days supply | Qty: 90 | Fill #2

## 2017-02-14 ENCOUNTER — Ambulatory Visit: Payer: 59

## 2017-02-22 ENCOUNTER — Ambulatory Visit: Payer: 59

## 2017-02-23 ENCOUNTER — Ambulatory Visit (HOSPITAL_BASED_OUTPATIENT_CLINIC_OR_DEPARTMENT_OTHER): Payer: 59

## 2017-02-23 VITALS — BP 140/82 | HR 78 | Temp 98.0°F | Resp 16

## 2017-02-23 DIAGNOSIS — C779 Secondary and unspecified malignant neoplasm of lymph node, unspecified: Secondary | ICD-10-CM

## 2017-02-23 DIAGNOSIS — Z5111 Encounter for antineoplastic chemotherapy: Secondary | ICD-10-CM

## 2017-02-23 DIAGNOSIS — C50412 Malignant neoplasm of upper-outer quadrant of left female breast: Secondary | ICD-10-CM | POA: Diagnosis not present

## 2017-02-23 MED ORDER — GOSERELIN ACETATE 3.6 MG ~~LOC~~ IMPL
3.6000 mg | DRUG_IMPLANT | Freq: Once | SUBCUTANEOUS | Status: AC
  Administered 2017-02-23: 3.6 mg via SUBCUTANEOUS
  Filled 2017-02-23: qty 3.6

## 2017-03-03 MED FILL — AMLODIPINE BESYLATE 5 MG TA: 5 | 30 days supply | Qty: 30 | Fill #4

## 2017-03-15 ENCOUNTER — Ambulatory Visit: Payer: 59

## 2017-03-22 ENCOUNTER — Ambulatory Visit (HOSPITAL_BASED_OUTPATIENT_CLINIC_OR_DEPARTMENT_OTHER): Payer: 59

## 2017-03-22 VITALS — BP 155/83 | HR 80 | Temp 98.6°F | Resp 18

## 2017-03-22 DIAGNOSIS — Z5111 Encounter for antineoplastic chemotherapy: Secondary | ICD-10-CM | POA: Diagnosis not present

## 2017-03-22 DIAGNOSIS — C779 Secondary and unspecified malignant neoplasm of lymph node, unspecified: Secondary | ICD-10-CM | POA: Diagnosis not present

## 2017-03-22 DIAGNOSIS — C50412 Malignant neoplasm of upper-outer quadrant of left female breast: Secondary | ICD-10-CM

## 2017-03-22 MED ORDER — GOSERELIN ACETATE 3.6 MG ~~LOC~~ IMPL
3.6000 mg | DRUG_IMPLANT | Freq: Once | SUBCUTANEOUS | Status: AC
  Administered 2017-03-22: 3.6 mg via SUBCUTANEOUS
  Filled 2017-03-22: qty 3.6

## 2017-03-22 NOTE — Patient Instructions (Signed)
Goserelin injection What is this medicine? GOSERELIN (GOE se rel in) is similar to a hormone found in the body. It lowers the amount of sex hormones that the body makes. Men will have lower testosterone levels and women will have lower estrogen levels while taking this medicine. In men, this medicine is used to treat prostate cancer; the injection is either given once per month or once every 12 weeks. A once per month injection (only) is used to treat women with endometriosis, dysfunctional uterine bleeding, or advanced breast cancer. This medicine may be used for other purposes; ask your health care provider or pharmacist if you have questions. What should I tell my health care provider before I take this medicine? They need to know if you have any of these conditions (some only apply to women): -diabetes -heart disease or previous heart attack -high blood pressure -high cholesterol -kidney disease -osteoporosis or low bone density -problems passing urine -spinal cord injury -stroke -tobacco smoker -an unusual or allergic reaction to goserelin, hormone therapy, other medicines, foods, dyes, or preservatives -pregnant or trying to get pregnant -breast-feeding How should I use this medicine? This medicine is for injection under the skin. It is given by a health care professional in a hospital or clinic setting. Men receive this injection once every 4 weeks or once every 12 weeks. Women will only receive the once every 4 weeks injection. Talk to your pediatrician regarding the use of this medicine in children. Special care may be needed. Overdosage: If you think you have taken too much of this medicine contact a poison control center or emergency room at once. NOTE: This medicine is only for you. Do not share this medicine with others. What if I miss a dose? It is important not to miss your dose. Call your doctor or health care professional if you are unable to keep an appointment. What may  interact with this medicine? -female hormones like estrogen -herbal or dietary supplements like black cohosh, chasteberry, or DHEA -female hormones like testosterone -prasterone This list may not describe all possible interactions. Give your health care provider a list of all the medicines, herbs, non-prescription drugs, or dietary supplements you use. Also tell them if you smoke, drink alcohol, or use illegal drugs. Some items may interact with your medicine. What should I watch for while using this medicine? Visit your doctor or health care professional for regular checks on your progress. Your symptoms may appear to get worse during the first weeks of this therapy. Tell your doctor or healthcare professional if your symptoms do not start to get better or if they get worse after this time. Your bones may get weaker if you take this medicine for a long time. If you smoke or frequently drink alcohol you may increase your risk of bone loss. A family history of osteoporosis, chronic use of drugs for seizures (convulsions), or corticosteroids can also increase your risk of bone loss. Talk to your doctor about how to keep your bones strong. This medicine should stop regular monthly menstration in women. Tell your doctor if you continue to menstrate. Women should not become pregnant while taking this medicine or for 12 weeks after stopping this medicine. Women should inform their doctor if they wish to become pregnant or think they might be pregnant. There is a potential for serious side effects to an unborn child. Talk to your health care professional or pharmacist for more information. Do not breast-feed an infant while taking this medicine. Men should   inform their doctors if they wish to father a child. This medicine may lower sperm counts. Talk to your health care professional or pharmacist for more information. What side effects may I notice from receiving this medicine? Side effects that you should  report to your doctor or health care professional as soon as possible: -allergic reactions like skin rash, itching or hives, swelling of the face, lips, or tongue -bone pain -breathing problems -changes in vision -chest pain -feeling faint or lightheaded, falls -fever, chills -pain, swelling, warmth in the leg -pain, tingling, numbness in the hands or feet -signs and symptoms of low blood pressure like dizziness; feeling faint or lightheaded, falls; unusually weak or tired -stomach pain -swelling of the ankles, feet, hands -trouble passing urine or change in the amount of urine -unusually high or low blood pressure -unusually weak or tired Side effects that usually do not require medical attention (report to your doctor or health care professional if they continue or are bothersome): -change in sex drive or performance -changes in breast size in both males and females -changes in emotions or moods -headache -hot flashes -irritation at site where injected -loss of appetite -skin problems like acne, dry skin -vaginal dryness This list may not describe all possible side effects. Call your doctor for medical advice about side effects. You may report side effects to FDA at 1-800-FDA-1088. Where should I keep my medicine? This drug is given in a hospital or clinic and will not be stored at home. NOTE: This sheet is a summary. It may not cover all possible information. If you have questions about this medicine, talk to your doctor, pharmacist, or health care provider.    2016, Elsevier/Gold Standard. (2013-05-14 11:10:35)  

## 2017-04-03 ENCOUNTER — Other Ambulatory Visit: Payer: Self-pay | Admitting: Internal Medicine

## 2017-04-03 MED FILL — AMLODIPINE BESYLATE 5 MG TA: 5 | 30 days supply | Qty: 30 | Fill #0

## 2017-04-19 ENCOUNTER — Inpatient Hospital Stay: Payer: 59 | Attending: Hematology and Oncology

## 2017-04-19 VITALS — BP 151/88 | HR 82 | Temp 97.8°F | Resp 20

## 2017-04-19 DIAGNOSIS — C50412 Malignant neoplasm of upper-outer quadrant of left female breast: Secondary | ICD-10-CM | POA: Insufficient documentation

## 2017-04-19 DIAGNOSIS — C779 Secondary and unspecified malignant neoplasm of lymph node, unspecified: Secondary | ICD-10-CM | POA: Diagnosis not present

## 2017-04-19 DIAGNOSIS — Z79818 Long term (current) use of other agents affecting estrogen receptors and estrogen levels: Secondary | ICD-10-CM | POA: Diagnosis not present

## 2017-04-19 MED ORDER — GOSERELIN ACETATE 3.6 MG ~~LOC~~ IMPL
3.6000 mg | DRUG_IMPLANT | Freq: Once | SUBCUTANEOUS | Status: AC
  Administered 2017-04-19: 3.6 mg via SUBCUTANEOUS

## 2017-04-19 NOTE — Patient Instructions (Signed)
Goserelin injection What is this medicine? GOSERELIN (GOE se rel in) is similar to a hormone found in the body. It lowers the amount of sex hormones that the body makes. Men will have lower testosterone levels and women will have lower estrogen levels while taking this medicine. In men, this medicine is used to treat prostate cancer; the injection is either given once per month or once every 12 weeks. A once per month injection (only) is used to treat women with endometriosis, dysfunctional uterine bleeding, or advanced breast cancer. This medicine may be used for other purposes; ask your health care provider or pharmacist if you have questions. What should I tell my health care provider before I take this medicine? They need to know if you have any of these conditions (some only apply to women): -diabetes -heart disease or previous heart attack -high blood pressure -high cholesterol -kidney disease -osteoporosis or low bone density -problems passing urine -spinal cord injury -stroke -tobacco smoker -an unusual or allergic reaction to goserelin, hormone therapy, other medicines, foods, dyes, or preservatives -pregnant or trying to get pregnant -breast-feeding How should I use this medicine? This medicine is for injection under the skin. It is given by a health care professional in a hospital or clinic setting. Men receive this injection once every 4 weeks or once every 12 weeks. Women will only receive the once every 4 weeks injection. Talk to your pediatrician regarding the use of this medicine in children. Special care may be needed. Overdosage: If you think you have taken too much of this medicine contact a poison control center or emergency room at once. NOTE: This medicine is only for you. Do not share this medicine with others. What if I miss a dose? It is important not to miss your dose. Call your doctor or health care professional if you are unable to keep an appointment. What may  interact with this medicine? -female hormones like estrogen -herbal or dietary supplements like black cohosh, chasteberry, or DHEA -female hormones like testosterone -prasterone This list may not describe all possible interactions. Give your health care provider a list of all the medicines, herbs, non-prescription drugs, or dietary supplements you use. Also tell them if you smoke, drink alcohol, or use illegal drugs. Some items may interact with your medicine. What should I watch for while using this medicine? Visit your doctor or health care professional for regular checks on your progress. Your symptoms may appear to get worse during the first weeks of this therapy. Tell your doctor or healthcare professional if your symptoms do not start to get better or if they get worse after this time. Your bones may get weaker if you take this medicine for a long time. If you smoke or frequently drink alcohol you may increase your risk of bone loss. A family history of osteoporosis, chronic use of drugs for seizures (convulsions), or corticosteroids can also increase your risk of bone loss. Talk to your doctor about how to keep your bones strong. This medicine should stop regular monthly menstration in women. Tell your doctor if you continue to menstrate. Women should not become pregnant while taking this medicine or for 12 weeks after stopping this medicine. Women should inform their doctor if they wish to become pregnant or think they might be pregnant. There is a potential for serious side effects to an unborn child. Talk to your health care professional or pharmacist for more information. Do not breast-feed an infant while taking this medicine. Men should   inform their doctors if they wish to father a child. This medicine may lower sperm counts. Talk to your health care professional or pharmacist for more information. What side effects may I notice from receiving this medicine? Side effects that you should  report to your doctor or health care professional as soon as possible: -allergic reactions like skin rash, itching or hives, swelling of the face, lips, or tongue -bone pain -breathing problems -changes in vision -chest pain -feeling faint or lightheaded, falls -fever, chills -pain, swelling, warmth in the leg -pain, tingling, numbness in the hands or feet -signs and symptoms of low blood pressure like dizziness; feeling faint or lightheaded, falls; unusually weak or tired -stomach pain -swelling of the ankles, feet, hands -trouble passing urine or change in the amount of urine -unusually high or low blood pressure -unusually weak or tired Side effects that usually do not require medical attention (report to your doctor or health care professional if they continue or are bothersome): -change in sex drive or performance -changes in breast size in both males and females -changes in emotions or moods -headache -hot flashes -irritation at site where injected -loss of appetite -skin problems like acne, dry skin -vaginal dryness This list may not describe all possible side effects. Call your doctor for medical advice about side effects. You may report side effects to FDA at 1-800-FDA-1088. Where should I keep my medicine? This drug is given in a hospital or clinic and will not be stored at home. NOTE: This sheet is a summary. It may not cover all possible information. If you have questions about this medicine, talk to your doctor, pharmacist, or health care provider.    2016, Elsevier/Gold Standard. (2013-05-14 11:10:35)  

## 2017-05-03 MED FILL — EXEMESTANE 25 MG TABLET: 25 | 90 days supply | Qty: 90 | Fill #3

## 2017-05-03 MED FILL — AMLODIPINE BESYLATE 5 MG TA: 5 | 30 days supply | Qty: 30 | Fill #1

## 2017-05-17 ENCOUNTER — Inpatient Hospital Stay: Payer: 59 | Attending: Hematology and Oncology

## 2017-05-17 ENCOUNTER — Telehealth: Payer: Self-pay | Admitting: Hematology and Oncology

## 2017-05-17 VITALS — HR 73 | Temp 98.3°F | Resp 18

## 2017-05-17 DIAGNOSIS — C50412 Malignant neoplasm of upper-outer quadrant of left female breast: Secondary | ICD-10-CM | POA: Diagnosis not present

## 2017-05-17 DIAGNOSIS — C779 Secondary and unspecified malignant neoplasm of lymph node, unspecified: Secondary | ICD-10-CM | POA: Diagnosis not present

## 2017-05-17 DIAGNOSIS — Z5111 Encounter for antineoplastic chemotherapy: Secondary | ICD-10-CM | POA: Diagnosis not present

## 2017-05-17 MED ORDER — GOSERELIN ACETATE 3.6 MG ~~LOC~~ IMPL
3.6000 mg | DRUG_IMPLANT | Freq: Once | SUBCUTANEOUS | Status: AC
  Administered 2017-05-17: 3.6 mg via SUBCUTANEOUS

## 2017-05-17 NOTE — Telephone Encounter (Signed)
Scheduled patient for more injection appointments and updated her may schedule per patient request.

## 2017-05-17 NOTE — Patient Instructions (Signed)
Goserelin injection What is this medicine? GOSERELIN (GOE se rel in) is similar to a hormone found in the body. It lowers the amount of sex hormones that the body makes. Men will have lower testosterone levels and women will have lower estrogen levels while taking this medicine. In men, this medicine is used to treat prostate cancer; the injection is either given once per month or once every 12 weeks. A once per month injection (only) is used to treat women with endometriosis, dysfunctional uterine bleeding, or advanced breast cancer. This medicine may be used for other purposes; ask your health care provider or pharmacist if you have questions. What should I tell my health care provider before I take this medicine? They need to know if you have any of these conditions (some only apply to women): -diabetes -heart disease or previous heart attack -high blood pressure -high cholesterol -kidney disease -osteoporosis or low bone density -problems passing urine -spinal cord injury -stroke -tobacco smoker -an unusual or allergic reaction to goserelin, hormone therapy, other medicines, foods, dyes, or preservatives -pregnant or trying to get pregnant -breast-feeding How should I use this medicine? This medicine is for injection under the skin. It is given by a health care professional in a hospital or clinic setting. Men receive this injection once every 4 weeks or once every 12 weeks. Women will only receive the once every 4 weeks injection. Talk to your pediatrician regarding the use of this medicine in children. Special care may be needed. Overdosage: If you think you have taken too much of this medicine contact a poison control center or emergency room at once. NOTE: This medicine is only for you. Do not share this medicine with others. What if I miss a dose? It is important not to miss your dose. Call your doctor or health care professional if you are unable to keep an appointment. What may  interact with this medicine? -female hormones like estrogen -herbal or dietary supplements like black cohosh, chasteberry, or DHEA -female hormones like testosterone -prasterone This list may not describe all possible interactions. Give your health care provider a list of all the medicines, herbs, non-prescription drugs, or dietary supplements you use. Also tell them if you smoke, drink alcohol, or use illegal drugs. Some items may interact with your medicine. What should I watch for while using this medicine? Visit your doctor or health care professional for regular checks on your progress. Your symptoms may appear to get worse during the first weeks of this therapy. Tell your doctor or healthcare professional if your symptoms do not start to get better or if they get worse after this time. Your bones may get weaker if you take this medicine for a long time. If you smoke or frequently drink alcohol you may increase your risk of bone loss. A family history of osteoporosis, chronic use of drugs for seizures (convulsions), or corticosteroids can also increase your risk of bone loss. Talk to your doctor about how to keep your bones strong. This medicine should stop regular monthly menstration in women. Tell your doctor if you continue to menstrate. Women should not become pregnant while taking this medicine or for 12 weeks after stopping this medicine. Women should inform their doctor if they wish to become pregnant or think they might be pregnant. There is a potential for serious side effects to an unborn child. Talk to your health care professional or pharmacist for more information. Do not breast-feed an infant while taking this medicine. Men should   inform their doctors if they wish to father a child. This medicine may lower sperm counts. Talk to your health care professional or pharmacist for more information. What side effects may I notice from receiving this medicine? Side effects that you should  report to your doctor or health care professional as soon as possible: -allergic reactions like skin rash, itching or hives, swelling of the face, lips, or tongue -bone pain -breathing problems -changes in vision -chest pain -feeling faint or lightheaded, falls -fever, chills -pain, swelling, warmth in the leg -pain, tingling, numbness in the hands or feet -signs and symptoms of low blood pressure like dizziness; feeling faint or lightheaded, falls; unusually weak or tired -stomach pain -swelling of the ankles, feet, hands -trouble passing urine or change in the amount of urine -unusually high or low blood pressure -unusually weak or tired Side effects that usually do not require medical attention (report to your doctor or health care professional if they continue or are bothersome): -change in sex drive or performance -changes in breast size in both males and females -changes in emotions or moods -headache -hot flashes -irritation at site where injected -loss of appetite -skin problems like acne, dry skin -vaginal dryness This list may not describe all possible side effects. Call your doctor for medical advice about side effects. You may report side effects to FDA at 1-800-FDA-1088. Where should I keep my medicine? This drug is given in a hospital or clinic and will not be stored at home. NOTE: This sheet is a summary. It may not cover all possible information. If you have questions about this medicine, talk to your doctor, pharmacist, or health care provider.    2016, Elsevier/Gold Standard. (2013-05-14 11:10:35)  

## 2017-06-06 MED FILL — AMLODIPINE BESYLATE 5 MG TA: 5 | 30 days supply | Qty: 30 | Fill #2

## 2017-06-14 ENCOUNTER — Inpatient Hospital Stay: Payer: 59 | Attending: Hematology and Oncology

## 2017-06-14 ENCOUNTER — Telehealth: Payer: Self-pay | Admitting: *Deleted

## 2017-06-14 VITALS — BP 159/89 | HR 75 | Temp 98.2°F | Resp 16

## 2017-06-14 DIAGNOSIS — Z170 Estrogen receptor positive status [ER+]: Secondary | ICD-10-CM | POA: Insufficient documentation

## 2017-06-14 DIAGNOSIS — C779 Secondary and unspecified malignant neoplasm of lymph node, unspecified: Secondary | ICD-10-CM | POA: Diagnosis not present

## 2017-06-14 DIAGNOSIS — C50412 Malignant neoplasm of upper-outer quadrant of left female breast: Secondary | ICD-10-CM | POA: Insufficient documentation

## 2017-06-14 DIAGNOSIS — Z79818 Long term (current) use of other agents affecting estrogen receptors and estrogen levels: Secondary | ICD-10-CM | POA: Insufficient documentation

## 2017-06-14 MED ORDER — GOSERELIN ACETATE 3.6 MG ~~LOC~~ IMPL
3.6000 mg | DRUG_IMPLANT | Freq: Once | SUBCUTANEOUS | Status: AC
  Administered 2017-06-14: 3.6 mg via SUBCUTANEOUS

## 2017-06-14 NOTE — Patient Instructions (Signed)
Goserelin injection What is this medicine? GOSERELIN (GOE se rel in) is similar to a hormone found in the body. It lowers the amount of sex hormones that the body makes. Men will have lower testosterone levels and women will have lower estrogen levels while taking this medicine. In men, this medicine is used to treat prostate cancer; the injection is either given once per month or once every 12 weeks. A once per month injection (only) is used to treat women with endometriosis, dysfunctional uterine bleeding, or advanced breast cancer. This medicine may be used for other purposes; ask your health care provider or pharmacist if you have questions. What should I tell my health care provider before I take this medicine? They need to know if you have any of these conditions (some only apply to women): -diabetes -heart disease or previous heart attack -high blood pressure -high cholesterol -kidney disease -osteoporosis or low bone density -problems passing urine -spinal cord injury -stroke -tobacco smoker -an unusual or allergic reaction to goserelin, hormone therapy, other medicines, foods, dyes, or preservatives -pregnant or trying to get pregnant -breast-feeding How should I use this medicine? This medicine is for injection under the skin. It is given by a health care professional in a hospital or clinic setting. Men receive this injection once every 4 weeks or once every 12 weeks. Women will only receive the once every 4 weeks injection. Talk to your pediatrician regarding the use of this medicine in children. Special care may be needed. Overdosage: If you think you have taken too much of this medicine contact a poison control center or emergency room at once. NOTE: This medicine is only for you. Do not share this medicine with others. What if I miss a dose? It is important not to miss your dose. Call your doctor or health care professional if you are unable to keep an appointment. What may  interact with this medicine? -female hormones like estrogen -herbal or dietary supplements like black cohosh, chasteberry, or DHEA -female hormones like testosterone -prasterone This list may not describe all possible interactions. Give your health care provider a list of all the medicines, herbs, non-prescription drugs, or dietary supplements you use. Also tell them if you smoke, drink alcohol, or use illegal drugs. Some items may interact with your medicine. What should I watch for while using this medicine? Visit your doctor or health care professional for regular checks on your progress. Your symptoms may appear to get worse during the first weeks of this therapy. Tell your doctor or healthcare professional if your symptoms do not start to get better or if they get worse after this time. Your bones may get weaker if you take this medicine for a long time. If you smoke or frequently drink alcohol you may increase your risk of bone loss. A family history of osteoporosis, chronic use of drugs for seizures (convulsions), or corticosteroids can also increase your risk of bone loss. Talk to your doctor about how to keep your bones strong. This medicine should stop regular monthly menstration in women. Tell your doctor if you continue to menstrate. Women should not become pregnant while taking this medicine or for 12 weeks after stopping this medicine. Women should inform their doctor if they wish to become pregnant or think they might be pregnant. There is a potential for serious side effects to an unborn child. Talk to your health care professional or pharmacist for more information. Do not breast-feed an infant while taking this medicine. Men should   inform their doctors if they wish to father a child. This medicine may lower sperm counts. Talk to your health care professional or pharmacist for more information. What side effects may I notice from receiving this medicine? Side effects that you should  report to your doctor or health care professional as soon as possible: -allergic reactions like skin rash, itching or hives, swelling of the face, lips, or tongue -bone pain -breathing problems -changes in vision -chest pain -feeling faint or lightheaded, falls -fever, chills -pain, swelling, warmth in the leg -pain, tingling, numbness in the hands or feet -signs and symptoms of low blood pressure like dizziness; feeling faint or lightheaded, falls; unusually weak or tired -stomach pain -swelling of the ankles, feet, hands -trouble passing urine or change in the amount of urine -unusually high or low blood pressure -unusually weak or tired Side effects that usually do not require medical attention (report to your doctor or health care professional if they continue or are bothersome): -change in sex drive or performance -changes in breast size in both males and females -changes in emotions or moods -headache -hot flashes -irritation at site where injected -loss of appetite -skin problems like acne, dry skin -vaginal dryness This list may not describe all possible side effects. Call your doctor for medical advice about side effects. You may report side effects to FDA at 1-800-FDA-1088. Where should I keep my medicine? This drug is given in a hospital or clinic and will not be stored at home. NOTE: This sheet is a summary. It may not cover all possible information. If you have questions about this medicine, talk to your doctor, pharmacist, or health care provider.    2016, Elsevier/Gold Standard. (2013-05-14 11:10:35)  

## 2017-06-14 NOTE — Telephone Encounter (Signed)
Returning patient's phone call regarding Zoladex injection she received today in the injection room. She stated,"there was a new guy in the injection room today with Jenny Reichmann. He gave me my Zoladex injection. I don't know why, but this shot hurts more this time than any other time. It's already bruised in that area. How do I know I received all of the drug or any of the drug? Can someone please call me back at [phone]."

## 2017-06-16 ENCOUNTER — Telehealth: Payer: Self-pay

## 2017-06-16 NOTE — Telephone Encounter (Signed)
Called patient to inquire about Zoladex injection site. Patient states the area is still ecchymotic; however, there is no discomfort. Patient voiced concern regarding possibility of medication not being administered. Reassured patient medication was indeed administered.

## 2017-07-04 MED FILL — AMLODIPINE BESYLATE 5 MG TA: 5 | 30 days supply | Qty: 30 | Fill #3

## 2017-07-12 ENCOUNTER — Inpatient Hospital Stay: Payer: 59 | Attending: Hematology and Oncology

## 2017-07-12 VITALS — BP 160/85 | HR 82 | Temp 98.3°F | Resp 18

## 2017-07-12 DIAGNOSIS — R42 Dizziness and giddiness: Secondary | ICD-10-CM | POA: Diagnosis not present

## 2017-07-12 DIAGNOSIS — C50412 Malignant neoplasm of upper-outer quadrant of left female breast: Secondary | ICD-10-CM | POA: Diagnosis not present

## 2017-07-12 DIAGNOSIS — R55 Syncope and collapse: Secondary | ICD-10-CM | POA: Diagnosis not present

## 2017-07-12 DIAGNOSIS — C779 Secondary and unspecified malignant neoplasm of lymph node, unspecified: Secondary | ICD-10-CM | POA: Insufficient documentation

## 2017-07-12 DIAGNOSIS — H8110 Benign paroxysmal vertigo, unspecified ear: Secondary | ICD-10-CM | POA: Diagnosis not present

## 2017-07-12 MED ORDER — GOSERELIN ACETATE 3.6 MG ~~LOC~~ IMPL
DRUG_IMPLANT | SUBCUTANEOUS | Status: AC
  Filled 2017-07-12: qty 3.6

## 2017-07-12 MED ORDER — GOSERELIN ACETATE 3.6 MG ~~LOC~~ IMPL
3.6000 mg | DRUG_IMPLANT | Freq: Once | SUBCUTANEOUS | Status: AC
  Administered 2017-07-12: 3.6 mg via SUBCUTANEOUS

## 2017-07-12 NOTE — Patient Instructions (Signed)
Goserelin injection What is this medicine? GOSERELIN (GOE se rel in) is similar to a hormone found in the body. It lowers the amount of sex hormones that the body makes. Men will have lower testosterone levels and women will have lower estrogen levels while taking this medicine. In men, this medicine is used to treat prostate cancer; the injection is either given once per month or once every 12 weeks. A once per month injection (only) is used to treat women with endometriosis, dysfunctional uterine bleeding, or advanced breast cancer. This medicine may be used for other purposes; ask your health care provider or pharmacist if you have questions. What should I tell my health care provider before I take this medicine? They need to know if you have any of these conditions (some only apply to women): -diabetes -heart disease or previous heart attack -high blood pressure -high cholesterol -kidney disease -osteoporosis or low bone density -problems passing urine -spinal cord injury -stroke -tobacco smoker -an unusual or allergic reaction to goserelin, hormone therapy, other medicines, foods, dyes, or preservatives -pregnant or trying to get pregnant -breast-feeding How should I use this medicine? This medicine is for injection under the skin. It is given by a health care professional in a hospital or clinic setting. Men receive this injection once every 4 weeks or once every 12 weeks. Women will only receive the once every 4 weeks injection. Talk to your pediatrician regarding the use of this medicine in children. Special care may be needed. Overdosage: If you think you have taken too much of this medicine contact a poison control center or emergency room at once. NOTE: This medicine is only for you. Do not share this medicine with others. What if I miss a dose? It is important not to miss your dose. Call your doctor or health care professional if you are unable to keep an appointment. What may  interact with this medicine? -female hormones like estrogen -herbal or dietary supplements like black cohosh, chasteberry, or DHEA -female hormones like testosterone -prasterone This list may not describe all possible interactions. Give your health care provider a list of all the medicines, herbs, non-prescription drugs, or dietary supplements you use. Also tell them if you smoke, drink alcohol, or use illegal drugs. Some items may interact with your medicine. What should I watch for while using this medicine? Visit your doctor or health care professional for regular checks on your progress. Your symptoms may appear to get worse during the first weeks of this therapy. Tell your doctor or healthcare professional if your symptoms do not start to get better or if they get worse after this time. Your bones may get weaker if you take this medicine for a long time. If you smoke or frequently drink alcohol you may increase your risk of bone loss. A family history of osteoporosis, chronic use of drugs for seizures (convulsions), or corticosteroids can also increase your risk of bone loss. Talk to your doctor about how to keep your bones strong. This medicine should stop regular monthly menstration in women. Tell your doctor if you continue to menstrate. Women should not become pregnant while taking this medicine or for 12 weeks after stopping this medicine. Women should inform their doctor if they wish to become pregnant or think they might be pregnant. There is a potential for serious side effects to an unborn child. Talk to your health care professional or pharmacist for more information. Do not breast-feed an infant while taking this medicine. Men should   inform their doctors if they wish to father a child. This medicine may lower sperm counts. Talk to your health care professional or pharmacist for more information. What side effects may I notice from receiving this medicine? Side effects that you should  report to your doctor or health care professional as soon as possible: -allergic reactions like skin rash, itching or hives, swelling of the face, lips, or tongue -bone pain -breathing problems -changes in vision -chest pain -feeling faint or lightheaded, falls -fever, chills -pain, swelling, warmth in the leg -pain, tingling, numbness in the hands or feet -signs and symptoms of low blood pressure like dizziness; feeling faint or lightheaded, falls; unusually weak or tired -stomach pain -swelling of the ankles, feet, hands -trouble passing urine or change in the amount of urine -unusually high or low blood pressure -unusually weak or tired Side effects that usually do not require medical attention (report to your doctor or health care professional if they continue or are bothersome): -change in sex drive or performance -changes in breast size in both males and females -changes in emotions or moods -headache -hot flashes -irritation at site where injected -loss of appetite -skin problems like acne, dry skin -vaginal dryness This list may not describe all possible side effects. Call your doctor for medical advice about side effects. You may report side effects to FDA at 1-800-FDA-1088. Where should I keep my medicine? This drug is given in a hospital or clinic and will not be stored at home. NOTE: This sheet is a summary. It may not cover all possible information. If you have questions about this medicine, talk to your doctor, pharmacist, or health care provider.    2016, Elsevier/Gold Standard. (2013-05-14 11:10:35)  

## 2017-07-13 ENCOUNTER — Ambulatory Visit: Payer: 59 | Admitting: Family Medicine

## 2017-07-18 DIAGNOSIS — R42 Dizziness and giddiness: Secondary | ICD-10-CM | POA: Diagnosis not present

## 2017-07-18 DIAGNOSIS — R51 Headache: Secondary | ICD-10-CM | POA: Diagnosis not present

## 2017-07-19 ENCOUNTER — Encounter: Payer: Self-pay | Admitting: Internal Medicine

## 2017-07-19 DIAGNOSIS — R7301 Impaired fasting glucose: Secondary | ICD-10-CM

## 2017-07-19 DIAGNOSIS — E559 Vitamin D deficiency, unspecified: Secondary | ICD-10-CM

## 2017-07-19 DIAGNOSIS — I10 Essential (primary) hypertension: Secondary | ICD-10-CM

## 2017-07-25 DIAGNOSIS — R42 Dizziness and giddiness: Secondary | ICD-10-CM | POA: Diagnosis not present

## 2017-07-25 DIAGNOSIS — H6123 Impacted cerumen, bilateral: Secondary | ICD-10-CM | POA: Diagnosis not present

## 2017-07-27 ENCOUNTER — Ambulatory Visit: Payer: 59 | Admitting: Hematology and Oncology

## 2017-08-03 ENCOUNTER — Other Ambulatory Visit (INDEPENDENT_AMBULATORY_CARE_PROVIDER_SITE_OTHER): Payer: 59

## 2017-08-03 DIAGNOSIS — R7301 Impaired fasting glucose: Secondary | ICD-10-CM

## 2017-08-03 DIAGNOSIS — I10 Essential (primary) hypertension: Secondary | ICD-10-CM | POA: Diagnosis not present

## 2017-08-03 DIAGNOSIS — E559 Vitamin D deficiency, unspecified: Secondary | ICD-10-CM | POA: Diagnosis not present

## 2017-08-03 LAB — HEMOGLOBIN A1C: Hgb A1c MFr Bld: 6.2 % (ref 4.6–6.5)

## 2017-08-03 LAB — LIPID PANEL
CHOL/HDL RATIO: 4
CHOLESTEROL: 237 mg/dL — AB (ref 0–200)
HDL: 57.6 mg/dL (ref >39.00)
LDL Cholesterol: 151 mg/dL — ABNORMAL HIGH (ref 0–99)
NonHDL: 179.01
TRIGLYCERIDES: 141 mg/dL (ref 0.0–149.0)
VLDL: 28.2 mg/dL (ref 0.0–40.0)

## 2017-08-03 LAB — VITAMIN D 25 HYDROXY (VIT D DEFICIENCY, FRACTURES): VITD: 35.8 ng/mL (ref 30.00–100.00)

## 2017-08-03 LAB — COMPREHENSIVE METABOLIC PANEL
ALT: 19 U/L (ref 0–35)
AST: 16 U/L (ref 0–37)
Albumin: 4.3 g/dL (ref 3.5–5.2)
Alkaline Phosphatase: 91 U/L (ref 39–117)
BILIRUBIN TOTAL: 0.5 mg/dL (ref 0.2–1.2)
BUN: 13 mg/dL (ref 6–23)
CALCIUM: 9.7 mg/dL (ref 8.4–10.5)
CHLORIDE: 102 meq/L (ref 96–112)
CO2: 28 meq/L (ref 19–32)
Creatinine, Ser: 0.83 mg/dL (ref 0.40–1.20)
GFR: 75.67 mL/min (ref >60.00)
Glucose, Bld: 121 mg/dL — ABNORMAL HIGH (ref 70–99)
Potassium: 3.7 mEq/L (ref 3.5–5.1)
Sodium: 140 mEq/L (ref 135–145)
Total Protein: 7.6 g/dL (ref 6.0–8.3)

## 2017-08-03 LAB — MICROALBUMIN / CREATININE URINE RATIO
Creatinine,U: 353.2 mg/dL
MICROALB UR: 2.8 mg/dL — AB (ref 0.0–1.9)
Microalb Creat Ratio: 0.8 mg/g (ref 0.0–30.0)

## 2017-08-04 DIAGNOSIS — R42 Dizziness and giddiness: Secondary | ICD-10-CM | POA: Diagnosis not present

## 2017-08-10 ENCOUNTER — Inpatient Hospital Stay: Payer: 59

## 2017-08-10 ENCOUNTER — Inpatient Hospital Stay: Payer: 59 | Attending: Hematology and Oncology | Admitting: Hematology and Oncology

## 2017-08-10 ENCOUNTER — Ambulatory Visit (HOSPITAL_COMMUNITY)
Admission: RE | Admit: 2017-08-10 | Discharge: 2017-08-10 | Disposition: A | Discharge status: Home / Self Care | Payer: 59 | Source: Ambulatory Visit | Attending: Hematology and Oncology | Admitting: Hematology and Oncology

## 2017-08-10 ENCOUNTER — Telehealth: Payer: Self-pay

## 2017-08-10 DIAGNOSIS — Z9013 Acquired absence of bilateral breasts and nipples: Secondary | ICD-10-CM | POA: Diagnosis not present

## 2017-08-10 DIAGNOSIS — Z923 Personal history of irradiation: Secondary | ICD-10-CM | POA: Insufficient documentation

## 2017-08-10 DIAGNOSIS — H81399 Other peripheral vertigo, unspecified ear: Secondary | ICD-10-CM | POA: Insufficient documentation

## 2017-08-10 DIAGNOSIS — C50412 Malignant neoplasm of upper-outer quadrant of left female breast: Secondary | ICD-10-CM | POA: Insufficient documentation

## 2017-08-10 DIAGNOSIS — H8143 Vertigo of central origin, bilateral: Secondary | ICD-10-CM | POA: Diagnosis not present

## 2017-08-10 DIAGNOSIS — G44011 Episodic cluster headache, intractable: Secondary | ICD-10-CM

## 2017-08-10 DIAGNOSIS — Q283 Other malformations of cerebral vessels: Secondary | ICD-10-CM | POA: Diagnosis not present

## 2017-08-10 DIAGNOSIS — Z79811 Long term (current) use of aromatase inhibitors: Secondary | ICD-10-CM | POA: Insufficient documentation

## 2017-08-10 DIAGNOSIS — C779 Secondary and unspecified malignant neoplasm of lymph node, unspecified: Secondary | ICD-10-CM | POA: Diagnosis not present

## 2017-08-10 DIAGNOSIS — G44029 Chronic cluster headache, not intractable: Secondary | ICD-10-CM | POA: Diagnosis not present

## 2017-08-10 DIAGNOSIS — Z9221 Personal history of antineoplastic chemotherapy: Secondary | ICD-10-CM | POA: Diagnosis not present

## 2017-08-10 DIAGNOSIS — G319 Degenerative disease of nervous system, unspecified: Secondary | ICD-10-CM | POA: Insufficient documentation

## 2017-08-10 DIAGNOSIS — R42 Dizziness and giddiness: Secondary | ICD-10-CM | POA: Diagnosis not present

## 2017-08-10 DIAGNOSIS — IMO0001 Reserved for inherently not codable concepts without codable children: Secondary | ICD-10-CM

## 2017-08-10 MED ORDER — GOSERELIN ACETATE 3.6 MG ~~LOC~~ IMPL
DRUG_IMPLANT | SUBCUTANEOUS | Status: AC
Start: 2017-08-10 — End: 2017-08-10
  Filled 2017-08-10: qty 3.6

## 2017-08-10 MED ORDER — GADOBENATE DIMEGLUMINE 529 MG/ML IV SOLN
15.0000 mL | Freq: Once | INTRAVENOUS | Status: AC | PRN
  Administered 2017-08-10: 15 mL via INTRAVENOUS

## 2017-08-10 MED ORDER — EXEMESTANE 25 MG PO TABS: ORAL_TABLET | ORAL | 3 refills | Status: DC

## 2017-08-10 MED FILL — EXEMESTANE 25 MG TABLET: 25 | 90 days supply | Qty: 90 | Fill #0

## 2017-08-10 NOTE — Assessment & Plan Note (Signed)
Right breast invasive lobular carcinomaER 95%, PR 53 %, Ki 67 85%, HER-2 negative with lymphovascular invasion status post neoadjuvant chemotherapy followed by bilateral mastectomies 05/18/2012 showed invasive lobular cancer 2.3 cm, 8/17 lymph nodes positive T2 N2 M0 stage IIIa;  left breast showed DCIS Status post right breast radiation currently on tamoxifen with Zoladex monthly Started 08/19/2012 Switched to exemestane on Zoladex June 2015   Bone density showed T score -0.2 and -0.3.  Current treatment: 1. Zoladex with exemestane  Patient was not menopausal based on testing in April 2017, hence we continued Zoladex injections.  Breast cancer surveillance: 1. No role of imaging studies since she had bilateral mastectomies 2. Chest wall and axillary examination 07/26/2016 is normal  FSH and estradiol were not in the menopausal range done on 06/26/2015 ([place] 14.6, estradiol 15). I recommended continuation of Zoladex injections along with aromatase inhibitor therapy.  Return to clinic in 1 year

## 2017-08-10 NOTE — Telephone Encounter (Signed)
Dr Lindi Adie would like pt to have STAT MRI and MRA done - called scheduling [place] location only had 9 pm tonight.  [place] location has 2 pm, 1:30 arrival - Dr Lindi Adie and pt agreed.  Orders changed to reflect [place] location, Dr Lindi Adie signed requisitions and I faxed to [place] at central scheduling.  Pt preferred to drive herself.

## 2017-08-10 NOTE — Progress Notes (Signed)
Patient Care Team: Mary Mc, MD as PCP - General (Internal Medicine)  DIAGNOSIS:  Encounter Diagnoses  Name Primary?  . Primary cancer of upper outer quadrant of left female breast ([place])   . 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 Chronic cluster headache, not intractable   . Intractable episodic cluster headache     SUMMARY OF ONCOLOGIC HISTORY:   Primary cancer of upper outer quadrant of left female breast ([place])   11/17/2011 Initial Diagnosis    Cancer of upper-outer quadrant of female breast diagnosed when pt presented with Nipple flattening and palpable lesion.      12/08/2011 - 03/29/2012 Chemotherapy    Neo adjuvant FEC X 4 followed by Weekly Taxol X 12      05/18/2012 Surgery    Bilateral Mastectomies: Right revealed invasive  Lobular carcinoma measuring 2.3 cm 8 of 17 lymph nodes were positive (T2 N2A). 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      07/04/2012 - 08/17/2012 Radiation Therapy    Rt Breast irradiation      08/19/2012 -  Anti-estrogen oral therapy    Initially Tamoxifen + Zolodex and changed to Aromasin + Zolodex Monthly since 08/2013       CHIEF COMPLIANT: Follow-up on Zoladex along with Aromasin  INTERVAL HISTORY: Mary [name] is a 57 year old with above-mentioned history of left breast cancer who underwent neoadjuvant chemotherapy followed by bilateral mastectomies and radiation.  She is currently on oral antiestrogen therapy with exemestane along with Zoladex.  She has been on it for the past 5 years.  Patient is complaining of severe headaches, dizziness and vertigo.  This is been getting worse over the past 1 month.  Patient has seen ENT and they treated her for benign positional vertigo but it has not helped her.  She is swaying from side to side.  She has had severe headaches as well.  She also complains of neck stiffness and neck pain.  Denies any fevers or chills.  REVIEW OF SYSTEMS:     Constitutional: Denies fevers, chills or abnormal weight loss Eyes: Denies blurriness of vision Ears, nose, mouth, throat, and face: Denies mucositis or sore throat Respiratory: Denies cough, dyspnea or wheezes Cardiovascular: Denies palpitation, chest discomfort Gastrointestinal:  Denies nausea, heartburn or change in bowel habits Skin: Denies abnormal skin rashes Lymphatics: Denies new lymphadenopathy or easy bruising Neurological: Headaches, neck stiffness, Behavioral/Psych: Mood is stable, no new changes  Extremities: No lower extremity edema Breast: Bilateral mastectomies All other systems were reviewed with the patient and are negative.  I have reviewed the past medical history, past surgical history, social history and family history with the patient and they are unchanged from previous note.  ALLERGIES:  is allergic to adhesive [tape].  MEDICATIONS:  Current Outpatient Medications  Medication Sig Dispense Refill  . ALPRAZolam (XANAX) 0.25 MG tablet TAKE ONE TABLET BY MOUTH TWICE DAILY AS NEEDED FOR ANXIETY 60 tablet 4  . amLODipine (NORVASC) 5 MG tablet TAKE 1 TABLET BY MOUTH DAILY. 30 tablet 4  . Ascorbic Acid (VITAMIN C) 100 MG tablet Take 100 mg by mouth daily.      . Calcium Carbonate-Vitamin D (CALTRATE 600+D PO) Take 1 tablet by mouth daily.    . cholecalciferol (VITAMIN D) 400 UNITS TABS Take 1,000 Units by mouth daily.     Marland Kitchen esomeprazole (NEXIUM) 40 MG capsule Take 40 mg by mouth daily before breakfast.    .  exemestane (AROMASIN) 25 MG tablet TAKE 1 TABLET BY MOUTH ONCE DAILY AFTER BREAKFAST 90 tablet 3  . goserelin (ZOLADEX) 3.6 MG injection Inject 3.6 mg into the skin every 28 (twenty-eight) days.    Marland Kitchen loperamide (IMODIUM A-D) 2 MG tablet Take 2 mg by mouth 4 (four) times daily as needed for diarrhea or loose stools.     . meclizine (ANTIVERT) 25 MG tablet Take 1 tablet (25 mg total) by mouth 3 (three) times daily as needed for dizziness. 30 tablet 0  . Multiple  Vitamin (MULTIVITAMIN) tablet Take 1 tablet by mouth daily.       No current facility-administered medications for this visit.     PHYSICAL EXAMINATION: ECOG PERFORMANCE STATUS: 1 - Symptomatic but completely ambulatory  Vitals:   08/10/17 1115  BP: (!) 155/84  Pulse: 78  Resp: 16  Temp: 98 F (36.7 C)  SpO2: 100%   Filed Weights   08/10/17 1115  Weight: 162 lb 6.4 oz (73.7 kg)    GENERAL:alert, no distress and comfortable SKIN: skin color, texture, turgor are normal, no rashes or significant lesions EYES: normal, Conjunctiva are pink and non-injected, sclera clear OROPHARYNX:no exudate, no erythema and lips, buccal mucosa, and tongue normal  NECK: supple, thyroid normal size, non-tender, without nodularity LYMPH:  no palpable lymphadenopathy in the cervical, axillary or inguinal LUNGS: clear to auscultation and percussion with normal breathing effort HEART: regular rate & rhythm and no murmurs and no lower extremity edema ABDOMEN:abdomen soft, non-tender and normal bowel sounds MUSCULOSKELETAL:no cyanosis of digits and no clubbing  NEURO: Dizziness with standing, no focal motor deficits, dizziness and blurred vision. EXTREMITIES: No lower extremity edema BREAST: Bilateral mastectomies (exam performed in the presence of a chaperone)  LABORATORY DATA:  I have reviewed the data as listed CMP Latest Ref Rng & Units 08/03/2017 01/18/2017 09/19/2014  Glucose 70 - 99 mg/dL 121(H) 128(H) 111(H)  BUN 6 - 23 mg/dL '13 17 16  '$ Creatinine 0.40 - 1.20 mg/dL 0.83 0.77 0.93  Sodium 135 - 145 mEq/L 140 139 141  Potassium 3.5 - 5.1 mEq/L 3.7 3.9 3.7  Chloride 96 - 112 mEq/L 102 102 103  CO2 19 - 32 mEq/L '28 29 26  '$ Calcium 8.4 - 10.5 mg/dL 9.7 10.1 9.7  Total Protein 6.0 - 8.3 g/dL 7.6 7.8 7.7  Total Bilirubin 0.2 - 1.2 mg/dL 0.5 0.5 0.6  Alkaline Phos 39 - 117 U/L 91 102 108  AST 0 - 37 U/L '16 15 18  '$ ALT 0 - 35 U/L '19 22 21    '$ Lab Results  Component Value Date   WBC 7.3 01/18/2017     HGB 15.0 01/18/2017   HCT 45.6 01/18/2017   MCV 97.0 01/18/2017   PLT 264.0 01/18/2017   NEUTROABS 4.9 01/18/2017    ASSESSMENT & PLAN:  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  Current treatment: 1. Zoladex with exemestane  Patient was not menopausal based on testing in April 2017, hence we continued Zoladex injections. Because the patient had multiple positive  lymph nodes, I recommended extended adjuvant therapy. She will continue with exemestane between 7 to 10 years.  Breast cancer surveillance: 1. No role of imaging studies since she had bilateral mastectomies 2. Chest wall and axillary examination 07/26/2016 is normal  FSH and estradiol were not in the menopausal range done on 06/26/2015 ([place] 14.6, estradiol 15). I recommended continuation of Zoladex injections along with aromatase inhibitor therapy.  Severe headaches, dizziness, vertigo: I will order an MRI and MRA of the brain stat to evaluate the symptoms.  I am concerned that she may have either metastatic disease or some other intracranial process going on.  I will call her with results of these tests.   Return to clinic in 1 year   Orders Placed This Encounter  Procedures  . MR Brain W Wo Contrast    Standing Status:   Future    Standing Expiration Date:   08/10/2018    Order Specific Question:   If indicated for the ordered procedure, I authorize the administration of contrast media per Radiology  protocol    Answer:   Yes    Order Specific Question:   What is the patient's sedation requirement?    Answer:   No Sedation    Order Specific Question:   Does the patient have a pacemaker or implanted devices?    Answer:   No    Order Specific Question:   Use SRS Protocol?    Answer:   No    Order Specific Question:   Radiology Contrast Protocol - do NOT remove file path    Answer:   [email or web address]    Order Specific Question:   Preferred imaging location?    Answer:   [facility] (table limit-350 lbs)  . MR MRA HEAD WO CONTRAST    Standing Status:   Future    Standing Expiration Date:   10/11/2018    Order Specific Question:   What is the patient's sedation requirement?    Answer:   No Sedation    Order Specific Question:   Does the patient have a pacemaker or implanted devices?    Answer:   No    Order Specific Question:   Preferred imaging location?    Answer:   [facility] (table limit-350 lbs)    Order Specific Question:   Radiology Contrast Protocol - do NOT remove file path    Answer:   [email or web address]    Order Specific Question:   Reason for Exam additional comments    Answer:   Headaches frontal intense with breast cancer history   The patient has a good understanding of the overall plan. she agrees with it. she will call with any problems that may develop before the next visit here.   Harriette Ohara, MD 08/10/17

## 2017-08-11 ENCOUNTER — Telehealth: Payer: Self-pay | Admitting: Hematology and Oncology

## 2017-08-11 ENCOUNTER — Inpatient Hospital Stay: Payer: 59

## 2017-08-11 VITALS — BP 150/88 | HR 69 | Temp 98.0°F | Resp 18

## 2017-08-11 DIAGNOSIS — C50412 Malignant neoplasm of upper-outer quadrant of left female breast: Secondary | ICD-10-CM

## 2017-08-11 DIAGNOSIS — Z79811 Long term (current) use of aromatase inhibitors: Secondary | ICD-10-CM | POA: Diagnosis not present

## 2017-08-11 DIAGNOSIS — C779 Secondary and unspecified malignant neoplasm of lymph node, unspecified: Secondary | ICD-10-CM | POA: Diagnosis not present

## 2017-08-11 DIAGNOSIS — Z923 Personal history of irradiation: Secondary | ICD-10-CM | POA: Diagnosis not present

## 2017-08-11 DIAGNOSIS — Z9013 Acquired absence of bilateral breasts and nipples: Secondary | ICD-10-CM | POA: Diagnosis not present

## 2017-08-11 DIAGNOSIS — Z9221 Personal history of antineoplastic chemotherapy: Secondary | ICD-10-CM | POA: Diagnosis not present

## 2017-08-11 MED ORDER — GOSERELIN ACETATE 3.6 MG ~~LOC~~ IMPL
DRUG_IMPLANT | SUBCUTANEOUS | Status: AC
  Filled 2017-08-11: qty 3.6

## 2017-08-11 MED ORDER — GOSERELIN ACETATE 3.6 MG ~~LOC~~ IMPL
3.6000 mg | DRUG_IMPLANT | Freq: Once | SUBCUTANEOUS | Status: AC
  Administered 2017-08-11: 3.6 mg via SUBCUTANEOUS

## 2017-08-11 NOTE — Patient Instructions (Signed)
Goserelin injection What is this medicine? GOSERELIN (GOE se rel in) is similar to a hormone found in the body. It lowers the amount of sex hormones that the body makes. Men will have lower testosterone levels and women will have lower estrogen levels while taking this medicine. In men, this medicine is used to treat prostate cancer; the injection is either given once per month or once every 12 weeks. A once per month injection (only) is used to treat women with endometriosis, dysfunctional uterine bleeding, or advanced breast cancer. This medicine may be used for other purposes; ask your health care provider or pharmacist if you have questions. What should I tell my health care provider before I take this medicine? They need to know if you have any of these conditions (some only apply to women): -diabetes -heart disease or previous heart attack -high blood pressure -high cholesterol -kidney disease -osteoporosis or low bone density -problems passing urine -spinal cord injury -stroke -tobacco smoker -an unusual or allergic reaction to goserelin, hormone therapy, other medicines, foods, dyes, or preservatives -pregnant or trying to get pregnant -breast-feeding How should I use this medicine? This medicine is for injection under the skin. It is given by a health care professional in a hospital or clinic setting. Men receive this injection once every 4 weeks or once every 12 weeks. Women will only receive the once every 4 weeks injection. Talk to your pediatrician regarding the use of this medicine in children. Special care may be needed. Overdosage: If you think you have taken too much of this medicine contact a poison control center or emergency room at once. NOTE: This medicine is only for you. Do not share this medicine with others. What if I miss a dose? It is important not to miss your dose. Call your doctor or health care professional if you are unable to keep an appointment. What may  interact with this medicine? -female hormones like estrogen -herbal or dietary supplements like black cohosh, chasteberry, or DHEA -female hormones like testosterone -prasterone This list may not describe all possible interactions. Give your health care provider a list of all the medicines, herbs, non-prescription drugs, or dietary supplements you use. Also tell them if you smoke, drink alcohol, or use illegal drugs. Some items may interact with your medicine. What should I watch for while using this medicine? Visit your doctor or health care professional for regular checks on your progress. Your symptoms may appear to get worse during the first weeks of this therapy. Tell your doctor or healthcare professional if your symptoms do not start to get better or if they get worse after this time. Your bones may get weaker if you take this medicine for a long time. If you smoke or frequently drink alcohol you may increase your risk of bone loss. A family history of osteoporosis, chronic use of drugs for seizures (convulsions), or corticosteroids can also increase your risk of bone loss. Talk to your doctor about how to keep your bones strong. This medicine should stop regular monthly menstration in women. Tell your doctor if you continue to menstrate. Women should not become pregnant while taking this medicine or for 12 weeks after stopping this medicine. Women should inform their doctor if they wish to become pregnant or think they might be pregnant. There is a potential for serious side effects to an unborn child. Talk to your health care professional or pharmacist for more information. Do not breast-feed an infant while taking this medicine. Men should   inform their doctors if they wish to father a child. This medicine may lower sperm counts. Talk to your health care professional or pharmacist for more information. What side effects may I notice from receiving this medicine? Side effects that you should  report to your doctor or health care professional as soon as possible: -allergic reactions like skin rash, itching or hives, swelling of the face, lips, or tongue -bone pain -breathing problems -changes in vision -chest pain -feeling faint or lightheaded, falls -fever, chills -pain, swelling, warmth in the leg -pain, tingling, numbness in the hands or feet -signs and symptoms of low blood pressure like dizziness; feeling faint or lightheaded, falls; unusually weak or tired -stomach pain -swelling of the ankles, feet, hands -trouble passing urine or change in the amount of urine -unusually high or low blood pressure -unusually weak or tired Side effects that usually do not require medical attention (report to your doctor or health care professional if they continue or are bothersome): -change in sex drive or performance -changes in breast size in both males and females -changes in emotions or moods -headache -hot flashes -irritation at site where injected -loss of appetite -skin problems like acne, dry skin -vaginal dryness This list may not describe all possible side effects. Call your doctor for medical advice about side effects. You may report side effects to FDA at 1-800-FDA-1088. Where should I keep my medicine? This drug is given in a hospital or clinic and will not be stored at home. NOTE: This sheet is a summary. It may not cover all possible information. If you have questions about this medicine, talk to your doctor, pharmacist, or health care provider.    2016, Elsevier/Gold Standard. (2013-05-14 11:10:35)  

## 2017-08-11 NOTE — Telephone Encounter (Signed)
Gave patient AVs and calendar of upcoming appointments.  °

## 2017-08-17 DIAGNOSIS — R42 Dizziness and giddiness: Secondary | ICD-10-CM | POA: Diagnosis not present

## 2017-08-21 MED FILL — AMLODIPINE BESYLATE 5 MG TA: 5 | 30 days supply | Qty: 30 | Fill #4

## 2017-08-24 DIAGNOSIS — R42 Dizziness and giddiness: Secondary | ICD-10-CM | POA: Diagnosis not present

## 2017-08-25 ENCOUNTER — Encounter: Payer: Self-pay | Admitting: Internal Medicine

## 2017-08-25 ENCOUNTER — Ambulatory Visit (INDEPENDENT_AMBULATORY_CARE_PROVIDER_SITE_OTHER): Payer: 59 | Admitting: Internal Medicine

## 2017-08-25 ENCOUNTER — Other Ambulatory Visit (HOSPITAL_COMMUNITY)
Admission: RE | Admit: 2017-08-25 | Discharge: 2017-08-25 | Disposition: A | Discharge status: Home / Self Care | Payer: 59 | Source: Ambulatory Visit | Attending: Internal Medicine | Admitting: Internal Medicine

## 2017-08-25 VITALS — BP 162/90 | HR 83 | Temp 98.3°F | Resp 15 | Ht 64.0 in | Wt 160.0 lb

## 2017-08-25 DIAGNOSIS — R7303 Prediabetes: Secondary | ICD-10-CM

## 2017-08-25 DIAGNOSIS — H8103 Meniere's disease, bilateral: Secondary | ICD-10-CM

## 2017-08-25 DIAGNOSIS — Z0001 Encounter for general adult medical examination with abnormal findings: Secondary | ICD-10-CM

## 2017-08-25 DIAGNOSIS — Z124 Encounter for screening for malignant neoplasm of cervix: Secondary | ICD-10-CM

## 2017-08-25 DIAGNOSIS — Z1151 Encounter for screening for human papillomavirus (HPV): Secondary | ICD-10-CM | POA: Insufficient documentation

## 2017-08-25 DIAGNOSIS — I10 Essential (primary) hypertension: Secondary | ICD-10-CM | POA: Diagnosis not present

## 2017-08-25 MED ORDER — DIAZEPAM 10 MG PO TABS: 10.0000 mg | ORAL_TABLET | Freq: Two times a day (BID) | ORAL | 1 refills | Status: DC | PRN

## 2017-08-25 MED ORDER — LOSARTAN POTASSIUM-HCTZ 50-12.5 MG PO TABS: 1.0000 | ORAL_TABLET | Freq: Every day | ORAL | 0 refills | Status: DC

## 2017-08-25 MED ORDER — PREDNISONE 10 MG PO TABS: ORAL_TABLET | ORAL | 0 refills | Status: DC

## 2017-08-25 NOTE — Progress Notes (Signed)
**Note Mary-Identified via Obfuscation** Patient ID: Mary [name], female    DOB: [birth year]  Age: 57 y.o. MRN: [id number]  The patient is here for annual  Preventive examination and management of other several chronic and acute problems.  Last seen Oct 2018   The risk factors are reflected in the social history.  The roster of all physicians providing medical care to patient - is listed in the Snapshot section of the chart.  Activities of daily living:  The patient is 100% independent in all ADLs: dressing, toileting, feeding as well as independent mobility  Home safety : The patient has smoke detectors in the home. They wear seatbelts.  There are no firearms at home. There is no violence in the home.   There is no risks for hepatitis, STDs or HIV. There is no   history of blood transfusion. They have no travel history to infectious disease endemic areas of the world.  The patient has seen their dentist in the last six month. They have seen their eye doctor in the last year. They deny hearing difficulty with regard to whispered voices and some television programs.  They have deferred audiologic testing in the last year.  They do not  have excessive sun exposure. Discussed the need for sun protection: hats, long sleeves and use of sunscreen if there is significant sun exposure.   Diet: the importance of a healthy diet is discussed. They do have a healthy diet.  The benefits of regular aerobic exercise were discussed. She walks 4 times per week ,  20 minutes.   Depression screen: there are no signs or vegative symptoms of depression- irritability, change in appetite, anhedonia, sadness/tearfullness.  Cognitive assessment: the patient manages all their financial and personal affairs and is actively engaged. They could relate day,date,year and events; recalled 2/3 objects at 3 minutes; performed clock-face test normally.  The following portions of the patient's history were reviewed and updated as appropriate: allergies, current  medications, past family history, past medical history,  past surgical history, past social history  and problem list.  Visual acuity was not assessed per patient preference since she has regular follow up with her ophthalmologist. Hearing and body mass index were assessed and reviewed.   During the course of the visit the patient was educated and counseled about appropriate screening and preventive services including : fall prevention , diabetes screening, nutrition counseling, colorectal cancer screening, and recommended immunizations.    CC: The primary encounter diagnosis was Encounter for general adult medical examination with abnormal findings. Diagnoses of Essential hypertension, Cervical cancer screening, Vertigo, labyrinthine, bilateral, and Prediabetes were also pertinent to this visit.  1) has been having severe headaches, dizziness and vertigo on a daily basis,  Started in  April ,  lasted a month with no improvement by ENT Manipulation  (sent to ENT by Mary [name], Neurology)  And after  PT .  MRI/MRA done:  Normal except for age advanced cerebral atrophy and partially empty sella noted.  Has missed 6.5 days of work.  NO treatment   Other than a 5 day prednisone taper in April prescribed by Mary [name] . Not sure if it helped.  No nausea/vomiting.  Symptoms are better compared to when it started .    Had a similar dizzy spell last year without the headaches.   2) anxiety state:  Worried about her health,  Frustrated and stressed out at work.  Wants to start medication.  3) Prediabetes: still walking daily at lunch but not  losing weight  .  Not following a low GI diet due to catered lunches at work.     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   She has a past surgical history that includes Portacath placement (12/02/2011); Breast lumpectomy (2008); Colonoscopy; Mastectomy with axillary lymph node dissection (Right, 05/18/2012); Simple mastectomy with axillary sentinel node biopsy (Left, 05/18/2012); and Port-a-cath removal (N/A, 05/18/2012).   Her family history includes Breast cancer in her paternal aunt; Hypertension in her maternal grandmother and mother; Ovarian cancer in her maternal aunt; Pancreatic cancer in her paternal grandmother; Parkinsonism in her paternal grandfather.She reports that she quit smoking about 20 years ago. She smoked 0.50 packs per day. She has never used smokeless tobacco. She reports that she drinks alcohol. She reports that she does not use drugs.  Outpatient Medications Prior to Visit  Medication Sig Dispense Refill  . ALPRAZolam (XANAX) 0.25 MG tablet TAKE ONE TABLET BY MOUTH TWICE DAILY AS NEEDED FOR ANXIETY 60 tablet 4  . amLODipine (NORVASC) 5 MG tablet TAKE 1 TABLET BY MOUTH DAILY. 30 tablet 4  . Ascorbic Acid (VITAMIN C) 100 MG tablet Take 100 mg by mouth daily.      . Calcium Carbonate-Vitamin D (CALTRATE 600+D PO) Take 1 tablet by mouth daily.    . cholecalciferol (VITAMIN D) 400 UNITS TABS Take 1,000 Units by mouth daily.     Marland Kitchen esomeprazole (NEXIUM) 40 MG capsule Take 40 mg by mouth daily before breakfast.    . exemestane (AROMASIN) 25 MG tablet TAKE 1 TABLET BY MOUTH ONCE DAILY AFTER BREAKFAST 90 tablet 3  . goserelin (ZOLADEX) 3.6 MG injection Inject 3.6 mg into the skin every 28 (twenty-eight) days.    Marland Kitchen loperamide (IMODIUM A-D) 2 MG tablet Take 2 mg by mouth 4 (four) times daily  as needed for diarrhea or loose stools.     . meclizine (ANTIVERT) 25 MG tablet Take 1 tablet (25 mg total) by mouth 3 (three) times daily as needed for dizziness. 30 tablet 0  . Multiple Vitamin (MULTIVITAMIN) tablet Take 1 tablet by mouth daily.      . vitamin B-12 (CYANOCOBALAMIN) 1000 MCG tablet Take 1,000 mcg by mouth daily.     No facility-administered medications prior to visit.     Review of Systems   Patient denies, fevers, malaise, unintentional weight loss, skin rash, eye pain, sinus congestion and sinus pain, sore throat, dysphagia,  hemoptysis , cough, dyspnea, wheezing, chest pain, palpitations, orthopnea, edema, abdominal pain, nausea, melena, diarrhea, constipation, flank pain, dysuria, hematuria, urinary  Frequency, nocturia, numbness, tingling, seizures,  Focal weakness, Loss of consciousness,  Tremor, insomnia,  and suicidal ideation.      Objective:  BP (!) 162/90 (BP Location: Left Arm, Patient Position: Sitting, Cuff Size: Normal)   Pulse 83   Temp 98.3 F (36.8  C) (Oral)   Resp 15   Ht 5\' 4"  (1.626 m)   Wt 160 lb (72.6 kg)   SpO2 97%   BMI 27.46 kg/m   Physical Exam   General Appearance:    Alert, cooperative, no distress, appears stated age  Head:    Normocephalic, without obvious abnormality, atraumatic  Eyes:    PERRL, conjunctiva/corneas clear, EOM's intact, fundi    benign, both eyes  Ears:    Normal TM's and external ear canals, both ears  Nose:   Nares normal, septum midline, mucosa normal, no drainage    or sinus tenderness  Throat:   Lips, mucosa, and tongue normal; teeth and gums normal  Neck:   Supple, symmetrical, trachea midline, no adenopathy;    thyroid:  no enlargement/tenderness/nodules; no carotid   bruit or JVD  Back:     Symmetric, no curvature, ROM normal, no CVA tenderness  Lungs:     Clear to auscultation bilaterally, respirations unlabored  Chest Wall:    No tenderness or deformity   Heart:    Regular rate and rhythm, S1 and S2  normal, no murmur, rub   or gallop  Breast Exam:    Deferred.  She has had a bilateral mastectomy   Abdomen:     Soft, non-tender, bowel sounds active all four quadrants,    no masses, no organomegaly  Genitalia:    Pelvic: cervix normal in appearance, external genitalia normal, no adnexal masses or tenderness, no cervical motion tenderness, rectovaginal septum normal, uterus normal size, shape, and consistency and vagina normal without discharge  Extremities:   Extremities normal, atraumatic, no cyanosis or edema  Pulses:   2+ and symmetric all extremities  Skin:   Skin color, texture, turgor normal, no rashes or lesions  Lymph nodes:   Cervical, supraclavicular, and axillary nodes normal  Neurologic:   CNII-XII intact, normal strength, sensation and reflexes    throughout     Assessment & Plan:   Problem List Items Addressed This Visit    Vertigo, labyrinthine, bilateral    Second episode.  Initial episode last year Nov 2018 accompanied by an aural sensation prompted me to order  MRI with IACcs which was essentially normal.  Recent episode was evaluated by neurology,  Then ENT and Per report by patient,  Labyrinthitis diagnosis made by ENT . No treatment given.  Has Persistent symptoms.  Has had MR/MRA.  Trial of prednisone 60 mg qd x 3 followed by 5 day taper ,  Valium 5-10 mg every 12 hours,  And adding HCTZ given concurrent need for greater control of BP      Prediabetes    Her  random glucose is not  elevated but her A1c suggests she is at risk for developing diabetes.  I recommend he follow a low glycemic index diet and particpate regularly in an aerobic  exercise activity.  We should check an A1c in 6 months.        Hypertension    Not well controlled on amlodipine 5 mg.  Adding ARB/HCT given proteinuria.   Lab Results  Component Value Date   CREATININE 0.83 08/03/2017   Lab Results  Component Value Date   NA 140 08/03/2017   K 3.7 08/03/2017   CL 102 08/03/2017   CO2  28 08/03/2017   Lab Results  Component Value Date   MICROALBUR 2.8 (H) 08/03/2017         Relevant Medications   losartan-hydrochlorothiazide (HYZAAR) 50-12.5 MG tablet  Encounter for general adult medical examination with abnormal findings - Primary    Annual comprehensive preventive exam was done as well as an evaluation and management of chronic conditions .  During the course of the visit the patient was educated and counseled about appropriate screening and preventive services including :  diabetes screening, lipid analysis with projected  10 year  risk for CAD , nutrition counseling, breast, cervical and colorectal cancer screening, and recommended immunizations.  Printed recommendations for health maintenance screenings was given.  PAP smear given.         Other Visit Diagnoses    Cervical cancer screening       Relevant Orders   Cytology - PAP      I am having Mary [name] start on predniSONE, diazepam, and losartan-hydrochlorothiazide. I am also having her maintain her vitamin C, multivitamin, cholecalciferol, loperamide, Calcium Carbonate-Vitamin D (CALTRATE 600+D PO), esomeprazole, ALPRAZolam, goserelin, meclizine, amLODipine, exemestane, and vitamin B-12.  Meds ordered this encounter  Medications  . predniSONE (DELTASONE) 10 MG tablet    Sig: 6 tablets daily for 3  then reduce by 1 tablet daily until gone    Dispense:  33 tablet    Refill:  0  . diazepam (VALIUM) 10 MG tablet    Sig: Take 1 tablet (10 mg total) by mouth every 12 (twelve) hours as needed for anxiety.    Dispense:  30 tablet    Refill:  1  . losartan-hydrochlorothiazide (HYZAAR) 50-12.5 MG tablet    Sig: Take 1 tablet by mouth daily.    Dispense:  90 tablet    Refill:  0    There are no discontinued medications.  Follow-up: No follow-ups on file.   [name], MD

## 2017-08-25 NOTE — Assessment & Plan Note (Addendum)
Not well controlled on amlodipine 5 mg.  Adding ARB/HCT given proteinuria.   Lab Results  Component Value Date   CREATININE 0.83 08/03/2017   Lab Results  Component Value Date   NA 140 08/03/2017   K 3.7 08/03/2017   CL 102 08/03/2017   CO2 28 08/03/2017   Lab Results  Component Value Date   MICROALBUR 2.8 (H) 08/03/2017

## 2017-08-25 NOTE — Patient Instructions (Addendum)
I am treating you for labyrinthitis with   Prednisone 6 tablets daily for 3 days,   Then taper by 1 tablet daily until gone  Valium 5 to 10 mg every 12 hours   For your blood pressure:  adding losartan/Hct once daily in the am  Continue amlodipine at night instead of am    Return July 2 for follow up    Health Maintenance for Postmenopausal Women Menopause is a normal process in which your reproductive ability comes to an end. This process happens gradually over a span of months to years, usually between the ages of 11 and 38. Menopause is complete when you have missed 12 consecutive menstrual periods. It is important to talk with your health care provider about some of the most common conditions that affect postmenopausal women, such as heart disease, cancer, and bone loss (osteoporosis). Adopting a healthy lifestyle and getting preventive care can help to promote your health and wellness. Those actions can also lower your chances of developing some of these common conditions. What should I know about menopause? During menopause, you may experience a number of symptoms, such as:  Moderate-to-severe hot flashes.  Night sweats.  Decrease in sex drive.  Mood swings.  Headaches.  Tiredness.  Irritability.  Memory problems.  Insomnia.  Choosing to treat or not to treat menopausal changes is an individual decision that you make with your health care provider. What should I know about hormone replacement therapy and supplements? Hormone therapy products are effective for treating symptoms that are associated with menopause, such as hot flashes and night sweats. Hormone replacement carries certain risks, especially as you become older. If you are thinking about using estrogen or estrogen with progestin treatments, discuss the benefits and risks with your health care provider. What should I know about heart disease and stroke? Heart disease, heart attack, and stroke become more  likely as you age. This may be due, in part, to the hormonal changes that your body experiences during menopause. These can affect how your body processes dietary fats, triglycerides, and cholesterol. Heart attack and stroke are both medical emergencies. There are many things that you can do to help prevent heart disease and stroke:  Have your blood pressure checked at least every 1-2 years. High blood pressure causes heart disease and increases the risk of stroke.  If you are 60-58 years old, ask your health care provider if you should take aspirin to prevent a heart attack or a stroke.  Do not use any tobacco products, including cigarettes, chewing tobacco, or electronic cigarettes. If you need help quitting, ask your health care provider.  It is important to eat a healthy diet and maintain a healthy weight. ? Be sure to include plenty of vegetables, fruits, low-fat dairy products, and lean protein. ? Avoid eating foods that are high in solid fats, added sugars, or salt (sodium).  Get regular exercise. This is one of the most important things that you can do for your health. ? Try to exercise for at least 150 minutes each week. The type of exercise that you do should increase your heart rate and make you sweat. This is known as moderate-intensity exercise. ? Try to do strengthening exercises at least twice each week. Do these in addition to the moderate-intensity exercise.  Know your numbers.Ask your health care provider to check your cholesterol and your blood glucose. Continue to have your blood tested as directed by your health care provider.  What should I know about  cancer screening? There are several types of cancer. Take the following steps to reduce your risk and to catch any cancer development as early as possible. Breast Cancer  Practice breast self-awareness. ? This means understanding how your breasts normally appear and feel. ? It also means doing regular breast self-exams.  Let your health care provider know about any changes, no matter how small.  If you are 59 or older, have a clinician do a breast exam (clinical breast exam or CBE) every year. Depending on your age, family history, and medical history, it may be recommended that you also have a yearly breast X-ray (mammogram).  If you have a family history of breast cancer, talk with your health care provider about genetic screening.  If you are at high risk for breast cancer, talk with your health care provider about having an MRI and a mammogram every year.  Breast cancer (BRCA) gene test is recommended for women who have family members with BRCA-related cancers. Results of the assessment will determine the need for genetic counseling and BRCA1 and for BRCA2 testing. BRCA-related cancers include these types: ? Breast. This occurs in males or females. ? Ovarian. ? Tubal. This may also be called fallopian tube cancer. ? Cancer of the abdominal or pelvic lining (peritoneal cancer). ? Prostate. ? Pancreatic.  Cervical, Uterine, and Ovarian Cancer Your health care provider may recommend that you be screened regularly for cancer of the pelvic organs. These include your ovaries, uterus, and vagina. This screening involves a pelvic exam, which includes checking for microscopic changes to the surface of your cervix (Pap test).  For women ages 21-65, health care providers may recommend a pelvic exam and a Pap test every three years. For women ages 28-65, they may recommend the Pap test and pelvic exam, combined with testing for human papilloma virus (HPV), every five years. Some types of HPV increase your risk of cervical cancer. Testing for HPV may also be done on women of any age who have unclear Pap test results.  Other health care providers may not recommend any screening for nonpregnant women who are considered low risk for pelvic cancer and have no symptoms. Ask your health care provider if a screening pelvic exam  is right for you.  If you have had past treatment for cervical cancer or a condition that could lead to cancer, you need Pap tests and screening for cancer for at least 20 years after your treatment. If Pap tests have been discontinued for you, your risk factors (such as having a new sexual partner) need to be reassessed to determine if you should start having screenings again. Some women have medical problems that increase the chance of getting cervical cancer. In these cases, your health care provider may recommend that you have screening and Pap tests more often.  If you have a family history of uterine cancer or ovarian cancer, talk with your health care provider about genetic screening.  If you have vaginal bleeding after reaching menopause, tell your health care provider.  There are currently no reliable tests available to screen for ovarian cancer.  Lung Cancer Lung cancer screening is recommended for adults 38-44 years old who are at high risk for lung cancer because of a history of smoking. A yearly low-dose CT scan of the lungs is recommended if you:  Currently smoke.  Have a history of at least 30 pack-years of smoking and you currently smoke or have quit within the past 15 years. A pack-year is  smoking an average of one pack of cigarettes per day for one year.  Yearly screening should:  Continue until it has been 15 years since you quit.  Stop if you develop a health problem that would prevent you from having lung cancer treatment.  Colorectal Cancer  This type of cancer can be detected and can often be prevented.  Routine colorectal cancer screening usually begins at age 43 and continues through age 29.  If you have risk factors for colon cancer, your health care provider may recommend that you be screened at an earlier age.  If you have a family history of colorectal cancer, talk with your health care provider about genetic screening.  Your health care provider may also  recommend using home test kits to check for hidden blood in your stool.  A small camera at the end of a tube can be used to examine your colon directly (sigmoidoscopy or colonoscopy). This is done to check for the earliest forms of colorectal cancer.  Direct examination of the colon should be repeated every 5-10 years until age 51. However, if early forms of precancerous polyps or small growths are found or if you have a family history or genetic risk for colorectal cancer, you may need to be screened more often.  Skin Cancer  Check your skin from head to toe regularly.  Monitor any moles. Be sure to tell your health care provider: ? About any new moles or changes in moles, especially if there is a change in a mole's shape or color. ? If you have a mole that is larger than the size of a pencil eraser.  If any of your family members has a history of skin cancer, especially at a young age, talk with your health care provider about genetic screening.  Always use sunscreen. Apply sunscreen liberally and repeatedly throughout the day.  Whenever you are outside, protect yourself by wearing long sleeves, pants, a wide-brimmed hat, and sunglasses.  What should I know about osteoporosis? Osteoporosis is a condition in which bone destruction happens more quickly than new bone creation. After menopause, you may be at an increased risk for osteoporosis. To help prevent osteoporosis or the bone fractures that can happen because of osteoporosis, the following is recommended:  If you are 48-7 years old, get at least 1,000 mg of calcium and at least 600 mg of vitamin D per day.  If you are older than age 64 but younger than age 62, get at least 1,200 mg of calcium and at least 600 mg of vitamin D per day.  If you are older than age 68, get at least 1,200 mg of calcium and at least 800 mg of vitamin D per day.  Smoking and excessive alcohol intake increase the risk of osteoporosis. Eat foods that are  rich in calcium and vitamin D, and do weight-bearing exercises several times each week as directed by your health care provider. What should I know about how menopause affects my mental health? Depression may occur at any age, but it is more common as you become older. Common symptoms of depression include:  Low or sad mood.  Changes in sleep patterns.  Changes in appetite or eating patterns.  Feeling an overall lack of motivation or enjoyment of activities that you previously enjoyed.  Frequent crying spells.  Talk with your health care provider if you think that you are experiencing depression. What should I know about immunizations? It is important that you get and maintain  your immunizations. These include:  Tetanus, diphtheria, and pertussis (Tdap) booster vaccine.  Influenza every year before the flu season begins.  Pneumonia vaccine.  Shingles vaccine.  Your health care provider may also recommend other immunizations. This information is not intended to replace advice given to you by your health care provider. Make sure you discuss any questions you have with your health care provider. Document Released: 04/29/2005 Document Revised: 09/25/2015 Document Reviewed: 12/09/2014 Elsevier Interactive Patient Education  2018 [employer].

## 2017-08-25 NOTE — Assessment & Plan Note (Addendum)
Annual comprehensive preventive exam was done as well as an evaluation and management of chronic conditions .  During the course of the visit the patient was educated and counseled about appropriate screening and preventive services including :  diabetes screening, lipid analysis with projected  10 year  risk for CAD , nutrition counseling, breast, cervical and colorectal cancer screening, and recommended immunizations.  Printed recommendations for health maintenance screenings was given.  PAP smear given.

## 2017-08-27 ENCOUNTER — Encounter: Payer: Self-pay | Admitting: Internal Medicine

## 2017-08-27 DIAGNOSIS — H8103 Meniere's disease, bilateral: Secondary | ICD-10-CM | POA: Insufficient documentation

## 2017-08-27 DIAGNOSIS — R7303 Prediabetes: Secondary | ICD-10-CM | POA: Insufficient documentation

## 2017-08-27 NOTE — Assessment & Plan Note (Addendum)
Second episode.  Initial episode last year Nov 2018 accompanied by an aural sensation prompted me to order  MRI with IACcs which was essentially normal.  Recent episode was evaluated by neurology,  Then ENT and Per report by patient,  Labyrinthitis diagnosis made by ENT . No treatment given.  Has Persistent symptoms.  Has had MR/MRA.  Trial of prednisone 60 mg qd x 3 followed by 5 day taper ,  Valium 5-10 mg every 12 hours,  And adding HCTZ given concurrent need for greater control of BP

## 2017-08-27 NOTE — Assessment & Plan Note (Signed)
Her  random glucose is not  elevated but her A1c suggests she is at risk for developing diabetes.  I recommend he follow a low glycemic index diet and particpate regularly in an aerobic  exercise activity.  We should check an A1c in 6 months.   

## 2017-08-30 LAB — CYTOLOGY - PAP
Diagnosis: NEGATIVE
HPV: NOT DETECTED

## 2017-09-15 ENCOUNTER — Inpatient Hospital Stay: Payer: 59 | Attending: Hematology and Oncology

## 2017-09-15 ENCOUNTER — Telehealth: Payer: Self-pay | Admitting: Hematology and Oncology

## 2017-09-15 VITALS — BP 132/85 | HR 77 | Temp 98.0°F | Resp 18

## 2017-09-15 DIAGNOSIS — C50412 Malignant neoplasm of upper-outer quadrant of left female breast: Secondary | ICD-10-CM | POA: Diagnosis not present

## 2017-09-15 DIAGNOSIS — Z79818 Long term (current) use of other agents affecting estrogen receptors and estrogen levels: Secondary | ICD-10-CM | POA: Insufficient documentation

## 2017-09-15 DIAGNOSIS — C779 Secondary and unspecified malignant neoplasm of lymph node, unspecified: Secondary | ICD-10-CM | POA: Diagnosis not present

## 2017-09-15 MED ORDER — GOSERELIN ACETATE 3.6 MG ~~LOC~~ IMPL
3.6000 mg | DRUG_IMPLANT | Freq: Once | SUBCUTANEOUS | Status: AC
  Administered 2017-09-15: 3.6 mg via SUBCUTANEOUS

## 2017-09-15 MED ORDER — GOSERELIN ACETATE 3.6 MG ~~LOC~~ IMPL
DRUG_IMPLANT | SUBCUTANEOUS | Status: AC
  Filled 2017-09-15: qty 3.6

## 2017-09-15 NOTE — Telephone Encounter (Signed)
Patient came in she said she was missing July injection. I adjusted schedule for injection every 4 weeks

## 2017-09-18 ENCOUNTER — Encounter: Payer: Self-pay | Admitting: Internal Medicine

## 2017-09-18 ENCOUNTER — Ambulatory Visit: Payer: 59 | Admitting: Internal Medicine

## 2017-09-18 ENCOUNTER — Encounter

## 2017-09-18 VITALS — BP 130/82 | HR 79 | Temp 97.7°F | Resp 14 | Ht 64.0 in | Wt 160.8 lb

## 2017-09-18 DIAGNOSIS — F411 Generalized anxiety disorder: Secondary | ICD-10-CM

## 2017-09-18 DIAGNOSIS — R7303 Prediabetes: Secondary | ICD-10-CM | POA: Diagnosis not present

## 2017-09-18 DIAGNOSIS — F5105 Insomnia due to other mental disorder: Secondary | ICD-10-CM | POA: Diagnosis not present

## 2017-09-18 DIAGNOSIS — H8103 Meniere's disease, bilateral: Secondary | ICD-10-CM

## 2017-09-18 DIAGNOSIS — I10 Essential (primary) hypertension: Secondary | ICD-10-CM | POA: Diagnosis not present

## 2017-09-18 DIAGNOSIS — Z1211 Encounter for screening for malignant neoplasm of colon: Secondary | ICD-10-CM | POA: Diagnosis not present

## 2017-09-18 DIAGNOSIS — F409 Phobic anxiety disorder, unspecified: Secondary | ICD-10-CM

## 2017-09-18 DIAGNOSIS — K5792 Diverticulitis of intestine, part unspecified, without perforation or abscess without bleeding: Secondary | ICD-10-CM | POA: Diagnosis not present

## 2017-09-18 MED ORDER — DIAZEPAM 10 MG PO TABS: 10.0000 mg | ORAL_TABLET | Freq: Every evening | ORAL | 5 refills | Status: DC | PRN

## 2017-09-18 MED ORDER — TRAZODONE HCL 50 MG PO TABS: 25.0000 mg | ORAL_TABLET | Freq: Every evening | ORAL | 1 refills | Status: DC | PRN

## 2017-09-18 MED FILL — traZODone HCL 50 MG TABS: 50 | 90 days supply | Qty: 90 | Fill #0

## 2017-09-18 NOTE — Progress Notes (Signed)
**Note Mary-Identified via Obfuscation** Subjective:  Patient ID: Mary [name], female    DOB: 1960-09-21  Age: 57 y.o. MRN: [id number]  CC: The primary encounter diagnosis was Essential hypertension. Diagnoses of Vertigo, labyrinthine, bilateral, Prediabetes, Insomnia due to anxiety and fear, Diverticulitis, and Generalized anxiety disorder were also pertinent to this visit.  HPI Mary [name] presents for follow up on recent abnormal labs    Had an episdoe of severe llq pain that lasted a week . Occurred  after eating strawberries . No alteration in bowel movement, bus has IBS.  Last colonoscopy 2008.  Normal colonoscopy last done in 2008 er patient.  Discussed referral to [facility] .    Vertigo resolved with treatment for labrynthitis (prednisone taper and diazepam )  GAD:  Notes that she Felt much better when she was taking the diazepam for management of vertigo due to suspected labrynthitis.   slept better,  anxiety  Was not as persistent .    Outpatient Medications Prior to Visit  Medication Sig Dispense Refill  . amLODipine (NORVASC) 5 MG tablet TAKE 1 TABLET BY MOUTH DAILY. 30 tablet 4  . Ascorbic Acid (VITAMIN C) 100 MG tablet Take 100 mg by mouth daily.      . Calcium Carbonate-Vitamin D (CALTRATE 600+D PO) Take 1 tablet by mouth daily.    Marland Kitchen esomeprazole (NEXIUM) 40 MG capsule Take 40 mg by mouth daily before breakfast.    . exemestane (AROMASIN) 25 MG tablet TAKE 1 TABLET BY MOUTH ONCE DAILY AFTER BREAKFAST 90 tablet 3  . goserelin (ZOLADEX) 3.6 MG injection Inject 3.6 mg into the skin every 28 (twenty-eight) days.    Marland Kitchen loperamide (IMODIUM A-D) 2 MG tablet Take 2 mg by mouth 4 (four) times daily as needed for diarrhea or loose stools.     Marland Kitchen losartan-hydrochlorothiazide (HYZAAR) 50-12.5 MG tablet Take 1 tablet by mouth daily. 90 tablet 0  . Multiple Vitamin (MULTIVITAMIN) tablet Take 1 tablet by mouth daily.      . vitamin B-12 (CYANOCOBALAMIN) 1000 MCG tablet Take 1,000 mcg by mouth daily.    Marland Kitchen  ALPRAZolam (XANAX) 0.25 MG tablet TAKE ONE TABLET BY MOUTH TWICE DAILY AS NEEDED FOR ANXIETY 60 tablet 4  . diazepam (VALIUM) 10 MG tablet Take 1 tablet (10 mg total) by mouth every 12 (twelve) hours as needed for anxiety. 30 tablet 1  . cholecalciferol (VITAMIN D) 400 UNITS TABS Take 1,000 Units by mouth daily.     . meclizine (ANTIVERT) 25 MG tablet Take 1 tablet (25 mg total) by mouth 3 (three) times daily as needed for dizziness. (Patient not taking: Reported on 09/18/2017) 30 tablet 0  . predniSONE (DELTASONE) 10 MG tablet 6 tablets daily for 3  then reduce by 1 tablet daily until gone (Patient not taking: Reported on 09/18/2017) 33 tablet 0   No facility-administered medications prior to visit.     Review of Systems;  Patient denies headache, fevers, malaise, unintentional weight loss, skin rash, eye pain, sinus congestion and sinus pain, sore throat, dysphagia,  hemoptysis , cough, dyspnea, wheezing, chest pain, palpitations, orthopnea, edema, abdominal pain, nausea, melena, diarrhea, constipation, flank pain, dysuria, hematuria, urinary  Frequency, nocturia, numbness, tingling, seizures,  Focal weakness, Loss of consciousness,  Tremor, insomnia, depression, anxiety, and suicidal ideation.      Objective:  BP 130/82 (BP Location: Left Arm, Patient Position: Sitting, Cuff Size: Normal)   Pulse 79   Temp 97.7 F (36.5 C) (Oral)   Resp 14   Ht  5\' 4"  (1.626 m)   Wt 160 lb 12.8 oz (72.9 kg)   SpO2 97%   BMI 27.60 kg/m   BP Readings from Last 3 Encounters:  09/18/17 130/82  09/15/17 132/85  08/25/17 (!) 162/90    Wt Readings from Last 3 Encounters:  09/18/17 160 lb 12.8 oz (72.9 kg)  08/25/17 160 lb (72.6 kg)  08/10/17 162 lb 6.4 oz (73.7 kg)    General appearance: alert, cooperative and appears stated age Back: symmetric, no curvature. ROM normal. No CVA tenderness. Lungs: clear to auscultation bilaterally Heart: regular rate and rhythm, S1, S2 normal, no murmur, click, rub or  gallop Abdomen: soft, non-tender; bowel sounds normal; no masses,  no organomegaly Pulses: 2+ and symmetric Skin: Skin color, texture, turgor normal. No rashes or lesions Lymph nodes: Cervical, supraclavicular, and axillary nodes normal. Psych: affect normal, makes good eye contact. No fidgeting,  Smiles easily.  Denies suicidal thoughts   Lab Results  Component Value Date   HGBA1C 6.2 08/03/2017   HGBA1C 6.0 01/18/2017    Lab Results  Component Value Date   CREATININE 0.83 08/03/2017   CREATININE 0.77 01/18/2017   CREATININE 0.93 09/19/2014    Lab Results  Component Value Date   WBC 7.3 01/18/2017   HGB 15.0 01/18/2017   HCT 45.6 01/18/2017   PLT 264.0 01/18/2017   GLUCOSE 121 (H) 08/03/2017   CHOL 237 (H) 08/03/2017   TRIG 141.0 08/03/2017   HDL 57.60 08/03/2017   LDLDIRECT 140.6 01/18/2011   LDLCALC 151 (H) 08/03/2017   ALT 19 08/03/2017   AST 16 08/03/2017   NA 140 08/03/2017   K 3.7 08/03/2017   CL 102 08/03/2017   CREATININE 0.83 08/03/2017   BUN 13 08/03/2017   CO2 28 08/03/2017   TSH 2.68 01/18/2017   HGBA1C 6.2 08/03/2017   MICROALBUR 2.8 (H) 08/03/2017    No results found.  Assessment & Plan:   Problem List Items Addressed This Visit    Hypertension - Primary   Relevant Orders   Comprehensive metabolic panel   Prediabetes   Relevant Orders   Lipid panel   Hemoglobin A1c   Vertigo, labyrinthine, bilateral    Resolved with prednisone and valium       Insomnia due to anxiety and fear    Improved with use of valium 2.5 mg .  Advised to alternate with trazodone.       Diverticulitis   Generalized anxiety disorder    With insomnia .  Discouraged use of valium more than prn bedtime.  . Will consider SSRI therapy       Relevant Medications   diazepam (VALIUM) 10 MG tablet   traZODone (DESYREL) 50 MG tablet    A total of 25 minutes of face to face time was spent with patient more than half of which was spent in counselling about the above  mentioned conditions  and coordination of care   I have discontinued Joelene Millin A. Barsch's cholecalciferol, ALPRAZolam, meclizine, and predniSONE. I have also changed her diazepam. Additionally, I am having her start on traZODone. Lastly, I am having her maintain her vitamin C, multivitamin, loperamide, Calcium Carbonate-Vitamin D (CALTRATE 600+D PO), esomeprazole, goserelin, amLODipine, exemestane, vitamin B-12, and losartan-hydrochlorothiazide.  Meds ordered this encounter  Medications  . diazepam (VALIUM) 10 MG tablet    Sig: Take 1 tablet (10 mg total) by mouth at bedtime as needed for anxiety.    Dispense:  30 tablet    Refill:  5  .  traZODone (DESYREL) 50 MG tablet    Sig: Take 0.5-1 tablets (25-50 mg total) by mouth at bedtime as needed for sleep.    Dispense:  90 tablet    Refill:  1    Medications Discontinued During This Encounter  Medication Reason  . cholecalciferol (VITAMIN D) 400 UNITS TABS Patient has not taken in last 30 days  . meclizine (ANTIVERT) 25 MG tablet Patient has not taken in last 30 days  . predniSONE (DELTASONE) 10 MG tablet Completed Course  . ALPRAZolam (XANAX) 0.25 MG tablet   . diazepam (VALIUM) 10 MG tablet Reorder    Follow-up: Return in about 6 months (around 03/21/2018) for prediabets,  anxiety .   [name], MD

## 2017-09-18 NOTE — Patient Instructions (Signed)
I'm glad you are feeling better   I have refilled the valium to use once daily  for insomnia   Please try to  alternate using it nightly  With trazodone( if effective at 25 ,  50 or the 100 mg dose )    If yoy need to use valium 2 times daily more days than not,, ,  We should add an anti anxiety SSRI (lexparo or Paxil)    See you in 6 months

## 2017-09-18 NOTE — Assessment & Plan Note (Signed)
Resolved with prednisone and valium

## 2017-09-19 DIAGNOSIS — F411 Generalized anxiety disorder: Secondary | ICD-10-CM | POA: Insufficient documentation

## 2017-09-19 DIAGNOSIS — K5792 Diverticulitis of intestine, part unspecified, without perforation or abscess without bleeding: Secondary | ICD-10-CM | POA: Insufficient documentation

## 2017-09-19 DIAGNOSIS — F5105 Insomnia due to other mental disorder: Secondary | ICD-10-CM | POA: Insufficient documentation

## 2017-09-19 DIAGNOSIS — F409 Phobic anxiety disorder, unspecified: Secondary | ICD-10-CM | POA: Insufficient documentation

## 2017-09-19 NOTE — Assessment & Plan Note (Signed)
With insomnia .  Discouraged use of valium more than prn bedtime.  . Will consider SSRI therapy

## 2017-09-19 NOTE — Assessment & Plan Note (Signed)
Improved with treatment of anxiety brought about by use of valium during episode  Of labrynthitis

## 2017-09-19 NOTE — Addendum Note (Signed)
Addended by: [name] on: 09/19/2017 10:33 PM   Modules accepted: Orders

## 2017-09-19 NOTE — Assessment & Plan Note (Signed)
Improved with use of valium 2.5 mg .  Advised to alternate with trazodone.

## 2017-09-27 ENCOUNTER — Other Ambulatory Visit: Payer: Self-pay | Admitting: Internal Medicine

## 2017-09-27 MED FILL — AMLODIPINE BESYLATE 5 MG TA: 5 | 90 days supply | Qty: 90 | Fill #0

## 2017-09-27 MED FILL — diazePAM 10 MG TABS: 10 | 30 days supply | Qty: 30 | Fill #0

## 2017-09-29 ENCOUNTER — Other Ambulatory Visit: Payer: Self-pay

## 2017-09-29 DIAGNOSIS — Z1211 Encounter for screening for malignant neoplasm of colon: Secondary | ICD-10-CM

## 2017-10-03 ENCOUNTER — Other Ambulatory Visit: Payer: Self-pay

## 2017-10-03 DIAGNOSIS — G43109 Migraine with aura, not intractable, without status migrainosus: Secondary | ICD-10-CM | POA: Diagnosis not present

## 2017-10-04 ENCOUNTER — Other Ambulatory Visit: Payer: Self-pay

## 2017-10-04 MED ORDER — NA SULFATE-K SULFATE-MG SULF 17.5-3.13-1.6 GM/177ML PO SOLN: 1.0000 | Freq: Once | ORAL | 0 refills | Status: AC

## 2017-10-10 ENCOUNTER — Other Ambulatory Visit: Payer: Self-pay

## 2017-10-10 ENCOUNTER — Ambulatory Visit
Admission: RE | Admit: 2017-10-10 | Discharge: 2017-10-10 | Disposition: A | Discharge status: Home / Self Care | Payer: 59 | Source: Ambulatory Visit | Attending: Gastroenterology | Admitting: Gastroenterology

## 2017-10-10 ENCOUNTER — Encounter: Payer: Self-pay | Admitting: *Deleted

## 2017-10-10 ENCOUNTER — Ambulatory Visit: Payer: 59 | Admitting: Certified Registered Nurse Anesthetist

## 2017-10-10 ENCOUNTER — Encounter
Admission: RE | Disposition: A | Discharge status: Home / Self Care | Payer: Self-pay | Source: Ambulatory Visit | Attending: Gastroenterology

## 2017-10-10 DIAGNOSIS — D126 Benign neoplasm of colon, unspecified: Secondary | ICD-10-CM

## 2017-10-10 DIAGNOSIS — Z79899 Other long term (current) drug therapy: Secondary | ICD-10-CM | POA: Diagnosis not present

## 2017-10-10 DIAGNOSIS — D120 Benign neoplasm of cecum: Secondary | ICD-10-CM | POA: Insufficient documentation

## 2017-10-10 DIAGNOSIS — K219 Gastro-esophageal reflux disease without esophagitis: Secondary | ICD-10-CM | POA: Diagnosis not present

## 2017-10-10 DIAGNOSIS — Z853 Personal history of malignant neoplasm of breast: Secondary | ICD-10-CM | POA: Diagnosis not present

## 2017-10-10 DIAGNOSIS — Z1211 Encounter for screening for malignant neoplasm of colon: Secondary | ICD-10-CM

## 2017-10-10 DIAGNOSIS — K640 First degree hemorrhoids: Secondary | ICD-10-CM | POA: Diagnosis not present

## 2017-10-10 DIAGNOSIS — Z87891 Personal history of nicotine dependence: Secondary | ICD-10-CM | POA: Insufficient documentation

## 2017-10-10 DIAGNOSIS — F411 Generalized anxiety disorder: Secondary | ICD-10-CM | POA: Diagnosis not present

## 2017-10-10 DIAGNOSIS — F419 Anxiety disorder, unspecified: Secondary | ICD-10-CM | POA: Diagnosis not present

## 2017-10-10 DIAGNOSIS — K635 Polyp of colon: Secondary | ICD-10-CM | POA: Diagnosis not present

## 2017-10-10 DIAGNOSIS — I10 Essential (primary) hypertension: Secondary | ICD-10-CM | POA: Insufficient documentation

## 2017-10-10 DIAGNOSIS — D122 Benign neoplasm of ascending colon: Secondary | ICD-10-CM | POA: Diagnosis not present

## 2017-10-10 HISTORY — DX: Dizziness and giddiness: R42

## 2017-10-10 HISTORY — PX: COLONOSCOPY WITH PROPOFOL: SHX5780

## 2017-10-10 SURGERY — COLONOSCOPY WITH PROPOFOL
Anesthesia: General

## 2017-10-10 MED ORDER — SODIUM CHLORIDE 0.9 % IV SOLN
INTRAVENOUS | Status: DC
  Administered 2017-10-10: 10:00:00 via INTRAVENOUS

## 2017-10-10 MED ORDER — PROPOFOL 500 MG/50ML IV EMUL
INTRAVENOUS | Status: AC
  Filled 2017-10-10: qty 50

## 2017-10-10 MED ORDER — LIDOCAINE HCL (CARDIAC) PF 100 MG/5ML IV SOSY
PREFILLED_SYRINGE | INTRAVENOUS | Status: DC | PRN
  Administered 2017-10-10: 50 mg via INTRAVENOUS

## 2017-10-10 MED ORDER — LIDOCAINE HCL (PF) 2 % IJ SOLN
INTRAMUSCULAR | Status: AC
  Filled 2017-10-10: qty 10

## 2017-10-10 MED ORDER — MIDAZOLAM HCL 2 MG/2ML IJ SOLN
INTRAMUSCULAR | Status: AC
  Filled 2017-10-10: qty 2

## 2017-10-10 MED ORDER — PROPOFOL 10 MG/ML IV BOLUS
INTRAVENOUS | Status: DC | PRN
  Administered 2017-10-10: 100 mg via INTRAVENOUS

## 2017-10-10 MED ORDER — MIDAZOLAM HCL 2 MG/2ML IJ SOLN
INTRAMUSCULAR | Status: DC | PRN
  Administered 2017-10-10: 2 mg via INTRAVENOUS

## 2017-10-10 MED ORDER — PROPOFOL 500 MG/50ML IV EMUL
INTRAVENOUS | Status: DC | PRN
  Administered 2017-10-10: 140 ug/kg/min via INTRAVENOUS

## 2017-10-10 NOTE — Anesthesia Post-op Follow-up Note (Signed)
Anesthesia QCDR form completed.        

## 2017-10-10 NOTE — Op Note (Signed)
[facility] Gastroenterology Patient Name: Mary [name] Procedure Date: 10/10/2017 10:07 AM MRN: [id number] Account #: [id number] Date of Birth: [birth year] Admit Type: Outpatient Age: 57 Room: [facility] ENDO ROOM 4 Gender: Female Note Status: Finalized Procedure:            Colonoscopy Indications:          Screening for colorectal malignant neoplasm Providers:            Lucilla Lame MD, MD Referring MD:         Deborra Medina, MD (Referring MD) Medicines:            Propofol per Anesthesia Complications:        No immediate complications. Procedure:            Pre-Anesthesia Assessment:                       - Prior to the procedure, a History and Physical was                        performed, and patient medications and allergies were                        reviewed. The patient's tolerance of previous                        anesthesia was also reviewed. The risks and benefits of                        the procedure and the sedation options and risks were                        discussed with the patient. All questions were                        answered, and informed consent was obtained. Prior                        Anticoagulants: The patient has taken no previous                        anticoagulant or antiplatelet agents. ASA Grade                        Assessment: II - A patient with mild systemic disease.                        After reviewing the risks and benefits, the patient was                        deemed in satisfactory condition to undergo the                        procedure.                       After obtaining informed consent, the colonoscope was                        passed under direct vision. Throughout the procedure,  the patient's blood pressure, pulse, and oxygen                        saturations were monitored continuously. The                        Colonoscope was introduced through the anus and           advanced to the the cecum, identified by appendiceal                        orifice and ileocecal valve. The colonoscopy was                        performed without difficulty. The patient tolerated the                        procedure well. The quality of the bowel preparation                        was excellent. Findings:      The perianal and digital rectal examinations were normal.      A 7 mm polyp was found in the cecum. The polyp was sessile. The polyp       was removed with a cold snare. Resection and retrieval were complete.      A 5 mm polyp was found in the ascending colon. The polyp was sessile.       The polyp was removed with a cold snare. Resection and retrieval were       complete.      Non-bleeding internal hemorrhoids were found during retroflexion. The       hemorrhoids were Grade I (internal hemorrhoids that do not prolapse). Impression:           - One 7 mm polyp in the cecum, removed with a cold                        snare. Resected and retrieved.                       - One 5 mm polyp in the ascending colon, removed with a                        cold snare. Resected and retrieved.                       - Non-bleeding internal hemorrhoids. Recommendation:       - Discharge patient to home.                       - Resume previous diet.                       - Continue present medications.                       - Await pathology results.                       - Repeat colonoscopy in 5 years if polyp adenoma and 10  years if hyperplastic Procedure Code(s):    --- Professional ---                       [id number], Colonoscopy, flexible; with removal of tumor(s),                        polyp(s), or other lesion(s) by snare technique Diagnosis Code(s):    --- Professional ---                       Z12.11, Encounter for screening for malignant neoplasm                        of colon                       D12.0, Benign neoplasm of cecum                        D12.2, Benign neoplasm of ascending colon CPT copyright 2017 American Medical Association. All rights reserved. The codes documented in this report are preliminary and upon coder review may  be revised to meet current compliance requirements. Lucilla Lame MD, MD 10/10/2017 10:34:09 AM This report has been signed electronically. Number of Addenda: 0 Note Initiated On: 10/10/2017 10:07 AM Scope Withdrawal Time: 0 hours 12 minutes 23 seconds  Total Procedure Duration: 0 hours 15 minutes 47 seconds       [facility]

## 2017-10-10 NOTE — H&P (Signed)
Mary Lame, MD [address] [place], [place] [address] Phone: [phone] Fax : [phone]  Primary Care Physician:  [name], MD Primary Gastroenterologist:  Dr. Allen Norris  Pre-Procedure History & Physical: HPI:  Mary [name] is a 57 y.o. female is here for a screening colonoscopy.   Past Medical History:  Diagnosis Date  . Anxiety    takes Xanax prn  . Breast cancer [facility]) August 2013   bilateral, er/pr+  . Complication of anesthesia    pt states she had the shakes extremely bad  . Cough    at night d/t reflux;nothing productive  . Dental crowns present    x 4  . GERD (gastroesophageal reflux disease)    takes Nexium daily  . History of bronchitis    08/2011  . History of colon polyps   . Hot flashes, menopausal 08/08/2012  . IBS (irritable bowel syndrome)    immodium prn  . Insomnia    takes Ambien nightly  . Irritable bowel syndrome   . Neutropenia, drug-induced ([place]) 12/15/2011  . Nocturia   . Plantar fasciitis   . S/P radiation therapy 07/03/2012 through 08/18/2038                                                        Right chest wall/regional lymph nodes 5040 cGy 28 sessions, right chest wall/mastectomy scar boost 1000 cGy 5 sessions                          . Status post chemotherapy    FEC 100 starting on 12/08/2011 - 01/18/12/single agent Taxol x12 weeks from 02/02/2012 through January 2014   . Urinary frequency   . Use of tamoxifen (Nolvadex)   . Vertigo     Past Surgical History:  Procedure Laterality Date  . BREAST LUMPECTOMY  2008   left  . COLONOSCOPY    . MASTECTOMY    . MASTECTOMY WITH AXILLARY LYMPH NODE DISSECTION Right 05/18/2012   Procedure: MASTECTOMY WITH AXILLARY LYMPH NODE DISSECTION;  Surgeon: Adin Hector, MD;  Location: [place];  Service: General;  Laterality: Right;  Bilateral Total Mastectomy , right axillary lymph node dissection left axillary sentinel node biopsy, removal of Porta Cath  . PORT-A-CATH  REMOVAL N/A 05/18/2012   Procedure: REMOVAL PORT-A-CATH;  Surgeon: Adin Hector, MD;  Location: [place];  Service: General;  Laterality: N/A;  . PORTACATH PLACEMENT  12/02/2011   Procedure: INSERTION PORT-A-CATH;  Surgeon: Adin Hector, MD;  Location: [place];  Service: General;  Laterality: N/A;  insertion port a cath with fluoroscopy  . SIMPLE MASTECTOMY WITH AXILLARY SENTINEL NODE BIOPSY Left 05/18/2012   Procedure: SIMPLE MASTECTOMY WITH AXILLARY SENTINEL NODE BIOPSY;  Surgeon: Adin Hector, MD;  Location: [place];  Service: General;  Laterality: Left;    Prior to Admission medications   Medication Sig Start Date End Date Taking? Authorizing Provider  amLODipine (NORVASC) 5 MG tablet TAKE 1 TABLET BY MOUTH DAILY. 09/27/17  Yes [name], MD  Ascorbic Acid (VITAMIN C) 100 MG tablet Take 100 mg by mouth daily.     Yes [provider]  Calcium Carbonate-Vitamin D (CALTRATE 600+D PO) Take 1 tablet by mouth daily.   Yes [provider]  diazepam (VALIUM) 10 MG tablet Take 1 tablet (10 mg total) by mouth at bedtime as needed for anxiety. 09/18/17  Yes [name], MD  esomeprazole (NEXIUM) 40 MG capsule Take 40 mg by mouth daily before breakfast.   Yes [provider]  exemestane (AROMASIN) 25 MG tablet TAKE 1 TABLET BY MOUTH ONCE DAILY AFTER BREAKFAST 08/10/17  Yes Nicholas Lose, MD  goserelin (ZOLADEX) 3.6 MG injection Inject 3.6 mg into the skin every 28 (twenty-eight) days.   Yes [provider]  loperamide (IMODIUM A-D) 2 MG tablet Take 2 mg by mouth 4 (four) times daily as needed for diarrhea or loose stools.    Yes [provider]  losartan-hydrochlorothiazide (HYZAAR) 50-12.5 MG tablet Take 1 tablet by mouth daily. 08/25/17  Yes [name], MD  Multiple Vitamin (MULTIVITAMIN) tablet Take 1 tablet by mouth daily.     Yes [provider]  traZODone (DESYREL) 50 MG tablet Take 0.5-1 tablets (25-50 mg total)  by mouth at bedtime as needed for sleep. 09/18/17  Yes [name], MD  vitamin B-12 (CYANOCOBALAMIN) 1000 MCG tablet Take 1,000 mcg by mouth daily.   Yes [provider]    Allergies as of 10/02/2017 - Review Complete 09/18/2017  Allergen Reaction Noted  . Adhesive [tape] Other (See Comments) 05/10/2012    Family History  Problem Relation Age of Onset  . Hypertension Mother   . Hypertension Maternal Grandmother   . Pancreatic cancer Paternal Grandmother        diagnosed in her 72s  . Ovarian cancer Maternal Aunt        maternal grandmother's sister  . Breast cancer Paternal Aunt        diagnosed in late 79s early 64s  . Parkinsonism Paternal Grandfather     Social History   Socioeconomic History  . Marital status: Married    Spouse name: Not on file  . Number of children: Not on file  . Years of education: Not on file  . Highest education level: Not on file  Occupational History  . Occupation: Network engineer II    Employer: [employer]  Social Needs  . Financial resource strain: Not on file  . Food insecurity:    Worry: Not on file    Inability: Not on file  . Transportation needs:    Medical: Not on file    Non-medical: Not on file  Tobacco Use  . Smoking status: Former Smoker    Packs/day: 0.50    Last attempt to quit: 03/21/1997    Years since quitting: 20.5  . Smokeless tobacco: Never Used  Substance and Sexual Activity  . Alcohol use: Yes    Alcohol/week: 0.0 oz    Comment: occasionally wine  . Drug use: No  . Sexual activity: Yes    Birth control/protection: None  Lifestyle  . Physical activity:    Days per week: Not on file    Minutes per session: Not on file  . Stress: Not on file  Relationships  . Social connections:    Talks on phone: Not on file    Gets together: Not on file    Attends religious service: Not on file    Active member of club or organization: Not on file    Attends meetings of clubs or organizations: Not on file     Relationship status: Not on file  . Intimate partner violence:    Fear of current or ex partner: Not on  file    Emotionally abused: Not on file    Physically abused: Not on file    Forced sexual activity: Not on file  Other Topics Concern  . Not on file  Social History Narrative   Married to [employer]  No children 37 years old with fmp.  20 year use of birth control pills.      Review of Systems: See HPI, otherwise negative ROS  Physical Exam: BP (!) 155/80   Pulse 69   Temp (!) 96.7 F (35.9 C) (Tympanic)   Resp 16   Ht 5\' 4"  (1.626 m)   Wt 160 lb (72.6 kg)   LMP 11/28/2011   SpO2 100%   BMI 27.46 kg/m  General:   Alert,  pleasant and cooperative in NAD Head:  Normocephalic and atraumatic. Neck:  Supple; no masses or thyromegaly. Lungs:  Clear throughout to auscultation.    Heart:  Regular rate and rhythm. Abdomen:  Soft, nontender and nondistended. Normal bowel sounds, without guarding, and without rebound.   Neurologic:  Alert and  oriented x4;  grossly normal neurologically.  Impression/Plan: Mary [name] is now here to undergo a screening colonoscopy.  Risks, benefits, and alternatives regarding colonoscopy have been reviewed with the patient.  Questions have been answered.  All parties agreeable.

## 2017-10-10 NOTE — Anesthesia Postprocedure Evaluation (Signed)
Anesthesia Post Note  Patient: Mary [name]  Procedure(s) Performed: COLONOSCOPY WITH PROPOFOL (N/A )  Patient location during evaluation: PACU Anesthesia Type: General Level of consciousness: awake and alert Pain management: pain level controlled Vital Signs Assessment: post-procedure vital signs reviewed and stable Respiratory status: spontaneous breathing, nonlabored ventilation, respiratory function stable and patient connected to nasal cannula oxygen Cardiovascular status: blood pressure returned to baseline and stable Postop Assessment: no apparent nausea or vomiting Anesthetic complications: no     Last Vitals:  Vitals:   10/10/17 0948 10/10/17 1030  BP: (!) 155/80 114/62  Pulse: 69 68  Resp: 16 15  Temp: (!) 35.9 C (!) 36.1 C  SpO2: 100% 95%    Last Pain:  Vitals:   10/10/17 1030  TempSrc: Tympanic  PainSc:                  [name]

## 2017-10-10 NOTE — Transfer of Care (Signed)
Immediate Anesthesia Transfer of Care Note  Patient: Mary [name]  Procedure(s) Performed: COLONOSCOPY WITH PROPOFOL (N/A )  Patient Location: PACU and Endoscopy Unit  Anesthesia Type:General  Level of Consciousness: drowsy  Airway & Oxygen Therapy: Patient Spontanous Breathing and Patient connected to nasal cannula oxygen  Post-op Assessment: Report given to RN and Post -op Vital signs reviewed and stable  Post vital signs: Reviewed and stable  Last Vitals:  Vitals Value Taken Time  BP    Temp    Pulse 67 10/10/2017 10:40 AM  Resp 15 10/10/2017 10:40 AM  SpO2 95 % 10/10/2017 10:40 AM  Vitals shown include unvalidated device data.  Last Pain:  Vitals:   10/10/17 0948  TempSrc: Tympanic  PainSc: 0-No pain         Complications: No apparent anesthesia complications

## 2017-10-10 NOTE — Anesthesia Preprocedure Evaluation (Signed)
Anesthesia Evaluation  Patient identified by MRN, date of birth, ID band Patient awake    Reviewed: Allergy & Precautions, H&P , NPO status , Patient's Chart, lab work & pertinent test results, reviewed documented beta blocker date and time   History of Anesthesia Complications (+) history of anesthetic complications  Airway Mallampati: II   Neck ROM: full    Dental  (+) Teeth Intact   Pulmonary neg pulmonary ROS, former smoker,    Pulmonary exam normal        Cardiovascular Exercise Tolerance: Good hypertension, On Medications negative cardio ROS Normal cardiovascular exam Rhythm:regular Rate:Normal     Neuro/Psych PSYCHIATRIC DISORDERS Anxiety negative neurological ROS  negative psych ROS   GI/Hepatic negative GI ROS, Neg liver ROS, GERD  ,  Endo/Other  negative endocrine ROS  Renal/GU negative Renal ROS  negative genitourinary   Musculoskeletal   Abdominal   Peds  Hematology negative hematology ROS (+)   Anesthesia Other Findings Past Medical History: No date: Anxiety     Comment:  takes Xanax prn August 2013: Breast cancer [facility])     Comment:  bilateral, er/pr+ No date: Complication of anesthesia     Comment:  pt states she had the shakes extremely bad No date: Cough     Comment:  at night d/t reflux;nothing productive No date: Dental crowns present     Comment:  x 4 No date: GERD (gastroesophageal reflux disease)     Comment:  takes Nexium daily No date: History of bronchitis     Comment:  08/2011 No date: History of colon polyps 08/08/2012: Hot flashes, menopausal No date: IBS (irritable bowel syndrome)     Comment:  immodium prn No date: Insomnia     Comment:  takes Ambien nightly No date: Irritable bowel syndrome 12/15/2011: Neutropenia, drug-induced (HCC) No date: Nocturia No date: Plantar fasciitis 07/03/2012 through 08/18/2038                                                      : S/P  radiation therapy     Comment:  Right chest wall/regional lymph nodes 5040 cGy 28               sessions, right chest wall/mastectomy scar boost 1000 cGy              5 sessions                         No date: Status post chemotherapy     Comment:  FEC 100 starting on 12/08/2011 - 01/18/12/single agent               Taxol x12 weeks from 02/02/2012 through January 2014  No date: Urinary frequency No date: Use of tamoxifen (Nolvadex) No date: Vertigo Past Surgical History: 2008: BREAST LUMPECTOMY     Comment:  left No date: COLONOSCOPY No date: MASTECTOMY 05/18/2012: MASTECTOMY WITH AXILLARY LYMPH NODE DISSECTION; Right     Comment:  Procedure: MASTECTOMY WITH AXILLARY LYMPH NODE               DISSECTION;  Surgeon: Adin Hector, MD;  Location: [place];  Service: General;  Laterality: Right;  Bilateral  Total Mastectomy , right axillary lymph node dissection               left axillary sentinel node biopsy, removal of Porta Cath 05/18/2012: PORT-A-CATH REMOVAL; N/A     Comment:  Procedure: REMOVAL PORT-A-CATH;  Surgeon: Adin Hector, MD;  Location: [place];  Service: General;                Laterality: N/A; 12/02/2011: PORTACATH PLACEMENT     Comment:  Procedure: INSERTION PORT-A-CATH;  Surgeon: Adin Hector, MD;  Location: [place];                Service: General;  Laterality: N/A;  insertion port a               cath with fluoroscopy 05/18/2012: [place]; Left     Comment:  Procedure: SIMPLE MASTECTOMY WITH AXILLARY SENTINEL NODE              BIOPSY;  Surgeon: Adin Hector, MD;  Location: [place];              Service: General;  Laterality: Left; BMI    Body Mass Index:  27.46 kg/m     Reproductive/Obstetrics negative OB ROS                             Anesthesia Physical Anesthesia Plan  ASA: III  Anesthesia Plan: General    Post-op Pain Management:    Induction:   PONV Risk Score and Plan:   Airway Management Planned:   Additional Equipment:   Intra-op Plan:   Post-operative Plan:   Informed Consent: I have reviewed the patients History and Physical, chart, labs and discussed the procedure including the risks, benefits and alternatives for the proposed anesthesia with the patient or authorized representative who has indicated his/her understanding and acceptance.   Dental Advisory Given  Plan Discussed with: CRNA  Anesthesia Plan Comments:         Anesthesia Quick Evaluation

## 2017-10-11 DIAGNOSIS — D126 Benign neoplasm of colon, unspecified: Secondary | ICD-10-CM

## 2017-10-11 LAB — SURGICAL PATHOLOGY

## 2017-10-12 ENCOUNTER — Encounter: Payer: Self-pay | Admitting: Gastroenterology

## 2017-10-13 ENCOUNTER — Inpatient Hospital Stay: Payer: 59 | Attending: Hematology and Oncology

## 2017-10-13 ENCOUNTER — Encounter: Payer: Self-pay | Admitting: Gastroenterology

## 2017-10-13 DIAGNOSIS — Z170 Estrogen receptor positive status [ER+]: Secondary | ICD-10-CM | POA: Insufficient documentation

## 2017-10-13 DIAGNOSIS — Z79818 Long term (current) use of other agents affecting estrogen receptors and estrogen levels: Secondary | ICD-10-CM | POA: Insufficient documentation

## 2017-10-13 DIAGNOSIS — C50412 Malignant neoplasm of upper-outer quadrant of left female breast: Secondary | ICD-10-CM | POA: Insufficient documentation

## 2017-10-13 MED ORDER — GOSERELIN ACETATE 3.6 MG ~~LOC~~ IMPL
DRUG_IMPLANT | SUBCUTANEOUS | Status: AC
  Filled 2017-10-13: qty 3.6

## 2017-10-13 MED ORDER — GOSERELIN ACETATE 3.6 MG ~~LOC~~ IMPL
3.6000 mg | DRUG_IMPLANT | Freq: Once | SUBCUTANEOUS | Status: AC
  Administered 2017-10-13: 3.6 mg via SUBCUTANEOUS

## 2017-10-13 NOTE — Patient Instructions (Signed)
Goserelin injection What is this medicine? GOSERELIN (GOE se rel in) is similar to a hormone found in the body. It lowers the amount of sex hormones that the body makes. Men will have lower testosterone levels and women will have lower estrogen levels while taking this medicine. In men, this medicine is used to treat prostate cancer; the injection is either given once per month or once every 12 weeks. A once per month injection (only) is used to treat women with endometriosis, dysfunctional uterine bleeding, or advanced breast cancer. This medicine may be used for other purposes; ask your health care provider or pharmacist if you have questions. What should I tell my health care provider before I take this medicine? They need to know if you have any of these conditions (some only apply to women): -diabetes -heart disease or previous heart attack -high blood pressure -high cholesterol -kidney disease -osteoporosis or low bone density -problems passing urine -spinal cord injury -stroke -tobacco smoker -an unusual or allergic reaction to goserelin, hormone therapy, other medicines, foods, dyes, or preservatives -pregnant or trying to get pregnant -breast-feeding How should I use this medicine? This medicine is for injection under the skin. It is given by a health care professional in a hospital or clinic setting. Men receive this injection once every 4 weeks or once every 12 weeks. Women will only receive the once every 4 weeks injection. Talk to your pediatrician regarding the use of this medicine in children. Special care may be needed. Overdosage: If you think you have taken too much of this medicine contact a poison control center or emergency room at once. NOTE: This medicine is only for you. Do not share this medicine with others. What if I miss a dose? It is important not to miss your dose. Call your doctor or health care professional if you are unable to keep an appointment. What may  interact with this medicine? -female hormones like estrogen -herbal or dietary supplements like black cohosh, chasteberry, or DHEA -female hormones like testosterone -prasterone This list may not describe all possible interactions. Give your health care provider a list of all the medicines, herbs, non-prescription drugs, or dietary supplements you use. Also tell them if you smoke, drink alcohol, or use illegal drugs. Some items may interact with your medicine. What should I watch for while using this medicine? Visit your doctor or health care professional for regular checks on your progress. Your symptoms may appear to get worse during the first weeks of this therapy. Tell your doctor or healthcare professional if your symptoms do not start to get better or if they get worse after this time. Your bones may get weaker if you take this medicine for a long time. If you smoke or frequently drink alcohol you may increase your risk of bone loss. A family history of osteoporosis, chronic use of drugs for seizures (convulsions), or corticosteroids can also increase your risk of bone loss. Talk to your doctor about how to keep your bones strong. This medicine should stop regular monthly menstration in women. Tell your doctor if you continue to menstrate. Women should not become pregnant while taking this medicine or for 12 weeks after stopping this medicine. Women should inform their doctor if they wish to become pregnant or think they might be pregnant. There is a potential for serious side effects to an unborn child. Talk to your health care professional or pharmacist for more information. Do not breast-feed an infant while taking this medicine. Men should   inform their doctors if they wish to father a child. This medicine may lower sperm counts. Talk to your health care professional or pharmacist for more information. What side effects may I notice from receiving this medicine? Side effects that you should  report to your doctor or health care professional as soon as possible: -allergic reactions like skin rash, itching or hives, swelling of the face, lips, or tongue -bone pain -breathing problems -changes in vision -chest pain -feeling faint or lightheaded, falls -fever, chills -pain, swelling, warmth in the leg -pain, tingling, numbness in the hands or feet -signs and symptoms of low blood pressure like dizziness; feeling faint or lightheaded, falls; unusually weak or tired -stomach pain -swelling of the ankles, feet, hands -trouble passing urine or change in the amount of urine -unusually high or low blood pressure -unusually weak or tired Side effects that usually do not require medical attention (report to your doctor or health care professional if they continue or are bothersome): -change in sex drive or performance -changes in breast size in both males and females -changes in emotions or moods -headache -hot flashes -irritation at site where injected -loss of appetite -skin problems like acne, dry skin -vaginal dryness This list may not describe all possible side effects. Call your doctor for medical advice about side effects. You may report side effects to FDA at 1-800-FDA-1088. Where should I keep my medicine? This drug is given in a hospital or clinic and will not be stored at home. NOTE: This sheet is a summary. It may not cover all possible information. If you have questions about this medicine, talk to your doctor, pharmacist, or health care provider.    2016, Elsevier/Gold Standard. (2013-05-14 11:10:35)  

## 2017-10-20 ENCOUNTER — Ambulatory Visit: Payer: 59

## 2017-11-10 ENCOUNTER — Inpatient Hospital Stay: Payer: 59 | Attending: Hematology and Oncology

## 2017-11-10 ENCOUNTER — Telehealth: Payer: Self-pay | Admitting: Hematology and Oncology

## 2017-11-10 VITALS — BP 158/79 | HR 81 | Temp 98.2°F | Resp 18

## 2017-11-10 DIAGNOSIS — C50412 Malignant neoplasm of upper-outer quadrant of left female breast: Secondary | ICD-10-CM | POA: Diagnosis not present

## 2017-11-10 DIAGNOSIS — C779 Secondary and unspecified malignant neoplasm of lymph node, unspecified: Secondary | ICD-10-CM | POA: Diagnosis not present

## 2017-11-10 DIAGNOSIS — Z79818 Long term (current) use of other agents affecting estrogen receptors and estrogen levels: Secondary | ICD-10-CM | POA: Diagnosis not present

## 2017-11-10 DIAGNOSIS — Z170 Estrogen receptor positive status [ER+]: Secondary | ICD-10-CM | POA: Diagnosis not present

## 2017-11-10 MED ORDER — GOSERELIN ACETATE 3.6 MG ~~LOC~~ IMPL
3.6000 mg | DRUG_IMPLANT | Freq: Once | SUBCUTANEOUS | Status: AC
  Administered 2017-11-10: 3.6 mg via SUBCUTANEOUS

## 2017-11-10 MED ORDER — GOSERELIN ACETATE 3.6 MG ~~LOC~~ IMPL
DRUG_IMPLANT | SUBCUTANEOUS | Status: AC
  Filled 2017-11-10: qty 3.6

## 2017-11-10 NOTE — Telephone Encounter (Signed)
Patient showed here to change her 9/20 appt to 9/23 as she is going to be out of town.  Patient noted date and time change on original calendar.

## 2017-11-21 ENCOUNTER — Ambulatory Visit: Payer: Self-pay

## 2017-11-21 NOTE — Telephone Encounter (Signed)
Pt. Reports Saturday she started with dizziness and vertigo. Also had the sensation of having a migraine that started in her neck. Worse when she looks up or down, or turns. Had this in June and was treated with Prednisone, which "really helped, but it didn't completely go away." Appointment made for tomorrow per Patina.  Reason for Disposition . [1] MODERATE dizziness (e.g., vertigo; feels very unsteady, interferes with normal activities) AND [2] has NOT been evaluated by physician for this  Answer Assessment - Initial Assessment Questions 1. DESCRIPTION: "Describe your dizziness."     Dizzy 2. VERTIGO: "Do you feel like either you or the room is spinning or tilting?"      Yes 3. LIGHTHEADED: "Do you feel lightheaded?" (e.g., somewhat faint, woozy, weak upon standing)     Dizzy 4. SEVERITY: "How bad is it?"  "Can you walk?"   - MILD - Feels unsteady but walking normally.   - MODERATE - Feels very unsteady when walking, but not falling; interferes with normal activities (e.g., school, work) .   - SEVERE - Unable to walk without falling (requires assistance).     Moderate 5. ONSET:  "When did the dizziness begin?"     Started Saturday 6. AGGRAVATING FACTORS: "Does anything make it worse?" (e.g., standing, change in head position)      When she turns or turns her head 7. CAUSE: "What do you think is causing the dizziness?"     Vertigo 8. RECURRENT SYMPTOM: "Have you had dizziness before?" If so, ask: "When was the last time?" "What happened that time?"     Yes - has had this since April 9. OTHER SYMPTOMS: "Do you have any other symptoms?" (e.g., headache, weakness, numbness, vomiting, earache)     Migraine 10. PREGNANCY: "Is there any chance you are pregnant?" "When was your last menstrual period?"       No  Protocols used: DIZZINESS - VERTIGO-A-AH

## 2017-11-21 NOTE — Telephone Encounter (Signed)
Patient has been scheduled to see Dr. Derrel Nip on 11-22-17

## 2017-11-22 ENCOUNTER — Ambulatory Visit (INDEPENDENT_AMBULATORY_CARE_PROVIDER_SITE_OTHER): Payer: 59

## 2017-11-22 ENCOUNTER — Telehealth: Payer: Self-pay | Admitting: Internal Medicine

## 2017-11-22 ENCOUNTER — Ambulatory Visit: Payer: 59 | Admitting: Internal Medicine

## 2017-11-22 ENCOUNTER — Encounter: Payer: Self-pay | Admitting: Internal Medicine

## 2017-11-22 VITALS — BP 172/104 | HR 83 | Temp 98.2°F | Resp 15 | Ht 64.0 in | Wt 162.6 lb

## 2017-11-22 DIAGNOSIS — R748 Abnormal levels of other serum enzymes: Secondary | ICD-10-CM | POA: Diagnosis not present

## 2017-11-22 DIAGNOSIS — R42 Dizziness and giddiness: Secondary | ICD-10-CM

## 2017-11-22 DIAGNOSIS — H8103 Meniere's disease, bilateral: Secondary | ICD-10-CM

## 2017-11-22 DIAGNOSIS — I10 Essential (primary) hypertension: Secondary | ICD-10-CM

## 2017-11-22 DIAGNOSIS — R7303 Prediabetes: Secondary | ICD-10-CM

## 2017-11-22 DIAGNOSIS — M542 Cervicalgia: Secondary | ICD-10-CM | POA: Diagnosis not present

## 2017-11-22 DIAGNOSIS — M4802 Spinal stenosis, cervical region: Secondary | ICD-10-CM | POA: Diagnosis not present

## 2017-11-22 LAB — COMPREHENSIVE METABOLIC PANEL
ALT: 46 U/L — ABNORMAL HIGH (ref 0–35)
AST: 39 U/L — AB (ref 0–37)
Albumin: 3.9 g/dL (ref 3.5–5.2)
Alkaline Phosphatase: 110 U/L (ref 39–117)
BUN: 20 mg/dL (ref 6–23)
CALCIUM: 10.3 mg/dL (ref 8.4–10.5)
CHLORIDE: 105 meq/L (ref 96–112)
CO2: 31 meq/L (ref 19–32)
CREATININE: 1.53 mg/dL — AB (ref 0.40–1.20)
GFR: 37.32 mL/min — ABNORMAL LOW (ref >60.00)
Glucose, Bld: 98 mg/dL (ref 70–99)
POTASSIUM: 4.2 meq/L (ref 3.5–5.1)
SODIUM: 144 meq/L (ref 135–145)
Total Bilirubin: 0.3 mg/dL (ref 0.2–1.2)
Total Protein: 6.9 g/dL (ref 6.0–8.3)

## 2017-11-22 LAB — HEMOGLOBIN A1C: Hgb A1c MFr Bld: 5.9 % (ref 4.6–6.5)

## 2017-11-22 LAB — LIPID PANEL
CHOL/HDL RATIO: 4
Cholesterol: 192 mg/dL (ref 0–200)
HDL: 47.8 mg/dL (ref >39.00)
LDL CALC: 117 mg/dL — AB (ref 0–99)
NonHDL: 144.5
Triglycerides: 138 mg/dL (ref 0.0–149.0)
VLDL: 27.6 mg/dL (ref 0.0–40.0)

## 2017-11-22 LAB — SEDIMENTATION RATE: SED RATE: 51 mm/h — AB (ref 0–30)

## 2017-11-22 MED ORDER — METHOCARBAMOL 500 MG PO TABS: 500.0000 mg | ORAL_TABLET | Freq: Four times a day (QID) | ORAL | 2 refills | Status: DC

## 2017-11-22 MED ORDER — PREDNISONE 10 MG PO TABS: ORAL_TABLET | ORAL | 0 refills | Status: DC

## 2017-11-22 MED ORDER — ONDANSETRON HCL 4 MG PO TABS: 4.0000 mg | ORAL_TABLET | Freq: Three times a day (TID) | ORAL | 0 refills | Status: DC | PRN

## 2017-11-22 NOTE — Patient Instructions (Addendum)
If your home readings are over 324 systolic,  Double the losartan dose,  And let me know .   Continue   5 mg amlodipine  Daily    For the vertigo:  Suspend aleve while taking prednisone for 8 days  Start using steroid nasal spray   Daily   .  Add Afrin for 5 days max   You can add up to 2000 mg of acetominophen (tylenol) every day safely  In divided doses (500 mg every 6 hours  Or 1000 mg every 12 hours.)  Zofran for nausea   Methocarbamol is your muscle relaxer 500 mg every 6 hours as needed    x rays of cervical spine today  To evaluate the neck pain  Vestibular rehab referral made

## 2017-11-22 NOTE — Progress Notes (Signed)
**Note Mary-Identified via Obfuscation** Subjective:  Patient ID: Mary [name], female    DOB: 1960-11-19  Age: 57 y.o. MRN: [id number]  CC: The primary encounter diagnosis was Vertigo. Diagnoses of Neck pain, musculoskeletal, Prediabetes, Essential hypertension, Vertigo, labyrinthine, bilateral, and Bilateral posterior neck pain were also pertinent to this visit.  HPI Mary [name] presents for recurrence of vertigo and headache,  Patient describes head pressure, intermittent vertigo  and persistent light headedness. .  Vertigo is not present at rest and occurs with position change.   symptoms  Started over the weekend .  Neck feels tense,  And also notes some vision changes. She has some sinus symptoms currently. Wakes up coughing at night.  Feels congested in the morning.   The first episode started in April/May. Given her history of breast cancer , she underwent an MRI /MRA in late May WITH INTERNAL AUDITORY CANAL IMAGING due to severe headaches, vision changes  and vertigo.   NO MASSES.  Nothing to suggest vasculitis .  Sympotms persisted until  June when I treated her with prednisone and valium.   . Saw [place] in July , told her vision was fine.   No temple tenderness .  Diagnosed with labrynthitis in the spring by Tami Ribas but not treated, when symptoms first appeared and she saw neurology  Dr Melrose Nakayama who  referred her to ENT   "rule out Meniere's" . She has tried doing the recommended Semont maneuver (suggested by [employer])  But it has not helped .  She has not been referred for vestibular  Rehab. Wants to see vestibular rehab in [place] has Meniere's disease  Tami Ribas said it was not Meniere's.    BP elevated.  Taking losartan during the day and amlodipine at night   Tubular adenomas x 19 September 2017 colonoscopy   Outpatient Medications Prior to Visit  Medication Sig Dispense Refill  . amLODipine (NORVASC) 5 MG tablet TAKE 1 TABLET BY MOUTH DAILY. 90 tablet 1  . Ascorbic Acid (VITAMIN C) 100 MG tablet Take  100 mg by mouth daily.      . Calcium Carbonate-Vitamin D (CALTRATE 600+D PO) Take 1 tablet by mouth daily.    . diazepam (VALIUM) 10 MG tablet Take 1 tablet (10 mg total) by mouth at bedtime as needed for anxiety. 30 tablet 5  . esomeprazole (NEXIUM) 40 MG capsule Take 40 mg by mouth daily before breakfast.    . exemestane (AROMASIN) 25 MG tablet TAKE 1 TABLET BY MOUTH ONCE DAILY AFTER BREAKFAST 90 tablet 3  . goserelin (ZOLADEX) 3.6 MG injection Inject 3.6 mg into the skin every 28 (twenty-eight) days.    Marland Kitchen loperamide (IMODIUM A-D) 2 MG tablet Take 2 mg by mouth 4 (four) times daily as needed for diarrhea or loose stools.     Marland Kitchen losartan-hydrochlorothiazide (HYZAAR) 50-12.5 MG tablet Take 1 tablet by mouth daily. 90 tablet 0  . Multiple Vitamin (MULTIVITAMIN) tablet Take 1 tablet by mouth daily.      . traZODone (DESYREL) 50 MG tablet Take 0.5-1 tablets (25-50 mg total) by mouth at bedtime as needed for sleep. 90 tablet 1  . vitamin B-12 (CYANOCOBALAMIN) 1000 MCG tablet Take 1,000 mcg by mouth daily.     No facility-administered medications prior to visit.     Review of Systems;  Patient denies headache, fevers, malaise, unintentional weight loss, skin rash, eye pain, sinus congestion and sinus pain, sore throat, dysphagia,  hemoptysis , cough, dyspnea, wheezing, chest pain, palpitations,  orthopnea, edema, abdominal pain, nausea, melena, diarrhea, constipation, flank pain, dysuria, hematuria, urinary  Frequency, nocturia, numbness, tingling, seizures,  Focal weakness, Loss of consciousness,  Tremor, insomnia, depression, anxiety, and suicidal ideation.      Objective:  BP (!) 172/104 (BP Location: Left Arm, Patient Position: Sitting, Cuff Size: Normal)   Pulse 83   Temp 98.2 F (36.8 C) (Oral)   Resp 15   Ht '5\' 4"'$  (1.626 m)   Wt 162 lb 9.6 oz (73.8 kg)   LMP 11/28/2011   SpO2 94%   BMI 27.91 kg/m   BP Readings from Last 3 Encounters:  11/22/17 (!) 172/104  11/10/17 (!) 158/79    10/10/17 114/62    Wt Readings from Last 3 Encounters:  11/22/17 162 lb 9.6 oz (73.8 kg)  10/10/17 160 lb (72.6 kg)  09/18/17 160 lb 12.8 oz (72.9 kg)    General appearance: alert, cooperative and appears stated age Ears: normal TM's and external ear canals both ears Throat: lips, mucosa, and tongue normal; teeth and gums normal Neck: no adenopathy, no carotid bruit, supple, symmetrical, trachea midline and thyroid not enlarged, symmetric, no tenderness/mass/nodules Back: symmetric, no curvature. ROM normal. No CVA tenderness. Lungs: clear to auscultation bilaterally Heart: regular rate and rhythm, S1, S2 normal, no murmur, click, rub or gallop Abdomen: soft, non-tender; bowel sounds normal; no masses,  no organomegaly Pulses: 2+ and symmetric Skin: Skin color, texture, turgor normal. No rashes or lesions Lymph nodes: Cervical, supraclavicular, and axillary nodes normal.  Lab Results  Component Value Date   HGBA1C 5.9 11/22/2017   HGBA1C 6.2 08/03/2017   HGBA1C 6.0 01/18/2017    Lab Results  Component Value Date   CREATININE 1.53 (H) 11/22/2017   CREATININE 0.83 08/03/2017   CREATININE 0.77 01/18/2017    Lab Results  Component Value Date   WBC 7.3 01/18/2017   HGB 15.0 01/18/2017   HCT 45.6 01/18/2017   PLT 264.0 01/18/2017   GLUCOSE 98 11/22/2017   CHOL 192 11/22/2017   TRIG 138.0 11/22/2017   HDL 47.80 11/22/2017   LDLDIRECT 140.6 01/18/2011   LDLCALC 117 (H) 11/22/2017   ALT 46 (H) 11/22/2017   AST 39 (H) 11/22/2017   NA 144 11/22/2017   K 4.2 11/22/2017   CL 105 11/22/2017   CREATININE 1.53 (H) 11/22/2017   BUN 20 11/22/2017   CO2 31 11/22/2017   TSH 2.68 01/18/2017   HGBA1C 5.9 11/22/2017   MICROALBUR 2.8 (H) 08/03/2017    No results found.  Assessment & Plan:   Problem List Items Addressed This Visit    Bilateral posterior neck pain    Plain films show degenerative changes at the C5-6 level with retrolisthesis.  She has no pain radiating to  either arm.  No further workup at this time.       Hypertension    Advised to double losartan dose if home readings are > 130/80 consistently       Prediabetes   Vertigo, labyrinthine, bilateral    Recurrent,  With ENT and Neurology evaluations after first episode  ,  Meniere's.  Brain mets  , vasculitis ruled out the first time.  Given elevated ESR  Will treat for sinusitis with empiric abx,  prednisone taper, refill valium and refer to [facility]   Lab Results  Component Value Date   ESRSEDRATE 51 (H) 11/22/2017          Other Visit Diagnoses    Vertigo    -  Primary  Relevant Orders   Sedimentation rate (Completed)   Ambulatory referral to Physical Therapy   Neck pain, musculoskeletal       Relevant Orders   DG Cervical Spine Complete (Completed)    A total of 40 minutes was spent with patient more than half of which was spent in counseling patient on the above mentioned issues , reviewing and explaining recent labs and imaging studies done, and coordination of care.  I have discontinued Joelene Millin A. Sammarco's vitamin B-12. I am also having her start on methocarbamol, predniSONE, and ondansetron. Additionally, I am having her maintain her vitamin C, multivitamin, loperamide, Calcium Carbonate-Vitamin D (CALTRATE 600+D PO), esomeprazole, goserelin, exemestane, losartan-hydrochlorothiazide, diazepam, traZODone, and amLODipine.  Meds ordered this encounter  Medications  . methocarbamol (ROBAXIN) 500 MG tablet    Sig: Take 1 tablet (500 mg total) by mouth 4 (four) times daily.    Dispense:  90 tablet    Refill:  2  . predniSONE (DELTASONE) 10 MG tablet    Sig: 6 tablets daily for 3 days, then reduce by 1 tablet daily until gone    Dispense:  21 tablet    Refill:  0  . ondansetron (ZOFRAN) 4 MG tablet    Sig: Take 1 tablet (4 mg total) by mouth every 8 (eight) hours as needed for nausea or vomiting.    Dispense:  20 tablet    Refill:  0    Medications Discontinued  During This Encounter  Medication Reason  . vitamin B-12 (CYANOCOBALAMIN) 1000 MCG tablet Patient has not taken in last 30 days    Follow-up: No follow-ups on file.   [name], MD

## 2017-11-22 NOTE — Telephone Encounter (Signed)
Copied from [place] [id number]. Topic: Quick Communication - Rx Refill/Question >> Nov 22, 2017  1:06 PM Burchel, Abbi R wrote: Medication: predniSONE (DELTASONE) 10 MG tablet  Pt's husband states that according to the directions, pt will need to take 33 tablets total.  Rx was only written for 21.  Please send rx for additional tabs to:   Preferred Pharmacy: [facility] [address] [place] [address] Phone: [phone] Fax: [phone]   Please advise pt when completed:  [phone]

## 2017-11-22 NOTE — Telephone Encounter (Signed)
Copied from [place] [id number]. Topic: General - Other >> Nov 22, 2017 12:48 PM Keene Breath wrote: Reason for CRM: Webb Silversmith with [facility] called to get clarification on patient's medicine, Prednisone.  Please call pharmacy to clarify the # of pills to be filled.  CB# [phone].

## 2017-11-23 NOTE — Telephone Encounter (Signed)
Spoke with [place] and gave them the correct quantity for the prednisone taper based off of how Dr. Derrel Nip told pt to take per office note from yesterday.

## 2017-11-24 ENCOUNTER — Ambulatory Visit: Payer: 59

## 2017-11-24 DIAGNOSIS — M542 Cervicalgia: Secondary | ICD-10-CM | POA: Insufficient documentation

## 2017-11-24 DIAGNOSIS — R748 Abnormal levels of other serum enzymes: Secondary | ICD-10-CM | POA: Insufficient documentation

## 2017-11-24 NOTE — Assessment & Plan Note (Signed)
Advised to double losartan dose if home readings are > 130/80 consistently

## 2017-11-24 NOTE — Assessment & Plan Note (Signed)
Plain films show degenerative changes at the C5-6 level with retrolisthesis.  She has no pain radiating to either arm.  No further workup at this time.

## 2017-11-24 NOTE — Assessment & Plan Note (Signed)
Mild, new onset  Etiology unclear.  Will repeat in 2 weeks

## 2017-11-24 NOTE — Assessment & Plan Note (Signed)
Recurrent,  With ENT and Neurology evaluations after first episode  ,  Meniere's.  Brain mets  , vasculitis ruled out the first time.  Given elevated ESR  Will treat for sinusitis with empiric abx,  prednisone taper, refill valium and refer to [facility]   Lab Results  Component Value Date   ESRSEDRATE 51 (H) 11/22/2017

## 2017-11-24 NOTE — Assessment & Plan Note (Signed)
Improving A1c with carefull attention to diet.   Lab Results  Component Value Date   HGBA1C 5.9 11/22/2017

## 2017-12-08 ENCOUNTER — Ambulatory Visit: Payer: 59

## 2017-12-11 ENCOUNTER — Inpatient Hospital Stay: Payer: 59 | Attending: Hematology and Oncology

## 2017-12-11 VITALS — HR 97 | Temp 98.5°F | Resp 16

## 2017-12-11 DIAGNOSIS — C50412 Malignant neoplasm of upper-outer quadrant of left female breast: Secondary | ICD-10-CM | POA: Diagnosis not present

## 2017-12-11 MED ORDER — GOSERELIN ACETATE 3.6 MG ~~LOC~~ IMPL
DRUG_IMPLANT | SUBCUTANEOUS | Status: AC
  Filled 2017-12-11: qty 3.6

## 2017-12-11 MED ORDER — GOSERELIN ACETATE 3.6 MG ~~LOC~~ IMPL
3.6000 mg | DRUG_IMPLANT | Freq: Once | SUBCUTANEOUS | Status: AC
  Administered 2017-12-11: 3.6 mg via SUBCUTANEOUS

## 2017-12-11 NOTE — Patient Instructions (Signed)
Goserelin injection What is this medicine? GOSERELIN (GOE se rel in) is similar to a hormone found in the body. It lowers the amount of sex hormones that the body makes. Men will have lower testosterone levels and women will have lower estrogen levels while taking this medicine. In men, this medicine is used to treat prostate cancer; the injection is either given once per month or once every 12 weeks. A once per month injection (only) is used to treat women with endometriosis, dysfunctional uterine bleeding, or advanced breast cancer. This medicine may be used for other purposes; ask your health care provider or pharmacist if you have questions. COMMON BRAND NAME(S): Zoladex What should I tell my health care provider before I take this medicine? They need to know if you have any of these conditions (some only apply to women): -diabetes -heart disease or previous heart attack -high blood pressure -high cholesterol -kidney disease -osteoporosis or low bone density -problems passing urine -spinal cord injury -stroke -tobacco smoker -an unusual or allergic reaction to goserelin, hormone therapy, other medicines, foods, dyes, or preservatives -pregnant or trying to get pregnant -breast-feeding How should I use this medicine? This medicine is for injection under the skin. It is given by a health care professional in a hospital or clinic setting. Men receive this injection once every 4 weeks or once every 12 weeks. Women will only receive the once every 4 weeks injection. Talk to your pediatrician regarding the use of this medicine in children. Special care may be needed. Overdosage: If you think you have taken too much of this medicine contact a poison control center or emergency room at once. NOTE: This medicine is only for you. Do not share this medicine with others. What if I miss a dose? It is important not to miss your dose. Call your doctor or health care professional if you are unable to  keep an appointment. What may interact with this medicine? -female hormones like estrogen -herbal or dietary supplements like black cohosh, chasteberry, or DHEA -female hormones like testosterone -prasterone This list may not describe all possible interactions. Give your health care provider a list of all the medicines, herbs, non-prescription drugs, or dietary supplements you use. Also tell them if you smoke, drink alcohol, or use illegal drugs. Some items may interact with your medicine. What should I watch for while using this medicine? Visit your doctor or health care professional for regular checks on your progress. Your symptoms may appear to get worse during the first weeks of this therapy. Tell your doctor or healthcare professional if your symptoms do not start to get better or if they get worse after this time. Your bones may get weaker if you take this medicine for a long time. If you smoke or frequently drink alcohol you may increase your risk of bone loss. A family history of osteoporosis, chronic use of drugs for seizures (convulsions), or corticosteroids can also increase your risk of bone loss. Talk to your doctor about how to keep your bones strong. This medicine should stop regular monthly menstration in women. Tell your doctor if you continue to menstrate. Women should not become pregnant while taking this medicine or for 12 weeks after stopping this medicine. Women should inform their doctor if they wish to become pregnant or think they might be pregnant. There is a potential for serious side effects to an unborn child. Talk to your health care professional or pharmacist for more information. Do not breast-feed an infant while taking   this medicine. Men should inform their doctors if they wish to father a child. This medicine may lower sperm counts. Talk to your health care professional or pharmacist for more information. What side effects may I notice from receiving this  medicine? Side effects that you should report to your doctor or health care professional as soon as possible: -allergic reactions like skin rash, itching or hives, swelling of the face, lips, or tongue -bone pain -breathing problems -changes in vision -chest pain -feeling faint or lightheaded, falls -fever, chills -pain, swelling, warmth in the leg -pain, tingling, numbness in the hands or feet -signs and symptoms of low blood pressure like dizziness; feeling faint or lightheaded, falls; unusually weak or tired -stomach pain -swelling of the ankles, feet, hands -trouble passing urine or change in the amount of urine -unusually high or low blood pressure -unusually weak or tired Side effects that usually do not require medical attention (report to your doctor or health care professional if they continue or are bothersome): -change in sex drive or performance -changes in breast size in both males and females -changes in emotions or moods -headache -hot flashes -irritation at site where injected -loss of appetite -skin problems like acne, dry skin -vaginal dryness This list may not describe all possible side effects. Call your doctor for medical advice about side effects. You may report side effects to FDA at 1-800-FDA-1088. Where should I keep my medicine? This drug is given in a hospital or clinic and will not be stored at home. NOTE: This sheet is a summary. It may not cover all possible information. If you have questions about this medicine, talk to your doctor, pharmacist, or health care provider.  2018 Elsevier/Gold Standard (2013-05-14 11:10:35)  

## 2017-12-13 DIAGNOSIS — H8121 Vestibular neuronitis, right ear: Secondary | ICD-10-CM | POA: Diagnosis not present

## 2017-12-13 DIAGNOSIS — H8113 Benign paroxysmal vertigo, bilateral: Secondary | ICD-10-CM | POA: Diagnosis not present

## 2017-12-15 ENCOUNTER — Other Ambulatory Visit (INDEPENDENT_AMBULATORY_CARE_PROVIDER_SITE_OTHER): Payer: 59

## 2017-12-15 DIAGNOSIS — H8103 Meniere's disease, bilateral: Secondary | ICD-10-CM

## 2017-12-15 DIAGNOSIS — R748 Abnormal levels of other serum enzymes: Secondary | ICD-10-CM

## 2017-12-15 LAB — BASIC METABOLIC PANEL
CREATININE: 1.6 — AB (ref 0.5–1.1)
Glucose: 122
POTASSIUM: 3.2 — AB (ref 3.4–5.3)
Sodium: 138 (ref 137–147)

## 2017-12-15 LAB — HEPATIC FUNCTION PANEL
ALT: 37 — AB (ref 7–35)
AST: 36 — AB (ref 13–35)
Bilirubin, Direct: 0.3 (ref 0.01–0.4)

## 2017-12-15 NOTE — Addendum Note (Signed)
Addended by: Leeanne Rio on: 12/15/2017 03:24 PM   Modules accepted: Orders

## 2017-12-16 ENCOUNTER — Other Ambulatory Visit: Payer: Self-pay

## 2017-12-16 ENCOUNTER — Encounter: Payer: Self-pay | Admitting: Internal Medicine

## 2017-12-16 ENCOUNTER — Inpatient Hospital Stay
Admission: EM | Admit: 2017-12-16 | Discharge: 2017-12-22 | DRG: 657 | Disposition: A | Discharge status: Home / Self Care | Payer: 59 | Attending: Internal Medicine | Admitting: Internal Medicine

## 2017-12-16 ENCOUNTER — Inpatient Hospital Stay: Payer: 59

## 2017-12-16 ENCOUNTER — Emergency Department: Payer: 59

## 2017-12-16 DIAGNOSIS — J90 Pleural effusion, not elsewhere classified: Secondary | ICD-10-CM | POA: Diagnosis present

## 2017-12-16 DIAGNOSIS — Z803 Family history of malignant neoplasm of breast: Secondary | ICD-10-CM

## 2017-12-16 DIAGNOSIS — C786 Secondary malignant neoplasm of retroperitoneum and peritoneum: Secondary | ICD-10-CM | POA: Diagnosis present

## 2017-12-16 DIAGNOSIS — R600 Localized edema: Secondary | ICD-10-CM

## 2017-12-16 DIAGNOSIS — C7951 Secondary malignant neoplasm of bone: Secondary | ICD-10-CM | POA: Diagnosis not present

## 2017-12-16 DIAGNOSIS — Z9109 Other allergy status, other than to drugs and biological substances: Secondary | ICD-10-CM

## 2017-12-16 DIAGNOSIS — I82442 Acute embolism and thrombosis of left tibial vein: Secondary | ICD-10-CM | POA: Diagnosis not present

## 2017-12-16 DIAGNOSIS — K219 Gastro-esophageal reflux disease without esophagitis: Secondary | ICD-10-CM | POA: Diagnosis present

## 2017-12-16 DIAGNOSIS — F419 Anxiety disorder, unspecified: Secondary | ICD-10-CM | POA: Diagnosis present

## 2017-12-16 DIAGNOSIS — J9811 Atelectasis: Secondary | ICD-10-CM | POA: Diagnosis present

## 2017-12-16 DIAGNOSIS — E876 Hypokalemia: Secondary | ICD-10-CM | POA: Diagnosis present

## 2017-12-16 DIAGNOSIS — Z23 Encounter for immunization: Secondary | ICD-10-CM

## 2017-12-16 DIAGNOSIS — N1330 Unspecified hydronephrosis: Secondary | ICD-10-CM

## 2017-12-16 DIAGNOSIS — R0602 Shortness of breath: Secondary | ICD-10-CM

## 2017-12-16 DIAGNOSIS — D649 Anemia, unspecified: Secondary | ICD-10-CM | POA: Diagnosis not present

## 2017-12-16 DIAGNOSIS — M722 Plantar fascial fibromatosis: Secondary | ICD-10-CM | POA: Diagnosis present

## 2017-12-16 DIAGNOSIS — I82432 Acute embolism and thrombosis of left popliteal vein: Secondary | ICD-10-CM | POA: Diagnosis present

## 2017-12-16 DIAGNOSIS — R531 Weakness: Secondary | ICD-10-CM | POA: Diagnosis not present

## 2017-12-16 DIAGNOSIS — Z8041 Family history of malignant neoplasm of ovary: Secondary | ICD-10-CM | POA: Diagnosis not present

## 2017-12-16 DIAGNOSIS — N19 Unspecified kidney failure: Secondary | ICD-10-CM | POA: Diagnosis not present

## 2017-12-16 DIAGNOSIS — Z79899 Other long term (current) drug therapy: Secondary | ICD-10-CM

## 2017-12-16 DIAGNOSIS — G4700 Insomnia, unspecified: Secondary | ICD-10-CM | POA: Diagnosis present

## 2017-12-16 DIAGNOSIS — Z86010 Personal history of colonic polyps: Secondary | ICD-10-CM

## 2017-12-16 DIAGNOSIS — C50911 Malignant neoplasm of unspecified site of right female breast: Secondary | ICD-10-CM | POA: Diagnosis not present

## 2017-12-16 DIAGNOSIS — I824Z2 Acute embolism and thrombosis of unspecified deep veins of left distal lower extremity: Secondary | ICD-10-CM | POA: Diagnosis not present

## 2017-12-16 DIAGNOSIS — C7911 Secondary malignant neoplasm of bladder: Secondary | ICD-10-CM | POA: Diagnosis not present

## 2017-12-16 DIAGNOSIS — I10 Essential (primary) hypertension: Secondary | ICD-10-CM | POA: Diagnosis present

## 2017-12-16 DIAGNOSIS — C50919 Malignant neoplasm of unspecified site of unspecified female breast: Secondary | ICD-10-CM | POA: Diagnosis not present

## 2017-12-16 DIAGNOSIS — C801 Malignant (primary) neoplasm, unspecified: Secondary | ICD-10-CM | POA: Diagnosis not present

## 2017-12-16 DIAGNOSIS — H8309 Labyrinthitis, unspecified ear: Secondary | ICD-10-CM | POA: Diagnosis present

## 2017-12-16 DIAGNOSIS — R188 Other ascites: Secondary | ICD-10-CM | POA: Diagnosis present

## 2017-12-16 DIAGNOSIS — Z923 Personal history of irradiation: Secondary | ICD-10-CM | POA: Diagnosis not present

## 2017-12-16 DIAGNOSIS — R42 Dizziness and giddiness: Secondary | ICD-10-CM | POA: Diagnosis not present

## 2017-12-16 DIAGNOSIS — Z79890 Hormone replacement therapy (postmenopausal): Secondary | ICD-10-CM

## 2017-12-16 DIAGNOSIS — N179 Acute kidney failure, unspecified: Secondary | ICD-10-CM | POA: Diagnosis not present

## 2017-12-16 DIAGNOSIS — N135 Crossing vessel and stricture of ureter without hydronephrosis: Secondary | ICD-10-CM | POA: Diagnosis not present

## 2017-12-16 DIAGNOSIS — Z87891 Personal history of nicotine dependence: Secondary | ICD-10-CM

## 2017-12-16 DIAGNOSIS — E875 Hyperkalemia: Secondary | ICD-10-CM | POA: Diagnosis present

## 2017-12-16 DIAGNOSIS — Z853 Personal history of malignant neoplasm of breast: Secondary | ICD-10-CM | POA: Diagnosis not present

## 2017-12-16 DIAGNOSIS — N138 Other obstructive and reflux uropathy: Secondary | ICD-10-CM | POA: Diagnosis present

## 2017-12-16 DIAGNOSIS — D494 Neoplasm of unspecified behavior of bladder: Secondary | ICD-10-CM | POA: Diagnosis not present

## 2017-12-16 DIAGNOSIS — Z9221 Personal history of antineoplastic chemotherapy: Secondary | ICD-10-CM

## 2017-12-16 DIAGNOSIS — N329 Bladder disorder, unspecified: Secondary | ICD-10-CM | POA: Diagnosis not present

## 2017-12-16 DIAGNOSIS — Z9013 Acquired absence of bilateral breasts and nipples: Secondary | ICD-10-CM

## 2017-12-16 DIAGNOSIS — K589 Irritable bowel syndrome without diarrhea: Secondary | ICD-10-CM | POA: Diagnosis present

## 2017-12-16 DIAGNOSIS — N131 Hydronephrosis with ureteral stricture, not elsewhere classified: Secondary | ICD-10-CM | POA: Diagnosis present

## 2017-12-16 DIAGNOSIS — I82452 Acute embolism and thrombosis of left peroneal vein: Secondary | ICD-10-CM | POA: Diagnosis present

## 2017-12-16 DIAGNOSIS — Z170 Estrogen receptor positive status [ER+]: Secondary | ICD-10-CM | POA: Diagnosis not present

## 2017-12-16 DIAGNOSIS — Z8249 Family history of ischemic heart disease and other diseases of the circulatory system: Secondary | ICD-10-CM

## 2017-12-16 DIAGNOSIS — C50412 Malignant neoplasm of upper-outer quadrant of left female breast: Secondary | ICD-10-CM

## 2017-12-16 LAB — COMPREHENSIVE METABOLIC PANEL
AG RATIO: 1.4 (calc) (ref 1.0–2.5)
ALBUMIN: 3.8 g/dL (ref 3.5–5.0)
ALT: 37 U/L — ABNORMAL HIGH (ref 6–29)
ALT: 43 U/L (ref 0–44)
AST: 36 U/L — AB (ref 10–35)
AST: 39 U/L (ref 15–41)
Albumin: 3.7 g/dL (ref 3.6–5.1)
Alkaline Phosphatase: 110 U/L (ref 38–126)
Alkaline phosphatase (APISO): 112 U/L (ref 33–130)
Anion gap: 9 (ref 5–15)
BUN / CREAT RATIO: 19 (calc) (ref 6–22)
BUN: 30 mg/dL — ABNORMAL HIGH (ref 7–25)
BUN: 30 mg/dL — AB (ref 6–20)
CHLORIDE: 98 mmol/L (ref 98–110)
CO2: 30 mmol/L (ref 20–32)
CO2: 28 mmol/L (ref 22–32)
CREATININE: 1.58 mg/dL — AB (ref 0.50–1.05)
Calcium: 13.1 mg/dL (ref 8.6–10.4)
Calcium: 13.9 mg/dL (ref 8.9–10.3)
Chloride: 102 mmol/L (ref 98–111)
Creatinine, Ser: 1.63 mg/dL — ABNORMAL HIGH (ref 0.44–1.00)
GFR calc Af Amer: 40 mL/min — ABNORMAL LOW (ref >60)
GFR, EST NON AFRICAN AMERICAN: 34 mL/min — AB (ref >60)
GLOBULIN: 2.7 g/dL (ref 1.9–3.7)
GLUCOSE: 122 mg/dL — AB (ref 65–99)
GLUCOSE: 113 mg/dL — AB (ref 70–99)
POTASSIUM: 3.2 mmol/L — AB (ref 3.5–5.3)
POTASSIUM: 3 mmol/L — AB (ref 3.5–5.1)
SODIUM: 138 mmol/L (ref 135–146)
Sodium: 139 mmol/L (ref 135–145)
TOTAL PROTEIN: 6.4 g/dL (ref 6.1–8.1)
Total Bilirubin: 0.3 mg/dL (ref 0.2–1.2)
Total Bilirubin: 0.6 mg/dL (ref 0.3–1.2)
Total Protein: 6.9 g/dL (ref 6.5–8.1)

## 2017-12-16 LAB — PROTIME-INR
INR: 1.01
Prothrombin Time: 13.2 seconds (ref 11.4–15.2)

## 2017-12-16 LAB — PHOSPHORUS: PHOSPHORUS: 3.1 mg/dL (ref 2.5–4.6)

## 2017-12-16 LAB — URINALYSIS, COMPLETE (UACMP) WITH MICROSCOPIC
BACTERIA UA: NONE SEEN
BILIRUBIN URINE: NEGATIVE
Glucose, UA: NEGATIVE mg/dL
Hgb urine dipstick: NEGATIVE
Ketones, ur: NEGATIVE mg/dL
Leukocytes, UA: NEGATIVE
NITRITE: NEGATIVE
Protein, ur: NEGATIVE mg/dL
SPECIFIC GRAVITY, URINE: 1.009 (ref 1.005–1.030)
pH: 6 (ref 5.0–8.0)

## 2017-12-16 LAB — CBC
HCT: 37.5 % (ref 35.0–47.0)
HEMOGLOBIN: 13 g/dL (ref 12.0–16.0)
MCH: 31.7 pg (ref 26.0–34.0)
MCHC: 34.7 g/dL (ref 32.0–36.0)
MCV: 91.3 fL (ref 80.0–100.0)
Platelets: 201 10*3/uL (ref 150–440)
RBC: 4.11 MIL/uL (ref 3.80–5.20)
RDW: 12.1 % (ref 11.5–14.5)
WBC: 7.6 10*3/uL (ref 3.6–11.0)

## 2017-12-16 LAB — CALCIUM: CALCIUM: 12.2 mg/dL — AB (ref 8.9–10.3)

## 2017-12-16 LAB — BASIC METABOLIC PANEL
BUN: 30 — AB (ref 4–21)
Creatinine: 1.6 — AB (ref <=1.1)
Glucose: 122
Potassium: 3.2 — AB (ref 3.4–5.3)
Sodium: 138 (ref 137–147)

## 2017-12-16 LAB — HEPATIC FUNCTION PANEL
ALT: 37 — AB (ref 7–35)
AST: 36 — AB (ref 13–35)

## 2017-12-16 LAB — APTT: aPTT: 27 seconds (ref 24–36)

## 2017-12-16 LAB — HEPARIN LEVEL (UNFRACTIONATED): HEPARIN UNFRACTIONATED: 0.46 [IU]/mL (ref 0.30–0.70)

## 2017-12-16 LAB — SEDIMENTATION RATE: Sed Rate: 29 mm/h (ref 0–30)

## 2017-12-16 LAB — MAGNESIUM: Magnesium: 2.1 mg/dL (ref 1.7–2.4)

## 2017-12-16 MED ORDER — CEFAZOLIN SODIUM-DEXTROSE 2-4 GM/100ML-% IV SOLN
2.0000 g | Freq: Once | INTRAVENOUS | Status: AC
  Administered 2017-12-17: 2 g via INTRAVENOUS
  Filled 2017-12-16 (×2): qty 100

## 2017-12-16 MED ORDER — ACETAMINOPHEN 325 MG PO TABS
650.0000 mg | ORAL_TABLET | Freq: Four times a day (QID) | ORAL | Status: DC | PRN
  Administered 2017-12-19 – 2017-12-22 (×3): 650 mg via ORAL
  Filled 2017-12-16 (×3): qty 2

## 2017-12-16 MED ORDER — AMLODIPINE BESYLATE 5 MG PO TABS
5.0000 mg | ORAL_TABLET | Freq: Every day | ORAL | Status: DC
  Administered 2017-12-16 – 2017-12-22 (×7): 5 mg via ORAL
  Filled 2017-12-16 (×7): qty 1

## 2017-12-16 MED ORDER — SCOPOLAMINE 1 MG/3DAYS TD PT72
1.0000 | MEDICATED_PATCH | TRANSDERMAL | Status: DC
  Administered 2017-12-16 – 2017-12-22 (×3): 1.5 mg via TRANSDERMAL
  Filled 2017-12-16 (×3): qty 1

## 2017-12-16 MED ORDER — TRAZODONE HCL 50 MG PO TABS
25.0000 mg | ORAL_TABLET | Freq: Every evening | ORAL | Status: DC | PRN
  Administered 2017-12-21: 21:00:00 50 mg via ORAL
  Filled 2017-12-16: qty 1

## 2017-12-16 MED ORDER — CALCITONIN (SALMON) 200 UNIT/ML IJ SOLN
4.0000 [IU]/kg | Freq: Once | INTRAMUSCULAR | Status: AC
  Administered 2017-12-16: 290 [IU] via INTRAMUSCULAR
  Filled 2017-12-16: qty 1.45

## 2017-12-16 MED ORDER — ENOXAPARIN SODIUM 80 MG/0.8ML ~~LOC~~ SOLN: 1.0000 mg/kg | Freq: Two times a day (BID) | SUBCUTANEOUS | Status: DC

## 2017-12-16 MED ORDER — PANTOPRAZOLE SODIUM 40 MG PO TBEC
40.0000 mg | DELAYED_RELEASE_TABLET | Freq: Every day | ORAL | Status: DC
  Administered 2017-12-16 – 2017-12-22 (×7): 40 mg via ORAL
  Filled 2017-12-16 (×7): qty 1

## 2017-12-16 MED ORDER — ONDANSETRON HCL 4 MG PO TABS: 4.0000 mg | ORAL_TABLET | Freq: Four times a day (QID) | ORAL | Status: DC | PRN

## 2017-12-16 MED ORDER — HYDROMORPHONE HCL 1 MG/ML IJ SOLN
0.5000 mg | INTRAMUSCULAR | Status: DC | PRN
  Administered 2017-12-17 – 2017-12-19 (×11): 1 mg via INTRAVENOUS
  Administered 2017-12-19: 0.5 mg via INTRAVENOUS
  Administered 2017-12-20 – 2017-12-22 (×5): 1 mg via INTRAVENOUS
  Filled 2017-12-16 (×18): qty 1

## 2017-12-16 MED ORDER — ONDANSETRON HCL 4 MG/2ML IJ SOLN
4.0000 mg | Freq: Four times a day (QID) | INTRAMUSCULAR | Status: DC | PRN
  Administered 2017-12-17 – 2017-12-22 (×4): 4 mg via INTRAVENOUS
  Filled 2017-12-16 (×3): qty 2

## 2017-12-16 MED ORDER — POTASSIUM CHLORIDE 10 MEQ/100ML IV SOLN
10.0000 meq | INTRAVENOUS | Status: AC
  Administered 2017-12-16 (×4): 10 meq via INTRAVENOUS
  Filled 2017-12-16 (×4): qty 100

## 2017-12-16 MED ORDER — EXEMESTANE 25 MG PO TABS
25.0000 mg | ORAL_TABLET | Freq: Every day | ORAL | Status: DC
  Administered 2017-12-17 – 2017-12-21 (×5): 25 mg via ORAL
  Filled 2017-12-16 (×6): qty 1

## 2017-12-16 MED ORDER — ZOLEDRONIC ACID 4 MG/5ML IV CONC
4.0000 mg | Freq: Once | INTRAVENOUS | Status: AC
  Administered 2017-12-16: 4 mg via INTRAVENOUS
  Filled 2017-12-16: qty 5

## 2017-12-16 MED ORDER — SODIUM CHLORIDE 0.9 % IV SOLN
INTRAVENOUS | Status: DC
  Administered 2017-12-16 – 2017-12-20 (×8): via INTRAVENOUS

## 2017-12-16 MED ORDER — INFLUENZA VAC SPLIT QUAD 0.5 ML IM SUSY
0.5000 mL | PREFILLED_SYRINGE | INTRAMUSCULAR | Status: DC
  Filled 2017-12-16: qty 0.5

## 2017-12-16 MED ORDER — ACETAMINOPHEN 650 MG RE SUPP: 650.0000 mg | Freq: Four times a day (QID) | RECTAL | Status: DC | PRN

## 2017-12-16 MED ORDER — HEPARIN (PORCINE) IN NACL 100-0.45 UNIT/ML-% IJ SOLN
1400.0000 [IU]/h | INTRAMUSCULAR | Status: DC
  Administered 2017-12-16 – 2017-12-18 (×3): 1100 [IU]/h via INTRAVENOUS
  Administered 2017-12-20: 1400 [IU]/h via INTRAVENOUS
  Administered 2017-12-20: 1100 [IU]/h via INTRAVENOUS
  Administered 2017-12-21: 1400 [IU]/h via INTRAVENOUS
  Filled 2017-12-16 (×7): qty 250

## 2017-12-16 MED ORDER — HEPARIN BOLUS VIA INFUSION
4000.0000 [IU] | Freq: Once | INTRAVENOUS | Status: AC
  Administered 2017-12-16: 4000 [IU] via INTRAVENOUS
  Filled 2017-12-16: qty 4000

## 2017-12-16 MED ORDER — ENOXAPARIN SODIUM 40 MG/0.4ML ~~LOC~~ SOLN: 40.0000 mg | SUBCUTANEOUS | Status: DC

## 2017-12-16 MED ORDER — HEPARIN (PORCINE) IN NACL 100-0.45 UNIT/ML-% IJ SOLN: 10.0000 [IU]/kg/h | INTRAMUSCULAR | Status: DC

## 2017-12-16 MED ORDER — DIAZEPAM 5 MG PO TABS: 10.0000 mg | ORAL_TABLET | Freq: Every evening | ORAL | Status: DC | PRN

## 2017-12-16 MED ORDER — BISACODYL 5 MG PO TBEC
5.0000 mg | DELAYED_RELEASE_TABLET | Freq: Every day | ORAL | Status: DC | PRN
  Administered 2017-12-16 – 2017-12-19 (×3): 5 mg via ORAL
  Filled 2017-12-16 (×4): qty 1

## 2017-12-16 MED ORDER — SODIUM CHLORIDE 0.9 % IV SOLN
Freq: Once | INTRAVENOUS | Status: AC
  Administered 2017-12-16: 06:00:00 via INTRAVENOUS

## 2017-12-16 MED ORDER — SENNOSIDES-DOCUSATE SODIUM 8.6-50 MG PO TABS: 1.0000 | ORAL_TABLET | Freq: Every evening | ORAL | Status: DC | PRN

## 2017-12-16 NOTE — Plan of Care (Signed)

## 2017-12-16 NOTE — Consult Note (Signed)
Urology Consult  I have been asked to see the patient by Dr. Vianne Bulls, for evaluation and management of acute kidney injury, bilateral hydronephrosis.  Chief Complaint: lab normality  History of Present Illness: Mary [name] is a 57 y.o. 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 Lindi Adie admitted with evidence of significant metastatic progression including retroperitoneal and periaortic lymph nodes, concern for carcinomatosis, sclerotic bony metastatic disease of the spine, masslike thickening of the bladder and bilateral ureteral obstruction with moderate hydronephrosis.   Mary [name] was called in early this morning to the emergency room after a critical lab alert of severe hypercalcemia.  Notably, she is been having her labs followed by her primary care which were noted to be abnormal but a month ago with new onset of renal dysfunction and elevated LFTs.   Her creatinine was last normal in 07/2017 with a baseline of 0.83.  It subsequently rose to 1.53 on 11/23/2014 and has been stably elevated since.  She also had mildly elevated LFTs which have since normalized.  Underwent noncontrast CT scan demonstrating the above findings highly concerning for significant progression of her disease.  She is being seen and evaluated by medical oncology today, Dr. Tasia Catchings as an inpatient.  She does endorse over the past several months, she has had dizziness which is evaluated with an MRI of the brain.  In addition to this she said diffuse abdominal pain radiating to her right flank as well as diffuse bone pain which she attributed to her oncologic medications.  She is also had constipation but no significant urinary symptoms.  No gross hematuria.  She does endorse fatigue and malaise.  She is also noted left lower extremity swelling which she attributed  to injuring her ankle, however, lower extremity Doppler does show evidence of a DVT.  She will be started on heparin drip this afternoon.  Past Medical History:  Diagnosis Date  . Anxiety    takes Xanax prn  . Breast cancer [facility]) August 2013   bilateral, er/pr+  . Complication of anesthesia    pt states she had the shakes extremely bad  . Cough    at night d/t reflux;nothing productive  . Dental crowns present    x 4  . GERD (gastroesophageal reflux disease)    takes Nexium daily  . History of bronchitis    08/2011  . History of colon polyps   . Hot flashes, menopausal 08/08/2012  . IBS (irritable bowel syndrome)    immodium prn  . Insomnia    takes Ambien nightly  . Irritable bowel syndrome   . Neutropenia, drug-induced ([place]) 12/15/2011  . Nocturia   . Plantar fasciitis   . S/P radiation therapy 07/03/2012 through 08/18/2038                                                        Right chest wall/regional lymph nodes 5040 cGy 28 sessions, right chest wall/mastectomy scar boost 1000 cGy 5 sessions                          .  Status post chemotherapy    FEC 100 starting on 12/08/2011 - 01/18/12/single agent Taxol x12 weeks from 02/02/2012 through January 2014   . Urinary frequency   . Use of tamoxifen (Nolvadex)   . Vertigo     Past Surgical History:  Procedure Laterality Date  . BREAST LUMPECTOMY  2008   left  . COLONOSCOPY    . COLONOSCOPY WITH PROPOFOL N/A 10/10/2017   Procedure: COLONOSCOPY WITH PROPOFOL;  Surgeon: Lucilla Lame, MD;  Location: [facility] ENDOSCOPY;  Service: Endoscopy;  Laterality: N/A;  . MASTECTOMY    . MASTECTOMY WITH AXILLARY LYMPH NODE DISSECTION Right 05/18/2012   Procedure: MASTECTOMY WITH AXILLARY LYMPH NODE DISSECTION;  Surgeon: Adin Hector, MD;  Location: [place];  Service: General;  Laterality: Right;  Bilateral Total Mastectomy , right axillary lymph node dissection left axillary sentinel node biopsy, removal of Porta Cath  . PORT-A-CATH REMOVAL  N/A 05/18/2012   Procedure: REMOVAL PORT-A-CATH;  Surgeon: Adin Hector, MD;  Location: [place];  Service: General;  Laterality: N/A;  . PORTACATH PLACEMENT  12/02/2011   Procedure: INSERTION PORT-A-CATH;  Surgeon: Adin Hector, MD;  Location: [place];  Service: General;  Laterality: N/A;  insertion port a cath with fluoroscopy  . SIMPLE MASTECTOMY WITH AXILLARY SENTINEL NODE BIOPSY Left 05/18/2012   Procedure: SIMPLE MASTECTOMY WITH AXILLARY SENTINEL NODE BIOPSY;  Surgeon: Adin Hector, MD;  Location: [place];  Service: General;  Laterality: Left;    Home Medications:  Current Meds  Medication Sig  . amLODipine (NORVASC) 5 MG tablet TAKE 1 TABLET BY MOUTH DAILY.  Marland Kitchen Ascorbic Acid (VITAMIN C) 100 MG tablet Take 100 mg by mouth daily.    . Calcium Carbonate-Vitamin D (CALTRATE 600+D PO) Take 1 tablet by mouth daily.  . diazepam (VALIUM) 10 MG tablet Take 1 tablet (10 mg total) by mouth at bedtime as needed for anxiety.  Marland Kitchen esomeprazole (NEXIUM) 40 MG capsule Take 40 mg by mouth daily before breakfast.  . exemestane (AROMASIN) 25 MG tablet TAKE 1 TABLET BY MOUTH ONCE DAILY AFTER BREAKFAST  . goserelin (ZOLADEX) 3.6 MG injection Inject 3.6 mg into the skin every 28 (twenty-eight) days.  Marland Kitchen loperamide (IMODIUM A-D) 2 MG tablet Take 2 mg by mouth 4 (four) times daily as needed for diarrhea or loose stools.   Marland Kitchen losartan-hydrochlorothiazide (HYZAAR) 50-12.5 MG tablet Take 1 tablet by mouth daily.  . methocarbamol (ROBAXIN) 500 MG tablet Take 1 tablet (500 mg total) by mouth 4 (four) times daily.  . Multiple Vitamin (MULTIVITAMIN) tablet Take 1 tablet by mouth daily.    . ondansetron (ZOFRAN) 4 MG tablet Take 1 tablet (4 mg total) by mouth every 8 (eight) hours as needed for nausea or vomiting.  . traZODone (DESYREL) 50 MG tablet Take 0.5-1 tablets (25-50 mg total) by mouth at bedtime as needed for sleep.    Allergies:  Allergies  Allergen Reactions  . Adhesive [Tape] Other  (See Comments)    Redness:Please use paper tape    Family History  Problem Relation Age of Onset  . Hypertension Mother   . Hypertension Maternal Grandmother   . Pancreatic cancer Paternal Grandmother        diagnosed in her 87s  . Ovarian cancer Maternal Aunt        maternal grandmother's sister  . Breast cancer Paternal Aunt        diagnosed in late 21s early 25s  . Parkinsonism Paternal Grandfather     Social  History:  reports that she quit smoking about 20 years ago. She smoked 0.50 packs per day. She has never used smokeless tobacco. She reports that she drinks alcohol. She reports that she does not use drugs.  ROS: A complete review of systems was performed.  All systems are negative except for pertinent findings as noted.  Physical Exam:  Vital signs in last 24 hours: Temp:  [98.1 F (36.7 C)] 98.1 F (36.7 C) (09/28 1225) Pulse Rate:  [81-100] 90 (09/28 1225) Resp:  [13-19] 18 (09/28 1225) BP: (130-161)/(78-85) 138/79 (09/28 1225) SpO2:  [90 %-96 %] 90 % (09/28 1225) Weight:  [72.6 kg] 72.6 kg (09/28 0318) Constitutional:  Alert and oriented, No acute distress.  Husband at bedside. HEENT: [place] AT, moist mucus membranes.  Trachea midline, no masses. Cardiovascular: Regular rate and rhythm, no clubbing, cyanosis,.  Left greater than right lower extremity edema appreciated. Respiratory: Normal respiratory effort, creased work of breathing. GI: Abdomen is soft, nontender, obese,mildly tender.   GU: No CVA tenderness Skin: No rashes, bruises or suspicious lesions Neurologic: Grossly intact, no focal deficits, moving all 4 extremities Psychiatric: Normal mood and affect   Laboratory Data:  Recent Labs    12/16/17 0332  WBC 7.6  HGB 13.0  HCT 37.5   Recent Labs    12/15/17 1525 12/16/17 0332 12/16/17 1438  NA 138 139  --   K 3.2* 3.0*  --   CL 98 102  --   CO2 30 28  --   GLUCOSE 122* 113*  --   BUN 30* 30*  --   CREATININE 1.58* 1.63*  --   CALCIUM 13.1*  13.9* 12.2*   Recent Labs    12/16/17 1343  INR 1.01    Radiologic Imaging: US Venous Img Lower Unilateral Left  Result Date: 12/16/2017 CLINICAL DATA:  Lower extremity edema EXAM: LEFT LOWER EXTREMITY VENOUS DUPLEX ULTRASOUND TECHNIQUE: Doppler venous assessment of the left lower extremity deep venous system was performed, including characterization of spectral flow, compressibility, and phasicity. COMPARISON:  None. FINDINGS: There is complete compressibility of the left common femoral and femoral veins. The left popliteal vein is partially occlusive and contains partially occlusive DVT. Thrombus is mixed echogenicity with areas of hypoechoic and hyperechoic signal. There is occlusive thrombus in the posterior tibial and peroneal veins. Doppler analysis demonstrates respiratory phasicity and augmentation. IMPRESSION: The study is positive for DVT in the popliteal, posterior tibial, and peroneal veins as described. Critical Value/emergent results were called by telephone at the time of interpretation on 12/16/2017 at 9:24 am to Dr. Alfred Levins, who verbally acknowledged these results. Electronically Signed   By: Marybelle Killings M.D.   On: 12/16/2017 09:25   Ct Renal Stone Study  Result Date: 12/16/2017 CLINICAL DATA:  57 year old female with abdominal pain. History of breast cancer and bilateral mastectomy. Patient is on chemo and radiation treatment. EXAM: CT ABDOMEN AND PELVIS WITHOUT CONTRAST TECHNIQUE: Multidetector CT imaging of the abdomen and pelvis was performed following the standard protocol without IV contrast. COMPARISON:  PET-CT dated 12/01/2011 FINDINGS: Evaluation of this exam is limited in the absence of intravenous contrast. Lower chest: Partially visualized small to moderate bilateral pleural effusions with associated partial compressive atelectasis of the adjacent lungs and areas of linear atelectasis/scarring. No intra-abdominal free air. Diffuse mesenteric edema and stranding and small  ascites. Hepatobiliary: Subcentimeter hypodense focus in the right lobe of the liver is not characterized. The liver is otherwise unremarkable on this noncontrast CT. Probable tiny stone or  sludge within the gallbladder. Pancreas: Unremarkable. No pancreatic ductal dilatation or surrounding inflammatory changes. Spleen: Normal in size without focal abnormality. Adrenals/Urinary Tract: Mild bilateral adrenal thickening. There is moderate bilateral hydronephrosis. The ureters are nondistended. The hydronephrosis is likely related to a degree of stricture at the ureteropelvic junctions secondary to retroperitoneal adhesions/fibrosis. No stone identified within the kidneys or along the course of the ureters. There is masslike thickening of the anterior and left lateral bladder wall likely metastatic disease. Stomach/Bowel: There is diffuse thickening of the stomach which may be partly related to underdistention and ascites. Gastritis or an infiltrative process is not entirely excluded. There is no bowel obstruction. The appendix is normal as visualized. Vascular/Lymphatic: Mild aortoiliac atherosclerotic disease. The abdominal aorta and IVC are grossly unremarkable on this noncontrast CT. No portal venous gas. There are multiple somewhat small retroperitoneal and para-aortic lymph nodes. A mildly enlarged right iliac chain node measures 11 mm in short axis. There is diffuse mesenteric and omental nodularity and edema consistent with metastatic implants. Multiple top-normal and mildly rounded mesenteric lymph nodes measure up to 8 mm in short axis. Reproductive: Evaluation of the pelvic structures is very limited. The uterus appears retroflexed. A 3.3 x 3.9 x 2.6 cm ill-defined high attenuating solid lesion to the right of the uterine fundus is not well evaluated and may represent a fibroid or a complex lesion arising from the right adnexa. However, a neoplastic mass is not excluded. Pelvic ultrasound may provide better  evaluation. Other: There is mild diffuse subcutaneous edema of the abdominal wall. Musculoskeletal: Multiple small scattered sclerotic lesions throughout the spine most consistent with metastatic disease. No acute fracture. IMPRESSION: 1. Moderate bilateral hydronephrosis, likely related to strictures at the level of the UPJ's, and likely secondary to retroperitoneal adhesions or fibrosis. No obstructing stone. 2. Masslike thickening of the anterior and left lateral bladder wall suspicious for metastatic implant. 3. Mesenteric, omental, and peritoneal implants with a small ascites and diffuse mesenteric edema. 4. Small to moderate bilateral pleural effusions with associated compressive atelectasis of the lungs. 5. Diffuse small osseous sclerotic metastatic disease. Electronically Signed   By: Anner Crete M.D.   On: 12/16/2017 06:53    Impression/ Plan: 57 year old female with previously diagnosed stage III breast cancer and now with diffuse widely metastatic disease involving retroperitoneum, omentum, bone, possibly liver, possible bladder with bilateral ureteral obstruction.  Acute kidney injury likely secondary to obstructive uropathy.  1.  Moderate bilateral hydroureteronephrosis- we discussed the likely etiology of this being extrinsic compression from metastatic disease.  We discussed management options including observation, ureteral stents versus percutaneous nephrostomy tubes.  We discussed that without decompression of her kidneys with prolonged ureteral obstruction, she can experience irreversible kidney dysfunction.  As such, I would encourage her to consider intervention and urinary decompression to which she is agreeable.  We discussed the options including placement of bilateral indwelling ureteral stent versus bilateral percutaneous nephrostomy tubes.  We discussed the risk and benefits of each in detail.  She is more interested in ureteral stent placement.  She understands the risk of stent  irritation, need for stent exchanges, and the possibility of stent failure amongst others.  All questions were answered.  She would like to have this procedure tomorrow.  N.p.o. at midnight.  Have asked on-call to the OR.  2. AKI- secondary on #1.  Adequate hydration.  Avoid nephrotoxins.  3. LLE DVT-discussed case with Dr. Tasia Catchings.  She will be started on a heparin drip which can be  held perioperatively (6 hours prior to surgery) secondary to possible bladder biopsy, as #4.  She may resume this immediately post op.  4.  Bladder mass-possible intravesical metastatic disease.  If there is perhaps disease within the lumen of the bladder, will proceed with biopsy of this in order to aid in tissue diagnosis and biomarkers to plan for further additional treatment.  We discussed the risk of this as well including risk of bleeding and injury to the bladder.  All questions were answered.  Hollice Espy, MD         12/16/2017, 3:16 PM  Hollice Espy,  MD

## 2017-12-16 NOTE — Consult Note (Signed)
Hematology/Oncology Consult note [facility] Telephone:([phone] [phone]  Patient Care Team: [name], MD as PCP - General (Internal Medicine)   Name of the patient: Mary [name]  [id number]  1960/12/12   Date of visit: 12/16/17 REASON FOR COSULTATION:  Hypercalcemia, history of breast cancer, peritoneal carcinomatosis. History of presenting illness-  57 y.o. female with PMH listed at below who was told by primary care physician to go to emergency room due to abnormal lab values.  Calcium level was 13.9 with creatinine 1.63.  Patient endorses right lower quadrant/right flank/right lower back pain for the past couple of months.  Also having had episodes of dizziness which were diagnosed as lentigo since April 2019.  Patient had MRI brain done at that point which were essentially negative for acute findings, patient follow-up with ENT for canalith repositioning maneuvers.  Reports that she feels dizziness/vertigo have not improved, still has intermittent dizziness episodes.  Patient has a history of breast cancer and follows up with Dr.Gudina Vinay at [facility] in [place]. Reviewed his cancer history. She was diagnosed with right upper quadrant breast in paternal grandmother and paternal aunt in August 2013, status post neoadjuvant chemotherapy with FEC x4 followed by weekly Taxol x2, underwent bilateral mastectomy [February 2014], right ALND, and left sentinel LN biopsy.  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  Tis N0.  Patient also received adjuvant radiation.   Patient was started initially on tamoxifen plus ovarian suppression with Zoladex in June 2014,  and changed to Aromasin plus Zoladex monthly since June 2015.  Also reports that she twisted and sprained her left ankle when getting out of her car on 12/11/2017.  Ankle has been  swollen. In the emergency room, patient had CT renal stone study which reviewed bilateral hydronephrosis, likely related to strictures at the level of the UPJs.  Likely secondary to retroperitoneal adhesions or fibrosis.  Masslike thickening of the anterior and left lateral bladder wall suspicious for metastatic implant.  Mesenteric, omental, peritoneal implants with a small ascites and diffuse mesenteric edema.  Small to moderate bilateral pleural effusion with associated compressive atelectasis of the lung.  Diffuse small osseous sclerotic metastatic disease. For work-up of her left swollen ankle, ultrasound venous lower extremity unilateral was performed and patient is positive for DVT in the popliteal, posterior tibial, and peroneal veins.   Former smoker.  Family history of paternal grandmother deceased from pancreatic cancer and paternal aunt had breast cancer [diagnosed at young age] she reports that genetic testing was done and she was tested negative. Husband at bedside.  Review of Systems  Constitutional: Positive for malaise/fatigue. Negative for weight loss.  HENT: Negative for sore throat.   Eyes: Negative for redness.  Respiratory: Negative for cough and shortness of breath.   Cardiovascular: Positive for leg swelling.  Gastrointestinal: Positive for abdominal pain. Negative for nausea and vomiting.  Genitourinary: Negative for dysuria.  Musculoskeletal: Positive for back pain.  Skin: Negative for rash.  Neurological: Positive for dizziness. Negative for headaches.  Endo/Heme/Allergies: Does not bruise/bleed easily.  Psychiatric/Behavioral: Negative for hallucinations.    Allergies  Allergen Reactions  . Adhesive [Tape] Other (See Comments)    Redness:Please use paper tape    Patient Active Problem List   Diagnosis Date Noted  . Hypercalcemia 12/16/2017  . Bilateral posterior neck pain 11/24/2017  . Elevated liver enzymes 11/24/2017  . Tubular adenoma of colon  10/11/2017  . 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 Generalized anxiety disorder 09/19/2017  . Vertigo, labyrinthine, bilateral 08/27/2017  . Prediabetes 08/27/2017  . Encounter for screening colonoscopy 09/21/2014  . Other malaise and fatigue 12/14/2013  . Hot flashes, menopausal 08/08/2012  . Neutropenia, drug-induced ([place]) 12/15/2011  . Primary cancer of upper outer quadrant of left female breast ([place]) 11/17/2011  . Hypertension 09/23/2011  . PLANTAR FASCIITIS, LEFT 12/01/2009  . UNEQUAL LEG LENGTH 12/01/2009     Past Medical History:  Diagnosis Date  . Anxiety    takes Xanax prn  . Breast cancer [facility]) August 2013   bilateral, er/pr+  . Complication of anesthesia    pt states she had the shakes extremely bad  . Cough    at night d/t reflux;nothing productive  . Dental crowns present    x 4  . GERD (gastroesophageal reflux disease)    takes Nexium daily  . History of bronchitis    08/2011  . History of colon polyps   . Hot flashes, menopausal 08/08/2012  . IBS (irritable bowel syndrome)    immodium prn  . Insomnia    takes Ambien nightly  . Irritable bowel syndrome   . Neutropenia, drug-induced ([place]) 12/15/2011  . Nocturia   . Plantar fasciitis   . S/P radiation therapy 07/03/2012 through 08/18/2038                                                        Right chest wall/regional lymph nodes 5040 cGy 28 sessions, right chest wall/mastectomy scar boost 1000 cGy 5 sessions                          . Status post chemotherapy    FEC 100 starting on 12/08/2011 - 01/18/12/single agent Taxol x12 weeks from 02/02/2012 through January 2014   . Urinary frequency   . Use of tamoxifen (Nolvadex)   . Vertigo      Past Surgical History:  Procedure Laterality Date  . BREAST LUMPECTOMY  2008   left  . COLONOSCOPY    . COLONOSCOPY WITH PROPOFOL N/A 10/10/2017   Procedure: COLONOSCOPY WITH PROPOFOL;  Surgeon: Lucilla Lame, MD;  Location[facility] ENDOSCOPY;  Service:  Endoscopy;  Laterality: N/A;  . MASTECTOMY    . MASTECTOMY WITH AXILLARY LYMPH NODE DISSECTION Right 05/18/2012   Procedure: MASTECTOMY WITH AXILLARY LYMPH NODE DISSECTION;  Surgeon: Adin Hector, MD;  Location: [place];  Service: General;  Laterality: Right;  Bilateral Total Mastectomy , right axillary lymph node dissection left axillary sentinel node biopsy, removal of Porta Cath  . PORT-A-CATH REMOVAL N/A 05/18/2012   Procedure: REMOVAL PORT-A-CATH;  Surgeon: Adin Hector, MD;  Location: [place];  Service: General;  Laterality: N/A;  . PORTACATH PLACEMENT  12/02/2011   Procedure: INSERTION PORT-A-CATH;  Surgeon: Adin Hector, MD;  Location: [place];  Service: General;  Laterality: N/A;  insertion port a cath with fluoroscopy  . SIMPLE MASTECTOMY WITH AXILLARY SENTINEL NODE BIOPSY Left 05/18/2012   Procedure: SIMPLE MASTECTOMY WITH AXILLARY SENTINEL NODE BIOPSY;  Surgeon: Adin Hector, MD;  Location: [place];  Service: General;  Laterality: Left;    Social History   Socioeconomic History  . Marital status: Married  Spouse name: Not on file  . Number of children: Not on file  . Years of education: Not on file  . Highest education level: Not on file  Occupational History  . Occupation: Network engineer II    Employer: [employer]  Social Needs  . Financial resource strain: Not on file  . Food insecurity:    Worry: Not on file    Inability: Not on file  . Transportation needs:    Medical: Not on file    Non-medical: Not on file  Tobacco Use  . Smoking status: Former Smoker    Packs/day: 0.50    Last attempt to quit: 03/21/1997    Years since quitting: 20.7  . Smokeless tobacco: Never Used  Substance and Sexual Activity  . Alcohol use: Yes    Alcohol/week: 0.0 standard drinks    Comment: occasionally wine  . Drug use: No  . Sexual activity: Yes    Birth control/protection: None  Lifestyle  . Physical activity:    Days per week: Not on file    Minutes per  session: Not on file  . Stress: Not on file  Relationships  . Social connections:    Talks on phone: Not on file    Gets together: Not on file    Attends religious service: Not on file    Active member of club or organization: Not on file    Attends meetings of clubs or organizations: Not on file    Relationship status: Not on file  . Intimate partner violence:    Fear of current or ex partner: Not on file    Emotionally abused: Not on file    Physically abused: Not on file    Forced sexual activity: Not on file  Other Topics Concern  . Not on file  Social History Narrative   Married to [employer]  No children 16 years old with fmp.  20 year use of birth control pills.       Family History  Problem Relation Age of Onset  . Hypertension Mother   . Hypertension Maternal Grandmother   . Pancreatic cancer Paternal Grandmother        diagnosed in her 61s  . Ovarian cancer Maternal Aunt        maternal grandmother's sister  . Breast cancer Paternal Aunt        diagnosed in late 7s early 46s  . Parkinsonism Paternal Grandfather      Current Facility-Administered Medications:  .  acetaminophen (TYLENOL) tablet 650 mg, 650 mg, Oral, Q6H PRN **OR** acetaminophen (TYLENOL) suppository 650 mg, 650 mg, Rectal, Q6H PRN, Jodell Cipro, Prasanna, MD .  amLODipine (NORVASC) tablet 5 mg, 5 mg, Oral, Daily, Jodell Cipro, Prasanna, MD, 5 mg at 12/16/17 1347 .  bisacodyl (DULCOLAX) EC tablet 5 mg, 5 mg, Oral, Daily PRN, Arta Silence, MD .  Derrill Memo ON 12/17/2017] ceFAZolin (ANCEF) IVPB 2g/100 mL premix, 2 g, Intravenous, Once, Hollice Espy, MD .  diazepam (VALIUM) tablet 10 mg, 10 mg, Oral, QHS PRN, Arta Silence, MD .  Derrill Memo ON 12/17/2017] exemestane (AROMASIN) tablet 25 mg, 25 mg, Oral, QPC breakfast, Jodell Cipro, Prasanna, MD .  [COMPLETED] heparin bolus via infusion 4,000 Units, 4,000 Units, Intravenous, Once, 4,000 Units at 12/16/17 1457 **FOLLOWED BY** heparin ADULT infusion 100  units/mL (25000 units/270m sodium chloride 0.45%), 1,100 Units/hr, Intravenous, Continuous, Hallaji, Sheema M, RPH, Last Rate: 11 mL/hr at 12/16/17 1503, 1,100 Units/hr at 12/16/17 1503 .  HYDROmorphone (DILAUDID) injection 0.5-1 mg, 0.5-1 mg, Intravenous,  Q3H PRN, Arta Silence, MD .  Derrill Memo ON 12/17/2017] Influenza vac split quadrivalent PF (FLUARIX) injection 0.5 mL, 0.5 mL, Intramuscular, Tomorrow-1000, Konidena, Snehalatha, MD .  ondansetron (ZOFRAN) tablet 4 mg, 4 mg, Oral, Q6H PRN **OR** ondansetron (ZOFRAN) injection 4 mg, 4 mg, Intravenous, Q6H PRN, Jodell Cipro, Prasanna, MD .  pantoprazole (PROTONIX) EC tablet 40 mg, 40 mg, Oral, Daily, Jodell Cipro, Prasanna, MD, 40 mg at 12/16/17 1347 .  potassium chloride 10 mEq in 100 mL IVPB, 10 mEq, Intravenous, Q1 Hr x 4, Sridharan, Prasanna, MD, Last Rate: 100 mL/hr at 12/16/17 1642, 10 mEq at 12/16/17 1642 .  scopolamine (TRANSDERM-SCOP) 1 MG/3DAYS 1.5 mg, 1 patch, Transdermal, Q72H, Sridharan, Prasanna, MD, 1.5 mg at 12/16/17 1555 .  senna-docusate (Senokot-S) tablet 1 tablet, 1 tablet, Oral, QHS PRN, Jodell Cipro, Prasanna, MD .  traZODone (DESYREL) tablet 25-50 mg, 25-50 mg, Oral, QHS PRN, Arta Silence, MD   Physical exam:  Vitals:   12/16/17 0800 12/16/17 0930 12/16/17 1225 12/16/17 1628  BP: 132/80 130/78 138/79 130/82  Pulse: 84 81 90 87  Resp: _0 Temp:   98.1 F (36.7 C) 98 F (36.7 C)  TempSrc:   Oral Oral  SpO2: 91% 90% 90% 91%  Weight:   159 lb 12.8 oz (72.5 kg)   Height:   _1  (1.626 m)    Physical Exam  Constitutional: She is oriented to person, place, and time. No distress.  HENT:  Head: Normocephalic and atraumatic.  Eyes: Pupils are equal, round, and reactive to light. EOM are normal.  Neck: Normal range of motion. Neck supple.  Cardiovascular: Normal rate and regular rhythm.  Pulmonary/Chest: Effort normal and breath sounds normal.  Abdominal: Soft. Bowel sounds are normal. There is no tenderness.   Musculoskeletal: Normal range of motion. She exhibits edema.  Lymphadenopathy:    She has no cervical adenopathy.  Neurological: She is alert and oriented to person, place, and time. Coordination normal.  Skin: Skin is warm and dry.  Psychiatric: Affect normal.  Breast exam is performed in seated and lying down position. Patient is status post bilateral mastectomy. No clinical signs of any chest wall recurrence. No palpable axillary lymph nodes.    CMP Latest Ref Rng & Units 12/16/2017  Glucose 70 - 99 mg/dL -  BUN 6 - 20 mg/dL -  Creatinine 0.44 - 1.00 mg/dL -  Sodium 135 - 145 mmol/L -  Potassium 3.5 - 5.1 mmol/L -  Chloride 98 - 111 mmol/L -  CO2 22 - 32 mmol/L -  Calcium 8.9 - 10.3 mg/dL 12.2(H)  Total Protein 6.5 - 8.1 g/dL -  Total Bilirubin 0.3 - 1.2 mg/dL -  Alkaline Phos 38 - 126 U/L -  AST 15 - 41 U/L -  ALT 0 - 44 U/L -   CBC Latest Ref Rng & Units 12/16/2017  WBC 3.6 - 11.0 K/uL 7.6  Hemoglobin 12.0 - 16.0 g/dL 13.0  Hematocrit 35.0 - 47.0 % 37.5  Platelets 150 - 440 K/uL 201   RADIOGRAPHIC STUDIES: I have personally reviewed the radiological images as listed and agreed with the findings in the report. Dg Cervical Spine Complete  Result Date: 11/22/2017 CLINICAL DATA:  Neck stiffness for 2 weeks.  Occipital headaches EXAM: CERVICAL SPINE - COMPLETE 4+ VIEW COMPARISON:  None similar FINDINGS: Focal C5-6 disc narrowing with mild endplate ridging and retrolisthesis. No evidence of fracture or bone lesion. No noted facet spurring or erosion. No prevertebral thickening. IMPRESSION: 1. Focal C5-6  disc degeneration. 2. No acute finding. Electronically Signed   By: Monte Fantasia M.D.   On: 11/22/2017 16:02   US Venous Img Lower Unilateral Left  Result Date: 12/16/2017 CLINICAL DATA:  Lower extremity edema EXAM: LEFT LOWER EXTREMITY VENOUS DUPLEX ULTRASOUND TECHNIQUE: Doppler venous assessment of the left lower extremity deep venous system was performed, including  characterization of spectral flow, compressibility, and phasicity. COMPARISON:  None. FINDINGS: There is complete compressibility of the left common femoral and femoral veins. The left popliteal vein is partially occlusive and contains partially occlusive DVT. Thrombus is mixed echogenicity with areas of hypoechoic and hyperechoic signal. There is occlusive thrombus in the posterior tibial and peroneal veins. Doppler analysis demonstrates respiratory phasicity and augmentation. IMPRESSION: The study is positive for DVT in the popliteal, posterior tibial, and peroneal veins as described. Critical Value/emergent results were called by telephone at the time of interpretation on 12/16/2017 at 9:24 am to Dr. Alfred Levins, who verbally acknowledged these results. Electronically Signed   By: Marybelle Killings M.D.   On: 12/16/2017 09:25   Ct Renal Stone Study  Result Date: 12/16/2017 CLINICAL DATA:  58 year old female with abdominal pain. History of breast cancer and bilateral mastectomy. Patient is on chemo and radiation treatment. EXAM: CT ABDOMEN AND PELVIS WITHOUT CONTRAST TECHNIQUE: Multidetector CT imaging of the abdomen and pelvis was performed following the standard protocol without IV contrast. COMPARISON:  PET-CT dated 12/01/2011 FINDINGS: Evaluation of this exam is limited in the absence of intravenous contrast. Lower chest: Partially visualized small to moderate bilateral pleural effusions with associated partial compressive atelectasis of the adjacent lungs and areas of linear atelectasis/scarring. No intra-abdominal free air. Diffuse mesenteric edema and stranding and small ascites. Hepatobiliary: Subcentimeter hypodense focus in the right lobe of the liver is not characterized. The liver is otherwise unremarkable on this noncontrast CT. Probable tiny stone or sludge within the gallbladder. Pancreas: Unremarkable. No pancreatic ductal dilatation or surrounding inflammatory changes. Spleen: Normal in size without  focal abnormality. Adrenals/Urinary Tract: Mild bilateral adrenal thickening. There is moderate bilateral hydronephrosis. The ureters are nondistended. The hydronephrosis is likely related to a degree of stricture at the ureteropelvic junctions secondary to retroperitoneal adhesions/fibrosis. No stone identified within the kidneys or along the course of the ureters. There is masslike thickening of the anterior and left lateral bladder wall likely metastatic disease. Stomach/Bowel: There is diffuse thickening of the stomach which may be partly related to underdistention and ascites. Gastritis or an infiltrative process is not entirely excluded. There is no bowel obstruction. The appendix is normal as visualized. Vascular/Lymphatic: Mild aortoiliac atherosclerotic disease. The abdominal aorta and IVC are grossly unremarkable on this noncontrast CT. No portal venous gas. There are multiple somewhat small retroperitoneal and para-aortic lymph nodes. A mildly enlarged right iliac chain node measures 11 mm in short axis. There is diffuse mesenteric and omental nodularity and edema consistent with metastatic implants. Multiple top-normal and mildly rounded mesenteric lymph nodes measure up to 8 mm in short axis. Reproductive: Evaluation of the pelvic structures is very limited. The uterus appears retroflexed. A 3.3 x 3.9 x 2.6 cm ill-defined high attenuating solid lesion to the right of the uterine fundus is not well evaluated and may represent a fibroid or a complex lesion arising from the right adnexa. However, a neoplastic mass is not excluded. Pelvic ultrasound may provide better evaluation. Other: There is mild diffuse subcutaneous edema of the abdominal wall. Musculoskeletal: Multiple small scattered sclerotic lesions throughout the spine most consistent with metastatic disease.  No acute fracture. IMPRESSION: 1. Moderate bilateral hydronephrosis, likely related to strictures at the level of the UPJ's, and likely  secondary to retroperitoneal adhesions or fibrosis. No obstructing stone. 2. Masslike thickening of the anterior and left lateral bladder wall suspicious for metastatic implant. 3. Mesenteric, omental, and peritoneal implants with a small ascites and diffuse mesenteric edema. 4. Small to moderate bilateral pleural effusions with associated compressive atelectasis of the lungs. 5. Diffuse small osseous sclerotic metastatic disease. Electronically Signed   By: Anner Crete M.D.   On: 12/16/2017 06:53    Assessment and plan- Patient is a 57 y.o. female with history of Stage III right breast cancer, ER/PR positive and HER2 negative, s/p chemotherapy, B/L mastectomy, adjuvant RT and on extended Aromasin and Zolodex presents with abdominal pain  # Hypercalcemia, most likely due to metastatic cancer, she already received calcitonin 4 units/kg x 1, about to start Zometa 4 mg x 1.  She had been started on aggressive IV hydration 2100m/h, switched to 1558mcontinuously.  Monitor calcium level closely.  Repeat calcium in the afternoon trended down. Repeat calcium in AM, further calcitonin pending AM calcium level.   #Acute renal failure secondary to post renal obstruction at the level of UPJ, possible bladder metastatic implant. She was evaluated by urology Dr. BrErlene Quannd plan ureteral stent placement tomorrow morning.  Tissue diagnosis from bladder will be obtained if Dr. BrErlene Quanees suspicious lesion.  #Left lower extremity below-knee acute DVT, in the context of widely metastatic malignancy, recommend starting heparin drip which is easy to be hold and restart given upcoming invasive procedures.  #Abdominal CT scan, CT was independently reviewed by me and discussed with patient and his husband.  Suspect metastatic cancer.  Possible metastatic breast cancer versus other malignancy.  Need to establish tissue diagnosis.  If no malignancy was discovered with urology procedure, plan CT-guided biopsy of omental  nodule.  #Onoging Dizziness/lentigo since April 2019.  Patient has had a negative MRI of brain in April 2019.  I recommend repeating another brain MRI for further evaluation of any possible intracranial metastasis, if still negative,recommend LP. This maybe done outpatient if she remains neurologically stable.    Thank you for allowing me to participate in the care of this 70 min. >50% of the time was  spent in counseling and coordination of care.    ZhEarlie ServerMD, PhD Hematology Oncology [facility]- [phone]/28/2019

## 2017-12-16 NOTE — ED Triage Notes (Addendum)
Pt states she received a call from her primary MD office this am at 0230 to notify of high calcium. Pt states level was 13.1 and was told to come to ed. Pt appears in no acute distress. Pt also complains of intermittent right sided lower abd pain wrapping to back.

## 2017-12-16 NOTE — ED Notes (Signed)
Family informed of bed assignment and status.

## 2017-12-16 NOTE — Consult Note (Signed)
ANTICOAGULATION CONSULT NOTE - Initial Consult  Pharmacy Consult for Heparin Drip  Indication: DVT  Allergies  Allergen Reactions  . Adhesive [Tape] Other (See Comments)    Redness:Please use paper tape    Patient Measurements: Height: 5\' 4"  (162.6 cm) Weight: 160 lb (72.6 kg) IBW/kg (Calculated) : 54.7 Heparin Dosing Weight: 62kg  Vital Signs: Temp: 98.1 F (36.7 C) (09/28 1225) Temp Source: Oral (09/28 1225) BP: 138/79 (09/28 1225) Pulse Rate: 90 (09/28 1225)  Labs: Recent Labs    12/15/17 1525 12/16/17 0332  HGB  --  13.0  HCT  --  37.5  PLT  --  201  CREATININE 1.58* 1.63*    Estimated Creatinine Clearance: 37.7 mL/min (A) (by C-G formula based on SCr of 1.63 mg/dL (H)).   Medical History: Past Medical History:  Diagnosis Date  . Anxiety    takes Xanax prn  . Breast cancer [facility]) August 2013   bilateral, er/pr+  . Complication of anesthesia    pt states she had the shakes extremely bad  . Cough    at night d/t reflux;nothing productive  . Dental crowns present    x 4  . GERD (gastroesophageal reflux disease)    takes Nexium daily  . History of bronchitis    08/2011  . History of colon polyps   . Hot flashes, menopausal 08/08/2012  . IBS (irritable bowel syndrome)    immodium prn  . Insomnia    takes Ambien nightly  . Irritable bowel syndrome   . Neutropenia, drug-induced ([place]) 12/15/2011  . Nocturia   . Plantar fasciitis   . S/P radiation therapy 07/03/2012 through 08/18/2038                                                        Right chest wall/regional lymph nodes 5040 cGy 28 sessions, right chest wall/mastectomy scar boost 1000 cGy 5 sessions                          . Status post chemotherapy    FEC 100 starting on 12/08/2011 - 01/18/12/single agent Taxol x12 weeks from 02/02/2012 through January 2014   . Urinary frequency   . Use of tamoxifen (Nolvadex)   . Vertigo    Assessment: Pharmacy consulted for heparin drip dosing and monitoring  in 57 yo female admitted with Hypercalcemia and found to have DVT.   Goal of Therapy:  Heparin level 0.3-0.7 units/ml Monitor platelets by anticoagulation protocol: Yes   Plan:  Baseline labs ordered Dosing weight: 62kg  Give 4000 units bolus x 1 Start heparin infusion at 1100 units/hr Check anti-Xa level in 6 hours and daily while on heparin Continue to monitor H&H and platelets  Pernell Dupre, PharmD, BCPS Clinical Pharmacist 12/16/2017 1:55 PM

## 2017-12-16 NOTE — H&P (Signed)
[facility] NAME: Mary [name]    MR#:  [id number]  DATE OF BIRTH:  [birth year]  DATE OF ADMISSION:  12/16/2017  PRIMARY CARE PHYSICIAN: [name], MD   REQUESTING/REFERRING PHYSICIAN: Harvest Dark, MD  CHIEF COMPLAINT:   Chief Complaint  Patient presents with  . abnormal labs    HISTORY OF PRESENT ILLNESS:  Mary [name]  is a 57 y.o. female with a known history of T2N2M0 stage IIIa ER/PR (+) HER-2 (-) BrCa (s/p B/L mastectomy + ALND 2014, NAC FEC x4 + Taxol x12 + XRT + Tamoxifen + Aromasin) p/w hypercalcemia, renal failure, R abdominal/flank/back pain. Pt states she had outpt labwork performed yesterday (Friday 09/27). She was called @~0215AM on Saturday 09/28 and told to go to the ED due to abnormal lab values. Calcium level is 13.9. Cr is 1.63.  Pt endorses vertigo since 06/2017. She has constant vertigo, even at rest, but worsened w/ position and head movement. Description is more consistent w/ peripheral vertigo. She mentions labyrinthitis as the presumptive diagnosis. She states she had MRI/MRA of the brain, multiple canalith repositioning maneuvers by ENT (the last of which she states may have actually worsened her symptoms), and she is presently going to [facility]. She states Meclizine does not alleviate her symptoms. She states the vertigo has been stable in character and severity since April, up until ~2 days ago, when she states she feels it became worse, resulting in falls. She says she has not fallen prior to the past 2d.  Pt endorses a 88moHx RLQ AP radiating to R flank + R low back pain, variable character (sharp/dull). She endorses constipation. She denies N/V/D, melena or hematochezia. She endorses increased urinary frequency [id number] she attributes this to being started on diuretics around that time. She denies fever, chills, diaphoresis, night sweats, rigors, unintentional weight loss. Her  left ankle is swollen, but she states she twisted/sprained it getting out of the car on Monday 09/23.  Pt is a former smoker, 1/2ppd x~10-1523yr CT A/P performed in ED demonstrates the following: -Moderate bilateral hydronephrosis, likely related to strictures at the level of the UPJ's, and likely secondary to retroperitoneal adhesions or fibrosis. No obstructing stone. -Masslike thickening of the anterior and left lateral bladder wall suspicious for metastatic implant. -Mesenteric, omental, and peritoneal implants with a small ascites and diffuse mesenteric edema. -Small to moderate bilateral pleural effusions with associated compressive atelectasis of the lungs. -Diffuse small osseous sclerotic metastatic disease.  Hypercalcemia likely 2/2 suspected malignancy based on imaging findings. 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 Pt and husband informed of findings.  PAST MEDICAL HISTORY:   Past Medical History:  Diagnosis Date  . Anxiety    takes Xanax prn  . [facility]   [facility], [facility]/pr+  . Complication of anesthesia    pt states she had the shakes extremely bad  . Cough    at night d/t reflux;nothing productive  . Dental crowns present    x 4  . GERD (gastroesophageal reflux disease)    takes Nexium daily  . History of bronchitis    08/2011  . History of colon polyps   . Hot flashes, menopausal 08/08/2012  . IBS (irritable bowel syndrome)    immodium prn  . Insomnia    takes Ambien nightly  . Irritable bowel syndrome   . Neut[place]enia[place] ([facility]  12/15/2011  . Nocturia   . Plantar fasciitis   . S/P radiation therapy 07/03/2012 through 08/18/2038                                                        Right chest wall/regional lymph nodes 5040 cGy 28 sessions, right chest wall/mastectomy scar boost 1000 cGy 5 sessions                          . Status post chemotherapy    FEC 100  starting on 12/08/2011 - 01/18/12/single agent Taxol x12 weeks from 02/02/2012 through January 2014   . Urinary frequency   . Use of tamoxifen (Nolvadex)   . Vertigo     PAST SURGICAL HISTORY:   Past Surgical History:  Procedure Laterality Date  . BREAST LUMPECTOMY  2008   left  . COLONOSCOPY    . COLONOSCOPY WITH PROPOFOL N/A 10/10/2017   Procedure: COLONOSCOPY WITH PROPOFOL;  Surgeon: Lucilla Lame, MD;  Location: [facility] ENDOSCOPY;  Service: Endoscopy;  Laterality: N/A;  . MASTECTOMY    . MASTECTOMY WITH AXILLARY LYMPH NODE DISSECTION Right 05/18/2012   Procedure: MASTECTOMY WITH AXILLARY LYMPH NODE DISSECTION;  Surgeon: Adin Hector, MD;  Location: [facility];  Service: General;  Laterality: Right;  Bilateral Total Mastectomy , right axillary lymph node dissection left axillary sentinel node biopsy, removal of Porta Cath  . PORT-A-CATH REMOVAL N/A 05/18/2012   Procedure: REMOVAL PORT-A-CATH;  Surgeon: Adin Hector, MD;  Location: [place];  Service: General;  Laterality: N/A;  . PORTACATH PLACEMENT  12/02/2011   Procedure: INSERTION PORT-A-CATH;  Surgeon: Adin Hector, MD;  Location: [place];  Service: General;  Laterality: N/A;  insertion port a cath with fluoroscopy  . SIMPLE MASTECTOMY WITH AXILLARY SENTINEL NODE BIOPSY Left 05/18/2012   Procedure: SIMPLE MASTECTOMY WITH AXILLARY SENTINEL NODE BIOPSY;  Surgeon: Adin Hector, MD;  Location: [place];  Service: General;  Laterality: Left;    SOCIAL HISTORY:   Social History   Tobacco Use  . Smoking status: Former Smoker    Packs/day: 0.50    Last attempt to quit: 03/21/1997    Years since quitting: 20.7  . Smokeless tobacco: Never Used  Substance Use Topics  . Alcohol use: Yes    Alcohol/week: 0.0 standard drinks    Comment: occasionally wine    FAMILY HISTORY:   Family History  Problem Relation Age of Onset  . Hypertension Mother   . Hypertension Maternal Grandmother   . Pancreatic cancer Paternal  Grandmother        diagnosed in her 106s  . Ovarian cancer Maternal Aunt        maternal grandmother's sister  . Breast cancer Paternal Aunt        diagnosed in late 15s early 38s  . Parkinsonism Paternal Grandfather     DRUG ALLERGIES:   Allergies  Allergen Reactions  . Adhesive [Tape] Other (See Comments)    Redness:Please use paper tape    REVIEW OF SYSTEMS:   Review of Systems  Constitutional: Positive for malaise/fatigue. Negative for chills, diaphoresis, fever and weight loss.  HENT: Negative for congestion, ear pain, hearing loss, nosebleeds, sinus pain, sore throat and tinnitus.   Eyes: Negative for blurred vision, double vision, photophobia,  pain, discharge and redness.  Respiratory: Negative for cough, hemoptysis, sputum production, shortness of breath and wheezing.   Cardiovascular: Positive for leg swelling. Negative for chest pain, palpitations, orthopnea, claudication and PND.  Gastrointestinal: Positive for abdominal pain and constipation. Negative for blood in stool, diarrhea, heartburn, melena, nausea and vomiting.  Genitourinary: Positive for flank pain and frequency. Negative for dysuria, hematuria and urgency.  Musculoskeletal: Positive for back pain and falls. Negative for joint pain, myalgias and neck pain.  Skin: Negative for itching and rash.  Neurological: Positive for dizziness (+) vertigo and weakness. Negative for tingling, tremors, sensory change, speech change, focal weakness, seizures, loss of consciousness and headaches.  Psychiatric/Behavioral: Negative for memory loss. The patient does not have insomnia.    MEDICATIONS AT HOME:   Prior to Admission medications   Medication Sig Start Date End Date Taking? Authorizing Provider  amLODipine (NORVASC) 5 MG tablet TAKE 1 TABLET BY MOUTH DAILY. 09/27/17  Yes [name], MD  Ascorbic Acid (VITAMIN C) 100 MG tablet Take 100 mg by mouth daily.     Yes [provider]  Calcium Carbonate-Vitamin  D (CALTRATE 600+D PO) Take 1 tablet by mouth daily.   Yes [provider]  diazepam (VALIUM) 10 MG tablet Take 1 tablet (10 mg total) by mouth at bedtime as needed for anxiety. 09/18/17  Yes [name], MD  esomeprazole (NEXIUM) 40 MG capsule Take 40 mg by mouth daily before breakfast.   Yes [provider]  exemestane (AROMASIN) 25 MG tablet TAKE 1 TABLET BY MOUTH ONCE DAILY AFTER BREAKFAST 08/10/17  Yes Nicholas Lose, MD  goserelin (ZOLADEX) 3.6 MG injection Inject 3.6 mg into the skin every 28 (twenty-eight) days.   Yes [provider]  loperamide (IMODIUM A-D) 2 MG tablet Take 2 mg by mouth 4 (four) times daily as needed for diarrhea or loose stools.    Yes [provider]  losartan-hydrochlorothiazide (HYZAAR) 50-12.5 MG tablet Take 1 tablet by mouth daily. 08/25/17  Yes [name], MD  methocarbamol (ROBAXIN) 500 MG tablet Take 1 tablet (500 mg total) by mouth 4 (four) times daily. 11/22/17  Yes [name], MD  Multiple Vitamin (MULTIVITAMIN) tablet Take 1 tablet by mouth daily.     Yes [provider]  ondansetron (ZOFRAN) 4 MG tablet Take 1 tablet (4 mg total) by mouth every 8 (eight) hours as needed for nausea or vomiting. 11/22/17  Yes [name], MD  traZODone (DESYREL) 50 MG tablet Take 0.5-1 tablets (25-50 mg total) by mouth at bedtime as needed for sleep. 09/18/17  Yes [name], MD  predniSONE (DELTASONE) 10 MG tablet 6 tablets daily for 3 days, then reduce by 1 tablet daily until gone Patient not taking: Reported on 12/16/2017 11/22/17   [name], MD      VITAL SIGNS:  Blood pressure (!) 161/85, pulse 100, temperature 98.1 F (36.7 C), temperature source Oral, resp. rate 16, height _0  (1.626 m), weight 72.6 kg, last menstrual period 11/28/2011, SpO2 96 %.  PHYSICAL EXAMINATION:  Physical Exam  Constitutional: She is oriented to person, place, and time. She appears well-developed and well-nourished. She is active  and cooperative.  Non-toxic appearance. No distress. She is not intubated.  HENT:  Head: Normocephalic and atraumatic.  Mouth/Throat: Oropharynx is clear and moist. No oropharyngeal exudate.  Eyes: EOM and lids are normal. No scleral icterus.  Neck: Neck supple. No JVD present. No thyromegaly present.  Cardiovascular: Normal  rate, regular rhythm, S1 normal, S2 normal and normal heart sounds.  No extrasystoles are present. Exam reveals no gallop, no S3, no S4, no distant heart sounds and no friction rub.  No murmur heard. Pulmonary/Chest: Effort normal and breath sounds normal. No accessory muscle usage or stridor. No apnea, no tachypnea and no bradypnea. She is not intubated. No respiratory distress. She has no decreased breath sounds. She has no wheezes. She has no rhonchi. She has no rales.  Abdominal: Soft. She exhibits no distension. Bowel sounds are decreased. There is tenderness in the right lower quadrant. There is no rigidity, no rebound and no guarding.  Musculoskeletal: She exhibits edema and tenderness.  Neurological: She is alert and oriented to person, place, and time. She is not disoriented.  Skin: Skin is warm, dry and intact. No rash noted. She is not diaphoretic. No erythema.  Psychiatric: She has a normal mood and affect. Her speech is normal and behavior is normal. Judgment and thought content normal. Cognition and memory are normal.   LABORATORY PANEL:   CBC Recent Labs  Lab 12/16/17 0332  WBC 7.6  HGB 13.0  HCT 37.5  PLT 201   ------------------------------------------------------------------------------------------------------------------  Chemistries  Recent Labs  Lab 12/16/17 0332  NA 139  K 3.0*  CL 102  CO2 28  GLUCOSE 113*  BUN 30*  CREATININE 1.63*  CALCIUM 13.9*  MG 2.1  AST 39  ALT 43  ALKPHOS 110  BILITOT 0.6   ------------------------------------------------------------------------------------------------------------------  Cardiac  Enzymes No results for input(s): TROPONINI in the last 168 hours. ------------------------------------------------------------------------------------------------------------------  RADIOLOGY:  Ct Renal Stone Study  Result Date: 12/16/2017 CLINICAL DATA:  57 year old female with abdominal pain. History of breast cancer and bilateral mastectomy. Patient is on chemo and radiation treatment. EXAM: CT ABDOMEN AND PELVIS WITHOUT CONTRAST TECHNIQUE: Multidetector CT imaging of the abdomen and pelvis was performed following the standard protocol without IV contrast. COMPARISON:  PET-CT dated 12/01/2011 FINDINGS: Evaluation of this exam is limited in the absence of intravenous contrast. Lower chest: Partially visualized small to moderate bilateral pleural effusions with associated partial compressive atelectasis of the adjacent lungs and areas of linear atelectasis/scarring. No intra-abdominal free air. Diffuse mesenteric edema and stranding and small ascites. Hepatobiliary: Subcentimeter hypodense focus in the right lobe of the liver is not characterized. The liver is otherwise unremarkable on this noncontrast CT. Probable tiny stone or sludge within the gallbladder. Pancreas: Unremarkable. No pancreatic ductal dilatation or surrounding inflammatory changes. Spleen: Normal in size without focal abnormality. Adrenals/Urinary Tract: Mild bilateral adrenal thickening. There is moderate bilateral hydronephrosis. The ureters are nondistended. The hydronephrosis is likely related to a degree of stricture at the ureteropelvic junctions secondary to retroperitoneal adhesions/fibrosis. No stone identified within the kidneys or along the course of the ureters. There is masslike thickening of the anterior and left lateral bladder wall likely metastatic disease. Stomach/Bowel: There is diffuse thickening of the stomach which may be partly related to underdistention and ascites. Gastritis or an infiltrative process is not  entirely excluded. There is no bowel obstruction. The appendix is normal as visualized. Vascular/Lymphatic: Mild aortoiliac atherosclerotic disease. The abdominal aorta and IVC are grossly unremarkable on this noncontrast CT. No portal venous gas. There are multiple somewhat small retroperitoneal and para-aortic lymph nodes. A mildly enlarged right iliac chain node measures 11 mm in short axis. There is diffuse mesenteric and omental nodularity and edema consistent with metastatic implants. Multiple top-normal and mildly rounded mesenteric lymph nodes measure up to 8 mm in  short axis. Reproductive: Evaluation of the pelvic structures is very limited. The uterus appears retroflexed. A 3.3 x 3.9 x 2.6 cm ill-defined high attenuating solid lesion to the right of the uterine fundus is not well evaluated and may represent a fibroid or a complex lesion arising from the right adnexa. However, a neoplastic mass is not excluded. Pelvic ultrasound may provide better evaluation. Other: There is mild diffuse subcutaneous edema of the abdominal wall. Musculoskeletal: Multiple small scattered sclerotic lesions throughout the spine most consistent with metastatic disease. No acute fracture. IMPRESSION: 1. Moderate bilateral hydronephrosis, likely related to strictures at the level of the UPJ's, and likely secondary to retroperitoneal adhesions or fibrosis. No obstructing stone. 2. Masslike thickening of the anterior and left lateral bladder wall suspicious for metastatic implant. 3. Mesenteric, omental, and peritoneal implants with a small ascites and diffuse mesenteric edema. 4. Small to moderate bilateral pleural effusions with associated compressive atelectasis of the lungs. 5. Diffuse small osseous sclerotic metastatic disease. Electronically Signed   By: Anner Crete M.D.   On: 12/16/2017 06:53   IMPRESSION AND PLAN:   A/P: 84F w/ PMHx T2N2M0 stage IIIa ER/PR (+) HER-2 (-) BrCa (s/p B/L mastectomy + ALND 2014, NAC FEC  x4 + Taxol x12 + XRT + Tamoxifen + Aromasin) p/w hypercalcemia, renal failure, R abdominal/flank/back pain. CT A/P findings suggestive of peritoneal carcinomatosis w/ B/L ureteral obstruction. Vertigo (chronic). Hypokalemia, hyperglycemia. -Hypercalcemia: Suspect hypercalcemia of malignancy (possibly PTHRP-secreting). Received IVF, calcitonin, zoledronic acid in ED. Monitor BMP, ionized calcium. Hold HCTZ, calcium supplements. -Renal failure: CT A/P report states, "Moderate bilateral hydronephrosis, likely related to strictures at the level of the UPJ's, and likely secondary to retroperitoneal adhesions or fibrosis. No obstructing stone." Suspect process to be 2/2 peritoneal carcinomatosis. May need stents. Urology consult. IVF. Hold HCTZ, ARB. -R abdominal/flank/back pain: Suspect 2/2 peritoneal carcinomatosis, as well as the pathology mentioned above. Pain ctrl. -Suspected peritoneal carcinomatosis: CT A/P findings consistent w/ malignancy. Unclear on best methodology by which to obtain tissue for diagnosis. I wonder if it might be possible to obtain a Bx of metastatic lesion in bladder wall, in the event that pt is to be instrumented for a Urological procedure anyhow. Oncology consult. -Vertigo: Chronic. Has had workup and is undergoing mgmt as described in HPI. Failed Meclizine. Trial Scopolamine patch. -Hypokalemia: Replete and monitor. Mag 2.1. -c/w other home meds. -FEN/GI: Renal diabetic diet as tolerated. -DVT PPx: Lovenox. -Code status: Full code. -Disposition: Admission, > 2 midnights.   All the records are reviewed and case discussed with ED provider. Management plans discussed with the patient, family and they are in agreement.  CODE STATUS: Full code.  TOTAL TIME TAKING CARE OF THIS PATIENT: 75 minutes.    Arta Silence M.D on 12/16/2017 at 8:03 AM  Between 7am to 6pm - Pager - [phone]  After 6pm go to [email or web address] - Proofreader  [facility]  Office  [phone]  CC: Primary care physician; [name], MD   Note: This dictation was prepared with Dragon dictation along with smaller phrase technology. Any transcriptional errors that result from this process are unintentional.

## 2017-12-16 NOTE — ED Notes (Signed)
Report called and given to receiving RN elana. Floor states room currently being cleaned, wait 30 mins prior to transport and call to ensure room is ready.

## 2017-12-16 NOTE — Progress Notes (Signed)
ANTICOAGULATION CONSULT NOTE - Initial Consult  Pharmacy Consult for Heparin  Indication: chest pain/ACS  Allergies  Allergen Reactions  . Adhesive [Tape] Other (See Comments)    Redness:Please use paper tape    Patient Measurements: Height: 5\' 4"  (162.6 cm) Weight: 159 lb 12.8 oz (72.5 kg) IBW/kg (Calculated) : 54.7 Heparin Dosing Weight:   Vital Signs: Temp: 98 F (36.7 C) (09/28 1628) Temp Source: Oral (09/28 1628) BP: 130/82 (09/28 1628) Pulse Rate: 87 (09/28 1628)  Labs: Recent Labs    12/15/17 1525 12/16/17 0332 12/16/17 1343 12/16/17 2047  HGB  --  13.0  --   --   HCT  --  37.5  --   --   PLT  --  201  --   --   APTT  --   --  27  --   LABPROT  --   --  13.2  --   INR  --   --  1.01  --   HEPARINUNFRC  --   --   --  0.46  CREATININE 1.58* 1.63*  --   --     Estimated Creatinine Clearance: 37.6 mL/min (A) (by C-G formula based on SCr of 1.63 mg/dL (H)).   Medical History: Past Medical History:  Diagnosis Date  . Anxiety    takes Xanax prn  . Breast cancer [facility]) August 2013   bilateral, er/pr+  . Complication of anesthesia    pt states she had the shakes extremely bad  . Cough    at night d/t reflux;nothing productive  . Dental crowns present    x 4  . GERD (gastroesophageal reflux disease)    takes Nexium daily  . History of bronchitis    08/2011  . History of colon polyps   . Hot flashes, menopausal 08/08/2012  . IBS (irritable bowel syndrome)    immodium prn  . Insomnia    takes Ambien nightly  . Irritable bowel syndrome   . Neutropenia, drug-induced ([place]) 12/15/2011  . Nocturia   . Plantar fasciitis   . S/P radiation therapy 07/03/2012 through 08/18/2038                                                        Right chest wall/regional lymph nodes 5040 cGy 28 sessions, right chest wall/mastectomy scar boost 1000 cGy 5 sessions                          . Status post chemotherapy    FEC 100 starting on 12/08/2011 - 01/18/12/single agent  Taxol x12 weeks from 02/02/2012 through January 2014   . Urinary frequency   . Use of tamoxifen (Nolvadex)   . Vertigo     Medications:  Medications Prior to Admission  Medication Sig Dispense Refill Last Dose  . amLODipine (NORVASC) 5 MG tablet TAKE 1 TABLET BY MOUTH DAILY. 90 tablet 1 Past Week at Unknown time  . Ascorbic Acid (VITAMIN C) 100 MG tablet Take 100 mg by mouth daily.     Past Week at Unknown time  . Calcium Carbonate-Vitamin D (CALTRATE 600+D PO) Take 1 tablet by mouth daily.   Past Week at Unknown time  . diazepam (VALIUM) 10 MG tablet Take 1 tablet (10 mg total) by mouth at bedtime as  needed for anxiety. 30 tablet 5 prn at prn  . esomeprazole (NEXIUM) 40 MG capsule Take 40 mg by mouth daily before breakfast.   Past Week at Unknown time  . exemestane (AROMASIN) 25 MG tablet TAKE 1 TABLET BY MOUTH ONCE DAILY AFTER BREAKFAST 90 tablet 3 Past Week at Unknown time  . goserelin (ZOLADEX) 3.6 MG injection Inject 3.6 mg into the skin every 28 (twenty-eight) days.   Past Week at Unknown time  . loperamide (IMODIUM A-D) 2 MG tablet Take 2 mg by mouth 4 (four) times daily as needed for diarrhea or loose stools.    Past Week at Unknown time  . losartan-hydrochlorothiazide (HYZAAR) 50-12.5 MG tablet Take 1 tablet by mouth daily. 90 tablet 0 Past Week at Unknown time  . methocarbamol (ROBAXIN) 500 MG tablet Take 1 tablet (500 mg total) by mouth 4 (four) times daily. 90 tablet 2 Past Week at Unknown time  . Multiple Vitamin (MULTIVITAMIN) tablet Take 1 tablet by mouth daily.     Past Week at Unknown time  . ondansetron (ZOFRAN) 4 MG tablet Take 1 tablet (4 mg total) by mouth every 8 (eight) hours as needed for nausea or vomiting. 20 tablet 0 Past Month at Unknown time  . traZODone (DESYREL) 50 MG tablet Take 0.5-1 tablets (25-50 mg total) by mouth at bedtime as needed for sleep. 90 tablet 1 Past Week at Unknown time  . predniSONE (DELTASONE) 10 MG tablet 6 tablets daily for 3 days, then  reduce by 1 tablet daily until gone (Patient not taking: Reported on 12/16/2017) 21 tablet 0 Completed Course at Unknown time   Assessment: Pharmacy consulted for heparin drip dosing and monitoring in 57 yo female admitted with Hypercalcemia and found to have DVT.   Goal of Therapy:  Heparin level 0.3-0.7 units/ml Monitor platelets by anticoagulation protocol: Yes   Plan:  Baseline labs ordered Dosing weight: 62kg  Give 4000 units bolus x 1 Start heparin infusion at 1100 units/hr Check anti-Xa level in 6 hours and daily while on heparin Continue to monitor H&H and platelets  9/28:  HL @ 21:00 = 0.46 Will continue this pt on current rate and recheck HL on 9/29 @ 0300.   Rashaunda Rahl D 12/16/2017,9:26 PM

## 2017-12-16 NOTE — ED Notes (Signed)
MD Beather Arbour aware of pt's calcium level.

## 2017-12-16 NOTE — ED Provider Notes (Signed)
[facility] Emergency Department Provider Note  Time seen: 5:25 AM  I have reviewed the triage vital signs and the nursing notes.   HISTORY  Chief Complaint abnormal labs    HPI Mary [name] is a 57 y.o. female with a past medical history of breast cancer status post mastectomy, anxiety, presents to the emergency department for elevated calcium level.  According to the patient she recently had blood work performed by her primary care doctor showing her kidney function has gone down.  They had the patient come back for repeat labs Friday patient was called back at 2:00 this morning informing her that her calcium was greater than 13 and she needed to go to the emergency department immediately.  Patient does state over the past several days she has been feeling very weak and tired.  States for the past 6 months she has been experiencing intermittent vertigo, her oncologist ordered an MRI of the brain back in May which was nonrevealing.  Patient also states right-sided abdominal pain fairly constant for the past several months.   Past Medical History:  Diagnosis Date  . Anxiety    takes Xanax prn  . Breast cancer [facility]) August 2013   bilateral, er/pr+  . Complication of anesthesia    pt states she had the shakes extremely bad  . Cough    at night d/t reflux;nothing productive  . Dental crowns present    x 4  . GERD (gastroesophageal reflux disease)    takes Nexium daily  . History of bronchitis    08/2011  . History of colon polyps   . Hot flashes, menopausal 08/08/2012  . IBS (irritable bowel syndrome)    immodium prn  . Insomnia    takes Ambien nightly  . Irritable bowel syndrome   . Neutropenia, drug-induced ([place]) 12/15/2011  . Nocturia   . Plantar fasciitis   . S/P radiation therapy 07/03/2012 through 08/18/2038                                                        Right chest wall/regional lymph nodes 5040 cGy 28 sessions, right chest  wall/mastectomy scar boost 1000 cGy 5 sessions                          . Status post chemotherapy    FEC 100 starting on 12/08/2011 - 01/18/12/single agent Taxol x12 weeks from 02/02/2012 through January 2014   . Urinary frequency   . Use of tamoxifen (Nolvadex)   . Vertigo     Patient Active Problem List   Diagnosis Date Noted  . Bilateral posterior neck pain 11/24/2017  . Elevated liver enzymes 11/24/2017  . Tubular adenoma of colon 10/11/2017  . 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 Generalized anxiety disorder 09/19/2017  . Vertigo, labyrinthine, bilateral 08/27/2017  . Prediabetes 08/27/2017  . Encounter for screening colonoscopy 09/21/2014  . Other malaise and fatigue 12/14/2013  . Hot flashes, menopausal 08/08/2012  . Neutropenia, drug-induced ([place]) 12/15/2011  . Primary cancer of upper outer quadrant of left female breast ([place]) 11/17/2011  . Hypertension 09/23/2011  . PLANTAR FASCIITIS, LEFT 12/01/2009  . UNEQUAL LEG LENGTH 12/01/2009    Past Surgical History:  Procedure Laterality Date  . BREAST LUMPECTOMY  2008   left  . COLONOSCOPY    . COLONOSCOPY WITH PROPOFOL N/A 10/10/2017   Procedure: COLONOSCOPY WITH PROPOFOL;  Surgeon: Lucilla Lame, MD;  Location: [facility] ENDOSCOPY;  Service: Endoscopy;  Laterality: N/A;  . MASTECTOMY    . MASTECTOMY WITH AXILLARY LYMPH NODE DISSECTION Right 05/18/2012   Procedure: MASTECTOMY WITH AXILLARY LYMPH NODE DISSECTION;  Surgeon: Adin Hector, MD;  Location: [place];  Service: General;  Laterality: Right;  Bilateral Total Mastectomy , right axillary lymph node dissection left axillary sentinel node biopsy, removal of Porta Cath  . PORT-A-CATH REMOVAL N/A 05/18/2012   Procedure: REMOVAL PORT-A-CATH;  Surgeon: Adin Hector, MD;  Location: [place];  Service: General;  Laterality: N/A;  . PORTACATH PLACEMENT  12/02/2011   Procedure: INSERTION PORT-A-CATH;  Surgeon: Adin Hector, MD;  Location: [place];   Service: General;  Laterality: N/A;  insertion port a cath with fluoroscopy  . SIMPLE MASTECTOMY WITH AXILLARY SENTINEL NODE BIOPSY Left 05/18/2012   Procedure: SIMPLE MASTECTOMY WITH AXILLARY SENTINEL NODE BIOPSY;  Surgeon: Adin Hector, MD;  Location: [place];  Service: General;  Laterality: Left;    Prior to Admission medications   Medication Sig Start Date End Date Taking? Authorizing Provider  amLODipine (NORVASC) 5 MG tablet TAKE 1 TABLET BY MOUTH DAILY. 09/27/17   [name], MD  Ascorbic Acid (VITAMIN C) 100 MG tablet Take 100 mg by mouth daily.      [provider]  Calcium Carbonate-Vitamin D (CALTRATE 600+D PO) Take 1 tablet by mouth daily.    [provider]  diazepam (VALIUM) 10 MG tablet Take 1 tablet (10 mg total) by mouth at bedtime as needed for anxiety. 09/18/17   [name], MD  esomeprazole (NEXIUM) 40 MG capsule Take 40 mg by mouth daily before breakfast.    [provider]  exemestane (AROMASIN) 25 MG tablet TAKE 1 TABLET BY MOUTH ONCE DAILY AFTER BREAKFAST 08/10/17   Nicholas Lose, MD  goserelin (ZOLADEX) 3.6 MG injection Inject 3.6 mg into the skin every 28 (twenty-eight) days.    [provider]  loperamide (IMODIUM A-D) 2 MG tablet Take 2 mg by mouth 4 (four) times daily as needed for diarrhea or loose stools.     [provider]  losartan-hydrochlorothiazide (HYZAAR) 50-12.5 MG tablet Take 1 tablet by mouth daily. 08/25/17   [name], MD  methocarbamol (ROBAXIN) 500 MG tablet Take 1 tablet (500 mg total) by mouth 4 (four) times daily. 11/22/17   [name], MD  Multiple Vitamin (MULTIVITAMIN) tablet Take 1 tablet by mouth daily.      [provider]  ondansetron (ZOFRAN) 4 MG tablet Take 1 tablet (4 mg total) by mouth every 8 (eight) hours as needed for nausea or vomiting. 11/22/17   [name], MD  predniSONE (DELTASONE) 10 MG tablet 6 tablets daily for 3 days, then reduce by 1 tablet daily until  gone 11/22/17   [name], MD  traZODone (DESYREL) 50 MG tablet Take 0.5-1 tablets (25-50 mg total) by mouth at bedtime as needed for sleep. 09/18/17   [name], MD    Allergies  Allergen Reactions  . Adhesive [Tape] Other (See Comments)    Redness:Please use paper tape    Family History  Problem Relation Age of Onset  . Hypertension Mother   . Hypertension Maternal Grandmother   . Pancreatic cancer Paternal Grandmother  diagnosed in her 21s  . Ovarian cancer Maternal Aunt        maternal grandmother's sister  . Breast cancer Paternal Aunt        diagnosed in late 27s early 5s  . Parkinsonism Paternal Grandfather     Social History Social History   Tobacco Use  . Smoking status: Former Smoker    Packs/day: 0.50    Last attempt to quit: 03/21/1997    Years since quitting: 20.7  . Smokeless tobacco: Never Used  Substance Use Topics  . Alcohol use: Yes    Alcohol/week: 0.0 standard drinks    Comment: occasionally wine  . Drug use: No    Review of Systems Constitutional: Positive for generalized weakness Cardiovascular: Negative for chest pain. Respiratory: Negative for shortness of breath. Gastrointestinal: Right-sided abdominal discomfort times several months Musculoskeletal: Negative for musculoskeletal complaints Neurological: Negative for headache All other ROS negative  ____________________________________________   PHYSICAL EXAM:  VITAL SIGNS: ED Triage Vitals  Enc Vitals Group     BP 12/16/17 0317 (!) 161/85     Pulse Rate 12/16/17 0317 100     Resp 12/16/17 0317 16     Temp 12/16/17 0317 98.1 F (36.7 C)     Temp Source 12/16/17 0317 Oral     SpO2 12/16/17 0317 96 %     Weight 12/16/17 0318 160 lb (72.6 kg)     Height 12/16/17 0318 5\' 4"  (1.626 m)     Head Circumference --      Peak Flow --      Pain Score 12/16/17 0318 0     Pain Loc --      Pain Edu? --      Excl. in [place]? --     Constitutional: Alert and oriented. Well  appearing and in no distress. Eyes: Normal exam ENT   Head: Normocephalic and atraumatic   Mouth/Throat: Mucous membranes are moist. Cardiovascular: Normal rate, regular rhythm. No murmur Respiratory: Normal respiratory effort without tachypnea nor retractions. Breath sounds are clear  Gastrointestinal: Soft, mild right lower quadrant and right mid abdominal discomfort to palpation.  No rebound guarding or distention. Musculoskeletal: Nontender with normal range of motion in all extremities. Neurologic:  Normal speech and language. No gross focal neurologic deficits  Skin:  Skin is warm, dry and intact.  Psychiatric: Mood and affect are normal.   ____________________________________________  RADIOLOGY  IMPRESSION: 1. Moderate bilateral hydronephrosis, likely related to strictures at the level of the UPJ's, and likely secondary to retroperitoneal adhesions or fibrosis. No obstructing stone. 2. Masslike thickening of the anterior and left lateral bladder wall suspicious for metastatic implant. 3. Mesenteric, omental, and peritoneal implants with a small ascites and diffuse mesenteric edema. 4. Small to moderate bilateral pleural effusions with associated compressive atelectasis of the lungs. 5. Diffuse small osseous sclerotic metastatic disease.  ____________________________________________   INITIAL IMPRESSION / ASSESSMENT AND PLAN / ED COURSE  Pertinent labs & imaging results that were available during my care of the patient were reviewed by me and considered in my medical decision making (see chart for details).  Patient presents to the emergency department for an elevated calcium level.  Differential would include lab error, hypercalcemia, hypercalcemia due to various causes including oncologic processes.  Patient's labs today show an elevated calcium of 13.9 corrected at slightly over 14.  We will start the patient on normal saline infusion, IM calcitonin given her renal  dysfunction and continue to closely monitor.  Given the patient's  right-sided abdominal discomfort times months we will obtain CT imaging to help rule out mass tumor or bone invading cancer.  Patient agreeable to plan of care.  Ultimately with a corrected calcium greater than 14 the patient will require hospital admission for further treatment.  Patient CT scan unfortunately shows hydronephrosis with static lesions likely around the bladder omentum and osseous lesions.  This would explain the patient's hypercalcemia as well as abdominal discomfort.  We will admit to the hospitalist service for continued treatment.  EKG reviewed and interpreted by myself shows normal sinus rhythm 85 bpm with a narrow QRS, normal axis, largely normal intervals with nonspecific ST changes. ____________________________________________   FINAL CLINICAL IMPRESSION(S) / ED DIAGNOSES  Hypercalcemia Metastatic disease   Harvest Dark, MD 12/17/17 [id number]

## 2017-12-16 NOTE — Progress Notes (Signed)
Admitted this morning for hypercalcemia, patient has history of breast cancer little mastectomy, getting chemotherapy and patient also getting radiation therapy. Lab data, treatment plan reviewed.  Patient assessment and plan 1.  Acute hyper thalassemia likely secondary to malignancy; receiving aggressive hydration, received a dose of Zometa, calcitonin in the emergency room.  Continue IV fluids, recheck calcium tomorrow.  Hold HCTZ and losartan at this time. 2.  Hypokalemia: Replace the potassium. 3.  Abdominal pain the patient with breast cancer on chemo, radiation: CT abdomen concerning for moderate bilateral hydronephrosis, stricture at UPJ junction, the peritoneal adhesions, urology, oncology is consulted, has chronic kidney disease, renal function is fairly stable at this time.

## 2017-12-17 ENCOUNTER — Inpatient Hospital Stay: Payer: 59 | Admitting: Anesthesiology

## 2017-12-17 ENCOUNTER — Encounter
Admission: EM | Disposition: A | Discharge status: Home / Self Care | Payer: Self-pay | Source: Home / Self Care | Attending: Internal Medicine

## 2017-12-17 DIAGNOSIS — D494 Neoplasm of unspecified behavior of bladder: Secondary | ICD-10-CM | POA: Diagnosis not present

## 2017-12-17 DIAGNOSIS — N1330 Unspecified hydronephrosis: Secondary | ICD-10-CM | POA: Diagnosis not present

## 2017-12-17 HISTORY — PX: CYSTOSCOPY WITH STENT PLACEMENT: SHX5790

## 2017-12-17 LAB — BASIC METABOLIC PANEL
Anion gap: 8 (ref 5–15)
BUN: 19 mg/dL (ref 6–20)
CHLORIDE: 105 mmol/L (ref 98–111)
CO2: 26 mmol/L (ref 22–32)
CREATININE: 1.4 mg/dL — AB (ref 0.44–1.00)
Calcium: 10.8 mg/dL — ABNORMAL HIGH (ref 8.9–10.3)
GFR calc Af Amer: 48 mL/min — ABNORMAL LOW (ref >60)
GFR calc non Af Amer: 41 mL/min — ABNORMAL LOW (ref >60)
Glucose, Bld: 106 mg/dL — ABNORMAL HIGH (ref 70–99)
Potassium: 3 mmol/L — ABNORMAL LOW (ref 3.5–5.1)
SODIUM: 139 mmol/L (ref 135–145)

## 2017-12-17 LAB — CBC
HCT: 33.4 % — ABNORMAL LOW (ref 35.0–47.0)
HEMOGLOBIN: 11.8 g/dL — AB (ref 12.0–16.0)
MCH: 32.3 pg (ref 26.0–34.0)
MCHC: 35.3 g/dL (ref 32.0–36.0)
MCV: 91.5 fL (ref 80.0–100.0)
PLATELETS: 205 10*3/uL (ref 150–440)
RBC: 3.65 MIL/uL — AB (ref 3.80–5.20)
RDW: 12.4 % (ref 11.5–14.5)
WBC: 7 10*3/uL (ref 3.6–11.0)

## 2017-12-17 LAB — HEPARIN LEVEL (UNFRACTIONATED): HEPARIN UNFRACTIONATED: 0.42 [IU]/mL (ref 0.30–0.70)

## 2017-12-17 SURGERY — CYSTOSCOPY, WITH STENT INSERTION
Anesthesia: General | Laterality: Bilateral

## 2017-12-17 MED ORDER — FENTANYL CITRATE (PF) 100 MCG/2ML IJ SOLN
INTRAMUSCULAR | Status: AC
  Filled 2017-12-17: qty 2

## 2017-12-17 MED ORDER — FENTANYL CITRATE (PF) 100 MCG/2ML IJ SOLN
INTRAMUSCULAR | Status: DC | PRN
  Administered 2017-12-17 (×2): 50 ug via INTRAVENOUS

## 2017-12-17 MED ORDER — LIDOCAINE HCL (CARDIAC) PF 100 MG/5ML IV SOSY
PREFILLED_SYRINGE | INTRAVENOUS | Status: DC | PRN
  Administered 2017-12-17: 60 mg via INTRAVENOUS

## 2017-12-17 MED ORDER — MIDAZOLAM HCL 2 MG/2ML IJ SOLN
INTRAMUSCULAR | Status: DC | PRN
  Administered 2017-12-17: 2 mg via INTRAVENOUS

## 2017-12-17 MED ORDER — PROPOFOL 10 MG/ML IV BOLUS
INTRAVENOUS | Status: AC
  Filled 2017-12-17: qty 20

## 2017-12-17 MED ORDER — OXYBUTYNIN CHLORIDE 5 MG PO TABS
5.0000 mg | ORAL_TABLET | Freq: Three times a day (TID) | ORAL | Status: DC | PRN
  Administered 2017-12-18 – 2017-12-20 (×6): 5 mg via ORAL
  Filled 2017-12-17 (×6): qty 1

## 2017-12-17 MED ORDER — ONDANSETRON HCL 4 MG/2ML IJ SOLN: 4.0000 mg | Freq: Once | INTRAMUSCULAR | Status: DC | PRN

## 2017-12-17 MED ORDER — IOPAMIDOL (ISOVUE-200) INJECTION 41%
INTRAVENOUS | Status: DC | PRN
  Administered 2017-12-17: 50 mL

## 2017-12-17 MED ORDER — FENTANYL CITRATE (PF) 100 MCG/2ML IJ SOLN: 25.0000 ug | INTRAMUSCULAR | Status: DC | PRN

## 2017-12-17 MED ORDER — PROPOFOL 10 MG/ML IV BOLUS
INTRAVENOUS | Status: DC | PRN
  Administered 2017-12-17: 110 mg via INTRAVENOUS

## 2017-12-17 MED ORDER — MIDAZOLAM HCL 2 MG/2ML IJ SOLN
INTRAMUSCULAR | Status: AC
  Filled 2017-12-17: qty 2

## 2017-12-17 MED ORDER — CEFAZOLIN SODIUM-DEXTROSE 2-3 GM-%(50ML) IV SOLR
INTRAVENOUS | Status: DC | PRN
  Administered 2017-12-17: 2 g via INTRAVENOUS

## 2017-12-17 MED ORDER — DEXAMETHASONE SODIUM PHOSPHATE 10 MG/ML IJ SOLN
INTRAMUSCULAR | Status: DC | PRN
  Administered 2017-12-17: 5 mg via INTRAVENOUS

## 2017-12-17 MED ORDER — BELLADONNA ALKALOIDS-OPIUM 16.2-60 MG RE SUPP: 1.0000 | Freq: Three times a day (TID) | RECTAL | Status: DC | PRN

## 2017-12-17 SURGICAL SUPPLY — 23 items
BAG DRAIN CYSTO-URO LG1000N (MISCELLANEOUS) ×2 IMPLANT
BRUSH SCRUB EZ  4% CHG (MISCELLANEOUS)
BRUSH SCRUB EZ 4% CHG (MISCELLANEOUS) IMPLANT
Bard Inlay Optima ureteral stent 6x24 ×2 IMPLANT
Bard inlay Optima ureteral stent 6x24 ×2 IMPLANT
CATH URETL 5X70 OPEN END (CATHETERS) ×2 IMPLANT
CONRAY 43 FOR UROLOGY 50M (MISCELLANEOUS) ×2 IMPLANT
GLOVE BIO SURGEON STRL SZ 6.5 (GLOVE) ×2 IMPLANT
GOWN STRL REUS W/ TWL LRG LVL3 (GOWN DISPOSABLE) ×2 IMPLANT
GOWN STRL REUS W/TWL LRG LVL3 (GOWN DISPOSABLE) ×2
KIT TURNOVER CYSTO (KITS) ×2 IMPLANT
PACK CYSTO AR (MISCELLANEOUS) ×2 IMPLANT
SENSORWIRE 0.038 NOT ANGLED (WIRE) ×2
SET CYSTO W/LG BORE CLAMP LF (SET/KITS/TRAYS/PACK) ×2 IMPLANT
SOL .9 NS 3000ML IRR  AL (IV SOLUTION) ×1
SOL .9 NS 3000ML IRR UROMATIC (IV SOLUTION) ×1 IMPLANT
STENT URET 6FRX24 CONTOUR (STENTS) IMPLANT
STENT URET 6FRX26 CONTOUR (STENTS) IMPLANT
STENT URO INLAY 6FRX24CM (STENTS) ×4 IMPLANT
SURGILUBE 2OZ TUBE FLIPTOP (MISCELLANEOUS) ×2 IMPLANT
SYRINGE IRR TOOMEY STRL 70CC (SYRINGE) ×2 IMPLANT
WATER STERILE IRR 1000ML POUR (IV SOLUTION) ×2 IMPLANT
WIRE SENSOR 0.038 NOT ANGLED (WIRE) ×1 IMPLANT

## 2017-12-17 NOTE — Progress Notes (Signed)
**Note Mary-Identified via Obfuscation** Patient ID: Mary [name], female   DOB: 1960-09-21, 57 y.o.   MRN: [id number]  [employer] PROGRESS NOTE  Mary [name] CLE:[id number] DOB: 1960/12/13 DOA: 12/16/2017 PCP: [name], MD  HPI/Subjective: Patient feels dizzy a lot.  Had some abdominal pain.  Patient seen before she went down for urology procedure for bilateral hydronephrosis.  Objective: Vitals:   12/17/17 1249 12/17/17 1341  BP: (!) 159/87 (!) 160/91  Pulse: 95 92  Resp: (!) 24   Temp:  98.3 F (36.8 C)  SpO2: 95% 93%    Filed Weights   12/16/17 0318 12/16/17 1225  Weight: 72.6 kg 72.5 kg    ROS: Review of Systems  Constitutional: Negative for chills and fever.  Eyes: Negative for blurred vision.  Respiratory: Negative for cough and shortness of breath.   Cardiovascular: Negative for chest pain.  Gastrointestinal: Positive for abdominal pain. Negative for constipation, diarrhea, nausea and vomiting.  Genitourinary: Negative for dysuria.  Musculoskeletal: Negative for joint pain.  Neurological: Positive for dizziness. Negative for headaches.   Exam: Physical Exam  Constitutional: She is oriented to person, place, and time.  HENT:  Nose: No mucosal edema.  Mouth/Throat: No oropharyngeal exudate or posterior oropharyngeal edema.  Eyes: Pupils are equal, round, and reactive to light. Conjunctivae, EOM and lids are normal.  Neck: No JVD present. Carotid bruit is not present. No edema present. No thyroid mass and no thyromegaly present.  Cardiovascular: S1 normal and S2 normal. Exam reveals no gallop.  No murmur heard. Pulses:      Dorsalis pedis pulses are 2+ on the right side, and 2+ on the left side.  Respiratory: No respiratory distress. She has no wheezes. She has no rhonchi. She has no rales.  GI: Soft. Bowel sounds are normal. There is tenderness.  Musculoskeletal:       Right ankle: She exhibits no swelling.       Left ankle: She exhibits no swelling.  Lymphadenopathy:    She  has no cervical adenopathy.  Neurological: She is alert and oriented to person, place, and time. No cranial nerve deficit.  Skin: Skin is warm. No rash noted. Nails show no clubbing.  Psychiatric: She has a normal mood and affect.      Data Reviewed: Basic Metabolic Panel: Recent Labs  Lab 12/15/17 1525 12/16/17 0332 12/16/17 1438 12/17/17 0458  NA 138 139  --  139  K 3.2* 3.0*  --  3.0*  CL 98 102  --  105  CO2 30 28  --  26  GLUCOSE 122* 113*  --  106*  BUN 30* 30*  --  19  CREATININE 1.58* 1.63*  --  1.40*  CALCIUM 13.1* 13.9* 12.2* 10.8*  MG  --  2.1  --   --   PHOS  --  3.1  --   --    Liver Function Tests: Recent Labs  Lab 12/15/17 1525 12/16/17 0332  AST 36* 39  ALT 37* 43  ALKPHOS  --  110  BILITOT 0.3 0.6  PROT 6.4 6.9  ALBUMIN  --  3.8   CBC: Recent Labs  Lab 12/16/17 0332 12/17/17 0458  WBC 7.6 7.0  HGB 13.0 11.8*  HCT 37.5 33.4*  MCV 91.3 91.5  PLT 201 205     Studies: US Venous Img Lower Unilateral Left  Result Date: 12/16/2017 CLINICAL DATA:  Lower extremity edema EXAM: LEFT LOWER EXTREMITY VENOUS DUPLEX ULTRASOUND TECHNIQUE: Doppler venous assessment of the left lower  extremity deep venous system was performed, including characterization of spectral flow, compressibility, and phasicity. COMPARISON:  None. FINDINGS: There is complete compressibility of the left common femoral and femoral veins. The left popliteal vein is partially occlusive and contains partially occlusive DVT. Thrombus is mixed echogenicity with areas of hypoechoic and hyperechoic signal. There is occlusive thrombus in the posterior tibial and peroneal veins. Doppler analysis demonstrates respiratory phasicity and augmentation. IMPRESSION: The study is positive for DVT in the popliteal, posterior tibial, and peroneal veins as described. Critical Value/emergent results were called by telephone at the time of interpretation on 12/16/2017 at 9:24 am to Dr. Alfred Levins, who verbally  acknowledged these results. Electronically Signed   By: Marybelle Killings M.D.   On: 12/16/2017 09:25   Ct Renal Stone Study  Result Date: 12/16/2017 CLINICAL DATA:  57 year old female with abdominal pain. History of breast cancer and bilateral mastectomy. Patient is on chemo and radiation treatment. EXAM: CT ABDOMEN AND PELVIS WITHOUT CONTRAST TECHNIQUE: Multidetector CT imaging of the abdomen and pelvis was performed following the standard protocol without IV contrast. COMPARISON:  PET-CT dated 12/01/2011 FINDINGS: Evaluation of this exam is limited in the absence of intravenous contrast. Lower chest: Partially visualized small to moderate bilateral pleural effusions with associated partial compressive atelectasis of the adjacent lungs and areas of linear atelectasis/scarring. No intra-abdominal free air. Diffuse mesenteric edema and stranding and small ascites. Hepatobiliary: Subcentimeter hypodense focus in the right lobe of the liver is not characterized. The liver is otherwise unremarkable on this noncontrast CT. Probable tiny stone or sludge within the gallbladder. Pancreas: Unremarkable. No pancreatic ductal dilatation or surrounding inflammatory changes. Spleen: Normal in size without focal abnormality. Adrenals/Urinary Tract: Mild bilateral adrenal thickening. There is moderate bilateral hydronephrosis. The ureters are nondistended. The hydronephrosis is likely related to a degree of stricture at the ureteropelvic junctions secondary to retroperitoneal adhesions/fibrosis. No stone identified within the kidneys or along the course of the ureters. There is masslike thickening of the anterior and left lateral bladder wall likely metastatic disease. Stomach/Bowel: There is diffuse thickening of the stomach which may be partly related to underdistention and ascites. Gastritis or an infiltrative process is not entirely excluded. There is no bowel obstruction. The appendix is normal as visualized.  Vascular/Lymphatic: Mild aortoiliac atherosclerotic disease. The abdominal aorta and IVC are grossly unremarkable on this noncontrast CT. No portal venous gas. There are multiple somewhat small retroperitoneal and para-aortic lymph nodes. A mildly enlarged right iliac chain node measures 11 mm in short axis. There is diffuse mesenteric and omental nodularity and edema consistent with metastatic implants. Multiple top-normal and mildly rounded mesenteric lymph nodes measure up to 8 mm in short axis. Reproductive: Evaluation of the pelvic structures is very limited. The uterus appears retroflexed. A 3.3 x 3.9 x 2.6 cm ill-defined high attenuating solid lesion to the right of the uterine fundus is not well evaluated and may represent a fibroid or a complex lesion arising from the right adnexa. However, a neoplastic mass is not excluded. Pelvic ultrasound may provide better evaluation. Other: There is mild diffuse subcutaneous edema of the abdominal wall. Musculoskeletal: Multiple small scattered sclerotic lesions throughout the spine most consistent with metastatic disease. No acute fracture. IMPRESSION: 1. Moderate bilateral hydronephrosis, likely related to strictures at the level of the UPJ's, and likely secondary to retroperitoneal adhesions or fibrosis. No obstructing stone. 2. Masslike thickening of the anterior and left lateral bladder wall suspicious for metastatic implant. 3. Mesenteric, omental, and peritoneal implants with a small  ascites and diffuse mesenteric edema. 4. Small to moderate bilateral pleural effusions with associated compressive atelectasis of the lungs. 5. Diffuse small osseous sclerotic metastatic disease. Electronically Signed   By: Anner Crete M.D.   On: 12/16/2017 06:53    Scheduled Meds: . amLODipine  5 mg Oral Daily  . exemestane  25 mg Oral QPC breakfast  . Influenza vac split quadrivalent PF  0.5 mL Intramuscular Tomorrow-1000  . pantoprazole  40 mg Oral Daily  .  scopolamine  1 patch Transdermal Q72H   Continuous Infusions: . sodium chloride 150 mL/hr at 12/17/17 1322  . heparin 1,100 Units/hr (12/17/17 1347)    Assessment/Plan:  1. Severe hypercalcemia.  IV fluid hydration.  Patient received a dose of Zometa.  Hold hydrochlorothiazide 2. Metastatic breast cancer.  Follow-up with oncology. 3. Acute kidney injury with bilateral hydronephrosis, bladder mass.  Urology put into stents and took biopsies. 4. Acute DVT left lower extremity.  Restarting heparin drip. 5. Dizziness likely inner ear issue.  Tympanic membrane normal bilaterally.  Oncology to order an MRI of the brain to rule out metastases.  Code Status:     Code Status Orders  (From admission, onward)         Start     Ordered   12/16/17 1248  Full code  Continuous     12/16/17 1247        Code Status History    Date Active Date Inactive Code Status Order ID Comments User Context   05/18/2012 1813 05/20/2012 1306 Full Code [id number]  Adin Hector, MD Inpatient    Advance Directive Documentation     Most Recent Value  Type of Advance Directive  Living will  Pre-existing out of facility DNR order (yellow form or pink MOST form)  -  "MOST" Form in Place?  -     Family Communication: Family at bedside Disposition Plan: To be determined  Consultants:  Neurology  Oncology  Time spent: 28 minutes  [place]

## 2017-12-17 NOTE — Plan of Care (Signed)

## 2017-12-17 NOTE — Progress Notes (Signed)
Placed patient on bedpan per request, feels the need to void.

## 2017-12-17 NOTE — Op Note (Signed)
Date of procedure: 12/17/17  Preoperative diagnosis:  1. Bilateral hydronephrosis 2. Metastatic malignancy, presumably breast cancer 3. Infiltrative bladder mass  Postoperative diagnosis:  1. Same as above  Procedure: 1. Cystoscopy 2. Bilateral retrograde pyelogram 3. Bilateral ureteral stent placement 4. Bladder biopsy  Surgeon: Hollice Espy, MD  Anesthesia: General  Complications: None  Intraoperative findings: High-grade ureteral obstruction with moderately severe hydronephrosis bilaterally, transition point on the right side within the proximal ureter and near the level of the pelvic sidewall on the left.  Grossly abnormal anterior bladder wall extending to left lateral bladder wall and bladder neck.  EBL: Minimal  Specimens: Bladder biopsy, anterior wall  Drains: 6 x 24 French double-J ureteral stent, Bard optima bilaterally  Indication: Mary [name] is a 57 y.o. patient with personal history of breast cancer admitted with hypercalcemia and concern for widely metastatic disease with bladder involvement and bilateral hydronephrosis with acute kidney injury.  After reviewing the management options for treatment, she elected to proceed with the above surgical procedure(s). We have discussed the potential benefits and risks of the procedure, side effects of the proposed treatment, the likelihood of the patient achieving the goals of the procedure, and any potential problems that might occur during the procedure or recuperation. Informed consent has been obtained.  She was seen on the morning of surgery again and all additional questions were answered.  Description of procedure:  The patient was taken to the operating room and general anesthesia was induced.  The patient was placed in the dorsal lithotomy position, prepped and draped in the usual sterile fashion, and preoperative antibiotics were administered. A preoperative time-out was performed.   A 21 French scope was  advanced per urethra into the bladder.  Bladder was carefully inspected and noted to be grossly abnormal with some bullous friable mucosa with a somewhat infiltrative appearance involving the anterior bladder wall extending to the left lateral bladder wall and bladder neck on that side.  This is concerning for infiltrative tumor and did not have a traditional appearance of transitional cell carcinoma.  Attention was first turned to the right ureteral orifice.  It was cannulated using a 5 [place] open-ended ureteral catheter.  Gentle retrograde pyelogram on the side revealed a relatively decompressed mid and distal ureter but there was a fairly significant transition point within the proximal ureter with moderate hydro-ureteronephrosis down to this level.  A wire was able to be placed up to level the kidney without difficulty.  A 6 x 24 French double-J ureteral stent was then advanced over the wire up to level the renal pelvis.  The wires partially drawn until focal stone within the renal pelvis.  The wires and fully withdrawn a full coils over the bladder.  Attention was then turned to the left ureteral orifice which was somewhat distorted and location due to irregularity of the bladder neck.  I was able to identify the orifice and cannulated with a 5 [place] open-ended ureteral catheter.  On the side, the ureter was decompressed up to level of the left iliac/pelvic sidewall where it was a nearly complete obstruction on the side.  Contrast did not reflux above this level.  Ultimately, I was able to place a wire up to the renal pelvis and advanced open-ended, 5 [place] about to the level of the proximal ureter where additional contrast was injected and this revealed severe hydronephrosis with caliectasis on the side.  The wire was replaced a 5 [place] open-ended ureteral catheter was removed.  A  6 x 24 French double-J ureteral stent was advanced over this wire up to level the renal pelvis partially drawn until focal  is noted both within the renal pelvis and then ultimately withdrawn until full coil over the bladder.    Finally, cup biopsy forceps were used to take for 3 representative samples of the anterior lateral wall.  When adequate amount of tissue was obtained, electrocautery was used to achieve careful and meticulous hemostasis and she will be resumed on anticoagulation postoperatively.  Once this was completed, the bladder was drained, the patient was clean and dry, repositioned supine position, reversed from anesthesia, and taken to PACU in stable condition.  Plan: We will continue to trend her creatinine.  She is at risk for stent failure high-grade obstruction.  Follow-up pathology for bladder biopsies.  Okay to resume heparin drip.  Diet may be advanced.  Hollice Espy, M.D.

## 2017-12-17 NOTE — Progress Notes (Signed)
Patient assisted to restroom in PACU to void. No distress noted or voiced.

## 2017-12-17 NOTE — Anesthesia Preprocedure Evaluation (Signed)
Anesthesia Evaluation  Patient identified by MRN, date of birth, ID band Patient awake    Reviewed: Allergy & Precautions, NPO status , Patient's Chart, lab work & pertinent test results  History of Anesthesia Complications Negative for: history of anesthetic complications  Airway Mallampati: III       Dental   Pulmonary neg sleep apnea, neg COPD, former smoker,           Cardiovascular hypertension, Pt. on medications (-) Past MI and (-) CHF (-) dysrhythmias (-) Valvular Problems/Murmurs     Neuro/Psych neg Seizures Anxiety    GI/Hepatic Neg liver ROS, GERD  Medicated and Controlled,  Endo/Other  neg diabetes  Renal/GU Renal disease (B hydroneprhosis)     Musculoskeletal   Abdominal   Peds  Hematology   Anesthesia Other Findings   Reproductive/Obstetrics                             Anesthesia Physical Anesthesia Plan  ASA: II and emergent  Anesthesia Plan: General   Post-op Pain Management:    Induction:   PONV Risk Score and Plan: Dexamethasone, Ondansetron and Midazolam  Airway Management Planned: LMA  Additional Equipment:   Intra-op Plan:   Post-operative Plan:   Informed Consent: I have reviewed the patients History and Physical, chart, labs and discussed the procedure including the risks, benefits and alternatives for the proposed anesthesia with the patient or authorized representative who has indicated his/her understanding and acceptance.     Plan Discussed with:   Anesthesia Plan Comments:         Anesthesia Quick Evaluation

## 2017-12-17 NOTE — Anesthesia Postprocedure Evaluation (Signed)
Anesthesia Post Note  Patient: Mary [name]  Procedure(s) Performed: CYSTOSCOPY WITH STENT PLACEMENT (Bilateral )  Patient location during evaluation: PACU Anesthesia Type: General Level of consciousness: awake and alert Pain management: pain level controlled Vital Signs Assessment: post-procedure vital signs reviewed and stable Respiratory status: spontaneous breathing and respiratory function stable Cardiovascular status: stable Anesthetic complications: no     Last Vitals:  Vitals:   12/17/17 0741 12/17/17 1218  BP: (!) 152/83 129/84  Pulse: 96 93  Resp: 19 (!) 24  Temp: 37 C 36.8 C  SpO2: 91% 93%    Last Pain:  Vitals:   12/17/17 0852  TempSrc:   PainSc: 3                  Matin Mattioli K

## 2017-12-17 NOTE — Anesthesia Post-op Follow-up Note (Signed)
Anesthesia QCDR form completed.        

## 2017-12-17 NOTE — Progress Notes (Signed)
ANTICOAGULATION CONSULT NOTE - Initial Consult  Pharmacy Consult for Heparin  Indication: chest pain/ACS  Allergies  Allergen Reactions  . Adhesive [Tape] Other (See Comments)    Redness:Please use paper tape    Patient Measurements: Height: 5\' 4"  (162.6 cm) Weight: 159 lb 12.8 oz (72.5 kg) IBW/kg (Calculated) : 54.7 Heparin Dosing Weight:   Vital Signs: Temp: 98.8 F (37.1 C) (09/29 0257) Temp Source: Oral (09/29 0257) BP: 148/83 (09/29 0257) Pulse Rate: 95 (09/29 0257)  Labs: Recent Labs    12/15/17 1525 12/16/17 0332 12/16/17 1343 12/16/17 2047 12/17/17 0458  HGB  --  13.0  --   --  11.8*  HCT  --  37.5  --   --  33.4*  PLT  --  201  --   --  205  APTT  --   --  27  --   --   LABPROT  --   --  13.2  --   --   INR  --   --  1.01  --   --   HEPARINUNFRC  --   --   --  0.46 0.42  CREATININE 1.58* 1.63*  --   --  1.40*    Estimated Creatinine Clearance: 43.8 mL/min (A) (by C-G formula based on SCr of 1.4 mg/dL (H)).   Medical History: Past Medical History:  Diagnosis Date  . Anxiety    takes Xanax prn  . Breast cancer [facility]) August 2013   bilateral, er/pr+  . Complication of anesthesia    pt states she had the shakes extremely bad  . Cough    at night d/t reflux;nothing productive  . Dental crowns present    x 4  . GERD (gastroesophageal reflux disease)    takes Nexium daily  . History of bronchitis    08/2011  . History of colon polyps   . Hot flashes, menopausal 08/08/2012  . IBS (irritable bowel syndrome)    immodium prn  . Insomnia    takes Ambien nightly  . Irritable bowel syndrome   . Neutropenia, drug-induced ([facility]) 12/15/2011  . Nocturia   . Plantar fasciitis   . S/P radiation therapy 07/03/2012 through 08/18/2038                                                        Right chest wall/regional lymph nodes 5040 cGy 28 sessions, right chest wall/mastectomy scar boost 1000 cGy 5 sessions                          . Status post chemotherapy    FEC 100 starting on 12/08/2011 - 01/18/12/single agent Taxol x12 weeks from 02/02/2012 through January 2014   . Urinary frequency   . Use of tamoxifen (Nolvadex)   . Vertigo     Medications:  Medications Prior to Admission  Medication Sig Dispense Refill Last Dose  . amLODipine (NORVASC) 5 MG tablet TAKE 1 TABLET BY MOUTH DAILY. 90 tablet 1 Past Week at Unknown time  . Ascorbic Acid (VITAMIN C) 100 MG tablet Take 100 mg by mouth daily.     Past Week at Unknown time  . Calcium Carbonate-Vitamin D (CALTRATE 600+D PO) Take 1 tablet by mouth daily.   Past Week at Unknown time  . diazepam (  VALIUM) 10 MG tablet Take 1 tablet (10 mg total) by mouth at bedtime as needed for anxiety. 30 tablet 5 prn at prn  . esomeprazole (NEXIUM) 40 MG capsule Take 40 mg by mouth daily before breakfast.   Past Week at Unknown time  . exemestane (AROMASIN) 25 MG tablet TAKE 1 TABLET BY MOUTH ONCE DAILY AFTER BREAKFAST 90 tablet 3 Past Week at Unknown time  . goserelin (ZOLADEX) 3.6 MG injection Inject 3.6 mg into the skin every 28 (twenty-eight) days.   Past Week at Unknown time  . loperamide (IMODIUM A-D) 2 MG tablet Take 2 mg by mouth 4 (four) times daily as needed for diarrhea or loose stools.    Past Week at Unknown time  . losartan-hydrochlorothiazide (HYZAAR) 50-12.5 MG tablet Take 1 tablet by mouth daily. 90 tablet 0 Past Week at Unknown time  . methocarbamol (ROBAXIN) 500 MG tablet Take 1 tablet (500 mg total) by mouth 4 (four) times daily. 90 tablet 2 Past Week at Unknown time  . Multiple Vitamin (MULTIVITAMIN) tablet Take 1 tablet by mouth daily.     Past Week at Unknown time  . ondansetron (ZOFRAN) 4 MG tablet Take 1 tablet (4 mg total) by mouth every 8 (eight) hours as needed for nausea or vomiting. 20 tablet 0 Past Month at Unknown time  . traZODone (DESYREL) 50 MG tablet Take 0.5-1 tablets (25-50 mg total) by mouth at bedtime as needed for sleep. 90 tablet 1 Past Week at Unknown time  . predniSONE  (DELTASONE) 10 MG tablet 6 tablets daily for 3 days, then reduce by 1 tablet daily until gone (Patient not taking: Reported on 12/16/2017) 21 tablet 0 Completed Course at Unknown time   Assessment: Pharmacy consulted for heparin drip dosing and monitoring in 57 yo female admitted with Hypercalcemia and found to have DVT.   Goal of Therapy:  Heparin level 0.3-0.7 units/ml Monitor platelets by anticoagulation protocol: Yes   Plan:  09/29 @ 0500 HL 0.42 therapeutic. Will continue current rate and will recheck HL w/ am labs.  Tobie Lords, PharmD, BCPS Clinical Pharmacist 12/17/2017

## 2017-12-17 NOTE — Progress Notes (Signed)
Hematology/Oncology Progress Note [facility] [phone] [phone]  Patient Care Team: [name], MD as PCP - General (Internal Medicine)   Name of the patient: Mary [name]  [id number]  02/24/1961  Date of visit: 12/17/17   INTERVAL HISTORY-  No acute overnight events.  Still feels dizzy.  S/p cystoscopy today by Dr.Brandon. Feels some lower abd cramps.  Multiple family members at bedside.   Review of systems- Review of Systems  Constitutional: Negative for chills and fever.  HENT: Negative for sore throat.   Eyes: Negative for redness.  Respiratory: Negative for cough, shortness of breath and wheezing.   Cardiovascular: Negative for chest pain, palpitations and orthopnea.  Gastrointestinal: Positive for abdominal pain. Negative for blood in stool, nausea and vomiting.  Genitourinary: Negative for dysuria.  Musculoskeletal: Negative for myalgias.  Neurological: Positive for dizziness. Negative for tingling and tremors.  Endo/Heme/Allergies: Negative for environmental allergies. Does not bruise/bleed easily.  Psychiatric/Behavioral: Negative for depression.    Allergies  Allergen Reactions  . Adhesive [Tape] Other (See Comments)    Redness:Please use paper tape    Patient Active Problem List   Diagnosis Date Noted  . Hypercalcemia 12/16/2017  . Malignancy ([place])   . Acute deep vein thrombosis (DVT) of distal vein of left lower extremity ([place])   . Dizziness   . Bilateral posterior neck pain 11/24/2017  . Elevated liver enzymes 11/24/2017  . Tubular adenoma of colon 10/11/2017  . 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 Generalized anxiety disorder 09/19/2017  . Vertigo, labyrinthine, bilateral 08/27/2017  . Prediabetes 08/27/2017  . Encounter for screening colonoscopy 09/21/2014  . Other malaise and fatigue 12/14/2013  . Hot flashes, menopausal 08/08/2012  . Neutropenia, drug-induced ([place]) 12/15/2011  .  Primary cancer of upper outer quadrant of left female breast ([place]) 11/17/2011  . Hypertension 09/23/2011  . PLANTAR FASCIITIS, LEFT 12/01/2009  . UNEQUAL LEG LENGTH 12/01/2009     Past Medical History:  Diagnosis Date  . Anxiety    takes Xanax prn  . Breast cancer [facility]) August 2013   bilateral, er/pr+  . Complication of anesthesia    pt states she had the shakes extremely bad  . Cough    at night d/t reflux;nothing productive  . Dental crowns present    x 4  . GERD (gastroesophageal reflux disease)    takes Nexium daily  . History of bronchitis    08/2011  . History of colon polyps   . Hot flashes, menopausal 08/08/2012  . IBS (irritable bowel syndrome)    immodium prn  . Insomnia    takes Ambien nightly  . Irritable bowel syndrome   . Neutropenia, drug-induced ([place]) 12/15/2011  . Nocturia   . Plantar fasciitis   . S/P radiation therapy 07/03/2012 through 08/18/2038                                                        Right chest wall/regional lymph nodes 5040 cGy 28 sessions, right chest wall/mastectomy scar boost 1000 cGy 5 sessions                          . Status post chemotherapy    FEC 100 starting on 12/08/2011 - 01/18/12/single agent Taxol x12 weeks from  02/02/2012 through January 2014   . Urinary frequency   . Use of tamoxifen (Nolvadex)   . Vertigo      Past Surgical History:  Procedure Laterality Date  . BREAST LUMPECTOMY  2008   left  . COLONOSCOPY    . COLONOSCOPY WITH PROPOFOL N/A 10/10/2017   Procedure: COLONOSCOPY WITH PROPOFOL;  Surgeon: Lucilla Lame, MD;  Location: [facility] ENDOSCOPY;  Service: Endoscopy;  Laterality: N/A;  . MASTECTOMY    . MASTECTOMY WITH AXILLARY LYMPH NODE DISSECTION Right 05/18/2012   Procedure: MASTECTOMY WITH AXILLARY LYMPH NODE DISSECTION;  Surgeon: Adin Hector, MD;  Location: [place];  Service: General;  Laterality: Right;  Bilateral Total Mastectomy , right axillary lymph node dissection left axillary sentinel node biopsy,  removal of Porta Cath  . PORT-A-CATH REMOVAL N/A 05/18/2012   Procedure: REMOVAL PORT-A-CATH;  Surgeon: Adin Hector, MD;  Location: [place];  Service: General;  Laterality: N/A;  . PORTACATH PLACEMENT  12/02/2011   Procedure: INSERTION PORT-A-CATH;  Surgeon: Adin Hector, MD;  Location: [place];  Service: General;  Laterality: N/A;  insertion port a cath with fluoroscopy  . SIMPLE MASTECTOMY WITH AXILLARY SENTINEL NODE BIOPSY Left 05/18/2012   Procedure: SIMPLE MASTECTOMY WITH AXILLARY SENTINEL NODE BIOPSY;  Surgeon: Adin Hector, MD;  Location: [place];  Service: General;  Laterality: Left;    Social History   Socioeconomic History  . Marital status: Married    Spouse name: Not on file  . Number of children: Not on file  . Years of education: Not on file  . Highest education level: Not on file  Occupational History  . Occupation: Network engineer II    Employer: [employer]  Social Needs  . Financial resource strain: Not on file  . Food insecurity:    Worry: Not on file    Inability: Not on file  . Transportation needs:    Medical: Not on file    Non-medical: Not on file  Tobacco Use  . Smoking status: Former Smoker    Packs/day: 0.50    Last attempt to quit: 03/21/1997    Years since quitting: 20.7  . Smokeless tobacco: Never Used  Substance and Sexual Activity  . Alcohol use: Yes    Alcohol/week: 0.0 standard drinks    Comment: occasionally wine  . Drug use: No  . Sexual activity: Yes    Birth control/protection: None  Lifestyle  . Physical activity:    Days per week: Not on file    Minutes per session: Not on file  . Stress: Not on file  Relationships  . Social connections:    Talks on phone: Not on file    Gets together: Not on file    Attends religious service: Not on file    Active member of club or organization: Not on file    Attends meetings of clubs or organizations: Not on file    Relationship status: Not on file  . Intimate partner  violence:    Fear of current or ex partner: Not on file    Emotionally abused: Not on file    Physically abused: Not on file    Forced sexual activity: Not on file  Other Topics Concern  . Not on file  Social History Narrative   Married to [employer]  No children 85 years old with fmp.  20 year use of birth control pills.       Family History  Problem Relation Age of  Onset  . Hypertension Mother   . Hypertension Maternal Grandmother   . Pancreatic cancer Paternal Grandmother        diagnosed in her 62s  . Ovarian cancer Maternal Aunt        maternal grandmother's sister  . Breast cancer Paternal Aunt        diagnosed in late 69s early 81s  . Parkinsonism Paternal Grandfather      Current Facility-Administered Medications:  .  0.9 %  sodium chloride infusion, , Intravenous, Continuous, Earlie Server, MD, Last Rate: 150 mL/hr at 12/17/17 1322 .  acetaminophen (TYLENOL) tablet 650 mg, 650 mg, Oral, Q6H PRN **OR** acetaminophen (TYLENOL) suppository 650 mg, 650 mg, Rectal, Q6H PRN, Jodell Cipro, Prasanna, MD .  amLODipine (NORVASC) tablet 5 mg, 5 mg, Oral, Daily, Jodell Cipro, Prasanna, MD, 5 mg at 12/17/17 1342 .  bisacodyl (DULCOLAX) EC tablet 5 mg, 5 mg, Oral, Daily PRN, Arta Silence, MD, 5 mg at 12/16/17 1739 .  diazepam (VALIUM) tablet 10 mg, 10 mg, Oral, QHS PRN, Jodell Cipro, Prasanna, MD .  exemestane (AROMASIN) tablet 25 mg, 25 mg, Oral, QPC breakfast, Jodell Cipro, Prasanna, MD, 25 mg at 12/17/17 1342 .  [COMPLETED] heparin bolus via infusion 4,000 Units, 4,000 Units, Intravenous, Once, 4,000 Units at 12/16/17 1457 **FOLLOWED BY** heparin ADULT infusion 100 units/mL (25000 units/245m sodium chloride 0.45%), 1,100 Units/hr, Intravenous, Continuous, Hallaji, Sheema M, RPH, Last Rate: 11 mL/hr at 12/17/17 1347, 1,100 Units/hr at 12/17/17 1347 .  HYDROmorphone (DILAUDID) injection 0.5-1 mg, 0.5-1 mg, Intravenous, Q3H PRN, SArta Silence MD, 1 mg at 12/17/17 1336 .  Influenza vac  split quadrivalent PF (FLUARIX) injection 0.5 mL, 0.5 mL, Intramuscular, Tomorrow-1000, Konidena, Snehalatha, MD .  ondansetron (ZOFRAN) tablet 4 mg, 4 mg, Oral, Q6H PRN **OR** ondansetron (ZOFRAN) injection 4 mg, 4 mg, Intravenous, Q6H PRN, SJodell Cipro Prasanna, MD, 4 mg at 12/17/17 1148 .  pantoprazole (PROTONIX) EC tablet 40 mg, 40 mg, Oral, Daily, SJodell Cipro Prasanna, MD, 40 mg at 12/17/17 1342 .  scopolamine (TRANSDERM-SCOP) 1 MG/3DAYS 1.5 mg, 1 patch, Transdermal, Q72H, Sridharan, Prasanna, MD, 1.5 mg at 12/16/17 1555 .  senna-docusate (Senokot-S) tablet 1 tablet, 1 tablet, Oral, QHS PRN, SJodell Cipro Prasanna, MD .  traZODone (DESYREL) tablet 25-50 mg, 25-50 mg, Oral, QHS PRN, SArta Silence MD   Physical exam:  Vitals:   12/17/17 1218 12/17/17 1232 12/17/17 1249 12/17/17 1341  BP: 129/84 (!) 171/90 (!) 159/87 (!) 160/91  Pulse: 93 (!) 103 95 92  Resp: (!) 24 15 (!) 24   Temp: 98.3 F (36.8 C)   98.3 F (36.8 C)  TempSrc:    Oral  SpO2: 93% 95% 95% 93%  Weight:      Height:       Physical Exam  Constitutional: She is oriented to person, place, and time. No distress.  HENT:  Head: Normocephalic and atraumatic.  Mouth/Throat: No oropharyngeal exudate.  Eyes: Pupils are equal, round, and reactive to light. EOM are normal.  Neck: Neck supple.  Cardiovascular: Normal rate and regular rhythm.  No murmur heard. Pulmonary/Chest: Effort normal and breath sounds normal. No respiratory distress.  Abdominal: Soft. Bowel sounds are normal.  Musculoskeletal: Normal range of motion. She exhibits no edema.  Neurological: She is alert and oriented to person, place, and time.  Skin: Skin is warm and dry.  Psychiatric: Affect normal.       CMP Latest Ref Rng & Units 12/17/2017  Glucose 70 - 99 mg/dL 106(H)  BUN 6 - 20 mg/dL 19  Creatinine 0.44 - 1.00 mg/dL 1.40(H)  Sodium 135 - 145 mmol/L 139  Potassium 3.5 - 5.1 mmol/L 3.0(L)  Chloride 98 - 111 mmol/L 105  CO2 22 - 32 mmol/L 26   Calcium 8.9 - 10.3 mg/dL 10.8(H)  Total Protein 6.5 - 8.1 g/dL -  Total Bilirubin 0.3 - 1.2 mg/dL -  Alkaline Phos 38 - 126 U/L -  AST 15 - 41 U/L -  ALT 0 - 44 U/L -   CBC Latest Ref Rng & Units 12/17/2017  WBC 3.6 - 11.0 K/uL 7.0  Hemoglobin 12.0 - 16.0 g/dL 11.8(L)  Hematocrit 35.0 - 47.0 % 33.4(L)  Platelets 150 - 440 K/uL 205   RADIOGRAPHIC STUDIES: I have personally reviewed the radiological images as listed and agreed with the findings in the report. Dg Cervical Spine Complete  Result Date: 11/22/2017 CLINICAL DATA:  Neck stiffness for 2 weeks.  Occipital headaches EXAM: CERVICAL SPINE - COMPLETE 4+ VIEW COMPARISON:  None similar FINDINGS: Focal C5-6 disc narrowing with mild endplate ridging and retrolisthesis. No evidence of fracture or bone lesion. No noted facet spurring or erosion. No prevertebral thickening. IMPRESSION: 1. Focal C5-6 disc degeneration. 2. No acute finding. Electronically Signed   By: Monte Fantasia M.D.   On: 11/22/2017 16:02   US Venous Img Lower Unilateral Left  Result Date: 12/16/2017 CLINICAL DATA:  Lower extremity edema EXAM: LEFT LOWER EXTREMITY VENOUS DUPLEX ULTRASOUND TECHNIQUE: Doppler venous assessment of the left lower extremity deep venous system was performed, including characterization of spectral flow, compressibility, and phasicity. COMPARISON:  None. FINDINGS: There is complete compressibility of the left common femoral and femoral veins. The left popliteal vein is partially occlusive and contains partially occlusive DVT. Thrombus is mixed echogenicity with areas of hypoechoic and hyperechoic signal. There is occlusive thrombus in the posterior tibial and peroneal veins. Doppler analysis demonstrates respiratory phasicity and augmentation. IMPRESSION: The study is positive for DVT in the popliteal, posterior tibial, and peroneal veins as described. Critical Value/emergent results were called by telephone at the time of interpretation on 12/16/2017 at  9:24 am to Dr. Alfred Levins, who verbally acknowledged these results. Electronically Signed   By: Marybelle Killings M.D.   On: 12/16/2017 09:25   Ct Renal Stone Study  Result Date: 12/16/2017 CLINICAL DATA:  57 year old female with abdominal pain. History of breast cancer and bilateral mastectomy. Patient is on chemo and radiation treatment. EXAM: CT ABDOMEN AND PELVIS WITHOUT CONTRAST TECHNIQUE: Multidetector CT imaging of the abdomen and pelvis was performed following the standard protocol without IV contrast. COMPARISON:  PET-CT dated 12/01/2011 FINDINGS: Evaluation of this exam is limited in the absence of intravenous contrast. Lower chest: Partially visualized small to moderate bilateral pleural effusions with associated partial compressive atelectasis of the adjacent lungs and areas of linear atelectasis/scarring. No intra-abdominal free air. Diffuse mesenteric edema and stranding and small ascites. Hepatobiliary: Subcentimeter hypodense focus in the right lobe of the liver is not characterized. The liver is otherwise unremarkable on this noncontrast CT. Probable tiny stone or sludge within the gallbladder. Pancreas: Unremarkable. No pancreatic ductal dilatation or surrounding inflammatory changes. Spleen: Normal in size without focal abnormality. Adrenals/Urinary Tract: Mild bilateral adrenal thickening. There is moderate bilateral hydronephrosis. The ureters are nondistended. The hydronephrosis is likely related to a degree of stricture at the ureteropelvic junctions secondary to retroperitoneal adhesions/fibrosis. No stone identified within the kidneys or along the course of the ureters. There is masslike thickening of the anterior and left lateral bladder wall likely metastatic disease.  Stomach/Bowel: There is diffuse thickening of the stomach which may be partly related to underdistention and ascites. Gastritis or an infiltrative process is not entirely excluded. There is no bowel obstruction. The appendix is  normal as visualized. Vascular/Lymphatic: Mild aortoiliac atherosclerotic disease. The abdominal aorta and IVC are grossly unremarkable on this noncontrast CT. No portal venous gas. There are multiple somewhat small retroperitoneal and para-aortic lymph nodes. A mildly enlarged right iliac chain node measures 11 mm in short axis. There is diffuse mesenteric and omental nodularity and edema consistent with metastatic implants. Multiple top-normal and mildly rounded mesenteric lymph nodes measure up to 8 mm in short axis. Reproductive: Evaluation of the pelvic structures is very limited. The uterus appears retroflexed. A 3.3 x 3.9 x 2.6 cm ill-defined high attenuating solid lesion to the right of the uterine fundus is not well evaluated and may represent a fibroid or a complex lesion arising from the right adnexa. However, a neoplastic mass is not excluded. Pelvic ultrasound may provide better evaluation. Other: There is mild diffuse subcutaneous edema of the abdominal wall. Musculoskeletal: Multiple small scattered sclerotic lesions throughout the spine most consistent with metastatic disease. No acute fracture. IMPRESSION: 1. Moderate bilateral hydronephrosis, likely related to strictures at the level of the UPJ's, and likely secondary to retroperitoneal adhesions or fibrosis. No obstructing stone. 2. Masslike thickening of the anterior and left lateral bladder wall suspicious for metastatic implant. 3. Mesenteric, omental, and peritoneal implants with a small ascites and diffuse mesenteric edema. 4. Small to moderate bilateral pleural effusions with associated compressive atelectasis of the lungs. 5. Diffuse small osseous sclerotic metastatic disease. Electronically Signed   By: Anner Crete M.D.   On: 12/16/2017 06:53    Assessment and plan-  Patient is a 57 y.o. female  female with history of Stage III right breast cancer, ER/PR positive and HER2 negative, s/p chemotherapy, B/L mastectomy, adjuvant RT and  on extended Aromasin and Zolodex presents with abdominal pain  # Hypercalcemia, most likely due to metastatic cancer, she already received calcitonin 4 units/kg x 1, about to start Zometa 4 mg x Calcium level has trended down.  Continue monitor calcium level daily. Continue hydration with 192m/hour.  Hold additional calcitonin today.    #Acute renal failure secondary to post renal obstruction at the level of UPJ, possible bladder metastatic implant. S/p cystoscopy, ureteral stent placement and biopsy of suspicious infiltrative bladder mass. Pathology pending.   #Left lower extremity below-knee acute DVT, in the context of widely metastatic malignancy, continue heparin gtt for now.  She can be switched to Eliquis at discharge.   #Onoging Dizziness/lentigo since April 2019.  Patient has had a negative MRI of brain in April 2019.  repeat MRI brain. If negative, consider LP.  Thank you for allowing me to participate in the care of this patient.   ZEarlie Server MD, PhD Hematology Oncology CMemorial Hospitalat ALakeside Milam Recovery CenterPager- [phone]/29/2019

## 2017-12-17 NOTE — Transfer of Care (Signed)
**Note Mary-Identified via Obfuscation** Immediate Anesthesia Transfer of Care Note  Patient: Mary [name]  Procedure(s) Performed: CYSTOSCOPY WITH STENT PLACEMENT (Bilateral )  Patient Location: PACU  Anesthesia Type:General  Level of Consciousness: awake  Airway & Oxygen Therapy: Patient Spontanous Breathing and Patient connected to face mask oxygen  Post-op Assessment: Report given to RN and Post -op Vital signs reviewed and stable  Post vital signs: Reviewed and stable  Last Vitals:  Vitals Value Taken Time  BP    Temp    Pulse    Resp    SpO2      Last Pain:  Vitals:   12/17/17 0852  TempSrc:   PainSc: 3       Patients Stated Pain Goal: 3 (40/69/86 1483)  Complications: No apparent anesthesia complications

## 2017-12-17 NOTE — Anesthesia Procedure Notes (Signed)
Procedure Name: LMA Insertion Date/Time: 12/17/2017 11:33 AM Performed by: Chanetta Marshall, CRNA Pre-anesthesia Checklist: Patient identified, Emergency Drugs available, Suction available and Patient being monitored Patient Re-evaluated:Patient Re-evaluated prior to induction Preoxygenation: Pre-oxygenation with 100% oxygen Induction Type: IV induction Ventilation: Mask ventilation without difficulty LMA: LMA inserted LMA Size: 4.0 Number of attempts: 1 Placement Confirmation: positive ETCO2,  CO2 detector and breath sounds checked- equal and bilateral Dental Injury: Teeth and Oropharynx as per pre-operative assessment

## 2017-12-17 NOTE — Progress Notes (Signed)
15 minute call to floor. 

## 2017-12-18 ENCOUNTER — Encounter: Payer: Self-pay | Admitting: Urology

## 2017-12-18 ENCOUNTER — Inpatient Hospital Stay: Payer: 59

## 2017-12-18 LAB — CBC
HEMATOCRIT: 33.8 % — AB (ref 35.0–47.0)
Hemoglobin: 11.7 g/dL — ABNORMAL LOW (ref 12.0–16.0)
MCH: 32.3 pg (ref 26.0–34.0)
MCHC: 34.7 g/dL (ref 32.0–36.0)
MCV: 93.2 fL (ref 80.0–100.0)
Platelets: 230 10*3/uL (ref 150–440)
RBC: 3.62 MIL/uL — ABNORMAL LOW (ref 3.80–5.20)
RDW: 12.4 % (ref 11.5–14.5)
WBC: 10.3 10*3/uL (ref 3.6–11.0)

## 2017-12-18 LAB — BASIC METABOLIC PANEL
ANION GAP: 9 (ref 5–15)
BUN: 22 mg/dL — ABNORMAL HIGH (ref 6–20)
CO2: 23 mmol/L (ref 22–32)
Calcium: 9.6 mg/dL (ref 8.9–10.3)
Chloride: 109 mmol/L (ref 98–111)
Creatinine, Ser: 2.31 mg/dL — ABNORMAL HIGH (ref 0.44–1.00)
GFR calc Af Amer: 26 mL/min — ABNORMAL LOW (ref >60)
GFR calc non Af Amer: 22 mL/min — ABNORMAL LOW (ref >60)
GLUCOSE: 113 mg/dL — AB (ref 70–99)
Potassium: 3.3 mmol/L — ABNORMAL LOW (ref 3.5–5.1)
Sodium: 141 mmol/L (ref 135–145)

## 2017-12-18 LAB — CALCIUM, IONIZED: CALCIUM, IONIZED, SERUM: 8 mg/dL — AB (ref 4.5–5.6)

## 2017-12-18 LAB — HEPARIN LEVEL (UNFRACTIONATED): HEPARIN UNFRACTIONATED: 0.49 [IU]/mL (ref 0.30–0.70)

## 2017-12-18 NOTE — Progress Notes (Signed)
Foley catheter placed per MD order, pt tolerated well, no urine return noted. Will continue to monitor.

## 2017-12-18 NOTE — Progress Notes (Signed)
Urology Consult Follow Up  Subjective: Patient with stent discomfort and constipation this morning.  Creatinine is 2.31 this morning.  Patient is passing urine with good urine output.    Anti-infectives: Anti-infectives (From admission, onward)   Start     Dose/Rate Route Frequency Ordered Stop   12/17/17 0600  ceFAZolin (ANCEF) IVPB 2g/100 mL premix     2 g 200 mL/hr over 30 Minutes Intravenous  Once 12/16/17 1516 12/17/17 0605      Current Facility-Administered Medications  Medication Dose Route Frequency Provider Last Rate Last Dose  . 0.9 %  sodium chloride infusion   Intravenous Continuous Earlie Server, MD 150 mL/hr at 12/18/17 0428    . acetaminophen (TYLENOL) tablet 650 mg  650 mg Oral Q6H PRN Arta Silence, MD       Or  . acetaminophen (TYLENOL) suppository 650 mg  650 mg Rectal Q6H PRN Arta Silence, MD      . amLODipine (NORVASC) tablet 5 mg  5 mg Oral Daily Arta Silence, MD   5 mg at 12/17/17 1342  . bisacodyl (DULCOLAX) EC tablet 5 mg  5 mg Oral Daily PRN Arta Silence, MD   5 mg at 12/16/17 1739  . diazepam (VALIUM) tablet 10 mg  10 mg Oral QHS PRN Arta Silence, MD      . exemestane (AROMASIN) tablet 25 mg  25 mg Oral QPC breakfast Arta Silence, MD   25 mg at 12/17/17 1342  . heparin ADULT infusion 100 units/mL (25000 units/231mL sodium chloride 0.45%)  1,100 Units/hr Intravenous Continuous Hallaji, Sheema M, RPH 11 mL/hr at 12/18/17 0200 1,100 Units/hr at 12/18/17 0200  . HYDROmorphone (DILAUDID) injection 0.5-1 mg  0.5-1 mg Intravenous Q3H PRN Arta Silence, MD   1 mg at 12/18/17 0645  . Influenza vac split quadrivalent PF (FLUARIX) injection 0.5 mL  0.5 mL Intramuscular Tomorrow-1000 Epifanio Lesches, MD      . ondansetron (ZOFRAN) tablet 4 mg  4 mg Oral Q6H PRN Arta Silence, MD       Or  . ondansetron (ZOFRAN) injection 4 mg  4 mg Intravenous Q6H PRN Arta Silence, MD   4 mg at 12/17/17 1148  . opium-belladonna  (B&O SUPPRETTES) 16.2-60 MG suppository 1 suppository  1 suppository Rectal Q8H PRN Hollice Espy, MD      . oxybutynin (DITROPAN) tablet 5 mg  5 mg Oral Q8H PRN Hollice Espy, MD   5 mg at 12/18/17 0645  . pantoprazole (PROTONIX) EC tablet 40 mg  40 mg Oral Daily Arta Silence, MD   40 mg at 12/17/17 1342  . scopolamine (TRANSDERM-SCOP) 1 MG/3DAYS 1.5 mg  1 patch Transdermal Q72H Arta Silence, MD   1.5 mg at 12/16/17 1555  . senna-docusate (Senokot-S) tablet 1 tablet  1 tablet Oral QHS PRN Arta Silence, MD      . traZODone (DESYREL) tablet 25-50 mg  25-50 mg Oral QHS PRN Arta Silence, MD         Objective: Vital signs in last 24 hours: Temp:  [97.9 F (36.6 C)-98.6 F (37 C)] 97.9 F (36.6 C) (09/30 0752) Pulse Rate:  [92-104] 95 (09/30 0752) Resp:  [15-24] 18 (09/30 0752) BP: (129-171)/(80-91) 142/81 (09/30 0752) SpO2:  [90 %-95 %] 90 % (09/30 0752)  Intake/Output from previous day: 09/29 0701 - 09/30 0700 In: 2213.7 [I.V.:2213.7] Out: 1552 [Urine:1550; Blood:2] Intake/Output this shift: No intake/output data recorded.   Physical Exam Constitutional: Well nourished. Alert and oriented, No acute distress. HEENT: [place] AT, moist  mucus membranes. Trachea midline, no masses. Cardiovascular: No clubbing, cyanosis, or edema. Respiratory: Normal respiratory effort, no increased work of breathing. GI: Abdomen is soft, non tender, non distended, no abdominal masses. Liver and spleen not palpable.  No hernias appreciated.  Stool sample for occult testing is not indicated.   GU: No CVA tenderness.  No bladder fullness or masses.  Mild suprapubic tenderness  Skin: No rashes, bruises or suspicious lesions. Lymph: No cervical or inguinal adenopathy. Neurologic: Grossly intact, no focal deficits, moving all 4 extremities. Psychiatric: Normal mood and affect.  Lab Results:  Recent Labs    12/17/17 0458 12/18/17 0649  WBC 7.0 10.3  HGB 11.8* 11.7*  HCT  33.4* 33.8*  PLT 205 230   BMET Recent Labs    12/17/17 0458 12/18/17 0649  NA 139 141  K 3.0* 3.3*  CL 105 109  CO2 26 23  GLUCOSE 106* 113*  BUN 19 22*  CREATININE 1.40* 2.31*  CALCIUM 10.8* 9.6   PT/INR Recent Labs    12/16/17 1343  LABPROT 13.2  INR 1.01   ABG No results for input(s): PHART, HCO3 in the last 72 hours.  Invalid input(s): PCO2, PO2  Studies/Results: US Venous Img Lower Unilateral Left  Result Date: 12/16/2017 CLINICAL DATA:  Lower extremity edema EXAM: LEFT LOWER EXTREMITY VENOUS DUPLEX ULTRASOUND TECHNIQUE: Doppler venous assessment of the left lower extremity deep venous system was performed, including characterization of spectral flow, compressibility, and phasicity. COMPARISON:  None. FINDINGS: There is complete compressibility of the left common femoral and femoral veins. The left popliteal vein is partially occlusive and contains partially occlusive DVT. Thrombus is mixed echogenicity with areas of hypoechoic and hyperechoic signal. There is occlusive thrombus in the posterior tibial and peroneal veins. Doppler analysis demonstrates respiratory phasicity and augmentation. IMPRESSION: The study is positive for DVT in the popliteal, posterior tibial, and peroneal veins as described. Critical Value/emergent results were called by telephone at the time of interpretation on 12/16/2017 at 9:24 am to Dr. Alfred Levins, who verbally acknowledged these results. Electronically Signed   By: Marybelle Killings M.D.   On: 12/16/2017 09:25     Assessment and Plan: Patient with stage III breast cancer that is now diffusely metastasized involving retroperitoneum, omentum, bone, possibly liver and possible bladder with bilateral ureteral obstruction.  1.  Bilateral hydroureteronephrosis S/p bilateral stent placement with bladder biopsies on 12/17/2017  Continue to trend creatinine  Bladder biopsies pending    LOS: 2 days    [facility] 12/18/2017

## 2017-12-18 NOTE — Progress Notes (Signed)
**Note Mary-Identified via Obfuscation** Patient ID: Mary [name], female   DOB: Aug 08, 1960, 57 y.o.   MRN: [id number]  [employer] PROGRESS NOTE  Mary MARCIEL [name]:[id number] DOB: Dec 06, 1960 DOA: 12/16/2017 PCP: [name], MD  HPI/Subjective: Patient feeling a little bit better.  States she is urinating very little amounts.  Bladder scan only showed 88 cc in it.  Objective: Vitals:   12/18/17 0752 12/18/17 0838  BP: (!) 142/81   Pulse: 95   Resp: 18   Temp: 97.9 F (36.6 C)   SpO2: 90% (!) 89%    Filed Weights   12/16/17 0318 12/16/17 1225  Weight: 72.6 kg 72.5 kg    ROS: Review of Systems  Constitutional: Negative for chills and fever.  Eyes: Negative for blurred vision.  Respiratory: Negative for cough and shortness of breath.   Cardiovascular: Negative for chest pain.  Gastrointestinal: Negative for abdominal pain, constipation, diarrhea, nausea and vomiting.  Genitourinary: Negative for dysuria.  Musculoskeletal: Negative for joint pain.  Neurological: Positive for dizziness. Negative for headaches.   Exam: Physical Exam  Constitutional: She is oriented to person, place, and time.  HENT:  Nose: No mucosal edema.  Mouth/Throat: No oropharyngeal exudate or posterior oropharyngeal edema.  Eyes: Pupils are equal, round, and reactive to light. Conjunctivae, EOM and lids are normal.  Neck: No JVD present. Carotid bruit is not present. No edema present. No thyroid mass and no thyromegaly present.  Cardiovascular: S1 normal and S2 normal. Exam reveals no gallop.  No murmur heard. Pulses:      Dorsalis pedis pulses are 2+ on the right side, and 2+ on the left side.  Respiratory: No respiratory distress. She has no wheezes. She has no rhonchi. She has no rales.  GI: Soft. Bowel sounds are normal. There is no tenderness.  Musculoskeletal:       Right ankle: She exhibits no swelling.       Left ankle: She exhibits no swelling.  Lymphadenopathy:    She has no cervical adenopathy.   Neurological: She is alert and oriented to person, place, and time. No cranial nerve deficit.  Skin: Skin is warm. No rash noted. Nails show no clubbing.  Psychiatric: She has a normal mood and affect.      Data Reviewed: Basic Metabolic Panel: Recent Labs  Lab 12/15/17 1525 12/16/17 0332 12/16/17 1438 12/17/17 0458 12/18/17 0649  NA 138 139  --  139 141  K 3.2* 3.0*  --  3.0* 3.3*  CL 98 102  --  105 109  CO2 30 28  --  26 23  GLUCOSE 122* 113*  --  106* 113*  BUN 30* 30*  --  19 22*  CREATININE 1.58* 1.63*  --  1.40* 2.31*  CALCIUM 13.1* 13.9* 12.2* 10.8* 9.6  MG  --  2.1  --   --   --   PHOS  --  3.1  --   --   --    Liver Function Tests: Recent Labs  Lab 12/15/17 1525 12/16/17 0332  AST 36* 39  ALT 37* 43  ALKPHOS  --  110  BILITOT 0.3 0.6  PROT 6.4 6.9  ALBUMIN  --  3.8   CBC: Recent Labs  Lab 12/16/17 0332 12/17/17 0458 12/18/17 0649  WBC 7.6 7.0 10.3  HGB 13.0 11.8* 11.7*  HCT 37.5 33.4* 33.8*  MCV 91.3 91.5 93.2  PLT 201 205 230     Studies: No results found.  Scheduled Meds: . amLODipine  5  mg Oral Daily  . exemestane  25 mg Oral QPC breakfast  . Influenza vac split quadrivalent PF  0.5 mL Intramuscular Tomorrow-1000  . pantoprazole  40 mg Oral Daily  . scopolamine  1 patch Transdermal Q72H   Continuous Infusions: . sodium chloride 75 mL/hr at 12/18/17 1254  . heparin 1,100 Units/hr (12/18/17 1254)    Assessment/Plan:  1. Acute kidney injury.  Creatinine bumped up after procedure yesterday.  Continue IV fluid hydration.  Serial kidney function monitoring.   2. Severe hypercalcemia.  Calcium improved to normal range.  IV fluid hydration.  Patient received a dose of Zometa.  Hold hydrochlorothiazide 3. Metastatic breast cancer.  Follow-up with oncology. 4. Acute DVT left lower extremity.  Restarted heparin drip.  Will convert over to Eliquis once creatinine improves 5. Dizziness likely inner ear issue.  Tympanic membrane normal  bilaterally.  Oncology ordered an MRI of the brain with contrast but I had to cancel this because of the acute kidney injury. 6. Weakness.  Physical therapy evaluation  Code Status:     Code Status Orders  (From admission, onward)         Start     Ordered   12/16/17 1248  Full code  Continuous     12/16/17 1247        Code Status History    Date Active Date Inactive Code Status Order ID Comments User Context   05/18/2012 1813 05/20/2012 1306 Full Code [id number]  Adin Hector, MD Inpatient    Advance Directive Documentation     Most Recent Value  Type of Advance Directive  Living will  Pre-existing out of facility DNR order (yellow form or pink MOST form)  -  "MOST" Form in Place?  -     Family Communication: Family at bedside Disposition Plan: To be determined  Consultants:  Neurology  Oncology  Time spent: 27 minutes  [place]

## 2017-12-18 NOTE — Progress Notes (Signed)
Hematology/Oncology Progress Note [facility] [phone] [phone]  Patient Care Team: [name], MD as PCP - General (Internal Medicine) Serena Colonel, RN as [place] [facility]   Name of the patient: Mary [name]  [id number]  Apr 05, 1960  Date of visit: 12/18/17   INTERVAL HISTORY-  No acute overnight events.  Could not get MRI brain done as creatinine rises.  Feels urgency to void however does not pass much urine. A bladder scan was done earlier and only have 80cc in bladder.   Review of Systems  Constitutional: Positive for malaise/fatigue.  HENT: Negative for sore throat.   Eyes: Negative for blurred vision.  Cardiovascular: Negative for chest pain.  Gastrointestinal: Negative for nausea and vomiting.  Genitourinary: Positive for urgency.  Skin: Negative for rash.  Neurological: Positive for dizziness.  Psychiatric/Behavioral: Negative for hallucinations.     Allergies  Allergen Reactions  . Adhesive [Tape] Other (See Comments)    Redness:Please use paper tape    Patient Active Problem List   Diagnosis Date Noted  . Hypercalcemia 12/16/2017  . Malignancy ([place])   . Acute deep vein thrombosis (DVT) of distal vein of left lower extremity ([place])   . Dizziness   . Bilateral posterior neck pain 11/24/2017  . Elevated liver enzymes 11/24/2017  . Tubular adenoma of colon 10/11/2017  . 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 Generalized anxiety disorder 09/19/2017  . Vertigo, labyrinthine, bilateral 08/27/2017  . Prediabetes 08/27/2017  . Encounter for screening colonoscopy 09/21/2014  . Other malaise and fatigue 12/14/2013  . Hot flashes, menopausal 08/08/2012  . Neutropenia, drug-induced ([place]) 12/15/2011  . Primary cancer of upper outer quadrant of left female breast ([place]) 11/17/2011  . Hypertension 09/23/2011  . PLANTAR FASCIITIS, LEFT 12/01/2009  . UNEQUAL LEG LENGTH  12/01/2009     Past Medical History:  Diagnosis Date  . Anxiety    takes Xanax prn  . Breast cancer [facility]) August 2013   bilateral, er/pr+  . Complication of anesthesia    pt states she had the shakes extremely bad  . Cough    at night d/t reflux;nothing productive  . Dental crowns present    x 4  . GERD (gastroesophageal reflux disease)    takes Nexium daily  . History of bronchitis    08/2011  . History of colon polyps   . Hot flashes, menopausal 08/08/2012  . IBS (irritable bowel syndrome)    immodium prn  . Insomnia    takes Ambien nightly  . Irritable bowel syndrome   . Neutropenia, drug-induced ([place]) 12/15/2011  . Nocturia   . Plantar fasciitis   . S/P radiation therapy 07/03/2012 through 08/18/2038                                                        Right chest wall/regional lymph nodes 5040 cGy 28 sessions, right chest wall/mastectomy scar boost 1000 cGy 5 sessions                          . Status post chemotherapy    FEC 100 starting on 12/08/2011 - 01/18/12/single agent Taxol x12 weeks from 02/02/2012 through January 2014   . Urinary frequency   . Use of tamoxifen (Nolvadex)   .  Vertigo      Past Surgical History:  Procedure Laterality Date  . BREAST LUMPECTOMY  2008   left  . COLONOSCOPY    . COLONOSCOPY WITH PROPOFOL N/A 10/10/2017   Procedure: COLONOSCOPY WITH PROPOFOL;  Surgeon: Lucilla Lame, MD;  Location: [facility] ENDOSCOPY;  Service: Endoscopy;  Laterality: N/A;  . CYSTOSCOPY WITH STENT PLACEMENT Bilateral 12/17/2017   Procedure: CYSTOSCOPY WITH STENT PLACEMENT;  Surgeon: Hollice Espy, MD;  Location: [facility] ORS;  Service: Urology;  Laterality: Bilateral;  . MASTECTOMY    . MASTECTOMY WITH AXILLARY LYMPH NODE DISSECTION Right 05/18/2012   Procedure: MASTECTOMY WITH AXILLARY LYMPH NODE DISSECTION;  Surgeon: Adin Hector, MD;  Location: [place];  Service: General;  Laterality: Right;  Bilateral Total Mastectomy , right axillary lymph node dissection left  axillary sentinel node biopsy, removal of Porta Cath  . PORT-A-CATH REMOVAL N/A 05/18/2012   Procedure: REMOVAL PORT-A-CATH;  Surgeon: Adin Hector, MD;  Location: [place];  Service: General;  Laterality: N/A;  . PORTACATH PLACEMENT  12/02/2011   Procedure: INSERTION PORT-A-CATH;  Surgeon: Adin Hector, MD;  Location: [place];  Service: General;  Laterality: N/A;  insertion port a cath with fluoroscopy  . SIMPLE MASTECTOMY WITH AXILLARY SENTINEL NODE BIOPSY Left 05/18/2012   Procedure: SIMPLE MASTECTOMY WITH AXILLARY SENTINEL NODE BIOPSY;  Surgeon: Adin Hector, MD;  Location: [place];  Service: General;  Laterality: Left;    Social History   Socioeconomic History  . Marital status: Married    Spouse name: Not on file  . Number of children: Not on file  . Years of education: Not on file  . Highest education level: Not on file  Occupational History  . Occupation: Network engineer II    Employer: [employer]  Social Needs  . Financial resource strain: Not on file  . Food insecurity:    Worry: Not on file    Inability: Not on file  . Transportation needs:    Medical: Not on file    Non-medical: Not on file  Tobacco Use  . Smoking status: Former Smoker    Packs/day: 0.50    Last attempt to quit: 03/21/1997    Years since quitting: 20.7  . Smokeless tobacco: Never Used  Substance and Sexual Activity  . Alcohol use: Yes    Alcohol/week: 0.0 standard drinks    Comment: occasionally wine  . Drug use: No  . Sexual activity: Yes    Birth control/protection: None  Lifestyle  . Physical activity:    Days per week: Not on file    Minutes per session: Not on file  . Stress: Not on file  Relationships  . Social connections:    Talks on phone: Not on file    Gets together: Not on file    Attends religious service: Not on file    Active member of club or organization: Not on file    Attends meetings of clubs or organizations: Not on file    Relationship status: Not  on file  . Intimate partner violence:    Fear of current or ex partner: Not on file    Emotionally abused: Not on file    Physically abused: Not on file    Forced sexual activity: Not on file  Other Topics Concern  . Not on file  Social History Narrative   Married to [employer]  No children 25 years old with fmp.  20 year use of birth control pills.  Family History  Problem Relation Age of Onset  . Hypertension Mother   . Hypertension Maternal Grandmother   . Pancreatic cancer Paternal Grandmother        diagnosed in her 95s  . Ovarian cancer Maternal Aunt        maternal grandmother's sister  . Breast cancer Paternal Aunt        diagnosed in late 65s early 3s  . Parkinsonism Paternal Grandfather      Current Facility-Administered Medications:  .  0.9 %  sodium chloride infusion, , Intravenous, Continuous, Wieting, Richard, MD, Last Rate: 75 mL/hr at 12/18/17 1254 .  acetaminophen (TYLENOL) tablet 650 mg, 650 mg, Oral, Q6H PRN **OR** acetaminophen (TYLENOL) suppository 650 mg, 650 mg, Rectal, Q6H PRN, Jodell Cipro, Prasanna, MD .  amLODipine (NORVASC) tablet 5 mg, 5 mg, Oral, Daily, Jodell Cipro, Prasanna, MD, 5 mg at 12/18/17 0843 .  bisacodyl (DULCOLAX) EC tablet 5 mg, 5 mg, Oral, Daily PRN, Arta Silence, MD, 5 mg at 12/18/17 1858 .  diazepam (VALIUM) tablet 10 mg, 10 mg, Oral, QHS PRN, Jodell Cipro, Prasanna, MD .  exemestane (AROMASIN) tablet 25 mg, 25 mg, Oral, QPC breakfast, Jodell Cipro, Prasanna, MD, 25 mg at 12/18/17 0843 .  [COMPLETED] heparin bolus via infusion 4,000 Units, 4,000 Units, Intravenous, Once, 4,000 Units at 12/16/17 1457 **FOLLOWED BY** heparin ADULT infusion 100 units/mL (25000 units/261m sodium chloride 0.45%), 1,100 Units/hr, Intravenous, Continuous, Hallaji, Sheema M, RPH, Last Rate: 11 mL/hr at 12/18/17 1254, 1,100 Units/hr at 12/18/17 1254 .  HYDROmorphone (DILAUDID) injection 0.5-1 mg, 0.5-1 mg, Intravenous, Q3H PRN, SArta Silence MD, 1 mg  at 12/18/17 2005 .  Influenza vac split quadrivalent PF (FLUARIX) injection 0.5 mL, 0.5 mL, Intramuscular, Tomorrow-1000, Konidena, Snehalatha, MD .  ondansetron (ZOFRAN) tablet 4 mg, 4 mg, Oral, Q6H PRN **OR** ondansetron (ZOFRAN) injection 4 mg, 4 mg, Intravenous, Q6H PRN, SJodell Cipro Prasanna, MD, 4 mg at 12/17/17 1148 .  opium-belladonna (B&O SUPPRETTES) 16.2-60 MG suppository 1 suppository, 1 suppository, Rectal, Q8H PRN, BHollice Espy MD .  oxybutynin ([facility] tablet 5 mg, 5 mg, Oral, Q8H PRN, BHollice Espy MD, 5 mg at 12/18/17 2134 .  pantoprazole (PROTONIX) EC tablet 40 mg, 40 mg, Oral, Daily, SJodell Cipro Prasanna, MD, 40 mg at 12/18/17 0843 .  scopolamine (TRANSDERM-SCOP) 1 MG/3DAYS 1.5 mg, 1 patch, Transdermal, Q72H, Sridharan, Prasanna, MD, 1.5 mg at 12/16/17 1555 .  senna-docusate (Senokot-S) tablet 1 tablet, 1 tablet, Oral, QHS PRN, SJodell Cipro Prasanna, MD .  traZODone (DESYREL) tablet 25-50 mg, 25-50 mg, Oral, QHS PRN, SArta Silence MD   Physical exam:  Vitals:   12/18/17 0838 12/18/17 1622 12/18/17 1659 12/18/17 1945  BP:  (!) 152/80 (!) 148/78 (!) 143/80  Pulse:  97 92 100  Resp:   19   Temp:  98 F (36.7 C) 98.5 F (36.9 C) 98.5 F (36.9 C)  TempSrc:  Oral Oral Oral  SpO2: (!) 89% 95% 94% 95%  Weight:      Height:      Physical Exam  Constitutional: She is oriented to person, place, and time. No distress.  HENT:  Head: Normocephalic and atraumatic.  Eyes: Pupils are equal, round, and reactive to light. EOM are normal.  Neck: Neck supple.  Cardiovascular: Normal rate.  No murmur heard. Pulmonary/Chest: Effort normal. No respiratory distress.  Abdominal: Soft. She exhibits no distension.  Musculoskeletal: Normal range of motion.  Neurological: She is oriented to person, place, and time. No cranial nerve deficit.  Skin: Skin is warm and dry.  Psychiatric: She has a normal mood and affect.       CMP Latest Ref Rng & Units 12/18/2017  Glucose 70 -  99 mg/dL 113(H)  BUN 6 - 20 mg/dL 22(H)  Creatinine 0.44 - 1.00 mg/dL 2.31(H)  Sodium 135 - 145 mmol/L 141  Potassium 3.5 - 5.1 mmol/L 3.3(L)  Chloride 98 - 111 mmol/L 109  CO2 22 - 32 mmol/L 23  Calcium 8.9 - 10.3 mg/dL 9.6  Total Protein 6.5 - 8.1 g/dL -  Total Bilirubin 0.3 - 1.2 mg/dL -  Alkaline Phos 38 - 126 U/L -  AST 15 - 41 U/L -  ALT 0 - 44 U/L -   CBC Latest Ref Rng & Units 12/18/2017  WBC 3.6 - 11.0 K/uL 10.3  Hemoglobin 12.0 - 16.0 g/dL 11.7(L)  Hematocrit 35.0 - 47.0 % 33.8(L)  Platelets 150 - 440 K/uL 230   RADIOGRAPHIC STUDIES: I have personally reviewed the radiological images as listed and agreed with the findings in the report. Dg Cervical Spine Complete  Result Date: 11/22/2017 CLINICAL DATA:  Neck stiffness for 2 weeks.  Occipital headaches EXAM: CERVICAL SPINE - COMPLETE 4+ VIEW COMPARISON:  None similar FINDINGS: Focal C5-6 disc narrowing with mild endplate ridging and retrolisthesis. No evidence of fracture or bone lesion. No noted facet spurring or erosion. No prevertebral thickening. IMPRESSION: 1. Focal C5-6 disc degeneration. 2. No acute finding. Electronically Signed   By: Monte Fantasia M.D.   On: 11/22/2017 16:02   US Renal  Result Date: 12/18/2017 CLINICAL DATA:  Hydronephrosis aunt LOWER abdominal pain for 2-3 months. History of cystoscopy with bilateral stent placement 12/17/2017. EXAM: RENAL / URINARY TRACT ULTRASOUND COMPLETE COMPARISON:  CT of the abdomen and pelvis on 12/16/2017 FINDINGS: Right Kidney: Length: 12.1 centimeters. There is moderate hydronephrosis. Stent is visualized within the RIGHT renal pelvis. No solid or cystic renal mass. Left Kidney: Length: 12.7 centimeter. There is moderate hydronephrosis. The ureteral stent is not well seen in the LEFT renal pelvis. Bladder: Bladder is decompressed by a Foley catheter. Additional: There are bilateral pleural effusions.  Ascites. IMPRESSION: 1. Moderate bilateral hydronephrosis. 2. Foley catheter  decompresses the bladder. 3. Bilateral pleural effusions.  Ascites. Electronically Signed   By: Nolon Nations M.D.   On: 12/18/2017 16:36   US Venous Img Lower Unilateral Left  Result Date: 12/16/2017 CLINICAL DATA:  Lower extremity edema EXAM: LEFT LOWER EXTREMITY VENOUS DUPLEX ULTRASOUND TECHNIQUE: Doppler venous assessment of the left lower extremity deep venous system was performed, including characterization of spectral flow, compressibility, and phasicity. COMPARISON:  None. FINDINGS: There is complete compressibility of the left common femoral and femoral veins. The left popliteal vein is partially occlusive and contains partially occlusive DVT. Thrombus is mixed echogenicity with areas of hypoechoic and hyperechoic signal. There is occlusive thrombus in the posterior tibial and peroneal veins. Doppler analysis demonstrates respiratory phasicity and augmentation. IMPRESSION: The study is positive for DVT in the popliteal, posterior tibial, and peroneal veins as described. Critical Value/emergent results were called by telephone at the time of interpretation on 12/16/2017 at 9:24 am to Dr. Alfred Levins, who verbally acknowledged these results. Electronically Signed   By: Marybelle Killings M.D.   On: 12/16/2017 09:25   Ct Renal Stone Study  Result Date: 12/16/2017 CLINICAL DATA:  58 year old female with abdominal pain. History of breast cancer and bilateral mastectomy. Patient is on chemo and radiation treatment. EXAM: CT ABDOMEN AND PELVIS WITHOUT CONTRAST TECHNIQUE: Multidetector CT imaging of the abdomen and pelvis  was performed following the standard protocol without IV contrast. COMPARISON:  PET-CT dated 12/01/2011 FINDINGS: Evaluation of this exam is limited in the absence of intravenous contrast. Lower chest: Partially visualized small to moderate bilateral pleural effusions with associated partial compressive atelectasis of the adjacent lungs and areas of linear atelectasis/scarring. No intra-abdominal  free air. Diffuse mesenteric edema and stranding and small ascites. Hepatobiliary: Subcentimeter hypodense focus in the right lobe of the liver is not characterized. The liver is otherwise unremarkable on this noncontrast CT. Probable tiny stone or sludge within the gallbladder. Pancreas: Unremarkable. No pancreatic ductal dilatation or surrounding inflammatory changes. Spleen: Normal in size without focal abnormality. Adrenals/Urinary Tract: Mild bilateral adrenal thickening. There is moderate bilateral hydronephrosis. The ureters are nondistended. The hydronephrosis is likely related to a degree of stricture at the ureteropelvic junctions secondary to retroperitoneal adhesions/fibrosis. No stone identified within the kidneys or along the course of the ureters. There is masslike thickening of the anterior and left lateral bladder wall likely metastatic disease. Stomach/Bowel: There is diffuse thickening of the stomach which may be partly related to underdistention and ascites. Gastritis or an infiltrative process is not entirely excluded. There is no bowel obstruction. The appendix is normal as visualized. Vascular/Lymphatic: Mild aortoiliac atherosclerotic disease. The abdominal aorta and IVC are grossly unremarkable on this noncontrast CT. No portal venous gas. There are multiple somewhat small retroperitoneal and para-aortic lymph nodes. A mildly enlarged right iliac chain node measures 11 mm in short axis. There is diffuse mesenteric and omental nodularity and edema consistent with metastatic implants. Multiple top-normal and mildly rounded mesenteric lymph nodes measure up to 8 mm in short axis. Reproductive: Evaluation of the pelvic structures is very limited. The uterus appears retroflexed. A 3.3 x 3.9 x 2.6 cm ill-defined high attenuating solid lesion to the right of the uterine fundus is not well evaluated and may represent a fibroid or a complex lesion arising from the right adnexa. However, a neoplastic  mass is not excluded. Pelvic ultrasound may provide better evaluation. Other: There is mild diffuse subcutaneous edema of the abdominal wall. Musculoskeletal: Multiple small scattered sclerotic lesions throughout the spine most consistent with metastatic disease. No acute fracture. IMPRESSION: 1. Moderate bilateral hydronephrosis, likely related to strictures at the level of the UPJ's, and likely secondary to retroperitoneal adhesions or fibrosis. No obstructing stone. 2. Masslike thickening of the anterior and left lateral bladder wall suspicious for metastatic implant. 3. Mesenteric, omental, and peritoneal implants with a small ascites and diffuse mesenteric edema. 4. Small to moderate bilateral pleural effusions with associated compressive atelectasis of the lungs. 5. Diffuse small osseous sclerotic metastatic disease. Electronically Signed   By: Anner Crete M.D.   On: 12/16/2017 06:53    Assessment and plan-  Patient is a 57 y.o. female  female with history of Stage III right breast cancer, ER/PR positive and HER2 negative, s/p chemotherapy, B/L mastectomy, adjuvant RT and on extended Aromasin and Zolodex presents with abdominal pain  # Hypercalcemia, likely due to metastatic cancer, she already received calcitonin 4 units/kg x 1, about to start Zometa 4 mg x 1 Calcium level has further trended down.  Continue gentle hydration.   #Acute renal failure secondary to post renal obstruction at the level of UPJ, possible bladder metastatic implant. S/p cystoscopy, ureteral stent placement and biopsy of suspicious infiltrative bladder mass. Pathology pending.  Creatinine rises today.  Discussed with Urology Dr.Brandon and she advised inserting foley and monitor urine output.  If kidney function continues to  deteriorate, patient may need percutaneous tubes placed.   #Left lower extremity below-knee acute DVT, in the context of widely metastatic malignancy, continue heparin gtt for now as she may  need additional procedures. She can be switched to Eliquis at discharge.   #Onoging Dizziness/lentigo since April 2019.  Patient has had a negative MRI of brain in April 2019.  repeat MRI brain. If negative, consider LP.  Thank you for allowing me to participate in the care of this patient.   Earlie Server, MD, PhD Hematology Oncology [facility] at [facility] Pager- [phone] 12/18/2017

## 2017-12-18 NOTE — Progress Notes (Signed)
Patient ambulated around nurses station x1 and tolerated well. Will continue to monitor.

## 2017-12-18 NOTE — Progress Notes (Signed)
ANTICOAGULATION CONSULT NOTE - Initial Consult  Pharmacy Consult for Heparin  Indication: chest pain/ACS  Allergies  Allergen Reactions  . Adhesive [Tape] Other (See Comments)    Redness:Please use paper tape    Patient Measurements: Height: 5\' 4"  (162.6 cm) Weight: 159 lb 12.8 oz (72.5 kg) IBW/kg (Calculated) : 54.7 Heparin Dosing Weight:   Vital Signs: Temp: 98.5 F (36.9 C) (09/30 0420) Temp Source: Oral (09/30 0420) BP: 154/83 (09/30 0420) Pulse Rate: 94 (09/30 0420)  Labs: Recent Labs    12/16/17 0332 12/16/17 1343 12/16/17 2047 12/17/17 0458 12/18/17 0649  HGB 13.0  --   --  11.8* 11.7*  HCT 37.5  --   --  33.4* 33.8*  PLT 201  --   --  205 230  APTT  --  27  --   --   --   LABPROT  --  13.2  --   --   --   INR  --  1.01  --   --   --   HEPARINUNFRC  --   --  0.46 0.42 0.49  CREATININE 1.63*  --   --  1.40* 2.31*    Estimated Creatinine Clearance: 26.5 mL/min (A) (by C-G formula based on SCr of 2.31 mg/dL (H)).   Medical History: Past Medical History:  Diagnosis Date  . Anxiety    takes Xanax prn  . Breast cancer [facility]) August 2013   bilateral, er/pr+  . Complication of anesthesia    pt states she had the shakes extremely bad  . Cough    at night d/t reflux;nothing productive  . Dental crowns present    x 4  . GERD (gastroesophageal reflux disease)    takes Nexium daily  . History of bronchitis    08/2011  . History of colon polyps   . Hot flashes, menopausal 08/08/2012  . IBS (irritable bowel syndrome)    immodium prn  . Insomnia    takes Ambien nightly  . Irritable bowel syndrome   . Neutropenia, drug-induced ([place]) 12/15/2011  . Nocturia   . Plantar fasciitis   . S/P radiation therapy 07/03/2012 through 08/18/2038                                                        Right chest wall/regional lymph nodes 5040 cGy 28 sessions, right chest wall/mastectomy scar boost 1000 cGy 5 sessions                          . Status post chemotherapy    FEC 100 starting on 12/08/2011 - 01/18/12/single agent Taxol x12 weeks from 02/02/2012 through January 2014   . Urinary frequency   . Use of tamoxifen (Nolvadex)   . Vertigo     Medications:  Medications Prior to Admission  Medication Sig Dispense Refill Last Dose  . amLODipine (NORVASC) 5 MG tablet TAKE 1 TABLET BY MOUTH DAILY. 90 tablet 1 Past Week at Unknown time  . Ascorbic Acid (VITAMIN C) 100 MG tablet Take 100 mg by mouth daily.     Past Week at Unknown time  . Calcium Carbonate-Vitamin D (CALTRATE 600+D PO) Take 1 tablet by mouth daily.   Past Week at Unknown time  . diazepam (VALIUM) 10 MG tablet Take 1 tablet (10  mg total) by mouth at bedtime as needed for anxiety. 30 tablet 5 prn at prn  . esomeprazole (NEXIUM) 40 MG capsule Take 40 mg by mouth daily before breakfast.   Past Week at Unknown time  . exemestane (AROMASIN) 25 MG tablet TAKE 1 TABLET BY MOUTH ONCE DAILY AFTER BREAKFAST 90 tablet 3 Past Week at Unknown time  . goserelin (ZOLADEX) 3.6 MG injection Inject 3.6 mg into the skin every 28 (twenty-eight) days.   Past Week at Unknown time  . loperamide (IMODIUM A-D) 2 MG tablet Take 2 mg by mouth 4 (four) times daily as needed for diarrhea or loose stools.    Past Week at Unknown time  . losartan-hydrochlorothiazide (HYZAAR) 50-12.5 MG tablet Take 1 tablet by mouth daily. 90 tablet 0 Past Week at Unknown time  . methocarbamol (ROBAXIN) 500 MG tablet Take 1 tablet (500 mg total) by mouth 4 (four) times daily. 90 tablet 2 Past Week at Unknown time  . Multiple Vitamin (MULTIVITAMIN) tablet Take 1 tablet by mouth daily.     Past Week at Unknown time  . ondansetron (ZOFRAN) 4 MG tablet Take 1 tablet (4 mg total) by mouth every 8 (eight) hours as needed for nausea or vomiting. 20 tablet 0 Past Month at Unknown time  . traZODone (DESYREL) 50 MG tablet Take 0.5-1 tablets (25-50 mg total) by mouth at bedtime as needed for sleep. 90 tablet 1 Past Week at Unknown time  . predniSONE  (DELTASONE) 10 MG tablet 6 tablets daily for 3 days, then reduce by 1 tablet daily until gone (Patient not taking: Reported on 12/16/2017) 21 tablet 0 Completed Course at Unknown time   Assessment: Pharmacy consulted for heparin drip dosing and monitoring in 57 yo female admitted with Hypercalcemia and found to have DVT.   Goal of Therapy:  Heparin level 0.3-0.7 units/ml Monitor platelets by anticoagulation protocol: Yes   Plan:  Daily heparin level is therapeutic once again. CBC looks stable. Continue current rate. Will continue to monitor HL and CBC daily while on UFH per protocol.  Leonna Schlee A. Jordan Hawks, PharmD, BCPS Clinical Pharmacist 12/18/2017

## 2017-12-18 NOTE — Progress Notes (Signed)
Patient has only had 50 cc of urine output since foley was placed at 14:53.Bladder scanned patient, yielded 140cc. Dr. Louis Meckel notified. No new orders received. Per Dr. Louis Meckel will come assess patient tonight.

## 2017-12-18 NOTE — Progress Notes (Signed)
PT Cancellation Note  Patient Details Name: Mary [name] MRN: [id number] DOB: [birth year]   Cancelled Treatment:    Reason Eval/Treat Not Completed: Other (comment).  PT consult received.  Chart reviewed.  Pt currently unavailable for PT evaluation d/t busy with nursing care (per chart order for foley urethral catheter insertion).  Will re-attempt PT evaluation at a later date/time.  Leitha Bleak, PT 12/18/17, 2:38 PM [phone]

## 2017-12-18 NOTE — Clinical Social Work Note (Signed)
CSW received referral for patient wanting information about how to apply for disability.  CSW provided patient Social Security administration contact information for process of applying for disability.  CSW also gave patient information on [place] assistance program since she is an employee of [employer] which may be able to help provide her assistance with applying for disability.  CSW to sign off, please reconsult if social work needs arise.  Jones Broom. [place], MSW, [place]  12/18/2017 11:00 AM

## 2017-12-18 NOTE — Consult Note (Signed)
Saw patient and noted bloody urine, no clots.  I irrigated her bladder with existing foley and was able to irrigate easily without any clots or occlusions noted. Based on her u/s from this afternoon, I suspect that her stents have failed and that she'd be best served with neph tubes at least temporarily.  I spoke with the patient about this, will alert Dr. Erlene Quan in the AM.

## 2017-12-18 NOTE — Progress Notes (Signed)
Bladder scanned patient per order, yielded 80 cc.Will continue to monitor.

## 2017-12-19 ENCOUNTER — Inpatient Hospital Stay: Payer: 59

## 2017-12-19 ENCOUNTER — Other Ambulatory Visit: Payer: Self-pay

## 2017-12-19 DIAGNOSIS — C50919 Malignant neoplasm of unspecified site of unspecified female breast: Secondary | ICD-10-CM

## 2017-12-19 DIAGNOSIS — R600 Localized edema: Secondary | ICD-10-CM

## 2017-12-19 HISTORY — PX: IR NEPHROSTOMY PLACEMENT RIGHT: IMG6064

## 2017-12-19 HISTORY — PX: IR NEPHROSTOMY PLACEMENT LEFT: IMG6063

## 2017-12-19 LAB — CALCIUM, IONIZED: Calcium, Ionized, Serum: 6 mg/dL — ABNORMAL HIGH (ref 4.5–5.6)

## 2017-12-19 LAB — CBC
HCT: 31.4 % — ABNORMAL LOW (ref 35.0–47.0)
Hemoglobin: 10.8 g/dL — ABNORMAL LOW (ref 12.0–16.0)
MCH: 32 pg (ref 26.0–34.0)
MCHC: 34.4 g/dL (ref 32.0–36.0)
MCV: 93 fL (ref 80.0–100.0)
PLATELETS: 217 10*3/uL (ref 150–440)
RBC: 3.38 MIL/uL — ABNORMAL LOW (ref 3.80–5.20)
RDW: 12.4 % (ref 11.5–14.5)
WBC: 9.2 10*3/uL (ref 3.6–11.0)

## 2017-12-19 LAB — BASIC METABOLIC PANEL
ANION GAP: 8 (ref 5–15)
BUN: 30 mg/dL — AB (ref 6–20)
CALCIUM: 8.3 mg/dL — AB (ref 8.9–10.3)
CO2: 20 mmol/L — ABNORMAL LOW (ref 22–32)
Chloride: 109 mmol/L (ref 98–111)
Creatinine, Ser: 4.78 mg/dL — ABNORMAL HIGH (ref 0.44–1.00)
GFR calc Af Amer: 11 mL/min — ABNORMAL LOW (ref >60)
GFR, EST NON AFRICAN AMERICAN: 9 mL/min — AB (ref >60)
GLUCOSE: 133 mg/dL — AB (ref 70–99)
POTASSIUM: 3.2 mmol/L — AB (ref 3.5–5.1)
SODIUM: 137 mmol/L (ref 135–145)

## 2017-12-19 LAB — HIV ANTIBODY (ROUTINE TESTING W REFLEX): HIV SCREEN 4TH GENERATION: NONREACTIVE

## 2017-12-19 LAB — HEPARIN LEVEL (UNFRACTIONATED): Heparin Unfractionated: 0.37 IU/mL (ref 0.30–0.70)

## 2017-12-19 MED ORDER — LIDOCAINE HCL (PF) 1 % IJ SOLN
INTRAMUSCULAR | Status: AC
  Filled 2017-12-19: qty 30

## 2017-12-19 MED ORDER — MIDAZOLAM HCL 5 MG/5ML IJ SOLN
INTRAMUSCULAR | Status: AC | PRN
  Administered 2017-12-19: 1 mg via INTRAVENOUS

## 2017-12-19 MED ORDER — FENTANYL CITRATE (PF) 100 MCG/2ML IJ SOLN
INTRAMUSCULAR | Status: AC
  Filled 2017-12-19: qty 2

## 2017-12-19 MED ORDER — MIDAZOLAM HCL 5 MG/5ML IJ SOLN
INTRAMUSCULAR | Status: AC
  Filled 2017-12-19: qty 5

## 2017-12-19 MED ORDER — IOPAMIDOL (ISOVUE-300) INJECTION 61%
30.0000 mL | Freq: Once | INTRAVENOUS | Status: AC | PRN
  Administered 2017-12-19: 25 mL

## 2017-12-19 MED ORDER — POLYETHYLENE GLYCOL 3350 17 G PO PACK
17.0000 g | PACK | Freq: Every day | ORAL | Status: DC
  Administered 2017-12-19: 17 g via ORAL
  Filled 2017-12-19: qty 1

## 2017-12-19 MED ORDER — CEFAZOLIN SODIUM-DEXTROSE 1-4 GM/50ML-% IV SOLN
1.0000 g | INTRAVENOUS | Status: AC
  Administered 2017-12-19: 1 g via INTRAVENOUS
  Filled 2017-12-19: qty 50

## 2017-12-19 MED ORDER — FENTANYL CITRATE (PF) 100 MCG/2ML IJ SOLN
INTRAMUSCULAR | Status: AC | PRN
  Administered 2017-12-19 (×3): 25 ug via INTRAVENOUS

## 2017-12-19 MED ORDER — CEFAZOLIN SODIUM-DEXTROSE 1-4 GM/50ML-% IV SOLN
INTRAVENOUS | Status: AC
  Filled 2017-12-19: qty 50

## 2017-12-19 NOTE — Progress Notes (Signed)
**Note Mary-Identified via Obfuscation** Patient ID: Mary [name], female   DOB: February 02, 1961, 57 y.o.   MRN: [id number]  acp note  Patient and family at bedside  Dx: acute kidney injury, hypercalcemia, metastatic breast cancer, acute dvt, weakness, dizziness  Code status discussed, full code  Plan: with worsening kidney function, the patient needs better urine flow.  Hopefully can be obtained today with interventional radiology procedure.  Still awaiting biopsies.  Time spent on acp discussion 17 minutes DR Loletha Grayer

## 2017-12-19 NOTE — Procedures (Signed)
Bilateral perc nephrostomy without difficulty  Moderate bloody urine from both tubes likely related to current intervention and known metastatic disease   Complications:  None  Blood Loss: none  See dictation in canopy pacs

## 2017-12-19 NOTE — Progress Notes (Signed)
Urology Consult Follow Up  Subjective: Oliguria overnight.  Foley in place.  Draining light red clear urine.  Creatinine at 4.78.  RUS yesterday afternoon noted moderate bilateral hydronephrosis.  Foley catheter decompresses the bladder.  Bilateral pleural effusions.  Ascites.  Anti-infectives: Anti-infectives (From admission, onward)   Start     Dose/Rate Route Frequency Ordered Stop   12/17/17 0600  ceFAZolin (ANCEF) IVPB 2g/100 mL premix     2 g 200 mL/hr over 30 Minutes Intravenous  Once 12/16/17 1516 12/17/17 0605      Current Facility-Administered Medications  Medication Dose Route Frequency Provider Last Rate Last Dose  . 0.9 %  sodium chloride infusion   Intravenous Continuous Loletha Grayer, MD 75 mL/hr at 12/19/17 0249    . acetaminophen (TYLENOL) tablet 650 mg  650 mg Oral Q6H PRN Arta Silence, MD       Or  . acetaminophen (TYLENOL) suppository 650 mg  650 mg Rectal Q6H PRN Arta Silence, MD      . amLODipine (NORVASC) tablet 5 mg  5 mg Oral Daily Arta Silence, MD   5 mg at 12/18/17 0843  . bisacodyl (DULCOLAX) EC tablet 5 mg  5 mg Oral Daily PRN Arta Silence, MD   5 mg at 12/18/17 1858  . diazepam (VALIUM) tablet 10 mg  10 mg Oral QHS PRN Arta Silence, MD      . exemestane (AROMASIN) tablet 25 mg  25 mg Oral QPC breakfast Arta Silence, MD   25 mg at 12/18/17 0843  . heparin ADULT infusion 100 units/mL (25000 units/255mL sodium chloride 0.45%)  1,100 Units/hr Intravenous Continuous Hallaji, Dani Gobble, RPH 11 mL/hr at 12/18/17 1254 1,100 Units/hr at 12/18/17 1254  . HYDROmorphone (DILAUDID) injection 0.5-1 mg  0.5-1 mg Intravenous Q3H PRN Arta Silence, MD   1 mg at 12/19/17 0238  . Influenza vac split quadrivalent PF (FLUARIX) injection 0.5 mL  0.5 mL Intramuscular Tomorrow-1000 Epifanio Lesches, MD      . ondansetron (ZOFRAN) tablet 4 mg  4 mg Oral Q6H PRN Arta Silence, MD       Or  . ondansetron (ZOFRAN) injection 4  mg  4 mg Intravenous Q6H PRN Arta Silence, MD   4 mg at 12/17/17 1148  . opium-belladonna (B&O SUPPRETTES) 16.2-60 MG suppository 1 suppository  1 suppository Rectal Q8H PRN Hollice Espy, MD      . oxybutynin (DITROPAN) tablet 5 mg  5 mg Oral Q8H PRN Hollice Espy, MD   5 mg at 12/18/17 2134  . pantoprazole (PROTONIX) EC tablet 40 mg  40 mg Oral Daily Arta Silence, MD   40 mg at 12/18/17 0843  . scopolamine (TRANSDERM-SCOP) 1 MG/3DAYS 1.5 mg  1 patch Transdermal Q72H Arta Silence, MD   1.5 mg at 12/16/17 1555  . senna-docusate (Senokot-S) tablet 1 tablet  1 tablet Oral QHS PRN Arta Silence, MD      . traZODone (DESYREL) tablet 25-50 mg  25-50 mg Oral QHS PRN Arta Silence, MD         Objective: Vital signs in last 24 hours: Temp:  [98 F (36.7 C)-98.5 F (36.9 C)] 98.3 F (36.8 C) (10/01 0451) Pulse Rate:  [92-102] 102 (10/01 0451) Resp:  [19] 19 (09/30 1659) BP: (143-155)/(78-82) 155/82 (10/01 0451) SpO2:  [89 %-95 %] 90 % (10/01 0451)  Intake/Output from previous day: 09/30 0701 - 10/01 0700 In: 1870.2 [I.V.:1870.2] Out: 100 [Urine:100] Intake/Output this shift: No intake/output data recorded.   Physical Exam Constitutional: Well nourished.  Alert and oriented, No acute distress. HEENT: [place] AT, moist mucus membranes. Trachea midline, no masses. Cardiovascular: No clubbing, cyanosis, or edema. Respiratory: Normal respiratory effort, no increased work of breathing. GI: Abdomen is soft, non tender, non distended, no abdominal masses. Liver and spleen not palpable.  No hernias appreciated.  Stool sample for occult testing is not indicated.   GU: No CVA tenderness.  No bladder fullness or masses.  Foley in place, draining red clear urine.   Skin: No rashes, bruises or suspicious lesions. Lymph: No cervical or inguinal adenopathy. Neurologic: Grossly intact, no focal deficits, moving all 4 extremities. Psychiatric: Normal mood and  affect.  Lab Results:  Recent Labs    12/18/17 0649 12/19/17 0620  WBC 10.3 9.2  HGB 11.7* 10.8*  HCT 33.8* 31.4*  PLT 230 217   BMET Recent Labs    12/17/17 0458 12/18/17 0649  NA 139 141  K 3.0* 3.3*  CL 105 109  CO2 26 23  GLUCOSE 106* 113*  BUN 19 22*  CREATININE 1.40* 2.31*  CALCIUM 10.8* 9.6   PT/INR Recent Labs    12/16/17 1343  LABPROT 13.2  INR 1.01   ABG No results for input(s): PHART, HCO3 in the last 72 hours.  Invalid input(s): PCO2, PO2  Studies/Results: US Renal  Result Date: 12/18/2017 CLINICAL DATA:  Hydronephrosis aunt LOWER abdominal pain for 2-3 months. History of cystoscopy with bilateral stent placement 12/17/2017. EXAM: RENAL / URINARY TRACT ULTRASOUND COMPLETE COMPARISON:  CT of the abdomen and pelvis on 12/16/2017 FINDINGS: Right Kidney: Length: 12.1 centimeters. There is moderate hydronephrosis. Stent is visualized within the RIGHT renal pelvis. No solid or cystic renal mass. Left Kidney: Length: 12.7 centimeter. There is moderate hydronephrosis. The ureteral stent is not well seen in the LEFT renal pelvis. Bladder: Bladder is decompressed by a Foley catheter. Additional: There are bilateral pleural effusions.  Ascites. IMPRESSION: 1. Moderate bilateral hydronephrosis. 2. Foley catheter decompresses the bladder. 3. Bilateral pleural effusions.  Ascites. Electronically Signed   By: Nolon Nations M.D.   On: 12/18/2017 16:36     Assessment and Plan: Patient with stage III breast cancer that is now diffusely metastasized involving retroperitoneum, omentum, bone, possibly liver and possible bladder with bilateral ureteral obstruction  1.  Bilateral hydroureteronephrosis S/p bilateral stent placement with bladder biopsies on 12/17/2017  Creatinine at 4.78 - currently in conversation regarding placement of  Nephrostomy tubes Bladder biopsies pending   2. Oliguric Creatinine at 4.78      LOS: 3 days    [facility] 12/19/2017

## 2017-12-19 NOTE — Plan of Care (Signed)
  Problem: Education: Goal: Knowledge of General Education information will improve Description: Including pain rating scale, medication(s)/side effects and non-pharmacologic comfort measures Outcome: Progressing   Problem: Activity: Goal: Risk for activity intolerance will decrease Outcome: Progressing   

## 2017-12-19 NOTE — Progress Notes (Signed)
ANTICOAGULATION CONSULT NOTE - Follow up [place] for Heparin  Indication: chest pain/ACS  Allergies  Allergen Reactions  . Adhesive [Tape] Other (See Comments)    Redness:Please use paper tape    Patient Measurements: Height: 5\' 4"  (162.6 cm) Weight: 159 lb 12.8 oz (72.5 kg) IBW/kg (Calculated) : 54.7 Heparin Dosing Weight: 69.6 kg  Vital Signs: Temp: 98.6 F (37 C) (10/01 0848) Temp Source: Oral (10/01 0848) BP: 154/84 (10/01 0848) Pulse Rate: 102 (10/01 0848)  Labs: Recent Labs    12/16/17 1343  12/17/17 0458 12/18/17 0649 12/19/17 0620  HGB  --    < > 11.8* 11.7* 10.8*  HCT  --   --  33.4* 33.8* 31.4*  PLT  --   --  205 230 217  APTT 27  --   --   --   --   LABPROT 13.2  --   --   --   --   INR 1.01  --   --   --   --   HEPARINUNFRC  --    < > 0.42 0.49 0.37  CREATININE  --   --  1.40* 2.31* 4.78*   < > = values in this interval not displayed.    Estimated Creatinine Clearance: 12.8 mL/min (A) (by C-G formula based on SCr of 4.78 mg/dL (H)).   Assessment: Pharmacy consulted for heparin drip dosing and monitoring in 57 yo female admitted with Hypercalcemia and found to have DVT.  Current rate of heparin 1100 units/hr (=11 ml/hr).   Heparin course: 10/01 AM heparin level 0.37. Continue current regimen. Recheck heparin level and CBC with tomorrow AM labs.  Goal of Therapy:  Heparin level 0.3-0.7 units/ml Monitor platelets by anticoagulation protocol: Yes   Plan:  Heparin stopped for procedure at 0948. Per RN procedure planned for ~3 pm. Will need to follow up heparin plans after procedure (anticipate heparin to continue after procedure), when to restart heparin drip after procedure, and recheck HL 8h after restart.    Rayna Sexton, PharmD, BCPS Clinical Pharmacist 12/19/2017 1:12 PM

## 2017-12-19 NOTE — Progress Notes (Signed)
ANTICOAGULATION CONSULT NOTE - Initial Consult  Pharmacy Consult for Heparin  Indication: chest pain/ACS  Allergies  Allergen Reactions  . Adhesive [Tape] Other (See Comments)    Redness:Please use paper tape    Patient Measurements: Height: 5\' 4"  (162.6 cm) Weight: 159 lb 12.8 oz (72.5 kg) IBW/kg (Calculated) : 54.7 Heparin Dosing Weight:   Vital Signs: Temp: 98.3 F (36.8 C) (10/01 0451) Temp Source: Oral (10/01 0451) BP: 155/82 (10/01 0451) Pulse Rate: 102 (10/01 0451)  Labs: Recent Labs    12/16/17 1343  12/17/17 0458 12/18/17 0649 12/19/17 0620  HGB  --    < > 11.8* 11.7* 10.8*  HCT  --   --  33.4* 33.8* 31.4*  PLT  --   --  205 230 217  APTT 27  --   --   --   --   LABPROT 13.2  --   --   --   --   INR 1.01  --   --   --   --   HEPARINUNFRC  --    < > 0.42 0.49 0.37  CREATININE  --   --  1.40* 2.31*  --    < > = values in this interval not displayed.    Estimated Creatinine Clearance: 26.5 mL/min (A) (by C-G formula based on SCr of 2.31 mg/dL (H)).   Medical History: Past Medical History:  Diagnosis Date  . Anxiety    takes Xanax prn  . Breast cancer [facility]) August 2013   bilateral, er/pr+  . Complication of anesthesia    pt states she had the shakes extremely bad  . Cough    at night d/t reflux;nothing productive  . Dental crowns present    x 4  . GERD (gastroesophageal reflux disease)    takes Nexium daily  . History of bronchitis    08/2011  . History of colon polyps   . Hot flashes, menopausal 08/08/2012  . IBS (irritable bowel syndrome)    immodium prn  . Insomnia    takes Ambien nightly  . Irritable bowel syndrome   . Neutropenia, drug-induced ([place]) 12/15/2011  . Nocturia   . Plantar fasciitis   . S/P radiation therapy 07/03/2012 through 08/18/2038                                                        Right chest wall/regional lymph nodes 5040 cGy 28 sessions, right chest wall/mastectomy scar boost 1000 cGy 5 sessions                           . Status post chemotherapy    FEC 100 starting on 12/08/2011 - 01/18/12/single agent Taxol x12 weeks from 02/02/2012 through January 2014   . Urinary frequency   . Use of tamoxifen (Nolvadex)   . Vertigo     Medications:  Medications Prior to Admission  Medication Sig Dispense Refill Last Dose  . amLODipine (NORVASC) 5 MG tablet TAKE 1 TABLET BY MOUTH DAILY. 90 tablet 1 Past Week at Unknown time  . Ascorbic Acid (VITAMIN C) 100 MG tablet Take 100 mg by mouth daily.     Past Week at Unknown time  . Calcium Carbonate-Vitamin D (CALTRATE 600+D PO) Take 1 tablet by mouth daily.   Past  Week at Unknown time  . diazepam (VALIUM) 10 MG tablet Take 1 tablet (10 mg total) by mouth at bedtime as needed for anxiety. 30 tablet 5 prn at prn  . esomeprazole (NEXIUM) 40 MG capsule Take 40 mg by mouth daily before breakfast.   Past Week at Unknown time  . exemestane (AROMASIN) 25 MG tablet TAKE 1 TABLET BY MOUTH ONCE DAILY AFTER BREAKFAST 90 tablet 3 Past Week at Unknown time  . goserelin (ZOLADEX) 3.6 MG injection Inject 3.6 mg into the skin every 28 (twenty-eight) days.   Past Week at Unknown time  . loperamide (IMODIUM A-D) 2 MG tablet Take 2 mg by mouth 4 (four) times daily as needed for diarrhea or loose stools.    Past Week at Unknown time  . losartan-hydrochlorothiazide (HYZAAR) 50-12.5 MG tablet Take 1 tablet by mouth daily. 90 tablet 0 Past Week at Unknown time  . methocarbamol (ROBAXIN) 500 MG tablet Take 1 tablet (500 mg total) by mouth 4 (four) times daily. 90 tablet 2 Past Week at Unknown time  . Multiple Vitamin (MULTIVITAMIN) tablet Take 1 tablet by mouth daily.     Past Week at Unknown time  . ondansetron (ZOFRAN) 4 MG tablet Take 1 tablet (4 mg total) by mouth every 8 (eight) hours as needed for nausea or vomiting. 20 tablet 0 Past Month at Unknown time  . traZODone (DESYREL) 50 MG tablet Take 0.5-1 tablets (25-50 mg total) by mouth at bedtime as needed for sleep. 90 tablet 1 Past Week  at Unknown time  . predniSONE (DELTASONE) 10 MG tablet 6 tablets daily for 3 days, then reduce by 1 tablet daily until gone (Patient not taking: Reported on 12/16/2017) 21 tablet 0 Completed Course at Unknown time   Assessment: Pharmacy consulted for heparin drip dosing and monitoring in 57 yo female admitted with Hypercalcemia and found to have DVT.   Goal of Therapy:  Heparin level 0.3-0.7 units/ml Monitor platelets by anticoagulation protocol: Yes   Plan:  Daily heparin level is therapeutic once again. CBC looks stable. Continue current rate. Will continue to monitor HL and CBC daily while on UFH per protocol.  10/01 AM heparin level 0.37. Continue current regimen. Recheck heparin level and CBC with tomorrow AM labs.  Sim Boast, PharmD, BCPS  12/19/17 7:12 AM

## 2017-12-19 NOTE — Progress Notes (Signed)
Hematology/Oncology Progress Note [facility] Telephone:([phone] [phone]  Patient Care Team: [name], MD as PCP - General (Internal Medicine) Serena Colonel, RN as [place] Management   Name of the patient: Mary [name]  [id number]  03/14/1961  Date of visit: 12/19/17   INTERVAL HISTORY-  No acute overnight events.  Creatinine continue to rise, this morning creatinine 4.78. Continue to have lower abdominal discomfort.   Has not passed any bowel movement today.  Last bowel movement was on Sunday  Review of Systems  Constitutional: Positive for malaise/fatigue.  HENT: Negative for sore throat.   Eyes: Negative for blurred vision.  Cardiovascular: Negative for chest pain.  Gastrointestinal: Negative for nausea and vomiting.  Genitourinary: Positive for urgency.  Skin: Negative for rash.  Neurological: Positive for dizziness.  Psychiatric/Behavioral: Negative for hallucinations.     Allergies  Allergen Reactions  . Adhesive [Tape] Other (See Comments)    Redness:Please use paper tape    Patient Active Problem List   Diagnosis Date Noted  . Hypercalcemia 12/16/2017  . Malignancy ([place])   . Acute deep vein thrombosis (DVT) of distal vein of left lower extremity ([place])   . Dizziness   . Bilateral posterior neck pain 11/24/2017  . Elevated liver enzymes 11/24/2017  . Tubular adenoma of colon 10/11/2017  . 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 Generalized anxiety disorder 09/19/2017  . Vertigo, labyrinthine, bilateral 08/27/2017  . Prediabetes 08/27/2017  . Encounter for screening colonoscopy 09/21/2014  . Other malaise and fatigue 12/14/2013  . Hot flashes, menopausal 08/08/2012  . Neutropenia, drug-induced ([place]) 12/15/2011  . Primary cancer of upper outer quadrant of left female breast ([place]) 11/17/2011  . Hypertension 09/23/2011  . PLANTAR FASCIITIS, LEFT 12/01/2009  . UNEQUAL LEG  LENGTH 12/01/2009     Past Medical History:  Diagnosis Date  . Anxiety    takes Xanax prn  . Breast cancer [facility]) August 2013   bilateral, er/pr+  . Complication of anesthesia    pt states she had the shakes extremely bad  . Cough    at night d/t reflux;nothing productive  . Dental crowns present    x 4  . GERD (gastroesophageal reflux disease)    takes Nexium daily  . History of bronchitis    08/2011  . History of colon polyps   . Hot flashes, menopausal 08/08/2012  . IBS (irritable bowel syndrome)    immodium prn  . Insomnia    takes Ambien nightly  . Irritable bowel syndrome   . Neutropenia, drug-induced ([place]) 12/15/2011  . Nocturia   . Plantar fasciitis   . S/P radiation therapy 07/03/2012 through 08/18/2038                                                        Right chest wall/regional lymph nodes 5040 cGy 28 sessions, right chest wall/mastectomy scar boost 1000 cGy 5 sessions                          . Status post chemotherapy    FEC 100 starting on 12/08/2011 - 01/18/12/single agent Taxol x12 weeks from 02/02/2012 through January 2014   . Urinary frequency   . Use of tamoxifen (Nolvadex)   . Vertigo  Past Surgical History:  Procedure Laterality Date  . BREAST LUMPECTOMY  2008   left  . COLONOSCOPY    . COLONOSCOPY WITH PROPOFOL N/A 10/10/2017   Procedure: COLONOSCOPY WITH PROPOFOL;  Surgeon: Lucilla Lame, MD;  Location: [facility] ENDOSCOPY;  Service: Endoscopy;  Laterality: N/A;  . CYSTOSCOPY WITH STENT PLACEMENT Bilateral 12/17/2017   Procedure: CYSTOSCOPY WITH STENT PLACEMENT;  Surgeon: Hollice Espy, MD;  Location: [facility] ORS;  Service: Urology;  Laterality: Bilateral;  . MASTECTOMY    . MASTECTOMY WITH AXILLARY LYMPH NODE DISSECTION Right 05/18/2012   Procedure: MASTECTOMY WITH AXILLARY LYMPH NODE DISSECTION;  Surgeon: Adin Hector, MD;  Location: [place];  Service: General;  Laterality: Right;  Bilateral Total Mastectomy , right axillary lymph node dissection  left axillary sentinel node biopsy, removal of Porta Cath  . PORT-A-CATH REMOVAL N/A 05/18/2012   Procedure: REMOVAL PORT-A-CATH;  Surgeon: Adin Hector, MD;  Location: [place];  Service: General;  Laterality: N/A;  . PORTACATH PLACEMENT  12/02/2011   Procedure: INSERTION PORT-A-CATH;  Surgeon: Adin Hector, MD;  Location: [place];  Service: General;  Laterality: N/A;  insertion port a cath with fluoroscopy  . SIMPLE MASTECTOMY WITH AXILLARY SENTINEL NODE BIOPSY Left 05/18/2012   Procedure: SIMPLE MASTECTOMY WITH AXILLARY SENTINEL NODE BIOPSY;  Surgeon: Adin Hector, MD;  Location: [place];  Service: General;  Laterality: Left;    Social History   Socioeconomic History  . Marital status: Married    Spouse name: Not on file  . Number of children: Not on file  . Years of education: Not on file  . Highest education level: Not on file  Occupational History  . Occupation: Network engineer II    Employer: [employer]  Social Needs  . Financial resource strain: Not on file  . Food insecurity:    Worry: Not on file    Inability: Not on file  . Transportation needs:    Medical: Not on file    Non-medical: Not on file  Tobacco Use  . Smoking status: Former Smoker    Packs/day: 0.50    Last attempt to quit: 03/21/1997    Years since quitting: 20.7  . Smokeless tobacco: Never Used  Substance and Sexual Activity  . Alcohol use: Yes    Alcohol/week: 0.0 standard drinks    Comment: occasionally wine  . Drug use: No  . Sexual activity: Yes    Birth control/protection: None  Lifestyle  . Physical activity:    Days per week: Not on file    Minutes per session: Not on file  . Stress: Not on file  Relationships  . Social connections:    Talks on phone: Not on file    Gets together: Not on file    Attends religious service: Not on file    Active member of club or organization: Not on file    Attends meetings of clubs or organizations: Not on file    Relationship status:  Not on file  . Intimate partner violence:    Fear of current or ex partner: Not on file    Emotionally abused: Not on file    Physically abused: Not on file    Forced sexual activity: Not on file  Other Topics Concern  . Not on file  Social History Narrative   Married to [employer]  No children 60 years old with fmp.  20 year use of birth control pills.       Family History  Problem Relation Age of Onset  . Hypertension Mother   . Hypertension Maternal Grandmother   . Pancreatic cancer Paternal Grandmother        diagnosed in her 29s  . Ovarian cancer Maternal Aunt        maternal grandmother's sister  . Breast cancer Paternal Aunt        diagnosed in late 76s early 78s  . Parkinsonism Paternal Grandfather      Current Facility-Administered Medications:  .  0.9 %  sodium chloride infusion, , Intravenous, Continuous, Wieting, Richard, MD, Last Rate: 40 mL/hr at 12/19/17 1044 .  acetaminophen (TYLENOL) tablet 650 mg, 650 mg, Oral, Q6H PRN, 650 mg at 12/19/17 1006 **OR** acetaminophen (TYLENOL) suppository 650 mg, 650 mg, Rectal, Q6H PRN, Jodell Cipro, Prasanna, MD .  amLODipine (NORVASC) tablet 5 mg, 5 mg, Oral, Daily, Jodell Cipro, Prasanna, MD, 5 mg at 12/19/17 1006 .  bisacodyl (DULCOLAX) EC tablet 5 mg, 5 mg, Oral, Daily PRN, Arta Silence, MD, 5 mg at 12/19/17 1006 .  diazepam (VALIUM) tablet 10 mg, 10 mg, Oral, QHS PRN, Jodell Cipro, Prasanna, MD .  exemestane (AROMASIN) tablet 25 mg, 25 mg, Oral, QPC breakfast, Jodell Cipro, Prasanna, MD, 25 mg at 12/19/17 1006 .  [COMPLETED] heparin bolus via infusion 4,000 Units, 4,000 Units, Intravenous, Once, 4,000 Units at 12/16/17 1457 **FOLLOWED BY** heparin ADULT infusion 100 units/mL (25000 units/27m sodium chloride 0.45%), 1,100 Units/hr, Intravenous, Continuous, Hallaji, Sheema M, RPH, Stopped at 12/19/17 0948 .  HYDROmorphone (DILAUDID) injection 0.5-1 mg, 0.5-1 mg, Intravenous, Q3H PRN, SJodell Cipro Prasanna, MD, 0.5 mg at 12/19/17  1049 .  Influenza vac split quadrivalent PF (FLUARIX) injection 0.5 mL, 0.5 mL, Intramuscular, Tomorrow-1000, Konidena, Snehalatha, MD .  ondansetron (ZOFRAN) tablet 4 mg, 4 mg, Oral, Q6H PRN **OR** ondansetron (ZOFRAN) injection 4 mg, 4 mg, Intravenous, Q6H PRN, SJodell Cipro Prasanna, MD, 4 mg at 12/17/17 1148 .  opium-belladonna (B&O SUPPRETTES) 16.2-60 MG suppository 1 suppository, 1 suppository, Rectal, Q8H PRN, BHollice Espy MD .  oxybutynin ([facility] tablet 5 mg, 5 mg, Oral, Q8H PRN, BHollice Espy MD, 5 mg at 12/18/17 2134 .  pantoprazole (PROTONIX) EC tablet 40 mg, 40 mg, Oral, Daily, SJodell Cipro Prasanna, MD, 40 mg at 12/19/17 1006 .  polyethylene glycol (MIRALAX / GLYCOLAX) packet 17 g, 17 g, Oral, q1800, Wieting, Richard, MD .  scopolamine (TRANSDERM-SCOP) 1 MG/3DAYS 1.5 mg, 1 patch, Transdermal, Q72H, Sridharan, Prasanna, MD, 1.5 mg at 12/19/17 1009 .  senna-docusate (Senokot-S) tablet 1 tablet, 1 tablet, Oral, QHS PRN, SJodell Cipro Prasanna, MD .  traZODone (DESYREL) tablet 25-50 mg, 25-50 mg, Oral, QHS PRN, SArta Silence MD   Physical exam:  Vitals:   12/18/17 1659 12/18/17 1945 12/19/17 0451 12/19/17 0848  BP: (!) 148/78 (!) 143/80 (!) 155/82 (!) 154/84  Pulse: 92 100 (!) 102 (!) 102  Resp: 19   18  Temp: 98.5 F (36.9 C) 98.5 F (36.9 C) 98.3 F (36.8 C) 98.6 F (37 C)  TempSrc: Oral Oral Oral Oral  SpO2: 94% 95% 90% 92%  Weight:      Height:      Physical Exam  Constitutional: She is oriented to person, place, and time. No distress.  HENT:  Head: Normocephalic and atraumatic.  Mouth/Throat: Oropharynx is clear and moist.  Eyes: Pupils are equal, round, and reactive to light. EOM are normal. No scleral icterus.  Neck: Normal range of motion. Neck supple.  Cardiovascular: Normal rate, regular rhythm and normal heart sounds.  No murmur heard. Pulmonary/Chest: Effort normal. No respiratory  distress. She has no wheezes.  Abdominal: Soft. Bowel sounds are  normal. She exhibits distension. She exhibits no mass. There is no tenderness.  Musculoskeletal: Normal range of motion. She exhibits edema. She exhibits no deformity.  Neurological: She is alert and oriented to person, place, and time. No cranial nerve deficit. Coordination normal.  Skin: Skin is warm and dry. No rash noted. No erythema.  Psychiatric: She has a normal mood and affect. Her behavior is normal. Thought content normal.       CMP Latest Ref Rng & Units 12/19/2017  Glucose 70 - 99 mg/dL 133(H)  BUN 6 - 20 mg/dL 30(H)  Creatinine 0.44 - 1.00 mg/dL 4.78(H)  Sodium 135 - 145 mmol/L 137  Potassium 3.5 - 5.1 mmol/L 3.2(L)  Chloride 98 - 111 mmol/L 109  CO2 22 - 32 mmol/L 20(L)  Calcium 8.9 - 10.3 mg/dL 8.3(L)  Total Protein 6.5 - 8.1 g/dL -  Total Bilirubin 0.3 - 1.2 mg/dL -  Alkaline Phos 38 - 126 U/L -  AST 15 - 41 U/L -  ALT 0 - 44 U/L -   CBC Latest Ref Rng & Units 12/19/2017  WBC 3.6 - 11.0 K/uL 9.2  Hemoglobin 12.0 - 16.0 g/dL 10.8(L)  Hematocrit 35.0 - 47.0 % 31.4(L)  Platelets 150 - 440 K/uL 217   RADIOGRAPHIC STUDIES: I have personally reviewed the radiological images as listed and agreed with the findings in the report. Dg Cervical Spine Complete  Result Date: 11/22/2017 CLINICAL DATA:  Neck stiffness for 2 weeks.  Occipital headaches EXAM: CERVICAL SPINE - COMPLETE 4+ VIEW COMPARISON:  None similar FINDINGS: Focal C5-6 disc narrowing with mild endplate ridging and retrolisthesis. No evidence of fracture or bone lesion. No noted facet spurring or erosion. No prevertebral thickening. IMPRESSION: 1. Focal C5-6 disc degeneration. 2. No acute finding. Electronically Signed   By: Monte Fantasia M.D.   On: 11/22/2017 16:02   US Renal  Result Date: 12/18/2017 CLINICAL DATA:  Hydronephrosis aunt LOWER abdominal pain for 2-3 months. History of cystoscopy with bilateral stent placement 12/17/2017. EXAM: RENAL / URINARY TRACT ULTRASOUND COMPLETE COMPARISON:  CT of the  abdomen and pelvis on 12/16/2017 FINDINGS: Right Kidney: Length: 12.1 centimeters. There is moderate hydronephrosis. Stent is visualized within the RIGHT renal pelvis. No solid or cystic renal mass. Left Kidney: Length: 12.7 centimeter. There is moderate hydronephrosis. The ureteral stent is not well seen in the LEFT renal pelvis. Bladder: Bladder is decompressed by a Foley catheter. Additional: There are bilateral pleural effusions.  Ascites. IMPRESSION: 1. Moderate bilateral hydronephrosis. 2. Foley catheter decompresses the bladder. 3. Bilateral pleural effusions.  Ascites. Electronically Signed   By: Nolon Nations M.D.   On: 12/18/2017 16:36   US Venous Img Lower Unilateral Left  Result Date: 12/16/2017 CLINICAL DATA:  Lower extremity edema EXAM: LEFT LOWER EXTREMITY VENOUS DUPLEX ULTRASOUND TECHNIQUE: Doppler venous assessment of the left lower extremity deep venous system was performed, including characterization of spectral flow, compressibility, and phasicity. COMPARISON:  None. FINDINGS: There is complete compressibility of the left common femoral and femoral veins. The left popliteal vein is partially occlusive and contains partially occlusive DVT. Thrombus is mixed echogenicity with areas of hypoechoic and hyperechoic signal. There is occlusive thrombus in the posterior tibial and peroneal veins. Doppler analysis demonstrates respiratory phasicity and augmentation. IMPRESSION: The study is positive for DVT in the popliteal, posterior tibial, and peroneal veins as described. Critical Value/emergent results were called by telephone at the time of interpretation on  12/16/2017 at 9:24 am to Dr. Alfred Levins, who verbally acknowledged these results. Electronically Signed   By: Marybelle Killings M.D.   On: 12/16/2017 09:25   Ct Renal Stone Study  Result Date: 12/16/2017 CLINICAL DATA:  57 year old female with abdominal pain. History of breast cancer and bilateral mastectomy. Patient is on chemo and radiation  treatment. EXAM: CT ABDOMEN AND PELVIS WITHOUT CONTRAST TECHNIQUE: Multidetector CT imaging of the abdomen and pelvis was performed following the standard protocol without IV contrast. COMPARISON:  PET-CT dated 12/01/2011 FINDINGS: Evaluation of this exam is limited in the absence of intravenous contrast. Lower chest: Partially visualized small to moderate bilateral pleural effusions with associated partial compressive atelectasis of the adjacent lungs and areas of linear atelectasis/scarring. No intra-abdominal free air. Diffuse mesenteric edema and stranding and small ascites. Hepatobiliary: Subcentimeter hypodense focus in the right lobe of the liver is not characterized. The liver is otherwise unremarkable on this noncontrast CT. Probable tiny stone or sludge within the gallbladder. Pancreas: Unremarkable. No pancreatic ductal dilatation or surrounding inflammatory changes. Spleen: Normal in size without focal abnormality. Adrenals/Urinary Tract: Mild bilateral adrenal thickening. There is moderate bilateral hydronephrosis. The ureters are nondistended. The hydronephrosis is likely related to a degree of stricture at the ureteropelvic junctions secondary to retroperitoneal adhesions/fibrosis. No stone identified within the kidneys or along the course of the ureters. There is masslike thickening of the anterior and left lateral bladder wall likely metastatic disease. Stomach/Bowel: There is diffuse thickening of the stomach which may be partly related to underdistention and ascites. Gastritis or an infiltrative process is not entirely excluded. There is no bowel obstruction. The appendix is normal as visualized. Vascular/Lymphatic: Mild aortoiliac atherosclerotic disease. The abdominal aorta and IVC are grossly unremarkable on this noncontrast CT. No portal venous gas. There are multiple somewhat small retroperitoneal and para-aortic lymph nodes. A mildly enlarged right iliac chain node measures 11 mm in short  axis. There is diffuse mesenteric and omental nodularity and edema consistent with metastatic implants. Multiple top-normal and mildly rounded mesenteric lymph nodes measure up to 8 mm in short axis. Reproductive: Evaluation of the pelvic structures is very limited. The uterus appears retroflexed. A 3.3 x 3.9 x 2.6 cm ill-defined high attenuating solid lesion to the right of the uterine fundus is not well evaluated and may represent a fibroid or a complex lesion arising from the right adnexa. However, a neoplastic mass is not excluded. Pelvic ultrasound may provide better evaluation. Other: There is mild diffuse subcutaneous edema of the abdominal wall. Musculoskeletal: Multiple small scattered sclerotic lesions throughout the spine most consistent with metastatic disease. No acute fracture. IMPRESSION: 1. Moderate bilateral hydronephrosis, likely related to strictures at the level of the UPJ's, and likely secondary to retroperitoneal adhesions or fibrosis. No obstructing stone. 2. Masslike thickening of the anterior and left lateral bladder wall suspicious for metastatic implant. 3. Mesenteric, omental, and peritoneal implants with a small ascites and diffuse mesenteric edema. 4. Small to moderate bilateral pleural effusions with associated compressive atelectasis of the lungs. 5. Diffuse small osseous sclerotic metastatic disease. Electronically Signed   By: Anner Crete M.D.   On: 12/16/2017 06:53    Assessment and plan-  Patient is a 57 y.o. female  female with history of Stage III right breast cancer, ER/PR positive and HER2 negative, s/p chemotherapy, B/L mastectomy, adjuvant RT and on extended Aromasin and Zolodex presents with abdominal pain  #Stage IV metastatic breast cancer, infiltrative bladder mass pathology came back positive for metastatic carcinoma, consistent  with patient's known history of breast carcinoma. Discussed with pathology Dr. Reuel Derby, will send ER PR HER-2  immunohistochemistry.  Ki-67. Discussed with patient that she has metastatic cancer, which is not curable.  Goal of treatment is with palliative intent. Awaiting ER PR HER-2 status.  Given that she has visceral metastasis, considering starting palliative chemotherapy to achieve quicker reduction of tumor burden. Will wait until her kidney function to be stabilized first.   #Acute renal failure, worsened despite placement of ureteral stent. Discussed with urology.  Patient needs percutaneous nephrostomy. Nephrology has been consulted for further evaluation.  # Hypercalcemia, likely due to metastatic cancer, she already received calcitonin 4 units/kg x 1, about to start Zometa 4 mg x 1 Calcium level has trended down.  Normalized.  Continue monitor.  Continue gentle hydration.  #Left lower extremity below-knee acute DVT, in the context of widely metastatic malignancy, continue heparin gtt for now as she may need additional procedures.  On discharge, she may be switched to Eliquis.   #Onoging Dizziness/lentigo since April 2019.  Patient has had a negative MRI of brain in April 2019.  repeat MRI brain. If negative, consider LP.  Thank you for allowing me to participate in the care of this patient.   Earlie Server, MD, PhD Hematology Oncology [facility] at [facility] Pager- [phone] 12/19/2017

## 2017-12-19 NOTE — Evaluation (Signed)
Physical Therapy Evaluation Patient Details Name: Mary [name] MRN: [id number] DOB: 05/29/1960 Today's Date: 12/19/2017   History of Present Illness  Pt is a 57 y.o. female presenting to hospital 12/16/17 after critical lab of calcemia of 13.9.  Per chart pt has had vertigo since 06/2017.  Also twisted L ankle getting out of car Monday 12/11/17.  Pt (+) for DVT L LE 9/28.  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  S/p cystoscopy with stent placement B 9/29.  PMH includes R mastectomy, breast CA, IBS, neutropenia, plantar fasciitis, s/p chemo, vertigo.  Clinical Impression  Prior to hospital admission, pt was independent.  Pt lives with her husband in 1 level home with 2 steps to enter (no railing).  Currently pt is modified independent semi-supine to sit; CGA with transfers; and CGA with ambulation.  Pt feeling unsteady without UE support during ambulation and preferred to have at least single UE support on IV pole.  Overall pt demonstrating generalized weakness, impaired balance, and decreased activity tolerance compared to baseline.  Pt reporting no vertigo symptoms entire session.  O2 sats 89% or greater on 2 L O2 via nasal cannula during session.  Pt would benefit from skilled PT to address noted impairments and functional limitations (see below for any additional details).  Upon hospital discharge, recommend pt discharge to home with OP PT (pending pt's progress during hospitalization).    Follow Up Recommendations Outpatient PT    Equipment Recommendations  Cane    Recommendations for Other Services       Precautions / Restrictions Precautions Precautions: Fall Precaution Comments: Foley catheter Restrictions Weight Bearing Restrictions: No      Mobility  Bed Mobility Overal bed mobility: Modified Independent              General bed mobility comments: Semi-supine to sit with mild increased effort.  Transfers Overall transfer level: Needs assistance Equipment used: None Transfers: Sit to/from Stand Sit to Stand: Min guard         General transfer comment: pt holding onto objects to assist with standing; increased effort to stand noted; CGA for safety  Ambulation/Gait Ambulation/Gait assistance: Min guard Gait Distance (Feet): (25 feet (bed to bathroom) B UE support on IV pole; 100 feet (no UE support); 8o feet (B UE support on IV pole)) Assistive device: IV Pole;None   Gait velocity: decreased   General Gait Details: decreased B step length/foot clearance/heelstrike; occasionally reaching for objects when ambulating without UE support (pt feeling unsteady although no overt loss of balance noted); limited distance d/t weakness/fatigue  Stairs            Wheelchair Mobility    Modified Rankin (Stroke Patients Only)       Balance Overall balance assessment: Needs assistance Sitting-balance support: No upper extremity supported;Feet supported Sitting balance-Leahy Scale: Normal Sitting balance - Comments: steady sitting reaching outside BOS   Standing balance support: No upper extremity supported Standing balance-Leahy Scale: Good Standing balance comment: steady standing reaching within BOS                             Pertinent Vitals/Pain Pain Assessment: No/denies pain  HR 102-124 bpm during session.    Home Living Family/patient expects to be discharged to:: Private residence Living Arrangements: Spouse/significant other Available Help at Discharge: Family Type of Home: House Home Access:  Stairs to enter Entrance Stairs-Rails: None Entrance Stairs-Number of Steps: 2 Home Layout: One level Home Equipment: None      Prior Function Level of Independence: Independent         Comments: Pt reports 1 fall last Friday d/t vertigo (no injuries).     Hand  Dominance        Extremity/Trunk Assessment   Upper Extremity Assessment Upper Extremity Assessment: Generalized weakness    Lower Extremity Assessment Lower Extremity Assessment: Generalized weakness    Cervical / Trunk Assessment Cervical / Trunk Assessment: Normal  Communication   Communication: No difficulties  Cognition Arousal/Alertness: Awake/alert Behavior During Therapy: WFL for tasks assessed/performed Overall Cognitive Status: Within Functional Limits for tasks assessed                                        General Comments General comments (skin integrity, edema, etc.): L LE increased swelling compared to R LE.  Nursing cleared pt for participation in physical therapy.  Pt agreeable to PT session.  Pt's husband present during session.    Exercises  Ambulation   Assessment/Plan    PT Assessment Patient needs continued PT services  PT Problem List Decreased strength;Decreased activity tolerance;Decreased balance;Decreased mobility;Decreased knowledge of use of DME       PT Treatment Interventions DME instruction;Gait training;Stair training;Functional mobility training;Therapeutic activities;Therapeutic exercise;Balance training;Patient/family education    PT Goals (Current goals can be found in the Care Plan section)  Acute Rehab PT Goals Patient Stated Goal: to improve strength PT Goal Formulation: With patient Time For Goal Achievement: 01/02/18 Potential to Achieve Goals: Good    Frequency Min 2X/week   Barriers to discharge        Co-evaluation               AM-PAC PT "6 Clicks" Daily Activity  Outcome Measure Difficulty turning over in bed (including adjusting bedclothes, sheets and blankets)?: None Difficulty moving from lying on back to sitting on the side of the bed? : A Little Difficulty sitting down on and standing up from a chair with arms (e.g., wheelchair, bedside commode, etc,.)?: Unable Help needed moving to  and from a bed to chair (including a wheelchair)?: A Little Help needed walking in hospital room?: A Little Help needed climbing 3-5 steps with a railing? : A Little 6 Click Score: 17    End of Session Equipment Utilized During Treatment: Gait belt Activity Tolerance: Patient limited by fatigue Patient left: in chair;with call bell/phone within reach;with chair alarm set;with nursing/sitter in room;with family/visitor present Nurse Communication: Mobility status;Precautions;Other (comment)(Bloody leakage noted through gown (NT came to assist pt with clean-up; nurse already aware of foley catheter leaking)) PT Visit Diagnosis: Other abnormalities of gait and mobility (R26.89);Unsteadiness on feet (R26.81);Muscle weakness (generalized) (M62.81);History of falling (Z91.81)    Time: 9983-3825 PT Time Calculation (min) (ACUTE ONLY): 34 min   Charges:   PT Evaluation $PT Eval Low Complexity: 1 Low PT Treatments $Therapeutic Exercise: 8-22 mins       Leitha Bleak, PT 12/19/17, 10:21 AM [phone]

## 2017-12-19 NOTE — Progress Notes (Signed)
**Note Mary-Identified via Obfuscation** Patient ID: Mary [name], female   DOB: 1960/10/30, 57 y.o.   MRN: [id number]  [facility] PROGRESS NOTE  MARENDA ACCARDI CZY:[id number] DOB: Nov 06, 1960 DOA: 12/16/2017 PCP: [name], MD  HPI/Subjective: Patient still with very little urine in foley.  Creatinine has worsened today.  No nausea or vomiting.   Objective: Vitals:   12/19/17 0451 12/19/17 0848  BP: (!) 155/82 (!) 154/84  Pulse: (!) 102 (!) 102  Resp:  18  Temp: 98.3 F (36.8 C) 98.6 F (37 C)  SpO2: 90% 92%    Filed Weights   12/16/17 0318 12/16/17 1225  Weight: 72.6 kg 72.5 kg    ROS: Review of Systems  Constitutional: Negative for chills and fever.  Eyes: Negative for blurred vision.  Respiratory: Negative for cough and shortness of breath.   Cardiovascular: Negative for chest pain.  Gastrointestinal: Positive for abdominal pain and constipation. Negative for diarrhea, nausea and vomiting.  Genitourinary: Negative for dysuria.  Musculoskeletal: Negative for joint pain.  Neurological: Positive for dizziness. Negative for headaches.   Exam: Physical Exam  Constitutional: She is oriented to person, place, and time.  HENT:  Nose: No mucosal edema.  Mouth/Throat: No oropharyngeal exudate or posterior oropharyngeal edema.  Eyes: Pupils are equal, round, and reactive to light. Conjunctivae, EOM and lids are normal.  Neck: No JVD present. Carotid bruit is not present. No edema present. No thyroid mass and no thyromegaly present.  Cardiovascular: S1 normal and S2 normal. Exam reveals no gallop.  No murmur heard. Pulses:      Dorsalis pedis pulses are 2+ on the right side, and 2+ on the left side.  Respiratory: No respiratory distress. She has no wheezes. She has no rhonchi. She has no rales.  GI: Soft. Bowel sounds are normal. She exhibits distension. There is generalized tenderness.  Musculoskeletal:       Right ankle: She exhibits no swelling.       Left ankle: She exhibits no swelling.   Lymphadenopathy:    She has no cervical adenopathy.  Neurological: She is alert and oriented to person, place, and time. No cranial nerve deficit.  Skin: Skin is warm. No rash noted. Nails show no clubbing.  Psychiatric: She has a normal mood and affect.      Data Reviewed: Basic Metabolic Panel: Recent Labs  Lab 12/15/17 1525 12/16/17 0332 12/16/17 1438 12/17/17 0458 12/18/17 0649 12/19/17 0620  NA 138 139  --  139 141 137  K 3.2* 3.0*  --  3.0* 3.3* 3.2*  CL 98 102  --  105 109 109  CO2 30 28  --  26 23 20*  GLUCOSE 122* 113*  --  106* 113* 133*  BUN 30* 30*  --  19 22* 30*  CREATININE 1.58* 1.63*  --  1.40* 2.31* 4.78*  CALCIUM 13.1* 13.9* 12.2* 10.8* 9.6 8.3*  MG  --  2.1  --   --   --   --   PHOS  --  3.1  --   --   --   --    Liver Function Tests: Recent Labs  Lab 12/15/17 1525 12/16/17 0332  AST 36* 39  ALT 37* 43  ALKPHOS  --  110  BILITOT 0.3 0.6  PROT 6.4 6.9  ALBUMIN  --  3.8   CBC: Recent Labs  Lab 12/16/17 0332 12/17/17 0458 12/18/17 0649 12/19/17 0620  WBC 7.6 7.0 10.3 9.2  HGB 13.0 11.8* 11.7* 10.8*  HCT 37.5  33.4* 33.8* 31.4*  MCV 91.3 91.5 93.2 93.0  PLT 201 205 230 217     Studies: US Renal  Result Date: 12/18/2017 CLINICAL DATA:  Hydronephrosis aunt LOWER abdominal pain for 2-3 months. History of cystoscopy with bilateral stent placement 12/17/2017. EXAM: RENAL / URINARY TRACT ULTRASOUND COMPLETE COMPARISON:  CT of the abdomen and pelvis on 12/16/2017 FINDINGS: Right Kidney: Length: 12.1 centimeters. There is moderate hydronephrosis. Stent is visualized within the RIGHT renal pelvis. No solid or cystic renal mass. Left Kidney: Length: 12.7 centimeter. There is moderate hydronephrosis. The ureteral stent is not well seen in the LEFT renal pelvis. Bladder: Bladder is decompressed by a Foley catheter. Additional: There are bilateral pleural effusions.  Ascites. IMPRESSION: 1. Moderate bilateral hydronephrosis. 2. Foley catheter  decompresses the bladder. 3. Bilateral pleural effusions.  Ascites. Electronically Signed   By: Nolon Nations M.D.   On: 12/18/2017 16:36    Scheduled Meds: . amLODipine  5 mg Oral Daily  . exemestane  25 mg Oral QPC breakfast  . Influenza vac split quadrivalent PF  0.5 mL Intramuscular Tomorrow-1000  . pantoprazole  40 mg Oral Daily  . scopolamine  1 patch Transdermal Q72H   Continuous Infusions: . sodium chloride 40 mL/hr at 12/19/17 1044  . heparin Stopped (12/19/17 0948)    Assessment/Plan:  1. Acute kidney injury.  Creatinine worsened again today.  Urology contacted interventional radiology to evaluate her urine flow and place nephrostomy tubes if needed. Nephrology consult just in case renal function does not improve. 2. Severe hypercalcemia.  Calcium improved to normal range.  IV fluid hydration.  Patient received a dose of Zometa.  3. Metastatic breast cancer.  Follow-up with oncology.  Biopsies still pending. 4. Acute DVT left lower extremity.  Holding heparin drip for procedure today.  5. Dizziness likely inner ear issue.  Tympanic membrane normal bilaterally.  MRI brain with contrast unable to be done at this time.. 6. Weakness.  Physical therapy appreciated 7. Hypertension on norvasc  Code Status:     Code Status Orders  (From admission, onward)         Start     Ordered   12/16/17 1248  Full code  Continuous     12/16/17 1247        Code Status History    Date Active Date Inactive Code Status Order ID Comments User Context   05/18/2012 1813 05/20/2012 1306 Full Code [id number]  Adin Hector, MD Inpatient    Advance Directive Documentation     Most Recent Value  Type of Advance Directive  Living will  Pre-existing out of facility DNR order (yellow form or pink MOST form)  -  "MOST" Form in Place?  -     Family Communication: Family at bedside Disposition Plan: To be determined  Consultants:  Neurology  Oncology  Time spent: 35 minutes,  including acp time  [employer]

## 2017-12-20 ENCOUNTER — Encounter: Payer: Self-pay | Admitting: Diagnostic Radiology

## 2017-12-20 ENCOUNTER — Inpatient Hospital Stay: Payer: 59

## 2017-12-20 LAB — BASIC METABOLIC PANEL
Anion gap: 12 (ref 5–15)
BUN: 33 mg/dL — ABNORMAL HIGH (ref 6–20)
CHLORIDE: 110 mmol/L (ref 98–111)
CO2: 17 mmol/L — ABNORMAL LOW (ref 22–32)
Calcium: 8 mg/dL — ABNORMAL LOW (ref 8.9–10.3)
Creatinine, Ser: 4.27 mg/dL — ABNORMAL HIGH (ref 0.44–1.00)
GFR calc Af Amer: 12 mL/min — ABNORMAL LOW (ref >60)
GFR calc non Af Amer: 11 mL/min — ABNORMAL LOW (ref >60)
Glucose, Bld: 119 mg/dL — ABNORMAL HIGH (ref 70–99)
Potassium: 3.2 mmol/L — ABNORMAL LOW (ref 3.5–5.1)
SODIUM: 139 mmol/L (ref 135–145)

## 2017-12-20 LAB — HEPARIN LEVEL (UNFRACTIONATED)
HEPARIN UNFRACTIONATED: 0.41 [IU]/mL (ref 0.30–0.70)
HEPARIN UNFRACTIONATED: 0.39 [IU]/mL (ref 0.30–0.70)
Heparin Unfractionated: 0.13 IU/mL — ABNORMAL LOW (ref 0.30–0.70)

## 2017-12-20 LAB — CBC
HCT: 32.3 % — ABNORMAL LOW (ref 35.0–47.0)
HEMOGLOBIN: 11.3 g/dL — AB (ref 12.0–16.0)
MCH: 32.4 pg (ref 26.0–34.0)
MCHC: 34.8 g/dL (ref 32.0–36.0)
MCV: 93 fL (ref 80.0–100.0)
Platelets: 238 10*3/uL (ref 150–440)
RBC: 3.48 MIL/uL — ABNORMAL LOW (ref 3.80–5.20)
RDW: 12.8 % (ref 11.5–14.5)
WBC: 9.4 10*3/uL (ref 3.6–11.0)

## 2017-12-20 MED ORDER — BISACODYL 5 MG PO TBEC: 5.0000 mg | DELAYED_RELEASE_TABLET | Freq: Once | ORAL | Status: DC

## 2017-12-20 MED ORDER — POTASSIUM CHLORIDE 10 MEQ/100ML IV SOLN
10.0000 meq | INTRAVENOUS | Status: DC
  Administered 2017-12-20 (×4): 10 meq via INTRAVENOUS
  Filled 2017-12-20 (×4): qty 100

## 2017-12-20 MED ORDER — HEPARIN BOLUS VIA INFUSION
2100.0000 [IU] | Freq: Once | INTRAVENOUS | Status: AC
  Administered 2017-12-20: 2100 [IU] via INTRAVENOUS
  Filled 2017-12-20: qty 2100

## 2017-12-20 MED ORDER — LACTULOSE 10 GM/15ML PO SOLN: 30.0000 g | Freq: Every day | ORAL | Status: DC | PRN

## 2017-12-20 MED ORDER — BISACODYL 10 MG RE SUPP
10.0000 mg | Freq: Once | RECTAL | Status: AC
  Administered 2017-12-20: 10 mg via RECTAL
  Filled 2017-12-20: qty 1

## 2017-12-20 NOTE — Progress Notes (Addendum)
Hematology/Oncology Progress Note [facility] [phone] [phone]  Patient Care Team: [name], MD as PCP - General (Internal Medicine) Serena Colonel, RN as [place] Management   Name of the patient: Mary [name]  [id number]  11/26/1960  Date of visit: 12/20/17   INTERVAL HISTORY-  No acute overnight events.  S/p percutaneous nephrostomy tubes bilaterally. Creatinine trending down.  Feeling slightly better.  Constipated  Review of Systems  Constitutional: Positive for malaise/fatigue.  HENT: Negative for sore throat.   Eyes: Negative for blurred vision.  Cardiovascular: Negative for chest pain.  Gastrointestinal: Positive for constipation. Negative for nausea and vomiting.  Genitourinary: Positive for urgency.  Skin: Negative for rash.  Psychiatric/Behavioral: Negative for hallucinations.     Allergies  Allergen Reactions  . Adhesive [Tape] Other (See Comments)    Redness:Please use paper tape    Patient Active Problem List   Diagnosis Date Noted  . Edema of left lower extremity   . Metastatic breast cancer ([place])   . Hypercalcemia 12/16/2017  . Malignancy ([place])   . Acute deep vein thrombosis (DVT) of distal vein of left lower extremity ([place])   . Dizziness   . Bilateral posterior neck pain 11/24/2017  . Elevated liver enzymes 11/24/2017  . Tubular adenoma of colon 10/11/2017  . 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 Generalized anxiety disorder 09/19/2017  . Vertigo, labyrinthine, bilateral 08/27/2017  . Prediabetes 08/27/2017  . Encounter for screening colonoscopy 09/21/2014  . Other malaise and fatigue 12/14/2013  . Hot flashes, menopausal 08/08/2012  . Neutropenia, drug-induced ([place]) 12/15/2011  . Primary cancer of upper outer quadrant of left female breast ([place]) 11/17/2011  . Hypertension 09/23/2011  . PLANTAR FASCIITIS, LEFT 12/01/2009  . UNEQUAL LEG LENGTH  12/01/2009     Past Medical History:  Diagnosis Date  . Anxiety    takes Xanax prn  . Breast cancer [facility]) August 2013   bilateral, er/pr+  . Complication of anesthesia    pt states she had the shakes extremely bad  . Cough    at night d/t reflux;nothing productive  . Dental crowns present    x 4  . GERD (gastroesophageal reflux disease)    takes Nexium daily  . History of bronchitis    08/2011  . History of colon polyps   . Hot flashes, menopausal 08/08/2012  . IBS (irritable bowel syndrome)    immodium prn  . Insomnia    takes Ambien nightly  . Irritable bowel syndrome   . Neutropenia, drug-induced ([place]) 12/15/2011  . Nocturia   . Plantar fasciitis   . S/P radiation therapy 07/03/2012 through 08/18/2038                                                        Right chest wall/regional lymph nodes 5040 cGy 28 sessions, right chest wall/mastectomy scar boost 1000 cGy 5 sessions                          . Status post chemotherapy    FEC 100 starting on 12/08/2011 - 01/18/12/single agent Taxol x12 weeks from 02/02/2012 through January 2014   . Urinary frequency   . Use of tamoxifen (Nolvadex)   . Vertigo  Past Surgical History:  Procedure Laterality Date  . BREAST LUMPECTOMY  2008   left  . COLONOSCOPY    . COLONOSCOPY WITH PROPOFOL N/A 10/10/2017   Procedure: COLONOSCOPY WITH PROPOFOL;  Surgeon: Lucilla Lame, MD;  Location: [facility] ENDOSCOPY;  Service: Endoscopy;  Laterality: N/A;  . CYSTOSCOPY WITH STENT PLACEMENT Bilateral 12/17/2017   Procedure: CYSTOSCOPY WITH STENT PLACEMENT;  Surgeon: Hollice Espy, MD;  Location: [facility] ORS;  Service: Urology;  Laterality: Bilateral;  . IR NEPHROSTOMY PLACEMENT LEFT  12/19/2017  . IR NEPHROSTOMY PLACEMENT RIGHT  12/19/2017  . MASTECTOMY    . MASTECTOMY WITH AXILLARY LYMPH NODE DISSECTION Right 05/18/2012   Procedure: MASTECTOMY WITH AXILLARY LYMPH NODE DISSECTION;  Surgeon: Adin Hector, MD;  Location: [place];  Service: General;   Laterality: Right;  Bilateral Total Mastectomy , right axillary lymph node dissection left axillary sentinel node biopsy, removal of Porta Cath  . PORT-A-CATH REMOVAL N/A 05/18/2012   Procedure: REMOVAL PORT-A-CATH;  Surgeon: Adin Hector, MD;  Location: [place];  Service: General;  Laterality: N/A;  . PORTACATH PLACEMENT  12/02/2011   Procedure: INSERTION PORT-A-CATH;  Surgeon: Adin Hector, MD;  Location: [place];  Service: General;  Laterality: N/A;  insertion port a cath with fluoroscopy  . SIMPLE MASTECTOMY WITH AXILLARY SENTINEL NODE BIOPSY Left 05/18/2012   Procedure: SIMPLE MASTECTOMY WITH AXILLARY SENTINEL NODE BIOPSY;  Surgeon: Adin Hector, MD;  Location: [place];  Service: General;  Laterality: Left;    Social History   Socioeconomic History  . Marital status: Married    Spouse name: Not on file  . Number of children: Not on file  . Years of education: Not on file  . Highest education level: Not on file  Occupational History  . Occupation: Network engineer II    Employer: [employer]  Social Needs  . Financial resource strain: Not on file  . Food insecurity:    Worry: Not on file    Inability: Not on file  . Transportation needs:    Medical: Not on file    Non-medical: Not on file  Tobacco Use  . Smoking status: Former Smoker    Packs/day: 0.50    Last attempt to quit: 03/21/1997    Years since quitting: 20.7  . Smokeless tobacco: Never Used  Substance and Sexual Activity  . Alcohol use: Yes    Alcohol/week: 0.0 standard drinks    Comment: occasionally wine  . Drug use: No  . Sexual activity: Yes    Birth control/protection: None  Lifestyle  . Physical activity:    Days per week: Not on file    Minutes per session: Not on file  . Stress: Not on file  Relationships  . Social connections:    Talks on phone: Not on file    Gets together: Not on file    Attends religious service: Not on file    Active member of club or organization: Not on file     Attends meetings of clubs or organizations: Not on file    Relationship status: Not on file  . Intimate partner violence:    Fear of current or ex partner: Not on file    Emotionally abused: Not on file    Physically abused: Not on file    Forced sexual activity: Not on file  Other Topics Concern  . Not on file  Social History Narrative   Married to [employer]  No children 53 years old with fmp.  20 year use of birth control pills.       Family History  Problem Relation Age of Onset  . Hypertension Mother   . Hypertension Maternal Grandmother   . Pancreatic cancer Paternal Grandmother        diagnosed in her 54s  . Ovarian cancer Maternal Aunt        maternal grandmother's sister  . Breast cancer Paternal Aunt        diagnosed in late 75s early 51s  . Parkinsonism Paternal Grandfather      Current Facility-Administered Medications:  .  0.9 %  sodium chloride infusion, , Intravenous, Continuous, Wieting, Richard, MD, Last Rate: 40 mL/hr at 12/19/17 2023 .  acetaminophen (TYLENOL) tablet 650 mg, 650 mg, Oral, Q6H PRN, 650 mg at 12/19/17 1006 **OR** acetaminophen (TYLENOL) suppository 650 mg, 650 mg, Rectal, Q6H PRN, Jodell Cipro, Prasanna, MD .  amLODipine (NORVASC) tablet 5 mg, 5 mg, Oral, Daily, Jodell Cipro, Prasanna, MD, 5 mg at 12/20/17 0849 .  bisacodyl (DULCOLAX) EC tablet 5 mg, 5 mg, Oral, Daily PRN, Arta Silence, MD, 5 mg at 12/19/17 1006 .  diazepam (VALIUM) tablet 10 mg, 10 mg, Oral, QHS PRN, Jodell Cipro, Prasanna, MD .  exemestane (AROMASIN) tablet 25 mg, 25 mg, Oral, QPC breakfast, Jodell Cipro, Prasanna, MD, 25 mg at 12/20/17 0849 .  [COMPLETED] heparin bolus via infusion 4,000 Units, 4,000 Units, Intravenous, Once, 4,000 Units at 12/16/17 1457 **FOLLOWED BY** heparin ADULT infusion 100 units/mL (25000 units/281m sodium chloride 0.45%), 1,400 Units/hr, Intravenous, Continuous, KEpifanio Lesches MD, Last Rate: 14 mL/hr at 12/20/17 0626, 1,400 Units/hr at 12/20/17  0626 .  HYDROmorphone (DILAUDID) injection 0.5-1 mg, 0.5-1 mg, Intravenous, Q3H PRN, SArta Silence MD, 1 mg at 12/20/17 1646 .  Influenza vac split quadrivalent PF (FLUARIX) injection 0.5 mL, 0.5 mL, Intramuscular, Tomorrow-1000, Konidena, Snehalatha, MD .  lactulose (CHRONULAC) 10 GM/15ML solution 30 g, 30 g, Oral, Daily PRN, Wieting, Richard, MD .  ondansetron (ZOFRAN) tablet 4 mg, 4 mg, Oral, Q6H PRN **OR** ondansetron (ZOFRAN) injection 4 mg, 4 mg, Intravenous, Q6H PRN, SJodell Cipro Prasanna, MD, 4 mg at 12/17/17 1148 .  opium-belladonna (B&O SUPPRETTES) 16.2-60 MG suppository 1 suppository, 1 suppository, Rectal, Q8H PRN, BHollice Espy MD .  oxybutynin ([facility] tablet 5 mg, 5 mg, Oral, Q8H PRN, BHollice Espy MD, 5 mg at 12/20/17 0849 .  pantoprazole (PROTONIX) EC tablet 40 mg, 40 mg, Oral, Daily, SJodell Cipro Prasanna, MD, 40 mg at 12/20/17 0849 .  polyethylene glycol (MIRALAX / GLYCOLAX) packet 17 g, 17 g, Oral, q1800, Wieting, Richard, MD, 17 g at 12/19/17 1833 .  scopolamine (TRANSDERM-SCOP) 1 MG/3DAYS 1.5 mg, 1 patch, Transdermal, Q72H, Sridharan, Prasanna, MD, 1.5 mg at 12/19/17 1009 .  senna-docusate (Senokot-S) tablet 1 tablet, 1 tablet, Oral, QHS PRN, SJodell Cipro Prasanna, MD .  traZODone (DESYREL) tablet 25-50 mg, 25-50 mg, Oral, QHS PRN, SArta Silence MD   Physical exam:  Vitals:   12/19/17 1827 12/20/17 0245 12/20/17 0754 12/20/17 1640  BP: (!) 149/80 134/78 (!) 146/82 137/76  Pulse: 99 94 96 (!) 103  Resp:   18 18  Temp: 98.1 F (36.7 C) 98.1 F (36.7 C) (!) 97.5 F (36.4 C) 98.5 F (36.9 C)  TempSrc: Oral Oral Oral Oral  SpO2: 95% 93% 93% 93%  Weight:      Height:      Physical Exam  Constitutional: She is oriented to person, place, and time. No distress.  HENT:  Head: Normocephalic and atraumatic.  Mouth/Throat: Oropharynx is clear and moist.  Eyes: Pupils are equal, round, and reactive to light. EOM are normal. No scleral icterus.  Neck: Normal  range of motion. Neck supple.  Cardiovascular: Normal rate, regular rhythm and normal heart sounds.  No murmur heard. Pulmonary/Chest: Effort normal. No respiratory distress. She has no wheezes.  Diminished right lung base breath sound.  Abdominal: Soft. Bowel sounds are normal. She exhibits distension. She exhibits no mass. There is no tenderness.  Genitourinary:  Genitourinary Comments: Bilateral nephrostomy tubes  Musculoskeletal: Normal range of motion. She exhibits edema. She exhibits no deformity.  Neurological: She is alert and oriented to person, place, and time. No cranial nerve deficit. Coordination normal.  Skin: Skin is warm and dry. No rash noted. No erythema.  Psychiatric: She has a normal mood and affect. Her behavior is normal. Thought content normal.       CMP Latest Ref Rng & Units 12/20/2017  Glucose 70 - 99 mg/dL 119(H)  BUN 6 - 20 mg/dL 33(H)  Creatinine 0.44 - 1.00 mg/dL 4.27(H)  Sodium 135 - 145 mmol/L 139  Potassium 3.5 - 5.1 mmol/L 3.2(L)  Chloride 98 - 111 mmol/L 110  CO2 22 - 32 mmol/L 17(L)  Calcium 8.9 - 10.3 mg/dL 8.0(L)  Total Protein 6.5 - 8.1 g/dL -  Total Bilirubin 0.3 - 1.2 mg/dL -  Alkaline Phos 38 - 126 U/L -  AST 15 - 41 U/L -  ALT 0 - 44 U/L -   CBC Latest Ref Rng & Units 12/20/2017  WBC 3.6 - 11.0 K/uL 9.4  Hemoglobin 12.0 - 16.0 g/dL 11.3(L)  Hematocrit 35.0 - 47.0 % 32.3(L)  Platelets 150 - 440 K/uL 238   RADIOGRAPHIC STUDIES: I have personally reviewed the radiological images as listed and agreed with the findings in the report. Dg Chest 2 View  Result Date: 12/20/2017 CLINICAL DATA:  Shortness of breath. Weakness. EXAM: CHEST - 2 VIEW COMPARISON:  12/02/2011 FINDINGS: The cardiac silhouette is normal in size. Left chest Port-A-Cath has been removed. Surgical clips are present in the axillae. There are new moderate right and small left pleural effusions with atelectasis or consolidation in both lung bases, particularly medially. No  pneumothorax is identified. No acute osseous abnormality is seen. IMPRESSION: New moderate right and small left pleural effusions with bibasilar atelectasis or consolidation. Electronically Signed   By: Logan Bores M.D.   On: 12/20/2017 13:43   Dg Cervical Spine Complete  Result Date: 11/22/2017 CLINICAL DATA:  Neck stiffness for 2 weeks.  Occipital headaches EXAM: CERVICAL SPINE - COMPLETE 4+ VIEW COMPARISON:  None similar FINDINGS: Focal C5-6 disc narrowing with mild endplate ridging and retrolisthesis. No evidence of fracture or bone lesion. No noted facet spurring or erosion. No prevertebral thickening. IMPRESSION: 1. Focal C5-6 disc degeneration. 2. No acute finding. Electronically Signed   By: Monte Fantasia M.D.   On: 11/22/2017 16:02   US Renal  Result Date: 12/18/2017 CLINICAL DATA:  Hydronephrosis aunt LOWER abdominal pain for 2-3 months. History of cystoscopy with bilateral stent placement 12/17/2017. EXAM: RENAL / URINARY TRACT ULTRASOUND COMPLETE COMPARISON:  CT of the abdomen and pelvis on 12/16/2017 FINDINGS: Right Kidney: Length: 12.1 centimeters. There is moderate hydronephrosis. Stent is visualized within the RIGHT renal pelvis. No solid or cystic renal mass. Left Kidney: Length: 12.7 centimeter. There is moderate hydronephrosis. The ureteral stent is not well seen in the LEFT renal pelvis. Bladder: Bladder is decompressed by a Foley catheter. Additional: There are bilateral pleural effusions.  Ascites. IMPRESSION: 1. Moderate bilateral  hydronephrosis. 2. Foley catheter decompresses the bladder. 3. Bilateral pleural effusions.  Ascites. Electronically Signed   By: Nolon Nations M.D.   On: 12/18/2017 16:36   US Venous Img Lower Unilateral Left  Result Date: 12/16/2017 CLINICAL DATA:  Lower extremity edema EXAM: LEFT LOWER EXTREMITY VENOUS DUPLEX ULTRASOUND TECHNIQUE: Doppler venous assessment of the left lower extremity deep venous system was performed, including characterization of  spectral flow, compressibility, and phasicity. COMPARISON:  None. FINDINGS: There is complete compressibility of the left common femoral and femoral veins. The left popliteal vein is partially occlusive and contains partially occlusive DVT. Thrombus is mixed echogenicity with areas of hypoechoic and hyperechoic signal. There is occlusive thrombus in the posterior tibial and peroneal veins. Doppler analysis demonstrates respiratory phasicity and augmentation. IMPRESSION: The study is positive for DVT in the popliteal, posterior tibial, and peroneal veins as described. Critical Value/emergent results were called by telephone at the time of interpretation on 12/16/2017 at 9:24 am to Dr. Alfred Levins, who verbally acknowledged these results. Electronically Signed   By: Marybelle Killings M.D.   On: 12/16/2017 09:25   Ct Renal Stone Study  Result Date: 12/16/2017 CLINICAL DATA:  57 year old female with abdominal pain. History of breast cancer and bilateral mastectomy. Patient is on chemo and radiation treatment. EXAM: CT ABDOMEN AND PELVIS WITHOUT CONTRAST TECHNIQUE: Multidetector CT imaging of the abdomen and pelvis was performed following the standard protocol without IV contrast. COMPARISON:  PET-CT dated 12/01/2011 FINDINGS: Evaluation of this exam is limited in the absence of intravenous contrast. Lower chest: Partially visualized small to moderate bilateral pleural effusions with associated partial compressive atelectasis of the adjacent lungs and areas of linear atelectasis/scarring. No intra-abdominal free air. Diffuse mesenteric edema and stranding and small ascites. Hepatobiliary: Subcentimeter hypodense focus in the right lobe of the liver is not characterized. The liver is otherwise unremarkable on this noncontrast CT. Probable tiny stone or sludge within the gallbladder. Pancreas: Unremarkable. No pancreatic ductal dilatation or surrounding inflammatory changes. Spleen: Normal in size without focal abnormality.  Adrenals/Urinary Tract: Mild bilateral adrenal thickening. There is moderate bilateral hydronephrosis. The ureters are nondistended. The hydronephrosis is likely related to a degree of stricture at the ureteropelvic junctions secondary to retroperitoneal adhesions/fibrosis. No stone identified within the kidneys or along the course of the ureters. There is masslike thickening of the anterior and left lateral bladder wall likely metastatic disease. Stomach/Bowel: There is diffuse thickening of the stomach which may be partly related to underdistention and ascites. Gastritis or an infiltrative process is not entirely excluded. There is no bowel obstruction. The appendix is normal as visualized. Vascular/Lymphatic: Mild aortoiliac atherosclerotic disease. The abdominal aorta and IVC are grossly unremarkable on this noncontrast CT. No portal venous gas. There are multiple somewhat small retroperitoneal and para-aortic lymph nodes. A mildly enlarged right iliac chain node measures 11 mm in short axis. There is diffuse mesenteric and omental nodularity and edema consistent with metastatic implants. Multiple top-normal and mildly rounded mesenteric lymph nodes measure up to 8 mm in short axis. Reproductive: Evaluation of the pelvic structures is very limited. The uterus appears retroflexed. A 3.3 x 3.9 x 2.6 cm ill-defined high attenuating solid lesion to the right of the uterine fundus is not well evaluated and may represent a fibroid or a complex lesion arising from the right adnexa. However, a neoplastic mass is not excluded. Pelvic ultrasound may provide better evaluation. Other: There is mild diffuse subcutaneous edema of the abdominal wall. Musculoskeletal: Multiple small scattered sclerotic lesions throughout  the spine most consistent with metastatic disease. No acute fracture. IMPRESSION: 1. Moderate bilateral hydronephrosis, likely related to strictures at the level of the UPJ's, and likely secondary to  retroperitoneal adhesions or fibrosis. No obstructing stone. 2. Masslike thickening of the anterior and left lateral bladder wall suspicious for metastatic implant. 3. Mesenteric, omental, and peritoneal implants with a small ascites and diffuse mesenteric edema. 4. Small to moderate bilateral pleural effusions with associated compressive atelectasis of the lungs. 5. Diffuse small osseous sclerotic metastatic disease. Electronically Signed   By: Anner Crete M.D.   On: 12/16/2017 06:53   Ir Nephrostomy Placement Left  Result Date: 12/20/2017 INDICATION: Bilateral ureteral obstruction with occluded ureteral stents EXAM: IR NEPHROSTOMY PLACEMENT LEFT COMPARISON:  None. MEDICATIONS: 2 g IV Ancef; The antibiotic was administered in an appropriate time frame prior to skin puncture. ANESTHESIA/SEDATION: Fentanyl 75 mcg IV; Versed 1 mg IV Moderate Sedation Time:  35 The patient was continuously monitored during the procedure by the interventional radiology nurse under my direct supervision. CONTRAST:  25m ISOVUE-300 IOPAMIDOL (ISOVUE-300) INJECTION 61% - administered into the collecting system(s) FLUOROSCOPY TIME:  Fluoroscopy Time: 5 minutes 48 seconds (356 mGy). COMPLICATIONS: None immediate. PROCEDURE: Informed written consent was obtained from the patient after a thorough discussion of the procedural risks, benefits and alternatives. All questions were addressed. Maximal Sterile Barrier Technique was utilized including caps, mask, sterile gowns, sterile gloves, sterile drape, hand hygiene and skin antiseptic. A timeout was performed prior to the initiation of the procedure. Utilizing 1% lidocaine as a local and deep anesthetic and fluoroscopic guidance, a 21 gauge needle was utilized to puncture the collecting system in the region of the left renal pelvis guided by a previously placed stent. Brown colored urine likely related to prior hemorrhage was withdrawn from the needle and contrast injection confirmed  location within the collecting system. Subsequently a small amount of room air was injected to allow for delineation of posterior caliceal system. A lower pole posterior calyx was identified and again utilizing 1% xylocaine as local anesthetic and fluoroscopic guidance, a 20 gauge diamond tip needle was passed into the posterior calyx. Both air and urine were withdrawn from the needle and subsequently a 0.018 platinum tip guidewire was advanced into the collecting system. Over this, a 6 [place] dilator was placed into the collecting system and a J wire coiled within the renal pelvis. The axis sick dilator was then removed and the tract dilated to 8 [place] The dilator was then removed and a 8 French pigtail catheter was placed over the wire and coiled within the renal pelvis. The catheter was initially placed adjacent to the body of the stent although no significant drainage of urine was noted. Contrast material was injected which demonstrates a significant amount of thrombus within the collecting system and for this reason the catheter was advanced into the superior aspect of the renal pelvis where adequate urine drainage was noted. The catheter was then sutured into place utilizing 0 Prolene and a Hopkins buttress. Catheter was placed external gravity drain and bloody urine was obtained. The patient tolerated the catheter placement well and attention was then turned to the right collecting system. IMPRESSION: Successful placement of an 8 French pigtail catheter within the left renal collecting system. Thrombus is noted within the collecting system which may limit drainage somewhat. This should resolve over time. Electronically Signed   By: MInez CatalinaM.D.   On: 12/20/2017 08:02   Ir Nephrostomy Placement Right  Result Date:  12/20/2017 INDICATION: Ureteral occlusion with occluded ureteral stents EXAM: IR NEPHROSTOMY PLACEMENT RIGHT COMPARISON:  None. MEDICATIONS: 2 g IV Ancef; The antibiotic was  administered in an appropriate time frame prior to skin puncture. ANESTHESIA/SEDATION: Fentanyl 75 mcg IV; Versed 1 mg IV Moderate Sedation Time:  35 The patient was continuously monitored during the procedure by the interventional radiology nurse under my direct supervision. CONTRAST:  20m ISOVUE-300 IOPAMIDOL (ISOVUE-300) INJECTION 61% - administered into the collecting system(s) FLUOROSCOPY TIME:  Fluoroscopy Time: 5 minutes 48 seconds (356 mGy). COMPLICATIONS: None immediate. PROCEDURE: Informed written consent was obtained from the patient after a thorough discussion of the procedural risks, benefits and alternatives. All questions were addressed. Maximal Sterile Barrier Technique was utilized including caps, mask, sterile gowns, sterile gloves, sterile drape, hand hygiene and skin antiseptic. A timeout was performed prior to the initiation of the procedure. Utilizing 1% xylocaine as local anesthetic and fluoroscopic guidance a 21 gauge Chiba needle was placed into the right collecting system utilizing the pigtail from the previously placed ureteral stent as a guide. Brown tinged urine was withdrawn from the needle and subsequently a small amount of contrast was instilled to confirm needle placement. This demonstrates dilated collecting system. Subsequently a small amount of room air was instilled to allow for delineation of a posterior calyx. Again utilizing 1% xylocaine as local anesthetic and fluoroscopic guidance, a 20 gauge diamond tip needle was placed into the upper pole posterior calyx without difficulty. A 0.018 platinum tip guidewire was then advanced into the collecting system and coiled within the renal pelvis. A 6 French Accustick dilator was then placed over the microwire into the collecting system. The central dilator in stiff near were then removed and a J tipped guidewire was coiled within the renal pelvis. Dilatation to 10 [place] then performed and a 10 French nephrostomy catheter placed  over the guidewire into the renal pelvis. Brisk return of bloody urine was then noted. The catheter was then attached to an external gravity bag. The catheter was sutured into place utilizing 0 Prolene and a Hopkins buttress. Patient tolerated the procedure well and was returned to her room in satisfactory condition. IMPRESSION: Successful right percutaneous nephrostomy placement as described above. Bloody urine was noted at the termination of the procedure which should clear over time. Electronically Signed   By: MInez CatalinaM.D.   On: 12/20/2017 08:06    Assessment and plan-  Patient is a 57y.o. female  female with history of Stage III right breast cancer, ER/PR positive and HER2 negative, s/p chemotherapy, B/L mastectomy, adjuvant RT and on extended Aromasin and Zolodex presents with abdominal pain  #Acute renal failure, Creatinine slightly trended down. Continue gentle hydration.  S/p bilateral nephrostomy tubes.   # Hypercalcemia, likely due to metastatic cancer, she already received calcitonin 4 units/kg x 1, about to start Zometa 4 mg x 1 Calcium level has normalized.   #Left lower extremity below-knee acute DVT, in the context of widely metastatic malignancy, continue heparin gtt for now as she may need additional procedures. Switch to eliquis at discharge.   #Stage IV metastatic breast cancer, with visceral crisis.  Recommend starting palliative chemotherapy with Taxol. Will discuss with patient for getting first dose of Taxol treatment inpatient. She can follow up outpatient and get Medi port placed, chemotherapy class after discharge.   #Onoging Dizziness/lentigo since April 2019.  Patient has had a negative MRI of brain in April 2019.  repeat MRI brain. If her kidney level does  not improved to a point safe for MRI in the next few days, will do MRI outpatient. Will not hold chemotherapy for MRI brain.   # Diminished right base breath sounds, obtain chest X ray. Xray was  independently reviewed by me which showed bialteral pleural effusion, R>L, present on recent CT as well.  she denies SOB currently. Continue to monitor.    Thank you for allowing me to participate in the care of this patient.   Earlie Server, MD, PhD Hematology Oncology [facility] Pager- [phone] 12/20/2017

## 2017-12-20 NOTE — Progress Notes (Signed)
Urology Consult Follow Up  Subjective: Patient resting in bed.  Sister-in-law, Sarah, at bedside.  Creatinine 4.27.  UOP good from nephrostomy.  Scant output from Foley.  Foley to be discontinued.       Anti-infectives: Anti-infectives (From admission, onward)   Start     Dose/Rate Route Frequency Ordered Stop   12/19/17 1645  ceFAZolin (ANCEF) IVPB 1 g/50 mL premix     1 g 100 mL/hr over 30 Minutes Intravenous On call 12/19/17 1636 12/19/17 1725   12/17/17 0600  ceFAZolin (ANCEF) IVPB 2g/100 mL premix     2 g 200 mL/hr over 30 Minutes Intravenous  Once 12/16/17 1516 12/17/17 0605      Current Facility-Administered Medications  Medication Dose Route Frequency Provider Last Rate Last Dose  . 0.9 %  sodium chloride infusion   Intravenous Continuous Loletha Grayer, MD 40 mL/hr at 12/19/17 2023    . acetaminophen (TYLENOL) tablet 650 mg  650 mg Oral Q6H PRN Arta Silence, MD   650 mg at 12/19/17 1006   Or  . acetaminophen (TYLENOL) suppository 650 mg  650 mg Rectal Q6H PRN Arta Silence, MD      . amLODipine (NORVASC) tablet 5 mg  5 mg Oral Daily Arta Silence, MD   5 mg at 12/19/17 1006  . bisacodyl (DULCOLAX) EC tablet 5 mg  5 mg Oral Daily PRN Arta Silence, MD   5 mg at 12/19/17 1006  . diazepam (VALIUM) tablet 10 mg  10 mg Oral QHS PRN Arta Silence, MD      . exemestane (AROMASIN) tablet 25 mg  25 mg Oral QPC breakfast Arta Silence, MD   25 mg at 12/19/17 1006  . heparin ADULT infusion 100 units/mL (25000 units/264mL sodium chloride 0.45%)  1,400 Units/hr Intravenous Continuous Epifanio Lesches, MD 14 mL/hr at 12/20/17 0626 1,400 Units/hr at 12/20/17 0626  . HYDROmorphone (DILAUDID) injection 0.5-1 mg  0.5-1 mg Intravenous Q3H PRN Arta Silence, MD   1 mg at 12/20/17 0443  . Influenza vac split quadrivalent PF (FLUARIX) injection 0.5 mL  0.5 mL Intramuscular Tomorrow-1000 Epifanio Lesches, MD      . ondansetron (ZOFRAN) tablet 4  mg  4 mg Oral Q6H PRN Arta Silence, MD       Or  . ondansetron (ZOFRAN) injection 4 mg  4 mg Intravenous Q6H PRN Arta Silence, MD   4 mg at 12/17/17 1148  . opium-belladonna (B&O SUPPRETTES) 16.2-60 MG suppository 1 suppository  1 suppository Rectal Q8H PRN Hollice Espy, MD      . oxybutynin (DITROPAN) tablet 5 mg  5 mg Oral Q8H PRN Hollice Espy, MD   5 mg at 12/19/17 1833  . pantoprazole (PROTONIX) EC tablet 40 mg  40 mg Oral Daily Arta Silence, MD   40 mg at 12/19/17 1006  . polyethylene glycol (MIRALAX / GLYCOLAX) packet 17 g  17 g Oral q1800 Loletha Grayer, MD   17 g at 12/19/17 1833  . scopolamine (TRANSDERM-SCOP) 1 MG/3DAYS 1.5 mg  1 patch Transdermal Q72H Arta Silence, MD   1.5 mg at 12/19/17 1009  . senna-docusate (Senokot-S) tablet 1 tablet  1 tablet Oral QHS PRN Arta Silence, MD      . traZODone (DESYREL) tablet 25-50 mg  25-50 mg Oral QHS PRN Arta Silence, MD         Objective: Vital signs in last 24 hours: Temp:  [97.5 F (36.4 C)-98.6 F (37 C)] 97.5 F (36.4 C) (10/02 0754) Pulse Rate:  [78-105]  96 (10/02 0754) Resp:  [15-18] 18 (10/02 0754) BP: (134-181)/(78-95) 146/82 (10/02 0754) SpO2:  [92 %-100 %] 93 % (10/02 0754)  Intake/Output from previous day: 10/01 0701 - 10/02 0700 In: 2074.7 [I.V.:2074.7] Out: 2150 [Urine:2150] Intake/Output this shift: No intake/output data recorded.   Physical Exam Constitutional: Well nourished. Alert and oriented, No acute distress. HEENT: [place] AT, moist mucus membranes. Trachea midline, no masses. Cardiovascular: No clubbing, cyanosis, or edema. Respiratory: Normal respiratory effort, no increased work of breathing. GI: Abdomen is soft, non tender, non distended, no abdominal masses.  GU: No CVA tenderness.  No bladder fullness or masses.  Nephrostomy tubes in place.  Dressing is clean and dry.  Right nephrostomy with red clear urine.  Left nephrostomy with tea colored urine.   Foley in place, scant urine.  Skin: No rashes, bruises or suspicious lesions. Lymph: No cervical or inguinal adenopathy. Neurologic: Grossly intact, no focal deficits, moving all 4 extremities. Psychiatric: Normal mood and affect.  Lab Results:  Recent Labs    12/19/17 0620 12/20/17 0415  WBC 9.2 9.4  HGB 10.8* 11.3*  HCT 31.4* 32.3*  PLT 217 238   BMET Recent Labs    12/19/17 0620 12/20/17 0415  NA 137 139  K 3.2* 3.2*  CL 109 110  CO2 20* 17*  GLUCOSE 133* 119*  BUN 30* 33*  CREATININE 4.78* 4.27*  CALCIUM 8.3* 8.0*   PT/INR No results for input(s): LABPROT, INR in the last 72 hours. ABG No results for input(s): PHART, HCO3 in the last 72 hours.  Invalid input(s): PCO2, PO2  Studies/Results: US Renal  Result Date: 12/18/2017 CLINICAL DATA:  Hydronephrosis aunt LOWER abdominal pain for 2-3 months. History of cystoscopy with bilateral stent placement 12/17/2017. EXAM: RENAL / URINARY TRACT ULTRASOUND COMPLETE COMPARISON:  CT of the abdomen and pelvis on 12/16/2017 FINDINGS: Right Kidney: Length: 12.1 centimeters. There is moderate hydronephrosis. Stent is visualized within the RIGHT renal pelvis. No solid or cystic renal mass. Left Kidney: Length: 12.7 centimeter. There is moderate hydronephrosis. The ureteral stent is not well seen in the LEFT renal pelvis. Bladder: Bladder is decompressed by a Foley catheter. Additional: There are bilateral pleural effusions.  Ascites. IMPRESSION: 1. Moderate bilateral hydronephrosis. 2. Foley catheter decompresses the bladder. 3. Bilateral pleural effusions.  Ascites. Electronically Signed   By: Nolon Nations M.D.   On: 12/18/2017 16:36   Ir Nephrostomy Placement Left  Result Date: 12/20/2017 INDICATION: Bilateral ureteral obstruction with occluded ureteral stents EXAM: IR NEPHROSTOMY PLACEMENT LEFT COMPARISON:  None. MEDICATIONS: 2 g IV Ancef; The antibiotic was administered in an appropriate time frame prior to skin puncture.  ANESTHESIA/SEDATION: Fentanyl 75 mcg IV; Versed 1 mg IV Moderate Sedation Time:  35 The patient was continuously monitored during the procedure by the interventional radiology nurse under my direct supervision. CONTRAST:  71mL ISOVUE-300 IOPAMIDOL (ISOVUE-300) INJECTION 61% - administered into the collecting system(s) FLUOROSCOPY TIME:  Fluoroscopy Time: 5 minutes 48 seconds (356 mGy). COMPLICATIONS: None immediate. PROCEDURE: Informed written consent was obtained from the patient after a thorough discussion of the procedural risks, benefits and alternatives. All questions were addressed. Maximal Sterile Barrier Technique was utilized including caps, mask, sterile gowns, sterile gloves, sterile drape, hand hygiene and skin antiseptic. A timeout was performed prior to the initiation of the procedure. Utilizing 1% lidocaine as a local and deep anesthetic and fluoroscopic guidance, a 21 gauge needle was utilized to puncture the collecting system in the region of the left renal pelvis guided by a  previously placed stent. Brown colored urine likely related to prior hemorrhage was withdrawn from the needle and contrast injection confirmed location within the collecting system. Subsequently a small amount of room air was injected to allow for delineation of posterior caliceal system. A lower pole posterior calyx was identified and again utilizing 1% xylocaine as local anesthetic and fluoroscopic guidance, a 20 gauge diamond tip needle was passed into the posterior calyx. Both air and urine were withdrawn from the needle and subsequently a 0.018 platinum tip guidewire was advanced into the collecting system. Over this, a 6 [place] Accustick dilator was placed into the collecting system and a J wire coiled within the renal pelvis. The axis sick dilator was then removed and the tract dilated to 8 [place]. The dilator was then removed and a 8 French pigtail catheter was placed over the wire and coiled within the renal pelvis.  The catheter was initially placed adjacent to the body of the stent although no significant drainage of urine was noted. Contrast material was injected which demonstrates a significant amount of thrombus within the collecting system and for this reason the catheter was advanced into the superior aspect of the renal pelvis where adequate urine drainage was noted. The catheter was then sutured into place utilizing 0 Prolene and a Hopkins buttress. Catheter was placed external gravity drain and bloody urine was obtained. The patient tolerated the catheter placement well and attention was then turned to the right collecting system. IMPRESSION: Successful placement of an 8 French pigtail catheter within the left renal collecting system. Thrombus is noted within the collecting system which may limit drainage somewhat. This should resolve over time. Electronically Signed   By: Inez Catalina M.D.   On: 12/20/2017 08:02   Ir Nephrostomy Placement Right  Result Date: 12/20/2017 INDICATION: Ureteral occlusion with occluded ureteral stents EXAM: IR NEPHROSTOMY PLACEMENT RIGHT COMPARISON:  None. MEDICATIONS: 2 g IV Ancef; The antibiotic was administered in an appropriate time frame prior to skin puncture. ANESTHESIA/SEDATION: Fentanyl 75 mcg IV; Versed 1 mg IV Moderate Sedation Time:  35 The patient was continuously monitored during the procedure by the interventional radiology nurse under my direct supervision. CONTRAST:  84mL ISOVUE-300 IOPAMIDOL (ISOVUE-300) INJECTION 61% - administered into the collecting system(s) FLUOROSCOPY TIME:  Fluoroscopy Time: 5 minutes 48 seconds (356 mGy). COMPLICATIONS: None immediate. PROCEDURE: Informed written consent was obtained from the patient after a thorough discussion of the procedural risks, benefits and alternatives. All questions were addressed. Maximal Sterile Barrier Technique was utilized including caps, mask, sterile gowns, sterile gloves, sterile drape, hand hygiene and skin  antiseptic. A timeout was performed prior to the initiation of the procedure. Utilizing 1% xylocaine as local anesthetic and fluoroscopic guidance a 21 gauge Chiba needle was placed into the right collecting system utilizing the pigtail from the previously placed ureteral stent as a guide. Brown tinged urine was withdrawn from the needle and subsequently a small amount of contrast was instilled to confirm needle placement. This demonstrates dilated collecting system. Subsequently a small amount of room air was instilled to allow for delineation of a posterior calyx. Again utilizing 1% xylocaine as local anesthetic and fluoroscopic guidance, a 20 gauge diamond tip needle was placed into the upper pole posterior calyx without difficulty. A 0.018 platinum tip guidewire was then advanced into the collecting system and coiled within the renal pelvis. A 6 French Accustick dilator was then placed over the microwire into the collecting system. The central dilator in stiff near were then removed  and a J tipped guidewire was coiled within the renal pelvis. Dilatation to 10 [place] was then performed and a 10 French nephrostomy catheter placed over the guidewire into the renal pelvis. Brisk return of bloody urine was then noted. The catheter was then attached to an external gravity bag. The catheter was sutured into place utilizing 0 Prolene and a Hopkins buttress. Patient tolerated the procedure well and was returned to her room in satisfactory condition. IMPRESSION: Successful right percutaneous nephrostomy placement as described above. Bloody urine was noted at the termination of the procedure which should clear over time. Electronically Signed   By: Inez Catalina M.D.   On: 12/20/2017 08:06     Assessment and Plan: Patient with stage III breast cancer that is now diffusely metastasized involving retroperitoneum, omentum, bone, possibly liver and possible bladder with bilateral ureteral obstruction  1.  Bilateral  hydroureteronephrosis S/p bilateral stent placement with bladder biopsies on 12/17/2017  Patient underwent bilateral nephrostomy tube placement 12/19/2017 Bladder biopsies consistent with metastatic breast cancer UOP with the nephrostomy tubes is good, scant UOP from Foley - Foley will be removed Continue to trend creatinine       LOS: 4 days    [facility] 12/20/2017

## 2017-12-20 NOTE — Consult Note (Signed)
[facility] CONSULT NOTE    Date: 12/20/2017                  Patient Name:  Mary [name]  MRN: [id number]  DOB: 1961-03-20  Age / Sex: 57 y.o., female         PCP: [name], MD                 Service Requesting Consult: Hospitalist                 Reason for Consult: Acute renal failure due to obstructive uropathy.             History of Present Illness: Patient is a 57 y.o. female with a PMHx of breast cancer status post bilateral mastectomy with chemotherapy and radiation therapy, anxiety, GERD, irritable bowel syndrome, history of neutropenia, plantar fasciitis, who was admitted to [facility] on 12/16/2017 for evaluation of abnormal lab values including hypercalcemia as well as acute renal failure.  She is now found to have a bladder mass and bilateral hydronephrosis.  She underwent bilateral ureteral stent placement.  However despite this renal function worsened.  She is now status post bilateral nephrostomy placement and appears to have good urine output.  Creatinine has started to come down with bilateral nephrostomy placement.  There is significant blood in the nephrostomy bags.  She has diminished appetite.   Medications: Outpatient medications: Medications Prior to Admission  Medication Sig Dispense Refill Last Dose  . amLODipine (NORVASC) 5 MG tablet TAKE 1 TABLET BY MOUTH DAILY. 90 tablet 1 Past Week at Unknown time  . Ascorbic Acid (VITAMIN C) 100 MG tablet Take 100 mg by mouth daily.     Past Week at Unknown time  . Calcium Carbonate-Vitamin D (CALTRATE 600+D PO) Take 1 tablet by mouth daily.   Past Week at Unknown time  . diazepam (VALIUM) 10 MG tablet Take 1 tablet (10 mg total) by mouth at bedtime as needed for anxiety. 30 tablet 5 prn at prn  . esomeprazole (NEXIUM) 40 MG capsule Take 40 mg by mouth daily before breakfast.   Past Week at Unknown time  . exemestane (AROMASIN) 25 MG tablet TAKE 1 TABLET BY MOUTH ONCE DAILY AFTER BREAKFAST 90  tablet 3 Past Week at Unknown time  . goserelin (ZOLADEX) 3.6 MG injection Inject 3.6 mg into the skin every 28 (twenty-eight) days.   Past Week at Unknown time  . loperamide (IMODIUM A-D) 2 MG tablet Take 2 mg by mouth 4 (four) times daily as needed for diarrhea or loose stools.    Past Week at Unknown time  . losartan-hydrochlorothiazide (HYZAAR) 50-12.5 MG tablet Take 1 tablet by mouth daily. 90 tablet 0 Past Week at Unknown time  . methocarbamol (ROBAXIN) 500 MG tablet Take 1 tablet (500 mg total) by mouth 4 (four) times daily. 90 tablet 2 Past Week at Unknown time  . Multiple Vitamin (MULTIVITAMIN) tablet Take 1 tablet by mouth daily.     Past Week at Unknown time  . ondansetron (ZOFRAN) 4 MG tablet Take 1 tablet (4 mg total) by mouth every 8 (eight) hours as needed for nausea or vomiting. 20 tablet 0 Past Month at Unknown time  . traZODone (DESYREL) 50 MG tablet Take 0.5-1 tablets (25-50 mg total) by mouth at bedtime as needed for sleep. 90 tablet 1 Past Week at Unknown time  . predniSONE (DELTASONE) 10 MG tablet 6 tablets daily for 3 days, then reduce  by 1 tablet daily until gone (Patient not taking: Reported on 12/16/2017) 21 tablet 0 Completed Course at Unknown time    Current medications: Current Facility-Administered Medications  Medication Dose Route Frequency Provider Last Rate Last Dose  . 0.9 %  sodium chloride infusion   Intravenous Continuous Loletha Grayer, MD 40 mL/hr at 12/19/17 2023    . acetaminophen (TYLENOL) tablet 650 mg  650 mg Oral Q6H PRN Arta Silence, MD   650 mg at 12/19/17 1006   Or  . acetaminophen (TYLENOL) suppository 650 mg  650 mg Rectal Q6H PRN Arta Silence, MD      . amLODipine (NORVASC) tablet 5 mg  5 mg Oral Daily Arta Silence, MD   5 mg at 12/20/17 0849  . bisacodyl (DULCOLAX) EC tablet 5 mg  5 mg Oral Daily PRN Arta Silence, MD   5 mg at 12/19/17 1006  . diazepam (VALIUM) tablet 10 mg  10 mg Oral QHS PRN Arta Silence,  MD      . exemestane (AROMASIN) tablet 25 mg  25 mg Oral QPC breakfast Arta Silence, MD   25 mg at 12/20/17 0849  . heparin ADULT infusion 100 units/mL (25000 units/248mL sodium chloride 0.45%)  1,400 Units/hr Intravenous Continuous Epifanio Lesches, MD 14 mL/hr at 12/20/17 0626 1,400 Units/hr at 12/20/17 0626  . HYDROmorphone (DILAUDID) injection 0.5-1 mg  0.5-1 mg Intravenous Q3H PRN Arta Silence, MD   1 mg at 12/20/17 0443  . Influenza vac split quadrivalent PF (FLUARIX) injection 0.5 mL  0.5 mL Intramuscular Tomorrow-1000 Epifanio Lesches, MD      . ondansetron (ZOFRAN) tablet 4 mg  4 mg Oral Q6H PRN Arta Silence, MD       Or  . ondansetron (ZOFRAN) injection 4 mg  4 mg Intravenous Q6H PRN Arta Silence, MD   4 mg at 12/17/17 1148  . opium-belladonna (B&O SUPPRETTES) 16.2-60 MG suppository 1 suppository  1 suppository Rectal Q8H PRN Hollice Espy, MD      . oxybutynin (DITROPAN) tablet 5 mg  5 mg Oral Q8H PRN Hollice Espy, MD   5 mg at 12/20/17 0849  . pantoprazole (PROTONIX) EC tablet 40 mg  40 mg Oral Daily Arta Silence, MD   40 mg at 12/20/17 0849  . polyethylene glycol (MIRALAX / GLYCOLAX) packet 17 g  17 g Oral q1800 Loletha Grayer, MD   17 g at 12/19/17 1833  . scopolamine (TRANSDERM-SCOP) 1 MG/3DAYS 1.5 mg  1 patch Transdermal Q72H Arta Silence, MD   1.5 mg at 12/19/17 1009  . senna-docusate (Senokot-S) tablet 1 tablet  1 tablet Oral QHS PRN Arta Silence, MD      . traZODone (DESYREL) tablet 25-50 mg  25-50 mg Oral QHS PRN Arta Silence, MD          Allergies: Allergies  Allergen Reactions  . Adhesive [Tape] Other (See Comments)    Redness:Please use paper tape      Past Medical History: Past Medical History:  Diagnosis Date  . Anxiety    takes Xanax prn  . Breast cancer [facility]) August 2013   bilateral, er/pr+  . Complication of anesthesia    pt states she had the shakes extremely bad  . Cough    at  night d/t reflux;nothing productive  . Dental crowns present    x 4  . GERD (gastroesophageal reflux disease)    takes Nexium daily  . History of bronchitis    08/2011  . History of colon polyps   .  Hot flashes, menopausal 08/08/2012  . IBS (irritable bowel syndrome)    immodium prn  . Insomnia    takes Ambien nightly  . Irritable bowel syndrome   . Neutropenia, drug-induced ([place]) 12/15/2011  . Nocturia   . Plantar fasciitis   . S/P radiation therapy 07/03/2012 through 08/18/2038                                                        Right chest wall/regional lymph nodes 5040 cGy 28 sessions, right chest wall/mastectomy scar boost 1000 cGy 5 sessions                          . Status post chemotherapy    FEC 100 starting on 12/08/2011 - 01/18/12/single agent Taxol x12 weeks from 02/02/2012 through January 2014   . Urinary frequency   . Use of tamoxifen (Nolvadex)   . Vertigo      Past Surgical History: Past Surgical History:  Procedure Laterality Date  . BREAST LUMPECTOMY  2008   left  . COLONOSCOPY    . COLONOSCOPY WITH PROPOFOL N/A 10/10/2017   Procedure: COLONOSCOPY WITH PROPOFOL;  Surgeon: Lucilla Lame, MD;  Location: [facility] ENDOSCOPY;  Service: Endoscopy;  Laterality: N/A;  . CYSTOSCOPY WITH STENT PLACEMENT Bilateral 12/17/2017   Procedure: CYSTOSCOPY WITH STENT PLACEMENT;  Surgeon: Hollice Espy, MD;  Location: [facility] ORS;  Service: Urology;  Laterality: Bilateral;  . IR NEPHROSTOMY PLACEMENT LEFT  12/19/2017  . IR NEPHROSTOMY PLACEMENT RIGHT  12/19/2017  . MASTECTOMY    . MASTECTOMY WITH AXILLARY LYMPH NODE DISSECTION Right 05/18/2012   Procedure: MASTECTOMY WITH AXILLARY LYMPH NODE DISSECTION;  Surgeon: Adin Hector, MD;  Location: [place];  Service: General;  Laterality: Right;  Bilateral Total Mastectomy , right axillary lymph node dissection left axillary sentinel node biopsy, removal of Porta Cath  . PORT-A-CATH REMOVAL N/A 05/18/2012   Procedure: REMOVAL PORT-A-CATH;   Surgeon: Adin Hector, MD;  Location: [place];  Service: General;  Laterality: N/A;  . PORTACATH PLACEMENT  12/02/2011   Procedure: INSERTION PORT-A-CATH;  Surgeon: Adin Hector, MD;  Location: [place];  Service: General;  Laterality: N/A;  insertion port a cath with fluoroscopy  . SIMPLE MASTECTOMY WITH AXILLARY SENTINEL NODE BIOPSY Left 05/18/2012   Procedure: SIMPLE MASTECTOMY WITH AXILLARY SENTINEL NODE BIOPSY;  Surgeon: Adin Hector, MD;  Location: [place];  Service: General;  Laterality: Left;     Family History: Family History  Problem Relation Age of Onset  . Hypertension Mother   . Hypertension Maternal Grandmother   . Pancreatic cancer Paternal Grandmother        diagnosed in her 92s  . Ovarian cancer Maternal Aunt        maternal grandmother's sister  . Breast cancer Paternal Aunt        diagnosed in late 63s early 71s  . Parkinsonism Paternal Grandfather      Social History: Social History   Socioeconomic History  . Marital status: Married    Spouse name: Not on file  . Number of children: Not on file  . Years of education: Not on file  . Highest education level: Not on file  Occupational History  . Occupation: Network engineer II    Employer: [employer]  Social Needs  . Financial resource strain: Not on file  . Food insecurity:    Worry: Not on file    Inability: Not on file  . Transportation needs:    Medical: Not on file    Non-medical: Not on file  Tobacco Use  . Smoking status: Former Smoker    Packs/day: 0.50    Last attempt to quit: 03/21/1997    Years since quitting: 20.7  . Smokeless tobacco: Never Used  Substance and Sexual Activity  . Alcohol use: Yes    Alcohol/week: 0.0 standard drinks    Comment: occasionally wine  . Drug use: No  . Sexual activity: Yes    Birth control/protection: None  Lifestyle  . Physical activity:    Days per week: Not on file    Minutes per session: Not on file  . Stress: Not on file   Relationships  . Social connections:    Talks on phone: Not on file    Gets together: Not on file    Attends religious service: Not on file    Active member of club or organization: Not on file    Attends meetings of clubs or organizations: Not on file    Relationship status: Not on file  . Intimate partner violence:    Fear of current or ex partner: Not on file    Emotionally abused: Not on file    Physically abused: Not on file    Forced sexual activity: Not on file  Other Topics Concern  . Not on file  Social History Narrative   Married to [employer]  No children 22 years old with fmp.  20 year use of birth control pills.       Review of Systems: Review of Systems  Constitutional: Positive for malaise/fatigue. Negative for chills and fever.  HENT: Negative for hearing loss and tinnitus.   Eyes: Negative for blurred vision and double vision.  Respiratory: Negative for cough, hemoptysis and sputum production.   Cardiovascular: Negative for chest pain, palpitations and orthopnea.  Gastrointestinal: Negative for heartburn, nausea and vomiting.  Genitourinary: Negative for dysuria and urgency.  Musculoskeletal: Positive for back pain. Negative for myalgias.  Skin: Negative for itching and rash.  Neurological: Positive for dizziness.  Endo/Heme/Allergies: Negative for polydipsia. Does not bruise/bleed easily.  Psychiatric/Behavioral: Negative for depression. The patient is not nervous/anxious.      Vital Signs: Blood pressure (!) 146/82, pulse 96, temperature (!) 97.5 F (36.4 C), temperature source Oral, resp. rate 18, height 5\' 4"  (1.626 m), weight 72.5 kg, last menstrual period 11/28/2011, SpO2 93 %.  Weight trends: Filed Weights   12/16/17 0318 12/16/17 1225  Weight: 72.6 kg 72.5 kg    Physical Exam: General: NAD, resting in bed  Head: Normocephalic, atraumatic.  Eyes: Anicteric, EOMI  Nose: Mucous membranes moist, not inflammed, nonerythematous.  Throat:  Oropharynx nonerythematous, no exudate appreciated.   Neck: Supple, trachea midline.  Lungs:  Normal respiratory effort. Clear to auscultation BL without crackles or wheezes.  Heart: RRR. S1 and S2 normal without gallop, murmur, or rubs.  Abdomen:  Soft NTND BS present, bilateral nephrostomies in place  Extremities: No pretibial edema.  Neurologic: A&O X3, Motor strength is 5/5 in the all 4 extremities  Skin: No visible rashes, scars.    Lab results: Basic Metabolic Panel: Recent Labs  Lab 12/16/17 0332  12/18/17 0649 12/19/17 0620 12/20/17 0415  NA 139   < > 141 137 139  K 3.0*   < >  3.3* 3.2* 3.2*  CL 102   < > 109 109 110  CO2 28   < > 23 20* 17*  GLUCOSE 113*   < > 113* 133* 119*  BUN 30*   < > 22* 30* 33*  CREATININE 1.63*   < > 2.31* 4.78* 4.27*  CALCIUM 13.9*   < > 9.6 8.3* 8.0*  MG 2.1  --   --   --   --   PHOS 3.1  --   --   --   --    < > = values in this interval not displayed.    Liver Function Tests: Recent Labs  Lab 12/15/17 1525 12/16/17 0332  AST 36* 39  ALT 37* 43  ALKPHOS  --  110  BILITOT 0.3 0.6  PROT 6.4 6.9  ALBUMIN  --  3.8   No results for input(s): LIPASE, AMYLASE in the last 168 hours. No results for input(s): AMMONIA in the last 168 hours.  CBC: Recent Labs  Lab 12/16/17 0332 12/17/17 0458 12/18/17 0649 12/19/17 0620 12/20/17 0415  WBC 7.6 7.0 10.3 9.2 9.4  HGB 13.0 11.8* 11.7* 10.8* 11.3*  HCT 37.5 33.4* 33.8* 31.4* 32.3*  MCV 91.3 91.5 93.2 93.0 93.0  PLT 201 205 230 217 238    Cardiac Enzymes: No results for input(s): CKTOTAL, CKMB, CKMBINDEX, TROPONINI in the last 168 hours.  BNP: Invalid input(s): POCBNP  CBG: No results for input(s): GLUCAP in the last 168 hours.  Microbiology: Results for orders placed or performed in visit on 11/10/15  CULTURE, URINE COMPREHENSIVE     Status: None   Collection Time: 11/10/15  2:41 PM  Result Value Ref Range Status   Culture ESCHERICHIA COLI  Final   Colony Count >=100,000  COLONIES/ML  Final   Organism ID, Bacteria ESCHERICHIA COLI  Final      Susceptibility   Escherichia coli -  (no method available)    AMPICILLIN 4 Sensitive     AMOX/CLAVULANIC <=2 Sensitive     AMPICILLIN/SULBACTAM <=2 Sensitive     PIP/TAZO <=4 Sensitive     IMIPENEM <=0.25 Sensitive     CEFAZOLIN  Sensitive     CEFTRIAXONE <=1 Sensitive     CEFTAZIDIME <=1 Sensitive     CEFEPIME <=1 Sensitive     GENTAMICIN <=1 Sensitive     TOBRAMYCIN <=1 Sensitive     CIPROFLOXACIN <=0.25 Sensitive     LEVOFLOXACIN <=0.12 Sensitive     NITROFURANTOIN <=16 Sensitive     TRIMETH/SULFA* <=20 Sensitive      * NR=NOT REPORTABLE,SEE COMMENTORAL therapy:A cefazolin MIC of <32 predicts susceptibility to the oral agents cefaclor,cefdinir,cefpodoxime,cefprozil,cefuroxime,cephalexin,and loracarbef when used for therapy of uncomplicated UTIs due to E.coli,K.pneumomiae,and P.mirabilis. PARENTERAL therapy: A cefazolinMIC of >8 indicates resistance to parenteralcefazolin. An alternate test method must beperformed to confirm susceptibility to parenteralcefazolin.    Coagulation Studies: No results for input(s): LABPROT, INR in the last 72 hours.  Urinalysis: No results for input(s): COLORURINE, LABSPEC, PHURINE, GLUCOSEU, HGBUR, BILIRUBINUR, KETONESUR, PROTEINUR, UROBILINOGEN, NITRITE, LEUKOCYTESUR in the last 72 hours.  Invalid input(s): APPERANCEUR    Imaging: US Renal  Result Date: 12/18/2017 CLINICAL DATA:  Hydronephrosis aunt LOWER abdominal pain for 2-3 months. History of cystoscopy with bilateral stent placement 12/17/2017. EXAM: RENAL / URINARY TRACT ULTRASOUND COMPLETE COMPARISON:  CT of the abdomen and pelvis on 12/16/2017 FINDINGS: Right Kidney: Length: 12.1 centimeters. There is moderate hydronephrosis. Stent is visualized within the RIGHT renal pelvis. No solid or cystic renal  mass. Left Kidney: Length: 12.7 centimeter. There is moderate hydronephrosis. The ureteral stent is not well seen in the  LEFT renal pelvis. Bladder: Bladder is decompressed by a Foley catheter. Additional: There are bilateral pleural effusions.  Ascites. IMPRESSION: 1. Moderate bilateral hydronephrosis. 2. Foley catheter decompresses the bladder. 3. Bilateral pleural effusions.  Ascites. Electronically Signed   By: Nolon Nations M.D.   On: 12/18/2017 16:36   Ir Nephrostomy Placement Left  Result Date: 12/20/2017 INDICATION: Bilateral ureteral obstruction with occluded ureteral stents EXAM: IR NEPHROSTOMY PLACEMENT LEFT COMPARISON:  None. MEDICATIONS: 2 g IV Ancef; The antibiotic was administered in an appropriate time frame prior to skin puncture. ANESTHESIA/SEDATION: Fentanyl 75 mcg IV; Versed 1 mg IV Moderate Sedation Time:  35 The patient was continuously monitored during the procedure by the interventional radiology nurse under my direct supervision. CONTRAST:  15mL ISOVUE-300 IOPAMIDOL (ISOVUE-300) INJECTION 61% - administered into the collecting system(s) FLUOROSCOPY TIME:  Fluoroscopy Time: 5 minutes 48 seconds (356 mGy). COMPLICATIONS: None immediate. PROCEDURE: Informed written consent was obtained from the patient after a thorough discussion of the procedural risks, benefits and alternatives. All questions were addressed. Maximal Sterile Barrier Technique was utilized including caps, mask, sterile gowns, sterile gloves, sterile drape, hand hygiene and skin antiseptic. A timeout was performed prior to the initiation of the procedure. Utilizing 1% lidocaine as a local and deep anesthetic and fluoroscopic guidance, a 21 gauge needle was utilized to puncture the collecting system in the region of the left renal pelvis guided by a previously placed stent. Brown colored urine likely related to prior hemorrhage was withdrawn from the needle and contrast injection confirmed location within the collecting system. Subsequently a small amount of room air was injected to allow for delineation of posterior caliceal system. A lower  pole posterior calyx was identified and again utilizing 1% xylocaine as local anesthetic and fluoroscopic guidance, a 20 gauge diamond tip needle was passed into the posterior calyx. Both air and urine were withdrawn from the needle and subsequently a 0.018 platinum tip guidewire was advanced into the collecting system. Over this, a 6 [place] Accustick dilator was placed into the collecting system and a J wire coiled within the renal pelvis. The axis sick dilator was then removed and the tract dilated to 8 [place]. The dilator was then removed and a 8 French pigtail catheter was placed over the wire and coiled within the renal pelvis. The catheter was initially placed adjacent to the body of the stent although no significant drainage of urine was noted. Contrast material was injected which demonstrates a significant amount of thrombus within the collecting system and for this reason the catheter was advanced into the superior aspect of the renal pelvis where adequate urine drainage was noted. The catheter was then sutured into place utilizing 0 Prolene and a Hopkins buttress. Catheter was placed external gravity drain and bloody urine was obtained. The patient tolerated the catheter placement well and attention was then turned to the right collecting system. IMPRESSION: Successful placement of an 8 French pigtail catheter within the left renal collecting system. Thrombus is noted within the collecting system which may limit drainage somewhat. This should resolve over time. Electronically Signed   By: Inez Catalina M.D.   On: 12/20/2017 08:02   Ir Nephrostomy Placement Right  Result Date: 12/20/2017 INDICATION: Ureteral occlusion with occluded ureteral stents EXAM: IR NEPHROSTOMY PLACEMENT RIGHT COMPARISON:  None. MEDICATIONS: 2 g IV Ancef; The antibiotic was administered in an appropriate time frame prior  to skin puncture. ANESTHESIA/SEDATION: Fentanyl 75 mcg IV; Versed 1 mg IV Moderate Sedation Time:  35 The  patient was continuously monitored during the procedure by the interventional radiology nurse under my direct supervision. CONTRAST:  89mL ISOVUE-300 IOPAMIDOL (ISOVUE-300) INJECTION 61% - administered into the collecting system(s) FLUOROSCOPY TIME:  Fluoroscopy Time: 5 minutes 48 seconds (356 mGy). COMPLICATIONS: None immediate. PROCEDURE: Informed written consent was obtained from the patient after a thorough discussion of the procedural risks, benefits and alternatives. All questions were addressed. Maximal Sterile Barrier Technique was utilized including caps, mask, sterile gowns, sterile gloves, sterile drape, hand hygiene and skin antiseptic. A timeout was performed prior to the initiation of the procedure. Utilizing 1% xylocaine as local anesthetic and fluoroscopic guidance a 21 gauge Chiba needle was placed into the right collecting system utilizing the pigtail from the previously placed ureteral stent as a guide. Brown tinged urine was withdrawn from the needle and subsequently a small amount of contrast was instilled to confirm needle placement. This demonstrates dilated collecting system. Subsequently a small amount of room air was instilled to allow for delineation of a posterior calyx. Again utilizing 1% xylocaine as local anesthetic and fluoroscopic guidance, a 20 gauge diamond tip needle was placed into the upper pole posterior calyx without difficulty. A 0.018 platinum tip guidewire was then advanced into the collecting system and coiled within the renal pelvis. A 6 French Accustick dilator was then placed over the microwire into the collecting system. The central dilator in stiff near were then removed and a J tipped guidewire was coiled within the renal pelvis. Dilatation to 10 [place] was then performed and a 10 French nephrostomy catheter placed over the guidewire into the renal pelvis. Brisk return of bloody urine was then noted. The catheter was then attached to an external gravity bag. The  catheter was sutured into place utilizing 0 Prolene and a Hopkins buttress. Patient tolerated the procedure well and was returned to her room in satisfactory condition. IMPRESSION: Successful right percutaneous nephrostomy placement as described above. Bloody urine was noted at the termination of the procedure which should clear over time. Electronically Signed   By: Inez Catalina M.D.   On: 12/20/2017 08:06      Assessment & Plan: Pt is a 57 y.o. female with a PMHx of breast cancer status post bilateral mastectomy with chemotherapy and radiation therapy, anxiety, GERD, irritable bowel syndrome, history of neutropenia, plantar fasciitis, who was admitted to [facility] on 12/16/2017 for evaluation of abnormal lab values including hypercalcemia as well as acute renal failure.   1.  Acute renal failure due to obstructive uropathy. 2.  Bilateral hydronephrosis. 3.  Hypokalemia.  4.  Bladder mass with bony metastasis.  5.  Anemia unspecified.   Plan:  We are asked to see the patient for evaluation management of acute renal failure.  She was found to have a bladder mass along with obstructive uropathy and bilateral hydronephrosis.  She underwent bilateral nephrostomy placement yesterday.  Thus far good urine output noted.  Creatinine has started to trend down and is currently 4.27.  No acute indication for dialysis at the moment as long as renal function continues to improve.  Avoid IV contrast if possible.  We will administer potassium chloride 40 mg IV x1 today.  Otherwise management as per urology and hospitalist.  Thanks for consultation.

## 2017-12-20 NOTE — Progress Notes (Signed)
ANTICOAGULATION CONSULT NOTE - Follow up [facility] for Heparin  Indication: chest pain/ACS  Allergies  Allergen Reactions  . Adhesive [Tape] Other (See Comments)    Redness:Please use paper tape    Patient Measurements: Height: 5\' 4"  (162.6 cm) Weight: 159 lb 12.8 oz (72.5 kg) IBW/kg (Calculated) : 54.7 Heparin Dosing Weight: 69.6 kg  Vital Signs: Temp: 98.1 F (36.7 C) (10/02 0245) Temp Source: Oral (10/02 0245) BP: 134/78 (10/02 0245) Pulse Rate: 94 (10/02 0245)  Labs: Recent Labs    12/18/17 0649 12/19/17 0620 12/20/17 0415  HGB 11.7* 10.8* 11.3*  HCT 33.8* 31.4* 32.3*  PLT 230 217 238  HEPARINUNFRC 0.49 0.37 0.13*  CREATININE 2.31* 4.78* 4.27*    Estimated Creatinine Clearance: 14.4 mL/min (A) (by C-G formula based on SCr of 4.27 mg/dL (H)).   Assessment: Pharmacy consulted for heparin drip dosing and monitoring in 57 yo female admitted with Hypercalcemia and found to have DVT.  Current rate of heparin 1100 units/hr (=11 ml/hr).   Heparin course: 10/01 AM heparin level 0.37. Continue current regimen. Recheck heparin level and CBC with tomorrow AM labs.  Goal of Therapy:  Heparin level 0.3-0.7 units/ml Monitor platelets by anticoagulation protocol: Yes   Plan:  Heparin stopped for procedure at 0948. Per RN procedure planned for ~3 pm. Will need to follow up heparin plans after procedure (anticipate heparin to continue after procedure), when to restart heparin drip after procedure, and recheck HL 8h after restart.   10/02 AM heparin level 0.13. 2100 unit bolus and increase rate to 1400 units/hr. Recheck in 8 hours.   Sim Boast, PharmD, BCPS  12/20/17 6:25 AM

## 2017-12-20 NOTE — Progress Notes (Addendum)
**Note Mary-Identified via Obfuscation** Patient ID: Mary [name], female   DOB: October 04, 1960, 57 y.o.   MRN: [id number]  [facility] PROGRESS NOTE  STEFFANY SCHOENFELDER QIO:[id number] DOB: March 29, 1960 DOA: 12/16/2017 PCP: [name], MD  HPI/Subjective: Patient feeling okay.  Still feels bloated in the abdomen.  Still with some constipation.  Still with some dizziness.  Objective: Vitals:   12/20/17 0245 12/20/17 0754  BP: 134/78 (!) 146/82  Pulse: 94 96  Resp:  18  Temp: 98.1 F (36.7 C) (!) 97.5 F (36.4 C)  SpO2: 93% 93%    Filed Weights   12/16/17 0318 12/16/17 1225  Weight: 72.6 kg 72.5 kg    ROS: Review of Systems  Constitutional: Negative for chills and fever.  Eyes: Negative for blurred vision.  Respiratory: Negative for cough and shortness of breath.   Cardiovascular: Negative for chest pain.  Gastrointestinal: Positive for constipation. Negative for abdominal pain, diarrhea, nausea and vomiting.  Genitourinary: Negative for dysuria.  Musculoskeletal: Negative for joint pain.  Neurological: Positive for dizziness. Negative for headaches.   Exam: Physical Exam  Constitutional: She is oriented to person, place, and time.  HENT:  Nose: No mucosal edema.  Mouth/Throat: No oropharyngeal exudate or posterior oropharyngeal edema.  Eyes: Pupils are equal, round, and reactive to light. Conjunctivae, EOM and lids are normal.  Neck: No JVD present. Carotid bruit is not present. No edema present. No thyroid mass and no thyromegaly present.  Cardiovascular: S1 normal and S2 normal. Exam reveals no gallop.  No murmur heard. Pulses:      Dorsalis pedis pulses are 2+ on the right side, and 2+ on the left side.  Respiratory: No respiratory distress. She has no wheezes. She has no rhonchi. She has no rales.  GI: Soft. Bowel sounds are normal. She exhibits distension. There is generalized tenderness.  Musculoskeletal:       Right ankle: She exhibits no swelling.       Left ankle: She exhibits no swelling.   Lymphadenopathy:    She has no cervical adenopathy.  Neurological: She is alert and oriented to person, place, and time. No cranial nerve deficit.  Skin: Skin is warm. No rash noted. Nails show no clubbing.  Psychiatric: She has a normal mood and affect.      Data Reviewed: Basic Metabolic Panel: Recent Labs  Lab 12/16/17 0332 12/16/17 1438 12/17/17 0458 12/18/17 0649 12/19/17 0620 12/20/17 0415  NA 139  --  139 141 137 139  K 3.0*  --  3.0* 3.3* 3.2* 3.2*  CL 102  --  105 109 109 110  CO2 28  --  26 23 20* 17*  GLUCOSE 113*  --  106* 113* 133* 119*  BUN 30*  --  19 22* 30* 33*  CREATININE 1.63*  --  1.40* 2.31* 4.78* 4.27*  CALCIUM 13.9* 12.2* 10.8* 9.6 8.3* 8.0*  MG 2.1  --   --   --   --   --   PHOS 3.1  --   --   --   --   --    Liver Function Tests: Recent Labs  Lab 12/15/17 1525 12/16/17 0332  AST 36* 39  ALT 37* 43  ALKPHOS  --  110  BILITOT 0.3 0.6  PROT 6.4 6.9  ALBUMIN  --  3.8   CBC: Recent Labs  Lab 12/16/17 0332 12/17/17 0458 12/18/17 0649 12/19/17 0620 12/20/17 0415  WBC 7.6 7.0 10.3 9.2 9.4  HGB 13.0 11.8* 11.7* 10.8* 11.3*  HCT 37.5 33.4* 33.8* 31.4* 32.3*  MCV 91.3 91.5 93.2 93.0 93.0  PLT 201 205 230 217 238     Studies: Dg Chest 2 View  Result Date: 12/20/2017 CLINICAL DATA:  Shortness of breath. Weakness. EXAM: CHEST - 2 VIEW COMPARISON:  12/02/2011 FINDINGS: The cardiac silhouette is normal in size. Left chest Port-A-Cath has been removed. Surgical clips are present in the axillae. There are new moderate right and small left pleural effusions with atelectasis or consolidation in both lung bases, particularly medially. No pneumothorax is identified. No acute osseous abnormality is seen. IMPRESSION: New moderate right and small left pleural effusions with bibasilar atelectasis or consolidation. Electronically Signed   By: Logan Bores M.D.   On: 12/20/2017 13:43   US Renal  Result Date: 12/18/2017 CLINICAL DATA:  Hydronephrosis  aunt LOWER abdominal pain for 2-3 months. History of cystoscopy with bilateral stent placement 12/17/2017. EXAM: RENAL / URINARY TRACT ULTRASOUND COMPLETE COMPARISON:  CT of the abdomen and pelvis on 12/16/2017 FINDINGS: Right Kidney: Length: 12.1 centimeters. There is moderate hydronephrosis. Stent is visualized within the RIGHT renal pelvis. No solid or cystic renal mass. Left Kidney: Length: 12.7 centimeter. There is moderate hydronephrosis. The ureteral stent is not well seen in the LEFT renal pelvis. Bladder: Bladder is decompressed by a Foley catheter. Additional: There are bilateral pleural effusions.  Ascites. IMPRESSION: 1. Moderate bilateral hydronephrosis. 2. Foley catheter decompresses the bladder. 3. Bilateral pleural effusions.  Ascites. Electronically Signed   By: Nolon Nations M.D.   On: 12/18/2017 16:36   Ir Nephrostomy Placement Left  Result Date: 12/20/2017 INDICATION: Bilateral ureteral obstruction with occluded ureteral stents EXAM: IR NEPHROSTOMY PLACEMENT LEFT COMPARISON:  None. MEDICATIONS: 2 g IV Ancef; The antibiotic was administered in an appropriate time frame prior to skin puncture. ANESTHESIA/SEDATION: Fentanyl 75 mcg IV; Versed 1 mg IV Moderate Sedation Time:  35 The patient was continuously monitored during the procedure by the interventional radiology nurse under my direct supervision. CONTRAST:  59mL ISOVUE-300 IOPAMIDOL (ISOVUE-300) INJECTION 61% - administered into the collecting system(s) FLUOROSCOPY TIME:  Fluoroscopy Time: 5 minutes 48 seconds (356 mGy). COMPLICATIONS: None immediate. PROCEDURE: Informed written consent was obtained from the patient after a thorough discussion of the procedural risks, benefits and alternatives. All questions were addressed. Maximal Sterile Barrier Technique was utilized including caps, mask, sterile gowns, sterile gloves, sterile drape, hand hygiene and skin antiseptic. A timeout was performed prior to the initiation of the procedure.  Utilizing 1% lidocaine as a local and deep anesthetic and fluoroscopic guidance, a 21 gauge needle was utilized to puncture the collecting system in the region of the left renal pelvis guided by a previously placed stent. Brown colored urine likely related to prior hemorrhage was withdrawn from the needle and contrast injection confirmed location within the collecting system. Subsequently a small amount of room air was injected to allow for delineation of posterior caliceal system. A lower pole posterior calyx was identified and again utilizing 1% xylocaine as local anesthetic and fluoroscopic guidance, a 20 gauge diamond tip needle was passed into the posterior calyx. Both air and urine were withdrawn from the needle and subsequently a 0.018 platinum tip guidewire was advanced into the collecting system. Over this, a 6 [place] Accustick dilator was placed into the collecting system and a J wire coiled within the renal pelvis. The axis sick dilator was then removed and the tract dilated to 8 [place]. The dilator was then removed and a 8 French pigtail catheter was placed  over the wire and coiled within the renal pelvis. The catheter was initially placed adjacent to the body of the stent although no significant drainage of urine was noted. Contrast material was injected which demonstrates a significant amount of thrombus within the collecting system and for this reason the catheter was advanced into the superior aspect of the renal pelvis where adequate urine drainage was noted. The catheter was then sutured into place utilizing 0 Prolene and a Hopkins buttress. Catheter was placed external gravity drain and bloody urine was obtained. The patient tolerated the catheter placement well and attention was then turned to the right collecting system. IMPRESSION: Successful placement of an 8 French pigtail catheter within the left renal collecting system. Thrombus is noted within the collecting system which may limit drainage  somewhat. This should resolve over time. Electronically Signed   By: Inez Catalina M.D.   On: 12/20/2017 08:02   Ir Nephrostomy Placement Right  Result Date: 12/20/2017 INDICATION: Ureteral occlusion with occluded ureteral stents EXAM: IR NEPHROSTOMY PLACEMENT RIGHT COMPARISON:  None. MEDICATIONS: 2 g IV Ancef; The antibiotic was administered in an appropriate time frame prior to skin puncture. ANESTHESIA/SEDATION: Fentanyl 75 mcg IV; Versed 1 mg IV Moderate Sedation Time:  35 The patient was continuously monitored during the procedure by the interventional radiology nurse under my direct supervision. CONTRAST:  74mL ISOVUE-300 IOPAMIDOL (ISOVUE-300) INJECTION 61% - administered into the collecting system(s) FLUOROSCOPY TIME:  Fluoroscopy Time: 5 minutes 48 seconds (356 mGy). COMPLICATIONS: None immediate. PROCEDURE: Informed written consent was obtained from the patient after a thorough discussion of the procedural risks, benefits and alternatives. All questions were addressed. Maximal Sterile Barrier Technique was utilized including caps, mask, sterile gowns, sterile gloves, sterile drape, hand hygiene and skin antiseptic. A timeout was performed prior to the initiation of the procedure. Utilizing 1% xylocaine as local anesthetic and fluoroscopic guidance a 21 gauge Chiba needle was placed into the right collecting system utilizing the pigtail from the previously placed ureteral stent as a guide. Brown tinged urine was withdrawn from the needle and subsequently a small amount of contrast was instilled to confirm needle placement. This demonstrates dilated collecting system. Subsequently a small amount of room air was instilled to allow for delineation of a posterior calyx. Again utilizing 1% xylocaine as local anesthetic and fluoroscopic guidance, a 20 gauge diamond tip needle was placed into the upper pole posterior calyx without difficulty. A 0.018 platinum tip guidewire was then advanced into the collecting  system and coiled within the renal pelvis. A 6 French Accustick dilator was then placed over the microwire into the collecting system. The central dilator in stiff near were then removed and a J tipped guidewire was coiled within the renal pelvis. Dilatation to 10 [place] was then performed and a 10 French nephrostomy catheter placed over the guidewire into the renal pelvis. Brisk return of bloody urine was then noted. The catheter was then attached to an external gravity bag. The catheter was sutured into place utilizing 0 Prolene and a Hopkins buttress. Patient tolerated the procedure well and was returned to her room in satisfactory condition. IMPRESSION: Successful right percutaneous nephrostomy placement as described above. Bloody urine was noted at the termination of the procedure which should clear over time. Electronically Signed   By: Inez Catalina M.D.   On: 12/20/2017 08:06    Scheduled Meds: . amLODipine  5 mg Oral Daily  . exemestane  25 mg Oral QPC breakfast  . Influenza vac split quadrivalent PF  0.5 mL Intramuscular Tomorrow-1000  . pantoprazole  40 mg Oral Daily  . polyethylene glycol  17 g Oral q1800  . scopolamine  1 patch Transdermal Q72H   Continuous Infusions: . sodium chloride 40 mL/hr at 12/19/17 2023  . heparin 1,400 Units/hr (12/20/17 0626)  . potassium chloride 10 mEq (12/20/17 1522)    Assessment/Plan:  1. Acute kidney injury.  Creatinine improved slightly today.  Nephrostomy tubes help out with urine drainage for obstructive nephropathy.   2. Severe hypercalcemia on presentation.  Calcium improved to normal range.  Status post Zometa. 3. Metastatic breast cancer, to bone and bladder wall and peritoneum.  Follow-up with oncology.  Goal of treatment is now palliative in nature since the patient does have metastatic cancer. 4. Acute DVT left lower extremity.  On heparin drip.  Hopefully will be able to switch over to Eliquis upon disposition. 5. Dizziness likely inner  ear issue.  Tympanic membrane normal bilaterally.  MRI brain with contrast unable to be done at this time.. 6. Weakness.  Physical therapy appreciated 7. Hypertension on norvasc 8. Constipation.  Dulcolax suppository x1.  If that does not work we will try lactulose  Code Status:     Code Status Orders  (From admission, onward)         Start     Ordered   12/16/17 1248  Full code  Continuous     12/16/17 1247        Code Status History    Date Active Date Inactive Code Status Order ID Comments User Context   05/18/2012 1813 05/20/2012 1306 Full Code [id number]  Adin Hector, MD Inpatient    Advance Directive Documentation     Most Recent Value  Type of Advance Directive  Living will  Pre-existing out of facility DNR order (yellow form or pink MOST form)  -  "MOST" Form in Place?  -     Family Communication: Family at bedside Disposition Plan: To be determined  Consultants:  Urology  Oncology  Nephrology  Time spent: 29 minutes  [place]

## 2017-12-20 NOTE — Progress Notes (Signed)
Foley catheter discontinued, patient tolerated well. No complaints. Will continue to monitor patient.

## 2017-12-20 NOTE — Progress Notes (Signed)
ANTICOAGULATION CONSULT NOTE - Follow up [facility] for Heparin  Indication: chest pain/ACS  Allergies  Allergen Reactions  . Adhesive [Tape] Other (See Comments)    Redness:Please use paper tape    Patient Measurements: Height: 5\' 4"  (162.6 cm) Weight: 159 lb 12.8 oz (72.5 kg) IBW/kg (Calculated) : 54.7 Heparin Dosing Weight: 69.6 kg  Vital Signs: Temp: 97.5 F (36.4 C) (10/02 0754) Temp Source: Oral (10/02 0754) BP: 146/82 (10/02 0754) Pulse Rate: 96 (10/02 0754)  Labs: Recent Labs    12/18/17 0649 12/19/17 0620 12/20/17 0415 12/20/17 1519  HGB 11.7* 10.8* 11.3*  --   HCT 33.8* 31.4* 32.3*  --   PLT 230 217 238  --   HEPARINUNFRC 0.49 0.37 0.13* 0.41  CREATININE 2.31* 4.78* 4.27*  --     Estimated Creatinine Clearance: 14.4 mL/min (A) (by C-G formula based on SCr of 4.27 mg/dL (H)).   Assessment: Pharmacy consulted for heparin drip dosing and monitoring in 57 yo female admitted with Hypercalcemia and found to have DVT.  Current rate of heparin 1100 units/hr (=11 ml/hr).   Heparin course: 10/01 AM heparin level 0.37. Continue current regimen. Recheck heparin level and CBC with tomorrow AM labs.  Goal of Therapy:  Heparin level 0.3-0.7 units/ml Monitor platelets by anticoagulation protocol: Yes   Plan:  Heparin stopped for procedure at 0948. Per RN procedure planned for ~3 pm. Will need to follow up heparin plans after procedure (anticipate heparin to continue after procedure), when to restart heparin drip after procedure, and recheck HL 8h after restart.   10/02 AM heparin level 0.13. 2100 unit bolus and increase rate to 1400 units/hr. Recheck in 8 hours.  10/02 1519 heparin level 0.41.  Will continue current rate of 1400 units/hr.  Will recheck heparin level in 8 hours.  Evelena Asa, PharmD 12/20/17 4:07 PM

## 2017-12-20 NOTE — Progress Notes (Signed)
PT Cancellation Note  Patient Details Name: ALLETA AVERY MRN: [id number] DOB: 15-Sep-1960   Cancelled Treatment:    Reason Eval/Treat Not Completed: Patient at procedure or test/unavailable, will attempt to see pt at a future date as medically appropriate.     Linus Salmons PT, DPT 12/20/17, 1:48 PM

## 2017-12-21 ENCOUNTER — Other Ambulatory Visit: Payer: Self-pay | Admitting: Oncology

## 2017-12-21 DIAGNOSIS — N1330 Unspecified hydronephrosis: Secondary | ICD-10-CM

## 2017-12-21 DIAGNOSIS — C50919 Malignant neoplasm of unspecified site of unspecified female breast: Secondary | ICD-10-CM

## 2017-12-21 DIAGNOSIS — C50412 Malignant neoplasm of upper-outer quadrant of left female breast: Secondary | ICD-10-CM

## 2017-12-21 LAB — SURGICAL PATHOLOGY

## 2017-12-21 LAB — BASIC METABOLIC PANEL
ANION GAP: 8 (ref 5–15)
BUN: 23 mg/dL — ABNORMAL HIGH (ref 6–20)
CALCIUM: 7.8 mg/dL — AB (ref 8.9–10.3)
CHLORIDE: 111 mmol/L (ref 98–111)
CO2: 20 mmol/L — ABNORMAL LOW (ref 22–32)
Creatinine, Ser: 2.16 mg/dL — ABNORMAL HIGH (ref 0.44–1.00)
GFR calc non Af Amer: 24 mL/min — ABNORMAL LOW (ref >60)
GFR, EST AFRICAN AMERICAN: 28 mL/min — AB (ref >60)
Glucose, Bld: 117 mg/dL — ABNORMAL HIGH (ref 70–99)
Potassium: 3.1 mmol/L — ABNORMAL LOW (ref 3.5–5.1)
SODIUM: 139 mmol/L (ref 135–145)

## 2017-12-21 LAB — CBC
HCT: 29 % — ABNORMAL LOW (ref 35.0–47.0)
Hemoglobin: 10.5 g/dL — ABNORMAL LOW (ref 12.0–16.0)
MCH: 33.2 pg (ref 26.0–34.0)
MCHC: 36 g/dL (ref 32.0–36.0)
MCV: 92.3 fL (ref 80.0–100.0)
PLATELETS: 263 10*3/uL (ref 150–440)
RBC: 3.15 MIL/uL — AB (ref 3.80–5.20)
RDW: 12.9 % (ref 11.5–14.5)
WBC: 7.4 10*3/uL (ref 3.6–11.0)

## 2017-12-21 LAB — HEPARIN LEVEL (UNFRACTIONATED): HEPARIN UNFRACTIONATED: 0.4 [IU]/mL (ref 0.30–0.70)

## 2017-12-21 MED ORDER — POTASSIUM CHLORIDE CRYS ER 20 MEQ PO TBCR
40.0000 meq | EXTENDED_RELEASE_TABLET | Freq: Once | ORAL | Status: AC
  Administered 2017-12-21: 40 meq via ORAL
  Filled 2017-12-21: qty 2

## 2017-12-21 MED ORDER — LIDOCAINE-PRILOCAINE 2.5-2.5 % EX CREA: TOPICAL_CREAM | CUTANEOUS | 3 refills | Status: DC

## 2017-12-21 MED ORDER — PROCHLORPERAZINE MALEATE 10 MG PO TABS: 10.0000 mg | ORAL_TABLET | Freq: Four times a day (QID) | ORAL | 1 refills | Status: DC | PRN

## 2017-12-21 MED ORDER — INFLUENZA VAC SPLIT QUAD 0.5 ML IM SUSY
0.5000 mL | PREFILLED_SYRINGE | INTRAMUSCULAR | Status: AC
  Administered 2017-12-21: 0.5 mL via INTRAMUSCULAR
  Filled 2017-12-21: qty 0.5

## 2017-12-21 NOTE — Progress Notes (Signed)
Lightly bloody drainage from each nephrostomy tube.

## 2017-12-21 NOTE — Progress Notes (Signed)
Hematology/Oncology Progress Note [facility] Telephone:([phone] [phone]  Patient Care Team: [name], MD as PCP - General (Internal Medicine) Serena Colonel, RN as [place] Management   Name of the patient: Mary [name]  [id number]  Jan 27, 1967  Date of visit: 12/21/17   INTERVAL HISTORY-  No acute overnight events.  Status post percutaneous nephrostomy tubes bilaterally.  Draining well.  Creatinine trending down dramatically S/p percutaneous nephrostomy tubes bilaterally. Creatinine trending down.  Has passed a bowel movement.  Feels better.  Review of Systems  Constitutional: Positive for malaise/fatigue.  HENT: Negative for sore throat.   Eyes: Negative for blurred vision.  Cardiovascular: Negative for chest pain.  Gastrointestinal: Negative for constipation, nausea and vomiting.  Genitourinary: Negative for urgency.  Skin: Negative for rash.  Psychiatric/Behavioral: Negative for hallucinations.     Allergies  Allergen Reactions  . Adhesive [Tape] Other (See Comments)    Redness:Please use paper tape    Patient Active Problem List   Diagnosis Date Noted  . Edema of left lower extremity   . Metastatic breast cancer ([place])   . Hypercalcemia 12/16/2017  . Malignancy ([place])   . Acute deep vein thrombosis (DVT) of distal vein of left lower extremity ([place])   . Dizziness   . Bilateral posterior neck pain 11/24/2017  . Elevated liver enzymes 11/24/2017  . Tubular adenoma of colon 10/11/2017  . 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 Generalized anxiety disorder 09/19/2017  . Vertigo, labyrinthine, bilateral 08/27/2017  . Prediabetes 08/27/2017  . Encounter for screening colonoscopy 09/21/2014  . Other malaise and fatigue 12/14/2013  . Hot flashes, menopausal 08/08/2012  . Neutropenia, drug-induced ([place]) 12/15/2011  . Primary cancer of upper outer quadrant of left female breast ([place])  11/17/2011  . Hypertension 09/23/2011  . PLANTAR FASCIITIS, LEFT 12/01/2009  . UNEQUAL LEG LENGTH 12/01/2009     Past Medical History:  Diagnosis Date  . Anxiety    takes Xanax prn  . Breast cancer [facility]) August 2013   bilateral, er/pr+  . Complication of anesthesia    pt states she had the shakes extremely bad  . Cough    at night d/t reflux;nothing productive  . Dental crowns present    x 4  . GERD (gastroesophageal reflux disease)    takes Nexium daily  . History of bronchitis    08/2011  . History of colon polyps   . Hot flashes, menopausal 08/08/2012  . IBS (irritable bowel syndrome)    immodium prn  . Insomnia    takes Ambien nightly  . Irritable bowel syndrome   . Neutropenia, drug-induced ([place]) 12/15/2011  . Nocturia   . Plantar fasciitis   . S/P radiation therapy 07/03/2012 through 08/18/2038                                                        Right chest wall/regional lymph nodes 5040 cGy 28 sessions, right chest wall/mastectomy scar boost 1000 cGy 5 sessions                          . Status post chemotherapy    FEC 100 starting on 12/08/2011 - 01/18/12/single agent Taxol x12 weeks from 02/02/2012 through January 2014   . Urinary  frequency   . Use of tamoxifen (Nolvadex)   . Vertigo      Past Surgical History:  Procedure Laterality Date  . BREAST LUMPECTOMY  2008   left  . COLONOSCOPY    . COLONOSCOPY WITH PROPOFOL N/A 10/10/2017   Procedure: COLONOSCOPY WITH PROPOFOL;  Surgeon: Lucilla Lame, MD;  Location: [facility] ENDOSCOPY;  Service: Endoscopy;  Laterality: N/A;  . CYSTOSCOPY WITH STENT PLACEMENT Bilateral 12/17/2017   Procedure: CYSTOSCOPY WITH STENT PLACEMENT;  Surgeon: Hollice Espy, MD;  Location: [facility] ORS;  Service: Urology;  Laterality: Bilateral;  . IR NEPHROSTOMY PLACEMENT LEFT  12/19/2017  . IR NEPHROSTOMY PLACEMENT RIGHT  12/19/2017  . MASTECTOMY    . MASTECTOMY WITH AXILLARY LYMPH NODE DISSECTION Right 05/18/2012   Procedure: MASTECTOMY WITH  AXILLARY LYMPH NODE DISSECTION;  Surgeon: Adin Hector, MD;  Location: [place];  Service: General;  Laterality: Right;  Bilateral Total Mastectomy , right axillary lymph node dissection left axillary sentinel node biopsy, removal of Porta Cath  . PORT-A-CATH REMOVAL N/A 05/18/2012   Procedure: REMOVAL PORT-A-CATH;  Surgeon: Adin Hector, MD;  Location: [place];  Service: General;  Laterality: N/A;  . PORTACATH PLACEMENT  12/02/2011   Procedure: INSERTION PORT-A-CATH;  Surgeon: Adin Hector, MD;  Location: [place];  Service: General;  Laterality: N/A;  insertion port a cath with fluoroscopy  . SIMPLE MASTECTOMY WITH AXILLARY SENTINEL NODE BIOPSY Left 05/18/2012   Procedure: SIMPLE MASTECTOMY WITH AXILLARY SENTINEL NODE BIOPSY;  Surgeon: Adin Hector, MD;  Location: [place];  Service: General;  Laterality: Left;    Social History   Socioeconomic History  . Marital status: Married    Spouse name: Not on file  . Number of children: Not on file  . Years of education: Not on file  . Highest education level: Not on file  Occupational History  . Occupation: Network engineer II    Employer: [employer]  Social Needs  . Financial resource strain: Not on file  . Food insecurity:    Worry: Not on file    Inability: Not on file  . Transportation needs:    Medical: Not on file    Non-medical: Not on file  Tobacco Use  . Smoking status: Former Smoker    Packs/day: 0.50    Last attempt to quit: 03/21/1997    Years since quitting: 20.7  . Smokeless tobacco: Never Used  Substance and Sexual Activity  . Alcohol use: Yes    Alcohol/week: 0.0 standard drinks    Comment: occasionally wine  . Drug use: No  . Sexual activity: Yes    Birth control/protection: None  Lifestyle  . Physical activity:    Days per week: Not on file    Minutes per session: Not on file  . Stress: Not on file  Relationships  . Social connections:    Talks on phone: Not on file    Gets together: Not on  file    Attends religious service: Not on file    Active member of club or organization: Not on file    Attends meetings of clubs or organizations: Not on file    Relationship status: Not on file  . Intimate partner violence:    Fear of current or ex partner: Not on file    Emotionally abused: Not on file    Physically abused: Not on file    Forced sexual activity: Not on file  Other Topics Concern  . Not on file  Social History Narrative   Married to [employer]  No children 55 years old with fmp.  20 year use of birth control pills.       Family History  Problem Relation Age of Onset  . Hypertension Mother   . Hypertension Maternal Grandmother   . Pancreatic cancer Paternal Grandmother        diagnosed in her 66s  . Ovarian cancer Maternal Aunt        maternal grandmother's sister  . Breast cancer Paternal Aunt        diagnosed in late 29s early 90s  . Parkinsonism Paternal Grandfather      Current Facility-Administered Medications:  .  acetaminophen (TYLENOL) tablet 650 mg, 650 mg, Oral, Q6H PRN, 650 mg at 12/21/17 1041 **OR** acetaminophen (TYLENOL) suppository 650 mg, 650 mg, Rectal, Q6H PRN, Jodell Cipro, Prasanna, MD .  amLODipine (NORVASC) tablet 5 mg, 5 mg, Oral, Daily, Jodell Cipro, Prasanna, MD, 5 mg at 12/21/17 1035 .  bisacodyl (DULCOLAX) EC tablet 5 mg, 5 mg, Oral, Daily PRN, Arta Silence, MD, 5 mg at 12/19/17 1006 .  diazepam (VALIUM) tablet 10 mg, 10 mg, Oral, QHS PRN, Jodell Cipro, Prasanna, MD .  exemestane (AROMASIN) tablet 25 mg, 25 mg, Oral, QPC breakfast, Jodell Cipro, Prasanna, MD, 25 mg at 12/21/17 1035 .  [COMPLETED] heparin bolus via infusion 4,000 Units, 4,000 Units, Intravenous, Once, 4,000 Units at 12/16/17 1457 **FOLLOWED BY** heparin ADULT infusion 100 units/mL (25000 units/238m sodium chloride 0.45%), 1,400 Units/hr, Intravenous, Continuous, KEpifanio Lesches MD, Last Rate: 14 mL/hr at 12/21/17 1651, 1,400 Units/hr at 12/21/17 1651 .   HYDROmorphone (DILAUDID) injection 0.5-1 mg, 0.5-1 mg, Intravenous, Q3H PRN, SArta Silence MD, 1 mg at 12/20/17 1646 .  ondansetron (ZOFRAN) tablet 4 mg, 4 mg, Oral, Q6H PRN **OR** ondansetron (ZOFRAN) injection 4 mg, 4 mg, Intravenous, Q6H PRN, SJodell Cipro Prasanna, MD, 4 mg at 12/21/17 1515 .  opium-belladonna (B&O SUPPRETTES) 16.2-60 MG suppository 1 suppository, 1 suppository, Rectal, Q8H PRN, BHollice Espy MD .  oxybutynin ([facility] tablet 5 mg, 5 mg, Oral, Q8H PRN, BHollice Espy MD, 5 mg at 12/20/17 2124 .  pantoprazole (PROTONIX) EC tablet 40 mg, 40 mg, Oral, Daily, SJodell Cipro Prasanna, MD, 40 mg at 12/21/17 1034 .  scopolamine (TRANSDERM-SCOP) 1 MG/3DAYS 1.5 mg, 1 patch, Transdermal, Q72H, Sridharan, Prasanna, MD, 1.5 mg at 12/19/17 1009 .  senna-docusate (Senokot-S) tablet 1 tablet, 1 tablet, Oral, QHS PRN, SJodell Cipro Prasanna, MD .  traZODone (DESYREL) tablet 25-50 mg, 25-50 mg, Oral, QHS PRN, SArta Silence MD, 50 mg at 12/21/17 2118   Physical exam:  Vitals:   12/21/17 0421 12/21/17 0744 12/21/17 1619 12/21/17 2026  BP: 139/85 136/83 (!) 143/80 119/85  Pulse: (!) 107 (!) 101 100 92  Resp: '20 18 18 14  '$ Temp: 99 F (37.2 C) 98.5 F (36.9 C) 98.1 F (36.7 C) 98.3 F (36.8 C)  TempSrc: Oral Oral Oral Oral  SpO2: 91% 92% 91% 93%  Weight:      Height:      Physical Exam  Constitutional: She is oriented to person, place, and time. No distress.  HENT:  Head: Normocephalic and atraumatic.  Mouth/Throat: Oropharynx is clear and moist.  Eyes: Pupils are equal, round, and reactive to light. EOM are normal. No scleral icterus.  Neck: Normal range of motion. Neck supple.  Cardiovascular: Normal rate, regular rhythm and normal heart sounds.  No murmur heard. Pulmonary/Chest: Effort normal. No respiratory distress. She has no wheezes.  Diminished right lung base breath sound.  Abdominal: Soft. Bowel sounds are normal. She exhibits no distension and no mass. There is  no tenderness.  Genitourinary:  Genitourinary Comments: Bilateral nephrostomy tubes  Musculoskeletal: Normal range of motion. She exhibits edema. She exhibits no deformity.  Neurological: She is alert and oriented to person, place, and time. No cranial nerve deficit. Coordination normal.  Skin: Skin is warm and dry. No rash noted. No erythema.  Psychiatric: She has a normal mood and affect. Her behavior is normal. Thought content normal.       CMP Latest Ref Rng & Units 12/21/2017  Glucose 70 - 99 mg/dL 117(H)  BUN 6 - 20 mg/dL 23(H)  Creatinine 0.44 - 1.00 mg/dL 2.16(H)  Sodium 135 - 145 mmol/L 139  Potassium 3.5 - 5.1 mmol/L 3.1(L)  Chloride 98 - 111 mmol/L 111  CO2 22 - 32 mmol/L 20(L)  Calcium 8.9 - 10.3 mg/dL 7.8(L)  Total Protein 6.5 - 8.1 g/dL -  Total Bilirubin 0.3 - 1.2 mg/dL -  Alkaline Phos 38 - 126 U/L -  AST 15 - 41 U/L -  ALT 0 - 44 U/L -   CBC Latest Ref Rng & Units 12/21/2017  WBC 3.6 - 11.0 K/uL 7.4  Hemoglobin 12.0 - 16.0 g/dL 10.5(L)  Hematocrit 35.0 - 47.0 % 29.0(L)  Platelets 150 - 440 K/uL 263   RADIOGRAPHIC STUDIES: I have personally reviewed the radiological images as listed and agreed with the findings in the report. Dg Chest 2 View  Result Date: 12/20/2017 CLINICAL DATA:  Shortness of breath. Weakness. EXAM: CHEST - 2 VIEW COMPARISON:  12/02/2011 FINDINGS: The cardiac silhouette is normal in size. Left chest Port-A-Cath has been removed. Surgical clips are present in the axillae. There are new moderate right and small left pleural effusions with atelectasis or consolidation in both lung bases, particularly medially. No pneumothorax is identified. No acute osseous abnormality is seen. IMPRESSION: New moderate right and small left pleural effusions with bibasilar atelectasis or consolidation. Electronically Signed   By: Logan Bores M.D.   On: 12/20/2017 13:43   Dg Cervical Spine Complete  Result Date: 11/22/2017 CLINICAL DATA:  Neck stiffness for 2 weeks.   Occipital headaches EXAM: CERVICAL SPINE - COMPLETE 4+ VIEW COMPARISON:  None similar FINDINGS: Focal C5-6 disc narrowing with mild endplate ridging and retrolisthesis. No evidence of fracture or bone lesion. No noted facet spurring or erosion. No prevertebral thickening. IMPRESSION: 1. Focal C5-6 disc degeneration. 2. No acute finding. Electronically Signed   By: Monte Fantasia M.D.   On: 11/22/2017 16:02   US Renal  Result Date: 12/18/2017 CLINICAL DATA:  Hydronephrosis aunt LOWER abdominal pain for 2-3 months. History of cystoscopy with bilateral stent placement 12/17/2017. EXAM: RENAL / URINARY TRACT ULTRASOUND COMPLETE COMPARISON:  CT of the abdomen and pelvis on 12/16/2017 FINDINGS: Right Kidney: Length: 12.1 centimeters. There is moderate hydronephrosis. Stent is visualized within the RIGHT renal pelvis. No solid or cystic renal mass. Left Kidney: Length: 12.7 centimeter. There is moderate hydronephrosis. The ureteral stent is not well seen in the LEFT renal pelvis. Bladder: Bladder is decompressed by a Foley catheter. Additional: There are bilateral pleural effusions.  Ascites. IMPRESSION: 1. Moderate bilateral hydronephrosis. 2. Foley catheter decompresses the bladder. 3. Bilateral pleural effusions.  Ascites. Electronically Signed   By: Nolon Nations M.D.   On: 12/18/2017 16:36   US Venous Img Lower Unilateral Left  Result Date: 12/16/2017 CLINICAL DATA:  Lower extremity edema EXAM: LEFT LOWER EXTREMITY VENOUS DUPLEX ULTRASOUND TECHNIQUE: Doppler venous  assessment of the left lower extremity deep venous system was performed, including characterization of spectral flow, compressibility, and phasicity. COMPARISON:  None. FINDINGS: There is complete compressibility of the left common femoral and femoral veins. The left popliteal vein is partially occlusive and contains partially occlusive DVT. Thrombus is mixed echogenicity with areas of hypoechoic and hyperechoic signal. There is occlusive  thrombus in the posterior tibial and peroneal veins. Doppler analysis demonstrates respiratory phasicity and augmentation. IMPRESSION: The study is positive for DVT in the popliteal, posterior tibial, and peroneal veins as described. Critical Value/emergent results were called by telephone at the time of interpretation on 12/16/2017 at 9:24 am to Dr. Alfred Levins, who verbally acknowledged these results. Electronically Signed   By: Marybelle Killings M.D.   On: 12/16/2017 09:25   Ct Renal Stone Study  Result Date: 12/16/2017 CLINICAL DATA:  57 year old female with abdominal pain. History of breast cancer and bilateral mastectomy. Patient is on chemo and radiation treatment. EXAM: CT ABDOMEN AND PELVIS WITHOUT CONTRAST TECHNIQUE: Multidetector CT imaging of the abdomen and pelvis was performed following the standard protocol without IV contrast. COMPARISON:  PET-CT dated 12/01/2011 FINDINGS: Evaluation of this exam is limited in the absence of intravenous contrast. Lower chest: Partially visualized small to moderate bilateral pleural effusions with associated partial compressive atelectasis of the adjacent lungs and areas of linear atelectasis/scarring. No intra-abdominal free air. Diffuse mesenteric edema and stranding and small ascites. Hepatobiliary: Subcentimeter hypodense focus in the right lobe of the liver is not characterized. The liver is otherwise unremarkable on this noncontrast CT. Probable tiny stone or sludge within the gallbladder. Pancreas: Unremarkable. No pancreatic ductal dilatation or surrounding inflammatory changes. Spleen: Normal in size without focal abnormality. Adrenals/Urinary Tract: Mild bilateral adrenal thickening. There is moderate bilateral hydronephrosis. The ureters are nondistended. The hydronephrosis is likely related to a degree of stricture at the ureteropelvic junctions secondary to retroperitoneal adhesions/fibrosis. No stone identified within the kidneys or along the course of the  ureters. There is masslike thickening of the anterior and left lateral bladder wall likely metastatic disease. Stomach/Bowel: There is diffuse thickening of the stomach which may be partly related to underdistention and ascites. Gastritis or an infiltrative process is not entirely excluded. There is no bowel obstruction. The appendix is normal as visualized. Vascular/Lymphatic: Mild aortoiliac atherosclerotic disease. The abdominal aorta and IVC are grossly unremarkable on this noncontrast CT. No portal venous gas. There are multiple somewhat small retroperitoneal and para-aortic lymph nodes. A mildly enlarged right iliac chain node measures 11 mm in short axis. There is diffuse mesenteric and omental nodularity and edema consistent with metastatic implants. Multiple top-normal and mildly rounded mesenteric lymph nodes measure up to 8 mm in short axis. Reproductive: Evaluation of the pelvic structures is very limited. The uterus appears retroflexed. A 3.3 x 3.9 x 2.6 cm ill-defined high attenuating solid lesion to the right of the uterine fundus is not well evaluated and may represent a fibroid or a complex lesion arising from the right adnexa. However, a neoplastic mass is not excluded. Pelvic ultrasound may provide better evaluation. Other: There is mild diffuse subcutaneous edema of the abdominal wall. Musculoskeletal: Multiple small scattered sclerotic lesions throughout the spine most consistent with metastatic disease. No acute fracture. IMPRESSION: 1. Moderate bilateral hydronephrosis, likely related to strictures at the level of the UPJ's, and likely secondary to retroperitoneal adhesions or fibrosis. No obstructing stone. 2. Masslike thickening of the anterior and left lateral bladder wall suspicious for metastatic implant. 3. Mesenteric, omental, and  peritoneal implants with a small ascites and diffuse mesenteric edema. 4. Small to moderate bilateral pleural effusions with associated compressive  atelectasis of the lungs. 5. Diffuse small osseous sclerotic metastatic disease. Electronically Signed   By: Anner Crete M.D.   On: 12/16/2017 06:53   Ir Nephrostomy Placement Left  Result Date: 12/20/2017 INDICATION: Bilateral ureteral obstruction with occluded ureteral stents EXAM: IR NEPHROSTOMY PLACEMENT LEFT COMPARISON:  None. MEDICATIONS: 2 g IV Ancef; The antibiotic was administered in an appropriate time frame prior to skin puncture. ANESTHESIA/SEDATION: Fentanyl 75 mcg IV; Versed 1 mg IV Moderate Sedation Time:  35 The patient was continuously monitored during the procedure by the interventional radiology nurse under my direct supervision. CONTRAST:  20m ISOVUE-300 IOPAMIDOL (ISOVUE-300) INJECTION 61% - administered into the collecting system(s) FLUOROSCOPY TIME:  Fluoroscopy Time: 5 minutes 48 seconds (356 mGy). COMPLICATIONS: None immediate. PROCEDURE: Informed written consent was obtained from the patient after a thorough discussion of the procedural risks, benefits and alternatives. All questions were addressed. Maximal Sterile Barrier Technique was utilized including caps, mask, sterile gowns, sterile gloves, sterile drape, hand hygiene and skin antiseptic. A timeout was performed prior to the initiation of the procedure. Utilizing 1% lidocaine as a local and deep anesthetic and fluoroscopic guidance, a 21 gauge needle was utilized to puncture the collecting system in the region of the left renal pelvis guided by a previously placed stent. Brown colored urine likely related to prior hemorrhage was withdrawn from the needle and contrast injection confirmed location within the collecting system. Subsequently a small amount of room air was injected to allow for delineation of posterior caliceal system. A lower pole posterior calyx was identified and again utilizing 1% xylocaine as local anesthetic and fluoroscopic guidance, a 20 gauge diamond tip needle was passed into the posterior calyx. Both  air and urine were withdrawn from the needle and subsequently a 0.018 platinum tip guidewire was advanced into the collecting system. Over this, a 6 [place] dilator was placed into the collecting system and a J wire coiled within the renal pelvis. The axis sick dilator was then removed and the tract dilated to 8 [place] The dilator was then removed and a 8 French pigtail catheter was placed over the wire and coiled within the renal pelvis. The catheter was initially placed adjacent to the body of the stent although no significant drainage of urine was noted. Contrast material was injected which demonstrates a significant amount of thrombus within the collecting system and for this reason the catheter was advanced into the superior aspect of the renal pelvis where adequate urine drainage was noted. The catheter was then sutured into place utilizing 0 Prolene and a Hopkins buttress. Catheter was placed external gravity drain and bloody urine was obtained. The patient tolerated the catheter placement well and attention was then turned to the right collecting system. IMPRESSION: Successful placement of an 8 French pigtail catheter within the left renal collecting system. Thrombus is noted within the collecting system which may limit drainage somewhat. This should resolve over time. Electronically Signed   By: MInez CatalinaM.D.   On: 12/20/2017 08:02   Ir Nephrostomy Placement Right  Result Date: 12/20/2017 INDICATION: Ureteral occlusion with occluded ureteral stents EXAM: IR NEPHROSTOMY PLACEMENT RIGHT COMPARISON:  None. MEDICATIONS: 2 g IV Ancef; The antibiotic was administered in an appropriate time frame prior to skin puncture. ANESTHESIA/SEDATION: Fentanyl 75 mcg IV; Versed 1 mg IV Moderate Sedation Time:  35 The patient was continuously monitored during the  procedure by the interventional radiology nurse under my direct supervision. CONTRAST:  51m ISOVUE-300 IOPAMIDOL (ISOVUE-300) INJECTION 61% -  administered into the collecting system(s) FLUOROSCOPY TIME:  Fluoroscopy Time: 5 minutes 48 seconds (356 mGy). COMPLICATIONS: None immediate. PROCEDURE: Informed written consent was obtained from the patient after a thorough discussion of the procedural risks, benefits and alternatives. All questions were addressed. Maximal Sterile Barrier Technique was utilized including caps, mask, sterile gowns, sterile gloves, sterile drape, hand hygiene and skin antiseptic. A timeout was performed prior to the initiation of the procedure. Utilizing 1% xylocaine as local anesthetic and fluoroscopic guidance a 21 gauge Chiba needle was placed into the right collecting system utilizing the pigtail from the previously placed ureteral stent as a guide. Brown tinged urine was withdrawn from the needle and subsequently a small amount of contrast was instilled to confirm needle placement. This demonstrates dilated collecting system. Subsequently a small amount of room air was instilled to allow for delineation of a posterior calyx. Again utilizing 1% xylocaine as local anesthetic and fluoroscopic guidance, a 20 gauge diamond tip needle was placed into the upper pole posterior calyx without difficulty. A 0.018 platinum tip guidewire was then advanced into the collecting system and coiled within the renal pelvis. A 6 French Accustick dilator was then placed over the microwire into the collecting system. The central dilator in stiff near were then removed and a J tipped guidewire was coiled within the renal pelvis. Dilatation to 10 [place] then performed and a 10 French nephrostomy catheter placed over the guidewire into the renal pelvis. Brisk return of bloody urine was then noted. The catheter was then attached to an external gravity bag. The catheter was sutured into place utilizing 0 Prolene and a Hopkins buttress. Patient tolerated the procedure well and was returned to her room in satisfactory condition. IMPRESSION: Successful  right percutaneous nephrostomy placement as described above. Bloody urine was noted at the termination of the procedure which should clear over time. Electronically Signed   By: MInez CatalinaM.D.   On: 12/20/2017 08:06    Assessment and plan-  Patient is a 57y.o. female  female with history of Stage III right breast cancer, ER/PR positive and HER2 negative, s/p chemotherapy, B/L mastectomy, adjuvant RT and on extended Aromasin and Zolodex presents with abdominal pain  #Acute renal failure, creatinine trended down.  Continue gentle hydration.  Status post bilateral nephrostomy tubes. # Hypercalcemia, stable calcium.  #Left lower extremity below-knee acute DVT, in the context of widely metastatic malignancy,  switched to Eliquis at discharge.   #Stage IV metastatic breast cancer, with visceral crisis. Recommend starting palliative chemotherapy as soon as possible in order to achieve quick tumor burden reduction. Given patient's current impaired kidney function, previous anthracycline use, recommend Taxol treatment.  I explained to the patient the risks and benefits of chemotherapy including all but not limited to hair loss, mouth sore, nausea, vomiting, low blood counts, bleeding, and risk of life threatening infection and even death, neuropathy secondary malignancy etc.   Patient voices understanding and willing to proceed chemotherapy.  To avoid further delay outpatient, recommend starting cycle 1 palliative chemotherapy with Taxol tomorrow.  Plan 2-3 cycles of chemotherapy and switch to endocrine treatment/ CDK 4/6 treatment.   Goal of care was discussed with patient again.  Patient's disease is not curable.  Treatment is with palliative intent. Discussed with oncology floor charge nurse and cancer center pharmacist. After she is discharged, I will schedule patient to obtain Mediport  placement, take chemotherapy education class.  We will also arrange MRI brain outpatient as likely MRI cannot  be done due to impaired kidney function during this admission.  #Pleural effusion, right more than left.  Currently patient is asymptomatic saturation well on room air.  Continue monitor.   Thank you for allowing me to participate in the care of this patient.   Earlie Server, MD, PhD Hematology Oncology [facility] at [facility] Pager- [phone] 12/21/2017

## 2017-12-21 NOTE — Progress Notes (Signed)
START ON PATHWAY REGIMEN - Breast     A cycle is every 28 days (3 weeks on and 1 week off):     Paclitaxel   **Always confirm dose/schedule in your pharmacy ordering system**  Patient Characteristics: Distant Metastases or Locoregional Recurrent Disease - Unresected or Locally Advanced Unresectable Disease Progressing after Neoadjuvant and Local Therapies, HER2 Negative/Unknown/Equivocal, ER Positive, Chemotherapy, First Line Therapeutic Status: Distant Metastases BRCA Mutation Status: Awaiting Test Results ER Status: Positive (+) HER2 Status: Negative (-) PR Status: Positive (+) Line of Therapy: First Line Intent of Therapy: Non-Curative / Palliative Intent, Discussed with Patient

## 2017-12-21 NOTE — Progress Notes (Signed)
Patient ID: [name], female   DOB: 1960/04/18, 57 y.o.   MRN: [id number]  [employer] PROGRESS NOTE  DENYCE HARR XLK:[id number] DOB: [birth year] DOA: 12/16/2017 PCP: [name], MD  HPI/Subjective: Patient feeling better.  Offers no complaints.  Had quite a few bowel movements overnight.  Objective: Vitals:   12/21/17 0744 12/21/17 1619  BP: 136/83 (!) 143/80  Pulse: (!) 101 100  Resp: 18 18  Temp: 98.5 F (36.9 C) 98.1 F (36.7 C)  SpO2: 92% 91%    Filed Weights   12/16/17 0318 12/16/17 1225  Weight: 72.6 kg 72.5 kg    ROS: Review of Systems  Constitutional: Negative for chills and fever.  Eyes: Negative for blurred vision.  Respiratory: Negative for cough and shortness of breath.   Cardiovascular: Negative for chest pain.  Gastrointestinal: Negative for abdominal pain, constipation, diarrhea, nausea and vomiting.  Genitourinary: Negative for dysuria.  Musculoskeletal: Negative for joint pain.  Neurological: Positive for dizziness. Negative for headaches.   Exam: Physical Exam  Constitutional: She is oriented to person, place, and time.  HENT:  Nose: No mucosal edema.  Mouth/Throat: No oropharyngeal exudate or posterior oropharyngeal edema.  Eyes: Pupils are equal, round, and reactive to light. Conjunctivae, EOM and lids are normal.  Neck: No JVD present. Carotid bruit is not present. No edema present. No thyroid mass and no thyromegaly present.  Cardiovascular: S1 normal and S2 normal. Exam reveals no gallop.  No murmur heard. Pulses:      Dorsalis pedis pulses are 2+ on the right side, and 2+ on the left side.  Respiratory: No respiratory distress. She has no wheezes. She has no rhonchi. She has no rales.  GI: Soft. Bowel sounds are normal. She exhibits distension. There is generalized tenderness.  Musculoskeletal:       Right ankle: She exhibits swelling.       Left ankle: She exhibits swelling.  Lymphadenopathy:    She has no cervical  adenopathy.  Neurological: She is alert and oriented to person, place, and time. No cranial nerve deficit.  Skin: Skin is warm. No rash noted. Nails show no clubbing.  Psychiatric: She has a normal mood and affect.      Data Reviewed: Basic Metabolic Panel: Recent Labs  Lab 12/16/17 0332  12/17/17 0458 12/18/17 0649 12/19/17 0620 12/20/17 0415 12/21/17 0552  NA 139  --  139 141 137 139 139  K 3.0*  --  3.0* 3.3* 3.2* 3.2* 3.1*  CL 102  --  105 109 109 110 111  CO2 28  --  26 23 20* 17* 20*  GLUCOSE 113*  --  106* 113* 133* 119* 117*  BUN 30*  --  19 22* 30* 33* 23*  CREATININE 1.63*  --  1.40* 2.31* 4.78* 4.27* 2.16*  CALCIUM 13.9*   < > 10.8* 9.6 8.3* 8.0* 7.8*  MG 2.1  --   --   --   --   --   --   PHOS 3.1  --   --   --   --   --   --    < > = values in this interval not displayed.   Liver Function Tests: Recent Labs  Lab 12/15/17 1525 12/16/17 0332  AST 36* 39  ALT 37* 43  ALKPHOS  --  110  BILITOT 0.3 0.6  PROT 6.4 6.9  ALBUMIN  --  3.8   CBC: Recent Labs  Lab 12/17/17 0458 12/18/17 0649 12/19/17  6222 12/20/17 0415 12/21/17 0552  WBC 7.0 10.3 9.2 9.4 7.4  HGB 11.8* 11.7* 10.8* 11.3* 10.5*  HCT 33.4* 33.8* 31.4* 32.3* 29.0*  MCV 91.5 93.2 93.0 93.0 92.3  PLT 205 230 217 238 263     Studies: Dg Chest 2 View  Result Date: 12/20/2017 CLINICAL DATA:  Shortness of breath. Weakness. EXAM: CHEST - 2 VIEW COMPARISON:  12/02/2011 FINDINGS: The cardiac silhouette is normal in size. Left chest Port-A-Cath has been removed. Surgical clips are present in the axillae. There are new moderate right and small left pleural effusions with atelectasis or consolidation in both lung bases, particularly medially. No pneumothorax is identified. No acute osseous abnormality is seen. IMPRESSION: New moderate right and small left pleural effusions with bibasilar atelectasis or consolidation. Electronically Signed   By: Logan Bores M.D.   On: 12/20/2017 13:43   Ir Nephrostomy  Placement Left  Result Date: 12/20/2017 INDICATION: Bilateral ureteral obstruction with occluded ureteral stents EXAM: IR NEPHROSTOMY PLACEMENT LEFT COMPARISON:  None. MEDICATIONS: 2 g IV Ancef; The antibiotic was administered in an appropriate time frame prior to skin puncture. ANESTHESIA/SEDATION: Fentanyl 75 mcg IV; Versed 1 mg IV Moderate Sedation Time:  35 The patient was continuously monitored during the procedure by the interventional radiology nurse under my direct supervision. CONTRAST:  55mL ISOVUE-300 IOPAMIDOL (ISOVUE-300) INJECTION 61% - administered into the collecting system(s) FLUOROSCOPY TIME:  Fluoroscopy Time: 5 minutes 48 seconds (356 mGy). COMPLICATIONS: None immediate. PROCEDURE: Informed written consent was obtained from the patient after a thorough discussion of the procedural risks, benefits and alternatives. All questions were addressed. Maximal Sterile Barrier Technique was utilized including caps, mask, sterile gowns, sterile gloves, sterile drape, hand hygiene and skin antiseptic. A timeout was performed prior to the initiation of the procedure. Utilizing 1% lidocaine as a local and deep anesthetic and fluoroscopic guidance, a 21 gauge needle was utilized to puncture the collecting system in the region of the left renal pelvis guided by a previously placed stent. Brown colored urine likely related to prior hemorrhage was withdrawn from the needle and contrast injection confirmed location within the collecting system. Subsequently a small amount of room air was injected to allow for delineation of posterior caliceal system. A lower pole posterior calyx was identified and again utilizing 1% xylocaine as local anesthetic and fluoroscopic guidance, a 20 gauge diamond tip needle was passed into the posterior calyx. Both air and urine were withdrawn from the needle and subsequently a 0.018 platinum tip guidewire was advanced into the collecting system. Over this, a 6 [place] Accustick dilator  was placed into the collecting system and a J wire coiled within the renal pelvis. The axis sick dilator was then removed and the tract dilated to 8 [place]. The dilator was then removed and a 8 French pigtail catheter was placed over the wire and coiled within the renal pelvis. The catheter was initially placed adjacent to the body of the stent although no significant drainage of urine was noted. Contrast material was injected which demonstrates a significant amount of thrombus within the collecting system and for this reason the catheter was advanced into the superior aspect of the renal pelvis where adequate urine drainage was noted. The catheter was then sutured into place utilizing 0 Prolene and a Hopkins buttress. Catheter was placed external gravity drain and bloody urine was obtained. The patient tolerated the catheter placement well and attention was then turned to the right collecting system. IMPRESSION: Successful placement of an 8 [place] pigtail  catheter within the left renal collecting system. Thrombus is noted within the collecting system which may limit drainage somewhat. This should resolve over time. Electronically Signed   By: Inez Catalina M.D.   On: 12/20/2017 08:02   Ir Nephrostomy Placement Right  Result Date: 12/20/2017 INDICATION: Ureteral occlusion with occluded ureteral stents EXAM: IR NEPHROSTOMY PLACEMENT RIGHT COMPARISON:  None. MEDICATIONS: 2 g IV Ancef; The antibiotic was administered in an appropriate time frame prior to skin puncture. ANESTHESIA/SEDATION: Fentanyl 75 mcg IV; Versed 1 mg IV Moderate Sedation Time:  35 The patient was continuously monitored during the procedure by the interventional radiology nurse under my direct supervision. CONTRAST:  49mL ISOVUE-300 IOPAMIDOL (ISOVUE-300) INJECTION 61% - administered into the collecting system(s) FLUOROSCOPY TIME:  Fluoroscopy Time: 5 minutes 48 seconds (356 mGy). COMPLICATIONS: None immediate. PROCEDURE: Informed written consent  was obtained from the patient after a thorough discussion of the procedural risks, benefits and alternatives. All questions were addressed. Maximal Sterile Barrier Technique was utilized including caps, mask, sterile gowns, sterile gloves, sterile drape, hand hygiene and skin antiseptic. A timeout was performed prior to the initiation of the procedure. Utilizing 1% xylocaine as local anesthetic and fluoroscopic guidance a 21 gauge Chiba needle was placed into the right collecting system utilizing the pigtail from the previously placed ureteral stent as a guide. Brown tinged urine was withdrawn from the needle and subsequently a small amount of contrast was instilled to confirm needle placement. This demonstrates dilated collecting system. Subsequently a small amount of room air was instilled to allow for delineation of a posterior calyx. Again utilizing 1% xylocaine as local anesthetic and fluoroscopic guidance, a 20 gauge diamond tip needle was placed into the upper pole posterior calyx without difficulty. A 0.018 platinum tip guidewire was then advanced into the collecting system and coiled within the renal pelvis. A 6 French Accustick dilator was then placed over the microwire into the collecting system. The central dilator in stiff near were then removed and a J tipped guidewire was coiled within the renal pelvis. Dilatation to 10 [place] was then performed and a 10 French nephrostomy catheter placed over the guidewire into the renal pelvis. Brisk return of bloody urine was then noted. The catheter was then attached to an external gravity bag. The catheter was sutured into place utilizing 0 Prolene and a Hopkins buttress. Patient tolerated the procedure well and was returned to her room in satisfactory condition. IMPRESSION: Successful right percutaneous nephrostomy placement as described above. Bloody urine was noted at the termination of the procedure which should clear over time. Electronically Signed   By:  Inez Catalina M.D.   On: 12/20/2017 08:06    Scheduled Meds: . amLODipine  5 mg Oral Daily  . exemestane  25 mg Oral QPC breakfast  . Influenza vac split quadrivalent PF  0.5 mL Intramuscular Tomorrow-1000  . pantoprazole  40 mg Oral Daily  . polyethylene glycol  17 g Oral q1800  . scopolamine  1 patch Transdermal Q72H   Continuous Infusions: . heparin 1,400 Units/hr (12/20/17 2106)    Assessment/Plan:  1. Acute kidney injury.  Creatinine improved to 2.16 today.  Nephrostomy tubes help out with urine drainage for obstructive nephropathy.  Creatinine hopefully will normalize tomorrow 2. Severe hypercalcemia on presentation.  Calcium improved to normal range.  Status post Zometa. 3. Metastatic breast cancer, to bone and bladder wall and peritoneum.  Follow-up with oncology.  Goal of treatment is now palliative in nature since the patient does have  metastatic cancer.  Dr. Tasia Catchings may give chemo while here tomorrow.  Husband had many questions about prognosis. 4. Acute DVT left lower extremity.  On heparin drip.  Hopefully will be able to switch over to Eliquis upon disposition. 5. Dizziness likely inner ear issue.  Tympanic membrane normal bilaterally.  MRI brain with contrast unable to be done at this time.. 6. Weakness.  Physical therapy appreciated 7. Hypertension on norvasc 8. Constipation.  Resolved  Code Status:     Code Status Orders  (From admission, onward)         Start     Ordered   12/16/17 1248  Full code  Continuous     12/16/17 1247        Code Status History    Date Active Date Inactive Code Status Order ID Comments User Context   05/18/2012 1813 05/20/2012 1306 Full Code [id number]  Adin Hector, MD Inpatient    Advance Directive Documentation     Most Recent Value  Type of Advance Directive  Living will  Pre-existing out of facility DNR order (yellow form or pink MOST form)  -  "MOST" Form in Place?  -     Family Communication: Husband at  bedside Disposition Plan: If oncology gets chemotherapy will have to wait and see when to discharge.  If no chemotherapy plans potentially can go home tomorrow.  Consultants:  Urology  Oncology  Nephrology  Time spent: 35 minutes, including ACP time  [employer]

## 2017-12-21 NOTE — Progress Notes (Signed)
Urology Consult Follow Up  Subjective: Patient resting in bed.  She has been able to have bowel movements.  Her serum creatinine is now at 2.16.  Good UOP.    Anti-infectives: Anti-infectives (From admission, onward)   Start     Dose/Rate Route Frequency Ordered Stop   12/19/17 1645  ceFAZolin (ANCEF) IVPB 1 g/50 mL premix     1 g 100 mL/hr over 30 Minutes Intravenous On call 12/19/17 1636 12/19/17 1725   12/17/17 0600  ceFAZolin (ANCEF) IVPB 2g/100 mL premix     2 g 200 mL/hr over 30 Minutes Intravenous  Once 12/16/17 1516 12/17/17 0605      Current Facility-Administered Medications  Medication Dose Route Frequency Provider Last Rate Last Dose  . 0.9 %  sodium chloride infusion   Intravenous Continuous Loletha Grayer, MD 40 mL/hr at 12/20/17 2114    . acetaminophen (TYLENOL) tablet 650 mg  650 mg Oral Q6H PRN Arta Silence, MD   650 mg at 12/19/17 1006   Or  . acetaminophen (TYLENOL) suppository 650 mg  650 mg Rectal Q6H PRN Arta Silence, MD      . amLODipine (NORVASC) tablet 5 mg  5 mg Oral Daily Arta Silence, MD   5 mg at 12/20/17 0849  . bisacodyl (DULCOLAX) EC tablet 5 mg  5 mg Oral Daily PRN Arta Silence, MD   5 mg at 12/19/17 1006  . diazepam (VALIUM) tablet 10 mg  10 mg Oral QHS PRN Arta Silence, MD      . exemestane (AROMASIN) tablet 25 mg  25 mg Oral QPC breakfast Arta Silence, MD   25 mg at 12/20/17 0849  . heparin ADULT infusion 100 units/mL (25000 units/231mL sodium chloride 0.45%)  1,400 Units/hr Intravenous Continuous Epifanio Lesches, MD 14 mL/hr at 12/20/17 2106 1,400 Units/hr at 12/20/17 2106  . HYDROmorphone (DILAUDID) injection 0.5-1 mg  0.5-1 mg Intravenous Q3H PRN Arta Silence, MD   1 mg at 12/20/17 1646  . Influenza vac split quadrivalent PF (FLUARIX) injection 0.5 mL  0.5 mL Intramuscular Tomorrow-1000 Epifanio Lesches, MD      . lactulose (CHRONULAC) 10 GM/15ML solution 30 g  30 g Oral Daily PRN Wieting,  Richard, MD      . ondansetron [facility]) tablet 4 mg  4 mg Oral Q6H PRN Arta Silence, MD       Or  . ondansetron (ZOFRAN) injection 4 mg  4 mg Intravenous Q6H PRN Arta Silence, MD   4 mg at 12/21/17 0427  . opium-belladonna (B&O SUPPRETTES) 16.2-60 MG suppository 1 suppository  1 suppository Rectal Q8H PRN Hollice Espy, MD      . oxybutynin (DITROPAN) tablet 5 mg  5 mg Oral Q8H PRN Hollice Espy, MD   5 mg at 12/20/17 2124  . pantoprazole (PROTONIX) EC tablet 40 mg  40 mg Oral Daily Arta Silence, MD   40 mg at 12/20/17 0849  . polyethylene glycol (MIRALAX / GLYCOLAX) packet 17 g  17 g Oral q1800 Loletha Grayer, MD   17 g at 12/19/17 1833  . potassium chloride SA (K-DUR,KLOR-CON) CR tablet 40 mEq  40 mEq Oral Once Loletha Grayer, MD      . scopolamine (TRANSDERM-SCOP) 1 MG/3DAYS 1.5 mg  1 patch Transdermal Q72H Arta Silence, MD   1.5 mg at 12/19/17 1009  . senna-docusate (Senokot-S) tablet 1 tablet  1 tablet Oral QHS PRN Arta Silence, MD      . traZODone (DESYREL) tablet 25-50 mg  25-50 mg Oral QHS  PRN Arta Silence, MD         Objective: Vital signs in last 24 hours: Temp:  [97.5 F (36.4 C)-99 F (37.2 C)] 99 F (37.2 C) (10/03 0421) Pulse Rate:  [96-107] 107 (10/03 0421) Resp:  [16-20] 20 (10/03 0421) BP: (137-166)/(76-85) 139/85 (10/03 0421) SpO2:  [91 %-93 %] 91 % (10/03 0421)  Intake/Output from previous day: 10/02 0701 - 10/03 0700 In: 954.7 [I.V.:954.7] Out: 3300 [Urine:3300] Intake/Output this shift: No intake/output data recorded.   Physical Exam Constitutional: Well nourished. Alert and oriented, No acute distress. HEENT: [place] AT, moist mucus membranes. Trachea midline, no masses. Cardiovascular: No clubbing, cyanosis, or edema. Respiratory: Normal respiratory effort, no increased work of breathing. GI: Abdomen is soft, non tender, non distended, no abdominal masses.  GU: No CVA tenderness.  No bladder fullness or  masses.  Right nephrostomy tube dressing is clean and dry draining light pink urine.  Left nephrostomy tube dressing with a quarter size of dried serosanguineous fluid draining light pink urine.  Skin: No rashes, bruises or suspicious lesions. Lymph: No cervical or inguinal adenopathy. Neurologic: Grossly intact, no focal deficits, moving all 4 extremities. Psychiatric: Normal mood and affect.  Lab Results:  Recent Labs    12/20/17 0415 12/21/17 0552  WBC 9.4 7.4  HGB 11.3* 10.5*  HCT 32.3* 29.0*  PLT 238 263   BMET Recent Labs    12/20/17 0415 12/21/17 0552  NA 139 139  K 3.2* 3.1*  CL 110 111  CO2 17* 20*  GLUCOSE 119* 117*  BUN 33* 23*  CREATININE 4.27* 2.16*  CALCIUM 8.0* 7.8*   PT/INR No results for input(s): LABPROT, INR in the last 72 hours. ABG No results for input(s): PHART, HCO3 in the last 72 hours.  Invalid input(s): PCO2, PO2  Studies/Results: Dg Chest 2 View  Result Date: 12/20/2017 CLINICAL DATA:  Shortness of breath. Weakness. EXAM: CHEST - 2 VIEW COMPARISON:  12/02/2011 FINDINGS: The cardiac silhouette is normal in size. Left chest Port-A-Cath has been removed. Surgical clips are present in the axillae. There are new moderate right and small left pleural effusions with atelectasis or consolidation in both lung bases, particularly medially. No pneumothorax is identified. No acute osseous abnormality is seen. IMPRESSION: New moderate right and small left pleural effusions with bibasilar atelectasis or consolidation. Electronically Signed   By: Logan Bores M.D.   On: 12/20/2017 13:43   Ir Nephrostomy Placement Left  Result Date: 12/20/2017 INDICATION: Bilateral ureteral obstruction with occluded ureteral stents EXAM: IR NEPHROSTOMY PLACEMENT LEFT COMPARISON:  None. MEDICATIONS: 2 g IV Ancef; The antibiotic was administered in an appropriate time frame prior to skin puncture. ANESTHESIA/SEDATION: Fentanyl 75 mcg IV; Versed 1 mg IV Moderate Sedation Time:  35  The patient was continuously monitored during the procedure by the interventional radiology nurse under my direct supervision. CONTRAST:  57mL ISOVUE-300 IOPAMIDOL (ISOVUE-300) INJECTION 61% - administered into the collecting system(s) FLUOROSCOPY TIME:  Fluoroscopy Time: 5 minutes 48 seconds (356 mGy). COMPLICATIONS: None immediate. PROCEDURE: Informed written consent was obtained from the patient after a thorough discussion of the procedural risks, benefits and alternatives. All questions were addressed. Maximal Sterile Barrier Technique was utilized including caps, mask, sterile gowns, sterile gloves, sterile drape, hand hygiene and skin antiseptic. A timeout was performed prior to the initiation of the procedure. Utilizing 1% lidocaine as a local and deep anesthetic and fluoroscopic guidance, a 21 gauge needle was utilized to puncture the collecting system in the region of the left  renal pelvis guided by a previously placed stent. Brown colored urine likely related to prior hemorrhage was withdrawn from the needle and contrast injection confirmed location within the collecting system. Subsequently a small amount of room air was injected to allow for delineation of posterior caliceal system. A lower pole posterior calyx was identified and again utilizing 1% xylocaine as local anesthetic and fluoroscopic guidance, a 20 gauge diamond tip needle was passed into the posterior calyx. Both air and urine were withdrawn from the needle and subsequently a 0.018 platinum tip guidewire was advanced into the collecting system. Over this, a 6 [place] Accustick dilator was placed into the collecting system and a J wire coiled within the renal pelvis. The axis sick dilator was then removed and the tract dilated to 8 [place]. The dilator was then removed and a 8 French pigtail catheter was placed over the wire and coiled within the renal pelvis. The catheter was initially placed adjacent to the body of the stent although no  significant drainage of urine was noted. Contrast material was injected which demonstrates a significant amount of thrombus within the collecting system and for this reason the catheter was advanced into the superior aspect of the renal pelvis where adequate urine drainage was noted. The catheter was then sutured into place utilizing 0 Prolene and a Hopkins buttress. Catheter was placed external gravity drain and bloody urine was obtained. The patient tolerated the catheter placement well and attention was then turned to the right collecting system. IMPRESSION: Successful placement of an 8 French pigtail catheter within the left renal collecting system. Thrombus is noted within the collecting system which may limit drainage somewhat. This should resolve over time. Electronically Signed   By: Inez Catalina M.D.   On: 12/20/2017 08:02   Ir Nephrostomy Placement Right  Result Date: 12/20/2017 INDICATION: Ureteral occlusion with occluded ureteral stents EXAM: IR NEPHROSTOMY PLACEMENT RIGHT COMPARISON:  None. MEDICATIONS: 2 g IV Ancef; The antibiotic was administered in an appropriate time frame prior to skin puncture. ANESTHESIA/SEDATION: Fentanyl 75 mcg IV; Versed 1 mg IV Moderate Sedation Time:  35 The patient was continuously monitored during the procedure by the interventional radiology nurse under my direct supervision. CONTRAST:  32mL ISOVUE-300 IOPAMIDOL (ISOVUE-300) INJECTION 61% - administered into the collecting system(s) FLUOROSCOPY TIME:  Fluoroscopy Time: 5 minutes 48 seconds (356 mGy). COMPLICATIONS: None immediate. PROCEDURE: Informed written consent was obtained from the patient after a thorough discussion of the procedural risks, benefits and alternatives. All questions were addressed. Maximal Sterile Barrier Technique was utilized including caps, mask, sterile gowns, sterile gloves, sterile drape, hand hygiene and skin antiseptic. A timeout was performed prior to the initiation of the procedure.  Utilizing 1% xylocaine as local anesthetic and fluoroscopic guidance a 21 gauge Chiba needle was placed into the right collecting system utilizing the pigtail from the previously placed ureteral stent as a guide. Brown tinged urine was withdrawn from the needle and subsequently a small amount of contrast was instilled to confirm needle placement. This demonstrates dilated collecting system. Subsequently a small amount of room air was instilled to allow for delineation of a posterior calyx. Again utilizing 1% xylocaine as local anesthetic and fluoroscopic guidance, a 20 gauge diamond tip needle was placed into the upper pole posterior calyx without difficulty. A 0.018 platinum tip guidewire was then advanced into the collecting system and coiled within the renal pelvis. A 6 French Accustick dilator was then placed over the microwire into the collecting system. The central dilator in  stiff near were then removed and a J tipped guidewire was coiled within the renal pelvis. Dilatation to 10 [place] was then performed and a 10 French nephrostomy catheter placed over the guidewire into the renal pelvis. Brisk return of bloody urine was then noted. The catheter was then attached to an external gravity bag. The catheter was sutured into place utilizing 0 Prolene and a Hopkins buttress. Patient tolerated the procedure well and was returned to her room in satisfactory condition. IMPRESSION: Successful right percutaneous nephrostomy placement as described above. Bloody urine was noted at the termination of the procedure which should clear over time. Electronically Signed   By: Inez Catalina M.D.   On: 12/20/2017 08:06     Assessment and Plan: Patient with stage III breast cancer that is now diffusely metastasized involving retroperitoneum, omentum, bone, possibly liver and possible bladder with bilateral ureteral obstruction  1.  Bilateral hydroureteronephrosis S/p bilateral stent placement with bladder biopsies on  12/17/2017  Patient underwent bilateral nephrostomy tube placement 12/19/2017 Bladder biopsies consistent with metastatic breast cancer UOP with the nephrostomy tubes is good Creatinine is starting to trend down       LOS: 5 days    [facility] 12/21/2017

## 2017-12-21 NOTE — Progress Notes (Signed)
**Note Mary-Identified via Obfuscation** Patient ID: Mary [name], female   DOB: [birth year], 57 y.o.   MRN: [id number]  ACP note  Patient and husband at the bedside  Diagnosis: Metastatic breast cancer, obstructive nephropathy, acute kidney injury, nephrostomy tubes  Husband asking numerous questions about prognosis and survival time.  I tried to answer the questions as best as I could.  I mentioned that any sort of treatment will be palliative in nature trying to slow the disease down.  This will not be a curative treatment.  Patient a full code.  Time spent on ACP discussion 20 minutes Dr. Loletha Grayer

## 2017-12-21 NOTE — Progress Notes (Signed)
[facility]  ROUNDING NOTE   Subjective:     Objective:  Vital signs in last 24 hours:  Temp:  [98.4 F (36.9 C)-99 F (37.2 C)] 98.5 F (36.9 C) (10/03 0744) Pulse Rate:  [101-107] 101 (10/03 0744) Resp:  [16-20] 18 (10/03 0744) BP: (136-166)/(76-85) 136/83 (10/03 0744) SpO2:  [91 %-93 %] 92 % (10/03 0744)  Weight change:  Filed Weights   12/16/17 0318 12/16/17 1225  Weight: 72.6 kg 72.5 kg    Intake/Output: I/O last 3 completed shifts: In: 1501.2 [I.V.:1501.2] Out: 4850 [Urine:4850]   Intake/Output this shift:  Total I/O In: 360 [P.O.:360] Out: 1250 [Urine:1250]  Physical Exam: General: No acute distress  Head: Normocephalic, atraumatic. Moist oral mucosal membranes  Eyes: Anicteric  Neck: Supple, trachea midline  Lungs:  Clear to auscultation, normal effort  Heart: S1S2 no rubs  Abdomen:  Soft, nontender, bowel sounds present  Extremities:  peripheral edema.  Neurologic: Awake, alert, following commands  Skin: No lesions  Access:     Basic Metabolic Panel: Recent Labs  Lab 12/16/17 0332  12/17/17 0458 12/18/17 0649 12/19/17 0620 12/20/17 0415 12/21/17 0552  NA 139  --  139 141 137 139 139  K 3.0*  --  3.0* 3.3* 3.2* 3.2* 3.1*  CL 102  --  105 109 109 110 111  CO2 28  --  26 23 20* 17* 20*  GLUCOSE 113*  --  106* 113* 133* 119* 117*  BUN 30*  --  19 22* 30* 33* 23*  CREATININE 1.63*  --  1.40* 2.31* 4.78* 4.27* 2.16*  CALCIUM 13.9*   < > 10.8* 9.6 8.3* 8.0* 7.8*  MG 2.1  --   --   --   --   --   --   PHOS 3.1  --   --   --   --   --   --    < > = values in this interval not displayed.    Liver Function Tests: Recent Labs  Lab 12/15/17 1525 12/16/17 0332  AST 36* 39  ALT 37* 43  ALKPHOS  --  110  BILITOT 0.3 0.6  PROT 6.4 6.9  ALBUMIN  --  3.8   No results for input(s): LIPASE, AMYLASE in the last 168 hours. No results for input(s): AMMONIA in the last 168 hours.  CBC: Recent Labs  Lab 12/17/17 0458 12/18/17 0649  12/19/17 0620 12/20/17 0415 12/21/17 0552  WBC 7.0 10.3 9.2 9.4 7.4  HGB 11.8* 11.7* 10.8* 11.3* 10.5*  HCT 33.4* 33.8* 31.4* 32.3* 29.0*  MCV 91.5 93.2 93.0 93.0 92.3  PLT 205 230 217 238 263    Cardiac Enzymes: No results for input(s): CKTOTAL, CKMB, CKMBINDEX, TROPONINI in the last 168 hours.  BNP: Invalid input(s): POCBNP  CBG: No results for input(s): GLUCAP in the last 168 hours.  Microbiology: Results for orders placed or performed in visit on 11/10/15  CULTURE, URINE COMPREHENSIVE     Status: None   Collection Time: 11/10/15  2:41 PM  Result Value Ref Range Status   Culture ESCHERICHIA COLI  Final   Colony Count >=100,000 COLONIES/ML  Final   Organism ID, Bacteria ESCHERICHIA COLI  Final      Susceptibility   Escherichia coli -  (no method available)    AMPICILLIN 4 Sensitive     AMOX/CLAVULANIC <=2 Sensitive     AMPICILLIN/SULBACTAM <=2 Sensitive     PIP/TAZO <=4 Sensitive     IMIPENEM <=0.25 Sensitive  CEFAZOLIN  Sensitive     CEFTRIAXONE <=1 Sensitive     CEFTAZIDIME <=1 Sensitive     CEFEPIME <=1 Sensitive     GENTAMICIN <=1 Sensitive     TOBRAMYCIN <=1 Sensitive     CIPROFLOXACIN <=0.25 Sensitive     LEVOFLOXACIN <=0.12 Sensitive     NITROFURANTOIN <=16 Sensitive     TRIMETH/SULFA* <=20 Sensitive      * NR=NOT REPORTABLE,SEE COMMENTORAL therapy:A cefazolin MIC of <32 predicts susceptibility to the oral agents cefaclor,cefdinir,cefpodoxime,cefprozil,cefuroxime,cephalexin,and loracarbef when used for therapy of uncomplicated UTIs due to E.coli,K.pneumomiae,and P.mirabilis. PARENTERAL therapy: A cefazolinMIC of >8 indicates resistance to parenteralcefazolin. An alternate test method must beperformed to confirm susceptibility to parenteralcefazolin.    Coagulation Studies: No results for input(s): LABPROT, INR in the last 72 hours.  Urinalysis: No results for input(s): COLORURINE, LABSPEC, PHURINE, GLUCOSEU, HGBUR, BILIRUBINUR, KETONESUR, PROTEINUR,  UROBILINOGEN, NITRITE, LEUKOCYTESUR in the last 72 hours.  Invalid input(s): APPERANCEUR    Imaging: Dg Chest 2 View  Result Date: 12/20/2017 CLINICAL DATA:  Shortness of breath. Weakness. EXAM: CHEST - 2 VIEW COMPARISON:  12/02/2011 FINDINGS: The cardiac silhouette is normal in size. Left chest Port-A-Cath has been removed. Surgical clips are present in the axillae. There are new moderate right and small left pleural effusions with atelectasis or consolidation in both lung bases, particularly medially. No pneumothorax is identified. No acute osseous abnormality is seen. IMPRESSION: New moderate right and small left pleural effusions with bibasilar atelectasis or consolidation. Electronically Signed   By: Logan Bores M.D.   On: 12/20/2017 13:43   Ir Nephrostomy Placement Left  Result Date: 12/20/2017 INDICATION: Bilateral ureteral obstruction with occluded ureteral stents EXAM: IR NEPHROSTOMY PLACEMENT LEFT COMPARISON:  None. MEDICATIONS: 2 g IV Ancef; The antibiotic was administered in an appropriate time frame prior to skin puncture. ANESTHESIA/SEDATION: Fentanyl 75 mcg IV; Versed 1 mg IV Moderate Sedation Time:  35 The patient was continuously monitored during the procedure by the interventional radiology nurse under my direct supervision. CONTRAST:  43mL ISOVUE-300 IOPAMIDOL (ISOVUE-300) INJECTION 61% - administered into the collecting system(s) FLUOROSCOPY TIME:  Fluoroscopy Time: 5 minutes 48 seconds (356 mGy). COMPLICATIONS: None immediate. PROCEDURE: Informed written consent was obtained from the patient after a thorough discussion of the procedural risks, benefits and alternatives. All questions were addressed. Maximal Sterile Barrier Technique was utilized including caps, mask, sterile gowns, sterile gloves, sterile drape, hand hygiene and skin antiseptic. A timeout was performed prior to the initiation of the procedure. Utilizing 1% lidocaine as a local and deep anesthetic and fluoroscopic  guidance, a 21 gauge needle was utilized to puncture the collecting system in the region of the left renal pelvis guided by a previously placed stent. Brown colored urine likely related to prior hemorrhage was withdrawn from the needle and contrast injection confirmed location within the collecting system. Subsequently a small amount of room air was injected to allow for delineation of posterior caliceal system. A lower pole posterior calyx was identified and again utilizing 1% xylocaine as local anesthetic and fluoroscopic guidance, a 20 gauge diamond tip needle was passed into the posterior calyx. Both air and urine were withdrawn from the needle and subsequently a 0.018 platinum tip guidewire was advanced into the collecting system. Over this, a 6 [place] Accustick dilator was placed into the collecting system and a J wire coiled within the renal pelvis. The axis sick dilator was then removed and the tract dilated to 8 [place]. The dilator was then removed and a 8  French pigtail catheter was placed over the wire and coiled within the renal pelvis. The catheter was initially placed adjacent to the body of the stent although no significant drainage of urine was noted. Contrast material was injected which demonstrates a significant amount of thrombus within the collecting system and for this reason the catheter was advanced into the superior aspect of the renal pelvis where adequate urine drainage was noted. The catheter was then sutured into place utilizing 0 Prolene and a Hopkins buttress. Catheter was placed external gravity drain and bloody urine was obtained. The patient tolerated the catheter placement well and attention was then turned to the right collecting system. IMPRESSION: Successful placement of an 8 French pigtail catheter within the left renal collecting system. Thrombus is noted within the collecting system which may limit drainage somewhat. This should resolve over time. Electronically Signed   By:  Inez Catalina M.D.   On: 12/20/2017 08:02   Ir Nephrostomy Placement Right  Result Date: 12/20/2017 INDICATION: Ureteral occlusion with occluded ureteral stents EXAM: IR NEPHROSTOMY PLACEMENT RIGHT COMPARISON:  None. MEDICATIONS: 2 g IV Ancef; The antibiotic was administered in an appropriate time frame prior to skin puncture. ANESTHESIA/SEDATION: Fentanyl 75 mcg IV; Versed 1 mg IV Moderate Sedation Time:  35 The patient was continuously monitored during the procedure by the interventional radiology nurse under my direct supervision. CONTRAST:  68mL ISOVUE-300 IOPAMIDOL (ISOVUE-300) INJECTION 61% - administered into the collecting system(s) FLUOROSCOPY TIME:  Fluoroscopy Time: 5 minutes 48 seconds (356 mGy). COMPLICATIONS: None immediate. PROCEDURE: Informed written consent was obtained from the patient after a thorough discussion of the procedural risks, benefits and alternatives. All questions were addressed. Maximal Sterile Barrier Technique was utilized including caps, mask, sterile gowns, sterile gloves, sterile drape, hand hygiene and skin antiseptic. A timeout was performed prior to the initiation of the procedure. Utilizing 1% xylocaine as local anesthetic and fluoroscopic guidance a 21 gauge Chiba needle was placed into the right collecting system utilizing the pigtail from the previously placed ureteral stent as a guide. Brown tinged urine was withdrawn from the needle and subsequently a small amount of contrast was instilled to confirm needle placement. This demonstrates dilated collecting system. Subsequently a small amount of room air was instilled to allow for delineation of a posterior calyx. Again utilizing 1% xylocaine as local anesthetic and fluoroscopic guidance, a 20 gauge diamond tip needle was placed into the upper pole posterior calyx without difficulty. A 0.018 platinum tip guidewire was then advanced into the collecting system and coiled within the renal pelvis. A 6 French Accustick  dilator was then placed over the microwire into the collecting system. The central dilator in stiff near were then removed and a J tipped guidewire was coiled within the renal pelvis. Dilatation to 10 [place] was then performed and a 10 French nephrostomy catheter placed over the guidewire into the renal pelvis. Brisk return of bloody urine was then noted. The catheter was then attached to an external gravity bag. The catheter was sutured into place utilizing 0 Prolene and a Hopkins buttress. Patient tolerated the procedure well and was returned to her room in satisfactory condition. IMPRESSION: Successful right percutaneous nephrostomy placement as described above. Bloody urine was noted at the termination of the procedure which should clear over time. Electronically Signed   By: Inez Catalina M.D.   On: 12/20/2017 08:06     Medications:   . heparin 1,400 Units/hr (12/20/17 2106)   . amLODipine  5 mg Oral Daily  .  exemestane  25 mg Oral QPC breakfast  . Influenza vac split quadrivalent PF  0.5 mL Intramuscular Tomorrow-1000  . pantoprazole  40 mg Oral Daily  . polyethylene glycol  17 g Oral q1800  . scopolamine  1 patch Transdermal Q72H   acetaminophen **OR** acetaminophen, bisacodyl, diazepam, HYDROmorphone (DILAUDID) injection, lactulose, ondansetron **OR** ondansetron (ZOFRAN) IV, opium-belladonna, oxybutynin, senna-docusate, traZODone  Assessment/ Plan:  57 y.o. female  with a PMHx of breast cancer status post bilateral mastectomy with chemotherapy and radiation therapy, anxiety, GERD, irritable bowel syndrome, history of neutropenia, plantar fasciitis, who was admitted to [facility] on 12/16/2017 for evaluation of abnormal lab values including hypercalcemia as well as acute renal failure.   1.  Acute renal failure due to obstructive uropathy. 2.  Bilateral hydronephrosis. 3.  Hypokalemia.  4.  Bladder mass with bony metastasis.  5.  Anemia unspecified.   Plan: with relief of obstruction  renal function has improved significantly.  BUN down to 23 with a creatinine of 2.16.  Continue to monitor flow from nephrostomies.  In addition continue to monitor renal function daily while still here.  We will continue to monitor her renal progress both as inpatient and outpatient.  Avoid nephrotoxins as possible.  Patient given oral K repleation today.     LOS: 5 Hafsa Lohn 10/3/20193:28 PM

## 2017-12-21 NOTE — Progress Notes (Signed)
Patient on plan of care prior to pathways. 

## 2017-12-21 NOTE — Progress Notes (Addendum)
ANTICOAGULATION CONSULT NOTE - Follow up [place] for Heparin  Indication: chest pain/ACS  Allergies  Allergen Reactions  . Adhesive [Tape] Other (See Comments)    Redness:Please use paper tape    Patient Measurements: Height: 5\' 4"  (162.6 cm) Weight: 159 lb 12.8 oz (72.5 kg) IBW/kg (Calculated) : 54.7 Heparin Dosing Weight: 69.6 kg  Vital Signs: Temp: 98.4 F (36.9 C) (10/02 1929) Temp Source: Oral (10/02 1929) BP: 166/83 (10/02 1929) Pulse Rate: 107 (10/02 1929)  Labs: Recent Labs    12/18/17 0649 12/19/17 0620 12/20/17 0415 12/20/17 1519 12/20/17 2313  HGB 11.7* 10.8* 11.3*  --   --   HCT 33.8* 31.4* 32.3*  --   --   PLT 230 217 238  --   --   HEPARINUNFRC 0.49 0.37 0.13* 0.41 0.39  CREATININE 2.31* 4.78* 4.27*  --   --     Estimated Creatinine Clearance: 14.4 mL/min (A) (by C-G formula based on SCr of 4.27 mg/dL (H)).   Assessment: Pharmacy consulted for heparin drip dosing and monitoring in 57 yo female admitted with Hypercalcemia and found to have DVT.  Current rate of heparin 1100 units/hr (=11 ml/hr).   Heparin course: 10/01 AM heparin level 0.37. Continue current regimen. Recheck heparin level and CBC with tomorrow AM labs.  Goal of Therapy:  Heparin level 0.3-0.7 units/ml Monitor platelets by anticoagulation protocol: Yes   Plan:  Heparin stopped for procedure at 0948. Per RN procedure planned for ~3 pm. Will need to follow up heparin plans after procedure (anticipate heparin to continue after procedure), when to restart heparin drip after procedure, and recheck HL 8h after restart.   10/02 AM heparin level 0.13. 2100 unit bolus and increase rate to 1400 units/hr. Recheck in 8 hours.  10/02 1519 heparin level 0.41.  Will continue current rate of 1400 units/hr.  Will recheck heparin level in 8 hours.  10/02 2300 heparin level 0.39. Continue current regimen. Recheck heparin level and CBC with tomorrow AM labs.  10/03 AM heparin  level 0.40. Continue current regimen. Recheck heparin level and CBC with tomorrow AM labs.  Sim Boast, PharmD, BCPS  12/21/17 12:56 AM

## 2017-12-21 NOTE — Progress Notes (Signed)
Physical Therapy Treatment Patient Details Name: Mary [name] MRN: [id number] DOB: 10/19/1960 Today's Date: 12/21/2017    History of Present Illness Pt is a 57 y.o. female presenting to hospital 12/16/17 after critical lab of calcemia of 13.9.  Per chart pt has had vertigo since 06/2017.  Also twisted L ankle getting out of car Monday 12/11/17.  Pt (+) for DVT L LE 9/28.  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  S/p cystoscopy with stent placement B 9/29.  PMH includes R mastectomy, breast CA, IBS, neutropenia, plantar fasciitis, s/p chemo, vertigo.    PT Comments    Pt in chair, ready for gait.  Stood and was able to ambulate x 1 around unit pushing IV pole.  Forward posture and choppy gait due to IV pole legs but pt stated she felt more comfortable pushing IV pole.  After seated rest, a walker was tried.  She ambulated well around unit a second lap.  After discussion, pt stated she felt more comfortable with a walker at this time.  Will request one for discharge to assist with mobility and safety.   Follow Up Recommendations  Outpatient PT     Equipment Recommendations  Rolling walker with 5" wheels    Recommendations for Other Services       Precautions / Restrictions Precautions Precautions: Fall Precaution Comments: B urostomy tubes Restrictions Weight Bearing Restrictions: No    Mobility  Bed Mobility                  Transfers   Equipment used: Rolling walker (2 wheeled) Transfers: Sit to/from Stand Sit to Stand: Supervision            Ambulation/Gait Ambulation/Gait assistance: Supervision;Min guard Gait Distance (Feet): 200 Feet Assistive device: Rolling walker (2 wheeled);IV Pole Gait Pattern/deviations: Step-through pattern;Trunk flexed Gait velocity: decreased   General Gait Details: 1 lap  pushing IV pole, 1 lap with RW - no LOB or buckling noted   Stairs             Wheelchair Mobility    Modified Rankin (Stroke Patients Only)       Balance Overall balance assessment: Needs assistance Sitting-balance support: No upper extremity supported;Feet supported Sitting balance-Leahy Scale: Normal     Standing balance support: Bilateral upper extremity supported Standing balance-Leahy Scale: Good Standing balance comment: Prefers RW for support/balance                            Cognition Arousal/Alertness: Awake/alert Behavior During Therapy: WFL for tasks assessed/performed Overall Cognitive Status: Within Functional Limits for tasks assessed                                        Exercises      General Comments        Pertinent Vitals/Pain Pain Assessment: No/denies pain    Home Living                      Prior Function            PT Goals (current goals can now be found in the care plan section) Progress towards PT goals: Progressing toward goals    Frequency    Min 2X/week  PT Plan Current plan remains appropriate    Co-evaluation              AM-PAC PT "6 Clicks" Daily Activity  Outcome Measure  Difficulty turning over in bed (including adjusting bedclothes, sheets and blankets)?: None Difficulty moving from lying on back to sitting on the side of the bed? : A Little Difficulty sitting down on and standing up from a chair with arms (e.g., wheelchair, bedside commode, etc,.)?: None Help needed moving to and from a bed to chair (including a wheelchair)?: A Little Help needed walking in hospital room?: A Little Help needed climbing 3-5 steps with a railing? : A Little 6 Click Score: 20    End of Session Equipment Utilized During Treatment: Gait belt Activity Tolerance: Patient tolerated treatment well Patient left: in chair;with call bell/phone within reach;with family/visitor  present         Time: 1050-1102 PT Time Calculation (min) (ACUTE ONLY): 12 min  Charges:  $Gait Training: 8-22 mins                     Chesley Noon, PTA 12/21/17, 11:08 AM

## 2017-12-22 ENCOUNTER — Other Ambulatory Visit: Payer: Self-pay | Admitting: Oncology

## 2017-12-22 ENCOUNTER — Encounter (INDEPENDENT_AMBULATORY_CARE_PROVIDER_SITE_OTHER): Payer: Self-pay

## 2017-12-22 ENCOUNTER — Other Ambulatory Visit (INDEPENDENT_AMBULATORY_CARE_PROVIDER_SITE_OTHER): Payer: Self-pay | Admitting: Nurse Practitioner

## 2017-12-22 ENCOUNTER — Telehealth: Payer: Self-pay | Admitting: Urology

## 2017-12-22 ENCOUNTER — Other Ambulatory Visit: Payer: Self-pay | Admitting: *Deleted

## 2017-12-22 DIAGNOSIS — C50919 Malignant neoplasm of unspecified site of unspecified female breast: Secondary | ICD-10-CM

## 2017-12-22 LAB — BASIC METABOLIC PANEL
ANION GAP: 8 (ref 5–15)
BUN: 18 mg/dL (ref 6–20)
CALCIUM: 7.8 mg/dL — AB (ref 8.9–10.3)
CO2: 20 mmol/L — ABNORMAL LOW (ref 22–32)
Chloride: 114 mmol/L — ABNORMAL HIGH (ref 98–111)
Creatinine, Ser: 1.57 mg/dL — ABNORMAL HIGH (ref 0.44–1.00)
GFR, EST AFRICAN AMERICAN: 41 mL/min — AB (ref >60)
GFR, EST NON AFRICAN AMERICAN: 36 mL/min — AB (ref >60)
GLUCOSE: 113 mg/dL — AB (ref 70–99)
POTASSIUM: 3.7 mmol/L (ref 3.5–5.1)
SODIUM: 142 mmol/L (ref 135–145)

## 2017-12-22 LAB — CBC
HCT: 31.8 % — ABNORMAL LOW (ref 35.0–47.0)
HEMOGLOBIN: 11.1 g/dL — AB (ref 12.0–16.0)
MCH: 32.2 pg (ref 26.0–34.0)
MCHC: 35 g/dL (ref 32.0–36.0)
MCV: 92 fL (ref 80.0–100.0)
PLATELETS: 326 10*3/uL (ref 150–440)
RBC: 3.46 MIL/uL — ABNORMAL LOW (ref 3.80–5.20)
RDW: 12.6 % (ref 11.5–14.5)
WBC: 7.7 10*3/uL (ref 3.6–11.0)

## 2017-12-22 LAB — HEPARIN LEVEL (UNFRACTIONATED): Heparin Unfractionated: 0.36 IU/mL (ref 0.30–0.70)

## 2017-12-22 MED ORDER — DEXAMETHASONE 4 MG PO TABS: 4.0000 mg | ORAL_TABLET | ORAL | 0 refills | Status: DC

## 2017-12-22 MED ORDER — SODIUM CHLORIDE 0.9 % IV SOLN
80.0000 mg/m2 | Freq: Once | INTRAVENOUS | Status: AC
  Administered 2017-12-22: 15:00:00 144 mg via INTRAVENOUS
  Filled 2017-12-22: qty 24

## 2017-12-22 MED ORDER — SODIUM CHLORIDE 0.9 % IV SOLN
Freq: Once | INTRAVENOUS | Status: AC
  Administered 2017-12-22: 14:00:00 via INTRAVENOUS

## 2017-12-22 MED ORDER — APIXABAN 5 MG PO TABS
10.0000 mg | ORAL_TABLET | Freq: Two times a day (BID) | ORAL | Status: DC
  Administered 2017-12-22: 10 mg via ORAL
  Filled 2017-12-22: qty 2

## 2017-12-22 MED ORDER — SODIUM CHLORIDE 0.9 % IV SOLN
10.0000 mg | Freq: Once | INTRAVENOUS | Status: AC
  Administered 2017-12-22: 14:00:00 10 mg via INTRAVENOUS
  Filled 2017-12-22: qty 1

## 2017-12-22 MED ORDER — DIPHENHYDRAMINE HCL 50 MG/ML IJ SOLN
50.0000 mg | Freq: Once | INTRAMUSCULAR | Status: AC
  Administered 2017-12-22: 50 mg via INTRAVENOUS
  Filled 2017-12-22: qty 1

## 2017-12-22 MED ORDER — BELLADONNA ALKALOIDS-OPIUM 16.2-60 MG RE SUPP: 1.0000 | Freq: Three times a day (TID) | RECTAL | 0 refills | Status: DC | PRN

## 2017-12-22 MED ORDER — APIXABAN 5 MG PO TABS: 5.0000 mg | ORAL_TABLET | Freq: Two times a day (BID) | ORAL | Status: DC

## 2017-12-22 MED ORDER — OXYBUTYNIN CHLORIDE 5 MG PO TABS: 5.0000 mg | ORAL_TABLET | Freq: Three times a day (TID) | ORAL | 0 refills | Status: DC | PRN

## 2017-12-22 MED ORDER — SCOPOLAMINE 1 MG/3DAYS TD PT72: 1.0000 | MEDICATED_PATCH | TRANSDERMAL | 0 refills | Status: DC

## 2017-12-22 MED ORDER — APIXABAN 5 MG PO TABS: ORAL_TABLET | ORAL | 0 refills | Status: DC

## 2017-12-22 MED ORDER — FAMOTIDINE IN NACL 20-0.9 MG/50ML-% IV SOLN
20.0000 mg | Freq: Once | INTRAVENOUS | Status: AC
  Administered 2017-12-22: 20 mg via INTRAVENOUS
  Filled 2017-12-22: qty 50

## 2017-12-22 NOTE — Progress Notes (Signed)
ANTICOAGULATION CONSULT NOTE - Follow up [place] for Heparin  Indication: chest pain/ACS  Allergies  Allergen Reactions  . Adhesive [Tape] Other (See Comments)    Redness:Please use paper tape    Patient Measurements: Height: 5\' 4"  (162.6 cm) Weight: 159 lb 12.8 oz (72.5 kg) IBW/kg (Calculated) : 54.7 Heparin Dosing Weight: 69.6 kg  Vital Signs: Temp: 98.1 F (36.7 C) (10/04 0437) Temp Source: Oral (10/04 0437) BP: 138/81 (10/04 0437) Pulse Rate: 92 (10/04 0437)  Labs: Recent Labs    12/20/17 0415  12/20/17 2313 12/21/17 0552 12/22/17 0513  HGB 11.3*  --   --  10.5* 11.1*  HCT 32.3*  --   --  29.0* 31.8*  PLT 238  --   --  263 326  HEPARINUNFRC 0.13*   < > 0.39 0.40 0.36  CREATININE 4.27*  --   --  2.16* 1.57*   < > = values in this interval not displayed.    Estimated Creatinine Clearance: 39 mL/min (A) (by C-G formula based on SCr of 1.57 mg/dL (H)).   Assessment: Pharmacy consulted for heparin drip dosing and monitoring in 57 yo female admitted with Hypercalcemia and found to have DVT.  Current rate of heparin 1100 units/hr (=11 ml/hr).   Heparin course: 10/01 AM heparin level 0.37. Continue current regimen. Recheck heparin level and CBC with tomorrow AM labs.  Goal of Therapy:  Heparin level 0.3-0.7 units/ml Monitor platelets by anticoagulation protocol: Yes   Plan:  Heparin stopped for procedure at 0948. Per RN procedure planned for ~3 pm. Will need to follow up heparin plans after procedure (anticipate heparin to continue after procedure), when to restart heparin drip after procedure, and recheck HL 8h after restart.   10/02 AM heparin level 0.13. 2100 unit bolus and increase rate to 1400 units/hr. Recheck in 8 hours.  10/02 1519 heparin level 0.41.  Will continue current rate of 1400 units/hr.  Will recheck heparin level in 8 hours.  10/02 2300 heparin level 0.39. Continue current regimen. Recheck heparin level and CBC with tomorrow  AM labs.  10/03 AM heparin level 0.40. Continue current regimen. Recheck heparin level and CBC with tomorrow AM labs.  10/04 AM heparin level 0.36. Continue current regimen. Recheck heparin level and CBC with tomorrow AM labs.  Sim Boast, PharmD, BCPS  12/22/17 6:32 AM

## 2017-12-22 NOTE — Telephone Encounter (Signed)
-----   Message from Hollice Espy, MD sent at 12/22/2017  8:08 AM EDT ----- Regarding: Please schedule cysto/ stent removal  Please schedule cysto/ stent removal as soon as possible for this inpatient (?next week) with me.  OVerbook at end of AM or afternoon as needed.  Hollice Espy, MD

## 2017-12-22 NOTE — Progress Notes (Signed)
Patient received chemotherapy without complications.

## 2017-12-22 NOTE — Progress Notes (Signed)
PT Cancellation Note  Patient Details Name: Mary [name] MRN: [id number] DOB: 11-Jul-1960   Cancelled Treatment:    Reason Eval/Treat Not Completed: Other (comment): Upon entering room nursing initiating chemotherapy treatment.  PT session held this date and will attempt to see pt at a future date/time as medically appropriate.     Linus Salmons PT, DPT 12/22/17, 2:30 PM

## 2017-12-22 NOTE — Telephone Encounter (Signed)
App made and given to Ginger on 1-C 12-22-17 MB

## 2017-12-22 NOTE — Discharge Summary (Signed)
[place] [place] NAME: Mary [name]    MR#:  [id number]  DATE OF BIRTH:  02-16-1961  DATE OF ADMISSION:  12/16/2017 ADMITTING PHYSICIAN: Arta Silence, MD  DATE OF DISCHARGE: 12/22/2017  PRIMARY CARE PHYSICIAN: [name], MD    ADMISSION DIAGNOSIS:  Hypercalcemia [E83.52] Malignancy ([place]) [C80.1] Edema of left lower extremity [R60.0]  DISCHARGE DIAGNOSIS:  Active Problems:   Hypercalcemia   Malignancy (HCC)   Acute deep vein thrombosis (DVT) of distal vein of left lower extremity (HCC)   Dizziness   Edema of left lower extremity   Metastatic breast cancer ([place])   SECONDARY DIAGNOSIS:   Past Medical History:  Diagnosis Date  . Anxiety    takes Xanax prn  . Breast cancer [employer]) August 2013   bilateral, er/pr+  . Complication of anesthesia    pt states she had the shakes extremely bad  . Cough    at night d/t reflux;nothing productive  . Dental crowns present    x 4  . GERD (gastroesophageal reflux disease)    takes Nexium daily  . History of bronchitis    08/2011  . History of colon polyps   . Hot flashes, menopausal 08/08/2012  . IBS (irritable bowel syndrome)    immodium prn  . Insomnia    takes Ambien nightly  . Irritable bowel syndrome   . Neutropenia, drug-induced ([place]) 12/15/2011  . Nocturia   . Plantar fasciitis   . S/P radiation therapy 07/03/2012 through 08/18/2038                                                        Right chest wall/regional lymph nodes 5040 cGy 28 sessions, right chest wall/mastectomy scar boost 1000 cGy 5 sessions                          . Status post chemotherapy    FEC 100 starting on 12/08/2011 - 01/18/12/single agent Taxol x12 weeks from 02/02/2012 through January 2014   . Urinary frequency   . Use of tamoxifen (Nolvadex)   . Vertigo     HOSPITAL COURSE:   1.  Metastatic breast cancer, to bone and bladder wall and peritoneum.  Biopsy came back after urological  procedure consistent with her known breast cancer.  Dr. Tasia Catchings from oncology gave her first dose of chemotherapy on the day of discharge.  She will follow-up with the patient on Monday and set up for port placement and further chemotherapy.  Ascites hopefully will get better with chemotherapy.  Hesitant on doing any Lasix or Aldactone with the patient just recovering from acute kidney injury. 2.  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  Creatinine peaked at 4.78.  Creatinine continue to worsen after urological procedure with stents.  Patient ended up needing urgent nephrostomy tubes by interventional radiology.  Creatinine has improved down to 1.57 upon discharge home.  This should continue to improve as outpatient.  Dr. Erlene Quan from urology will follow up on Tuesday as outpatient and they can remove the stents while the patient is on Eliquis. 3.  Severe hypercalcemia on presentation secondary to metastatic breast cancer.  The patient was given Zometa and the patient's calcium has normalized.  Calcium went from 12.2 to 7.8.  4.  Acute DVT in the left lower extremity.  The patient was on heparin drip for the most of the hospital course and held for procedures.  The patient will be switched over to Eliquis upon discharge home. 5.  Dizziness.  Likely an inner ear issue.  MRI of the brain with contrast was unable to be done secondary to acute kidney injury. 6.  Hypertension on Norvasc 7.  Constipation during the hospital course which has resolved with medication.  Paperwork for work filled out.  out of work for 1 month.  Further paperwork if needed can be filled out by Dr. Hadassah Pais CONDITIONS:   Fair  CONSULTS OBTAINED:  Treatment Team:  Arta Silence, MD Hollice Espy, MD Earlie Server, MD  DRUG ALLERGIES:   Allergies  Allergen Reactions  . Adhesive [Tape] Other (See Comments)    Redness:Please use paper tape    DISCHARGE MEDICATIONS:   Allergies as of 12/22/2017       Reactions   Adhesive [tape] Other (See Comments)   Redness:Please use paper tape      Medication List    STOP taking these medications   losartan-hydrochlorothiazide 50-12.5 MG tablet Commonly known as:  HYZAAR   methocarbamol 500 MG tablet Commonly known as:  ROBAXIN   predniSONE 10 MG tablet Commonly known as:  DELTASONE     TAKE these medications   amLODipine 5 MG tablet Commonly known as:  NORVASC TAKE 1 TABLET BY MOUTH DAILY.   apixaban 5 MG Tabs tablet Commonly known as:  ELIQUIS Two tablets twice a day for seven days then one tablet twice a day afterwards   CALTRATE 600+D PO Take 1 tablet by mouth daily.   dexamethasone 4 MG tablet Commonly known as:  DECADRON Take 1 tablet (4 mg total) by mouth See admin instructions. You may take 4 mg daily for 2 days after chemotherapy if needed for nausea   diazepam 10 MG tablet Commonly known as:  VALIUM Take 1 tablet (10 mg total) by mouth at bedtime as needed for anxiety.   esomeprazole 40 MG capsule Commonly known as:  NEXIUM Take 40 mg by mouth daily before breakfast.   exemestane 25 MG tablet Commonly known as:  AROMASIN TAKE 1 TABLET BY MOUTH ONCE DAILY AFTER BREAKFAST   goserelin 3.6 MG injection Commonly known as:  ZOLADEX Inject 3.6 mg into the skin every 28 (twenty-eight) days.   lidocaine-prilocaine cream Commonly known as:  EMLA Apply to affected area once   loperamide 2 MG tablet Commonly known as:  IMODIUM A-D Take 2 mg by mouth 4 (four) times daily as needed for diarrhea or loose stools.   multivitamin tablet Take 1 tablet by mouth daily.   ondansetron 4 MG tablet Commonly known as:  ZOFRAN Take 1 tablet (4 mg total) by mouth every 8 (eight) hours as needed for nausea or vomiting.   opium-belladonna 16.2-60 MG suppository Commonly known as:  B&O SUPPRETTES Place 1 suppository rectally every 8 (eight) hours as needed for bladder spasms.   oxybutynin 5 MG tablet Commonly known as:   DITROPAN Take 1 tablet (5 mg total) by mouth every 8 (eight) hours as needed for bladder spasms.   prochlorperazine 10 MG tablet Commonly known as:  COMPAZINE Take 1 tablet (10 mg total) by mouth every 6 (six) hours as needed (Nausea or vomiting).   scopolamine 1 MG/3DAYS Commonly known as:  TRANSDERM-SCOP Place 1 patch (1.5 mg total) onto the skin every 3 (three)  days. Start taking on:  12/25/2017   traZODone 50 MG tablet Commonly known as:  DESYREL Take 0.5-1 tablets (25-50 mg total) by mouth at bedtime as needed for sleep.   vitamin C 100 MG tablet Take 100 mg by mouth daily.            Durable Medical Equipment  (From admission, onward)         Start     Ordered   12/22/17 1608  For home use only DME Walker rolling  Once    Question:  Patient needs a walker to treat with the following condition  Answer:  Unsteady gait   12/22/17 1607           DISCHARGE INSTRUCTIONS:   Follow-up with Dr. Tasia Catchings on Monday Follow-up with Dr. Erlene Quan on Tuesday  If you experience worsening of your admission symptoms, develop shortness of breath, life threatening emergency, suicidal or homicidal thoughts you must seek medical attention immediately by calling 911 or calling your MD immediately  if symptoms less severe.  You Must read complete instructions/literature along with all the possible adverse reactions/side effects for all the Medicines you take and that have been prescribed to you. Take any new Medicines after you have completely understood and accept all the possible adverse reactions/side effects.   Please note  You were cared for by a hospitalist during your hospital stay. If you have any questions about your discharge medications or the care you received while you were in the hospital after you are discharged, you can call the unit and asked to speak with the hospitalist on call if the hospitalist that took care of you is not available. Once you are discharged, your primary  care physician will handle any further medical issues. Please note that NO REFILLS for any discharge medications will be authorized once you are discharged, as it is imperative that you return to your primary care physician (or establish a relationship with a primary care physician if you do not have one) for your aftercare needs so that they can reassess your need for medications and monitor your lab values.    Today   CHIEF COMPLAINT:   Chief Complaint  Patient presents with  . abnormal labs    HISTORY OF PRESENT ILLNESS:  Mary [name]  is a 57 y.o. female sent in with abnormal labs a very high calcium   VITAL SIGNS:  Blood pressure (!) 152/87, pulse (!) 105, temperature 98.1 F (36.7 C), temperature source Oral, resp. rate 18, height 5\' 4"  (1.626 m), weight 72.5 kg, last menstrual period 11/28/2011, SpO2 95 %.   PHYSICAL EXAMINATION:  GENERAL:  57 y.o.-year-old patient lying in the bed with no acute distress.  EYES: Pupils equal, round, reactive to light and accommodation. No scleral icterus. Extraocular muscles intact.  HEENT: Head atraumatic, normocephalic. Oropharynx and nasopharynx clear.  NECK:  Supple, no jugular venous distention. No thyroid enlargement, no tenderness.  LUNGS: Normal breath sounds bilaterally, no wheezing, rales,rhonchi or crepitation. No use of accessory muscles of respiration.  CARDIOVASCULAR: S1, S2 normal. No murmurs, rubs, or gallops.  ABDOMEN: Soft, non-tender, distended. Bowel sounds present. No organomegaly or mass.  EXTREMITIES: 2+ pedal edema.  No cyanosis, or clubbing.  NEUROLOGIC: Cranial nerves II through XII are intact. Muscle strength 5/5 in all extremities. Sensation intact. Gait not checked.  PSYCHIATRIC: The patient is alert and oriented x 3.  SKIN: No obvious rash, lesion, or ulcer.   DATA REVIEW:   CBC Recent Labs  Lab 12/22/17 0513  WBC 7.7  HGB 11.1*  HCT 31.8*  PLT 326    Chemistries  Recent Labs  Lab 12/16/17 0332   12/22/17 0513  NA 139   < > 142  K 3.0*   < > 3.7  CL 102   < > 114*  CO2 28   < > 20*  GLUCOSE 113*   < > 113*  BUN 30*   < > 18  CREATININE 1.63*   < > 1.57*  CALCIUM 13.9*   < > 7.8*  MG 2.1  --   --   AST 39  --   --   ALT 43  --   --   ALKPHOS 110  --   --   BILITOT 0.6  --   --    < > = values in this interval not displayed.       Management plans discussed with the patient, family and they are in agreement.  CODE STATUS:     Code Status Orders  (From admission, onward)         Start     Ordered   12/16/17 1248  Full code  Continuous     12/16/17 1247        Code Status History    Date Active Date Inactive Code Status Order ID Comments User Context   05/18/2012 1813 05/20/2012 1306 Full Code [id number]  Adin Hector, MD Inpatient    Advance Directive Documentation     Most Recent Value  Type of Advance Directive  Living will  Pre-existing out of facility DNR order (yellow form or pink MOST form)  -  "MOST" Form in Place?  -      TOTAL TIME TAKING CARE OF THIS PATIENT: 35 minutes.    Loletha Grayer M.D on 12/22/2017 at 4:07 PM  Between 7am to 6pm - Pager - [phone]  After 6pm go to [email or web address] - password [email or web address]  [facility] Office  [phone]  CC: Primary care physician; [name], MD

## 2017-12-22 NOTE — Care Management (Signed)
Discharge to home today per Dr. Leslye Peer. Eliquis coupon given for 30 days free. Physical therapy is recommending outpatient therapy, Spoke with Ms. Benavidez at the bedside. States that she is undecided about wanting outpatient physical therapy. She is in agreement with faxing referral to rehab department. Will know when rehab calls her for an appointment Shelbie Ammons RN MSN [facility] [phone]

## 2017-12-22 NOTE — Progress Notes (Signed)
Patient discharged home per MD order. All discharge orders given to patient  and all questions answered.

## 2017-12-22 NOTE — Progress Notes (Signed)
Hematology/Oncology Progress Note [facility] Telephone:([phone] [phone]  Patient Care Team: [name], MD as PCP - General (Internal Medicine) Serena Colonel, RN as [place] [facility]   Name of the patient: Mary [name]  [id number]  Sep 29, 1960  Date of visit: 12/22/17   INTERVAL HISTORY-  No acute overnight events.  Status post percutaneous nephrostomy tubes bilaterally.  Creatinine further improved, close to baseline. Reports that she has not passed any bowel movement today yet.  Denies any shortness of breath. Bilateral lower extremity swelling, L>R, worsened.  Review of Systems  Constitutional: Positive for malaise/fatigue. Negative for chills and fever.  HENT: Negative for sore throat.   Eyes: Negative for blurred vision.  Cardiovascular: Positive for leg swelling. Negative for chest pain.  Gastrointestinal: Positive for constipation. Negative for nausea and vomiting.  Genitourinary: Negative for urgency.  Musculoskeletal: Negative for myalgias.  Skin: Negative for rash.  Neurological: Negative for focal weakness.  Psychiatric/Behavioral: Negative for hallucinations.     Allergies  Allergen Reactions  . Adhesive [Tape] Other (See Comments)    Redness:Please use paper tape    Patient Active Problem List   Diagnosis Date Noted  . Edema of left lower extremity   . Metastatic breast cancer ([place])   . Hypercalcemia 12/16/2017  . Malignancy ([place])   . Acute deep vein thrombosis (DVT) of distal vein of left lower extremity ([place])   . Dizziness   . Bilateral posterior neck pain 11/24/2017  . Elevated liver enzymes 11/24/2017  . Tubular adenoma of colon 10/11/2017  . 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 Generalized anxiety disorder 09/19/2017  . Vertigo, labyrinthine, bilateral 08/27/2017  . Prediabetes 08/27/2017  . Encounter for screening colonoscopy 09/21/2014  . Other malaise and  fatigue 12/14/2013  . Hot flashes, menopausal 08/08/2012  . Neutropenia, drug-induced ([place]) 12/15/2011  . Primary cancer of upper outer quadrant of left female breast ([place]) 11/17/2011  . Hypertension 09/23/2011  . PLANTAR FASCIITIS, LEFT 12/01/2009  . UNEQUAL LEG LENGTH 12/01/2009     Past Medical History:  Diagnosis Date  . Anxiety    takes Xanax prn  . Breast cancer [facility]) August 2013   bilateral, er/pr+  . Complication of anesthesia    pt states she had the shakes extremely bad  . Cough    at night d/t reflux;nothing productive  . Dental crowns present    x 4  . GERD (gastroesophageal reflux disease)    takes Nexium daily  . History of bronchitis    08/2011  . History of colon polyps   . Hot flashes, menopausal 08/08/2012  . IBS (irritable bowel syndrome)    immodium prn  . Insomnia    takes Ambien nightly  . Irritable bowel syndrome   . Neutropenia, drug-induced ([place]) 12/15/2011  . Nocturia   . Plantar fasciitis   . S/P radiation therapy 07/03/2012 through 08/18/2038                                                        Right chest wall/regional lymph nodes 5040 cGy 28 sessions, right chest wall/mastectomy scar boost 1000 cGy 5 sessions                          .  Status post chemotherapy    FEC 100 starting on 12/08/2011 - 01/18/12/single agent Taxol x12 weeks from 02/02/2012 through January 2014   . Urinary frequency   . Use of tamoxifen (Nolvadex)   . Vertigo      Past Surgical History:  Procedure Laterality Date  . BREAST LUMPECTOMY  2008   left  . COLONOSCOPY    . COLONOSCOPY WITH PROPOFOL N/A 10/10/2017   Procedure: COLONOSCOPY WITH PROPOFOL;  Surgeon: Lucilla Lame, MD;  Location: [facility] ENDOSCOPY;  Service: Endoscopy;  Laterality: N/A;  . CYSTOSCOPY WITH STENT PLACEMENT Bilateral 12/17/2017   Procedure: CYSTOSCOPY WITH STENT PLACEMENT;  Surgeon: Hollice Espy, MD;  Location: [facility] ORS;  Service: Urology;  Laterality: Bilateral;  . IR NEPHROSTOMY PLACEMENT  LEFT  12/19/2017  . IR NEPHROSTOMY PLACEMENT RIGHT  12/19/2017  . MASTECTOMY    . MASTECTOMY WITH AXILLARY LYMPH NODE DISSECTION Right 05/18/2012   Procedure: MASTECTOMY WITH AXILLARY LYMPH NODE DISSECTION;  Surgeon: Adin Hector, MD;  Location: [place];  Service: General;  Laterality: Right;  Bilateral Total Mastectomy , right axillary lymph node dissection left axillary sentinel node biopsy, removal of Porta Cath  . PORT-A-CATH REMOVAL N/A 05/18/2012   Procedure: REMOVAL PORT-A-CATH;  Surgeon: Adin Hector, MD;  Location: [place];  Service: General;  Laterality: N/A;  . PORTACATH PLACEMENT  12/02/2011   Procedure: INSERTION PORT-A-CATH;  Surgeon: Adin Hector, MD;  Location: [place];  Service: General;  Laterality: N/A;  insertion port a cath with fluoroscopy  . SIMPLE MASTECTOMY WITH AXILLARY SENTINEL NODE BIOPSY Left 05/18/2012   Procedure: SIMPLE MASTECTOMY WITH AXILLARY SENTINEL NODE BIOPSY;  Surgeon: Adin Hector, MD;  Location: [place];  Service: General;  Laterality: Left;    Social History   Socioeconomic History  . Marital status: Married    Spouse name: Not on file  . Number of children: Not on file  . Years of education: Not on file  . Highest education level: Not on file  Occupational History  . Occupation: Network engineer II    Employer: [employer]  Social Needs  . Financial resource strain: Not on file  . Food insecurity:    Worry: Not on file    Inability: Not on file  . Transportation needs:    Medical: Not on file    Non-medical: Not on file  Tobacco Use  . Smoking status: Former Smoker    Packs/day: 0.50    Last attempt to quit: 03/21/1997    Years since quitting: 20.7  . Smokeless tobacco: Never Used  Substance and Sexual Activity  . Alcohol use: Yes    Alcohol/week: 0.0 standard drinks    Comment: occasionally wine  . Drug use: No  . Sexual activity: Yes    Birth control/protection: None  Lifestyle  . Physical activity:    Days per  week: Not on file    Minutes per session: Not on file  . Stress: Not on file  Relationships  . Social connections:    Talks on phone: Not on file    Gets together: Not on file    Attends religious service: Not on file    Active member of club or organization: Not on file    Attends meetings of clubs or organizations: Not on file    Relationship status: Not on file  . Intimate partner violence:    Fear of current or ex partner: Not on file    Emotionally abused: Not on file  Physically abused: Not on file    Forced sexual activity: Not on file  Other Topics Concern  . Not on file  Social History Narrative   Married to [employer]  No children 26 years old with fmp.  20 year use of birth control pills.       Family History  Problem Relation Age of Onset  . Hypertension Mother   . Hypertension Maternal Grandmother   . Pancreatic cancer Paternal Grandmother        diagnosed in her 55s  . Ovarian cancer Maternal Aunt        maternal grandmother's sister  . Breast cancer Paternal Aunt        diagnosed in late 60s early 41s  . Parkinsonism Paternal Grandfather      Current Facility-Administered Medications:  .  acetaminophen (TYLENOL) tablet 650 mg, 650 mg, Oral, Q6H PRN, 650 mg at 12/22/17 0053 **OR** acetaminophen (TYLENOL) suppository 650 mg, 650 mg, Rectal, Q6H PRN, Jodell Cipro, Prasanna, MD .  amLODipine (NORVASC) tablet 5 mg, 5 mg, Oral, Daily, Jodell Cipro, Prasanna, MD, 5 mg at 12/22/17 0901 .  apixaban (ELIQUIS) tablet 10 mg, 10 mg, Oral, BID, 10 mg at 12/22/17 0900 **FOLLOWED BY** [START ON 12/29/2017] apixaban (ELIQUIS) tablet 5 mg, 5 mg, Oral, BID, Wieting, Richard, MD .  bisacodyl (DULCOLAX) EC tablet 5 mg, 5 mg, Oral, Daily PRN, Arta Silence, MD, 5 mg at 12/19/17 1006 .  diazepam (VALIUM) tablet 10 mg, 10 mg, Oral, QHS PRN, Arta Silence, MD .  famotidine (PEPCID) IVPB 20 mg premix, 20 mg, Intravenous, Once, Earlie Server, MD .  HYDROmorphone (DILAUDID)  injection 0.5-1 mg, 0.5-1 mg, Intravenous, Q3H PRN, Arta Silence, MD, 1 mg at 12/22/17 0938 .  ondansetron (ZOFRAN) tablet 4 mg, 4 mg, Oral, Q6H PRN **OR** ondansetron (ZOFRAN) injection 4 mg, 4 mg, Intravenous, Q6H PRN, Arta Silence, MD, 4 mg at 12/22/17 0859 .  opium-belladonna (B&O SUPPRETTES) 16.2-60 MG suppository 1 suppository, 1 suppository, Rectal, Q8H PRN, Hollice Espy, MD .  oxybutynin [facility]) tablet 5 mg, 5 mg, Oral, Q8H PRN, Hollice Espy, MD, 5 mg at 12/20/17 2124 .  PACLitaxel (TAXOL) 144 mg in sodium chloride 0.9 % 250 mL chemo infusion (</= 76m/m2), 80 mg/m2 (Treatment Plan Recorded), Intravenous, Once, YEarlie Server MD .  pantoprazole (PROTONIX) EC tablet 40 mg, 40 mg, Oral, Daily, SJodell Cipro Prasanna, MD, 40 mg at 12/22/17 0900 .  scopolamine (TRANSDERM-SCOP) 1 MG/3DAYS 1.5 mg, 1 patch, Transdermal, Q72H, Sridharan, Prasanna, MD, 1.5 mg at 12/22/17 0904 .  senna-docusate (Senokot-S) tablet 1 tablet, 1 tablet, Oral, QHS PRN, SArta Silence MD .  traZODone (DESYREL) tablet 25-50 mg, 25-50 mg, Oral, QHS PRN, SArta Silence MD, 50 mg at 12/21/17 2118   Physical exam:  Vitals:   12/21/17 1619 12/21/17 2026 12/22/17 0437 12/22/17 1359  BP: (!) 143/80 119/85 138/81 (!) 152/87  Pulse: 100 92 92 (!) 105  Resp: _0 Temp: 98.1 F (36.7 C) 98.3 F (36.8 C) 98.1 F (36.7 C) 98.1 F (36.7 C)  TempSrc: Oral Oral Oral Oral  SpO2: 91% 93% 92% 95%  Weight:      Height:      Physical Exam  Constitutional: She is oriented to person, place, and time. No distress.  HENT:  Head: Normocephalic and atraumatic.  Mouth/Throat: Oropharynx is clear and moist.  Eyes: Pupils are equal, round, and reactive to light. EOM are normal. No scleral icterus.  Neck: Normal range of motion. Neck supple.  Cardiovascular:  Normal rate, regular rhythm and normal heart sounds.  No murmur heard. Pulmonary/Chest: Effort normal. No respiratory distress. She has no wheezes.   Diminished right lung base breath sound.  Abdominal: Soft. Bowel sounds are normal. She exhibits no distension and no mass. There is no tenderness.  Genitourinary:  Genitourinary Comments: Bilateral nephrostomy tubes  Musculoskeletal: Normal range of motion. She exhibits edema. She exhibits no deformity.  Neurological: She is alert and oriented to person, place, and time. No cranial nerve deficit. Coordination normal.  Skin: Skin is warm and dry. No rash noted. No erythema.  Psychiatric: She has a normal mood and affect. Her behavior is normal. Thought content normal.       CMP Latest Ref Rng & Units 12/22/2017  Glucose 70 - 99 mg/dL 113(H)  BUN 6 - 20 mg/dL 18  Creatinine 0.44 - 1.00 mg/dL 1.57(H)  Sodium 135 - 145 mmol/L 142  Potassium 3.5 - 5.1 mmol/L 3.7  Chloride 98 - 111 mmol/L 114(H)  CO2 22 - 32 mmol/L 20(L)  Calcium 8.9 - 10.3 mg/dL 7.8(L)  Total Protein 6.5 - 8.1 g/dL -  Total Bilirubin 0.3 - 1.2 mg/dL -  Alkaline Phos 38 - 126 U/L -  AST 15 - 41 U/L -  ALT 0 - 44 U/L -   CBC Latest Ref Rng & Units 12/22/2017  WBC 3.6 - 11.0 K/uL 7.7  Hemoglobin 12.0 - 16.0 g/dL 11.1(L)  Hematocrit 35.0 - 47.0 % 31.8(L)  Platelets 150 - 440 K/uL 326   RADIOGRAPHIC STUDIES: I have personally reviewed the radiological images as listed and agreed with the findings in the report. Dg Chest 2 View  Result Date: 12/20/2017 CLINICAL DATA:  Shortness of breath. Weakness. EXAM: CHEST - 2 VIEW COMPARISON:  12/02/2011 FINDINGS: The cardiac silhouette is normal in size. Left chest Port-A-Cath has been removed. Surgical clips are present in the axillae. There are new moderate right and small left pleural effusions with atelectasis or consolidation in both lung bases, particularly medially. No pneumothorax is identified. No acute osseous abnormality is seen. IMPRESSION: New moderate right and small left pleural effusions with bibasilar atelectasis or consolidation. Electronically Signed   By: Logan Bores M.D.   On: 12/20/2017 13:43   US Renal  Result Date: 12/18/2017 CLINICAL DATA:  Hydronephrosis aunt LOWER abdominal pain for 2-3 months. History of cystoscopy with bilateral stent placement 12/17/2017. EXAM: RENAL / URINARY TRACT ULTRASOUND COMPLETE COMPARISON:  CT of the abdomen and pelvis on 12/16/2017 FINDINGS: Right Kidney: Length: 12.1 centimeters. There is moderate hydronephrosis. Stent is visualized within the RIGHT renal pelvis. No solid or cystic renal mass. Left Kidney: Length: 12.7 centimeter. There is moderate hydronephrosis. The ureteral stent is not well seen in the LEFT renal pelvis. Bladder: Bladder is decompressed by a Foley catheter. Additional: There are bilateral pleural effusions.  Ascites. IMPRESSION: 1. Moderate bilateral hydronephrosis. 2. Foley catheter decompresses the bladder. 3. Bilateral pleural effusions.  Ascites. Electronically Signed   By: Nolon Nations M.D.   On: 12/18/2017 16:36   US Venous Img Lower Unilateral Left  Result Date: 12/16/2017 CLINICAL DATA:  Lower extremity edema EXAM: LEFT LOWER EXTREMITY VENOUS DUPLEX ULTRASOUND TECHNIQUE: Doppler venous assessment of the left lower extremity deep venous system was performed, including characterization of spectral flow, compressibility, and phasicity. COMPARISON:  None. FINDINGS: There is complete compressibility of the left common femoral and femoral veins. The left popliteal vein is partially occlusive and contains partially occlusive DVT. Thrombus is mixed echogenicity with areas  of hypoechoic and hyperechoic signal. There is occlusive thrombus in the posterior tibial and peroneal veins. Doppler analysis demonstrates respiratory phasicity and augmentation. IMPRESSION: The study is positive for DVT in the popliteal, posterior tibial, and peroneal veins as described. Critical Value/emergent results were called by telephone at the time of interpretation on 12/16/2017 at 9:24 am to Dr. Alfred Levins, who verbally  acknowledged these results. Electronically Signed   By: Marybelle Killings M.D.   On: 12/16/2017 09:25   Ct Renal Stone Study  Result Date: 12/16/2017 CLINICAL DATA:  57 year old female with abdominal pain. History of breast cancer and bilateral mastectomy. Patient is on chemo and radiation treatment. EXAM: CT ABDOMEN AND PELVIS WITHOUT CONTRAST TECHNIQUE: Multidetector CT imaging of the abdomen and pelvis was performed following the standard protocol without IV contrast. COMPARISON:  PET-CT dated 12/01/2011 FINDINGS: Evaluation of this exam is limited in the absence of intravenous contrast. Lower chest: Partially visualized small to moderate bilateral pleural effusions with associated partial compressive atelectasis of the adjacent lungs and areas of linear atelectasis/scarring. No intra-abdominal free air. Diffuse mesenteric edema and stranding and small ascites. Hepatobiliary: Subcentimeter hypodense focus in the right lobe of the liver is not characterized. The liver is otherwise unremarkable on this noncontrast CT. Probable tiny stone or sludge within the gallbladder. Pancreas: Unremarkable. No pancreatic ductal dilatation or surrounding inflammatory changes. Spleen: Normal in size without focal abnormality. Adrenals/Urinary Tract: Mild bilateral adrenal thickening. There is moderate bilateral hydronephrosis. The ureters are nondistended. The hydronephrosis is likely related to a degree of stricture at the ureteropelvic junctions secondary to retroperitoneal adhesions/fibrosis. No stone identified within the kidneys or along the course of the ureters. There is masslike thickening of the anterior and left lateral bladder wall likely metastatic disease. Stomach/Bowel: There is diffuse thickening of the stomach which may be partly related to underdistention and ascites. Gastritis or an infiltrative process is not entirely excluded. There is no bowel obstruction. The appendix is normal as visualized.  Vascular/Lymphatic: Mild aortoiliac atherosclerotic disease. The abdominal aorta and IVC are grossly unremarkable on this noncontrast CT. No portal venous gas. There are multiple somewhat small retroperitoneal and para-aortic lymph nodes. A mildly enlarged right iliac chain node measures 11 mm in short axis. There is diffuse mesenteric and omental nodularity and edema consistent with metastatic implants. Multiple top-normal and mildly rounded mesenteric lymph nodes measure up to 8 mm in short axis. Reproductive: Evaluation of the pelvic structures is very limited. The uterus appears retroflexed. A 3.3 x 3.9 x 2.6 cm ill-defined high attenuating solid lesion to the right of the uterine fundus is not well evaluated and may represent a fibroid or a complex lesion arising from the right adnexa. However, a neoplastic mass is not excluded. Pelvic ultrasound may provide better evaluation. Other: There is mild diffuse subcutaneous edema of the abdominal wall. Musculoskeletal: Multiple small scattered sclerotic lesions throughout the spine most consistent with metastatic disease. No acute fracture. IMPRESSION: 1. Moderate bilateral hydronephrosis, likely related to strictures at the level of the UPJ's, and likely secondary to retroperitoneal adhesions or fibrosis. No obstructing stone. 2. Masslike thickening of the anterior and left lateral bladder wall suspicious for metastatic implant. 3. Mesenteric, omental, and peritoneal implants with a small ascites and diffuse mesenteric edema. 4. Small to moderate bilateral pleural effusions with associated compressive atelectasis of the lungs. 5. Diffuse small osseous sclerotic metastatic disease. Electronically Signed   By: Anner Crete M.D.   On: 12/16/2017 06:53   Ir Nephrostomy Placement Left  Result  Date: 12/20/2017 INDICATION: Bilateral ureteral obstruction with occluded ureteral stents EXAM: IR NEPHROSTOMY PLACEMENT LEFT COMPARISON:  None. MEDICATIONS: 2 g IV Ancef;  The antibiotic was administered in an appropriate time frame prior to skin puncture. ANESTHESIA/SEDATION: Fentanyl 75 mcg IV; Versed 1 mg IV Moderate Sedation Time:  35 The patient was continuously monitored during the procedure by the interventional radiology nurse under my direct supervision. CONTRAST:  28m ISOVUE-300 IOPAMIDOL (ISOVUE-300) INJECTION 61% - administered into the collecting system(s) FLUOROSCOPY TIME:  Fluoroscopy Time: 5 minutes 48 seconds (356 mGy). COMPLICATIONS: None immediate. PROCEDURE: Informed written consent was obtained from the patient after a thorough discussion of the procedural risks, benefits and alternatives. All questions were addressed. Maximal Sterile Barrier Technique was utilized including caps, mask, sterile gowns, sterile gloves, sterile drape, hand hygiene and skin antiseptic. A timeout was performed prior to the initiation of the procedure. Utilizing 1% lidocaine as a local and deep anesthetic and fluoroscopic guidance, a 21 gauge needle was utilized to puncture the collecting system in the region of the left renal pelvis guided by a previously placed stent. Brown colored urine likely related to prior hemorrhage was withdrawn from the needle and contrast injection confirmed location within the collecting system. Subsequently a small amount of room air was injected to allow for delineation of posterior caliceal system. A lower pole posterior calyx was identified and again utilizing 1% xylocaine as local anesthetic and fluoroscopic guidance, a 20 gauge diamond tip needle was passed into the posterior calyx. Both air and urine were withdrawn from the needle and subsequently a 0.018 platinum tip guidewire was advanced into the collecting system. Over this, a 6 [place] dilator was placed into the collecting system and a J wire coiled within the renal pelvis. The axis sick dilator was then removed and the tract dilated to 8 [place] The dilator was then removed and a 8  French pigtail catheter was placed over the wire and coiled within the renal pelvis. The catheter was initially placed adjacent to the body of the stent although no significant drainage of urine was noted. Contrast material was injected which demonstrates a significant amount of thrombus within the collecting system and for this reason the catheter was advanced into the superior aspect of the renal pelvis where adequate urine drainage was noted. The catheter was then sutured into place utilizing 0 Prolene and a Hopkins buttress. Catheter was placed external gravity drain and bloody urine was obtained. The patient tolerated the catheter placement well and attention was then turned to the right collecting system. IMPRESSION: Successful placement of an 8 French pigtail catheter within the left renal collecting system. Thrombus is noted within the collecting system which may limit drainage somewhat. This should resolve over time. Electronically Signed   By: MInez CatalinaM.D.   On: 12/20/2017 08:02   Ir Nephrostomy Placement Right  Result Date: 12/20/2017 INDICATION: Ureteral occlusion with occluded ureteral stents EXAM: IR NEPHROSTOMY PLACEMENT RIGHT COMPARISON:  None. MEDICATIONS: 2 g IV Ancef; The antibiotic was administered in an appropriate time frame prior to skin puncture. ANESTHESIA/SEDATION: Fentanyl 75 mcg IV; Versed 1 mg IV Moderate Sedation Time:  35 The patient was continuously monitored during the procedure by the interventional radiology nurse under my direct supervision. CONTRAST:  245mISOVUE-300 IOPAMIDOL (ISOVUE-300) INJECTION 61% - administered into the collecting system(s) FLUOROSCOPY TIME:  Fluoroscopy Time: 5 minutes 48 seconds (356 mGy). COMPLICATIONS: None immediate. PROCEDURE: Informed written consent was obtained from the patient after a thorough discussion of the  procedural risks, benefits and alternatives. All questions were addressed. Maximal Sterile Barrier Technique was utilized  including caps, mask, sterile gowns, sterile gloves, sterile drape, hand hygiene and skin antiseptic. A timeout was performed prior to the initiation of the procedure. Utilizing 1% xylocaine as local anesthetic and fluoroscopic guidance a 21 gauge Chiba needle was placed into the right collecting system utilizing the pigtail from the previously placed ureteral stent as a guide. Brown tinged urine was withdrawn from the needle and subsequently a small amount of contrast was instilled to confirm needle placement. This demonstrates dilated collecting system. Subsequently a small amount of room air was instilled to allow for delineation of a posterior calyx. Again utilizing 1% xylocaine as local anesthetic and fluoroscopic guidance, a 20 gauge diamond tip needle was placed into the upper pole posterior calyx without difficulty. A 0.018 platinum tip guidewire was then advanced into the collecting system and coiled within the renal pelvis. A 6 French Accustick dilator was then placed over the microwire into the collecting system. The central dilator in stiff near were then removed and a J tipped guidewire was coiled within the renal pelvis. Dilatation to 10 [place] was then performed and a 10 French nephrostomy catheter placed over the guidewire into the renal pelvis. Brisk return of bloody urine was then noted. The catheter was then attached to an external gravity bag. The catheter was sutured into place utilizing 0 Prolene and a Hopkins buttress. Patient tolerated the procedure well and was returned to her room in satisfactory condition. IMPRESSION: Successful right percutaneous nephrostomy placement as described above. Bloody urine was noted at the termination of the procedure which should clear over time. Electronically Signed   By: Inez Catalina M.D.   On: 12/20/2017 08:06    Assessment and plan-  Patient is a 57 y.o. female  female with history of Stage III right breast cancer, ER/PR positive and HER2 negative,  s/p chemotherapy, B/L mastectomy, adjuvant RT and on extended Aromasin and Zolodex presents with abdominal pain  #Acute renal failure, creatinine continue to trend down, close to baseline.  Discontinue hydration.  She will follow-up outpatient with Dr. Erlene Quan.  # Hypercalcemia, today's calcium level 7.8.  Continue monitor.  #Left lower extremity below-knee acute DVT, in the context of widely metastatic malignancy,  given that she does not have any additional invasive procedure inpatient, agree with stopping heparin and transition to Eliquis.  #Stage IV metastatic breast cancer, with visceral crisis. Recommend starting palliative chemotherapy as soon as possible in order to achieve quick tumor burden reduction. To avoid further delay outpatient, recommend proceeding with cycle 1 1 palliative chemotherapy with Taxol weekly 80 mg/m today..  Plan 2-3 cycles of chemotherapy and switch to endocrine treatment/ CDK 4/6 treatment.  She will see me outpatient on 12/25/2017 for reassessment and will schedule patient to have Mediport placement and chemotherapy class. We will also arrange MRI brain outpatient.  #Bilateral lower extremity swelling, likely secondary to renal function impairment and she has received lots of fluid during this admission.  #Pleural effusion, right more than left.  Currently patient is asymptomatic saturation well on room air, asymptomatic.  I will obtain an outpatient 2D Echo to further access her heat function given previous anthracycline exposure.   If she tolerates Taxol treatment today, she maybe released home today or early AM.  I have sent antiemetics to her pharmacy and has discussed with her about instructions if she needs any.   Thank you for allowing me to participate in  the care of this patient. Discussed with Dr.Wieting.   Earlie Server, MD, PhD Hematology Oncology [facility] Pager- [phone] 12/22/2017

## 2017-12-22 NOTE — Progress Notes (Signed)
Urology Consult Follow Up  Subjective: Creatinine now back to baseline upon arrival to the hospital, but not back to baseline from 3 months ago.  Good urine output.  Tolerating nephrostomies well.    Anti-infectives: Anti-infectives (From admission, onward)   Start     Dose/Rate Route Frequency Ordered Stop   12/19/17 1645  ceFAZolin (ANCEF) IVPB 1 g/50 mL premix     1 g 100 mL/hr over 30 Minutes Intravenous On call 12/19/17 1636 12/19/17 1725   12/17/17 0600  ceFAZolin (ANCEF) IVPB 2g/100 mL premix     2 g 200 mL/hr over 30 Minutes Intravenous  Once 12/16/17 1516 12/17/17 0605      Current Facility-Administered Medications  Medication Dose Route Frequency Provider Last Rate Last Dose  . 0.9 %  sodium chloride infusion   Intravenous Once Earlie Server, MD      . acetaminophen (TYLENOL) tablet 650 mg  650 mg Oral Q6H PRN Arta Silence, MD   650 mg at 12/22/17 0053   Or  . acetaminophen (TYLENOL) suppository 650 mg  650 mg Rectal Q6H PRN Arta Silence, MD      . amLODipine (NORVASC) tablet 5 mg  5 mg Oral Daily Arta Silence, MD   5 mg at 12/22/17 0901  . apixaban (ELIQUIS) tablet 10 mg  10 mg Oral BID Loletha Grayer, MD   10 mg at 12/22/17 0900   Followed by  . [START ON 12/29/2017] apixaban (ELIQUIS) tablet 5 mg  5 mg Oral BID Wieting, Richard, MD      . bisacodyl (DULCOLAX) EC tablet 5 mg  5 mg Oral Daily PRN Arta Silence, MD   5 mg at 12/19/17 1006  . dexamethasone (DECADRON) 10 mg in sodium chloride 0.9 % 50 mL IVPB  10 mg Intravenous Once Earlie Server, MD      . diazepam (VALIUM) tablet 10 mg  10 mg Oral QHS PRN Arta Silence, MD      . diphenhydrAMINE (BENADRYL) injection 50 mg  50 mg Intravenous Once Earlie Server, MD      . famotidine (PEPCID) IVPB 20 mg premix  20 mg Intravenous Once Earlie Server, MD      . HYDROmorphone (DILAUDID) injection 0.5-1 mg  0.5-1 mg Intravenous Q3H PRN Arta Silence, MD   1 mg at 12/22/17 0938  . ondansetron (ZOFRAN) tablet 4  mg  4 mg Oral Q6H PRN Arta Silence, MD       Or  . ondansetron (ZOFRAN) injection 4 mg  4 mg Intravenous Q6H PRN Arta Silence, MD   4 mg at 12/22/17 0859  . opium-belladonna (B&O SUPPRETTES) 16.2-60 MG suppository 1 suppository  1 suppository Rectal Q8H PRN Hollice Espy, MD      . oxybutynin (DITROPAN) tablet 5 mg  5 mg Oral Q8H PRN Hollice Espy, MD   5 mg at 12/20/17 2124  . PACLitaxel (TAXOL) 144 mg in sodium chloride 0.9 % 250 mL chemo infusion (</= 80mg /m2)  80 mg/m2 (Treatment Plan Recorded) Intravenous Once Earlie Server, MD      . pantoprazole (PROTONIX) EC tablet 40 mg  40 mg Oral Daily Arta Silence, MD   40 mg at 12/22/17 0900  . scopolamine (TRANSDERM-SCOP) 1 MG/3DAYS 1.5 mg  1 patch Transdermal Q72H Arta Silence, MD   1.5 mg at 12/22/17 0904  . senna-docusate (Senokot-S) tablet 1 tablet  1 tablet Oral QHS PRN Arta Silence, MD      . traZODone (DESYREL) tablet 25-50 mg  25-50 mg Oral QHS  PRN Arta Silence, MD   50 mg at 12/21/17 2118     Objective: Vital signs in last 24 hours: Temp:  [98.1 F (36.7 C)-98.3 F (36.8 C)] 98.1 F (36.7 C) (10/04 0437) Pulse Rate:  [92-100] 92 (10/04 0437) Resp:  [14-18] 15 (10/04 0437) BP: (119-143)/(80-85) 138/81 (10/04 0437) SpO2:  [91 %-93 %] 92 % (10/04 0437)  Intake/Output from previous day: 10/03 0701 - 10/04 0700 In: 896.9 [P.O.:360; I.V.:296.9] Out: 1750 [Urine:1750] Intake/Output this shift: Total I/O In: -  Out: 350 [Urine:350]   Physical Exam Constitutional: Well nourished. Alert and oriented, No acute distress. HEENT: [place] AT, moist mucus membranes. Trachea midline, no masses. Cardiovascular: No clubbing, cyanosis, or edema. Respiratory: Normal respiratory effort, no increased work of breathing. GU: No CVA tenderness.  No bladder fullness or masses.  Right nephrostomy with light pink urine.  Left nephrostomy tube with pink urine.   Skin: No rashes, bruises or suspicious  lesions. Neurologic: Grossly intact, no focal deficits, moving all 4 extremities. Psychiatric: Normal mood and affect.  Lab Results:  Recent Labs    12/21/17 0552 12/22/17 0513  WBC 7.4 7.7  HGB 10.5* 11.1*  HCT 29.0* 31.8*  PLT 263 326   BMET Recent Labs    12/21/17 0552 12/22/17 0513  NA 139 142  K 3.1* 3.7  CL 111 114*  CO2 20* 20*  GLUCOSE 117* 113*  BUN 23* 18  CREATININE 2.16* 1.57*  CALCIUM 7.8* 7.8*   PT/INR No results for input(s): LABPROT, INR in the last 72 hours. ABG No results for input(s): PHART, HCO3 in the last 72 hours.  Invalid input(s): PCO2, PO2  Studies/Results: Dg Chest 2 View  Result Date: 12/20/2017 CLINICAL DATA:  Shortness of breath. Weakness. EXAM: CHEST - 2 VIEW COMPARISON:  12/02/2011 FINDINGS: The cardiac silhouette is normal in size. Left chest Port-A-Cath has been removed. Surgical clips are present in the axillae. There are new moderate right and small left pleural effusions with atelectasis or consolidation in both lung bases, particularly medially. No pneumothorax is identified. No acute osseous abnormality is seen. IMPRESSION: New moderate right and small left pleural effusions with bibasilar atelectasis or consolidation. Electronically Signed   By: Logan Bores M.D.   On: 12/20/2017 13:43     Assessment and Plan: Patient with stage III breast cancer that is now diffusely metastasized involving retroperitoneum, omentum, bone, possibly liver and bladder with bilateral ureteral obstruction s/p failed bilateral ureteral stents with with bilateral nephrostomy tube normalization of renal function..  1.  Bilateral hydroureteronephrosis S/p bilateral stent placement with bladder biopsies on 12/17/2017  Patient underwent bilateral nephrostomy tube placement 12/19/2017 Bladder biopsies consistent with metastatic breast cancer UOP with the nephrostomy tubes is good Creatinine trending down  Outpatient follow-up has been arranged for next  week for cystoscopy, stent removal.  I reviewed discharge instructions for her nephrostomy tubes, flush as needed and may shower with occlusive dressing.  All questions answered.  Urology will sign off, please page with any questions or concerns.    LOS: 6 days    Hollice Espy 12/22/2017

## 2017-12-22 NOTE — Progress Notes (Signed)
[facility]  ROUNDING NOTE   Subjective:  Patient feeling better. Creatinine down to 1.6. Good urine output noted from nephrostomies.   Objective:  Vital signs in last 24 hours:  Temp:  [98.1 F (36.7 C)-98.3 F (36.8 C)] 98.1 F (36.7 C) (10/04 0437) Pulse Rate:  [92-100] 92 (10/04 0437) Resp:  [14-18] 15 (10/04 0437) BP: (119-143)/(80-85) 138/81 (10/04 0437) SpO2:  [91 %-93 %] 92 % (10/04 0437)  Weight change:  Filed Weights   12/16/17 0318 12/16/17 1225  Weight: 72.6 kg 72.5 kg    Intake/Output: I/O last 3 completed shifts: In: 1139.5 [P.O.:360; I.V.:539.5; Other:240] Out: 2122 [Urine:3275]   Intake/Output this shift:  No intake/output data recorded.  Physical Exam: General: No acute distress  Head: Normocephalic, atraumatic. Moist oral mucosal membranes  Eyes: Anicteric  Neck: Supple, trachea midline  Lungs:  Clear to auscultation, normal effort  Heart: S1S2 no rubs  Abdomen:  Soft, nontender, bowel sounds present  Extremities: 2+ peripheral edema.  Neurologic: Awake, alert, following commands  Skin: No lesions       Basic Metabolic Panel: Recent Labs  Lab 12/16/17 0332  12/18/17 0649 12/19/17 0620 12/20/17 0415 12/21/17 0552 12/22/17 0513  NA 139   < > 141 137 139 139 142  K 3.0*   < > 3.3* 3.2* 3.2* 3.1* 3.7  CL 102   < > 109 109 110 111 114*  CO2 28   < > 23 20* 17* 20* 20*  GLUCOSE 113*   < > 113* 133* 119* 117* 113*  BUN 30*   < > 22* 30* 33* 23* 18  CREATININE 1.63*   < > 2.31* 4.78* 4.27* 2.16* 1.57*  CALCIUM 13.9*   < > 9.6 8.3* 8.0* 7.8* 7.8*  MG 2.1  --   --   --   --   --   --   PHOS 3.1  --   --   --   --   --   --    < > = values in this interval not displayed.    Liver Function Tests: Recent Labs  Lab 12/15/17 1525 12/16/17 0332  AST 36* 39  ALT 37* 43  ALKPHOS  --  110  BILITOT 0.3 0.6  PROT 6.4 6.9  ALBUMIN  --  3.8   No results for input(s): LIPASE, AMYLASE in the last 168 hours. No results for  input(s): AMMONIA in the last 168 hours.  CBC: Recent Labs  Lab 12/18/17 0649 12/19/17 0620 12/20/17 0415 12/21/17 0552 12/22/17 0513  WBC 10.3 9.2 9.4 7.4 7.7  HGB 11.7* 10.8* 11.3* 10.5* 11.1*  HCT 33.8* 31.4* 32.3* 29.0* 31.8*  MCV 93.2 93.0 93.0 92.3 92.0  PLT 230 217 238 263 326    Cardiac Enzymes: No results for input(s): CKTOTAL, CKMB, CKMBINDEX, TROPONINI in the last 168 hours.  BNP: Invalid input(s): POCBNP  CBG: No results for input(s): GLUCAP in the last 168 hours.  Microbiology: Results for orders placed or performed in visit on 11/10/15  CULTURE, URINE COMPREHENSIVE     Status: None   Collection Time: 11/10/15  2:41 PM  Result Value Ref Range Status   Culture ESCHERICHIA COLI  Final   Colony Count >=100,000 COLONIES/ML  Final   Organism ID, Bacteria ESCHERICHIA COLI  Final      Susceptibility   Escherichia coli -  (no method available)    AMPICILLIN 4 Sensitive     AMOX/CLAVULANIC <=2 Sensitive     AMPICILLIN/SULBACTAM <=  2 Sensitive     PIP/TAZO <=4 Sensitive     IMIPENEM <=0.25 Sensitive     CEFAZOLIN  Sensitive     CEFTRIAXONE <=1 Sensitive     CEFTAZIDIME <=1 Sensitive     CEFEPIME <=1 Sensitive     GENTAMICIN <=1 Sensitive     TOBRAMYCIN <=1 Sensitive     CIPROFLOXACIN <=0.25 Sensitive     LEVOFLOXACIN <=0.12 Sensitive     NITROFURANTOIN <=16 Sensitive     TRIMETH/SULFA* <=20 Sensitive      * NR=NOT REPORTABLE,SEE COMMENTORAL therapy:A cefazolin MIC of <32 predicts susceptibility to the oral agents cefaclor,cefdinir,cefpodoxime,cefprozil,cefuroxime,cephalexin,and loracarbef when used for therapy of uncomplicated UTIs due to E.coli,K.pneumomiae,and P.mirabilis. PARENTERAL therapy: A cefazolinMIC of >8 indicates resistance to parenteralcefazolin. An alternate test method must beperformed to confirm susceptibility to parenteralcefazolin.    Coagulation Studies: No results for input(s): LABPROT, INR in the last 72 hours.  Urinalysis: No results  for input(s): COLORURINE, LABSPEC, PHURINE, GLUCOSEU, HGBUR, BILIRUBINUR, KETONESUR, PROTEINUR, UROBILINOGEN, NITRITE, LEUKOCYTESUR in the last 72 hours.  Invalid input(s): APPERANCEUR    Imaging: Dg Chest 2 View  Result Date: 12/20/2017 CLINICAL DATA:  Shortness of breath. Weakness. EXAM: CHEST - 2 VIEW COMPARISON:  12/02/2011 FINDINGS: The cardiac silhouette is normal in size. Left chest Port-A-Cath has been removed. Surgical clips are present in the axillae. There are new moderate right and small left pleural effusions with atelectasis or consolidation in both lung bases, particularly medially. No pneumothorax is identified. No acute osseous abnormality is seen. IMPRESSION: New moderate right and small left pleural effusions with bibasilar atelectasis or consolidation. Electronically Signed   By: Logan Bores M.D.   On: 12/20/2017 13:43     Medications:   . sodium chloride    . dexamethasone (DECADRON) IVPB [facility]    . famotidine (PEPCID) IV     . amLODipine  5 mg Oral Daily  . apixaban  10 mg Oral BID   Followed by  . [START ON 12/29/2017] apixaban  5 mg Oral BID  . diphenhydrAMINE  50 mg Intravenous Once  . PACLitaxel  80 mg/m2 (Treatment Plan Recorded) Intravenous Once  . pantoprazole  40 mg Oral Daily  . scopolamine  1 patch Transdermal Q72H   acetaminophen **OR** acetaminophen, bisacodyl, diazepam, HYDROmorphone (DILAUDID) injection, ondansetron **OR** ondansetron (ZOFRAN) IV, opium-belladonna, oxybutynin, senna-docusate, traZODone  Assessment/ Plan:  57 y.o. female  with a PMHx of breast cancer status post bilateral mastectomy with chemotherapy and radiation therapy, anxiety, GERD, irritable bowel syndrome, history of neutropenia, plantar fasciitis, who was admitted to [facility] on 12/16/2017 for evaluation of abnormal lab values including hypercalcemia as well as acute renal failure.   1.  Acute renal failure due to obstructive uropathy. 2.  Bilateral hydronephrosis. 3.   Hypokalemia.  4.  Bladder mass with bony metastasis.  5.  Anemia unspecified.   Plan:  Patient doing significantly better as compared to admission.  BUN down to 18 with a creatinine of 1.57.  No acute indication for dialysis.  Patient will be maintained with nephrostomies.  Recommend follow-up in our clinic to follow-up her renal function sometime within the next 2 weeks.  Hyperkalemia also corrected a serum potassium up to 3.7.  Otherwise treatment of bladder mass per oncology.   LOS: 6 Dax Murguia 10/4/201911:59 AM

## 2017-12-22 NOTE — Progress Notes (Addendum)
ANTICOAGULATION CONSULT NOTE - Initial Consult  Pharmacy Consult for Eliquis Indication: DVT  Allergies  Allergen Reactions  . Adhesive [Tape] Other (See Comments)    Redness:Please use paper tape    Patient Measurements: Height: '5\' 4"'$  (162.6 cm) Weight: 159 lb 12.8 oz (72.5 kg) IBW/kg (Calculated) : 54.7 Heparin Dosing Weight:   Vital Signs: Temp: 98.1 F (36.7 C) (10/04 0437) Temp Source: Oral (10/04 0437) BP: 138/81 (10/04 0437) Pulse Rate: 92 (10/04 0437)  Labs: Recent Labs    12/20/17 0415  12/20/17 2313 12/21/17 0552 12/22/17 0513  HGB 11.3*  --   --  10.5* 11.1*  HCT 32.3*  --   --  29.0* 31.8*  PLT 238  --   --  263 326  HEPARINUNFRC 0.13*   < > 0.39 0.40 0.36  CREATININE 4.27*  --   --  2.16* 1.57*   < > = values in this interval not displayed.    Estimated Creatinine Clearance: 39 mL/min (A) (by C-G formula based on SCr of 1.57 mg/dL (H)).   Medical History: Past Medical History:  Diagnosis Date  . Anxiety    takes Xanax prn  . Breast cancer [facility]) August 2013   bilateral, er/pr+  . Complication of anesthesia    pt states she had the shakes extremely bad  . Cough    at night d/t reflux;nothing productive  . Dental crowns present    x 4  . GERD (gastroesophageal reflux disease)    takes Nexium daily  . History of bronchitis    08/2011  . History of colon polyps   . Hot flashes, menopausal 08/08/2012  . IBS (irritable bowel syndrome)    immodium prn  . Insomnia    takes Ambien nightly  . Irritable bowel syndrome   . Neutropenia, drug-induced ([place]) 12/15/2011  . Nocturia   . Plantar fasciitis   . S/P radiation therapy 07/03/2012 through 08/18/2038                                                        Right chest wall/regional lymph nodes 5040 cGy 28 sessions, right chest wall/mastectomy scar boost 1000 cGy 5 sessions                          . Status post chemotherapy    FEC 100 starting on 12/08/2011 - 01/18/12/single agent Taxol x12 weeks  from 02/02/2012 through January 2014   . Urinary frequency   . Use of tamoxifen (Nolvadex)   . Vertigo     Medications:  Infusions:  . heparin 1,400 Units/hr (12/22/17 7867)    Assessment: 15 yof admitted with abdominal pain. PMH significant for stage 3 right breast cancer ER/PR positive and HER2 negative. DVT noted this admission, patient has been on UFH infusion. Today pharmacy is consulted to switch to apixaban dosing for DVT. Patient is now s/p nephrostomy with tube placement. Urology is following and says UOP is good an SCr trending down. DOACs should not be used in acute kidney injury, but it appears this AKI episode is resolved. Begin apixaban dosing.  Goal of Therapy:  Monitor platelets by anticoagulation protocol: Yes   Plan:  Apixaban10 mg po BID x 7 days followed by apixaban 5 mg po BID. Give first dose of  apixaban and discontinue the heparin infusion at the same time per protocol. Pharmacy will continue to monitor.   Laural Benes, Pharm.D., BCPS Clinical Pharmacist 12/22/2017,7:42 AM

## 2017-12-25 ENCOUNTER — Inpatient Hospital Stay: Payer: 59 | Attending: Oncology

## 2017-12-25 ENCOUNTER — Other Ambulatory Visit: Payer: Self-pay

## 2017-12-25 ENCOUNTER — Inpatient Hospital Stay: Payer: 59

## 2017-12-25 ENCOUNTER — Encounter: Payer: Self-pay | Admitting: Lab

## 2017-12-25 ENCOUNTER — Encounter: Payer: Self-pay | Admitting: Oncology

## 2017-12-25 ENCOUNTER — Inpatient Hospital Stay: Payer: 59 | Admitting: Oncology

## 2017-12-25 VITALS — BP 117/70 | HR 90 | Temp 98.2°F | Resp 16 | Wt 178.2 lb

## 2017-12-25 DIAGNOSIS — Z936 Other artificial openings of urinary tract status: Secondary | ICD-10-CM | POA: Diagnosis not present

## 2017-12-25 DIAGNOSIS — D6489 Other specified anemias: Secondary | ICD-10-CM | POA: Insufficient documentation

## 2017-12-25 DIAGNOSIS — R740 Nonspecific elevation of levels of transaminase and lactic acid dehydrogenase [LDH]: Secondary | ICD-10-CM | POA: Insufficient documentation

## 2017-12-25 DIAGNOSIS — R42 Dizziness and giddiness: Secondary | ICD-10-CM | POA: Insufficient documentation

## 2017-12-25 DIAGNOSIS — I82459 Acute embolism and thrombosis of unspecified peroneal vein: Secondary | ICD-10-CM

## 2017-12-25 DIAGNOSIS — Z5111 Encounter for antineoplastic chemotherapy: Secondary | ICD-10-CM | POA: Insufficient documentation

## 2017-12-25 DIAGNOSIS — N179 Acute kidney failure, unspecified: Secondary | ICD-10-CM | POA: Diagnosis not present

## 2017-12-25 DIAGNOSIS — C7911 Secondary malignant neoplasm of bladder: Secondary | ICD-10-CM

## 2017-12-25 DIAGNOSIS — C50411 Malignant neoplasm of upper-outer quadrant of right female breast: Secondary | ICD-10-CM | POA: Insufficient documentation

## 2017-12-25 DIAGNOSIS — R509 Fever, unspecified: Secondary | ICD-10-CM | POA: Insufficient documentation

## 2017-12-25 DIAGNOSIS — C50919 Malignant neoplasm of unspecified site of unspecified female breast: Secondary | ICD-10-CM

## 2017-12-25 DIAGNOSIS — I824Y2 Acute embolism and thrombosis of unspecified deep veins of left proximal lower extremity: Secondary | ICD-10-CM

## 2017-12-25 DIAGNOSIS — R601 Generalized edema: Secondary | ICD-10-CM

## 2017-12-25 DIAGNOSIS — Z9221 Personal history of antineoplastic chemotherapy: Secondary | ICD-10-CM

## 2017-12-25 DIAGNOSIS — F5089 Other specified eating disorder: Secondary | ICD-10-CM | POA: Insufficient documentation

## 2017-12-25 DIAGNOSIS — Z9013 Acquired absence of bilateral breasts and nipples: Secondary | ICD-10-CM | POA: Diagnosis not present

## 2017-12-25 DIAGNOSIS — E8809 Other disorders of plasma-protein metabolism, not elsewhere classified: Secondary | ICD-10-CM | POA: Diagnosis not present

## 2017-12-25 DIAGNOSIS — D649 Anemia, unspecified: Secondary | ICD-10-CM

## 2017-12-25 DIAGNOSIS — Z7901 Long term (current) use of anticoagulants: Secondary | ICD-10-CM

## 2017-12-25 DIAGNOSIS — Z79899 Other long term (current) drug therapy: Secondary | ICD-10-CM

## 2017-12-25 DIAGNOSIS — C50412 Malignant neoplasm of upper-outer quadrant of left female breast: Secondary | ICD-10-CM

## 2017-12-25 LAB — COMPREHENSIVE METABOLIC PANEL
ALK PHOS: 159 U/L — AB (ref 38–126)
ALT: 192 U/L — AB (ref 0–44)
AST: 202 U/L — ABNORMAL HIGH (ref 15–41)
Albumin: 3 g/dL — ABNORMAL LOW (ref 3.5–5.0)
Anion gap: 8 (ref 5–15)
BILIRUBIN TOTAL: 0.5 mg/dL (ref 0.3–1.2)
BUN: 16 mg/dL (ref 6–20)
CALCIUM: 7.3 mg/dL — AB (ref 8.9–10.3)
CO2: 21 mmol/L — ABNORMAL LOW (ref 22–32)
CREATININE: 1.09 mg/dL — AB (ref 0.44–1.00)
Chloride: 112 mmol/L — ABNORMAL HIGH (ref 98–111)
GFR calc Af Amer: >60 mL/min (ref >60)
GFR calc non Af Amer: 56 mL/min — ABNORMAL LOW (ref >60)
GLUCOSE: 115 mg/dL — AB (ref 70–99)
Potassium: 3.3 mmol/L — ABNORMAL LOW (ref 3.5–5.1)
Sodium: 141 mmol/L (ref 135–145)
Total Protein: 6.5 g/dL (ref 6.5–8.1)

## 2017-12-25 LAB — CBC WITH DIFFERENTIAL/PLATELET
BASOS PCT: 0 %
Basophils Absolute: 0 10*3/uL (ref 0–0.1)
EOS ABS: 0 10*3/uL (ref 0–0.7)
EOS PCT: 0 %
HEMATOCRIT: 30.3 % — AB (ref 35.0–47.0)
Hemoglobin: 10.7 g/dL — ABNORMAL LOW (ref 12.0–16.0)
Lymphocytes Relative: 12 %
Lymphs Abs: 0.9 10*3/uL — ABNORMAL LOW (ref 1.0–3.6)
MCH: 32 pg (ref 26.0–34.0)
MCHC: 35.2 g/dL (ref 32.0–36.0)
MCV: 91.1 fL (ref 80.0–100.0)
MONO ABS: 0.2 10*3/uL (ref 0.2–0.9)
MONOS PCT: 3 %
Neutro Abs: 6.5 10*3/uL (ref 1.4–6.5)
Neutrophils Relative %: 85 %
PLATELETS: 355 10*3/uL (ref 150–440)
RBC: 3.33 MIL/uL — ABNORMAL LOW (ref 3.80–5.20)
RDW: 13.1 % (ref 11.5–14.5)
WBC: 7.7 10*3/uL (ref 3.6–11.0)

## 2017-12-25 MED ORDER — POTASSIUM CHLORIDE CRYS ER 20 MEQ PO TBCR: 20.0000 meq | EXTENDED_RELEASE_TABLET | Freq: Every day | ORAL | 0 refills | Status: DC

## 2017-12-25 MED ORDER — FUROSEMIDE 20 MG PO TABS: 20.0000 mg | ORAL_TABLET | Freq: Every day | ORAL | 0 refills | Status: DC | PRN

## 2017-12-25 NOTE — Patient Instructions (Signed)
Paclitaxel injection What is this medicine? PACLITAXEL (PAK li TAX el) is a chemotherapy drug. It targets fast dividing cells, like cancer cells, and causes these cells to die. This medicine is used to treat ovarian cancer, breast cancer, and other cancers. This medicine may be used for other purposes; ask your health care provider or pharmacist if you have questions. COMMON BRAND NAME(S): Onxol, Taxol What should I tell my health care provider before I take this medicine? They need to know if you have any of these conditions: -blood disorders -irregular heartbeat -infection (especially a virus infection such as chickenpox, cold sores, or herpes) -liver disease -previous or ongoing radiation therapy -an unusual or allergic reaction to paclitaxel, alcohol, polyoxyethylated castor oil, other chemotherapy agents, other medicines, foods, dyes, or preservatives -pregnant or trying to get pregnant -breast-feeding How should I use this medicine? This drug is given as an infusion into a vein. It is administered in a hospital or clinic by a specially trained health care professional. Talk to your pediatrician regarding the use of this medicine in children. Special care may be needed. Overdosage: If you think you have taken too much of this medicine contact a poison control center or emergency room at once. NOTE: This medicine is only for you. Do not share this medicine with others. What if I miss a dose? It is important not to miss your dose. Call your doctor or health care professional if you are unable to keep an appointment. What may interact with this medicine? Do not take this medicine with any of the following medications: -disulfiram -metronidazole This medicine may also interact with the following medications: -cyclosporine -diazepam -ketoconazole -medicines to increase blood counts like filgrastim, pegfilgrastim, sargramostim -other chemotherapy drugs like cisplatin, doxorubicin,  epirubicin, etoposide, teniposide, vincristine -quinidine -testosterone -vaccines -verapamil Talk to your doctor or health care professional before taking any of these medicines: -acetaminophen -aspirin -ibuprofen -ketoprofen -naproxen This list may not describe all possible interactions. Give your health care provider a list of all the medicines, herbs, non-prescription drugs, or dietary supplements you use. Also tell them if you smoke, drink alcohol, or use illegal drugs. Some items may interact with your medicine. What should I watch for while using this medicine? Your condition will be monitored carefully while you are receiving this medicine. You will need important blood work done while you are taking this medicine. This medicine can cause serious allergic reactions. To reduce your risk you will need to take other medicine(s) before treatment with this medicine. If you experience allergic reactions like skin rash, itching or hives, swelling of the face, lips, or tongue, tell your doctor or health care professional right away. In some cases, you may be given additional medicines to help with side effects. Follow all directions for their use. This drug may make you feel generally unwell. This is not uncommon, as chemotherapy can affect healthy cells as well as cancer cells. Report any side effects. Continue your course of treatment even though you feel ill unless your doctor tells you to stop. Call your doctor or health care professional for advice if you get a fever, chills or sore throat, or other symptoms of a cold or flu. Do not treat yourself. This drug decreases your body's ability to fight infections. Try to avoid being around people who are sick. This medicine may increase your risk to bruise or bleed. Call your doctor or health care professional if you notice any unusual bleeding. Be careful brushing and flossing your teeth or   using a toothpick because you may get an infection or  bleed more easily. If you have any dental work done, tell your dentist you are receiving this medicine. Avoid taking products that contain aspirin, acetaminophen, ibuprofen, naproxen, or ketoprofen unless instructed by your doctor. These medicines may hide a fever. Do not become pregnant while taking this medicine. Women should inform their doctor if they wish to become pregnant or think they might be pregnant. There is a potential for serious side effects to an unborn child. Talk to your health care professional or pharmacist for more information. Do not breast-feed an infant while taking this medicine. Men are advised not to father a child while receiving this medicine. This product may contain alcohol. Ask your pharmacist or healthcare provider if this medicine contains alcohol. Be sure to tell all healthcare providers you are taking this medicine. Certain medicines, like metronidazole and disulfiram, can cause an unpleasant reaction when taken with alcohol. The reaction includes flushing, headache, nausea, vomiting, sweating, and increased thirst. The reaction can last from 30 minutes to several hours. What side effects may I notice from receiving this medicine? Side effects that you should report to your doctor or health care professional as soon as possible: -allergic reactions like skin rash, itching or hives, swelling of the face, lips, or tongue -low blood counts - This drug may decrease the number of white blood cells, red blood cells and platelets. You may be at increased risk for infections and bleeding. -signs of infection - fever or chills, cough, sore throat, pain or difficulty passing urine -signs of decreased platelets or bleeding - bruising, pinpoint red spots on the skin, black, tarry stools, nosebleeds -signs of decreased red blood cells - unusually weak or tired, fainting spells, lightheadedness -breathing problems -chest pain -high or low blood pressure -mouth sores -nausea and  vomiting -pain, swelling, redness or irritation at the injection site -pain, tingling, numbness in the hands or feet -slow or irregular heartbeat -swelling of the ankle, feet, hands Side effects that usually do not require medical attention (report to your doctor or health care professional if they continue or are bothersome): -bone pain -complete hair loss including hair on your head, underarms, pubic hair, eyebrows, and eyelashes -changes in the color of fingernails -diarrhea -loosening of the fingernails -loss of appetite -muscle or joint pain -red flush to skin -sweating This list may not describe all possible side effects. Call your doctor for medical advice about side effects. You may report side effects to FDA at 1-800-FDA-1088. Where should I keep my medicine? This drug is given in a hospital or clinic and will not be stored at home. NOTE: This sheet is a summary. It may not cover all possible information. If you have questions about this medicine, talk to your doctor, pharmacist, or health care provider.  2018 Elsevier/Gold Standard (2015-01-06 19:58:00)  

## 2017-12-25 NOTE — Progress Notes (Signed)
Patient here today as a new patient  

## 2017-12-25 NOTE — Progress Notes (Signed)
Hematology/Oncology Follow Up Note [facility]  Telephone:([phone] [phone]  Patient Care Team: [name], MD as PCP - General (Internal Medicine) Serena Colonel, RN as [place] Management   Name of the patient: Mary [name]  [id number]  1960-09-10   REASON FOR VISIT  metastatic breast cancer.  Pertinent oncology history # She was diagnosed with right upper quadrant breast in August 2013, status post neoadjuvant chemotherapy with FEC x4 followed by weekly Taxol x2, underwent bilateral mastectomy [February 2014], right ALND, and left sentinel LN biopsy.  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  Tis N0.  Patient also received adjuvant radiation.   Patient was started initially on tamoxifen plus ovarian suppression with Zoladex in June 2014,  and changed to Aromasin plus Zoladex monthly since June 2015.follows up with Dr.Gudina Loleta Dicker at [facility] in [place]. reports that she twisted and sprained her left ankle when getting out of her car on 12/11/2017.  Ankle has been swollen.  #Patient presented to emergency room due to abnormal lab values.  Calcium level was 13.9 with creatinine 1.63.  Also endorses right lower quadrant/right flank/right lower back pain for the past couple of days.  Also having had episodes of dizziness which were diagnosed as vertigo since April 2019.  She had a negative MRI brain done at that time which were essentially negative for acute findings.  In the emergency room CT renal stone study were independently reviewed by me which showed bilateral hydronephrosis, due to the stricture at the level of UPJ's.  Masslike thickening of the anterior and the lateral bladder wall suspicious for metastatic implant.  Mesenteric, omental, peritoneal implants with small ascites and diffuse mesenteric edema.  Small  to moderate bilateral pleural effusion with associated compressive atelectasis of the lung.  Diffuse small osseous skeletal but stated disease.  Patient was seen and evaluated by urology Dr. Erlene Quan.  She is status post bilateral ureter stent placement and infiltrative bladder mass biopsy. Unfortunately her creatinine increased further during the admission and eventually she got bilateral percutaneous nephrostomy tube placed.  Creatinine gradually gets better after hydration. For her hypercalcemia, patient received 1 dose of calcitonin and 1 dose of Zometa.  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 She has visceral crisis with metastatic breast cancer.  In order to avoid further delay outpatient, she was started on first dose of Taxol 80 mg/m on 12/22/2017 after detailed discussion of chemotherapy side effects and complications.  #Left lower extremity DVT, she was anticoagulated with heparin drip.  Switched to Eliquis 5 mg twice daily outpatient.  INTERVAL HISTORY 57 year old female with newly diagnosed metastatic breast lobular carcinoma presents for follow-up to assess toxicity from chemotherapy as well as symptom management. Problems and complaints listed as below.  #Chemotherapy, reports mild nausea, manageable with antiemetics at home.  No vomiting.  Appetite is fair.  Denies any diarrhea. #Bilateral lower extremity swelling, reports swelling has not improved at all since discharge.  Feels very heavy when she walks. #Reports mild shortness of breath with exertion especially when her legs are heavy She is able to lie flat and night. #Dizziness, improved after using scopolamine patch #Lower extremity DVT, takes Eliquis 5 mg twice daily.  Denies any active bleeding events.   Review of  Systems  Constitutional: Positive for  malaise/fatigue. Negative for chills and fever.  HENT: Negative for nosebleeds and sore throat.   Eyes: Negative for double vision, photophobia and redness.  Respiratory: Positive for shortness of breath. Negative for cough and wheezing.   Cardiovascular: Positive for leg swelling. Negative for chest pain, palpitations and orthopnea.  Gastrointestinal: Positive for nausea. Negative for abdominal pain, blood in stool and vomiting.  Genitourinary: Negative for dysuria.  Musculoskeletal: Negative for back pain, myalgias and neck pain.  Skin: Negative for itching and rash.  Neurological: Positive for dizziness. Negative for tingling and tremors.  Endo/Heme/Allergies: Negative for environmental allergies. Does not bruise/bleed easily.  Psychiatric/Behavioral: Negative for depression.      Allergies  Allergen Reactions  . Adhesive [Tape] Other (See Comments)    Redness:Please use paper tape     Past Medical History:  Diagnosis Date  . Anxiety    takes Xanax prn  . Breast cancer [facility]) August 2013   bilateral, er/pr+  . Complication of anesthesia    pt states she had the shakes extremely bad  . Cough    at night d/t reflux;nothing productive  . Dental crowns present    x 4  . GERD (gastroesophageal reflux disease)    takes Nexium daily  . History of bronchitis    08/2011  . History of colon polyps   . Hot flashes, menopausal 08/08/2012  . IBS (irritable bowel syndrome)    immodium prn  . Insomnia    takes Ambien nightly  . Irritable bowel syndrome   . Neutropenia, drug-induced ([place]) 12/15/2011  . Nocturia   . Plantar fasciitis   . S/P radiation therapy 07/03/2012 through 08/18/2038                                                        Right chest wall/regional lymph nodes 5040 cGy 28 sessions, right chest wall/mastectomy scar boost 1000 cGy 5 sessions                          . Status post chemotherapy    FEC 100 starting on 12/08/2011 - 01/18/12/single agent Taxol x12 weeks  from 02/02/2012 through January 2014   . Urinary frequency   . Use of tamoxifen (Nolvadex)   . Vertigo      Past Surgical History:  Procedure Laterality Date  . BREAST LUMPECTOMY  2008   left  . COLONOSCOPY    . COLONOSCOPY WITH PROPOFOL N/A 10/10/2017   Procedure: COLONOSCOPY WITH PROPOFOL;  Surgeon: Lucilla Lame, MD;  Location: [facility];  Service: Endoscopy;  Laterality: N/A;  . CYSTOSCOPY WITH STENT PLACEMENT Bilateral 12/17/2017   Procedure: CYSTOSCOPY WITH STENT PLACEMENT;  Surgeon: Hollice Espy, MD;  Location: [facility] ORS;  Service: Urology;  Laterality: Bilateral;  . IR NEPHROSTOMY PLACEMENT LEFT  12/19/2017  . IR NEPHROSTOMY PLACEMENT RIGHT  12/19/2017  . MASTECTOMY    . MASTECTOMY WITH AXILLARY LYMPH NODE DISSECTION Right 05/18/2012   Procedure: MASTECTOMY WITH AXILLARY LYMPH NODE DISSECTION;  Surgeon: Adin Hector, MD;  Location: [place];  Service: General;  Laterality: Right;  Bilateral Total Mastectomy , right axillary lymph node dissection left axillary sentinel node biopsy, removal of Porta Cath  . PORT-A-CATH REMOVAL N/A 05/18/2012   Procedure: REMOVAL PORT-A-CATH;  Surgeon:  Adin Hector, MD;  Location: [place];  Service: General;  Laterality: N/A;  . PORTACATH PLACEMENT  12/02/2011   Procedure: INSERTION PORT-A-CATH;  Surgeon: Adin Hector, MD;  Location: [place];  Service: General;  Laterality: N/A;  insertion port a cath with fluoroscopy  . SIMPLE MASTECTOMY WITH AXILLARY SENTINEL NODE BIOPSY Left 05/18/2012   Procedure: SIMPLE MASTECTOMY WITH AXILLARY SENTINEL NODE BIOPSY;  Surgeon: Adin Hector, MD;  Location: [place];  Service: General;  Laterality: Left;    Social History   Socioeconomic History  . Marital status: Married    Spouse name: Not on file  . Number of children: 0  . Years of education: Not on file  . Highest education level: Not on file  Occupational History  . Occupation: Network engineer II    Employer: [employer]  Social  Needs  . Financial resource strain: Not on file  . Food insecurity:    Worry: Not on file    Inability: Not on file  . Transportation needs:    Medical: Not on file    Non-medical: Not on file  Tobacco Use  . Smoking status: Former Smoker    Packs/day: 0.50    Years: 15.00    Pack years: 7.50    Types: Cigarettes    Last attempt to quit: 03/21/1997    Years since quitting: 20.7  . Smokeless tobacco: Never Used  Substance and Sexual Activity  . Alcohol use: Not Currently    Alcohol/week: 0.0 standard drinks    Comment: occasionally wine  . Drug use: No  . Sexual activity: Yes    Birth control/protection: None  Lifestyle  . Physical activity:    Days per week: Not on file    Minutes per session: Not on file  . Stress: Not on file  Relationships  . Social connections:    Talks on phone: Not on file    Gets together: Not on file    Attends religious service: Not on file    Active member of club or organization: Not on file    Attends meetings of clubs or organizations: Not on file    Relationship status: Not on file  . Intimate partner violence:    Fear of current or ex partner: Not on file    Emotionally abused: Not on file    Physically abused: Not on file    Forced sexual activity: Not on file  Other Topics Concern  . Not on file  Social History Narrative   Married to [employer]  No children 77 years old with fmp.  20 year use of birth control pills.      Family History  Problem Relation Age of Onset  . Hypertension Mother   . Diabetes Mother   . Hypertension Maternal Grandmother   . Pancreatic cancer Paternal Grandmother        diagnosed in her 42s  . Ovarian cancer Maternal Aunt        maternal grandmother's sister  . Breast cancer Paternal Aunt        diagnosed in late 34s early 37s  . Parkinsonism Paternal Grandfather      Current Outpatient Medications:  .  amLODipine (NORVASC) 5 MG tablet, TAKE 1 TABLET BY MOUTH DAILY., Disp: 90 tablet, Rfl: 1 .   apixaban (ELIQUIS) 5 MG TABS tablet, Two tablets twice a day for seven days then one tablet twice a day afterwards, Disp: 74 tablet, Rfl: 0 .  Ascorbic  Acid (VITAMIN C) 100 MG tablet, Take 100 mg by mouth daily.  , Disp: , Rfl:  .  Calcium Carbonate-Vitamin D (CALTRATE 600+D PO), Take 1 tablet by mouth daily., Disp: , Rfl:  .  dexamethasone (DECADRON) 4 MG tablet, Take 1 tablet (4 mg total) by mouth See admin instructions. You may take 4 mg daily for 2 days after chemotherapy if needed for nausea, Disp: 30 tablet, Rfl: 0 .  diazepam (VALIUM) 10 MG tablet, Take 1 tablet (10 mg total) by mouth at bedtime as needed for anxiety., Disp: 30 tablet, Rfl: 5 .  esomeprazole (NEXIUM) 40 MG capsule, Take 40 mg by mouth daily before breakfast., Disp: , Rfl:  .  exemestane (AROMASIN) 25 MG tablet, TAKE 1 TABLET BY MOUTH ONCE DAILY AFTER BREAKFAST, Disp: 90 tablet, Rfl: 3 .  goserelin (ZOLADEX) 3.6 MG injection, Inject 3.6 mg into the skin every 28 (twenty-eight) days., Disp: , Rfl:  .  lidocaine-prilocaine (EMLA) cream, Apply to affected area once, Disp: 30 g, Rfl: 3 .  loperamide (IMODIUM A-D) 2 MG tablet, Take 2 mg by mouth 4 (four) times daily as needed for diarrhea or loose stools. , Disp: , Rfl:  .  Multiple Vitamin (MULTIVITAMIN) tablet, Take 1 tablet by mouth daily.  , Disp: , Rfl:  .  ondansetron (ZOFRAN) 4 MG tablet, Take 1 tablet (4 mg total) by mouth every 8 (eight) hours as needed for nausea or vomiting., Disp: 20 tablet, Rfl: 0 .  opium-belladonna (B&O SUPPRETTES) 16.2-60 MG suppository, Place 1 suppository rectally every 8 (eight) hours as needed for bladder spasms., Disp: 10 suppository, Rfl: 0 .  oxybutynin (DITROPAN) 5 MG tablet, Take 1 tablet (5 mg total) by mouth every 8 (eight) hours as needed for bladder spasms., Disp: 20 tablet, Rfl: 0 .  prochlorperazine (COMPAZINE) 10 MG tablet, Take 1 tablet (10 mg total) by mouth every 6 (six) hours as needed (Nausea or vomiting)., Disp: 30 tablet, Rfl:  1 .  scopolamine (TRANSDERM-SCOP) 1 MG/3DAYS, Place 1 patch (1.5 mg total) onto the skin every 3 (three) days., Disp: 10 patch, Rfl: 0 .  traZODone (DESYREL) 50 MG tablet, Take 0.5-1 tablets (25-50 mg total) by mouth at bedtime as needed for sleep., Disp: 90 tablet, Rfl: 1 .  furosemide (LASIX) 20 MG tablet, Take 1 tablet (20 mg total) by mouth daily as needed., Disp: 10 tablet, Rfl: 0 .  potassium chloride SA (K-DUR,KLOR-CON) 20 MEQ tablet, Take 1 tablet (20 mEq total) by mouth daily., Disp: 30 tablet, Rfl: 0 .  traMADol (ULTRAM) 50 MG tablet, Take 50 mg by mouth every 6 (six) hours as needed., Disp: , Rfl: 0  Physical exam: ECOG 2 Vitals:   12/25/17 1610  BP: 117/70  Pulse: 90  Resp: 16  Temp: 98.2 F (36.8 C)  TempSrc: Tympanic  Weight: 178 lb 3 oz (80.8 kg)   Physical Exam  Constitutional: She is oriented to person, place, and time. No distress.  HENT:  Head: Normocephalic and atraumatic.  Mouth/Throat: Oropharynx is clear and moist.  Eyes: Pupils are equal, round, and reactive to light. EOM are normal. No scleral icterus.  Neck: Normal range of motion. Neck supple.  Cardiovascular: Normal rate, regular rhythm and normal heart sounds.  Pulmonary/Chest: Effort normal. No respiratory distress. She has no wheezes.  Decreased breath sounds at right lower base  Abdominal: Soft. Bowel sounds are normal. She exhibits no distension and no mass. There is no tenderness.  Musculoskeletal: Normal range of motion. She  exhibits edema. She exhibits no deformity.  Bilateral 3+ pitting edema  Neurological: She is alert and oriented to person, place, and time. No cranial nerve deficit. Coordination normal.  Skin: Skin is warm and dry. No rash noted. No erythema.  Psychiatric: She has a normal mood and affect. Her behavior is normal. Thought content normal.    CMP Latest Ref Rng & Units 12/25/2017  Glucose 70 - 99 mg/dL 115(H)  BUN 6 - 20 mg/dL 16  Creatinine 0.44 - 1.00 mg/dL 1.09(H)  Sodium  135 - 145 mmol/L 141  Potassium 3.5 - 5.1 mmol/L 3.3(L)  Chloride 98 - 111 mmol/L 112(H)  CO2 22 - 32 mmol/L 21(L)  Calcium 8.9 - 10.3 mg/dL 7.3(L)  Total Protein 6.5 - 8.1 g/dL 6.5  Total Bilirubin 0.3 - 1.2 mg/dL 0.5  Alkaline Phos 38 - 126 U/L 159(H)  AST 15 - 41 U/L 202(H)  ALT 0 - 44 U/L 192(H)   CBC Latest Ref Rng & Units 12/25/2017  WBC 3.6 - 11.0 K/uL 7.7  Hemoglobin 12.0 - 16.0 g/dL 10.7(L)  Hematocrit 35.0 - 47.0 % 30.3(L)  Platelets 150 - 440 K/uL 355    Dg Chest 2 View  Result Date: 12/20/2017 CLINICAL DATA:  Shortness of breath. Weakness. EXAM: CHEST - 2 VIEW COMPARISON:  12/02/2011 FINDINGS: The cardiac silhouette is normal in size. Left chest Port-A-Cath has been removed. Surgical clips are present in the axillae. There are new moderate right and small left pleural effusions with atelectasis or consolidation in both lung bases, particularly medially. No pneumothorax is identified. No acute osseous abnormality is seen. IMPRESSION: New moderate right and small left pleural effusions with bibasilar atelectasis or consolidation. Electronically Signed   By: Logan Bores M.D.   On: 12/20/2017 13:43   US Renal  Result Date: 12/18/2017 CLINICAL DATA:  Hydronephrosis aunt LOWER abdominal pain for 2-3 months. History of cystoscopy with bilateral stent placement 12/17/2017. EXAM: RENAL / URINARY TRACT ULTRASOUND COMPLETE COMPARISON:  CT of the abdomen and pelvis on 12/16/2017 FINDINGS: Right Kidney: Length: 12.1 centimeters. There is moderate hydronephrosis. Stent is visualized within the RIGHT renal pelvis. No solid or cystic renal mass. Left Kidney: Length: 12.7 centimeter. There is moderate hydronephrosis. The ureteral stent is not well seen in the LEFT renal pelvis. Bladder: Bladder is decompressed by a Foley catheter. Additional: There are bilateral pleural effusions.  Ascites. IMPRESSION: 1. Moderate bilateral hydronephrosis. 2. Foley catheter decompresses the bladder. 3. Bilateral  pleural effusions.  Ascites. Electronically Signed   By: Nolon Nations M.D.   On: 12/18/2017 16:36   US Venous Img Lower Unilateral Left  Result Date: 12/16/2017 CLINICAL DATA:  Lower extremity edema EXAM: LEFT LOWER EXTREMITY VENOUS DUPLEX ULTRASOUND TECHNIQUE: Doppler venous assessment of the left lower extremity deep venous system was performed, including characterization of spectral flow, compressibility, and phasicity. COMPARISON:  None. FINDINGS: There is complete compressibility of the left common femoral and femoral veins. The left popliteal vein is partially occlusive and contains partially occlusive DVT. Thrombus is mixed echogenicity with areas of hypoechoic and hyperechoic signal. There is occlusive thrombus in the posterior tibial and peroneal veins. Doppler analysis demonstrates respiratory phasicity and augmentation. IMPRESSION: The study is positive for DVT in the popliteal, posterior tibial, and peroneal veins as described. Critical Value/emergent results were called by telephone at the time of interpretation on 12/16/2017 at 9:24 am to Dr. Alfred Levins, who verbally acknowledged these results. Electronically Signed   By: Marybelle Killings M.D.   On: 12/16/2017  09:25   Ct Renal Stone Study  Result Date: 12/16/2017 CLINICAL DATA:  57 year old female with abdominal pain. History of breast cancer and bilateral mastectomy. Patient is on chemo and radiation treatment. EXAM: CT ABDOMEN AND PELVIS WITHOUT CONTRAST TECHNIQUE: Multidetector CT imaging of the abdomen and pelvis was performed following the standard protocol without IV contrast. COMPARISON:  PET-CT dated 12/01/2011 FINDINGS: Evaluation of this exam is limited in the absence of intravenous contrast. Lower chest: Partially visualized small to moderate bilateral pleural effusions with associated partial compressive atelectasis of the adjacent lungs and areas of linear atelectasis/scarring. No intra-abdominal free air. Diffuse mesenteric edema and  stranding and small ascites. Hepatobiliary: Subcentimeter hypodense focus in the right lobe of the liver is not characterized. The liver is otherwise unremarkable on this noncontrast CT. Probable tiny stone or sludge within the gallbladder. Pancreas: Unremarkable. No pancreatic ductal dilatation or surrounding inflammatory changes. Spleen: Normal in size without focal abnormality. Adrenals/Urinary Tract: Mild bilateral adrenal thickening. There is moderate bilateral hydronephrosis. The ureters are nondistended. The hydronephrosis is likely related to a degree of stricture at the ureteropelvic junctions secondary to retroperitoneal adhesions/fibrosis. No stone identified within the kidneys or along the course of the ureters. There is masslike thickening of the anterior and left lateral bladder wall likely metastatic disease. Stomach/Bowel: There is diffuse thickening of the stomach which may be partly related to underdistention and ascites. Gastritis or an infiltrative process is not entirely excluded. There is no bowel obstruction. The appendix is normal as visualized. Vascular/Lymphatic: Mild aortoiliac atherosclerotic disease. The abdominal aorta and IVC are grossly unremarkable on this noncontrast CT. No portal venous gas. There are multiple somewhat small retroperitoneal and para-aortic lymph nodes. A mildly enlarged right iliac chain node measures 11 mm in short axis. There is diffuse mesenteric and omental nodularity and edema consistent with metastatic implants. Multiple top-normal and mildly rounded mesenteric lymph nodes measure up to 8 mm in short axis. Reproductive: Evaluation of the pelvic structures is very limited. The uterus appears retroflexed. A 3.3 x 3.9 x 2.6 cm ill-defined high attenuating solid lesion to the right of the uterine fundus is not well evaluated and may represent a fibroid or a complex lesion arising from the right adnexa. However, a neoplastic mass is not excluded. Pelvic ultrasound  may provide better evaluation. Other: There is mild diffuse subcutaneous edema of the abdominal wall. Musculoskeletal: Multiple small scattered sclerotic lesions throughout the spine most consistent with metastatic disease. No acute fracture. IMPRESSION: 1. Moderate bilateral hydronephrosis, likely related to strictures at the level of the UPJ's, and likely secondary to retroperitoneal adhesions or fibrosis. No obstructing stone. 2. Masslike thickening of the anterior and left lateral bladder wall suspicious for metastatic implant. 3. Mesenteric, omental, and peritoneal implants with a small ascites and diffuse mesenteric edema. 4. Small to moderate bilateral pleural effusions with associated compressive atelectasis of the lungs. 5. Diffuse small osseous sclerotic metastatic disease. Electronically Signed   By: Anner Crete M.D.   On: 12/16/2017 06:53   Ir Nephrostomy Placement Left  Result Date: 12/20/2017 INDICATION: Bilateral ureteral obstruction with occluded ureteral stents EXAM: IR NEPHROSTOMY PLACEMENT LEFT COMPARISON:  None. MEDICATIONS: 2 g IV Ancef; The antibiotic was administered in an appropriate time frame prior to skin puncture. ANESTHESIA/SEDATION: Fentanyl 75 mcg IV; Versed 1 mg IV Moderate Sedation Time:  35 The patient was continuously monitored during the procedure by the interventional radiology nurse under my direct supervision. CONTRAST:  15mL ISOVUE-300 IOPAMIDOL (ISOVUE-300) INJECTION 61% - administered into  the collecting system(s) FLUOROSCOPY TIME:  Fluoroscopy Time: 5 minutes 48 seconds (356 mGy). COMPLICATIONS: None immediate. PROCEDURE: Informed written consent was obtained from the patient after a thorough discussion of the procedural risks, benefits and alternatives. All questions were addressed. Maximal Sterile Barrier Technique was utilized including caps, mask, sterile gowns, sterile gloves, sterile drape, hand hygiene and skin antiseptic. A timeout was performed prior to  the initiation of the procedure. Utilizing 1% lidocaine as a local and deep anesthetic and fluoroscopic guidance, a 21 gauge needle was utilized to puncture the collecting system in the region of the left renal pelvis guided by a previously placed stent. Brown colored urine likely related to prior hemorrhage was withdrawn from the needle and contrast injection confirmed location within the collecting system. Subsequently a small amount of room air was injected to allow for delineation of posterior caliceal system. A lower pole posterior calyx was identified and again utilizing 1% xylocaine as local anesthetic and fluoroscopic guidance, a 20 gauge diamond tip needle was passed into the posterior calyx. Both air and urine were withdrawn from the needle and subsequently a 0.018 platinum tip guidewire was advanced into the collecting system. Over this, a 6 [place] Accustick dilator was placed into the collecting system and a J wire coiled within the renal pelvis. The axis sick dilator was then removed and the tract dilated to 8 [place]. The dilator was then removed and a 8 French pigtail catheter was placed over the wire and coiled within the renal pelvis. The catheter was initially placed adjacent to the body of the stent although no significant drainage of urine was noted. Contrast material was injected which demonstrates a significant amount of thrombus within the collecting system and for this reason the catheter was advanced into the superior aspect of the renal pelvis where adequate urine drainage was noted. The catheter was then sutured into place utilizing 0 Prolene and a [place] buttress. Catheter was placed external gravity drain and bloody urine was obtained. The patient tolerated the catheter placement well and attention was then turned to the right collecting system. IMPRESSION: Successful placement of an 8 French pigtail catheter within the left renal collecting system. Thrombus is noted within the  collecting system which may limit drainage somewhat. This should resolve over time. Electronically Signed   By: Inez Catalina M.D.   On: 12/20/2017 08:02   Ir Nephrostomy Placement Right  Result Date: 12/20/2017 INDICATION: Ureteral occlusion with occluded ureteral stents EXAM: IR NEPHROSTOMY PLACEMENT RIGHT COMPARISON:  None. MEDICATIONS: 2 g IV Ancef; The antibiotic was administered in an appropriate time frame prior to skin puncture. ANESTHESIA/SEDATION: Fentanyl 75 mcg IV; Versed 1 mg IV Moderate Sedation Time:  35 The patient was continuously monitored during the procedure by the interventional radiology nurse under my direct supervision. CONTRAST:  46mL ISOVUE-300 IOPAMIDOL (ISOVUE-300) INJECTION 61% - administered into the collecting system(s) FLUOROSCOPY TIME:  Fluoroscopy Time: 5 minutes 48 seconds (356 mGy). COMPLICATIONS: None immediate. PROCEDURE: Informed written consent was obtained from the patient after a thorough discussion of the procedural risks, benefits and alternatives. All questions were addressed. Maximal Sterile Barrier Technique was utilized including caps, mask, sterile gowns, sterile gloves, sterile drape, hand hygiene and skin antiseptic. A timeout was performed prior to the initiation of the procedure. Utilizing 1% xylocaine as local anesthetic and fluoroscopic guidance a 21 gauge Chiba needle was placed into the right collecting system utilizing the pigtail from the previously placed ureteral stent as a guide. Brown tinged urine was withdrawn  from the needle and subsequently a small amount of contrast was instilled to confirm needle placement. This demonstrates dilated collecting system. Subsequently a small amount of room air was instilled to allow for delineation of a posterior calyx. Again utilizing 1% xylocaine as local anesthetic and fluoroscopic guidance, a 20 gauge diamond tip needle was placed into the upper pole posterior calyx without difficulty. A 0.018 platinum tip  guidewire was then advanced into the collecting system and coiled within the renal pelvis. A 6 French Accustick dilator was then placed over the microwire into the collecting system. The central dilator in stiff near were then removed and a J tipped guidewire was coiled within the renal pelvis. Dilatation to 10 [place] was then performed and a 10 French nephrostomy catheter placed over the guidewire into the renal pelvis. Brisk return of bloody urine was then noted. The catheter was then attached to an external gravity bag. The catheter was sutured into place utilizing 0 Prolene and a Hopkins buttress. Patient tolerated the procedure well and was returned to her room in satisfactory condition. IMPRESSION: Successful right percutaneous nephrostomy placement as described above. Bloody urine was noted at the termination of the procedure which should clear over time. Electronically Signed   By: Inez Catalina M.D.   On: 12/20/2017 08:06     Assessment and plan Patient is a 57 y.o. female with metastatic breast cancer, visceral crisis present for follow-up. 1. Metastatic breast cancer ([place])   2. Generalized edema   3. Dizziness   4. Acute deep vein thrombosis (DVT) of proximal vein of left lower extremity (HCC)   5. Nephrostomy status ([place])   6. AKI (acute kidney injury) ([place])   7. Hypercalcemia   8. Anemia, unspecified type    #Metastatic breast cancer, she tolerates chemotherapy well with manageable nausea.  Continue home antiemetics as instructed. She she has had chemo education class today. Also is scheduled for Mediport placement tomorrow.  Patient previously follows up at [facility].  She prefers to follow-up with [facility] as it is closer to her home.  #Edema, most likely secondary to volume overload/CKD due to aggressive hydration for the past 1 week during her recent admission. I will also obtain a 2D echo to further assess cardiac function given her previous  anthracycline exposure. She asked if anything that could help with the swelling.  I sent her a prescription of Lasix 20 mg daily or every other day as needed for edema.  She may try 1 dose tonight.  #Chronic dizziness, questionable vertigo versus CNS metastatic breast cancer involvement.  Repeat MRI brain with and without contrast.  If negative consider lumbar puncture  #Lower extremity DVT in metastatic cancer setting.  Continue with Eliquis 5 mg twice daily.  Duration of anticoagulation, long-term. #AKI due to post renal obstruction, status post bilateral nephrostomy tube placement.  Creatinine further decreased.  Close to her baseline.  Follow-up with Dr. Erlene Quan outpatient for retrieval of ureter stent.  #Hypercalcemia, received calcitonin and Zometa during recent admission.  Calcium level stable. #Transaminitis, likely secondary to recent chemotherapy.  Continue to monitor. #Anemia, secondary to recent blood loss and chemotherapy.  Continue to monitor.    Orders Placed This Encounter  Procedures  . MR Brain W Wo Contrast    Standing Status:   Future    Standing Expiration Date:   12/25/2018    Order Specific Question:   ** REASON FOR EXAM (FREE TEXT)    Answer:   dizziness, metastatic  breast cancer.    Order Specific Question:   If indicated for the ordered procedure, I authorize the administration of contrast media per Radiology protocol    Answer:   Yes    Order Specific Question:   What is the patient's sedation requirement?    Answer:   No Sedation    Order Specific Question:   Does the patient have a pacemaker or implanted devices?    Answer:   No    Order Specific Question:   Use SRS Protocol?    Answer:   No    Order Specific Question:   Radiology Contrast Protocol - do NOT remove file path    Answer:   [email or web address]    Order Specific Question:   Preferred imaging location?    Answer:   [facility] (table limit-350lbs)  . ECHOCARDIOGRAM  COMPLETE    Standing Status:   Future    Standing Expiration Date:   03/28/2019    Order Specific Question:   Where should this test be performed    Answer:   [facility]    Order Specific Question:   Perflutren DEFINITY (image enhancing agent) should be administered unless hypersensitivity or allergy exist    Answer:   Administer Perflutren     Total face to face encounter time for this patient visit was 11min. >50% of the time was  spent in counseling and coordination of care.    Earlie Server, MD, PhD Hematology Oncology [facility] Pager- [phone] 12/25/2017

## 2017-12-26 ENCOUNTER — Ambulatory Visit
Admission: RE | Admit: 2017-12-26 | Discharge: 2017-12-26 | Disposition: A | Discharge status: Home / Self Care | Payer: 59 | Source: Ambulatory Visit | Attending: Vascular Surgery | Admitting: Vascular Surgery

## 2017-12-26 ENCOUNTER — Encounter: Payer: Self-pay | Admitting: Urology

## 2017-12-26 ENCOUNTER — Ambulatory Visit (INDEPENDENT_AMBULATORY_CARE_PROVIDER_SITE_OTHER): Payer: 59 | Admitting: Urology

## 2017-12-26 ENCOUNTER — Encounter
Admission: RE | Disposition: A | Discharge status: Home / Self Care | Payer: Self-pay | Source: Ambulatory Visit | Attending: Vascular Surgery

## 2017-12-26 ENCOUNTER — Ambulatory Visit
Admission: RE | Admit: 2017-12-26 | Discharge: 2017-12-26 | Disposition: A | Discharge status: Home / Self Care | Payer: 59 | Source: Ambulatory Visit | Attending: Oncology | Admitting: Oncology

## 2017-12-26 VITALS — BP 136/81 | HR 97 | Ht 64.0 in | Wt 178.0 lb

## 2017-12-26 DIAGNOSIS — K589 Irritable bowel syndrome without diarrhea: Secondary | ICD-10-CM | POA: Diagnosis not present

## 2017-12-26 DIAGNOSIS — C50919 Malignant neoplasm of unspecified site of unspecified female breast: Secondary | ICD-10-CM

## 2017-12-26 DIAGNOSIS — F419 Anxiety disorder, unspecified: Secondary | ICD-10-CM

## 2017-12-26 DIAGNOSIS — Z960 Presence of urogenital implants: Secondary | ICD-10-CM | POA: Insufficient documentation

## 2017-12-26 DIAGNOSIS — R601 Generalized edema: Secondary | ICD-10-CM | POA: Diagnosis not present

## 2017-12-26 DIAGNOSIS — N1339 Other hydronephrosis: Secondary | ICD-10-CM

## 2017-12-26 DIAGNOSIS — D649 Anemia, unspecified: Secondary | ICD-10-CM | POA: Insufficient documentation

## 2017-12-26 DIAGNOSIS — Z923 Personal history of irradiation: Secondary | ICD-10-CM

## 2017-12-26 DIAGNOSIS — K219 Gastro-esophageal reflux disease without esophagitis: Secondary | ICD-10-CM | POA: Insufficient documentation

## 2017-12-26 DIAGNOSIS — Z87891 Personal history of nicotine dependence: Secondary | ICD-10-CM | POA: Diagnosis not present

## 2017-12-26 DIAGNOSIS — Z8249 Family history of ischemic heart disease and other diseases of the circulatory system: Secondary | ICD-10-CM | POA: Insufficient documentation

## 2017-12-26 DIAGNOSIS — I824Y2 Acute embolism and thrombosis of unspecified deep veins of left proximal lower extremity: Secondary | ICD-10-CM | POA: Insufficient documentation

## 2017-12-26 DIAGNOSIS — Z7901 Long term (current) use of anticoagulants: Secondary | ICD-10-CM | POA: Insufficient documentation

## 2017-12-26 DIAGNOSIS — Z86010 Personal history of colonic polyps: Secondary | ICD-10-CM | POA: Insufficient documentation

## 2017-12-26 DIAGNOSIS — Z9889 Other specified postprocedural states: Secondary | ICD-10-CM | POA: Insufficient documentation

## 2017-12-26 DIAGNOSIS — Z9013 Acquired absence of bilateral breasts and nipples: Secondary | ICD-10-CM | POA: Diagnosis not present

## 2017-12-26 DIAGNOSIS — Z888 Allergy status to other drugs, medicaments and biological substances status: Secondary | ICD-10-CM | POA: Insufficient documentation

## 2017-12-26 DIAGNOSIS — N179 Acute kidney failure, unspecified: Secondary | ICD-10-CM | POA: Insufficient documentation

## 2017-12-26 DIAGNOSIS — Z833 Family history of diabetes mellitus: Secondary | ICD-10-CM | POA: Diagnosis not present

## 2017-12-26 DIAGNOSIS — Z79899 Other long term (current) drug therapy: Secondary | ICD-10-CM | POA: Insufficient documentation

## 2017-12-26 DIAGNOSIS — G4700 Insomnia, unspecified: Secondary | ICD-10-CM | POA: Diagnosis not present

## 2017-12-26 HISTORY — PX: PORTA CATH INSERTION: CATH118285

## 2017-12-26 SURGERY — PORTA CATH INSERTION
Anesthesia: Moderate Sedation

## 2017-12-26 MED ORDER — MIDAZOLAM HCL 5 MG/5ML IJ SOLN
INTRAMUSCULAR | Status: AC
  Filled 2017-12-26: qty 5

## 2017-12-26 MED ORDER — ONDANSETRON HCL 4 MG/2ML IJ SOLN: 4.0000 mg | Freq: Four times a day (QID) | INTRAMUSCULAR | Status: DC | PRN

## 2017-12-26 MED ORDER — FENTANYL CITRATE (PF) 100 MCG/2ML IJ SOLN
INTRAMUSCULAR | Status: AC
  Filled 2017-12-26: qty 2

## 2017-12-26 MED ORDER — CEFAZOLIN SODIUM-DEXTROSE 1-4 GM/50ML-% IV SOLN
1.0000 g | Freq: Once | INTRAVENOUS | Status: AC
  Administered 2017-12-26: 1 g via INTRAVENOUS

## 2017-12-26 MED ORDER — MIDAZOLAM HCL 2 MG/2ML IJ SOLN
INTRAMUSCULAR | Status: DC | PRN
  Administered 2017-12-26: 0.5 mg via INTRAVENOUS
  Administered 2017-12-26: 1 mg via INTRAVENOUS
  Administered 2017-12-26: 2 mg via INTRAVENOUS

## 2017-12-26 MED ORDER — LIDOCAINE-EPINEPHRINE (PF) 1 %-1:200000 IJ SOLN
INTRAMUSCULAR | Status: AC
  Filled 2017-12-26: qty 30

## 2017-12-26 MED ORDER — SODIUM CHLORIDE 0.9 % IV SOLN
INTRAVENOUS | Status: DC
  Administered 2017-12-26: 13:00:00 via INTRAVENOUS

## 2017-12-26 MED ORDER — HEPARIN (PORCINE) IN NACL 1000-0.9 UT/500ML-% IV SOLN
INTRAVENOUS | Status: AC
  Filled 2017-12-26: qty 500

## 2017-12-26 MED ORDER — HYDROMORPHONE HCL 1 MG/ML IJ SOLN: 1.0000 mg | Freq: Once | INTRAMUSCULAR | Status: DC | PRN

## 2017-12-26 MED ORDER — LIDOCAINE HCL URETHRAL/MUCOSAL 2 % EX GEL
1.0000 "application " | Freq: Once | CUTANEOUS | Status: AC
  Administered 2017-12-26: 1 via URETHRAL

## 2017-12-26 MED ORDER — CIPROFLOXACIN HCL 500 MG PO TABS
500.0000 mg | ORAL_TABLET | Freq: Once | ORAL | Status: AC
  Administered 2017-12-26: 500 mg via ORAL

## 2017-12-26 MED ORDER — FENTANYL CITRATE (PF) 100 MCG/2ML IJ SOLN
INTRAMUSCULAR | Status: DC | PRN
  Administered 2017-12-26: 25 ug via INTRAVENOUS
  Administered 2017-12-26: 50 ug via INTRAVENOUS
  Administered 2017-12-26: 25 ug via INTRAVENOUS

## 2017-12-26 SURGICAL SUPPLY — 14 items
DERMABOND ADVANCED (GAUZE/BANDAGES/DRESSINGS) ×1
DERMABOND ADVANCED .7 DNX12 (GAUZE/BANDAGES/DRESSINGS) ×1 IMPLANT
DRAPE INCISE IOBAN 66X45 STRL (DRAPES) ×2 IMPLANT
ELECT REM PT RETURN 9FT ADLT (ELECTROSURGICAL) ×2
ELECTRODE REM PT RTRN 9FT ADLT (ELECTROSURGICAL) ×1 IMPLANT
KIT PORT POWER 8FR ISP CVUE (Port) ×2 IMPLANT
NEEDLE ENTRY 21GA 7CM ECHOTIP (NEEDLE) ×2 IMPLANT
PACK ANGIOGRAPHY (CUSTOM PROCEDURE TRAY) ×2 IMPLANT
PENCIL ELECTRO HAND CTR (MISCELLANEOUS) ×2 IMPLANT
SET INTRO CAPELLA COAXIAL (SET/KITS/TRAYS/PACK) ×2 IMPLANT
SUT MNCRL AB 4-0 PS2 18 (SUTURE) ×2 IMPLANT
SUT PROLENE 0 CT 1 30 (SUTURE) ×2 IMPLANT
SUT VIC AB 3-0 SH 27 (SUTURE) ×1
SUT VIC AB 3-0 SH 27X BRD (SUTURE) ×1 IMPLANT

## 2017-12-26 NOTE — H&P (Signed)
[facility] History & Physical Update  The patient was interviewed and re-examined.  The patient's previous History and Physical has been reviewed and is unchanged.    Patient now has widely metastatic breast carcinoma and will require chemotherapy.  She is therefore undergoing Infuse-a-Port placement so that she can receive her appropriate care.  There is no change in the plan of care. We plan to proceed with the scheduled procedure.  Hortencia Pilar, MD  12/26/2017, 2:17 PM

## 2017-12-26 NOTE — Progress Notes (Signed)
*  PRELIMINARY RESULTS* Echocardiogram 2D Echocardiogram has been performed.  Sherrie Sport 12/26/2017, 10:32 AM

## 2017-12-26 NOTE — Op Note (Signed)
OPERATIVE NOTE   PROCEDURE: 1. Placement of a right IJ Infuse-a-Port  PRE-OPERATIVE DIAGNOSIS: Metastatic breast carcinoma  POST-OPERATIVE DIAGNOSIS: Same  SURGEON: Katha Cabal M.D.  ANESTHESIA: Conscious sedation was administered under my direct supervision by the interventional radiology RN. IV Versed plus fentanyl were utilized. Continuous ECG, pulse oximetry and blood pressure was monitored throughout the entire procedure. Conscious sedation was for a total of 31 minutes.  ESTIMATED BLOOD LOSS: Minimal   FINDING(S): 1.  Patent vein  SPECIMEN(S): None  INDICATIONS:   Mary [name] is a 57 y.o. female who presents with metastatic breast carcinoma.  She will require chemotherapy and therefore appropriate IV access.  Infuse-a-Port is being recommended.  Risks and benefits are reviewed patient agrees to proceed.  DESCRIPTION: After obtaining full informed written consent, the patient was brought back to the special procedure suite and placed in the supine position. The patient's right neck and chest wall are prepped and draped in sterile fashion. Appropriate timeout was called.  Ultrasound is placed in a sterile sleeve, ultrasound is utilized to avoid vascular injury as well as secondary to lack of appropriate landmarks. The right internal jugular vein is identified. It is echolucent and homogeneous as well as easily compressible indicating patency. An image is recorded for the permanent record.  Access to the vein with a micropuncture needle is done under direct ultrasound visualization.  1% lidocaine is infiltrated into the soft tissue at the base of the neck as well as on the chest wall.  Under direct ultrasound visualization a micro-needle is inserted into the vein followed by the micro-wire. Micro-sheath was then advanced and a J wire is inserted without difficulty under fluoroscopic guidance. A small counterincision was created at the wire insertion site. A transverse  incision is created 2 fingerbreadths below the scapula and a pocket is fashioned using both blunt and sharp dissection. The pocket is tested for appropriate size with the hub of the Infuse-a-Port. The tunneling device is then used to pull the intravascular portion of the catheter from the pocket to the neck counterincision.  Dilator and peel-away sheath were then inserted over the wire and the wire is removed. Catheter is then advanced into the venous system without difficulty. Peel-away sheath was then removed.  Catheter is then positioned under fluoroscopic guidance at the atrial caval junction. It is then transected connected to the hub and the hope is slipped into the subcutaneous pocket on the chest wall. The hub was then accessed percutaneously and aspirates easily and flushes well and is flushed with 30 cc of heparinized saline. The pocket incision is then closed in layers using interrupted 3-0 Vicryl for the subcutaneous tissues and 4-0 Monocryl subcuticular for skin closure. Dermabond is applied. The neck counterincision was closed with 4-0 Monocryl subcuticular and Dermabond as well.  The patient tolerated the procedure well and there were no immediate complications.  COMPLICATIONS: None  CONDITION: Unchanged  Katha Cabal M.D. [facility] Office: [phone]   12/26/2017, 5:03 PM

## 2017-12-26 NOTE — Progress Notes (Signed)
   12/26/17  CC:  Chief Complaint  Patient presents with  . Cysto Stent Removal    HPI: 57 year old female with personal history of breast cancer recently admitted with widely metastatic disease including metastasis to the bladder with bilateral ureteral obstruction.  During her admission, ureteral stents were placed but she developed worsening renal function and ultimately underwent placement of percutaneous nephrostomy tubes.  She did receive her first dose of chemotherapy while inpatient.  She is getting a port today.  She presents today for cystoscopy, stent removal.  Since her nephrostomy tubes are placed, she is voiding very little from below.  Blood pressure 136/81, pulse 97, height 5\' 4"  (1.626 m), weight 178 lb (80.7 kg), last menstrual period 11/28/2011. NED. A&Ox3.   No respiratory distress   Abd soft, NT, ND Normal external genitalia with patent urethral meatus  Cystoscopy/ Stent removal procedure  Patient identification was confirmed, informed consent was obtained, and patient was prepped using Betadine solution.  Lidocaine jelly was administered per urethral meatus.    Preoperative abx where received prior to procedure.    Procedure: - Flexible cystoscope introduced, without any difficulty.   - Thorough search of the bladder revealed:    normal urethral meatus  Stent seen emanating from left ureteral orifice, grasped with stent graspers, and removed in entirety.    The scope was then reinserted and the right ureteral stent was removed using the same technique as described above.  Moderate debris in bladder limiting visualization.  Post-Procedure: - Patient tolerated the procedure well   Assessment/ Plan:  1. Other hydronephrosis Failed bilateral ureteral stents Now with bilateral percutaneous nephrostomy tubes in place Bilateral ureteral stents removed today in the office no difficulty We discussed the risk of infection today, advised to call or go to the  emergency room if she develops a fever Periprocedural antibiotics given today We will go ahead and arrange for her next nephrostomy tube exchange in 6 weeks We did review instructions for nephrostomy tube management today including covering with water occlusive dressing for showering and flushing as needed All questions were answered - lidocaine (XYLOCAINE) 2 % jelly 1 application - ciprofloxacin (CIPRO) tablet 500 mg  2. Metastatic breast cancer [facility]) Metastatic breast cancer including involvement of the bladder and bilateral ureteral obstruction Management as above    Return in about 2 months (around 02/25/2018) for discussed nephrostomy tube exchange.  Hollice Espy, MD

## 2017-12-27 ENCOUNTER — Encounter: Payer: Self-pay | Admitting: Vascular Surgery

## 2017-12-27 ENCOUNTER — Other Ambulatory Visit: Payer: Self-pay

## 2017-12-27 NOTE — Patient Outreach (Signed)
[facility]) Care Management  12/27/2017  Mary [name] 09-Apr-1960 [id number]   Telephone call for transition of care call.  Member was hospitalized 12/16/17-12/22/17 for hypercalcemia, lt lower leg DVT, and metastatic breast cancer. 12/26/17 portacath insertion Member states that she is to go Friday for her 2nd chemotherapy and see her doctor.  States her husband is very helpful and assists her as needed.  States she does not have any pain but some occasional nausea.  States she has all of her medications.  States she has filed for her [employer] and short term disability.  States she has the hospital indemnity but she has not called them yet.   Transition of care call completed Member given contact numbers for hospital indemnity and benefits line Reviewed s/s to call doctor Plan to send successful outreach letter  Plan to close case as member assessed with no further outreach needed. Peter Garter RN, Jackquline Denmark, CDE Care Management Coordinator [facility] Care Management [phone]

## 2017-12-29 ENCOUNTER — Inpatient Hospital Stay: Payer: 59

## 2017-12-29 ENCOUNTER — Other Ambulatory Visit: Payer: Self-pay | Admitting: Radiology

## 2017-12-29 ENCOUNTER — Encounter: Payer: Self-pay | Admitting: Oncology

## 2017-12-29 ENCOUNTER — Inpatient Hospital Stay (HOSPITAL_BASED_OUTPATIENT_CLINIC_OR_DEPARTMENT_OTHER): Payer: 59 | Admitting: Oncology

## 2017-12-29 ENCOUNTER — Ambulatory Visit: Payer: 59

## 2017-12-29 ENCOUNTER — Other Ambulatory Visit: Payer: Self-pay

## 2017-12-29 VITALS — BP 136/88 | HR 99

## 2017-12-29 VITALS — BP 117/75 | HR 93 | Temp 96.1°F | Resp 16 | Wt 172.0 lb

## 2017-12-29 DIAGNOSIS — I824Y2 Acute embolism and thrombosis of unspecified deep veins of left proximal lower extremity: Secondary | ICD-10-CM

## 2017-12-29 DIAGNOSIS — R740 Nonspecific elevation of levels of transaminase and lactic acid dehydrogenase [LDH]: Secondary | ICD-10-CM | POA: Diagnosis not present

## 2017-12-29 DIAGNOSIS — R42 Dizziness and giddiness: Secondary | ICD-10-CM | POA: Diagnosis not present

## 2017-12-29 DIAGNOSIS — C50919 Malignant neoplasm of unspecified site of unspecified female breast: Secondary | ICD-10-CM

## 2017-12-29 DIAGNOSIS — I82459 Acute embolism and thrombosis of unspecified peroneal vein: Secondary | ICD-10-CM | POA: Diagnosis not present

## 2017-12-29 DIAGNOSIS — Z7901 Long term (current) use of anticoagulants: Secondary | ICD-10-CM

## 2017-12-29 DIAGNOSIS — Z9221 Personal history of antineoplastic chemotherapy: Secondary | ICD-10-CM

## 2017-12-29 DIAGNOSIS — E8809 Other disorders of plasma-protein metabolism, not elsewhere classified: Secondary | ICD-10-CM | POA: Diagnosis not present

## 2017-12-29 DIAGNOSIS — N1339 Other hydronephrosis: Secondary | ICD-10-CM

## 2017-12-29 DIAGNOSIS — C50411 Malignant neoplasm of upper-outer quadrant of right female breast: Secondary | ICD-10-CM

## 2017-12-29 DIAGNOSIS — Z936 Other artificial openings of urinary tract status: Secondary | ICD-10-CM

## 2017-12-29 DIAGNOSIS — D6489 Other specified anemias: Secondary | ICD-10-CM | POA: Diagnosis not present

## 2017-12-29 DIAGNOSIS — Z79899 Other long term (current) drug therapy: Secondary | ICD-10-CM

## 2017-12-29 DIAGNOSIS — R601 Generalized edema: Secondary | ICD-10-CM

## 2017-12-29 DIAGNOSIS — D649 Anemia, unspecified: Secondary | ICD-10-CM

## 2017-12-29 DIAGNOSIS — C7911 Secondary malignant neoplasm of bladder: Secondary | ICD-10-CM

## 2017-12-29 DIAGNOSIS — Z5111 Encounter for antineoplastic chemotherapy: Secondary | ICD-10-CM | POA: Diagnosis not present

## 2017-12-29 DIAGNOSIS — Z9013 Acquired absence of bilateral breasts and nipples: Secondary | ICD-10-CM

## 2017-12-29 DIAGNOSIS — N179 Acute kidney failure, unspecified: Secondary | ICD-10-CM

## 2017-12-29 DIAGNOSIS — C50412 Malignant neoplasm of upper-outer quadrant of left female breast: Secondary | ICD-10-CM

## 2017-12-29 LAB — CBC WITH DIFFERENTIAL/PLATELET
ABS IMMATURE GRANULOCYTES: 0.11 10*3/uL — AB (ref 0.00–0.07)
BASOS PCT: 1 %
Basophils Absolute: 0 10*3/uL (ref 0.0–0.1)
Eosinophils Absolute: 0.1 10*3/uL (ref 0.0–0.5)
Eosinophils Relative: 2 %
HCT: 30.5 % — ABNORMAL LOW (ref 36.0–46.0)
HEMOGLOBIN: 10 g/dL — AB (ref 12.0–15.0)
IMMATURE GRANULOCYTES: 2 %
LYMPHS PCT: 11 %
Lymphs Abs: 0.8 10*3/uL (ref 0.7–4.0)
MCH: 30.5 pg (ref 26.0–34.0)
MCHC: 32.8 g/dL (ref 30.0–36.0)
MCV: 93 fL (ref 80.0–100.0)
MONO ABS: 0.3 10*3/uL (ref 0.1–1.0)
Monocytes Relative: 5 %
NEUTROS ABS: 5.5 10*3/uL (ref 1.7–7.7)
NEUTROS PCT: 79 %
PLATELETS: 382 10*3/uL (ref 150–400)
RBC: 3.28 MIL/uL — ABNORMAL LOW (ref 3.87–5.11)
RDW: 13.2 % (ref 11.5–15.5)
WBC: 6.9 10*3/uL (ref 4.0–10.5)
nRBC: 0 % (ref 0.0–0.2)

## 2017-12-29 LAB — COMPREHENSIVE METABOLIC PANEL
ALBUMIN: 3.1 g/dL — AB (ref 3.5–5.0)
ALT: 81 U/L — ABNORMAL HIGH (ref 0–44)
ANION GAP: 8 (ref 5–15)
AST: 57 U/L — ABNORMAL HIGH (ref 15–41)
Alkaline Phosphatase: 156 U/L — ABNORMAL HIGH (ref 38–126)
BUN: 12 mg/dL (ref 6–20)
CHLORIDE: 108 mmol/L (ref 98–111)
CO2: 24 mmol/L (ref 22–32)
Calcium: 7.8 mg/dL — ABNORMAL LOW (ref 8.9–10.3)
Creatinine, Ser: 1.04 mg/dL — ABNORMAL HIGH (ref 0.44–1.00)
GFR calc Af Amer: >60 mL/min (ref >60)
GFR calc non Af Amer: 59 mL/min — ABNORMAL LOW (ref >60)
Glucose, Bld: 131 mg/dL — ABNORMAL HIGH (ref 70–99)
POTASSIUM: 4.1 mmol/L (ref 3.5–5.1)
Sodium: 140 mmol/L (ref 135–145)
Total Bilirubin: 0.4 mg/dL (ref 0.3–1.2)
Total Protein: 6.3 g/dL — ABNORMAL LOW (ref 6.5–8.1)

## 2017-12-29 MED ORDER — FAMOTIDINE IN NACL 20-0.9 MG/50ML-% IV SOLN
20.0000 mg | Freq: Once | INTRAVENOUS | Status: AC
  Administered 2017-12-29: 20 mg via INTRAVENOUS
  Filled 2017-12-29: qty 50

## 2017-12-29 MED ORDER — DEXAMETHASONE SODIUM PHOSPHATE 10 MG/ML IJ SOLN
10.0000 mg | Freq: Once | INTRAMUSCULAR | Status: AC
  Administered 2017-12-29: 10 mg via INTRAVENOUS
  Filled 2017-12-29: qty 1

## 2017-12-29 MED ORDER — SODIUM CHLORIDE 0.9% FLUSH
10.0000 mL | INTRAVENOUS | Status: DC | PRN
  Administered 2017-12-29: 10 mL via INTRAVENOUS
  Filled 2017-12-29: qty 10

## 2017-12-29 MED ORDER — HEPARIN SOD (PORK) LOCK FLUSH 100 UNIT/ML IV SOLN: 500.0000 [IU] | Freq: Once | INTRAVENOUS | Status: DC | PRN

## 2017-12-29 MED ORDER — SODIUM CHLORIDE 0.9 % IV SOLN
Freq: Once | INTRAVENOUS | Status: AC
  Administered 2017-12-29: 10:00:00 via INTRAVENOUS
  Filled 2017-12-29: qty 250

## 2017-12-29 MED ORDER — DIPHENHYDRAMINE HCL 50 MG/ML IJ SOLN
50.0000 mg | Freq: Once | INTRAMUSCULAR | Status: AC
  Administered 2017-12-29: 50 mg via INTRAVENOUS
  Filled 2017-12-29: qty 1

## 2017-12-29 MED ORDER — SODIUM CHLORIDE 0.9 % IV SOLN: 10.0000 mg | Freq: Once | INTRAVENOUS | Status: DC

## 2017-12-29 MED ORDER — HEPARIN SOD (PORK) LOCK FLUSH 100 UNIT/ML IV SOLN
500.0000 [IU] | Freq: Once | INTRAVENOUS | Status: AC
  Administered 2017-12-29: 500 [IU] via INTRAVENOUS
  Filled 2017-12-29: qty 5

## 2017-12-29 MED ORDER — SODIUM CHLORIDE 0.9 % IV SOLN
80.0000 mg/m2 | Freq: Once | INTRAVENOUS | Status: AC
  Administered 2017-12-29: 144 mg via INTRAVENOUS
  Filled 2017-12-29: qty 24

## 2017-12-29 NOTE — Progress Notes (Signed)
Hematology/Oncology Follow Up Note [facility]  [phone] [phone]  Patient Care Team: [name], MD as PCP - General (Internal Medicine)   Name of the patient: Mary [name]  [id number]  [birth year]   REASON FOR VISIT  follow-up for management of metastatic breast cancer.  Pertinent oncology history # She was diagnosed with right upper quadrant breast in August 2013, status post neoadjuvant chemotherapy with FEC x4 followed by weekly Taxol x2, underwent bilateral mastectomy [February 2014], right ALND, and left sentinel LN biopsy.  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  Tis N0.  Patient also received adjuvant radiation.   Patient was started initially on tamoxifen plus ovarian suppression with Zoladex in June 2014,  and changed to Aromasin plus Zoladex monthly since June 2015.follows up with Dr.Gudina Loleta Dicker at [facility] in [place]. reports that she twisted and sprained her left ankle when getting out of her car on 12/11/2017.  Ankle has been swollen.  #Patient presented to emergency room due to abnormal lab values.  Calcium level was 13.9 with creatinine 1.63.  Also endorses right lower quadrant/right flank/right lower back pain for the past couple of days.  Also having had episodes of dizziness which were diagnosed as vertigo since April 2019.  She had a negative MRI brain done at that time which were essentially negative for acute findings.  In the emergency room CT renal stone study were independently reviewed by me which showed bilateral hydronephrosis, due to the stricture at the level of UPJ's.  Masslike thickening of the anterior and the lateral bladder wall suspicious for metastatic implant.  Mesenteric, omental, peritoneal implants with small ascites and diffuse mesenteric edema.  Small to moderate bilateral pleural effusion  with associated compressive atelectasis of the lung.  Diffuse small osseous skeletal but stated disease.  Patient was seen and evaluated by urology Dr. Erlene Quan.  She is status post bilateral ureter stent placement and infiltrative bladder mass biopsy. Unfortunately her creatinine increased further during the admission and eventually she got bilateral percutaneous nephrostomy tube placed.  Creatinine gradually gets better after hydration. For her hypercalcemia, patient received 1 dose of calcitonin and 1 dose of Zometa.  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 She has visceral crisis with metastatic breast cancer.  In order to avoid further delay outpatient, she was started on first dose of Taxol 80 mg/m on 12/22/2017 after detailed discussion of chemotherapy side effects and complications.  #Left lower extremity DVT, she was anticoagulated with heparin drip.  Switched to Eliquis 5 mg twice daily outpatient. # Patient previously follows up at [facility] center.  She prefers to follow-up with [place] [facility] as it is closer to her home.  INTERVAL HISTORY 57 year old female with newly diagnosed metastatic breast lobular carcinoma presents for follow-up for assessment prior to chemotherapy treatment for metastatic breast cancer.  #Chemotherapy, reports mild nausea manageable with antiemetics at home.  No vomiting.  Appetite is good.  Denies any diarrhea.  Denies any neuropathy #Bilateral lower extremity swelling.  Swelling mildly improved after started on Lasix 20 mg.  She has been using it daily. She has lost weight 6 pounds which I think most of the more fluid weight. #Shortness of breath report is better. #Dizziness, improved after using scopolamine patch. #Lower  extremity DVT, takes Eliquis 5 mg twice daily.   Denies any active bleeding events. No issues with bilateral nephrostomy tubes.  Draining well. During interval, she has been to chemo class, had Mediport placed.  Also she was seen by Dr. Erlene Quan office with ureter stent removal.  Review of Systems  Constitutional: Positive for malaise/fatigue. Negative for chills, fever and weight loss.  HENT: Negative for nosebleeds and sore throat.   Eyes: Negative for double vision, photophobia and redness.  Respiratory: Positive for shortness of breath. Negative for cough and wheezing.   Cardiovascular: Positive for leg swelling. Negative for chest pain, palpitations and orthopnea.  Gastrointestinal: Negative for abdominal pain, blood in stool, nausea and vomiting.  Genitourinary: Negative for dysuria.  Musculoskeletal: Negative for back pain, myalgias and neck pain.  Skin: Negative for itching and rash.  Neurological: Negative for dizziness, tingling and tremors.  Endo/Heme/Allergies: Negative for environmental allergies. Does not bruise/bleed easily.  Psychiatric/Behavioral: Negative for depression.      Allergies  Allergen Reactions  . Adhesive [Tape] Other (See Comments)    Redness:Please use paper tape     Past Medical History:  Diagnosis Date  . Anxiety    takes Xanax prn  . Breast cancer [facility]) August 2013   bilateral, er/pr+  . Complication of anesthesia    pt states she had the shakes extremely bad  . Cough    at night d/t reflux;nothing productive  . Dental crowns present    x 4  . GERD (gastroesophageal reflux disease)    takes Nexium daily  . History of bronchitis    08/2011  . History of colon polyps   . Hot flashes, menopausal 08/08/2012  . IBS (irritable bowel syndrome)    immodium prn  . Insomnia    takes Ambien nightly  . Irritable bowel syndrome   . Neutropenia, drug-induced ([place]) 12/15/2011  . Nocturia   . Plantar fasciitis   . S/P radiation therapy 07/03/2012 through 08/18/2038                                                         Right chest wall/regional lymph nodes 5040 cGy 28 sessions, right chest wall/mastectomy scar boost 1000 cGy 5 sessions                          . Status post chemotherapy    FEC 100 starting on 12/08/2011 - 01/18/12/single agent Taxol x12 weeks from 02/02/2012 through January 2014   . Urinary frequency   . Use of tamoxifen (Nolvadex)   . Vertigo      Past Surgical History:  Procedure Laterality Date  . BREAST LUMPECTOMY  2008   left  . COLONOSCOPY    . COLONOSCOPY WITH PROPOFOL N/A 10/10/2017   Procedure: COLONOSCOPY WITH PROPOFOL;  Surgeon: Lucilla Lame, MD;  Location: [facility] ENDOSCOPY;  Service: Endoscopy;  Laterality: N/A;  . CYSTOSCOPY WITH STENT PLACEMENT Bilateral 12/17/2017   Procedure: CYSTOSCOPY WITH STENT PLACEMENT;  Surgeon: Hollice Espy, MD;  Location: [facility] ORS;  Service: Urology;  Laterality: Bilateral;  . IR NEPHROSTOMY PLACEMENT LEFT  12/19/2017  . IR NEPHROSTOMY PLACEMENT RIGHT  12/19/2017  . MASTECTOMY    . MASTECTOMY WITH AXILLARY LYMPH NODE DISSECTION Right 05/18/2012   Procedure: MASTECTOMY WITH AXILLARY LYMPH NODE DISSECTION;  Surgeon: Adin Hector, MD;  Location: [place];  Service: General;  Laterality: Right;  Bilateral Total Mastectomy , right axillary lymph node dissection left axillary sentinel node biopsy, removal of Porta Cath  . PORT-A-CATH REMOVAL N/A 05/18/2012   Procedure: REMOVAL PORT-A-CATH;  Surgeon: Adin Hector, MD;  Location: [place];  Service: General;  Laterality: N/A;  . PORTA CATH INSERTION N/A 12/26/2017   Procedure: PORTA CATH INSERTION;  Surgeon: Katha Cabal, MD;  Location: [place] CV LAB;  Service: Cardiovascular;  Laterality: N/A;  . PORTACATH PLACEMENT  12/02/2011   Procedure: INSERTION PORT-A-CATH;  Surgeon: Adin Hector, MD;  Location: [place];  Service: General;  Laterality: N/A;  insertion port a cath with fluoroscopy  . SIMPLE MASTECTOMY WITH AXILLARY SENTINEL NODE BIOPSY Left  05/18/2012   Procedure: SIMPLE MASTECTOMY WITH AXILLARY SENTINEL NODE BIOPSY;  Surgeon: Adin Hector, MD;  Location: [place];  Service: General;  Laterality: Left;    Social History   Socioeconomic History  . Marital status: Married    Spouse name: Not on file  . Number of children: 0  . Years of education: Not on file  . Highest education level: Not on file  Occupational History  . Occupation: Network engineer II    Employer: [employer]  Social Needs  . Financial resource strain: Not on file  . Food insecurity:    Worry: Not on file    Inability: Not on file  . Transportation needs:    Medical: Not on file    Non-medical: Not on file  Tobacco Use  . Smoking status: Former Smoker    Packs/day: 0.50    Years: 15.00    Pack years: 7.50    Types: Cigarettes    Last attempt to quit: 03/21/1997    Years since quitting: 20.7  . Smokeless tobacco: Never Used  Substance and Sexual Activity  . Alcohol use: Not Currently    Alcohol/week: 0.0 standard drinks    Comment: occasionally wine  . Drug use: No  . Sexual activity: Yes    Birth control/protection: None  Lifestyle  . Physical activity:    Days per week: Not on file    Minutes per session: Not on file  . Stress: Not on file  Relationships  . Social connections:    Talks on phone: Not on file    Gets together: Not on file    Attends religious service: Not on file    Active member of club or organization: Not on file    Attends meetings of clubs or organizations: Not on file    Relationship status: Not on file  . Intimate partner violence:    Fear of current or ex partner: Not on file    Emotionally abused: Not on file    Physically abused: Not on file    Forced sexual activity: Not on file  Other Topics Concern  . Not on file  Social History Narrative   Married to [employer]  No children 40 years old with fmp.  20 year use of birth control pills.      Family History  Problem Relation Age of Onset  . Hypertension  Mother   . Diabetes Mother   . Hypertension Maternal Grandmother   . Pancreatic cancer Paternal Grandmother        diagnosed in her 72s  . Ovarian cancer Maternal Aunt        maternal grandmother's sister  . Breast cancer  Paternal Aunt        diagnosed in late 26s early 83s  . Parkinsonism Paternal Grandfather      Current Outpatient Medications:  .  amLODipine (NORVASC) 5 MG tablet, TAKE 1 TABLET BY MOUTH DAILY., Disp: 90 tablet, Rfl: 1 .  apixaban (ELIQUIS) 5 MG TABS tablet, Two tablets twice a day for seven days then one tablet twice a day afterwards, Disp: 74 tablet, Rfl: 0 .  Ascorbic Acid (VITAMIN C) 100 MG tablet, Take 100 mg by mouth daily.  , Disp: , Rfl:  .  Calcium Carbonate-Vitamin D (CALTRATE 600+D PO), Take 1 tablet by mouth daily., Disp: , Rfl:  .  dexamethasone (DECADRON) 4 MG tablet, Take 1 tablet (4 mg total) by mouth See admin instructions. You may take 4 mg daily for 2 days after chemotherapy if needed for nausea, Disp: 30 tablet, Rfl: 0 .  diazepam (VALIUM) 10 MG tablet, Take 1 tablet (10 mg total) by mouth at bedtime as needed for anxiety., Disp: 30 tablet, Rfl: 5 .  esomeprazole (NEXIUM) 40 MG capsule, Take 40 mg by mouth daily before breakfast., Disp: , Rfl:  .  furosemide (LASIX) 20 MG tablet, Take 1 tablet (20 mg total) by mouth daily as needed., Disp: 10 tablet, Rfl: 0 .  lidocaine-prilocaine (EMLA) cream, Apply to affected area once, Disp: 30 g, Rfl: 3 .  loperamide (IMODIUM A-D) 2 MG tablet, Take 2 mg by mouth 4 (four) times daily as needed for diarrhea or loose stools. , Disp: , Rfl:  .  Multiple Vitamin (MULTIVITAMIN) tablet, Take 1 tablet by mouth daily.  , Disp: , Rfl:  .  ondansetron (ZOFRAN) 4 MG tablet, Take 1 tablet (4 mg total) by mouth every 8 (eight) hours as needed for nausea or vomiting., Disp: 20 tablet, Rfl: 0 .  oxybutynin (DITROPAN) 5 MG tablet, Take 1 tablet (5 mg total) by mouth every 8 (eight) hours as needed for bladder spasms., Disp: 20  tablet, Rfl: 0 .  potassium chloride SA (K-DUR,KLOR-CON) 20 MEQ tablet, Take 1 tablet (20 mEq total) by mouth daily., Disp: 30 tablet, Rfl: 0 .  prochlorperazine (COMPAZINE) 10 MG tablet, Take 1 tablet (10 mg total) by mouth every 6 (six) hours as needed (Nausea or vomiting)., Disp: 30 tablet, Rfl: 1 .  scopolamine (TRANSDERM-SCOP) 1 MG/3DAYS, Place 1 patch (1.5 mg total) onto the skin every 3 (three) days., Disp: 10 patch, Rfl: 0 .  traMADol (ULTRAM) 50 MG tablet, Take 50 mg by mouth every 6 (six) hours as needed., Disp: , Rfl: 0 .  traZODone (DESYREL) 50 MG tablet, Take 0.5-1 tablets (25-50 mg total) by mouth at bedtime as needed for sleep., Disp: 90 tablet, Rfl: 1 No current facility-administered medications for this visit.   Facility-Administered Medications Ordered in Other Visits:  .  heparin lock flush 100 unit/mL, 500 Units, Intravenous, Once, Earlie Server, MD .  heparin lock flush 100 unit/mL, 500 Units, Intracatheter, Once PRN, Earlie Server, MD .  PACLitaxel (TAXOL) 144 mg in sodium chloride 0.9 % 250 mL chemo infusion (</= 80mg /m2), 80 mg/m2 (Treatment Plan Recorded), Intravenous, Once, Earlie Server, MD, Last Rate: 274 mL/hr at 12/29/17 1113, 144 mg at 12/29/17 1113 .  sodium chloride flush (NS) 0.9 % injection 10 mL, 10 mL, Intravenous, PRN, Earlie Server, MD, 10 mL at 12/29/17 0918  Physical exam: ECOG 2 Vitals:   12/29/17 1006  BP: 117/75  Pulse: 93  Resp: 16  Temp: (!) 96.1 F (35.6 C)  TempSrc: Tympanic  Weight: 172 lb (78 kg)   Physical Exam  Constitutional: She is oriented to person, place, and time. No distress.  HENT:  Head: Normocephalic and atraumatic.  Mouth/Throat: Oropharynx is clear and moist.  Eyes: Pupils are equal, round, and reactive to light. EOM are normal. No scleral icterus.  Neck: Normal range of motion. Neck supple.  Cardiovascular: Normal rate, regular rhythm and normal heart sounds.  Pulmonary/Chest: Effort normal. No respiratory distress. She has no wheezes.    Decreased breath sounds at right lower base, improved.  Abdominal: Soft. Bowel sounds are normal. She exhibits no distension and no mass. There is no tenderness.  Genitourinary:  Genitourinary Comments: Bilateral percutaneous nephrostomy tube, bloodstained urine in the nephrostomy bag.  Musculoskeletal: Normal range of motion. She exhibits edema. She exhibits no deformity.  Bilateral 3+ pitting edema  Neurological: She is alert and oriented to person, place, and time. No cranial nerve deficit. Coordination normal.  Skin: Skin is warm and dry. No rash noted. No erythema.  Psychiatric: She has a normal mood and affect. Her behavior is normal.    CMP Latest Ref Rng & Units 12/29/2017  Glucose 70 - 99 mg/dL 131(H)  BUN 6 - 20 mg/dL 12  Creatinine 0.44 - 1.00 mg/dL 1.04(H)  Sodium 135 - 145 mmol/L 140  Potassium 3.5 - 5.1 mmol/L 4.1  Chloride 98 - 111 mmol/L 108  CO2 22 - 32 mmol/L 24  Calcium 8.9 - 10.3 mg/dL 7.8(L)  Total Protein 6.5 - 8.1 g/dL 6.3(L)  Total Bilirubin 0.3 - 1.2 mg/dL 0.4  Alkaline Phos 38 - 126 U/L 156(H)  AST 15 - 41 U/L 57(H)  ALT 0 - 44 U/L 81(H)   CBC Latest Ref Rng & Units 12/29/2017  WBC 4.0 - 10.5 K/uL 6.9  Hemoglobin 12.0 - 15.0 g/dL 10.0(L)  Hematocrit 36.0 - 46.0 % 30.5(L)  Platelets 150 - 400 K/uL 382    Dg Chest 2 View  Result Date: 12/20/2017 CLINICAL DATA:  Shortness of breath. Weakness. EXAM: CHEST - 2 VIEW COMPARISON:  12/02/2011 FINDINGS: The cardiac silhouette is normal in size. Left chest Port-A-Cath has been removed. Surgical clips are present in the axillae. There are new moderate right and small left pleural effusions with atelectasis or consolidation in both lung bases, particularly medially. No pneumothorax is identified. No acute osseous abnormality is seen. IMPRESSION: New moderate right and small left pleural effusions with bibasilar atelectasis or consolidation. Electronically Signed   By: Logan Bores M.D.   On: 12/20/2017 13:43   US  Renal  Result Date: 12/18/2017 CLINICAL DATA:  Hydronephrosis aunt LOWER abdominal pain for 2-3 months. History of cystoscopy with bilateral stent placement 12/17/2017. EXAM: RENAL / URINARY TRACT ULTRASOUND COMPLETE COMPARISON:  CT of the abdomen and pelvis on 12/16/2017 FINDINGS: Right Kidney: Length: 12.1 centimeters. There is moderate hydronephrosis. Stent is visualized within the RIGHT renal pelvis. No solid or cystic renal mass. Left Kidney: Length: 12.7 centimeter. There is moderate hydronephrosis. The ureteral stent is not well seen in the LEFT renal pelvis. Bladder: Bladder is decompressed by a Foley catheter. Additional: There are bilateral pleural effusions.  Ascites. IMPRESSION: 1. Moderate bilateral hydronephrosis. 2. Foley catheter decompresses the bladder. 3. Bilateral pleural effusions.  Ascites. Electronically Signed   By: Nolon Nations M.D.   On: 12/18/2017 16:36   US Venous Img Lower Unilateral Left  Result Date: 12/16/2017 CLINICAL DATA:  Lower extremity edema EXAM: LEFT LOWER EXTREMITY VENOUS DUPLEX ULTRASOUND TECHNIQUE: Doppler venous assessment  of the left lower extremity deep venous system was performed, including characterization of spectral flow, compressibility, and phasicity. COMPARISON:  None. FINDINGS: There is complete compressibility of the left common femoral and femoral veins. The left popliteal vein is partially occlusive and contains partially occlusive DVT. Thrombus is mixed echogenicity with areas of hypoechoic and hyperechoic signal. There is occlusive thrombus in the posterior tibial and peroneal veins. Doppler analysis demonstrates respiratory phasicity and augmentation. IMPRESSION: The study is positive for DVT in the popliteal, posterior tibial, and peroneal veins as described. Critical Value/emergent results were called by telephone at the time of interpretation on 12/16/2017 at 9:24 am to Dr. Alfred Levins, who verbally acknowledged these results. Electronically Signed    By: Marybelle Killings M.D.   On: 12/16/2017 09:25   Ct Renal Stone Study  Result Date: 12/16/2017 CLINICAL DATA:  57 year old female with abdominal pain. History of breast cancer and bilateral mastectomy. Patient is on chemo and radiation treatment. EXAM: CT ABDOMEN AND PELVIS WITHOUT CONTRAST TECHNIQUE: Multidetector CT imaging of the abdomen and pelvis was performed following the standard protocol without IV contrast. COMPARISON:  PET-CT dated 12/01/2011 FINDINGS: Evaluation of this exam is limited in the absence of intravenous contrast. Lower chest: Partially visualized small to moderate bilateral pleural effusions with associated partial compressive atelectasis of the adjacent lungs and areas of linear atelectasis/scarring. No intra-abdominal free air. Diffuse mesenteric edema and stranding and small ascites. Hepatobiliary: Subcentimeter hypodense focus in the right lobe of the liver is not characterized. The liver is otherwise unremarkable on this noncontrast CT. Probable tiny stone or sludge within the gallbladder. Pancreas: Unremarkable. No pancreatic ductal dilatation or surrounding inflammatory changes. Spleen: Normal in size without focal abnormality. Adrenals/Urinary Tract: Mild bilateral adrenal thickening. There is moderate bilateral hydronephrosis. The ureters are nondistended. The hydronephrosis is likely related to a degree of stricture at the ureteropelvic junctions secondary to retroperitoneal adhesions/fibrosis. No stone identified within the kidneys or along the course of the ureters. There is masslike thickening of the anterior and left lateral bladder wall likely metastatic disease. Stomach/Bowel: There is diffuse thickening of the stomach which may be partly related to underdistention and ascites. Gastritis or an infiltrative process is not entirely excluded. There is no bowel obstruction. The appendix is normal as visualized. Vascular/Lymphatic: Mild aortoiliac atherosclerotic disease. The  abdominal aorta and IVC are grossly unremarkable on this noncontrast CT. No portal venous gas. There are multiple somewhat small retroperitoneal and para-aortic lymph nodes. A mildly enlarged right iliac chain node measures 11 mm in short axis. There is diffuse mesenteric and omental nodularity and edema consistent with metastatic implants. Multiple top-normal and mildly rounded mesenteric lymph nodes measure up to 8 mm in short axis. Reproductive: Evaluation of the pelvic structures is very limited. The uterus appears retroflexed. A 3.3 x 3.9 x 2.6 cm ill-defined high attenuating solid lesion to the right of the uterine fundus is not well evaluated and may represent a fibroid or a complex lesion arising from the right adnexa. However, a neoplastic mass is not excluded. Pelvic ultrasound may provide better evaluation. Other: There is mild diffuse subcutaneous edema of the abdominal wall. Musculoskeletal: Multiple small scattered sclerotic lesions throughout the spine most consistent with metastatic disease. No acute fracture. IMPRESSION: 1. Moderate bilateral hydronephrosis, likely related to strictures at the level of the UPJ's, and likely secondary to retroperitoneal adhesions or fibrosis. No obstructing stone. 2. Masslike thickening of the anterior and left lateral bladder wall suspicious for metastatic implant. 3. Mesenteric, omental, and peritoneal  implants with a small ascites and diffuse mesenteric edema. 4. Small to moderate bilateral pleural effusions with associated compressive atelectasis of the lungs. 5. Diffuse small osseous sclerotic metastatic disease. Electronically Signed   By: Anner Crete M.D.   On: 12/16/2017 06:53   Ir Nephrostomy Placement Left  Result Date: 12/20/2017 INDICATION: Bilateral ureteral obstruction with occluded ureteral stents EXAM: IR NEPHROSTOMY PLACEMENT LEFT COMPARISON:  None. MEDICATIONS: 2 g IV Ancef; The antibiotic was administered in an appropriate time frame prior  to skin puncture. ANESTHESIA/SEDATION: Fentanyl 75 mcg IV; Versed 1 mg IV Moderate Sedation Time:  35 The patient was continuously monitored during the procedure by the interventional radiology nurse under my direct supervision. CONTRAST:  36mL ISOVUE-300 IOPAMIDOL (ISOVUE-300) INJECTION 61% - administered into the collecting system(s) FLUOROSCOPY TIME:  Fluoroscopy Time: 5 minutes 48 seconds (356 mGy). COMPLICATIONS: None immediate. PROCEDURE: Informed written consent was obtained from the patient after a thorough discussion of the procedural risks, benefits and alternatives. All questions were addressed. Maximal Sterile Barrier Technique was utilized including caps, mask, sterile gowns, sterile gloves, sterile drape, hand hygiene and skin antiseptic. A timeout was performed prior to the initiation of the procedure. Utilizing 1% lidocaine as a local and deep anesthetic and fluoroscopic guidance, a 21 gauge needle was utilized to puncture the collecting system in the region of the left renal pelvis guided by a previously placed stent. Brown colored urine likely related to prior hemorrhage was withdrawn from the needle and contrast injection confirmed location within the collecting system. Subsequently a small amount of room air was injected to allow for delineation of posterior caliceal system. A lower pole posterior calyx was identified and again utilizing 1% xylocaine as local anesthetic and fluoroscopic guidance, a 20 gauge diamond tip needle was passed into the posterior calyx. Both air and urine were withdrawn from the needle and subsequently a 0.018 platinum tip guidewire was advanced into the collecting system. Over this, a 6 [place] Accustick dilator was placed into the collecting system and a J wire coiled within the renal pelvis. The axis sick dilator was then removed and the tract dilated to 8 [place]. The dilator was then removed and a 8 French pigtail catheter was placed over the wire and coiled within  the renal pelvis. The catheter was initially placed adjacent to the body of the stent although no significant drainage of urine was noted. Contrast material was injected which demonstrates a significant amount of thrombus within the collecting system and for this reason the catheter was advanced into the superior aspect of the renal pelvis where adequate urine drainage was noted. The catheter was then sutured into place utilizing 0 Prolene and a [place] buttress. Catheter was placed external gravity drain and bloody urine was obtained. The patient tolerated the catheter placement well and attention was then turned to the right collecting system. IMPRESSION: Successful placement of an 8 French pigtail catheter within the left renal collecting system. Thrombus is noted within the collecting system which may limit drainage somewhat. This should resolve over time. Electronically Signed   By: Inez Catalina M.D.   On: 12/20/2017 08:02   Ir Nephrostomy Placement Right  Result Date: 12/20/2017 INDICATION: Ureteral occlusion with occluded ureteral stents EXAM: IR NEPHROSTOMY PLACEMENT RIGHT COMPARISON:  None. MEDICATIONS: 2 g IV Ancef; The antibiotic was administered in an appropriate time frame prior to skin puncture. ANESTHESIA/SEDATION: Fentanyl 75 mcg IV; Versed 1 mg IV Moderate Sedation Time:  35 The patient was continuously monitored during the procedure  by the interventional radiology nurse under my direct supervision. CONTRAST:  63mL ISOVUE-300 IOPAMIDOL (ISOVUE-300) INJECTION 61% - administered into the collecting system(s) FLUOROSCOPY TIME:  Fluoroscopy Time: 5 minutes 48 seconds (356 mGy). COMPLICATIONS: None immediate. PROCEDURE: Informed written consent was obtained from the patient after a thorough discussion of the procedural risks, benefits and alternatives. All questions were addressed. Maximal Sterile Barrier Technique was utilized including caps, mask, sterile gowns, sterile gloves, sterile drape, hand  hygiene and skin antiseptic. A timeout was performed prior to the initiation of the procedure. Utilizing 1% xylocaine as local anesthetic and fluoroscopic guidance a 21 gauge Chiba needle was placed into the right collecting system utilizing the pigtail from the previously placed ureteral stent as a guide. Brown tinged urine was withdrawn from the needle and subsequently a small amount of contrast was instilled to confirm needle placement. This demonstrates dilated collecting system. Subsequently a small amount of room air was instilled to allow for delineation of a posterior calyx. Again utilizing 1% xylocaine as local anesthetic and fluoroscopic guidance, a 20 gauge diamond tip needle was placed into the upper pole posterior calyx without difficulty. A 0.018 platinum tip guidewire was then advanced into the collecting system and coiled within the renal pelvis. A 6 French Accustick dilator was then placed over the microwire into the collecting system. The central dilator in stiff near were then removed and a J tipped guidewire was coiled within the renal pelvis. Dilatation to 10 [place] was then performed and a 10 French nephrostomy catheter placed over the guidewire into the renal pelvis. Brisk return of bloody urine was then noted. The catheter was then attached to an external gravity bag. The catheter was sutured into place utilizing 0 Prolene and a Hopkins buttress. Patient tolerated the procedure well and was returned to her room in satisfactory condition. IMPRESSION: Successful right percutaneous nephrostomy placement as described above. Bloody urine was noted at the termination of the procedure which should clear over time. Electronically Signed   By: Inez Catalina M.D.   On: 12/20/2017 08:06     Assessment and plan Patient is a 57 y.o. female with metastatic breast cancer, visceral crisis present for follow-up prior to chemotherapy treatment. 1. Metastatic breast cancer ([place])   2. Generalized edema     3. Acute deep vein thrombosis (DVT) of proximal vein of left lower extremity ([place])   4. 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 AKI (acute kidney injury) ([place])   6. Hypoalbuminemia   7. Dizziness   8. Anemia, unspecified type   9. Hypercalcemia    #Metastatic breast cancer,  Tolerates cycle 1 day 1 Taxol treatment.  Manageable side effects. Labs reviewed and discussed with patient.  Counts acceptable to proceed today's Taxol treatment.  #Chronic dizziness, questionable vertigo versus CNS involvement.  She has MRI scheduled next week. #Generalized edema, In the interval she has had 2D echo done which showed normal left ventricular ejection fraction. This is most likely secondary to previous kidney impairment/fluid overload/hypoalbuminemia. Discussed with patient about increasing nutrition intake.  Recommend Ensure twice daily Continue Lasix 20 mg daily as needed.  I recommend her to use every other day but if she feels that she has increased edema, she may use it daily.  #Lower extremity DVT in metastatic cancer setting.  Continue with Eliquis 5 mg twice daily.  Duration of anticoagulation is long-term.    #AKI due to post renal obstruction, status post bilateral nephrostomy tube placement.  Creatinine stable.  At baseline.   #  Anemia, secondary to blood loss and chemotherapy.  Slightly decreased from last visit.  Continue to monitor. #Hypercalcemia, received calcitonin and Zometa during recent admission.  Calcium level stable.  Continue to monitor. #Transaminitis, likely secondary to recent chemotherapy Taxol.  Continue to monitor.  Normal bilirubin   Follow-up in 1 week for symptom assessment and chemotherapy treatment. We spent sufficient time to discuss many aspect of care, questions were answered to patient's satisfaction.  Total face to face encounter time for this patient visit was 25 min. >50% of the time was  spent in counseling and coordination of care.    Earlie Server, MD, PhD Hematology  Oncology [facility] at [facility] Pager- [phone] 12/29/2017

## 2017-12-29 NOTE — Progress Notes (Signed)
  Oncology Nurse Navigator Documentation  Navigator Location: [facility] (12/29/17 1300)   )Navigator Encounter Type: Initial MedOnc (12/29/17 1300)                     Patient Visit Type: Initial (12/29/17 1300)   Barriers/Navigation Needs: Education;Coordination of Care (12/29/17 1300)   Interventions: Psycho-social support;Education;Coordination of Care (12/29/17 1300)                      Time Spent with Patient: 45 (12/29/17 1300)   Patient known to me.  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  Initial breast cancer diagnosed in 2013, an patient was treated at [facility].  Introduced IT trainer.  Discussed outpatient services.  Support to patient and family.

## 2018-01-02 ENCOUNTER — Telehealth: Payer: Self-pay | Admitting: *Deleted

## 2018-01-02 ENCOUNTER — Encounter: Payer: Self-pay | Admitting: Interventional Radiology

## 2018-01-02 ENCOUNTER — Other Ambulatory Visit (HOSPITAL_COMMUNITY): Payer: Self-pay | Admitting: Interventional Radiology

## 2018-01-02 ENCOUNTER — Other Ambulatory Visit: Payer: Self-pay | Admitting: Oncology

## 2018-01-02 ENCOUNTER — Ambulatory Visit
Admission: RE | Admit: 2018-01-02 | Discharge: 2018-01-02 | Disposition: A | Discharge status: Home / Self Care | Payer: 59 | Source: Ambulatory Visit | Attending: Interventional Radiology | Admitting: Interventional Radiology

## 2018-01-02 DIAGNOSIS — Y733 Surgical instruments, materials and gastroenterology and urology devices (including sutures) associated with adverse incidents: Secondary | ICD-10-CM | POA: Diagnosis not present

## 2018-01-02 DIAGNOSIS — C50919 Malignant neoplasm of unspecified site of unspecified female breast: Secondary | ICD-10-CM

## 2018-01-02 DIAGNOSIS — T83038A Leakage of other indwelling urethral catheter, initial encounter: Secondary | ICD-10-CM | POA: Diagnosis not present

## 2018-01-02 DIAGNOSIS — T83032A Leakage of nephrostomy catheter, initial encounter: Secondary | ICD-10-CM | POA: Diagnosis not present

## 2018-01-02 DIAGNOSIS — C679 Malignant neoplasm of bladder, unspecified: Secondary | ICD-10-CM

## 2018-01-02 DIAGNOSIS — Z853 Personal history of malignant neoplasm of breast: Secondary | ICD-10-CM | POA: Insufficient documentation

## 2018-01-02 DIAGNOSIS — N1330 Unspecified hydronephrosis: Secondary | ICD-10-CM | POA: Insufficient documentation

## 2018-01-02 HISTORY — PX: IR NEPHROSTOMY EXCHANGE RIGHT: IMG6070

## 2018-01-02 MED ORDER — IOPAMIDOL (ISOVUE-300) INJECTION 61%
30.0000 mL | Freq: Once | INTRAVENOUS | Status: AC | PRN
  Administered 2018-01-02: 5 mL

## 2018-01-02 MED ORDER — LIDOCAINE HCL (PF) 1 % IJ SOLN
INTRAMUSCULAR | Status: AC
  Filled 2018-01-02: qty 30

## 2018-01-02 NOTE — Progress Notes (Signed)
Per pt request, called [facility] to leave message for Dr Tasia Catchings. Forwarded pt concern for pain medication if needed and home health to teach nephrostomy tube care and flushing to husband. [place] took message and agreed to have staff call husband back.

## 2018-01-02 NOTE — Telephone Encounter (Signed)
Noted. Thanks.

## 2018-01-02 NOTE — Telephone Encounter (Signed)
Husband called with concerns for pain medicine post nephrostomy tube Please advise

## 2018-01-02 NOTE — Telephone Encounter (Signed)
Called pt's husband.  He stated that patient has a couple Tramadol on hand but is worried with the pain she has experienced today with the nephrostomy tubes that she many need a refill before pt is seen on Friday.  Also husband request home health to be ordered for nephrostomy care teaching.  Discussed with Dr Tasia Catchings, Dr Tasia Catchings has agreed to refill Tramadol and sign home health order tomorrow when she returns in the office.

## 2018-01-02 NOTE — Telephone Encounter (Signed)
Husband called to [place] that patient had Nephrostomy tubne inserted yesterday and it is leaking a lot and has saturated her dressing. He wants to know what to do about it. I contacted [facility] and they will have the procedure nurse call him when she gets out of the current procedure she is assisting with

## 2018-01-02 NOTE — Procedures (Signed)
Pre Procedure Dx: Hydronephrosis Post Procedure Dx: Same  Successful right sided PCN exchange.    EBL: None No immediate complications.   PLAN:  - The etiology of the patient's right-sided nephrostomy catheter leakage is favored to be secondary to occlusion of the nephrostomy tube due to blood within the right renal collecting system likely at least partially attributable to the patient's anticoagulated state (patient is on Xarelto for lower extremity DVT).  As such, patient was instructed to flush both nephrostomy catheters twice a day with 5-10 cc of saline to ensure patency of both nephrostomy catheters.   - Routine fluoroscopic guided exchange will be scheduled 2 months following placement (early December).  Consideration for conversion of the patient's bilateral nephrostomy catheters to bilateral nephroureteral catheters could be considered at that time as indicated.    Ronny Bacon, MD Pager #: [phone]

## 2018-01-03 ENCOUNTER — Other Ambulatory Visit: Payer: Self-pay | Admitting: Oncology

## 2018-01-03 ENCOUNTER — Ambulatory Visit
Admission: RE | Admit: 2018-01-03 | Discharge: 2018-01-03 | Disposition: A | Discharge status: Home / Self Care | Payer: 59 | Source: Ambulatory Visit | Attending: Oncology | Admitting: Oncology

## 2018-01-03 DIAGNOSIS — R42 Dizziness and giddiness: Secondary | ICD-10-CM | POA: Insufficient documentation

## 2018-01-03 DIAGNOSIS — Q283 Other malformations of cerebral vessels: Secondary | ICD-10-CM | POA: Insufficient documentation

## 2018-01-03 DIAGNOSIS — C50919 Malignant neoplasm of unspecified site of unspecified female breast: Secondary | ICD-10-CM | POA: Diagnosis not present

## 2018-01-03 DIAGNOSIS — Q279 Congenital malformation of peripheral vascular system, unspecified: Secondary | ICD-10-CM | POA: Diagnosis not present

## 2018-01-03 MED ORDER — GADOBENATE DIMEGLUMINE 529 MG/ML IV SOLN
15.0000 mL | Freq: Once | INTRAVENOUS | Status: AC | PRN
  Administered 2018-01-03: 15 mL via INTRAVENOUS

## 2018-01-03 MED ORDER — TRAMADOL HCL 50 MG PO TABS: 50.0000 mg | ORAL_TABLET | Freq: Four times a day (QID) | ORAL | 0 refills | Status: DC | PRN

## 2018-01-04 DIAGNOSIS — C799 Secondary malignant neoplasm of unspecified site: Secondary | ICD-10-CM | POA: Diagnosis not present

## 2018-01-04 DIAGNOSIS — Z7901 Long term (current) use of anticoagulants: Secondary | ICD-10-CM | POA: Diagnosis not present

## 2018-01-04 DIAGNOSIS — Z436 Encounter for attention to other artificial openings of urinary tract: Secondary | ICD-10-CM | POA: Diagnosis not present

## 2018-01-04 DIAGNOSIS — Z936 Other artificial openings of urinary tract status: Secondary | ICD-10-CM | POA: Diagnosis not present

## 2018-01-04 DIAGNOSIS — C50919 Malignant neoplasm of unspecified site of unspecified female breast: Secondary | ICD-10-CM | POA: Diagnosis not present

## 2018-01-05 ENCOUNTER — Other Ambulatory Visit: Payer: Self-pay

## 2018-01-05 ENCOUNTER — Encounter: Payer: Self-pay | Admitting: Oncology

## 2018-01-05 ENCOUNTER — Inpatient Hospital Stay: Payer: 59

## 2018-01-05 ENCOUNTER — Telehealth: Payer: Self-pay | Admitting: Radiology

## 2018-01-05 ENCOUNTER — Inpatient Hospital Stay (HOSPITAL_BASED_OUTPATIENT_CLINIC_OR_DEPARTMENT_OTHER): Payer: 59 | Admitting: Oncology

## 2018-01-05 ENCOUNTER — Ambulatory Visit: Payer: 59

## 2018-01-05 VITALS — BP 114/75 | HR 92 | Temp 97.1°F | Resp 18 | Wt 154.7 lb

## 2018-01-05 DIAGNOSIS — Z5111 Encounter for antineoplastic chemotherapy: Secondary | ICD-10-CM | POA: Diagnosis not present

## 2018-01-05 DIAGNOSIS — R42 Dizziness and giddiness: Secondary | ICD-10-CM | POA: Diagnosis not present

## 2018-01-05 DIAGNOSIS — N179 Acute kidney failure, unspecified: Secondary | ICD-10-CM

## 2018-01-05 DIAGNOSIS — R601 Generalized edema: Secondary | ICD-10-CM | POA: Diagnosis not present

## 2018-01-05 DIAGNOSIS — C50919 Malignant neoplasm of unspecified site of unspecified female breast: Secondary | ICD-10-CM

## 2018-01-05 DIAGNOSIS — C7911 Secondary malignant neoplasm of bladder: Secondary | ICD-10-CM

## 2018-01-05 DIAGNOSIS — C50411 Malignant neoplasm of upper-outer quadrant of right female breast: Secondary | ICD-10-CM | POA: Diagnosis not present

## 2018-01-05 DIAGNOSIS — Z79899 Other long term (current) drug therapy: Secondary | ICD-10-CM

## 2018-01-05 DIAGNOSIS — Z9013 Acquired absence of bilateral breasts and nipples: Secondary | ICD-10-CM

## 2018-01-05 DIAGNOSIS — I824Y2 Acute embolism and thrombosis of unspecified deep veins of left proximal lower extremity: Secondary | ICD-10-CM

## 2018-01-05 DIAGNOSIS — Z936 Other artificial openings of urinary tract status: Secondary | ICD-10-CM

## 2018-01-05 DIAGNOSIS — R740 Nonspecific elevation of levels of transaminase and lactic acid dehydrogenase [LDH]: Secondary | ICD-10-CM

## 2018-01-05 DIAGNOSIS — E8809 Other disorders of plasma-protein metabolism, not elsewhere classified: Secondary | ICD-10-CM

## 2018-01-05 DIAGNOSIS — D6489 Other specified anemias: Secondary | ICD-10-CM

## 2018-01-05 DIAGNOSIS — I82459 Acute embolism and thrombosis of unspecified peroneal vein: Secondary | ICD-10-CM

## 2018-01-05 DIAGNOSIS — Z9221 Personal history of antineoplastic chemotherapy: Secondary | ICD-10-CM

## 2018-01-05 DIAGNOSIS — Z7901 Long term (current) use of anticoagulants: Secondary | ICD-10-CM

## 2018-01-05 DIAGNOSIS — D649 Anemia, unspecified: Secondary | ICD-10-CM

## 2018-01-05 DIAGNOSIS — C50412 Malignant neoplasm of upper-outer quadrant of left female breast: Secondary | ICD-10-CM

## 2018-01-05 LAB — COMPREHENSIVE METABOLIC PANEL
ALBUMIN: 3.4 g/dL — AB (ref 3.5–5.0)
ALT: 68 U/L — AB (ref 0–44)
AST: 67 U/L — AB (ref 15–41)
Alkaline Phosphatase: 152 U/L — ABNORMAL HIGH (ref 38–126)
Anion gap: 8 (ref 5–15)
BILIRUBIN TOTAL: 0.4 mg/dL (ref 0.3–1.2)
BUN: 9 mg/dL (ref 6–20)
CHLORIDE: 107 mmol/L (ref 98–111)
CO2: 25 mmol/L (ref 22–32)
Calcium: 7.8 mg/dL — ABNORMAL LOW (ref 8.9–10.3)
Creatinine, Ser: 0.93 mg/dL (ref 0.44–1.00)
GFR calc Af Amer: >60 mL/min (ref >60)
GLUCOSE: 123 mg/dL — AB (ref 70–99)
Potassium: 4.1 mmol/L (ref 3.5–5.1)
Sodium: 140 mmol/L (ref 135–145)
Total Protein: 6.4 g/dL — ABNORMAL LOW (ref 6.5–8.1)

## 2018-01-05 LAB — CBC WITH DIFFERENTIAL/PLATELET
ABS IMMATURE GRANULOCYTES: 0.04 10*3/uL (ref 0.00–0.07)
Basophils Absolute: 0.1 10*3/uL (ref 0.0–0.1)
Basophils Relative: 1 %
Eosinophils Absolute: 0.1 10*3/uL (ref 0.0–0.5)
Eosinophils Relative: 2 %
HEMATOCRIT: 29.2 % — AB (ref 36.0–46.0)
HEMOGLOBIN: 9.6 g/dL — AB (ref 12.0–15.0)
Immature Granulocytes: 1 %
LYMPHS ABS: 0.5 10*3/uL — AB (ref 0.7–4.0)
LYMPHS PCT: 14 %
MCH: 30.8 pg (ref 26.0–34.0)
MCHC: 32.9 g/dL (ref 30.0–36.0)
MCV: 93.6 fL (ref 80.0–100.0)
MONO ABS: 0.2 10*3/uL (ref 0.1–1.0)
MONOS PCT: 7 %
NEUTROS ABS: 2.7 10*3/uL (ref 1.7–7.7)
Neutrophils Relative %: 75 %
Platelets: 347 10*3/uL (ref 150–400)
RBC: 3.12 MIL/uL — AB (ref 3.87–5.11)
RDW: 13.6 % (ref 11.5–15.5)
WBC: 3.6 10*3/uL — AB (ref 4.0–10.5)
nRBC: 0 % (ref 0.0–0.2)

## 2018-01-05 LAB — IRON AND TIBC
Iron: 29 ug/dL (ref 28–170)
Saturation Ratios: 12 % (ref 10.4–31.8)
TIBC: 251 ug/dL (ref 250–450)
UIBC: 222 ug/dL

## 2018-01-05 LAB — FERRITIN: FERRITIN: 811 ng/mL — AB (ref 11–307)

## 2018-01-05 MED ORDER — FERROUS SULFATE 325 (65 FE) MG PO TBEC: 325.0000 mg | DELAYED_RELEASE_TABLET | Freq: Every day | ORAL | 3 refills | Status: DC

## 2018-01-05 MED ORDER — FUROSEMIDE 20 MG PO TABS: 20.0000 mg | ORAL_TABLET | Freq: Every day | ORAL | 1 refills | Status: DC | PRN

## 2018-01-05 MED ORDER — HEPARIN SOD (PORK) LOCK FLUSH 100 UNIT/ML IV SOLN
500.0000 [IU] | Freq: Once | INTRAVENOUS | Status: AC
  Administered 2018-01-05: 500 [IU] via INTRAVENOUS

## 2018-01-05 MED ORDER — SODIUM CHLORIDE 0.9 % IV SOLN
80.0000 mg/m2 | Freq: Once | INTRAVENOUS | Status: AC
  Administered 2018-01-05: 144 mg via INTRAVENOUS
  Filled 2018-01-05: qty 24

## 2018-01-05 MED ORDER — DEXAMETHASONE SODIUM PHOSPHATE 10 MG/ML IJ SOLN
10.0000 mg | Freq: Once | INTRAMUSCULAR | Status: AC
  Administered 2018-01-05: 10 mg via INTRAVENOUS
  Filled 2018-01-05: qty 1

## 2018-01-05 MED ORDER — FAMOTIDINE IN NACL 20-0.9 MG/50ML-% IV SOLN
20.0000 mg | Freq: Once | INTRAVENOUS | Status: AC
  Administered 2018-01-05: 20 mg via INTRAVENOUS
  Filled 2018-01-05: qty 50

## 2018-01-05 MED ORDER — SODIUM CHLORIDE 0.9 % IV SOLN
Freq: Once | INTRAVENOUS | Status: AC
  Administered 2018-01-05: 10:00:00 via INTRAVENOUS
  Filled 2018-01-05: qty 250

## 2018-01-05 MED ORDER — DIPHENHYDRAMINE HCL 50 MG/ML IJ SOLN
50.0000 mg | Freq: Once | INTRAMUSCULAR | Status: AC
  Administered 2018-01-05: 50 mg via INTRAVENOUS
  Filled 2018-01-05: qty 1

## 2018-01-05 NOTE — Telephone Encounter (Signed)
-----   Message from [place], RN sent at 01/04/2018  8:51 AM EDT ----- Regarding: B nephrostomy tube exchange scheduled 02/13/2018 I noticed in your last office note you wanted to see this patient around 02/23/2018 to discuss next exchange.  Do you need to see her before the exchange scheduled 11/26?

## 2018-01-05 NOTE — Telephone Encounter (Signed)
Per Dr Erlene Quan, patient does not need to be seen prior to nephrostomy tube exchanges scheduled 02/13/2018 but will need to follow up prior to next exchange being scheduled. LMOM with nephrostomy tube exchange appointment scheduled 02/13/2018 & instructions as well as follow up office visit appointment with Dr Erlene Quan moved to 03/01/2018.

## 2018-01-05 NOTE — Telephone Encounter (Signed)
LMOM with nephrostomy tube exchange appointment & instructions as well as follow up office visit appointment with Dr Erlene Quan moved out.

## 2018-01-05 NOTE — Progress Notes (Signed)
Patient here for follow up. Patient has nephrostomy tubes in place with blood in both. Home health had provided education on the care of tubes. Pt states she has lost over 20 pounds of fluid these past few weeks.

## 2018-01-05 NOTE — Progress Notes (Signed)
Hematology/Oncology Follow Up Note [facility]  [phone] [phone]  Patient Care Team: [name], MD as PCP - General (Internal Medicine)   Name of the patient: Mary [name]  [id number]  04/14/1960   REASON FOR VISIT  follow-up for management of metastatic breast cancer.  Pertinent oncology history # She was diagnosed with right upper quadrant breast in August 2013, status post neoadjuvant chemotherapy with FEC x4 followed by weekly Taxol x2, underwent bilateral mastectomy [February 2014], right ALND, and left sentinel LN biopsy.  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  Tis N0.  Patient also received adjuvant radiation.   Patient was started initially on tamoxifen plus ovarian suppression with Zoladex in June 2014,  and changed to Aromasin plus Zoladex monthly since June 2015.follows up with Dr.Gudina Loleta Dicker at [facility] in [place]. reports that she twisted and sprained her left ankle when getting out of her car on 12/11/2017.  Ankle has been swollen.  #Patient presented to emergency room due to abnormal lab values.  Calcium level was 13.9 with creatinine 1.63.  Also endorses right lower quadrant/right flank/right lower back pain for the past couple of days.  Also having had episodes of dizziness which were diagnosed as vertigo since April 2019.  She had a negative MRI brain done at that time which were essentially negative for acute findings.  In the emergency room CT renal stone study were independently reviewed by me which showed bilateral hydronephrosis, due to the stricture at the level of UPJ's.  Masslike thickening of the anterior and the lateral bladder wall suspicious for metastatic implant.  Mesenteric, omental, peritoneal implants with small ascites and diffuse mesenteric edema.  Small to moderate bilateral pleural effusion  with associated compressive atelectasis of the lung.  Diffuse small osseous skeletal but stated disease.  Patient was seen and evaluated by urology Dr. Erlene Quan.  She is status post bilateral ureter stent placement and infiltrative bladder mass biopsy. Unfortunately her creatinine increased further during the admission and eventually she got bilateral percutaneous nephrostomy tube placed.  Creatinine gradually gets better after hydration. For her hypercalcemia, patient received 1 dose of calcitonin and 1 dose of Zometa.  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 She has visceral crisis with metastatic breast cancer.  In order to avoid further delay outpatient, she was started on first dose of Taxol 80 mg/m on 12/22/2017 after detailed discussion of chemotherapy side effects and complications.  #Left lower extremity DVT, she was anticoagulated with heparin drip.  Switched to Eliquis 5 mg twice daily outpatient. # Patient previously follows up at [facility] center.  She prefers to follow-up with [place] [facility] as it is closer to her home.  INTERVAL HISTORY 57 year old female with newly diagnosed metastatic breast lobular carcinoma presents for assessment prior to chemotherapy treatment for metastatic breast cancer.  During the interval, her nephrostomy tube was leaking and she is status post change of her nephrostomy tube bilaterally. Reports having blood in the urine but urine color getting lighter. #Lower extremity swelling has significantly improved since she started to use Lasix 20 mg.  She used Lasix 20 mg every other day. #Chemotherapy, reports mild nausea manageable with home antiemetics.  No vomiting.  Appetite is good.  Minimal bilateral fingertip neuropathy intermittent.  #  Dizziness has improved.  She is  using scopolamine patch.   Review of Systems  Constitutional: Positive for malaise/fatigue. Negative for chills, fever and weight loss.  HENT: Negative for nosebleeds and sore throat.   Eyes: Negative for double vision, photophobia and redness.  Respiratory: Negative for cough, shortness of breath and wheezing.   Cardiovascular: Positive for leg swelling. Negative for chest pain, palpitations and orthopnea.  Gastrointestinal: Negative for abdominal pain, blood in stool, nausea and vomiting.  Genitourinary: Negative for dysuria.  Musculoskeletal: Negative for back pain, myalgias and neck pain.  Skin: Negative for itching and rash.  Neurological: Negative for dizziness, tingling and tremors.  Endo/Heme/Allergies: Negative for environmental allergies. Does not bruise/bleed easily.  Psychiatric/Behavioral: Negative for depression.      Allergies  Allergen Reactions  . Adhesive [Tape] Other (See Comments)    Redness:Please use paper tape     Past Medical History:  Diagnosis Date  . Anxiety    takes Xanax prn  . Breast cancer [facility]) August 2013   bilateral, er/pr+  . Complication of anesthesia    pt states she had the shakes extremely bad  . Cough    at night d/t reflux;nothing productive  . Dental crowns present    x 4  . GERD (gastroesophageal reflux disease)    takes Nexium daily  . History of bronchitis    08/2011  . History of colon polyps   . Hot flashes, menopausal 08/08/2012  . IBS (irritable bowel syndrome)    immodium prn  . Insomnia    takes Ambien nightly  . Irritable bowel syndrome   . Neutropenia, drug-induced ([place]) 12/15/2011  . Nocturia   . Plantar fasciitis   . S/P radiation therapy 07/03/2012 through 08/18/2038                                                        Right chest wall/regional lymph nodes 5040 cGy 28 sessions, right chest wall/mastectomy scar boost 1000 cGy 5 sessions                          . Status post chemotherapy    FEC 100 starting  on 12/08/2011 - 01/18/12/single agent Taxol x12 weeks from 02/02/2012 through January 2014   . Urinary frequency   . Use of tamoxifen (Nolvadex)   . Vertigo      Past Surgical History:  Procedure Laterality Date  . BREAST LUMPECTOMY  2008   left  . COLONOSCOPY    . COLONOSCOPY WITH PROPOFOL N/A 10/10/2017   Procedure: COLONOSCOPY WITH PROPOFOL;  Surgeon: Lucilla Lame, MD;  Location: [facility] ENDOSCOPY;  Service: Endoscopy;  Laterality: N/A;  . CYSTOSCOPY WITH STENT PLACEMENT Bilateral 12/17/2017   Procedure: CYSTOSCOPY WITH STENT PLACEMENT;  Surgeon: Hollice Espy, MD;  Location: [facility] ORS;  Service: Urology;  Laterality: Bilateral;  . IR NEPHROSTOMY EXCHANGE RIGHT  01/02/2018  . IR NEPHROSTOMY PLACEMENT LEFT  12/19/2017  . IR NEPHROSTOMY PLACEMENT RIGHT  12/19/2017  . MASTECTOMY    . MASTECTOMY WITH AXILLARY LYMPH NODE DISSECTION Right 05/18/2012   Procedure: MASTECTOMY WITH AXILLARY LYMPH NODE DISSECTION;  Surgeon: Adin Hector, MD;  Location: [place];  Service: General;  Laterality: Right;  Bilateral Total Mastectomy , right axillary lymph node dissection left axillary sentinel node biopsy,  removal of Porta Cath  . PORT-A-CATH REMOVAL N/A 05/18/2012   Procedure: REMOVAL PORT-A-CATH;  Surgeon: Adin Hector, MD;  Location: [place];  Service: General;  Laterality: N/A;  . PORTA CATH INSERTION N/A 12/26/2017   Procedure: PORTA CATH INSERTION;  Surgeon: Katha Cabal, MD;  Location: [place] [facility];  Service: Cardiovascular;  Laterality: N/A;  . PORTACATH PLACEMENT  12/02/2011   Procedure: INSERTION PORT-A-CATH;  Surgeon: Adin Hector, MD;  Location: [place];  Service: General;  Laterality: N/A;  insertion port a cath with fluoroscopy  . SIMPLE MASTECTOMY WITH AXILLARY SENTINEL NODE BIOPSY Left 05/18/2012   Procedure: SIMPLE MASTECTOMY WITH AXILLARY SENTINEL NODE BIOPSY;  Surgeon: Adin Hector, MD;  Location: [place];  Service: General;  Laterality: Left;     Social History   Socioeconomic History  . Marital status: Married    Spouse name: Not on file  . Number of children: 0  . Years of education: Not on file  . Highest education level: Not on file  Occupational History  . Occupation: Network engineer II    Employer: [employer]  Social Needs  . Financial resource strain: Not on file  . Food insecurity:    Worry: Not on file    Inability: Not on file  . Transportation needs:    Medical: Not on file    Non-medical: Not on file  Tobacco Use  . Smoking status: Former Smoker    Packs/day: 0.50    Years: 15.00    Pack years: 7.50    Types: Cigarettes    Last attempt to quit: 03/21/1997    Years since quitting: 20.8  . Smokeless tobacco: Never Used  Substance and Sexual Activity  . Alcohol use: Not Currently    Alcohol/week: 0.0 standard drinks    Comment: occasionally wine  . Drug use: No  . Sexual activity: Yes    Birth control/protection: None  Lifestyle  . Physical activity:    Days per week: Not on file    Minutes per session: Not on file  . Stress: Not on file  Relationships  . Social connections:    Talks on phone: Not on file    Gets together: Not on file    Attends religious service: Not on file    Active member of club or organization: Not on file    Attends meetings of clubs or organizations: Not on file    Relationship status: Not on file  . Intimate partner violence:    Fear of current or ex partner: Not on file    Emotionally abused: Not on file    Physically abused: Not on file    Forced sexual activity: Not on file  Other Topics Concern  . Not on file  Social History Narrative   Married to [employer]  No children 55 years old with fmp.  20 year use of birth control pills.      Family History  Problem Relation Age of Onset  . Hypertension Mother   . Diabetes Mother   . Hypertension Maternal Grandmother   . Pancreatic cancer Paternal Grandmother        diagnosed in her 52s  . Ovarian cancer  Maternal Aunt        maternal grandmother's sister  . Breast cancer Paternal Aunt        diagnosed in late 44s early 39s  . Parkinsonism Paternal Grandfather      Current Outpatient Medications:  .  amLODipine (NORVASC) 5 MG tablet, TAKE 1 TABLET BY MOUTH DAILY., Disp: 90 tablet, Rfl: 1 .  apixaban (ELIQUIS) 5 MG TABS tablet, Two tablets twice a day for seven days then one tablet twice a day afterwards, Disp: 74 tablet, Rfl: 0 .  dexamethasone (DECADRON) 4 MG tablet, Take 1 tablet (4 mg total) by mouth See admin instructions. You may take 4 mg daily for 2 days after chemotherapy if needed for nausea, Disp: 30 tablet, Rfl: 0 .  diazepam (VALIUM) 10 MG tablet, Take 1 tablet (10 mg total) by mouth at bedtime as needed for anxiety., Disp: 30 tablet, Rfl: 5 .  esomeprazole (NEXIUM) 40 MG capsule, Take 40 mg by mouth daily before breakfast., Disp: , Rfl:  .  furosemide (LASIX) 20 MG tablet, Take 1 tablet (20 mg total) by mouth daily as needed., Disp: 10 tablet, Rfl: 0 .  lidocaine-prilocaine (EMLA) cream, Apply to affected area once, Disp: 30 g, Rfl: 3 .  loperamide (IMODIUM A-D) 2 MG tablet, Take 2 mg by mouth 4 (four) times daily as needed for diarrhea or loose stools. , Disp: , Rfl:  .  Multiple Vitamin (MULTIVITAMIN) tablet, Take 1 tablet by mouth daily.  , Disp: , Rfl:  .  ondansetron (ZOFRAN) 4 MG tablet, Take 1 tablet (4 mg total) by mouth every 8 (eight) hours as needed for nausea or vomiting., Disp: 20 tablet, Rfl: 0 .  oxybutynin (DITROPAN) 5 MG tablet, Take 1 tablet (5 mg total) by mouth every 8 (eight) hours as needed for bladder spasms., Disp: 20 tablet, Rfl: 0 .  potassium chloride SA (K-DUR,KLOR-CON) 20 MEQ tablet, Take 1 tablet (20 mEq total) by mouth daily., Disp: 30 tablet, Rfl: 0 .  prochlorperazine (COMPAZINE) 10 MG tablet, Take 1 tablet (10 mg total) by mouth every 6 (six) hours as needed (Nausea or vomiting)., Disp: 30 tablet, Rfl: 1 .  scopolamine (TRANSDERM-SCOP) 1 MG/3DAYS,  Place 1 patch (1.5 mg total) onto the skin every 3 (three) days., Disp: 10 patch, Rfl: 0 .  traMADol (ULTRAM) 50 MG tablet, Take 1 tablet (50 mg total) by mouth every 6 (six) hours as needed., Disp: 60 tablet, Rfl: 0 .  traZODone (DESYREL) 50 MG tablet, Take 0.5-1 tablets (25-50 mg total) by mouth at bedtime as needed for sleep., Disp: 90 tablet, Rfl: 1 .  Ascorbic Acid (VITAMIN C) 100 MG tablet, Take 100 mg by mouth daily.  , Disp: , Rfl:  .  Calcium Carbonate-Vitamin D (CALTRATE 600+D PO), Take 1 tablet by mouth daily., Disp: , Rfl:  .  ferrous sulfate 325 (65 FE) MG EC tablet, Take 1 tablet (325 mg total) by mouth daily., Disp: 30 tablet, Rfl: 3  Physical exam: ECOG 2 Vitals:   01/05/18 0848  BP: 114/75  Pulse: 92  Resp: 18  Temp: (!) 97.1 F (36.2 C)  TempSrc: Tympanic  Weight: 154 lb 11.2 oz (70.2 kg)   Physical Exam  Constitutional: She is oriented to person, place, and time. No distress.  HENT:  Head: Normocephalic and atraumatic.  Mouth/Throat: Oropharynx is clear and moist.  Eyes: Pupils are equal, round, and reactive to light. EOM are normal. No scleral icterus.  Neck: Normal range of motion. Neck supple.  Cardiovascular: Normal rate, regular rhythm and normal heart sounds.  Pulmonary/Chest: Effort normal. No respiratory distress. She has no wheezes.  Decreased breath sounds at right lower base, improved.  Abdominal: Soft. Bowel sounds are normal. She exhibits no distension and no mass. There is  no tenderness.  Genitourinary:  Genitourinary Comments: Bilateral percutaneous nephrostomy tube, bloodstained urine in the nephrostomy bag.  Musculoskeletal: Normal range of motion. She exhibits edema. She exhibits no deformity.  Bilateral 1+ pitting edema  Neurological: She is alert and oriented to person, place, and time. No cranial nerve deficit. Coordination normal.  Skin: Skin is warm and dry. No rash noted. No erythema.  Psychiatric: She has a normal mood and affect. Her  behavior is normal. Thought content normal.    CMP Latest Ref Rng & Units 01/05/2018  Glucose 70 - 99 mg/dL 123(H)  BUN 6 - 20 mg/dL 9  Creatinine 0.44 - 1.00 mg/dL 0.93  Sodium 135 - 145 mmol/L 140  Potassium 3.5 - 5.1 mmol/L 4.1  Chloride 98 - 111 mmol/L 107  CO2 22 - 32 mmol/L 25  Calcium 8.9 - 10.3 mg/dL 7.8(L)  Total Protein 6.5 - 8.1 g/dL 6.4(L)  Total Bilirubin 0.3 - 1.2 mg/dL 0.4  Alkaline Phos 38 - 126 U/L 152(H)  AST 15 - 41 U/L 67(H)  ALT 0 - 44 U/L 68(H)   CBC Latest Ref Rng & Units 01/05/2018  WBC 4.0 - 10.5 K/uL 3.6(L)  Hemoglobin 12.0 - 15.0 g/dL 9.6(L)  Hematocrit 36.0 - 46.0 % 29.2(L)  Platelets 150 - 400 K/uL 347    Dg Chest 2 View  Result Date: 12/20/2017 CLINICAL DATA:  Shortness of breath. Weakness. EXAM: CHEST - 2 VIEW COMPARISON:  12/02/2011 FINDINGS: The cardiac silhouette is normal in size. Left chest Port-A-Cath has been removed. Surgical clips are present in the axillae. There are new moderate right and small left pleural effusions with atelectasis or consolidation in both lung bases, particularly medially. No pneumothorax is identified. No acute osseous abnormality is seen. IMPRESSION: New moderate right and small left pleural effusions with bibasilar atelectasis or consolidation. Electronically Signed   By: Logan Bores M.D.   On: 12/20/2017 13:43   Mr Jeri Cos HR Contrast  Result Date: 01/03/2018 CLINICAL DATA:  57 year old female with history of metastatic breast cancer. Dizziness. EXAM: MRI HEAD WITHOUT AND WITH CONTRAST TECHNIQUE: Multiplanar, multiecho pulse sequences of the brain and surrounding structures were obtained without and with intravenous contrast. CONTRAST:  88mL MULTIHANCE GADOBENATE DIMEGLUMINE 529 MG/ML IV SOLN COMPARISON:  Brain MRI and intracranial MRA 08/10/2017 FINDINGS: Brain: No abnormal enhancement identified (developmental venous anomaly of the cerebellar vermis, see below). No midline shift, mass effect, or evidence of  intracranial mass lesion. No dural thickening. Stable cerebral volume. Chronic partially empty sella. Stable ventricular prominence. No transependymal edema. No restricted diffusion to suggest acute infarction. No extra-axial collection or acute intracranial hemorrhage. Cervicomedullary junction within normal limits. 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 No other chronic cerebral blood products. Vascular: Major intracranial vascular flow voids are stable. The major dural venous sinuses are enhancing and appear patent. Skull and upper cervical spine: Heterogeneous loss of the normal T1 bone marrow signal in the skull and cervical spine since 2018. No destructive osseous lesion is identified. Negative visible cervical spinal cord. Sinuses/Orbits: Stable and negative. Other: Mastoid air cells are clear. Visible internal auditory structures appear normal. Scalp and face soft tissues appear negative. IMPRESSION: 1. Heterogeneous decreased bone marrow signal since 2018. This could be osseous metastatic disease, but red marrow reactivation such as in the setting of chemotherapy  could also have this appearance. 2. Stable MRI appearance of the brain with no cerebral metastatic disease or acute intracranial abnormality identified. 3. 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 Electronically Signed   By: Genevie Ann M.D.   On: 01/03/2018 17:14   US Renal  Result Date: 12/18/2017 CLINICAL DATA:  Hydronephrosis aunt LOWER abdominal pain for 2-3 months. History of cystoscopy with bilateral stent placement 12/17/2017. EXAM: RENAL / URINARY TRACT ULTRASOUND COMPLETE COMPARISON:  CT of the abdomen and pelvis on 12/16/2017 FINDINGS: Right Kidney: Length: 12.1 centimeters. There is  moderate hydronephrosis. Stent is visualized within the RIGHT renal pelvis. No solid or cystic renal mass. Left Kidney: Length: 12.7 centimeter. There is moderate hydronephrosis. The ureteral stent is not well seen in the LEFT renal pelvis. Bladder: Bladder is decompressed by a Foley catheter. Additional: There are bilateral pleural effusions.  Ascites. IMPRESSION: 1. Moderate bilateral hydronephrosis. 2. Foley catheter decompresses the bladder. 3. Bilateral pleural effusions.  Ascites. Electronically Signed   By: Nolon Nations M.D.   On: 12/18/2017 16:36   US Venous Img Lower Unilateral Left  Result Date: 12/16/2017 CLINICAL DATA:  Lower extremity edema EXAM: LEFT LOWER EXTREMITY VENOUS DUPLEX ULTRASOUND TECHNIQUE: Doppler venous assessment of the left lower extremity deep venous system was performed, including characterization of spectral flow, compressibility, and phasicity. COMPARISON:  None. FINDINGS: There is complete compressibility of the left common femoral and femoral veins. The left popliteal vein is partially occlusive and contains partially occlusive DVT. Thrombus is mixed echogenicity with areas of hypoechoic and hyperechoic signal. There is occlusive thrombus in the posterior tibial and peroneal veins. Doppler analysis demonstrates respiratory phasicity and augmentation. IMPRESSION: The study is positive for DVT in the popliteal, posterior tibial, and peroneal veins as described. Critical Value/emergent results were called by telephone at the time of interpretation on 12/16/2017 at 9:24 am to Dr. Alfred Levins, who verbally acknowledged these results. Electronically Signed   By: Marybelle Killings M.D.   On: 12/16/2017 09:25   Ct Renal Stone Study  Result Date: 12/16/2017 CLINICAL DATA:  57 year old female with abdominal pain. History of breast cancer and bilateral mastectomy. Patient is on chemo and radiation treatment. EXAM: CT ABDOMEN AND PELVIS WITHOUT CONTRAST TECHNIQUE: Multidetector CT imaging  of the abdomen and pelvis was performed following the standard protocol without IV contrast. COMPARISON:  PET-CT dated 12/01/2011 FINDINGS: Evaluation of this exam is limited in the absence of intravenous contrast. Lower chest: Partially visualized small to moderate bilateral pleural effusions with associated partial compressive atelectasis of the adjacent lungs and areas of linear atelectasis/scarring. No intra-abdominal free air. Diffuse mesenteric edema and stranding and small ascites. Hepatobiliary: Subcentimeter hypodense focus in the right lobe of the liver is not characterized. The liver is otherwise unremarkable on this noncontrast CT. Probable tiny stone or sludge within the gallbladder. Pancreas: Unremarkable. No pancreatic ductal dilatation or surrounding inflammatory changes. Spleen: Normal in size without focal abnormality. Adrenals/Urinary Tract: Mild bilateral adrenal thickening. There is moderate bilateral hydronephrosis. The ureters are nondistended. The hydronephrosis is likely related to a degree of stricture at the ureteropelvic junctions secondary to retroperitoneal adhesions/fibrosis. No stone identified within the kidneys or along the course of the ureters. There is masslike thickening of the anterior and left lateral bladder wall likely metastatic disease. Stomach/Bowel: There is diffuse thickening of the stomach which may be partly related to underdistention and ascites. Gastritis or an infiltrative process is not entirely excluded. There is no bowel obstruction. The appendix is normal  as visualized. Vascular/Lymphatic: Mild aortoiliac atherosclerotic disease. The abdominal aorta and IVC are grossly unremarkable on this noncontrast CT. No portal venous gas. There are multiple somewhat small retroperitoneal and para-aortic lymph nodes. A mildly enlarged right iliac chain node measures 11 mm in short axis. There is diffuse mesenteric and omental nodularity and edema consistent with metastatic  implants. Multiple top-normal and mildly rounded mesenteric lymph nodes measure up to 8 mm in short axis. Reproductive: Evaluation of the pelvic structures is very limited. The uterus appears retroflexed. A 3.3 x 3.9 x 2.6 cm ill-defined high attenuating solid lesion to the right of the uterine fundus is not well evaluated and may represent a fibroid or a complex lesion arising from the right adnexa. However, a neoplastic mass is not excluded. Pelvic ultrasound may provide better evaluation. Other: There is mild diffuse subcutaneous edema of the abdominal wall. Musculoskeletal: Multiple small scattered sclerotic lesions throughout the spine most consistent with metastatic disease. No acute fracture. IMPRESSION: 1. Moderate bilateral hydronephrosis, likely related to strictures at the level of the UPJ's, and likely secondary to retroperitoneal adhesions or fibrosis. No obstructing stone. 2. Masslike thickening of the anterior and left lateral bladder wall suspicious for metastatic implant. 3. Mesenteric, omental, and peritoneal implants with a small ascites and diffuse mesenteric edema. 4. Small to moderate bilateral pleural effusions with associated compressive atelectasis of the lungs. 5. Diffuse small osseous sclerotic metastatic disease. Electronically Signed   By: Anner Crete M.D.   On: 12/16/2017 06:53   Ir Nephrostomy Placement Left  Result Date: 12/20/2017 INDICATION: Bilateral ureteral obstruction with occluded ureteral stents EXAM: IR NEPHROSTOMY PLACEMENT LEFT COMPARISON:  None. MEDICATIONS: 2 g IV Ancef; The antibiotic was administered in an appropriate time frame prior to skin puncture. ANESTHESIA/SEDATION: Fentanyl 75 mcg IV; Versed 1 mg IV Moderate Sedation Time:  35 The patient was continuously monitored during the procedure by the interventional radiology nurse under my direct supervision. CONTRAST:  51mL ISOVUE-300 IOPAMIDOL (ISOVUE-300) INJECTION 61% - administered into the collecting  system(s) FLUOROSCOPY TIME:  Fluoroscopy Time: 5 minutes 48 seconds (356 mGy). COMPLICATIONS: None immediate. PROCEDURE: Informed written consent was obtained from the patient after a thorough discussion of the procedural risks, benefits and alternatives. All questions were addressed. Maximal Sterile Barrier Technique was utilized including caps, mask, sterile gowns, sterile gloves, sterile drape, hand hygiene and skin antiseptic. A timeout was performed prior to the initiation of the procedure. Utilizing 1% lidocaine as a local and deep anesthetic and fluoroscopic guidance, a 21 gauge needle was utilized to puncture the collecting system in the region of the left renal pelvis guided by a previously placed stent. Brown colored urine likely related to prior hemorrhage was withdrawn from the needle and contrast injection confirmed location within the collecting system. Subsequently a small amount of room air was injected to allow for delineation of posterior caliceal system. A lower pole posterior calyx was identified and again utilizing 1% xylocaine as local anesthetic and fluoroscopic guidance, a 20 gauge diamond tip needle was passed into the posterior calyx. Both air and urine were withdrawn from the needle and subsequently a 0.018 platinum tip guidewire was advanced into the collecting system. Over this, a 6 [place] Accustick dilator was placed into the collecting system and a J wire coiled within the renal pelvis. The axis sick dilator was then removed and the tract dilated to 8 [place]. The dilator was then removed and a 8 French pigtail catheter was placed over the wire and coiled within the  renal pelvis. The catheter was initially placed adjacent to the body of the stent although no significant drainage of urine was noted. Contrast material was injected which demonstrates a significant amount of thrombus within the collecting system and for this reason the catheter was advanced into the superior aspect of the  renal pelvis where adequate urine drainage was noted. The catheter was then sutured into place utilizing 0 Prolene and a Hopkins buttress. Catheter was placed external gravity drain and bloody urine was obtained. The patient tolerated the catheter placement well and attention was then turned to the right collecting system. IMPRESSION: Successful placement of an 8 French pigtail catheter within the left renal collecting system. Thrombus is noted within the collecting system which may limit drainage somewhat. This should resolve over time. Electronically Signed   By: Inez Catalina M.D.   On: 12/20/2017 08:02   Ir Nephrostomy Placement Right  Result Date: 12/20/2017 INDICATION: Ureteral occlusion with occluded ureteral stents EXAM: IR NEPHROSTOMY PLACEMENT RIGHT COMPARISON:  None. MEDICATIONS: 2 g IV Ancef; The antibiotic was administered in an appropriate time frame prior to skin puncture. ANESTHESIA/SEDATION: Fentanyl 75 mcg IV; Versed 1 mg IV Moderate Sedation Time:  35 The patient was continuously monitored during the procedure by the interventional radiology nurse under my direct supervision. CONTRAST:  33mL ISOVUE-300 IOPAMIDOL (ISOVUE-300) INJECTION 61% - administered into the collecting system(s) FLUOROSCOPY TIME:  Fluoroscopy Time: 5 minutes 48 seconds (356 mGy). COMPLICATIONS: None immediate. PROCEDURE: Informed written consent was obtained from the patient after a thorough discussion of the procedural risks, benefits and alternatives. All questions were addressed. Maximal Sterile Barrier Technique was utilized including caps, mask, sterile gowns, sterile gloves, sterile drape, hand hygiene and skin antiseptic. A timeout was performed prior to the initiation of the procedure. Utilizing 1% xylocaine as local anesthetic and fluoroscopic guidance a 21 gauge Chiba needle was placed into the right collecting system utilizing the pigtail from the previously placed ureteral stent as a guide. Brown tinged urine  was withdrawn from the needle and subsequently a small amount of contrast was instilled to confirm needle placement. This demonstrates dilated collecting system. Subsequently a small amount of room air was instilled to allow for delineation of a posterior calyx. Again utilizing 1% xylocaine as local anesthetic and fluoroscopic guidance, a 20 gauge diamond tip needle was placed into the upper pole posterior calyx without difficulty. A 0.018 platinum tip guidewire was then advanced into the collecting system and coiled within the renal pelvis. A 6 French Accustick dilator was then placed over the microwire into the collecting system. The central dilator in stiff near were then removed and a J tipped guidewire was coiled within the renal pelvis. Dilatation to 10 [place] was then performed and a 10 French nephrostomy catheter placed over the guidewire into the renal pelvis. Brisk return of bloody urine was then noted. The catheter was then attached to an external gravity bag. The catheter was sutured into place utilizing 0 Prolene and a Hopkins buttress. Patient tolerated the procedure well and was returned to her room in satisfactory condition. IMPRESSION: Successful right percutaneous nephrostomy placement as described above. Bloody urine was noted at the termination of the procedure which should clear over time. Electronically Signed   By: Inez Catalina M.D.   On: 12/20/2017 08:06   Ir Nephrostomy Exchange Right  Result Date: 01/02/2018 INDICATION: Leaking right-sided nephrostomy catheter. Patient with history of metastatic breast cancer with obstructive bilateral hydronephrosis, failed placement of bilateral internal ureteral stents, for which  patient underwent bilateral image guided nephrostomy catheter placement on 12/19/2017. Patient now returns to the Interventional Radiology department with complaint of leakage surrounding the right-sided percutaneous nephrostomy catheter. The left-sided nephrostomy catheter  is functioning well without leakage. EXAM: FLUOROSCOPIC GUIDED RIGHT SIDED NEPHROSTOMY CATHETER EXCHANGE COMPARISON:  Image guided bilateral nephrostomy catheter placement-12/19/2017; CT abdomen and pelvis-12/16/2017 CONTRAST:  15 mL Isovue-300 administered into the right renal collecting system. FLUOROSCOPY TIME:  24 seconds (5 mGy) COMPLICATIONS: None immediate. TECHNIQUE: Informed written consent was obtained from the patient after a discussion of the risks, benefits and alternatives to treatment. Questions regarding the procedure were encouraged and answered. A timeout was performed prior to the initiation of the procedure. The right flank and external portion of existing nephrostomy catheter were prepped and draped in the usual sterile fashion. A sterile drape was applied covering the operative field. Maximum barrier sterile technique with sterile gowns and gloves were used for the procedure. A timeout was performed prior to the initiation of the procedure. A pre procedural spot fluoroscopic image was obtained after contrast was injected via the existing nephrostomy catheter demonstrating appropriate positioning within the renal pelvis. The existing nephrostomy catheter was cut and cannulated with a Benson wire which was coiled within the renal pelvis. Under intermittent fluoroscopic guidance, the existing nephrostomy catheter was exchanged for a new 10.2 [place] all-purpose drainage catheter. Contrast injection confirmed appropriate positioning within the renal pelvis and a post exchange fluoroscopic image was obtained. The catheter was locked and secured to the skin with a suture. A dressing was placed. The patient tolerated the procedure well without immediate postprocedural complication. FINDINGS: Preprocedural spot fluoroscopic image demonstrates unchanged positioning of right-sided nephrostomy catheter with and coiled and locked overlying expected location right renal pelvis. There is slight resistance to  contrast injection of the right-sided nephrostomy catheter however ultimately there was opacification of the right renal collecting system. Note is made nonocclusive filling defect within the central aspect of the right renal pelvis compatible with a blood clot. After successful fluoroscopic guided exchange, the new nephrostomy catheter is coiled and locked within the right renal pelvis. IMPRESSION: Successful fluoroscopic guided exchange of right sided 10.2 French percutaneous nephrostomy catheter. PLAN: - The etiology of the patient's right-sided nephrostomy catheter leakage is favored to be secondary to occlusion of the nephrostomy tube due to blood within the right renal collecting system likely at least partially attributable to the patient's anticoagulated state (patient is on Xarelto for lower extremity DVT). As such, patient was instructed to flush both nephrostomy catheters twice a day with 5-10 cc of saline to ensure patency of both nephrostomy catheters. - routine fluoroscopic guided exchange will be scheduled 2 months following placement (early December). Consideration for conversion of the patient's bilateral nephrostomy catheters to bilateral nephroureteral catheters could be considered at that time as indicated. Electronically Signed   By: Sandi Mariscal M.D.   On: 01/02/2018 12:56     Assessment and plan Patient is a 57 y.o. female with metastatic breast cancer, visceral crisis present for follow-up prior to chemotherapy treatment. 1. Encounter for antineoplastic chemotherapy   2. Generalized edema   3. Anemia, unspecified type   4. Metastatic breast cancer ([place])   5. Acute deep vein thrombosis (DVT) of proximal vein of left lower extremity (H[facility])   6. Nephrostomy status ([place])   7. Hypercalcemia    #Metastatic breast cancer,  Tolerate cycle 1 weekly Taxol.  Labs reviewed and discussed with patient.  Counts acceptable to proceed with today's Taxol treatment.  Next week she is off  chemotherapy.  Follow-up on 01/19/2018 for assessment prior to cycle 2 Taxotere treatment.  #Generalized edema, improved with use of Lasix 20 mg every other day as needed. Continue Lasix 20 mg daily as needed.  I refilled her Lasix medication today. #Chronic dizziness, vertigo versus CNS involvement. MRI brain was independently reviewed by me and discussed with patient. MRI brain showed stable appearance of the brain with no cerebral metastasis or acute intracranial abnormality. Heterogeneous decreased bone marrow signal can be reflecting osseous metastatic disease but red marrow reactivation such as the setting of chemotherapy could also have this appearance. 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  I discussed with patient that MRI did not show obvious intracranial abnormality.  However lobular breast cancer can have leptomeningeal infiltration.  Will consider spinal tap after a few cycles of chemotherapy before we switch her to endocrine treatments.   I recommend her to use every other day but if she feels that she has increased edema, she may use it daily.  #Lower extremity DVT in metastatic cancer setting.  Continue with Eliquis 5 mg twice daily.  Duration of anticoagulation is long-term.   # Anemia, worsened. Add iron panel. Combination of chronic blood loss and chemotherapy. Advise patient to start taking ferrous sulfate 325mg  daily.   #Kidney function has improved back to baseline.  Creatinine 0.93 today. #Hypercalcemia, calcium level 7.8.  Stable. #Transaminitis, likely secondary to recent chemotherapy with Taxol.  Continue monitor.  Normal bilirubin. #Acute DVT of lower extremity, continue Eliquis 5 mg twice daily.  Follow-up in 2 weeks We spent sufficient time to discuss many aspect of care, questions were answered to patient's satisfaction.  Earlie Server, MD, PhD Hematology Oncology [facility] Pager-  [phone] 01/05/2018

## 2018-01-09 ENCOUNTER — Telehealth: Payer: Self-pay | Admitting: *Deleted

## 2018-01-09 ENCOUNTER — Other Ambulatory Visit: Payer: Self-pay | Admitting: Internal Medicine

## 2018-01-09 ENCOUNTER — Telehealth: Payer: Self-pay | Admitting: Internal Medicine

## 2018-01-09 DIAGNOSIS — Z436 Encounter for attention to other artificial openings of urinary tract: Secondary | ICD-10-CM | POA: Diagnosis not present

## 2018-01-09 DIAGNOSIS — C799 Secondary malignant neoplasm of unspecified site: Secondary | ICD-10-CM | POA: Diagnosis not present

## 2018-01-09 DIAGNOSIS — C50919 Malignant neoplasm of unspecified site of unspecified female breast: Secondary | ICD-10-CM | POA: Diagnosis not present

## 2018-01-09 DIAGNOSIS — Z936 Other artificial openings of urinary tract status: Secondary | ICD-10-CM | POA: Diagnosis not present

## 2018-01-09 DIAGNOSIS — Z7901 Long term (current) use of anticoagulants: Secondary | ICD-10-CM | POA: Diagnosis not present

## 2018-01-09 MED ORDER — SULFAMETHOXAZOLE-TRIMETHOPRIM 800-160 MG PO TABS: 1.0000 | ORAL_TABLET | Freq: Two times a day (BID) | ORAL | 0 refills | Status: DC

## 2018-01-09 NOTE — Telephone Encounter (Signed)
Pt's husband called re: fever 102; no localizing symptoms. Recommend calling home health for eval; and page/call me again.

## 2018-01-09 NOTE — Progress Notes (Signed)
Spoke to home health RN; pt does not appear toxic [fever 102] recent chemo.  Recommend bactrim DS BID x3 days.

## 2018-01-09 NOTE — Telephone Encounter (Signed)
Amy with [facility] called asking for approval of orders to see patient weekly times five and and also that she needs prescription sent to [facility] for flushes for the nephrostomy tube faxed to [phone]. Please advise if you approved plan of Care and please faxed orders to them for flushing tubes TODAY

## 2018-01-09 NOTE — Telephone Encounter (Signed)
Call returned to Mary [name] and VO for services are given on her voice mail. Requested that she send an order for doctor to sign regarding exactly what she needs for the flushing and the fax number for pod 2 given

## 2018-01-10 ENCOUNTER — Telehealth: Payer: Self-pay | Admitting: *Deleted

## 2018-01-10 NOTE — Telephone Encounter (Signed)
Called pt, spoke with husband, Mary [name]... Offered pt to come in clinic tomorrow for labs and evaluation, appt accepted and scheduled.

## 2018-01-10 NOTE — Telephone Encounter (Signed)
I called to follow up on patient condition. Husband reports that temp this morning was 99. He checked it while I was on the phone and it is 100.7 now. She started antibiotics this morning Bactrim DS which Dr Rogue Bussing ordered last evening for her. I advised he give her Tylenol for her fever and to call back if temp continues to rise 102 or above. He states he gave her  Tylenol this morning @ 9:30, but will give her another now (he is using 500 mg tabs and giving her 1 tablet)

## 2018-01-11 ENCOUNTER — Inpatient Hospital Stay (HOSPITAL_BASED_OUTPATIENT_CLINIC_OR_DEPARTMENT_OTHER): Payer: 59 | Admitting: Oncology

## 2018-01-11 ENCOUNTER — Other Ambulatory Visit: Payer: Self-pay

## 2018-01-11 ENCOUNTER — Encounter: Payer: Self-pay | Admitting: Oncology

## 2018-01-11 ENCOUNTER — Inpatient Hospital Stay: Payer: 59

## 2018-01-11 VITALS — BP 103/68 | HR 96 | Temp 99.3°F | Resp 18

## 2018-01-11 DIAGNOSIS — F5089 Other specified eating disorder: Secondary | ICD-10-CM

## 2018-01-11 DIAGNOSIS — Z936 Other artificial openings of urinary tract status: Secondary | ICD-10-CM

## 2018-01-11 DIAGNOSIS — R509 Fever, unspecified: Secondary | ICD-10-CM

## 2018-01-11 DIAGNOSIS — C50919 Malignant neoplasm of unspecified site of unspecified female breast: Secondary | ICD-10-CM

## 2018-01-11 DIAGNOSIS — R630 Anorexia: Secondary | ICD-10-CM

## 2018-01-11 DIAGNOSIS — Z436 Encounter for attention to other artificial openings of urinary tract: Secondary | ICD-10-CM | POA: Diagnosis not present

## 2018-01-11 DIAGNOSIS — D6489 Other specified anemias: Secondary | ICD-10-CM | POA: Diagnosis not present

## 2018-01-11 DIAGNOSIS — I824Y2 Acute embolism and thrombosis of unspecified deep veins of left proximal lower extremity: Secondary | ICD-10-CM

## 2018-01-11 DIAGNOSIS — C7911 Secondary malignant neoplasm of bladder: Secondary | ICD-10-CM | POA: Diagnosis not present

## 2018-01-11 DIAGNOSIS — I82459 Acute embolism and thrombosis of unspecified peroneal vein: Secondary | ICD-10-CM | POA: Diagnosis not present

## 2018-01-11 DIAGNOSIS — C50412 Malignant neoplasm of upper-outer quadrant of left female breast: Secondary | ICD-10-CM

## 2018-01-11 DIAGNOSIS — N179 Acute kidney failure, unspecified: Secondary | ICD-10-CM | POA: Diagnosis not present

## 2018-01-11 DIAGNOSIS — Z7901 Long term (current) use of anticoagulants: Secondary | ICD-10-CM

## 2018-01-11 DIAGNOSIS — C50411 Malignant neoplasm of upper-outer quadrant of right female breast: Secondary | ICD-10-CM | POA: Diagnosis not present

## 2018-01-11 DIAGNOSIS — Z5111 Encounter for antineoplastic chemotherapy: Secondary | ICD-10-CM | POA: Diagnosis not present

## 2018-01-11 DIAGNOSIS — C799 Secondary malignant neoplasm of unspecified site: Secondary | ICD-10-CM | POA: Diagnosis not present

## 2018-01-11 DIAGNOSIS — R740 Nonspecific elevation of levels of transaminase and lactic acid dehydrogenase [LDH]: Secondary | ICD-10-CM | POA: Diagnosis not present

## 2018-01-11 DIAGNOSIS — R601 Generalized edema: Secondary | ICD-10-CM | POA: Diagnosis not present

## 2018-01-11 DIAGNOSIS — R42 Dizziness and giddiness: Secondary | ICD-10-CM | POA: Diagnosis not present

## 2018-01-11 LAB — COMPREHENSIVE METABOLIC PANEL
ALK PHOS: 151 U/L — AB (ref 38–126)
ALT: 45 U/L — AB (ref 0–44)
ANION GAP: 9 (ref 5–15)
AST: 38 U/L (ref 15–41)
Albumin: 3.3 g/dL — ABNORMAL LOW (ref 3.5–5.0)
BILIRUBIN TOTAL: 0.5 mg/dL (ref 0.3–1.2)
BUN: 18 mg/dL (ref 6–20)
CALCIUM: 8.3 mg/dL — AB (ref 8.9–10.3)
CO2: 22 mmol/L (ref 22–32)
CREATININE: 1.04 mg/dL — AB (ref 0.44–1.00)
Chloride: 104 mmol/L (ref 98–111)
GFR calc non Af Amer: 59 mL/min — ABNORMAL LOW (ref >60)
Glucose, Bld: 125 mg/dL — ABNORMAL HIGH (ref 70–99)
Potassium: 4 mmol/L (ref 3.5–5.1)
Sodium: 135 mmol/L (ref 135–145)
TOTAL PROTEIN: 7.4 g/dL (ref 6.5–8.1)

## 2018-01-11 LAB — CBC WITH DIFFERENTIAL/PLATELET
Abs Immature Granulocytes: 0.05 10*3/uL (ref 0.00–0.07)
BASOS PCT: 1 %
Basophils Absolute: 0 10*3/uL (ref 0.0–0.1)
EOS ABS: 0 10*3/uL (ref 0.0–0.5)
EOS PCT: 0 %
HEMATOCRIT: 30.5 % — AB (ref 36.0–46.0)
Hemoglobin: 9.6 g/dL — ABNORMAL LOW (ref 12.0–15.0)
IMMATURE GRANULOCYTES: 1 %
LYMPHS ABS: 1.1 10*3/uL (ref 0.7–4.0)
Lymphocytes Relative: 18 %
MCH: 29.8 pg (ref 26.0–34.0)
MCHC: 31.5 g/dL (ref 30.0–36.0)
MCV: 94.7 fL (ref 80.0–100.0)
Monocytes Absolute: 0.6 10*3/uL (ref 0.1–1.0)
Monocytes Relative: 9 %
NEUTROS PCT: 71 %
Neutro Abs: 4.3 10*3/uL (ref 1.7–7.7)
PLATELETS: 361 10*3/uL (ref 150–400)
RBC: 3.22 MIL/uL — ABNORMAL LOW (ref 3.87–5.11)
RDW: 14.4 % (ref 11.5–15.5)
WBC: 6 10*3/uL (ref 4.0–10.5)
nRBC: 0 % (ref 0.0–0.2)

## 2018-01-11 NOTE — Progress Notes (Signed)
Patient here today for an acute visit.  Patient started with fever two days ago.  Patient took 500mg  tylenol this morning at approx 8:30.  Patient has no other symptoms.

## 2018-01-14 ENCOUNTER — Encounter: Payer: Self-pay | Admitting: Oncology

## 2018-01-14 DIAGNOSIS — Z7189 Other specified counseling: Secondary | ICD-10-CM | POA: Insufficient documentation

## 2018-01-14 NOTE — Progress Notes (Signed)
Hematology/Oncology Follow Up Note [facility]  Telephone:([phone] [phone]  Patient Care Team: [name], MD as PCP - General (Internal Medicine)   Name of the patient: Mary [name]  [id number]  1960/12/05   REASON FOR VISIT  follow-up for management of metastatic breast cancer.  Pertinent oncology history # She was diagnosed with right upper quadrant breast in August 2013, status post neoadjuvant chemotherapy with FEC x4 followed by weekly Taxol x2, underwent bilateral mastectomy [February 2014], right ALND, and left sentinel LN biopsy.  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  Tis N0.  Patient also received adjuvant radiation.   Patient was started initially on tamoxifen plus ovarian suppression with Zoladex in June 2014,  and changed to Aromasin plus Zoladex monthly since June 2015.follows up with Dr.Gudina Loleta Dicker at [facility] in [place]. reports that she twisted and sprained her left ankle when getting out of her car on 12/11/2017.  Ankle has been swollen.  #Patient presented to emergency room due to abnormal lab values.  Calcium level was 13.9 with creatinine 1.63.  Also endorses right lower quadrant/right flank/right lower back pain for the past couple of days.  Also having had episodes of dizziness which were diagnosed as vertigo since April 2019.  She had a negative MRI brain done at that time which were essentially negative for acute findings.  In the emergency room CT renal stone study were independently reviewed by me which showed bilateral hydronephrosis, due to the stricture at the level of UPJ's.  Masslike thickening of the anterior and the lateral bladder wall suspicious for metastatic implant.  Mesenteric, omental, peritoneal implants with small ascites and diffuse mesenteric edema.  Small to moderate bilateral pleural effusion  with associated compressive atelectasis of the lung.  Diffuse small osseous skeletal but stated disease.  Patient was seen and evaluated by urology Dr. Erlene Quan.  She is status post bilateral ureter stent placement and infiltrative bladder mass biopsy. Unfortunately her creatinine increased further during the admission and eventually she got bilateral percutaneous nephrostomy tube placed.  Creatinine gradually gets better after hydration. For her hypercalcemia, patient received 1 dose of calcitonin and 1 dose of Zometa.  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 She has visceral crisis with metastatic breast cancer.  In order to avoid further delay outpatient, she was started on first dose of Taxol 80 mg/m on 12/22/2017 after detailed discussion of chemotherapy side effects and complications.  #Left lower extremity DVT, she was anticoagulated with heparin drip.  Switched to Eliquis 5 mg twice daily outpatient. # Patient previously follows up at [facility] cancer center.  She prefers to follow-up with [place] [facility] as it is closer to her home.  INTERVAL HISTORY 57 year old female with newly diagnosed metastatic breast lobular carcinoma presents for assessment of fever.  She started to have fever of 102, called answering service. She was evaluated by home health RN. On call physician called in Bactrim DS BID x 3 days. Patient reports also taking Tylenol for fever.  She feels better. No additional episodes of fever.   Lower extremity swelling has improved significantly.  Appetite is poor    Review of Systems  Constitutional: Positive for fever and malaise/fatigue. Negative for chills and weight loss.  HENT: Negative for nosebleeds and  sore throat.   Eyes: Negative for double vision, photophobia and redness.    Respiratory: Negative for cough, shortness of breath and wheezing.   Cardiovascular: Negative for chest pain, palpitations, orthopnea and leg swelling.  Gastrointestinal: Negative for abdominal pain, blood in stool, nausea and vomiting.  Genitourinary: Negative for dysuria.  Musculoskeletal: Negative for back pain, myalgias and neck pain.  Skin: Negative for itching and rash.  Neurological: Negative for dizziness, tingling and tremors.  Endo/Heme/Allergies: Negative for environmental allergies. Does not bruise/bleed easily.  Psychiatric/Behavioral: Negative for depression.      Allergies  Allergen Reactions  . Adhesive [Tape] Other (See Comments)    Redness:Please use paper tape     Past Medical History:  Diagnosis Date  . Anxiety    takes Xanax prn  . Breast cancer [facility]) August 2013   bilateral, er/pr+  . Complication of anesthesia    pt states she had the shakes extremely bad  . Cough    at night d/t reflux;nothing productive  . Dental crowns present    x 4  . GERD (gastroesophageal reflux disease)    takes Nexium daily  . History of bronchitis    08/2011  . History of colon polyps   . Hot flashes, menopausal 08/08/2012  . IBS (irritable bowel syndrome)    immodium prn  . Insomnia    takes Ambien nightly  . Irritable bowel syndrome   . Neutropenia, drug-induced ([place]) 12/15/2011  . Nocturia   . Plantar fasciitis   . S/P radiation therapy 07/03/2012 through 08/18/2038                                                        Right chest wall/regional lymph nodes 5040 cGy 28 sessions, right chest wall/mastectomy scar boost 1000 cGy 5 sessions                          . Status post chemotherapy    FEC 100 starting on 12/08/2011 - 01/18/12/single agent Taxol x12 weeks from 02/02/2012 through January 2014   . Urinary frequency   . Use of tamoxifen (Nolvadex)   . Vertigo      Past Surgical History:  Procedure Laterality Date  . BREAST LUMPECTOMY  2008   left  .  COLONOSCOPY    . COLONOSCOPY WITH PROPOFOL N/A 10/10/2017   Procedure: COLONOSCOPY WITH PROPOFOL;  Surgeon: Lucilla Lame, MD;  Location: [facility] ENDOSCOPY;  Service: Endoscopy;  Laterality: N/A;  . CYSTOSCOPY WITH STENT PLACEMENT Bilateral 12/17/2017   Procedure: CYSTOSCOPY WITH STENT PLACEMENT;  Surgeon: Hollice Espy, MD;  Location: [facility] ORS;  Service: Urology;  Laterality: Bilateral;  . IR NEPHROSTOMY EXCHANGE RIGHT  01/02/2018  . IR NEPHROSTOMY PLACEMENT LEFT  12/19/2017  . IR NEPHROSTOMY PLACEMENT RIGHT  12/19/2017  . MASTECTOMY    . MASTECTOMY WITH AXILLARY LYMPH NODE DISSECTION Right 05/18/2012   Procedure: MASTECTOMY WITH AXILLARY LYMPH NODE DISSECTION;  Surgeon: Adin Hector, MD;  Location: [place];  Service: General;  Laterality: Right;  Bilateral Total Mastectomy , right axillary lymph node dissection left axillary sentinel node biopsy, removal of Porta Cath  . PORT-A-CATH REMOVAL N/A 05/18/2012   Procedure: REMOVAL PORT-A-CATH;  Surgeon: Adin Hector, MD;  Location: [place];  Service: General;  Laterality: N/A;  .  PORTA CATH INSERTION N/A 12/26/2017   Procedure: PORTA CATH INSERTION;  Surgeon: Katha Cabal, MD;  Location: [place] CV [facility];  Service: Cardiovascular;  Laterality: N/A;  . PORTACATH PLACEMENT  12/02/2011   Procedure: INSERTION PORT-A-CATH;  Surgeon: Adin Hector, MD;  Location: [place];  Service: General;  Laterality: N/A;  insertion port a cath with fluoroscopy  . SIMPLE MASTECTOMY WITH AXILLARY SENTINEL NODE BIOPSY Left 05/18/2012   Procedure: SIMPLE MASTECTOMY WITH AXILLARY SENTINEL NODE BIOPSY;  Surgeon: Adin Hector, MD;  Location: [place];  Service: General;  Laterality: Left;    Social History   Socioeconomic History  . Marital status: Married    Spouse name: Not on file  . Number of children: 0  . Years of education: Not on file  . Highest education level: Not on file  Occupational History  . Occupation: Network engineer II     Employer: [employer]  Social Needs  . Financial resource strain: Not on file  . Food insecurity:    Worry: Not on file    Inability: Not on file  . Transportation needs:    Medical: Not on file    Non-medical: Not on file  Tobacco Use  . Smoking status: Former Smoker    Packs/day: 0.50    Years: 15.00    Pack years: 7.50    Types: Cigarettes    Last attempt to quit: 03/21/1997    Years since quitting: 20.8  . Smokeless tobacco: Never Used  Substance and Sexual Activity  . Alcohol use: Not Currently    Alcohol/week: 0.0 standard drinks    Comment: occasionally wine  . Drug use: No  . Sexual activity: Yes    Birth control/protection: None  Lifestyle  . Physical activity:    Days per week: Not on file    Minutes per session: Not on file  . Stress: Not on file  Relationships  . Social connections:    Talks on phone: Not on file    Gets together: Not on file    Attends religious service: Not on file    Active member of club or organization: Not on file    Attends meetings of clubs or organizations: Not on file    Relationship status: Not on file  . Intimate partner violence:    Fear of current or ex partner: Not on file    Emotionally abused: Not on file    Physically abused: Not on file    Forced sexual activity: Not on file  Other Topics Concern  . Not on file  Social History Narrative   Married to [employer]  No children 57 years old with fmp.  20 year use of birth control pills.      Family History  Problem Relation Age of Onset  . Hypertension Mother   . Diabetes Mother   . Hypertension Maternal Grandmother   . Pancreatic cancer Paternal Grandmother        diagnosed in her 86s  . Ovarian cancer Maternal Aunt        maternal grandmother's sister  . Breast cancer Paternal Aunt        diagnosed in late 35s early 90s  . Parkinsonism Paternal Grandfather      Current Outpatient Medications:  .  amLODipine (NORVASC) 5 MG tablet, TAKE 1 TABLET BY MOUTH  DAILY., Disp: 90 tablet, Rfl: 1 .  apixaban (ELIQUIS) 5 MG TABS tablet, Two tablets twice a day for seven days  then one tablet twice a day afterwards, Disp: 74 tablet, Rfl: 0 .  Ascorbic Acid (VITAMIN C) 100 MG tablet, Take 100 mg by mouth daily.  , Disp: , Rfl:  .  Calcium Carbonate-Vitamin D (CALTRATE 600+D PO), Take 1 tablet by mouth daily., Disp: , Rfl:  .  dexamethasone (DECADRON) 4 MG tablet, Take 1 tablet (4 mg total) by mouth See admin instructions. You may take 4 mg daily for 2 days after chemotherapy if needed for nausea, Disp: 30 tablet, Rfl: 0 .  diazepam (VALIUM) 10 MG tablet, Take 1 tablet (10 mg total) by mouth at bedtime as needed for anxiety., Disp: 30 tablet, Rfl: 5 .  esomeprazole (NEXIUM) 40 MG capsule, Take 40 mg by mouth daily before breakfast., Disp: , Rfl:  .  ferrous sulfate 325 (65 FE) MG EC tablet, Take 1 tablet (325 mg total) by mouth daily., Disp: 30 tablet, Rfl: 3 .  furosemide (LASIX) 20 MG tablet, Take 1 tablet (20 mg total) by mouth daily as needed., Disp: 30 tablet, Rfl: 1 .  lidocaine-prilocaine (EMLA) cream, Apply to affected area once, Disp: 30 g, Rfl: 3 .  loperamide (IMODIUM A-D) 2 MG tablet, Take 2 mg by mouth 4 (four) times daily as needed for diarrhea or loose stools. , Disp: , Rfl:  .  Multiple Vitamin (MULTIVITAMIN) tablet, Take 1 tablet by mouth daily.  , Disp: , Rfl:  .  ondansetron (ZOFRAN) 4 MG tablet, Take 1 tablet (4 mg total) by mouth every 8 (eight) hours as needed for nausea or vomiting., Disp: 20 tablet, Rfl: 0 .  oxybutynin (DITROPAN) 5 MG tablet, Take 1 tablet (5 mg total) by mouth every 8 (eight) hours as needed for bladder spasms., Disp: 20 tablet, Rfl: 0 .  potassium chloride SA (K-DUR,KLOR-CON) 20 MEQ tablet, Take 1 tablet (20 mEq total) by mouth daily., Disp: 30 tablet, Rfl: 0 .  prochlorperazine (COMPAZINE) 10 MG tablet, Take 1 tablet (10 mg total) by mouth every 6 (six) hours as needed (Nausea or vomiting)., Disp: 30 tablet, Rfl: 1 .   scopolamine (TRANSDERM-SCOP) 1 MG/3DAYS, Place 1 patch (1.5 mg total) onto the skin every 3 (three) days., Disp: 10 patch, Rfl: 0 .  sulfamethoxazole-trimethoprim (BACTRIM DS,SEPTRA DS) 800-160 MG tablet, Take 1 tablet by mouth 2 (two) times daily., Disp: 6 tablet, Rfl: 0 .  traMADol (ULTRAM) 50 MG tablet, Take 1 tablet (50 mg total) by mouth every 6 (six) hours as needed., Disp: 60 tablet, Rfl: 0 .  traZODone (DESYREL) 50 MG tablet, Take 0.5-1 tablets (25-50 mg total) by mouth at bedtime as needed for sleep., Disp: 90 tablet, Rfl: 1  Physical exam: ECOG 2 Vitals:   01/11/18 1345  BP: 103/68  Pulse: 96  Resp: 18  Temp: 99.3 F (37.4 C)  TempSrc: Tympanic   Physical Exam  Constitutional: She is oriented to person, place, and time. No distress.  HENT:  Head: Normocephalic and atraumatic.  Mouth/Throat: Oropharynx is clear and moist.  Eyes: Pupils are equal, round, and reactive to light. EOM are normal. No scleral icterus.  Neck: Normal range of motion. Neck supple.  Cardiovascular: Normal rate, regular rhythm and normal heart sounds.  Pulmonary/Chest: Effort normal. No respiratory distress. She has no wheezes.  Decreased breath sounds at right lower base, improved.  Abdominal: Soft. Bowel sounds are normal. She exhibits no distension and no mass. There is no tenderness.  Genitourinary:  Genitourinary Comments: Bilateral percutaneous nephrostomy tube, bloodstained urine in the nephrostomy bag.  Musculoskeletal: Normal range of motion. She exhibits no edema or deformity.  Bilateral trace pitting edema  Neurological: She is alert and oriented to person, place, and time. No cranial nerve deficit. Coordination normal.  Skin: Skin is warm and dry. No rash noted. No erythema.  Psychiatric: She has a normal mood and affect. Her behavior is normal. Thought content normal.    CMP Latest Ref Rng & Units 01/11/2018  Glucose 70 - 99 mg/dL 125(H)  BUN 6 - 20 mg/dL 18  Creatinine 0.44 - 1.00  mg/dL 1.04(H)  Sodium 135 - 145 mmol/L 135  Potassium 3.5 - 5.1 mmol/L 4.0  Chloride 98 - 111 mmol/L 104  CO2 22 - 32 mmol/L 22  Calcium 8.9 - 10.3 mg/dL 8.3(L)  Total Protein 6.5 - 8.1 g/dL 7.4  Total Bilirubin 0.3 - 1.2 mg/dL 0.5  Alkaline Phos 38 - 126 U/L 151(H)  AST 15 - 41 U/L 38  ALT 0 - 44 U/L 45(H)   CBC Latest Ref Rng & Units 01/11/2018  WBC 4.0 - 10.5 K/uL 6.0  Hemoglobin 12.0 - 15.0 g/dL 9.6(L)  Hematocrit 36.0 - 46.0 % 30.5(L)  Platelets 150 - 400 K/uL 361    Dg Chest 2 View  Result Date: 12/20/2017 CLINICAL DATA:  Shortness of breath. Weakness. EXAM: CHEST - 2 VIEW COMPARISON:  12/02/2011 FINDINGS: The cardiac silhouette is normal in size. Left chest Port-A-Cath has been removed. Surgical clips are present in the axillae. There are new moderate right and small left pleural effusions with atelectasis or consolidation in both lung bases, particularly medially. No pneumothorax is identified. No acute osseous abnormality is seen. IMPRESSION: New moderate right and small left pleural effusions with bibasilar atelectasis or consolidation. Electronically Signed   By: Logan Bores M.D.   On: 12/20/2017 13:43   Mr Jeri Cos HE Contrast  Result Date: 01/03/2018 CLINICAL DATA:  57 year old female with history of metastatic breast cancer. Dizziness. EXAM: MRI HEAD WITHOUT AND WITH CONTRAST TECHNIQUE: Multiplanar, multiecho pulse sequences of the brain and surrounding structures were obtained without and with intravenous contrast. 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 No midline shift, mass effect, or evidence of intracranial mass lesion. No dural thickening. Stable cerebral volume. Chronic partially empty sella. Stable ventricular prominence. No transependymal edema. No restricted  diffusion to suggest acute infarction. No extra-axial collection or acute intracranial hemorrhage. Cervicomedullary junction within normal limits. 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 No other chronic cerebral blood products. Vascular: Major intracranial vascular flow voids are stable. The major dural venous sinuses are enhancing and appear patent. Skull and upper cervical spine: Heterogeneous loss of the normal T1 bone marrow signal in the skull and cervical spine since 2018. No destructive osseous lesion is identified. Negative visible cervical spinal cord. Sinuses/Orbits: Stable and negative. Other: Mastoid air cells are clear. Visible internal auditory structures appear normal. Scalp and face soft tissues appear negative. IMPRESSION: 1. Heterogeneous decreased bone marrow signal since 2018. This could be osseous metastatic disease, but red marrow reactivation such as in the setting of chemotherapy could also have this appearance. 2. Stable MRI appearance of the brain with no cerebral metastatic disease or acute  intracranial abnormality identified. 3. 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 Electronically Signed   By: Genevie Ann M.D.   On: 01/03/2018 17:14   US Renal  Result Date: 12/18/2017 CLINICAL DATA:  Hydronephrosis aunt LOWER abdominal pain for 2-3 months. History of cystoscopy with bilateral stent placement 12/17/2017. EXAM: RENAL / URINARY TRACT ULTRASOUND COMPLETE COMPARISON:  CT of the abdomen and pelvis on 12/16/2017 FINDINGS: Right Kidney: Length: 12.1 centimeters. There is moderate hydronephrosis. Stent is visualized within the RIGHT renal pelvis. No solid or cystic renal mass. Left Kidney: Length: 12.7 centimeter. There is moderate  hydronephrosis. The ureteral stent is not well seen in the LEFT renal pelvis. Bladder: Bladder is decompressed by a Foley catheter. Additional: There are bilateral pleural effusions.  Ascites. IMPRESSION: 1. Moderate bilateral hydronephrosis. 2. Foley catheter decompresses the bladder. 3. Bilateral pleural effusions.  Ascites. Electronically Signed   By: Nolon Nations M.D.   On: 12/18/2017 16:36   US Venous Img Lower Unilateral Left  Result Date: 12/16/2017 CLINICAL DATA:  Lower extremity edema EXAM: LEFT LOWER EXTREMITY VENOUS DUPLEX ULTRASOUND TECHNIQUE: Doppler venous assessment of the left lower extremity deep venous system was performed, including characterization of spectral flow, compressibility, and phasicity. COMPARISON:  None. FINDINGS: There is complete compressibility of the left common femoral and femoral veins. The left popliteal vein is partially occlusive and contains partially occlusive DVT. Thrombus is mixed echogenicity with areas of hypoechoic and hyperechoic signal. There is occlusive thrombus in the posterior tibial and peroneal veins. Doppler analysis demonstrates respiratory phasicity and augmentation. IMPRESSION: The study is positive for DVT in the popliteal, posterior tibial, and peroneal veins as described. Critical Value/emergent results were called by telephone at the time of interpretation on 12/16/2017 at 9:24 am to Dr. Alfred Levins, who verbally acknowledged these results. Electronically Signed   By: Marybelle Killings M.D.   On: 12/16/2017 09:25   Ct Renal Stone Study  Result Date: 12/16/2017 CLINICAL DATA:  57 year old female with abdominal pain. History of breast cancer and bilateral mastectomy. Patient is on chemo and radiation treatment. EXAM: CT ABDOMEN AND PELVIS WITHOUT CONTRAST TECHNIQUE: Multidetector CT imaging of the abdomen and pelvis was performed following the standard protocol without IV contrast. COMPARISON:  PET-CT dated 12/01/2011 FINDINGS: Evaluation of this exam  is limited in the absence of intravenous contrast. Lower chest: Partially visualized small to moderate bilateral pleural effusions with associated partial compressive atelectasis of the adjacent lungs and areas of linear atelectasis/scarring. No intra-abdominal free air. Diffuse mesenteric edema and stranding and small ascites. Hepatobiliary: Subcentimeter hypodense focus in the right lobe of the liver is not characterized. The liver is otherwise unremarkable on this noncontrast CT. Probable tiny stone or sludge within the gallbladder. Pancreas: Unremarkable. No pancreatic ductal dilatation or surrounding inflammatory changes. Spleen: Normal in size without focal abnormality. Adrenals/Urinary Tract: Mild bilateral adrenal thickening. There is moderate bilateral hydronephrosis. The ureters are nondistended. The hydronephrosis is likely related to a degree of stricture at the ureteropelvic junctions secondary to retroperitoneal adhesions/fibrosis. No stone identified within the kidneys or along the course of the ureters. There is masslike thickening of the anterior and left lateral bladder wall likely metastatic disease. Stomach/Bowel: There is diffuse thickening of the stomach which may be partly related to underdistention and ascites. Gastritis or an infiltrative process is not entirely excluded. There is no bowel obstruction. The appendix is normal as visualized. Vascular/Lymphatic: Mild aortoiliac atherosclerotic disease. The abdominal aorta and IVC are grossly unremarkable on this noncontrast CT.  No portal venous gas. There are multiple somewhat small retroperitoneal and para-aortic lymph nodes. A mildly enlarged right iliac chain node measures 11 mm in short axis. There is diffuse mesenteric and omental nodularity and edema consistent with metastatic implants. Multiple top-normal and mildly rounded mesenteric lymph nodes measure up to 8 mm in short axis. Reproductive: Evaluation of the pelvic structures is very  limited. The uterus appears retroflexed. A 3.3 x 3.9 x 2.6 cm ill-defined high attenuating solid lesion to the right of the uterine fundus is not well evaluated and may represent a fibroid or a complex lesion arising from the right adnexa. However, a neoplastic mass is not excluded. Pelvic ultrasound may provide better evaluation. Other: There is mild diffuse subcutaneous edema of the abdominal wall. Musculoskeletal: Multiple small scattered sclerotic lesions throughout the spine most consistent with metastatic disease. No acute fracture. IMPRESSION: 1. Moderate bilateral hydronephrosis, likely related to strictures at the level of the UPJ's, and likely secondary to retroperitoneal adhesions or fibrosis. No obstructing stone. 2. Masslike thickening of the anterior and left lateral bladder wall suspicious for metastatic implant. 3. Mesenteric, omental, and peritoneal implants with a small ascites and diffuse mesenteric edema. 4. Small to moderate bilateral pleural effusions with associated compressive atelectasis of the lungs. 5. Diffuse small osseous sclerotic metastatic disease. Electronically Signed   By: Anner Crete M.D.   On: 12/16/2017 06:53   Ir Nephrostomy Placement Left  Result Date: 12/20/2017 INDICATION: Bilateral ureteral obstruction with occluded ureteral stents EXAM: IR NEPHROSTOMY PLACEMENT LEFT COMPARISON:  None. MEDICATIONS: 2 g IV Ancef; The antibiotic was administered in an appropriate time frame prior to skin puncture. ANESTHESIA/SEDATION: Fentanyl 75 mcg IV; Versed 1 mg IV Moderate Sedation Time:  35 The patient was continuously monitored during the procedure by the interventional radiology nurse under my direct supervision. CONTRAST:  59mL ISOVUE-300 IOPAMIDOL (ISOVUE-300) INJECTION 61% - administered into the collecting system(s) FLUOROSCOPY TIME:  Fluoroscopy Time: 5 minutes 48 seconds (356 mGy). COMPLICATIONS: None immediate. PROCEDURE: Informed written consent was obtained from the  patient after a thorough discussion of the procedural risks, benefits and alternatives. All questions were addressed. Maximal Sterile Barrier Technique was utilized including caps, mask, sterile gowns, sterile gloves, sterile drape, hand hygiene and skin antiseptic. A timeout was performed prior to the initiation of the procedure. Utilizing 1% lidocaine as a local and deep anesthetic and fluoroscopic guidance, a 21 gauge needle was utilized to puncture the collecting system in the region of the left renal pelvis guided by a previously placed stent. Brown colored urine likely related to prior hemorrhage was withdrawn from the needle and contrast injection confirmed location within the collecting system. Subsequently a small amount of room air was injected to allow for delineation of posterior caliceal system. A lower pole posterior calyx was identified and again utilizing 1% xylocaine as local anesthetic and fluoroscopic guidance, a 20 gauge diamond tip needle was passed into the posterior calyx. Both air and urine were withdrawn from the needle and subsequently a 0.018 platinum tip guidewire was advanced into the collecting system. Over this, a 6 [place] Accustick dilator was placed into the collecting system and a J wire coiled within the renal pelvis. The axis sick dilator was then removed and the tract dilated to 8 [place]. The dilator was then removed and a 8 French pigtail catheter was placed over the wire and coiled within the renal pelvis. The catheter was initially placed adjacent to the body of the stent although no significant drainage of  urine was noted. Contrast material was injected which demonstrates a significant amount of thrombus within the collecting system and for this reason the catheter was advanced into the superior aspect of the renal pelvis where adequate urine drainage was noted. The catheter was then sutured into place utilizing 0 Prolene and a Hopkins buttress. Catheter was placed external  gravity drain and bloody urine was obtained. The patient tolerated the catheter placement well and attention was then turned to the right collecting system. IMPRESSION: Successful placement of an 8 French pigtail catheter within the left renal collecting system. Thrombus is noted within the collecting system which may limit drainage somewhat. This should resolve over time. Electronically Signed   By: Inez Catalina M.D.   On: 12/20/2017 08:02   Ir Nephrostomy Placement Right  Result Date: 12/20/2017 INDICATION: Ureteral occlusion with occluded ureteral stents EXAM: IR NEPHROSTOMY PLACEMENT RIGHT COMPARISON:  None. MEDICATIONS: 2 g IV Ancef; The antibiotic was administered in an appropriate time frame prior to skin puncture. ANESTHESIA/SEDATION: Fentanyl 75 mcg IV; Versed 1 mg IV Moderate Sedation Time:  35 The patient was continuously monitored during the procedure by the interventional radiology nurse under my direct supervision. CONTRAST:  21mL ISOVUE-300 IOPAMIDOL (ISOVUE-300) INJECTION 61% - administered into the collecting system(s) FLUOROSCOPY TIME:  Fluoroscopy Time: 5 minutes 48 seconds (356 mGy). COMPLICATIONS: None immediate. PROCEDURE: Informed written consent was obtained from the patient after a thorough discussion of the procedural risks, benefits and alternatives. All questions were addressed. Maximal Sterile Barrier Technique was utilized including caps, mask, sterile gowns, sterile gloves, sterile drape, hand hygiene and skin antiseptic. A timeout was performed prior to the initiation of the procedure. Utilizing 1% xylocaine as local anesthetic and fluoroscopic guidance a 21 gauge Chiba needle was placed into the right collecting system utilizing the pigtail from the previously placed ureteral stent as a guide. Brown tinged urine was withdrawn from the needle and subsequently a small amount of contrast was instilled to confirm needle placement. This demonstrates dilated collecting system.  Subsequently a small amount of room air was instilled to allow for delineation of a posterior calyx. Again utilizing 1% xylocaine as local anesthetic and fluoroscopic guidance, a 20 gauge diamond tip needle was placed into the upper pole posterior calyx without difficulty. A 0.018 platinum tip guidewire was then advanced into the collecting system and coiled within the renal pelvis. A 6 French Accustick dilator was then placed over the microwire into the collecting system. The central dilator in stiff near were then removed and a J tipped guidewire was coiled within the renal pelvis. Dilatation to 10 [place] was then performed and a 10 French nephrostomy catheter placed over the guidewire into the renal pelvis. Brisk return of bloody urine was then noted. The catheter was then attached to an external gravity bag. The catheter was sutured into place utilizing 0 Prolene and a Hopkins buttress. Patient tolerated the procedure well and was returned to her room in satisfactory condition. IMPRESSION: Successful right percutaneous nephrostomy placement as described above. Bloody urine was noted at the termination of the procedure which should clear over time. Electronically Signed   By: Inez Catalina M.D.   On: 12/20/2017 08:06   Ir Nephrostomy Exchange Right  Result Date: 01/02/2018 INDICATION: Leaking right-sided nephrostomy catheter. Patient with history of metastatic breast cancer with obstructive bilateral hydronephrosis, failed placement of bilateral internal ureteral stents, for which patient underwent bilateral image guided nephrostomy catheter placement on 12/19/2017. Patient now returns to the Interventional Radiology department with  complaint of leakage surrounding the right-sided percutaneous nephrostomy catheter. The left-sided nephrostomy catheter is functioning well without leakage. EXAM: FLUOROSCOPIC GUIDED RIGHT SIDED NEPHROSTOMY CATHETER EXCHANGE COMPARISON:  Image guided bilateral nephrostomy catheter  placement-12/19/2017; CT abdomen and pelvis-12/16/2017 CONTRAST:  15 mL Isovue-300 administered into the right renal collecting system. FLUOROSCOPY TIME:  24 seconds (5 mGy) COMPLICATIONS: None immediate. TECHNIQUE: Informed written consent was obtained from the patient after a discussion of the risks, benefits and alternatives to treatment. Questions regarding the procedure were encouraged and answered. A timeout was performed prior to the initiation of the procedure. The right flank and external portion of existing nephrostomy catheter were prepped and draped in the usual sterile fashion. A sterile drape was applied covering the operative field. Maximum barrier sterile technique with sterile gowns and gloves were used for the procedure. A timeout was performed prior to the initiation of the procedure. A pre procedural spot fluoroscopic image was obtained after contrast was injected via the existing nephrostomy catheter demonstrating appropriate positioning within the renal pelvis. The existing nephrostomy catheter was cut and cannulated with a Benson wire which was coiled within the renal pelvis. Under intermittent fluoroscopic guidance, the existing nephrostomy catheter was exchanged for a new 10.2 [place] all-purpose drainage catheter. Contrast injection confirmed appropriate positioning within the renal pelvis and a post exchange fluoroscopic image was obtained. The catheter was locked and secured to the skin with a suture. A dressing was placed. The patient tolerated the procedure well without immediate postprocedural complication. FINDINGS: Preprocedural spot fluoroscopic image demonstrates unchanged positioning of right-sided nephrostomy catheter with and coiled and locked overlying expected location right renal pelvis. There is slight resistance to contrast injection of the right-sided nephrostomy catheter however ultimately there was opacification of the right renal collecting system. Note is made  nonocclusive filling defect within the central aspect of the right renal pelvis compatible with a blood clot. After successful fluoroscopic guided exchange, the new nephrostomy catheter is coiled and locked within the right renal pelvis. IMPRESSION: Successful fluoroscopic guided exchange of right sided 10.2 French percutaneous nephrostomy catheter. PLAN: - The etiology of the patient's right-sided nephrostomy catheter leakage is favored to be secondary to occlusion of the nephrostomy tube due to blood within the right renal collecting system likely at least partially attributable to the patient's anticoagulated state (patient is on Xarelto for lower extremity DVT). As such, patient was instructed to flush both nephrostomy catheters twice a day with 5-10 cc of saline to ensure patency of both nephrostomy catheters. - routine fluoroscopic guided exchange will be scheduled 2 months following placement (early December). Consideration for conversion of the patient's bilateral nephrostomy catheters to bilateral nephroureteral catheters could be considered at that time as indicated. Electronically Signed   By: Sandi Mariscal M.D.   On: 01/02/2018 12:56     Assessment and plan Patient is a 57 y.o. female with metastatic breast cancer, visceral crisis present for follow-up prior to chemotherapy treatment. 1. Fever, unspecified fever cause   2. Poor appetite   3. Nephrostomy status ([place])   4. Metastatic breast cancer ([place])   5. Acute deep vein thrombosis (DVT) of proximal vein of left lower extremity ([facility])    # Fever, has already been on Bactrim DS BID.  No more additional fever episode. Advise patient to call me or present to ER if she has fever again.  Will check blood culture if having fever again.   #Lower extremity DVT in metastatic cancer setting.  Continue with Eliquis 5 mg twice  daily.  Duration of anticoagulation is long-term.  Discussed with patient.  # Poor appetite, refer to dietitian.  Follow-up  in 1 week for assessment prior to next cycle of chemotherapy.  We spent sufficient time to discuss many aspect of care, questions were answered to patient's satisfaction. Total face to face encounter time for this patient visit was 15 min. >50% of the time was  spent in counseling and coordination of care.  Earlie Server, MD, PhD Hematology Oncology [facility] ([facility] at [facility] Pager- [phone] 01/14/2018

## 2018-01-15 ENCOUNTER — Inpatient Hospital Stay: Payer: 59

## 2018-01-15 DIAGNOSIS — Z7901 Long term (current) use of anticoagulants: Secondary | ICD-10-CM | POA: Diagnosis not present

## 2018-01-15 DIAGNOSIS — C799 Secondary malignant neoplasm of unspecified site: Secondary | ICD-10-CM | POA: Diagnosis not present

## 2018-01-15 DIAGNOSIS — Z936 Other artificial openings of urinary tract status: Secondary | ICD-10-CM | POA: Diagnosis not present

## 2018-01-15 DIAGNOSIS — C50919 Malignant neoplasm of unspecified site of unspecified female breast: Secondary | ICD-10-CM | POA: Diagnosis not present

## 2018-01-15 DIAGNOSIS — Z760 Encounter for issue of repeat prescription: Secondary | ICD-10-CM | POA: Diagnosis not present

## 2018-01-15 DIAGNOSIS — Z436 Encounter for attention to other artificial openings of urinary tract: Secondary | ICD-10-CM | POA: Diagnosis not present

## 2018-01-15 NOTE — Progress Notes (Signed)
Nutrition Assessment   Reason for Assessment:   Referral from Dr Mary [name] for poor appetite   ASSESSMENT:  57 year old female with metastatic breast cancer.  Patient s/p bilateral mastectomy, GERD, IBS.  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  Patient receiving chemotherapy.    Met with patient, husband and mother this am.  Patient reports poor appetite and taste changes.  Reports has been drinking premier protein shakes daily.  Breakfast is usually bagel with cream cheese or cereal and coffee or eggs and toast with bacon and juice.  Lunch is usually hot dog or cheeseburger as those have been tasting better to her.  Also sweet things tend to taste better.  Supper is spaghetti or soups or roast with carrots and onions recently. Macaroni and cheese is working well.     Nutrition Focused Physical Exam: deferred   Medications: vit c, fe sulfate, lasix, MVI, zofran, KCL   Labs: glucose 125, creatinine 1.04, Calcium 8.3   Anthropometrics:   Height: 64 inches Weight: 154 lb 11.2 oz UBW: 169-170 lb per patient, 169 lb noted on 10/8 BMI: 26  8% weight loss in the last 20 days. Patient does report that has been holding on to a lot of fluid lately and has lost about 20 lb of fluid.  Reports no longer taking the lasix   Estimated Energy Needs  Kcals: 1750-2100 calories Protein: 80-100 g  Fluid: 2.1 L/d   NUTRITION DIAGNOSIS: Inadequate oral intake related to cancer related treatment side effects as evidenced by poor appetite.     MALNUTRITION DIAGNOSIS: continue to monitor   INTERVENTION:  Discussed strategies to increase calories and protein during poor appetite.  Fact sheet given Encouraged higher calorie shakes and samples given today with coupons Gave recipes for homemade shakes with additional calories and protein Encouraged small frequent meals.    MONITORING, EVALUATION, GOAL: weight trends, intake   Next Visit: patient to  contact RD as needed.  Declined face to face follow-up visit.  Contact information provided  Saahas Hidrogo B. Zenia Resides, [place], [place] Registered Dietitian [phone] (pager)

## 2018-01-16 DIAGNOSIS — C799 Secondary malignant neoplasm of unspecified site: Secondary | ICD-10-CM | POA: Diagnosis not present

## 2018-01-16 DIAGNOSIS — Z936 Other artificial openings of urinary tract status: Secondary | ICD-10-CM | POA: Diagnosis not present

## 2018-01-16 DIAGNOSIS — C50919 Malignant neoplasm of unspecified site of unspecified female breast: Secondary | ICD-10-CM | POA: Diagnosis not present

## 2018-01-16 DIAGNOSIS — Z7901 Long term (current) use of anticoagulants: Secondary | ICD-10-CM | POA: Diagnosis not present

## 2018-01-16 DIAGNOSIS — Z436 Encounter for attention to other artificial openings of urinary tract: Secondary | ICD-10-CM | POA: Diagnosis not present

## 2018-01-19 ENCOUNTER — Inpatient Hospital Stay (HOSPITAL_BASED_OUTPATIENT_CLINIC_OR_DEPARTMENT_OTHER): Payer: 59 | Admitting: Oncology

## 2018-01-19 ENCOUNTER — Inpatient Hospital Stay: Payer: 59

## 2018-01-19 ENCOUNTER — Inpatient Hospital Stay: Payer: 59 | Attending: Oncology

## 2018-01-19 ENCOUNTER — Encounter: Payer: Self-pay | Admitting: Oncology

## 2018-01-19 ENCOUNTER — Other Ambulatory Visit: Payer: Self-pay

## 2018-01-19 VITALS — BP 115/75 | HR 90 | Temp 97.8°F | Resp 18 | Wt 150.2 lb

## 2018-01-19 DIAGNOSIS — R5383 Other fatigue: Secondary | ICD-10-CM | POA: Insufficient documentation

## 2018-01-19 DIAGNOSIS — Z800 Family history of malignant neoplasm of digestive organs: Secondary | ICD-10-CM

## 2018-01-19 DIAGNOSIS — Z170 Estrogen receptor positive status [ER+]: Secondary | ICD-10-CM | POA: Diagnosis not present

## 2018-01-19 DIAGNOSIS — Z79899 Other long term (current) drug therapy: Secondary | ICD-10-CM | POA: Insufficient documentation

## 2018-01-19 DIAGNOSIS — R42 Dizziness and giddiness: Secondary | ICD-10-CM | POA: Insufficient documentation

## 2018-01-19 DIAGNOSIS — Z923 Personal history of irradiation: Secondary | ICD-10-CM

## 2018-01-19 DIAGNOSIS — R630 Anorexia: Secondary | ICD-10-CM

## 2018-01-19 DIAGNOSIS — K219 Gastro-esophageal reflux disease without esophagitis: Secondary | ICD-10-CM | POA: Diagnosis not present

## 2018-01-19 DIAGNOSIS — R600 Localized edema: Secondary | ICD-10-CM | POA: Diagnosis not present

## 2018-01-19 DIAGNOSIS — C7911 Secondary malignant neoplasm of bladder: Secondary | ICD-10-CM | POA: Diagnosis not present

## 2018-01-19 DIAGNOSIS — D6481 Anemia due to antineoplastic chemotherapy: Secondary | ICD-10-CM | POA: Insufficient documentation

## 2018-01-19 DIAGNOSIS — H538 Other visual disturbances: Secondary | ICD-10-CM | POA: Diagnosis not present

## 2018-01-19 DIAGNOSIS — C50919 Malignant neoplasm of unspecified site of unspecified female breast: Secondary | ICD-10-CM

## 2018-01-19 DIAGNOSIS — I824Y2 Acute embolism and thrombosis of unspecified deep veins of left proximal lower extremity: Secondary | ICD-10-CM

## 2018-01-19 DIAGNOSIS — Z7901 Long term (current) use of anticoagulants: Secondary | ICD-10-CM | POA: Diagnosis not present

## 2018-01-19 DIAGNOSIS — Z79818 Long term (current) use of other agents affecting estrogen receptors and estrogen levels: Secondary | ICD-10-CM

## 2018-01-19 DIAGNOSIS — M545 Low back pain: Secondary | ICD-10-CM

## 2018-01-19 DIAGNOSIS — Z5111 Encounter for antineoplastic chemotherapy: Secondary | ICD-10-CM

## 2018-01-19 DIAGNOSIS — R5381 Other malaise: Secondary | ICD-10-CM | POA: Insufficient documentation

## 2018-01-19 DIAGNOSIS — Z936 Other artificial openings of urinary tract status: Secondary | ICD-10-CM | POA: Diagnosis not present

## 2018-01-19 DIAGNOSIS — T451X5S Adverse effect of antineoplastic and immunosuppressive drugs, sequela: Secondary | ICD-10-CM | POA: Insufficient documentation

## 2018-01-19 DIAGNOSIS — N189 Chronic kidney disease, unspecified: Secondary | ICD-10-CM

## 2018-01-19 DIAGNOSIS — R634 Abnormal weight loss: Secondary | ICD-10-CM | POA: Diagnosis not present

## 2018-01-19 DIAGNOSIS — D649 Anemia, unspecified: Secondary | ICD-10-CM

## 2018-01-19 DIAGNOSIS — Z803 Family history of malignant neoplasm of breast: Secondary | ICD-10-CM | POA: Diagnosis not present

## 2018-01-19 DIAGNOSIS — Z87891 Personal history of nicotine dependence: Secondary | ICD-10-CM

## 2018-01-19 DIAGNOSIS — Z9013 Acquired absence of bilateral breasts and nipples: Secondary | ICD-10-CM | POA: Insufficient documentation

## 2018-01-19 DIAGNOSIS — C50411 Malignant neoplasm of upper-outer quadrant of right female breast: Secondary | ICD-10-CM

## 2018-01-19 DIAGNOSIS — C50412 Malignant neoplasm of upper-outer quadrant of left female breast: Secondary | ICD-10-CM

## 2018-01-19 DIAGNOSIS — R601 Generalized edema: Secondary | ICD-10-CM

## 2018-01-19 DIAGNOSIS — H539 Unspecified visual disturbance: Secondary | ICD-10-CM

## 2018-01-19 HISTORY — DX: Chronic kidney disease, unspecified: N18.9

## 2018-01-19 LAB — COMPREHENSIVE METABOLIC PANEL
ALBUMIN: 3.5 g/dL (ref 3.5–5.0)
ALT: 24 U/L (ref 0–44)
AST: 21 U/L (ref 15–41)
Alkaline Phosphatase: 135 U/L — ABNORMAL HIGH (ref 38–126)
Anion gap: 6 (ref 5–15)
BUN: 16 mg/dL (ref 6–20)
CHLORIDE: 107 mmol/L (ref 98–111)
CO2: 25 mmol/L (ref 22–32)
CREATININE: 0.84 mg/dL (ref 0.44–1.00)
Calcium: 8.6 mg/dL — ABNORMAL LOW (ref 8.9–10.3)
GFR calc Af Amer: >60 mL/min (ref >60)
GFR calc non Af Amer: >60 mL/min (ref >60)
GLUCOSE: 156 mg/dL — AB (ref 70–99)
POTASSIUM: 3.7 mmol/L (ref 3.5–5.1)
SODIUM: 138 mmol/L (ref 135–145)
Total Bilirubin: 0.3 mg/dL (ref 0.3–1.2)
Total Protein: 6.9 g/dL (ref 6.5–8.1)

## 2018-01-19 LAB — CBC WITH DIFFERENTIAL/PLATELET
ABS IMMATURE GRANULOCYTES: 0.06 10*3/uL (ref 0.00–0.07)
Basophils Absolute: 0.1 10*3/uL (ref 0.0–0.1)
Basophils Relative: 1 %
Eosinophils Absolute: 0.1 10*3/uL (ref 0.0–0.5)
Eosinophils Relative: 1 %
HEMATOCRIT: 31.7 % — AB (ref 36.0–46.0)
HEMOGLOBIN: 10.1 g/dL — AB (ref 12.0–15.0)
Immature Granulocytes: 1 %
LYMPHS ABS: 1 10*3/uL (ref 0.7–4.0)
LYMPHS PCT: 16 %
MCH: 30.2 pg (ref 26.0–34.0)
MCHC: 31.9 g/dL (ref 30.0–36.0)
MCV: 94.9 fL (ref 80.0–100.0)
MONO ABS: 0.6 10*3/uL (ref 0.1–1.0)
Monocytes Relative: 9 %
NEUTROS ABS: 4.7 10*3/uL (ref 1.7–7.7)
Neutrophils Relative %: 72 %
Platelets: 509 10*3/uL — ABNORMAL HIGH (ref 150–400)
RBC: 3.34 MIL/uL — AB (ref 3.87–5.11)
RDW: 15.3 % (ref 11.5–15.5)
WBC: 6.5 10*3/uL (ref 4.0–10.5)
nRBC: 0 % (ref 0.0–0.2)

## 2018-01-19 MED ORDER — DEXAMETHASONE SODIUM PHOSPHATE 10 MG/ML IJ SOLN
10.0000 mg | Freq: Once | INTRAMUSCULAR | Status: AC
  Administered 2018-01-19: 10 mg via INTRAVENOUS
  Filled 2018-01-19: qty 1

## 2018-01-19 MED ORDER — SODIUM CHLORIDE 0.9 % IV SOLN
Freq: Once | INTRAVENOUS | Status: AC
  Administered 2018-01-19: 10:00:00 via INTRAVENOUS
  Filled 2018-01-19: qty 250

## 2018-01-19 MED ORDER — SODIUM CHLORIDE 0.9 % IV SOLN
80.0000 mg/m2 | Freq: Once | INTRAVENOUS | Status: AC
  Administered 2018-01-19: 144 mg via INTRAVENOUS
  Filled 2018-01-19: qty 24

## 2018-01-19 MED ORDER — HEPARIN SOD (PORK) LOCK FLUSH 100 UNIT/ML IV SOLN
500.0000 [IU] | Freq: Once | INTRAVENOUS | Status: AC
  Administered 2018-01-19: 500 [IU] via INTRAVENOUS
  Filled 2018-01-19: qty 5

## 2018-01-19 MED ORDER — HEPARIN SOD (PORK) LOCK FLUSH 100 UNIT/ML IV SOLN
INTRAVENOUS | Status: AC
  Filled 2018-01-19: qty 5

## 2018-01-19 MED ORDER — FAMOTIDINE IN NACL 20-0.9 MG/50ML-% IV SOLN
20.0000 mg | Freq: Once | INTRAVENOUS | Status: AC
  Administered 2018-01-19: 20 mg via INTRAVENOUS
  Filled 2018-01-19: qty 50

## 2018-01-19 MED ORDER — DIPHENHYDRAMINE HCL 50 MG/ML IJ SOLN
50.0000 mg | Freq: Once | INTRAMUSCULAR | Status: AC
  Administered 2018-01-19: 50 mg via INTRAVENOUS
  Filled 2018-01-19: qty 1

## 2018-01-19 MED ORDER — SODIUM CHLORIDE 0.9% FLUSH
10.0000 mL | INTRAVENOUS | Status: DC | PRN
  Administered 2018-01-19: 10 mL via INTRAVENOUS
  Filled 2018-01-19: qty 10

## 2018-01-19 NOTE — Progress Notes (Signed)
Pt here for follow up. Pt feels that vision has gotten worse. She has not been able to focus on words when reading. This has started several weeks ago.

## 2018-01-19 NOTE — Progress Notes (Signed)
Hematology/Oncology Follow Up Note [facility]  Telephone:([phone] [phone]  Patient Care Team: [name], MD as PCP - General (Internal Medicine)   Name of the patient: Mary [name]  [id number]  Jun 06, 1960   REASON FOR VISIT  follow-up for management of metastatic breast cancer.  Pertinent oncology history # She was diagnosed with right upper quadrant breast in August 2013, status post neoadjuvant chemotherapy with FEC x4 followed by weekly Taxol x2, underwent bilateral mastectomy [February 2014], right ALND, and left sentinel LN biopsy.  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  Tis N0.  Patient also received adjuvant radiation.   Patient was started initially on tamoxifen plus ovarian suppression with Zoladex in June 2014,  and changed to Aromasin plus Zoladex monthly since June 2015.follows up with Dr.Gudina Loleta Dicker at [facility] in [place]. reports that she twisted and sprained her left ankle when getting out of her car on 12/11/2017.  Ankle has been swollen.  #Patient presented to emergency room due to abnormal lab values.  Calcium level was 13.9 with creatinine 1.63.  Also endorses right lower quadrant/right flank/right lower back pain for the past couple of days.  Also having had episodes of dizziness which were diagnosed as vertigo since April 2019.  She had a negative MRI brain done at that time which were essentially negative for acute findings.  In the emergency room CT renal stone study were independently reviewed by me which showed bilateral hydronephrosis, due to the stricture at the level of UPJ's.  Masslike thickening of the anterior and the lateral bladder wall suspicious for metastatic implant.  Mesenteric, omental, peritoneal implants with small ascites and diffuse mesenteric edema.  Small to moderate bilateral pleural effusion  with associated compressive atelectasis of the lung.  Diffuse small osseous skeletal but stated disease.  Patient was seen and evaluated by urology Dr. Erlene Quan.  She is status post bilateral ureter stent placement and infiltrative bladder mass biopsy. Unfortunately her creatinine increased further during the admission and eventually she got bilateral percutaneous nephrostomy tube placed.  Creatinine gradually gets better after hydration. For her hypercalcemia, patient received 1 dose of calcitonin and 1 dose of Zometa.  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 She has visceral crisis with metastatic breast cancer.  In order to avoid further delay outpatient, she was started on first dose of Taxol 80 mg/m on 12/22/2017 after detailed discussion of chemotherapy side effects and complications.  #Left lower extremity DVT, she was anticoagulated with heparin drip.  Switched to Eliquis 5 mg twice daily outpatient. # Patient previously follows up at [facility] center.  She prefers to follow-up with [facility] as it is closer to her home.  INTERVAL HISTORY 57 year old female with newly diagnosed metastatic breast lobular carcinoma presents for assessment prior to chemotherapy treatments.  Marland Kitchen#She was seen by me 1 week ago for assessment of fever.  She finished her course of Bactrim DS twice daily and no additional episodes of fever. Reports feeling well. #Reports that her vision has become worse recently especially reading.  She had an eye examination back in July 2019 and was told everything was fine at that time. Recent MRI did not show any acute intracranial changes. #Bilateral nephrostomy tube placement, reports nephrostomy tubes draining well.  She also has passed a small amount of urine every day. #Lower extremity  swelling has improved.  She has Lasix as needed.  She has been using Lasix 20 mg every other day on average.  Does not have great appetite.  Has lost 4 pounds since clinic visit 2 weeks ago.  Review of Systems  Constitutional: Positive for malaise/fatigue and weight loss. Negative for chills and fever.  HENT: Negative for nosebleeds and sore throat.   Eyes: Negative for double vision, photophobia and redness.  Respiratory: Negative for cough, shortness of breath and wheezing.   Cardiovascular: Negative for chest pain, palpitations, orthopnea and leg swelling.  Gastrointestinal: Negative for abdominal pain, blood in stool, nausea and vomiting.  Genitourinary: Negative for dysuria.       Nephrostomy tube draining well.  Musculoskeletal: Negative for back pain, myalgias and neck pain.  Skin: Negative for itching and rash.  Neurological: Negative for dizziness, tingling and tremors.  Endo/Heme/Allergies: Negative for environmental allergies. Does not bruise/bleed easily.  Psychiatric/Behavioral: Negative for depression.      Allergies  Allergen Reactions  . Adhesive [Tape] Other (See Comments)    Redness:Please use paper tape     Past Medical History:  Diagnosis Date  . Anxiety    takes Xanax prn  . Breast cancer [facility]) August 2013   bilateral, er/pr+  . Complication of anesthesia    pt states she had the shakes extremely bad  . Cough    at night d/t reflux;nothing productive  . Dental crowns present    x 4  . GERD (gastroesophageal reflux disease)    takes Nexium daily  . History of bronchitis    08/2011  . History of colon polyps   . Hot flashes, menopausal 08/08/2012  . IBS (irritable bowel syndrome)    immodium prn  . Insomnia    takes Ambien nightly  . Irritable bowel syndrome   . Neutropenia, drug-induced ([place]) 12/15/2011  . Nocturia   . Plantar fasciitis   . S/P radiation therapy 07/03/2012 through 08/18/2038                                                         Right chest wall/regional lymph nodes 5040 cGy 28 sessions, right chest wall/mastectomy scar boost 1000 cGy 5 sessions                          . Status post chemotherapy    FEC 100 starting on 12/08/2011 - 01/18/12/single agent Taxol x12 weeks from 02/02/2012 through January 2014   . Urinary frequency   . Use of tamoxifen (Nolvadex)   . Vertigo      Past Surgical History:  Procedure Laterality Date  . BREAST LUMPECTOMY  2008   left  . COLONOSCOPY    . COLONOSCOPY WITH PROPOFOL N/A 10/10/2017   Procedure: COLONOSCOPY WITH PROPOFOL;  Surgeon: Lucilla Lame, MD;  Location: [facility] ENDOSCOPY;  Service: Endoscopy;  Laterality: N/A;  . CYSTOSCOPY WITH STENT PLACEMENT Bilateral 12/17/2017   Procedure: CYSTOSCOPY WITH STENT PLACEMENT;  Surgeon: Hollice Espy, MD;  Location: [facility] ORS;  Service: Urology;  Laterality: Bilateral;  . IR NEPHROSTOMY EXCHANGE RIGHT  01/02/2018  . IR NEPHROSTOMY PLACEMENT LEFT  12/19/2017  . IR NEPHROSTOMY PLACEMENT RIGHT  12/19/2017  . MASTECTOMY    .  MASTECTOMY WITH AXILLARY LYMPH NODE DISSECTION Right 05/18/2012   Procedure: MASTECTOMY WITH AXILLARY LYMPH NODE DISSECTION;  Surgeon: Adin Hector, MD;  Location: [facility];  Service: General;  Laterality: Right;  Bilateral Total Mastectomy , right axillary lymph node dissection left axillary sentinel node biopsy, removal of Porta Cath  . PORT-A-CATH REMOVAL N/A 05/18/2012   Procedure: REMOVAL PORT-A-CATH;  Surgeon: Adin Hector, MD;  Location: [facility];  Service: General;  Laterality: N/A;  . PORTA CATH INSERTION N/A 12/26/2017   Procedure: PORTA CATH INSERTION;  Surgeon: Katha Cabal, MD;  Location: [place] CV LAB;  Service: Cardiovascular;  Laterality: N/A;  . PORTACATH PLACEMENT  12/02/2011   Procedure: INSERTION PORT-A-CATH;  Surgeon: Adin Hector, MD;  Location: [facility];  Service: General;  Laterality: N/A;  insertion port a cath with fluoroscopy  . SIMPLE MASTECTOMY WITH AXILLARY  SENTINEL NODE BIOPSY Left 05/18/2012   Procedure: SIMPLE MASTECTOMY WITH AXILLARY SENTINEL NODE BIOPSY;  Surgeon: Adin Hector, MD;  Location: [facility];  Service: General;  Laterality: Left;    Social History   Socioeconomic History  . Marital status: Married    Spouse name: Not on file  . Number of children: 0  . Years of education: Not on file  . Highest education level: Not on file  Occupational History  . Occupation: Network engineer II    Employer: [employer]  Social Needs  . Financial resource strain: Not on file  . Food insecurity:    Worry: Not on file    Inability: Not on file  . Transportation needs:    Medical: Not on file    Non-medical: Not on file  Tobacco Use  . Smoking status: Former Smoker    Packs/day: 0.50    Years: 15.00    Pack years: 7.50    Types: Cigarettes    Last attempt to quit: 03/21/1997    Years since quitting: 20.8  . Smokeless tobacco: Never Used  Substance and Sexual Activity  . Alcohol use: Not Currently    Alcohol/week: 0.0 standard drinks    Comment: occasionally wine  . Drug use: No  . Sexual activity: Yes    Birth control/protection: None  Lifestyle  . Physical activity:    Days per week: Not on file    Minutes per session: Not on file  . Stress: Not on file  Relationships  . Social connections:    Talks on phone: Not on file    Gets together: Not on file    Attends religious service: Not on file    Active member of club or organization: Not on file    Attends meetings of clubs or organizations: Not on file    Relationship status: Not on file  . Intimate partner violence:    Fear of current or ex partner: Not on file    Emotionally abused: Not on file    Physically abused: Not on file    Forced sexual activity: Not on file  Other Topics Concern  . Not on file  Social History Narrative   Married to [employer]  No children 34 years old with fmp.  20 year use of birth control pills.      Family History  Problem Relation Age  of Onset  . Hypertension Mother   . Diabetes Mother   . Hypertension Maternal Grandmother   . Pancreatic cancer Paternal Grandmother        diagnosed in her 39s  .  Ovarian cancer Maternal Aunt        maternal grandmother's sister  . Breast cancer Paternal Aunt        diagnosed in late 59s early 60s  . Parkinsonism Paternal Grandfather      Current Outpatient Medications:  .  amLODipine (NORVASC) 5 MG tablet, TAKE 1 TABLET BY MOUTH DAILY., Disp: 90 tablet, Rfl: 1 .  apixaban (ELIQUIS) 5 MG TABS tablet, Two tablets twice a day for seven days then one tablet twice a day afterwards, Disp: 74 tablet, Rfl: 0 .  dexamethasone (DECADRON) 4 MG tablet, Take 1 tablet (4 mg total) by mouth See admin instructions. You may take 4 mg daily for 2 days after chemotherapy if needed for nausea, Disp: 30 tablet, Rfl: 0 .  diazepam (VALIUM) 10 MG tablet, Take 1 tablet (10 mg total) by mouth at bedtime as needed for anxiety., Disp: 30 tablet, Rfl: 5 .  esomeprazole (NEXIUM) 40 MG capsule, Take 40 mg by mouth daily before breakfast., Disp: , Rfl:  .  ferrous sulfate 325 (65 FE) MG EC tablet, Take 1 tablet (325 mg total) by mouth daily., Disp: 30 tablet, Rfl: 3 .  furosemide (LASIX) 20 MG tablet, Take 1 tablet (20 mg total) by mouth daily as needed., Disp: 30 tablet, Rfl: 1 .  lidocaine-prilocaine (EMLA) cream, Apply to affected area once, Disp: 30 g, Rfl: 3 .  loperamide (IMODIUM A-D) 2 MG tablet, Take 2 mg by mouth 4 (four) times daily as needed for diarrhea or loose stools. , Disp: , Rfl:  .  Multiple Vitamin (MULTIVITAMIN) tablet, Take 1 tablet by mouth daily.  , Disp: , Rfl:  .  ondansetron (ZOFRAN) 4 MG tablet, Take 1 tablet (4 mg total) by mouth every 8 (eight) hours as needed for nausea or vomiting., Disp: 20 tablet, Rfl: 0 .  oxybutynin (DITROPAN) 5 MG tablet, Take 1 tablet (5 mg total) by mouth every 8 (eight) hours as needed for bladder spasms., Disp: 20 tablet, Rfl: 0 .  potassium chloride SA  (K-DUR,KLOR-CON) 20 MEQ tablet, Take 1 tablet (20 mEq total) by mouth daily., Disp: 30 tablet, Rfl: 0 .  prochlorperazine (COMPAZINE) 10 MG tablet, Take 1 tablet (10 mg total) by mouth every 6 (six) hours as needed (Nausea or vomiting)., Disp: 30 tablet, Rfl: 1 .  scopolamine (TRANSDERM-SCOP) 1 MG/3DAYS, Place 1 patch (1.5 mg total) onto the skin every 3 (three) days., Disp: 10 patch, Rfl: 0 .  traMADol (ULTRAM) 50 MG tablet, Take 1 tablet (50 mg total) by mouth every 6 (six) hours as needed., Disp: 60 tablet, Rfl: 0 .  traZODone (DESYREL) 50 MG tablet, Take 0.5-1 tablets (25-50 mg total) by mouth at bedtime as needed for sleep., Disp: 90 tablet, Rfl: 1 .  Ascorbic Acid (VITAMIN C) 100 MG tablet, Take 100 mg by mouth daily.  , Disp: , Rfl:  .  Calcium Carbonate-Vitamin D (CALTRATE 600+D PO), Take 1 tablet by mouth daily., Disp: , Rfl:  .  sulfamethoxazole-trimethoprim (BACTRIM DS,SEPTRA DS) 800-160 MG tablet, Take 1 tablet by mouth 2 (two) times daily. (Patient not taking: Reported on 01/19/2018), Disp: 6 tablet, Rfl: 0 No current facility-administered medications for this visit.   Facility-Administered Medications Ordered in Other Visits:  .  sodium chloride flush (NS) 0.9 % injection 10 mL, 10 mL, Intravenous, PRN, Earlie Server, MD, 10 mL at 01/19/18 0853  Physical exam: ECOG 2 Vitals:   01/19/18 0921  BP: 115/75  Pulse: 90  Resp: 18  Temp: 97.8 F (36.6 C)  TempSrc: Tympanic  Weight: 150 lb 3.2 oz (68.1 kg)   Physical Exam  Constitutional: She is oriented to person, place, and time. No distress.  HENT:  Head: Normocephalic and atraumatic.  Mouth/Throat: Oropharynx is clear and moist.  Eyes: Pupils are equal, round, and reactive to light. EOM are normal. No scleral icterus.  Neck: Normal range of motion. Neck supple.  Cardiovascular: Normal rate, regular rhythm and normal heart sounds.  Pulmonary/Chest: Effort normal. No respiratory distress. She has no wheezes.  Decreased breath sounds  at right lower base, improved.  Abdominal: Soft. Bowel sounds are normal. She exhibits no distension and no mass. There is no tenderness.  Genitourinary:  Genitourinary Comments: Bilateral percutaneous nephrostomy tube, bloodstained urine in the nephrostomy bag.  Musculoskeletal: Normal range of motion. She exhibits no edema or deformity.  Bilateral trace pitting edema, improved  Neurological: She is alert and oriented to person, place, and time. No cranial nerve deficit. Coordination normal.  Skin: Skin is warm and dry. No rash noted. No erythema.  Psychiatric: She has a normal mood and affect. Her behavior is normal. Thought content normal.    CMP Latest Ref Rng & Units 01/19/2018  Glucose 70 - 99 mg/dL 156(H)  BUN 6 - 20 mg/dL 16  Creatinine 0.44 - 1.00 mg/dL 0.84  Sodium 135 - 145 mmol/L 138  Potassium 3.5 - 5.1 mmol/L 3.7  Chloride 98 - 111 mmol/L 107  CO2 22 - 32 mmol/L 25  Calcium 8.9 - 10.3 mg/dL 8.6(L)  Total Protein 6.5 - 8.1 g/dL 6.9  Total Bilirubin 0.3 - 1.2 mg/dL 0.3  Alkaline Phos 38 - 126 U/L 135(H)  AST 15 - 41 U/L 21  ALT 0 - 44 U/L 24   CBC Latest Ref Rng & Units 01/19/2018  WBC 4.0 - 10.5 K/uL 6.5  Hemoglobin 12.0 - 15.0 g/dL 10.1(L)  Hematocrit 36.0 - 46.0 % 31.7(L)  Platelets 150 - 400 K/uL 509(H)    Mr Jeri Cos Wo Contrast  Result Date: 01/03/2018 CLINICAL DATA:  57 year old female with history of metastatic breast cancer. Dizziness. EXAM: MRI HEAD WITHOUT AND WITH CONTRAST TECHNIQUE: Multiplanar, multiecho pulse sequences of the brain and surrounding structures were obtained without and with intravenous contrast. CONTRAST:  3mL MULTIHANCE GADOBENATE DIMEGLUMINE 529 MG/ML IV SOLN COMPARISON:  Brain MRI and intracranial MRA 08/10/2017 FINDINGS: Brain: No abnormal enhancement identified (developmental venous anomaly of the cerebellar vermis, see below). No midline shift, mass effect, or evidence of intracranial mass lesion. No dural thickening. Stable cerebral  volume. Chronic partially empty sella. Stable ventricular prominence. No transependymal edema. No restricted diffusion to suggest acute infarction. No extra-axial collection or acute intracranial hemorrhage. Cervicomedullary junction within normal limits. 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 No other chronic cerebral blood products. Vascular: Major intracranial vascular flow voids are stable. The major dural venous sinuses are enhancing and appear patent. Skull and upper cervical spine: Heterogeneous loss of the normal T1 bone marrow signal in the skull and cervical spine since 2018. No destructive osseous lesion is identified. Negative visible cervical spinal cord. Sinuses/Orbits: Stable and negative. Other: Mastoid air cells are clear. Visible internal auditory structures appear normal. Scalp and face soft tissues appear negative. IMPRESSION: 1. Heterogeneous decreased bone marrow signal since 2018. This could be osseous metastatic  disease, but red marrow reactivation such as in the setting of chemotherapy could also have this appearance. 2. Stable MRI appearance of the brain with no cerebral metastatic disease or acute intracranial abnormality identified. 3. 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 Electronically Signed   By: Genevie Ann M.D.   On: 01/03/2018 17:14   Ir Nephrostomy Exchange Right  Result Date: 01/02/2018 INDICATION: Leaking right-sided nephrostomy catheter. Patient with history of metastatic breast cancer with obstructive bilateral hydronephrosis, failed placement of bilateral internal ureteral stents, for which patient underwent bilateral image guided nephrostomy catheter placement on 12/19/2017. Patient now returns to the Interventional  Radiology department with complaint of leakage surrounding the right-sided percutaneous nephrostomy catheter. The left-sided nephrostomy catheter is functioning well without leakage. EXAM: FLUOROSCOPIC GUIDED RIGHT SIDED NEPHROSTOMY CATHETER EXCHANGE COMPARISON:  Image guided bilateral nephrostomy catheter placement-12/19/2017; CT abdomen and pelvis-12/16/2017 CONTRAST:  15 mL Isovue-300 administered into the right renal collecting system. FLUOROSCOPY TIME:  24 seconds (5 mGy) COMPLICATIONS: None immediate. TECHNIQUE: Informed written consent was obtained from the patient after a discussion of the risks, benefits and alternatives to treatment. Questions regarding the procedure were encouraged and answered. A timeout was performed prior to the initiation of the procedure. The right flank and external portion of existing nephrostomy catheter were prepped and draped in the usual sterile fashion. A sterile drape was applied covering the operative field. Maximum barrier sterile technique with sterile gowns and gloves were used for the procedure. A timeout was performed prior to the initiation of the procedure. A pre procedural spot fluoroscopic image was obtained after contrast was injected via the existing nephrostomy catheter demonstrating appropriate positioning within the renal pelvis. The existing nephrostomy catheter was cut and cannulated with a Benson wire which was coiled within the renal pelvis. Under intermittent fluoroscopic guidance, the existing nephrostomy catheter was exchanged for a new 10.2 [place] all-purpose drainage catheter. Contrast injection confirmed appropriate positioning within the renal pelvis and a post exchange fluoroscopic image was obtained. The catheter was locked and secured to the skin with a suture. A dressing was placed. The patient tolerated the procedure well without immediate postprocedural complication. FINDINGS: Preprocedural spot fluoroscopic image demonstrates unchanged  positioning of right-sided nephrostomy catheter with and coiled and locked overlying expected location right renal pelvis. There is slight resistance to contrast injection of the right-sided nephrostomy catheter however ultimately there was opacification of the right renal collecting system. Note is made nonocclusive filling defect within the central aspect of the right renal pelvis compatible with a blood clot. After successful fluoroscopic guided exchange, the new nephrostomy catheter is coiled and locked within the right renal pelvis. IMPRESSION: Successful fluoroscopic guided exchange of right sided 10.2 French percutaneous nephrostomy catheter. PLAN: - The etiology of the patient's right-sided nephrostomy catheter leakage is favored to be secondary to occlusion of the nephrostomy tube due to blood within the right renal collecting system likely at least partially attributable to the patient's anticoagulated state (patient is on Xarelto for lower extremity DVT). As such, patient was instructed to flush both nephrostomy catheters twice a day with 5-10 cc of saline to ensure patency of both nephrostomy catheters. - routine fluoroscopic guided exchange will be scheduled 2 months following placement (early December). Consideration for conversion of the patient's bilateral nephrostomy catheters to bilateral nephroureteral catheters could be considered at that time as indicated. Electronically Signed   By: Sandi Mariscal M.D.   On: 01/02/2018 12:56     Assessment and plan Patient is  a 57 y.o. female with metastatic breast cancer, visceral crisis present for follow-up prior to chemotherapy treatment. 1. Metastatic breast cancer ([place])   2. Nephrostomy status ([place])   3. Acute deep vein thrombosis (DVT) of proximal vein of left lower extremity ([place])   4. Anemia, unspecified type   5. Generalized edema   6. Encounter for antineoplastic chemotherapy    #Metastatic breast cancer with visceral crisis, currently on  first-line treatment with Taxol. Tolerates well in general. Labs reviewed and discussed with patient.  Counts acceptable to proceed with today's Taxol. She will have labs done and proceed with Taxol next week.  #Febrile illness, resolved. #History of recent acute lower extremity DVT in metastatic cancer setting.  Continue Eliquis 5 mg twice daily. #Bilateral nephrostomy status, draining well.  Patient asked when nephrostomy tube will be removed.  We discussed that we will obtain a CT after she finishes 2 cycles of chemotherapy for further evaluation.   #Weight loss and poor appetite secondary to neoplasm and chemotherapy.  Has been referred to dietitian.Marland Kitchen #Anemia, hemoglobin 10.1.  Stable.  Advised patient to continue taking daily ferrous sulfate 325 mg. Follow-up in 1 week for labs and day 8 Taxol treatment. Follow-up in 2 weeks for labs and assessment prior to day 15 Taxol treatment. We spent sufficient time to discuss many aspect of care, questions were answered to patient's satisfaction. Total face to face encounter time for this patient visit was 25 min. >50% of the time was  spent in counseling and coordination of care.   Earlie Server, MD, PhD Hematology Oncology [facility] Pager- [phone] 01/19/2018

## 2018-01-22 ENCOUNTER — Other Ambulatory Visit: Payer: Self-pay | Admitting: *Deleted

## 2018-01-22 ENCOUNTER — Telehealth: Payer: Self-pay | Admitting: *Deleted

## 2018-01-22 MED ORDER — APIXABAN 5 MG PO TABS: ORAL_TABLET | ORAL | 0 refills | Status: DC

## 2018-01-22 NOTE — Telephone Encounter (Signed)
Per Dr Tasia Catchings, okay to send Refill, sent to [facility] pharmacy

## 2018-01-22 NOTE — Telephone Encounter (Signed)
Patient's husband called to request refill for Eliquis. He would like a call back regarding refill.

## 2018-01-26 ENCOUNTER — Inpatient Hospital Stay: Payer: 59

## 2018-01-26 VITALS — BP 111/69 | HR 91 | Temp 97.8°F | Resp 18 | Wt 148.8 lb

## 2018-01-26 DIAGNOSIS — C50412 Malignant neoplasm of upper-outer quadrant of left female breast: Secondary | ICD-10-CM

## 2018-01-26 DIAGNOSIS — R630 Anorexia: Secondary | ICD-10-CM | POA: Diagnosis not present

## 2018-01-26 DIAGNOSIS — Z7901 Long term (current) use of anticoagulants: Secondary | ICD-10-CM | POA: Diagnosis not present

## 2018-01-26 DIAGNOSIS — Z5111 Encounter for antineoplastic chemotherapy: Secondary | ICD-10-CM | POA: Diagnosis not present

## 2018-01-26 DIAGNOSIS — H538 Other visual disturbances: Secondary | ICD-10-CM | POA: Diagnosis not present

## 2018-01-26 DIAGNOSIS — I824Y2 Acute embolism and thrombosis of unspecified deep veins of left proximal lower extremity: Secondary | ICD-10-CM | POA: Diagnosis not present

## 2018-01-26 DIAGNOSIS — K219 Gastro-esophageal reflux disease without esophagitis: Secondary | ICD-10-CM | POA: Diagnosis not present

## 2018-01-26 DIAGNOSIS — R600 Localized edema: Secondary | ICD-10-CM | POA: Diagnosis not present

## 2018-01-26 DIAGNOSIS — M545 Low back pain: Secondary | ICD-10-CM | POA: Diagnosis not present

## 2018-01-26 DIAGNOSIS — R634 Abnormal weight loss: Secondary | ICD-10-CM | POA: Diagnosis not present

## 2018-01-26 DIAGNOSIS — C50919 Malignant neoplasm of unspecified site of unspecified female breast: Secondary | ICD-10-CM

## 2018-01-26 LAB — CBC WITH DIFFERENTIAL/PLATELET
Abs Immature Granulocytes: 0.05 10*3/uL (ref 0.00–0.07)
BASOS ABS: 0.1 10*3/uL (ref 0.0–0.1)
BASOS PCT: 1 %
EOS ABS: 0.1 10*3/uL (ref 0.0–0.5)
Eosinophils Relative: 1 %
HCT: 31.1 % — ABNORMAL LOW (ref 36.0–46.0)
Hemoglobin: 9.9 g/dL — ABNORMAL LOW (ref 12.0–15.0)
IMMATURE GRANULOCYTES: 1 %
Lymphocytes Relative: 12 %
Lymphs Abs: 1 10*3/uL (ref 0.7–4.0)
MCH: 30.1 pg (ref 26.0–34.0)
MCHC: 31.8 g/dL (ref 30.0–36.0)
MCV: 94.5 fL (ref 80.0–100.0)
Monocytes Absolute: 0.5 10*3/uL (ref 0.1–1.0)
Monocytes Relative: 7 %
NEUTROS PCT: 78 %
NRBC: 0 % (ref 0.0–0.2)
Neutro Abs: 6.1 10*3/uL (ref 1.7–7.7)
PLATELETS: 388 10*3/uL (ref 150–400)
RBC: 3.29 MIL/uL — AB (ref 3.87–5.11)
RDW: 15.5 % (ref 11.5–15.5)
WBC: 7.8 10*3/uL (ref 4.0–10.5)

## 2018-01-26 LAB — COMPREHENSIVE METABOLIC PANEL
ALK PHOS: 116 U/L (ref 38–126)
ALT: 32 U/L (ref 0–44)
ANION GAP: 7 (ref 5–15)
AST: 29 U/L (ref 15–41)
Albumin: 3.6 g/dL (ref 3.5–5.0)
BUN: 20 mg/dL (ref 6–20)
CO2: 25 mmol/L (ref 22–32)
CREATININE: 0.99 mg/dL (ref 0.44–1.00)
Calcium: 8.4 mg/dL — ABNORMAL LOW (ref 8.9–10.3)
Chloride: 107 mmol/L (ref 98–111)
Glucose, Bld: 154 mg/dL — ABNORMAL HIGH (ref 70–99)
Potassium: 3.7 mmol/L (ref 3.5–5.1)
Sodium: 139 mmol/L (ref 135–145)
TOTAL PROTEIN: 7.2 g/dL (ref 6.5–8.1)
Total Bilirubin: 0.2 mg/dL — ABNORMAL LOW (ref 0.3–1.2)

## 2018-01-26 MED ORDER — HEPARIN SOD (PORK) LOCK FLUSH 100 UNIT/ML IV SOLN
500.0000 [IU] | Freq: Once | INTRAVENOUS | Status: AC | PRN
  Administered 2018-01-26: 500 [IU]
  Filled 2018-01-26: qty 5

## 2018-01-26 MED ORDER — FAMOTIDINE IN NACL 20-0.9 MG/50ML-% IV SOLN
20.0000 mg | Freq: Once | INTRAVENOUS | Status: AC
  Administered 2018-01-26: 20 mg via INTRAVENOUS
  Filled 2018-01-26: qty 50

## 2018-01-26 MED ORDER — DEXAMETHASONE SODIUM PHOSPHATE 10 MG/ML IJ SOLN
10.0000 mg | Freq: Once | INTRAMUSCULAR | Status: AC
  Administered 2018-01-26: 10 mg via INTRAVENOUS
  Filled 2018-01-26: qty 1

## 2018-01-26 MED ORDER — SODIUM CHLORIDE 0.9% FLUSH
10.0000 mL | INTRAVENOUS | Status: DC | PRN
  Administered 2018-01-26: 10 mL
  Filled 2018-01-26: qty 10

## 2018-01-26 MED ORDER — DIPHENHYDRAMINE HCL 50 MG/ML IJ SOLN
50.0000 mg | Freq: Once | INTRAMUSCULAR | Status: AC
  Administered 2018-01-26: 50 mg via INTRAVENOUS
  Filled 2018-01-26: qty 1

## 2018-01-26 MED ORDER — SODIUM CHLORIDE 0.9 % IV SOLN
Freq: Once | INTRAVENOUS | Status: AC
  Administered 2018-01-26: 09:00:00 via INTRAVENOUS
  Filled 2018-01-26: qty 250

## 2018-01-26 MED ORDER — SODIUM CHLORIDE 0.9 % IV SOLN
80.0000 mg/m2 | Freq: Once | INTRAVENOUS | Status: AC
  Administered 2018-01-26: 144 mg via INTRAVENOUS
  Filled 2018-01-26: qty 24

## 2018-02-02 ENCOUNTER — Other Ambulatory Visit: Payer: Self-pay

## 2018-02-02 ENCOUNTER — Inpatient Hospital Stay (HOSPITAL_BASED_OUTPATIENT_CLINIC_OR_DEPARTMENT_OTHER): Payer: 59 | Admitting: Oncology

## 2018-02-02 ENCOUNTER — Ambulatory Visit: Payer: 59

## 2018-02-02 ENCOUNTER — Inpatient Hospital Stay: Payer: 59

## 2018-02-02 ENCOUNTER — Encounter: Payer: Self-pay | Admitting: Oncology

## 2018-02-02 VITALS — BP 116/73 | HR 81 | Temp 97.0°F | Resp 18 | Wt 148.3 lb

## 2018-02-02 DIAGNOSIS — Z170 Estrogen receptor positive status [ER+]: Secondary | ICD-10-CM

## 2018-02-02 DIAGNOSIS — Z936 Other artificial openings of urinary tract status: Secondary | ICD-10-CM | POA: Diagnosis not present

## 2018-02-02 DIAGNOSIS — C50411 Malignant neoplasm of upper-outer quadrant of right female breast: Secondary | ICD-10-CM | POA: Diagnosis not present

## 2018-02-02 DIAGNOSIS — C50412 Malignant neoplasm of upper-outer quadrant of left female breast: Secondary | ICD-10-CM

## 2018-02-02 DIAGNOSIS — Z7901 Long term (current) use of anticoagulants: Secondary | ICD-10-CM | POA: Diagnosis not present

## 2018-02-02 DIAGNOSIS — C50919 Malignant neoplasm of unspecified site of unspecified female breast: Secondary | ICD-10-CM

## 2018-02-02 DIAGNOSIS — Z436 Encounter for attention to other artificial openings of urinary tract: Secondary | ICD-10-CM | POA: Diagnosis not present

## 2018-02-02 DIAGNOSIS — Z923 Personal history of irradiation: Secondary | ICD-10-CM

## 2018-02-02 DIAGNOSIS — T451X5A Adverse effect of antineoplastic and immunosuppressive drugs, initial encounter: Secondary | ICD-10-CM

## 2018-02-02 DIAGNOSIS — K219 Gastro-esophageal reflux disease without esophagitis: Secondary | ICD-10-CM

## 2018-02-02 DIAGNOSIS — Z800 Family history of malignant neoplasm of digestive organs: Secondary | ICD-10-CM

## 2018-02-02 DIAGNOSIS — R600 Localized edema: Secondary | ICD-10-CM

## 2018-02-02 DIAGNOSIS — R5381 Other malaise: Secondary | ICD-10-CM

## 2018-02-02 DIAGNOSIS — Z79818 Long term (current) use of other agents affecting estrogen receptors and estrogen levels: Secondary | ICD-10-CM | POA: Diagnosis not present

## 2018-02-02 DIAGNOSIS — R5383 Other fatigue: Secondary | ICD-10-CM | POA: Diagnosis not present

## 2018-02-02 DIAGNOSIS — Z5111 Encounter for antineoplastic chemotherapy: Secondary | ICD-10-CM | POA: Diagnosis not present

## 2018-02-02 DIAGNOSIS — T451X5S Adverse effect of antineoplastic and immunosuppressive drugs, sequela: Secondary | ICD-10-CM | POA: Diagnosis not present

## 2018-02-02 DIAGNOSIS — Z9013 Acquired absence of bilateral breasts and nipples: Secondary | ICD-10-CM

## 2018-02-02 DIAGNOSIS — I824Y2 Acute embolism and thrombosis of unspecified deep veins of left proximal lower extremity: Secondary | ICD-10-CM | POA: Diagnosis not present

## 2018-02-02 DIAGNOSIS — D6481 Anemia due to antineoplastic chemotherapy: Secondary | ICD-10-CM

## 2018-02-02 DIAGNOSIS — C7911 Secondary malignant neoplasm of bladder: Secondary | ICD-10-CM

## 2018-02-02 DIAGNOSIS — M545 Low back pain: Secondary | ICD-10-CM | POA: Diagnosis not present

## 2018-02-02 DIAGNOSIS — Z79899 Other long term (current) drug therapy: Secondary | ICD-10-CM

## 2018-02-02 DIAGNOSIS — R601 Generalized edema: Secondary | ICD-10-CM

## 2018-02-02 DIAGNOSIS — H538 Other visual disturbances: Secondary | ICD-10-CM

## 2018-02-02 DIAGNOSIS — Z803 Family history of malignant neoplasm of breast: Secondary | ICD-10-CM

## 2018-02-02 DIAGNOSIS — R630 Anorexia: Secondary | ICD-10-CM | POA: Diagnosis not present

## 2018-02-02 DIAGNOSIS — C799 Secondary malignant neoplasm of unspecified site: Secondary | ICD-10-CM | POA: Diagnosis not present

## 2018-02-02 DIAGNOSIS — R634 Abnormal weight loss: Secondary | ICD-10-CM | POA: Diagnosis not present

## 2018-02-02 DIAGNOSIS — Z87891 Personal history of nicotine dependence: Secondary | ICD-10-CM

## 2018-02-02 LAB — CBC WITH DIFFERENTIAL/PLATELET
Abs Immature Granulocytes: 0.02 10*3/uL (ref 0.00–0.07)
BASOS ABS: 0 10*3/uL (ref 0.0–0.1)
Basophils Relative: 1 %
Eosinophils Absolute: 0.1 10*3/uL (ref 0.0–0.5)
Eosinophils Relative: 2 %
HEMATOCRIT: 30.9 % — AB (ref 36.0–46.0)
HEMOGLOBIN: 9.6 g/dL — AB (ref 12.0–15.0)
IMMATURE GRANULOCYTES: 0 %
LYMPHS ABS: 0.8 10*3/uL (ref 0.7–4.0)
LYMPHS PCT: 17 %
MCH: 29.6 pg (ref 26.0–34.0)
MCHC: 31.1 g/dL (ref 30.0–36.0)
MCV: 95.4 fL (ref 80.0–100.0)
Monocytes Absolute: 0.3 10*3/uL (ref 0.1–1.0)
Monocytes Relative: 7 %
NEUTROS PCT: 73 %
Neutro Abs: 3.3 10*3/uL (ref 1.7–7.7)
Platelets: 337 10*3/uL (ref 150–400)
RBC: 3.24 MIL/uL — AB (ref 3.87–5.11)
RDW: 15.9 % — AB (ref 11.5–15.5)
WBC: 4.6 10*3/uL (ref 4.0–10.5)
nRBC: 0 % (ref 0.0–0.2)

## 2018-02-02 LAB — COMPREHENSIVE METABOLIC PANEL
ALT: 22 U/L (ref 0–44)
ANION GAP: 5 (ref 5–15)
AST: 21 U/L (ref 15–41)
Albumin: 3.4 g/dL — ABNORMAL LOW (ref 3.5–5.0)
Alkaline Phosphatase: 108 U/L (ref 38–126)
BUN: 14 mg/dL (ref 6–20)
CHLORIDE: 111 mmol/L (ref 98–111)
CO2: 24 mmol/L (ref 22–32)
Calcium: 8.6 mg/dL — ABNORMAL LOW (ref 8.9–10.3)
Creatinine, Ser: 0.87 mg/dL (ref 0.44–1.00)
GFR calc Af Amer: >60 mL/min (ref >60)
GFR calc non Af Amer: >60 mL/min (ref >60)
Glucose, Bld: 118 mg/dL — ABNORMAL HIGH (ref 70–99)
POTASSIUM: 4 mmol/L (ref 3.5–5.1)
SODIUM: 140 mmol/L (ref 135–145)
Total Bilirubin: <0.1 mg/dL — ABNORMAL LOW (ref 0.3–1.2)
Total Protein: 6.5 g/dL (ref 6.5–8.1)

## 2018-02-02 MED ORDER — SODIUM CHLORIDE 0.9 % IV SOLN
Freq: Once | INTRAVENOUS | Status: AC
  Administered 2018-02-02: 09:00:00 via INTRAVENOUS
  Filled 2018-02-02: qty 250

## 2018-02-02 MED ORDER — FAMOTIDINE IN NACL 20-0.9 MG/50ML-% IV SOLN: 20.0000 mg | Freq: Once | INTRAVENOUS | Status: DC

## 2018-02-02 MED ORDER — SODIUM CHLORIDE 0.9 % IV SOLN
80.0000 mg/m2 | Freq: Once | INTRAVENOUS | Status: AC
  Administered 2018-02-02: 144 mg via INTRAVENOUS
  Filled 2018-02-02: qty 24

## 2018-02-02 MED ORDER — SODIUM CHLORIDE 0.9 % IV SOLN
Freq: Once | INTRAVENOUS | Status: AC
  Administered 2018-02-02: 10:00:00 via INTRAVENOUS
  Filled 2018-02-02: qty 50

## 2018-02-02 MED ORDER — HEPARIN SOD (PORK) LOCK FLUSH 100 UNIT/ML IV SOLN
500.0000 [IU] | Freq: Once | INTRAVENOUS | Status: AC | PRN
  Administered 2018-02-02: 500 [IU]
  Filled 2018-02-02: qty 5

## 2018-02-02 MED ORDER — DIPHENHYDRAMINE HCL 50 MG/ML IJ SOLN
50.0000 mg | Freq: Once | INTRAMUSCULAR | Status: AC
  Administered 2018-02-02: 50 mg via INTRAVENOUS
  Filled 2018-02-02: qty 1

## 2018-02-02 MED ORDER — DEXAMETHASONE SODIUM PHOSPHATE 10 MG/ML IJ SOLN
10.0000 mg | Freq: Once | INTRAMUSCULAR | Status: AC
  Administered 2018-02-02: 10 mg via INTRAVENOUS
  Filled 2018-02-02: qty 1

## 2018-02-02 NOTE — Progress Notes (Signed)
Patient here for follow up. Pt continues to complain of blurry vision. Pt started to use magnifying glasses to read. Pt brought advanced directives. Also, they would like for [place] to fax office visit note and labs for today to [place] [facility] [place] [phone]) for long term disability.

## 2018-02-02 NOTE — Progress Notes (Signed)
Hematology/Oncology Follow Up Note [facility]  Telephone:([phone] [phone]  Patient Care Team: [name], MD as PCP - General (Internal Medicine)   Name of the patient: Mary [name]  [id number]  1960-06-06   REASON FOR VISIT  follow-up for management of metastatic breast cancer.  Pertinent oncology history # She was diagnosed with right upper quadrant breast in August 2013, status post neoadjuvant chemotherapy with FEC x4 followed by weekly Taxol x2, underwent bilateral mastectomy [February 2014], right ALND, and left sentinel LN biopsy.  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  Tis N0.  Patient also received adjuvant radiation.   Patient was started initially on tamoxifen plus ovarian suppression with Zoladex in June 2014,  and changed to Aromasin plus Zoladex monthly since June 2015.follows up with Dr.Gudina Loleta Dicker at [facility] in [place]. reports that she twisted and sprained her left ankle when getting out of her car on 12/11/2017.  Ankle has been swollen.  #Patient presented to emergency room due to abnormal lab values.  Calcium level was 13.9 with creatinine 1.63.  Also endorses right lower quadrant/right flank/right lower back pain for the past couple of days.  Also having had episodes of dizziness which were diagnosed as vertigo since April 2019.  She had a negative MRI brain done at that time which were essentially negative for acute findings.  In the emergency room CT renal stone study were independently reviewed by me which showed bilateral hydronephrosis, due to the stricture at the level of UPJ's.  Masslike thickening of the anterior and the lateral bladder wall suspicious for metastatic implant.  Mesenteric, omental, peritoneal implants with small ascites and diffuse mesenteric edema.  Small to moderate bilateral pleural effusion  with associated compressive atelectasis of the lung.  Diffuse small osseous skeletal but stated disease.  Patient was seen and evaluated by urology Dr. Erlene Quan.  She is status post bilateral ureter stent placement and infiltrative bladder mass biopsy. Unfortunately her creatinine increased further during the admission and eventually she got bilateral percutaneous nephrostomy tube placed.  Creatinine gradually gets better after hydration. For her hypercalcemia, patient received 1 dose of calcitonin and 1 dose of Zometa.  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 She has visceral crisis with metastatic breast cancer.  In order to avoid further delay outpatient, she was started on first dose of Taxol 80 mg/m on 12/22/2017 after detailed discussion of chemotherapy side effects and complications.  #Left lower extremity DVT, she was anticoagulated with heparin drip.  Switched to Eliquis 5 mg twice daily outpatient. # Patient previously follows up at [facility] center.  She prefers to follow-up with [place] cancer center as it is closer to her home.  INTERVAL HISTORY 57 year old female with metastatic breast lobular carcinoma presents for assessment prior to first-line palliative chemotherapy Taxol treatments   #Fatigue: "some days more tired than others".  Chronic onset, perisistent, no aggravating or improving factors, no associated symptoms. manageable level for now.  She is able to do some housework at home.  #Continue to have blurry vision.  Last eye exam in June 2019 was normal per patient. She has to use a magnifier to read Recent MRI did not show any acute intracranial changes.  #Bilateral nephrostomy tube placement, draining well.  She also  passes small amount of urine every couple of days. #Lower extremity swelling  resolved.  Takes Lasix as needed.   Appetite is fair.   Review of Systems  Constitutional: Positive for malaise/fatigue and weight loss. Negative for chills and fever.  HENT: Negative for nosebleeds and sore throat.   Eyes: Negative for double vision, photophobia and redness.       Blurry vision  Respiratory: Negative for cough, shortness of breath and wheezing.   Cardiovascular: Negative for chest pain, palpitations, orthopnea and leg swelling.  Gastrointestinal: Negative for abdominal pain, blood in stool, nausea and vomiting.  Genitourinary: Negative for dysuria.       Nephrostomy tube draining well.  Musculoskeletal: Negative for back pain, myalgias and neck pain.  Skin: Negative for itching and rash.  Neurological: Negative for dizziness, tingling and tremors.  Endo/Heme/Allergies: Negative for environmental allergies. Does not bruise/bleed easily.  Psychiatric/Behavioral: Negative for depression.      Allergies  Allergen Reactions  . Adhesive [Tape] Other (See Comments)    Redness:Please use paper tape     Past Medical History:  Diagnosis Date  . Anxiety    takes Xanax prn  . Breast cancer [facility]) August 2013   bilateral, er/pr+  . Complication of anesthesia    pt states she had the shakes extremely bad  . Cough    at night d/t reflux;nothing productive  . Dental crowns present    x 4  . GERD (gastroesophageal reflux disease)    takes Nexium daily  . History of bronchitis    08/2011  . History of colon polyps   . Hot flashes, menopausal 08/08/2012  . IBS (irritable bowel syndrome)    immodium prn  . Insomnia    takes Ambien nightly  . Irritable bowel syndrome   . Neutropenia, drug-induced ([place]) 12/15/2011  . Nocturia   . Plantar fasciitis   . S/P radiation therapy 07/03/2012 through 08/18/2038                                                        Right chest wall/regional lymph nodes 5040 cGy 28 sessions, right chest wall/mastectomy scar boost 1000 cGy 5  sessions                          . Status post chemotherapy    FEC 100 starting on 12/08/2011 - 01/18/12/single agent Taxol x12 weeks from 02/02/2012 through January 2014   . Urinary frequency   . Use of tamoxifen (Nolvadex)   . Vertigo      Past Surgical History:  Procedure Laterality Date  . BREAST LUMPECTOMY  2008   left  . COLONOSCOPY    . COLONOSCOPY WITH PROPOFOL N/A 10/10/2017   Procedure: COLONOSCOPY WITH PROPOFOL;  Surgeon: Lucilla Lame, MD;  [facility];  Service: Endoscopy;  Laterality: N/A;  . CYSTOSCOPY WITH STENT PLACEMENT Bilateral 12/17/2017   Procedure: CYSTOSCOPY WITH STENT PLACEMENT;  Surgeon: Hollice Espy, MD;  Location: [facility] ORS;  Service: Urology;  Laterality: Bilateral;  . IR NEPHROSTOMY EXCHANGE RIGHT  01/02/2018  . IR NEPHROSTOMY PLACEMENT LEFT  12/19/2017  . IR NEPHROSTOMY PLACEMENT RIGHT  12/19/2017  . MASTECTOMY    . MASTECTOMY WITH AXILLARY LYMPH NODE DISSECTION Right 05/18/2012   Procedure: MASTECTOMY WITH AXILLARY LYMPH NODE DISSECTION;  Surgeon: Adin Hector, MD;  Location: [place];  Service: General;  Laterality: Right;  Bilateral Total Mastectomy , right axillary lymph node dissection left axillary sentinel node biopsy, removal of Porta Cath  . PORT-A-CATH REMOVAL N/A 05/18/2012   Procedure: REMOVAL PORT-A-CATH;  Surgeon: Adin Hector, MD;  Location: [place];  Service: General;  Laterality: N/A;  . PORTA CATH INSERTION N/A 12/26/2017   Procedure: PORTA CATH INSERTION;  Surgeon: Katha Cabal, MD;  Location: [place] [facility];  Service: Cardiovascular;  Laterality: N/A;  . PORTACATH PLACEMENT  12/02/2011   Procedure: INSERTION PORT-A-CATH;  Surgeon: Adin Hector, MD;  Location: [place];  Service: General;  Laterality: N/A;  insertion port a cath with fluoroscopy  . SIMPLE MASTECTOMY WITH AXILLARY SENTINEL NODE BIOPSY Left 05/18/2012   Procedure: SIMPLE MASTECTOMY WITH AXILLARY SENTINEL NODE BIOPSY;  Surgeon:  Adin Hector, MD;  Location: [place];  Service: General;  Laterality: Left;    Social History   Socioeconomic History  . Marital status: Married    Spouse name: Not on file  . Number of children: 0  . Years of education: Not on file  . Highest education level: Not on file  Occupational History  . Occupation: Network engineer II    Employer: [employer]  Social Needs  . Financial resource strain: Not on file  . Food insecurity:    Worry: Not on file    Inability: Not on file  . Transportation needs:    Medical: Not on file    Non-medical: Not on file  Tobacco Use  . Smoking status: Former Smoker    Packs/day: 0.50    Years: 15.00    Pack years: 7.50    Types: Cigarettes    Last attempt to quit: 03/21/1997    Years since quitting: 20.8  . Smokeless tobacco: Never Used  Substance and Sexual Activity  . Alcohol use: Not Currently    Alcohol/week: 0.0 standard drinks    Comment: occasionally wine  . Drug use: No  . Sexual activity: Yes    Birth control/protection: None  Lifestyle  . Physical activity:    Days per week: Not on file    Minutes per session: Not on file  . Stress: Not on file  Relationships  . Social connections:    Talks on phone: Not on file    Gets together: Not on file    Attends religious service: Not on file    Active member of club or organization: Not on file    Attends meetings of clubs or organizations: Not on file    Relationship status: Not on file  . Intimate partner violence:    Fear of current or ex partner: Not on file    Emotionally abused: Not on file    Physically abused: Not on file    Forced sexual activity: Not on file  Other Topics Concern  . Not on file  Social History Narrative   Married to [employer]  No children 82 years old with fmp.  20 year use of birth control pills.      Family History  Problem Relation Age of Onset  . Hypertension Mother   . Diabetes Mother   . Hypertension Maternal Grandmother   . Pancreatic  cancer Paternal Grandmother        diagnosed in her 58s  . Ovarian cancer Maternal Aunt        maternal grandmother's sister  . Breast cancer  Paternal Aunt        diagnosed in late 78s early 46s  . Parkinsonism Paternal Grandfather      Current Outpatient Medications:  .  amLODipine (NORVASC) 5 MG tablet, TAKE 1 TABLET BY MOUTH DAILY., Disp: 90 tablet, Rfl: 1 .  apixaban (ELIQUIS) 5 MG TABS tablet, Two tablets twice a day for seven days then one tablet twice a day afterwards, Disp: 60 tablet, Rfl: 0 .  dexamethasone (DECADRON) 4 MG tablet, Take 1 tablet (4 mg total) by mouth See admin instructions. You may take 4 mg daily for 2 days after chemotherapy if needed for nausea, Disp: 30 tablet, Rfl: 0 .  diazepam (VALIUM) 10 MG tablet, Take 1 tablet (10 mg total) by mouth at bedtime as needed for anxiety., Disp: 30 tablet, Rfl: 5 .  esomeprazole (NEXIUM) 40 MG capsule, Take 40 mg by mouth daily before breakfast., Disp: , Rfl:  .  ferrous sulfate 325 (65 FE) MG EC tablet, Take 1 tablet (325 mg total) by mouth daily., Disp: 30 tablet, Rfl: 3 .  furosemide (LASIX) 20 MG tablet, Take 1 tablet (20 mg total) by mouth daily as needed., Disp: 30 tablet, Rfl: 1 .  lidocaine-prilocaine (EMLA) cream, Apply to affected area once, Disp: 30 g, Rfl: 3 .  loperamide (IMODIUM A-D) 2 MG tablet, Take 2 mg by mouth 4 (four) times daily as needed for diarrhea or loose stools. , Disp: , Rfl:  .  Multiple Vitamin (MULTIVITAMIN) tablet, Take 1 tablet by mouth daily.  , Disp: , Rfl:  .  ondansetron (ZOFRAN) 4 MG tablet, Take 1 tablet (4 mg total) by mouth every 8 (eight) hours as needed for nausea or vomiting., Disp: 20 tablet, Rfl: 0 .  oxybutynin (DITROPAN) 5 MG tablet, Take 1 tablet (5 mg total) by mouth every 8 (eight) hours as needed for bladder spasms., Disp: 20 tablet, Rfl: 0 .  potassium chloride SA (K-DUR,KLOR-CON) 20 MEQ tablet, Take 1 tablet (20 mEq total) by mouth daily., Disp: 30 tablet, Rfl: 0 .   prochlorperazine (COMPAZINE) 10 MG tablet, Take 1 tablet (10 mg total) by mouth every 6 (six) hours as needed (Nausea or vomiting)., Disp: 30 tablet, Rfl: 1 .  scopolamine (TRANSDERM-SCOP) 1 MG/3DAYS, Place 1 patch (1.5 mg total) onto the skin every 3 (three) days., Disp: 10 patch, Rfl: 0 .  traMADol (ULTRAM) 50 MG tablet, Take 1 tablet (50 mg total) by mouth every 6 (six) hours as needed., Disp: 60 tablet, Rfl: 0 .  traZODone (DESYREL) 50 MG tablet, Take 0.5-1 tablets (25-50 mg total) by mouth at bedtime as needed for sleep., Disp: 90 tablet, Rfl: 1 .  Ascorbic Acid (VITAMIN C) 100 MG tablet, Take 100 mg by mouth daily.  , Disp: , Rfl:  .  Calcium Carbonate-Vitamin D (CALTRATE 600+D PO), Take 1 tablet by mouth daily., Disp: , Rfl:  .  sulfamethoxazole-trimethoprim (BACTRIM DS,SEPTRA DS) 800-160 MG tablet, Take 1 tablet by mouth 2 (two) times daily. (Patient not taking: Reported on 01/19/2018), Disp: 6 tablet, Rfl: 0  Physical exam: ECOG 1  Vitals:   02/02/18 0846  BP: 116/73  Pulse: 81  Resp: 18  Temp: (!) 97 F (36.1 C)  Weight: 148 lb 4.8 oz (67.3 kg)   Physical Exam  Constitutional: She is oriented to person, place, and time. No distress.  HENT:  Head: Normocephalic and atraumatic.  Mouth/Throat: Oropharynx is clear and moist.  Eyes: Pupils are equal, round, and reactive to light. EOM are  normal. No scleral icterus.  Neck: Normal range of motion. Neck supple.  Cardiovascular: Normal rate, regular rhythm and normal heart sounds.  Pulmonary/Chest: Effort normal. No respiratory distress. She has no wheezes.  Decreased breath sounds at right lower base, improved.  Abdominal: Soft. Bowel sounds are normal. She exhibits no distension and no mass. There is no tenderness.  Genitourinary:  Genitourinary Comments: Bilateral percutaneous nephrostomy tube, bloodstained urine in the nephrostomy bag.  Musculoskeletal: Normal range of motion. She exhibits no edema or deformity.  Trace edema  bilaterally.  Neurological: She is alert and oriented to person, place, and time. No cranial nerve deficit. Coordination normal.  Skin: Skin is warm and dry. No rash noted. No erythema.  Psychiatric: She has a normal mood and affect. Her behavior is normal. Thought content normal.    CMP Latest Ref Rng & Units 01/26/2018  Glucose 70 - 99 mg/dL 154(H)  BUN 6 - 20 mg/dL 20  Creatinine 0.44 - 1.00 mg/dL 0.99  Sodium 135 - 145 mmol/L 139  Potassium 3.5 - 5.1 mmol/L 3.7  Chloride 98 - 111 mmol/L 107  CO2 22 - 32 mmol/L 25  Calcium 8.9 - 10.3 mg/dL 8.4(L)  Total Protein 6.5 - 8.1 g/dL 7.2  Total Bilirubin 0.3 - 1.2 mg/dL 0.2(L)  Alkaline Phos 38 - 126 U/L 116  AST 15 - 41 U/L 29  ALT 0 - 44 U/L 32   CBC Latest Ref Rng & Units 02/02/2018  WBC 4.0 - 10.5 K/uL 4.6  Hemoglobin 12.0 - 15.0 g/dL 9.6(L)  Hematocrit 36.0 - 46.0 % 30.9(L)  Platelets 150 - 400 K/uL 337    Mr Jeri Cos Wo Contrast  Result Date: 01/03/2018 CLINICAL DATA:  57 year old female with history of metastatic breast cancer. Dizziness. EXAM: MRI HEAD WITHOUT AND WITH CONTRAST TECHNIQUE: Multiplanar, multiecho pulse sequences of the brain and surrounding structures were obtained without and with intravenous contrast. CONTRAST:  5mL MULTIHANCE GADOBENATE DIMEGLUMINE 529 MG/ML IV SOLN COMPARISON:  Brain MRI and intracranial MRA 08/10/2017 FINDINGS: Brain: No abnormal enhancement identified (developmental venous anomaly of the cerebellar vermis, see below). No midline shift, mass effect, or evidence of intracranial mass lesion. No dural thickening. Stable cerebral volume. Chronic partially empty sella. Stable ventricular prominence. No transependymal edema. No restricted diffusion to suggest acute infarction. No extra-axial collection or acute intracranial hemorrhage. Cervicomedullary junction within normal limits. 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 No other chronic cerebral blood products. Vascular: Major intracranial vascular flow voids are stable. The major dural venous sinuses are enhancing and appear patent. Skull and upper cervical spine: Heterogeneous loss of the normal T1 bone marrow signal in the skull and cervical spine since 2018. No destructive osseous lesion is identified. Negative visible cervical spinal cord. Sinuses/Orbits: Stable and negative. Other: Mastoid air cells are clear. Visible internal auditory structures appear normal. Scalp and face soft tissues appear negative. IMPRESSION: 1. Heterogeneous decreased bone marrow signal since 2018. This could be osseous metastatic disease, but red marrow reactivation such as in the setting of chemotherapy could also have this appearance. 2. Stable MRI appearance of the brain with no cerebral metastatic disease or acute intracranial abnormality identified. 3. 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 Electronically Signed   By: Genevie Ann M.D.   On: 01/03/2018 17:14  Assessment and plan Patient is a 57 y.o. female with metastatic breast cancer, visceral crisis present for follow-up prior to chemotherapy treatment. 1. Metastatic breast cancer ([place])   2. Generalized edema   3. Nephrostomy status ([place])   4. Encounter for antineoplastic chemotherapy   5. Anemia due to antineoplastic chemotherapy    #Metastatic breast cancer with visceral crisis, currently on first-line treatment with weekly Taxol Day 1,8, 15 Q 28 days Tolerates well, labs are reviewed and discussed with patient and her families.  Counts are acceptable to proceed Cycle 2 Day 15 Taxol today.   Repeat CT chest, CT renal stone study in 1 week for evaluation of treatment response.   # Anemia due to chemotherapy, trending down, continue to  monitor # Recent lower extremity DVT, in metastatic caner setting, continue Eliquis 5mg  BID.  # Bilateral nephrostomy status, draining well.   We spent sufficient time to discuss many aspect of care, questions were answered to patient's satisfaction. Total face to face encounter time for this patient visit was 25 min. >50% of the time was  spent in counseling and coordination of care.   Earlie Server, MD, PhD Hematology Oncology [facility] Pager- [phone] 02/02/2018

## 2018-02-06 ENCOUNTER — Ambulatory Visit
Admission: RE | Admit: 2018-02-06 | Discharge: 2018-02-06 | Disposition: A | Discharge status: Home / Self Care | Payer: 59 | Source: Ambulatory Visit | Attending: Oncology | Admitting: Oncology

## 2018-02-06 DIAGNOSIS — Z936 Other artificial openings of urinary tract status: Secondary | ICD-10-CM | POA: Insufficient documentation

## 2018-02-06 DIAGNOSIS — R188 Other ascites: Secondary | ICD-10-CM | POA: Insufficient documentation

## 2018-02-06 DIAGNOSIS — Z9221 Personal history of antineoplastic chemotherapy: Secondary | ICD-10-CM | POA: Insufficient documentation

## 2018-02-06 DIAGNOSIS — N1339 Other hydronephrosis: Secondary | ICD-10-CM | POA: Diagnosis not present

## 2018-02-06 DIAGNOSIS — C7951 Secondary malignant neoplasm of bone: Secondary | ICD-10-CM | POA: Diagnosis not present

## 2018-02-06 DIAGNOSIS — C7911 Secondary malignant neoplasm of bladder: Secondary | ICD-10-CM | POA: Insufficient documentation

## 2018-02-06 DIAGNOSIS — Z5111 Encounter for antineoplastic chemotherapy: Secondary | ICD-10-CM

## 2018-02-06 DIAGNOSIS — R918 Other nonspecific abnormal finding of lung field: Secondary | ICD-10-CM | POA: Insufficient documentation

## 2018-02-06 DIAGNOSIS — C50919 Malignant neoplasm of unspecified site of unspecified female breast: Secondary | ICD-10-CM | POA: Insufficient documentation

## 2018-02-08 ENCOUNTER — Other Ambulatory Visit: Payer: Self-pay | Admitting: Oncology

## 2018-02-08 DIAGNOSIS — H5340 Unspecified visual field defects: Secondary | ICD-10-CM | POA: Diagnosis not present

## 2018-02-08 NOTE — Progress Notes (Signed)
DISCONTINUE ON PATHWAY REGIMEN - Breast     A cycle is every 28 days.:     Palbociclib      Fulvestrant      Fulvestrant   **Always confirm dose/schedule in your pharmacy ordering system**  REASON: Other Reason PRIOR TREATMENT: [id number]: Ovarian Suppression Followed by Palbociclib 125 mg D1-21 + Fulvestrant 500 mg q28 Days Until Progression or Unacceptable Toxicity  START ON PATHWAY REGIMEN - Breast     A cycle is every 28 days.:     Palbociclib      Fulvestrant      Fulvestrant   **Always confirm dose/schedule in your pharmacy ordering system**  Patient Characteristics: Distant Metastases or Locoregional Recurrent Disease - Unresected or Locally Advanced Unresectable Disease Progressing after Neoadjuvant and Local Therapies, HER2 Negative/Unknown/Equivocal, ER Positive, Endocrine Therapy, Premenopausal, First Line,  Candidate for a CDK4/6 Inhibitor Therapeutic Status: Distant Metastases BRCA Mutation Status: Awaiting Test Results ER Status: Positive (+) HER2 Status: Negative (-) PR Status: Positive (+) Menopausal Status: Premenopausal Line of Therapy: First Line Intent of Therapy: Non-Curative / Palliative Intent, Discussed with Patient

## 2018-02-08 NOTE — Progress Notes (Signed)
DISCONTINUE ON PATHWAY REGIMEN - Breast     A cycle is every 28 days (3 weeks on and 1 week off):     Paclitaxel   **Always confirm dose/schedule in your pharmacy ordering system**  REASON: Other Reason PRIOR TREATMENT: [id number]: Paclitaxel 80 mg/m2 D1, 8, 15 q28 Days Until Progression or Unacceptable Toxicity TREATMENT RESPONSE: Partial Response (PR)  START ON PATHWAY REGIMEN - Breast     A cycle is every 28 days.:     Palbociclib      Fulvestrant      Fulvestrant   **Always confirm dose/schedule in your pharmacy ordering system**  Patient Characteristics: Distant Metastases or Locoregional Recurrent Disease - Unresected or Locally Advanced Unresectable Disease Progressing after Neoadjuvant and Local Therapies, HER2 Negative/Unknown/Equivocal, ER Positive, Endocrine Therapy, Premenopausal, First Line,  Candidate for a CDK4/6 Inhibitor Therapeutic Status: Distant Metastases BRCA Mutation Status: Awaiting Test Results ER Status: Positive (+) HER2 Status: Negative (-) PR Status: Positive (+) Menopausal Status: Premenopausal Line of Therapy: First Line Intent of Therapy: Non-Curative / Palliative Intent, Discussed with Patient

## 2018-02-09 ENCOUNTER — Other Ambulatory Visit: Payer: Self-pay | Admitting: Oncology

## 2018-02-09 ENCOUNTER — Telehealth: Payer: Self-pay | Admitting: Pharmacy Technician

## 2018-02-09 ENCOUNTER — Other Ambulatory Visit: Payer: Self-pay | Admitting: *Deleted

## 2018-02-09 ENCOUNTER — Telehealth: Payer: Self-pay | Admitting: Pharmacist

## 2018-02-09 DIAGNOSIS — C50412 Malignant neoplasm of upper-outer quadrant of left female breast: Secondary | ICD-10-CM

## 2018-02-09 DIAGNOSIS — C50919 Malignant neoplasm of unspecified site of unspecified female breast: Secondary | ICD-10-CM

## 2018-02-09 NOTE — Telephone Encounter (Signed)
Oral Oncology Pharmacist Encounter  Received new prescription for Ibrance (palbociclib) for the treatment of metastatic breast lobular carcinoma ER/PR positive/HER2 negative in conjunction with fulvestrant, planned duration until disease progression or unacceptable drug toxicity.  CMP/CBC from 02/02/18 assessed, no relevant lab abnormalities. Prescription dose and frequency assessed.   Current medication list in Epic reviewed, several DDIs with Ibrance identified: - Ibance can may increase the serum concentration of amlodipine, dexamethasone, diazepam, and trazodone. Monitor patient for adverse effects related to increased concentrations of the above listed medications. No baseline dose adjustment needed.  Prescription has been e-scribed to the [facility] for benefits analysis and approval.  Oral Oncology Clinic will continue to follow for insurance authorization, copayment issues, initial counseling and start date.  Darl Pikes, PharmD, BCPS, [facility] Hematology/Oncology Clinical Pharmacist [facility]/AP Oral [facility] [phone]  02/09/2018 10:08 AM

## 2018-02-09 NOTE — Telephone Encounter (Signed)
Oral Oncology Patient Advocate Encounter  Received notification from [employer] that prior authorization for Leslee Home is required.  PA submitted on CoverMyMeds Key [id number] Status is pending  Oral Oncology Clinic will continue to follow.  [place] Patient [place] Phone [phone] Fax [phone] 02/09/2018 2:08 PM

## 2018-02-12 DIAGNOSIS — H469 Unspecified optic neuritis: Secondary | ICD-10-CM | POA: Diagnosis not present

## 2018-02-12 NOTE — Telephone Encounter (Signed)
Oral Oncology Patient Advocate Encounter  Prior Authorization for Mary [name] has been approved.    PA# [id number] Effective dates: 02/09/18 through 02/09/19  Patients co-pay is $0.00.    Oral Oncology Clinic will continue to follow.   [place] Patient [name] Phone [phone] Fax [phone] 02/12/2018 8:46 AM

## 2018-02-13 ENCOUNTER — Inpatient Hospital Stay: Payer: 59

## 2018-02-13 ENCOUNTER — Other Ambulatory Visit: Payer: Self-pay

## 2018-02-13 ENCOUNTER — Inpatient Hospital Stay (HOSPITAL_BASED_OUTPATIENT_CLINIC_OR_DEPARTMENT_OTHER): Payer: 59 | Admitting: Oncology

## 2018-02-13 ENCOUNTER — Ambulatory Visit
Admission: RE | Admit: 2018-02-13 | Discharge: 2018-02-13 | Disposition: A | Discharge status: Home / Self Care | Payer: 59 | Source: Ambulatory Visit | Attending: Urology | Admitting: Urology

## 2018-02-13 ENCOUNTER — Encounter: Payer: Self-pay | Admitting: Oncology

## 2018-02-13 ENCOUNTER — Ambulatory Visit: Payer: 59 | Admitting: Urology

## 2018-02-13 VITALS — BP 107/73 | HR 97 | Temp 98.0°F | Resp 18 | Wt 145.4 lb

## 2018-02-13 DIAGNOSIS — C50412 Malignant neoplasm of upper-outer quadrant of left female breast: Secondary | ICD-10-CM

## 2018-02-13 DIAGNOSIS — R5381 Other malaise: Secondary | ICD-10-CM

## 2018-02-13 DIAGNOSIS — Z9013 Acquired absence of bilateral breasts and nipples: Secondary | ICD-10-CM

## 2018-02-13 DIAGNOSIS — Z5111 Encounter for antineoplastic chemotherapy: Secondary | ICD-10-CM | POA: Diagnosis not present

## 2018-02-13 DIAGNOSIS — Z936 Other artificial openings of urinary tract status: Secondary | ICD-10-CM | POA: Diagnosis not present

## 2018-02-13 DIAGNOSIS — N1339 Other hydronephrosis: Secondary | ICD-10-CM

## 2018-02-13 DIAGNOSIS — T451X5A Adverse effect of antineoplastic and immunosuppressive drugs, initial encounter: Secondary | ICD-10-CM

## 2018-02-13 DIAGNOSIS — R5383 Other fatigue: Secondary | ICD-10-CM

## 2018-02-13 DIAGNOSIS — Z170 Estrogen receptor positive status [ER+]: Secondary | ICD-10-CM

## 2018-02-13 DIAGNOSIS — Z436 Encounter for attention to other artificial openings of urinary tract: Secondary | ICD-10-CM | POA: Diagnosis not present

## 2018-02-13 DIAGNOSIS — Z803 Family history of malignant neoplasm of breast: Secondary | ICD-10-CM

## 2018-02-13 DIAGNOSIS — C50919 Malignant neoplasm of unspecified site of unspecified female breast: Secondary | ICD-10-CM

## 2018-02-13 DIAGNOSIS — N131 Hydronephrosis with ureteral stricture, not elsewhere classified: Secondary | ICD-10-CM | POA: Insufficient documentation

## 2018-02-13 DIAGNOSIS — M545 Low back pain: Secondary | ICD-10-CM | POA: Diagnosis not present

## 2018-02-13 DIAGNOSIS — C50411 Malignant neoplasm of upper-outer quadrant of right female breast: Secondary | ICD-10-CM | POA: Diagnosis not present

## 2018-02-13 DIAGNOSIS — R634 Abnormal weight loss: Secondary | ICD-10-CM | POA: Diagnosis not present

## 2018-02-13 DIAGNOSIS — Z800 Family history of malignant neoplasm of digestive organs: Secondary | ICD-10-CM

## 2018-02-13 DIAGNOSIS — H538 Other visual disturbances: Secondary | ICD-10-CM

## 2018-02-13 DIAGNOSIS — T451X5S Adverse effect of antineoplastic and immunosuppressive drugs, sequela: Secondary | ICD-10-CM | POA: Diagnosis not present

## 2018-02-13 DIAGNOSIS — Z853 Personal history of malignant neoplasm of breast: Secondary | ICD-10-CM | POA: Diagnosis not present

## 2018-02-13 DIAGNOSIS — Z923 Personal history of irradiation: Secondary | ICD-10-CM | POA: Diagnosis not present

## 2018-02-13 DIAGNOSIS — I824Y2 Acute embolism and thrombosis of unspecified deep veins of left proximal lower extremity: Secondary | ICD-10-CM | POA: Diagnosis not present

## 2018-02-13 DIAGNOSIS — Z79818 Long term (current) use of other agents affecting estrogen receptors and estrogen levels: Secondary | ICD-10-CM | POA: Diagnosis not present

## 2018-02-13 DIAGNOSIS — C7911 Secondary malignant neoplasm of bladder: Secondary | ICD-10-CM | POA: Diagnosis not present

## 2018-02-13 DIAGNOSIS — D6481 Anemia due to antineoplastic chemotherapy: Secondary | ICD-10-CM

## 2018-02-13 DIAGNOSIS — K219 Gastro-esophageal reflux disease without esophagitis: Secondary | ICD-10-CM | POA: Diagnosis not present

## 2018-02-13 DIAGNOSIS — Z7901 Long term (current) use of anticoagulants: Secondary | ICD-10-CM | POA: Diagnosis not present

## 2018-02-13 DIAGNOSIS — R600 Localized edema: Secondary | ICD-10-CM | POA: Diagnosis not present

## 2018-02-13 DIAGNOSIS — Z87891 Personal history of nicotine dependence: Secondary | ICD-10-CM

## 2018-02-13 DIAGNOSIS — Z79899 Other long term (current) drug therapy: Secondary | ICD-10-CM

## 2018-02-13 DIAGNOSIS — R630 Anorexia: Secondary | ICD-10-CM | POA: Diagnosis not present

## 2018-02-13 HISTORY — PX: IR NEPHROSTOMY EXCHANGE RIGHT: IMG6070

## 2018-02-13 HISTORY — PX: IR NEPHROSTOMY EXCHANGE LEFT: IMG6069

## 2018-02-13 LAB — CBC WITH DIFFERENTIAL/PLATELET
ABS IMMATURE GRANULOCYTES: 0.02 10*3/uL (ref 0.00–0.07)
BASOS PCT: 1 %
Basophils Absolute: 0.1 10*3/uL (ref 0.0–0.1)
EOS ABS: 0.1 10*3/uL (ref 0.0–0.5)
Eosinophils Relative: 1 %
HCT: 33.1 % — ABNORMAL LOW (ref 36.0–46.0)
Hemoglobin: 10.5 g/dL — ABNORMAL LOW (ref 12.0–15.0)
IMMATURE GRANULOCYTES: 0 %
Lymphocytes Relative: 13 %
Lymphs Abs: 1 10*3/uL (ref 0.7–4.0)
MCH: 30.3 pg (ref 26.0–34.0)
MCHC: 31.7 g/dL (ref 30.0–36.0)
MCV: 95.7 fL (ref 80.0–100.0)
Monocytes Absolute: 0.7 10*3/uL (ref 0.1–1.0)
Monocytes Relative: 10 %
NEUTROS ABS: 5.6 10*3/uL (ref 1.7–7.7)
Neutrophils Relative %: 75 %
PLATELETS: 379 10*3/uL (ref 150–400)
RBC: 3.46 MIL/uL — AB (ref 3.87–5.11)
RDW: 15.6 % — AB (ref 11.5–15.5)
WBC: 7.5 10*3/uL (ref 4.0–10.5)
nRBC: 0 % (ref 0.0–0.2)

## 2018-02-13 LAB — COMPREHENSIVE METABOLIC PANEL
ALBUMIN: 3.5 g/dL (ref 3.5–5.0)
ALT: 17 U/L (ref 0–44)
ANION GAP: 5 (ref 5–15)
AST: 20 U/L (ref 15–41)
Alkaline Phosphatase: 96 U/L (ref 38–126)
BILIRUBIN TOTAL: 0.3 mg/dL (ref 0.3–1.2)
BUN: 10 mg/dL (ref 6–20)
CO2: 28 mmol/L (ref 22–32)
Calcium: 8.6 mg/dL — ABNORMAL LOW (ref 8.9–10.3)
Chloride: 106 mmol/L (ref 98–111)
Creatinine, Ser: 1.01 mg/dL — ABNORMAL HIGH (ref 0.44–1.00)
GFR calc Af Amer: >60 mL/min (ref >60)
GFR calc non Af Amer: >60 mL/min (ref >60)
GLUCOSE: 154 mg/dL — AB (ref 70–99)
POTASSIUM: 4 mmol/L (ref 3.5–5.1)
Sodium: 139 mmol/L (ref 135–145)
TOTAL PROTEIN: 6.9 g/dL (ref 6.5–8.1)

## 2018-02-13 MED ORDER — PALBOCICLIB 125 MG PO CAPS: 125.0000 mg | ORAL_CAPSULE | Freq: Every day | ORAL | 2 refills | Status: DC

## 2018-02-13 MED ORDER — GOSERELIN ACETATE 3.6 MG ~~LOC~~ IMPL
3.6000 mg | DRUG_IMPLANT | Freq: Once | SUBCUTANEOUS | Status: AC
  Administered 2018-02-13: 3.6 mg via SUBCUTANEOUS
  Filled 2018-02-13: qty 3.6

## 2018-02-13 MED ORDER — FULVESTRANT 250 MG/5ML IM SOLN
500.0000 mg | Freq: Once | INTRAMUSCULAR | Status: AC
  Administered 2018-02-13: 500 mg via INTRAMUSCULAR
  Filled 2018-02-13: qty 10

## 2018-02-13 MED ORDER — LIDOCAINE HCL (PF) 1 % IJ SOLN
INTRAMUSCULAR | Status: AC
  Filled 2018-02-13: qty 30

## 2018-02-13 MED ORDER — IOPAMIDOL (ISOVUE-300) INJECTION 61%
50.0000 mL | Freq: Once | INTRAVENOUS | Status: AC | PRN
  Administered 2018-02-13: 20 mL

## 2018-02-13 MED ORDER — SODIUM CHLORIDE 0.9 % IV SOLN: INTRAVENOUS | Status: DC

## 2018-02-13 NOTE — Progress Notes (Signed)
Patient here for follow up. Pt still complains of blurry vision. It has not gotten worse but it is consistent.

## 2018-02-13 NOTE — Discharge Instructions (Signed)
Percutaneous Nephrostomy, Care After  This sheet gives you information about how to care for yourself after your procedure. Your health care provider may also give you more specific instructions. If you have problems or questions, contact your health care provider.  What can I expect after the procedure?  After the procedure, it is common to have:  · Some soreness where the nephrostomy tube was inserted (tube insertion site).  · Blood-tinged drainage from the nephrostomy tube for the first 24 hours.    Follow these instructions at home:  Activity  · Return to your normal activities as told by your health care provider. Ask your health care provider what activities are safe for you.  · Avoid activities that may cause the nephrostomy tubing to bend.  · Do not take baths, swim, or use a hot tub until your health care provider approves. Ask your health care provider if you can take showers. Cover the nephrostomy tube dressing with a watertight covering when you take a shower.  · Do not drive for 24 hours if you were given a medicine to help you relax (sedative).  Care of the tube insertion site  · Follow instructions from your health care provider about how to take care of your tube insertion site. Make sure you:  ? Wash your hands with soap and water before you change your bandage (dressing). If soap and water are not available, use hand sanitizer.  ? Change your dressing as told by your health care provider. Be careful not to pull on the tube while removing the dressing.  ? When you change the dressing, wash the skin around the tube, rinse well, and pat the skin dry.  · Check the tube insertion area every day for signs of infection. Check for:  ? More redness, swelling, or pain.  ? More fluid or blood.  ? Warmth.  ? Pus or a bad smell.  Care of the nephrostomy tube and drainage bag  · Always keep the tubing, the leg bag, or the bedside drainage bags below the level of the kidney so that your urine drains  freely.  · When connecting your nephrostomy tube to a drainage bag, make sure that there are no kinks in the tubing and that your urine is draining freely. You may want to use an elastic bandage to wrap any exposed tubing that goes from the nephrostomy tube to any of the connecting tubes.  · At night, you may want to connect your nephrostomy tube or the leg bag to a larger bedside drainage bag.  · Follow instructions from your health care provider about how to empty or change the drainage bag.  · Empty the drainage bag when it becomes ? full.  · Replace the drainage bag and any extension tubing that is connected to your nephrostomy tube every 3 weeks or as often as told by your health care provider. Your health care provider will explain how to change the drainage bag and extension tubing.  General instructions  · Take over-the-counter and prescription medicines only as told by your health care provider.  · Keep all follow-up visits as told by your health care provider. This is important.  Contact a health care provider if:  · You have problems with any of the valves or tubing.  · You have persistent pain or soreness in your back.  · You have more redness, swelling, or pain around your tube insertion site.  · You have more fluid or blood coming from   your tube insertion site.  · Your tube insertion site feels warm to the touch.  · You have pus or a bad smell coming from your tube insertion site.  · You have increased urine output or you feel burning when urinating.  Get help right away if:  · You have pain in your abdomen during the first week.  · You have chest pain or have trouble breathing.  · You have a new appearance of blood in your urine.  · You have a fever or chills.  · You have back pain that is not relieved by your medicine.  · You have decreased urine output.  · Your nephrostomy tube comes out.  This information is not intended to replace advice given to you by your health care provider. Make sure you  discuss any questions you have with your health care provider.  Document Released: 10/29/2003 Document Revised: 12/18/2015 Document Reviewed: 12/18/2015  [employer] Interactive Patient Education © 2018 [employer]

## 2018-02-13 NOTE — Progress Notes (Signed)
Hematology/Oncology Follow Up Note [facility]  [phone] [phone]  Patient Care Team: [name], MD as PCP - General (Internal Medicine)   Name of the patient: Mary [name]  [id number]  04/02/1960   REASON FOR VISIT  follow-up for management of metastatic breast cancer.  Pertinent oncology history # She was diagnosed with right upper quadrant breast in August 2013, status post neoadjuvant chemotherapy with FEC x4 followed by weekly Taxol x2, underwent bilateral mastectomy [February 2014], right ALND, and left sentinel LN biopsy.  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  Tis N0.  Patient also received adjuvant radiation.   Patient was started initially on tamoxifen plus ovarian suppression with Zoladex in June 2014,  and changed to Aromasin plus Zoladex monthly since June 2015.follows up with Dr.Gudina Loleta Dicker at [facility] in [place]. reports that she twisted and sprained her left ankle when getting out of her car on 12/11/2017.  Ankle has been swollen.  #Patient presented to emergency room due to abnormal lab values.  Calcium level was 13.9 with creatinine 1.63.  Also endorses right lower quadrant/right flank/right lower back pain for the past couple of days.  Also having had episodes of dizziness which were diagnosed as vertigo since April 2019.  She had a negative MRI brain done at that time which were essentially negative for acute findings.  In the emergency room CT renal stone study were independently reviewed by me which showed bilateral hydronephrosis, due to the stricture at the level of UPJ's.  Masslike thickening of the anterior and the lateral bladder wall suspicious for metastatic implant.  Mesenteric, omental, peritoneal implants with small ascites and diffuse mesenteric edema.  Small to moderate bilateral pleural effusion  with associated compressive atelectasis of the lung.  Diffuse small osseous skeletal but stated disease.  Patient was seen and evaluated by urology Dr. Erlene Quan.  She is status post bilateral ureter stent placement and infiltrative bladder mass biopsy. Unfortunately her creatinine increased further during the admission and eventually she got bilateral percutaneous nephrostomy tube placed.  Creatinine gradually gets better after hydration. For her hypercalcemia, patient received 1 dose of calcitonin and 1 dose of Zometa.  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 She has visceral crisis with metastatic breast cancer.  In order to avoid further delay outpatient, she was started on first dose of Taxol 80 mg/m on 12/22/2017 after detailed discussion of chemotherapy side effects and complications.  #Left lower extremity DVT, she was anticoagulated with heparin drip.  Switched to Eliquis 5 mg twice daily outpatient. # Patient previously follows up at [facility] center.  She prefers to follow-up with [place] [facility] as it is closer to her home.  INTERVAL HISTORY 57 year old female with metastatic breast lobular carcinoma presents for follow-up for management of metastatic lobular breast cancer During the interval patient has had interval CT done for evaluation of treatment response. This morning she also went for nephrostomy tube exchange.  Dr. Kathlene Cote discussed with me via secure chat that both ureters appear.  Suggest trial of nephrostomy tube and patient's husband were to be connected to bags.   #Patient reports fatigue has improved. #Continued to have blurry vision.  She has seen at [facility] and was evaluated by ophthalmologist. She would be  referred to see a expert in [place].  #Appetite is fair.  Review  of Systems  Constitutional: Positive for malaise/fatigue. Negative for chills, fever and weight loss.  HENT: Negative for nosebleeds and sore throat.   Eyes: Negative for double vision, photophobia and redness.       Blurry vision  Respiratory: Negative for cough, shortness of breath and wheezing.   Cardiovascular: Negative for chest pain, palpitations, orthopnea and leg swelling.  Gastrointestinal: Negative for abdominal pain, blood in stool, nausea and vomiting.  Genitourinary: Negative for dysuria.       Nephrostomy tube draining well.  Musculoskeletal: Negative for back pain, myalgias and neck pain.  Skin: Negative for itching and rash.  Neurological: Negative for dizziness, tingling and tremors.  Endo/Heme/Allergies: Negative for environmental allergies. Does not bruise/bleed easily.  Psychiatric/Behavioral: Negative for depression and hallucinations.      Allergies  Allergen Reactions  . Adhesive [Tape] Other (See Comments)    Redness:Please use paper tape     Past Medical History:  Diagnosis Date  . Anxiety    takes Xanax prn  . Breast cancer [facility]) August 2013   bilateral, er/pr+  . Complication of anesthesia    pt states she had the shakes extremely bad  . Cough    at night d/t reflux;nothing productive  . Dental crowns present    x 4  . GERD (gastroesophageal reflux disease)    takes Nexium daily  . History of bronchitis    08/2011  . History of colon polyps   . Hot flashes, menopausal 08/08/2012  . IBS (irritable bowel syndrome)    immodium prn  . Insomnia    takes Ambien nightly  . Irritable bowel syndrome   . Neutropenia, drug-induced ([place]) 12/15/2011  . Nocturia   . Plantar fasciitis   . S/P radiation therapy 07/03/2012 through 08/18/2038                                                        Right chest wall/regional lymph nodes 5040 cGy 28 sessions, right chest wall/mastectomy scar boost 1000 cGy 5 sessions                          . Status post  chemotherapy    FEC 100 starting on 12/08/2011 - 01/18/12/single agent Taxol x12 weeks from 02/02/2012 through January 2014   . Urinary frequency   . Use of tamoxifen (Nolvadex)   . Vertigo      Past Surgical History:  Procedure Laterality Date  . BREAST LUMPECTOMY  2008   left  . COLONOSCOPY    . COLONOSCOPY WITH PROPOFOL N/A 10/10/2017   Procedure: COLONOSCOPY WITH PROPOFOL;  Surgeon: Lucilla Lame, MD;  Location: [facility] ENDOSCOPY;  Service: Endoscopy;  Laterality: N/A;  . CYSTOSCOPY WITH STENT PLACEMENT Bilateral 12/17/2017   Procedure: CYSTOSCOPY WITH STENT PLACEMENT;  Surgeon: Hollice Espy, MD;  Location: [facility] ORS;  Service: Urology;  Laterality: Bilateral;  . IR NEPHROSTOMY EXCHANGE RIGHT  01/02/2018  . IR NEPHROSTOMY PLACEMENT LEFT  12/19/2017  . IR NEPHROSTOMY PLACEMENT RIGHT  12/19/2017  . MASTECTOMY    . MASTECTOMY WITH AXILLARY LYMPH NODE DISSECTION Right 05/18/2012   Procedure: MASTECTOMY WITH AXILLARY LYMPH NODE DISSECTION;  Surgeon: Adin Hector, MD;  Location: [place];  Service:  General;  Laterality: Right;  Bilateral Total Mastectomy , right axillary lymph node dissection left axillary sentinel node biopsy, removal of Porta Cath  . PORT-A-CATH REMOVAL N/A 05/18/2012   Procedure: REMOVAL PORT-A-CATH;  Surgeon: Adin Hector, MD;  Location: [place];  Service: General;  Laterality: N/A;  . PORTA CATH INSERTION N/A 12/26/2017   Procedure: PORTA CATH INSERTION;  Surgeon: Katha Cabal, MD;  Location: [place] CV LAB;  Service: Cardiovascular;  Laterality: N/A;  . PORTACATH PLACEMENT  12/02/2011   Procedure: INSERTION PORT-A-CATH;  Surgeon: Adin Hector, MD;  Location: [place];  Service: General;  Laterality: N/A;  insertion port a cath with fluoroscopy  . SIMPLE MASTECTOMY WITH AXILLARY SENTINEL NODE BIOPSY Left 05/18/2012   Procedure: SIMPLE MASTECTOMY WITH AXILLARY SENTINEL NODE BIOPSY;  Surgeon: Adin Hector, MD;  Location: [place];  Service:  General;  Laterality: Left;    Social History   Socioeconomic History  . Marital status: Married    Spouse name: Not on file  . Number of children: 0  . Years of education: Not on file  . Highest education level: Not on file  Occupational History  . Occupation: Network engineer II    Employer: [employer]  Social Needs  . Financial resource strain: Not on file  . Food insecurity:    Worry: Not on file    Inability: Not on file  . Transportation needs:    Medical: Not on file    Non-medical: Not on file  Tobacco Use  . Smoking status: Former Smoker    Packs/day: 0.50    Years: 15.00    Pack years: 7.50    Types: Cigarettes    Last attempt to quit: 03/21/1997    Years since quitting: 20.9  . Smokeless tobacco: Never Used  Substance and Sexual Activity  . Alcohol use: Not Currently    Alcohol/week: 0.0 standard drinks    Comment: occasionally wine  . Drug use: No  . Sexual activity: Yes    Birth control/protection: None  Lifestyle  . Physical activity:    Days per week: Not on file    Minutes per session: Not on file  . Stress: Not on file  Relationships  . Social connections:    Talks on phone: Not on file    Gets together: Not on file    Attends religious service: Not on file    Active member of club or organization: Not on file    Attends meetings of clubs or organizations: Not on file    Relationship status: Not on file  . Intimate partner violence:    Fear of current or ex partner: Not on file    Emotionally abused: Not on file    Physically abused: Not on file    Forced sexual activity: Not on file  Other Topics Concern  . Not on file  Social History Narrative   Married to [employer]  No children 73 years old with fmp.  20 year use of birth control pills.      Family History  Problem Relation Age of Onset  . Hypertension Mother   . Diabetes Mother   . Hypertension Maternal Grandmother   . Pancreatic cancer Paternal Grandmother        diagnosed in her  83s  . Ovarian cancer Maternal Aunt        maternal grandmother's sister  . Breast cancer Paternal Aunt        diagnosed in  late 51s early 69s  . Parkinsonism Paternal Grandfather      Current Outpatient Medications:  .  amLODipine (NORVASC) 5 MG tablet, TAKE 1 TABLET BY MOUTH DAILY., Disp: 90 tablet, Rfl: 1 .  apixaban (ELIQUIS) 5 MG TABS tablet, Two tablets twice a day for seven days then one tablet twice a day afterwards, Disp: 60 tablet, Rfl: 0 .  Ascorbic Acid (VITAMIN C) 100 MG tablet, Take 100 mg by mouth daily.  , Disp: , Rfl:  .  Calcium Carbonate-Vitamin D (CALTRATE 600+D PO), Take 1 tablet by mouth daily., Disp: , Rfl:  .  dexamethasone (DECADRON) 4 MG tablet, Take 1 tablet (4 mg total) by mouth See admin instructions. You may take 4 mg daily for 2 days after chemotherapy if needed for nausea, Disp: 30 tablet, Rfl: 0 .  diazepam (VALIUM) 10 MG tablet, Take 1 tablet (10 mg total) by mouth at bedtime as needed for anxiety., Disp: 30 tablet, Rfl: 5 .  esomeprazole (NEXIUM) 40 MG capsule, Take 40 mg by mouth daily before breakfast., Disp: , Rfl:  .  ferrous sulfate 325 (65 FE) MG EC tablet, Take 1 tablet (325 mg total) by mouth daily., Disp: 30 tablet, Rfl: 3 .  furosemide (LASIX) 20 MG tablet, Take 1 tablet (20 mg total) by mouth daily as needed., Disp: 30 tablet, Rfl: 1 .  lidocaine-prilocaine (EMLA) cream, Apply to affected area once, Disp: 30 g, Rfl: 3 .  loperamide (IMODIUM A-D) 2 MG tablet, Take 2 mg by mouth 4 (four) times daily as needed for diarrhea or loose stools. , Disp: , Rfl:  .  Multiple Vitamin (MULTIVITAMIN) tablet, Take 1 tablet by mouth daily.  , Disp: , Rfl:  .  ondansetron (ZOFRAN) 4 MG tablet, Take 1 tablet (4 mg total) by mouth every 8 (eight) hours as needed for nausea or vomiting., Disp: 20 tablet, Rfl: 0 .  oxybutynin (DITROPAN) 5 MG tablet, Take 1 tablet (5 mg total) by mouth every 8 (eight) hours as needed for bladder spasms., Disp: 20 tablet, Rfl: 0 .   potassium chloride SA (K-DUR,KLOR-CON) 20 MEQ tablet, Take 1 tablet (20 mEq total) by mouth daily., Disp: 30 tablet, Rfl: 0 .  prochlorperazine (COMPAZINE) 10 MG tablet, Take 1 tablet (10 mg total) by mouth every 6 (six) hours as needed (Nausea or vomiting)., Disp: 30 tablet, Rfl: 1 .  scopolamine (TRANSDERM-SCOP) 1 MG/3DAYS, Place 1 patch (1.5 mg total) onto the skin every 3 (three) days., Disp: 10 patch, Rfl: 0 .  sulfamethoxazole-trimethoprim (BACTRIM DS,SEPTRA DS) 800-160 MG tablet, Take 1 tablet by mouth 2 (two) times daily. (Patient not taking: Reported on 01/19/2018), Disp: 6 tablet, Rfl: 0 .  traMADol (ULTRAM) 50 MG tablet, Take 1 tablet (50 mg total) by mouth every 6 (six) hours as needed., Disp: 60 tablet, Rfl: 0 .  traZODone (DESYREL) 50 MG tablet, Take 0.5-1 tablets (25-50 mg total) by mouth at bedtime as needed for sleep., Disp: 90 tablet, Rfl: 1 No current facility-administered medications for this visit.   Facility-Administered Medications Ordered in Other Visits:  .  0.9 %  sodium chloride infusion, , Intravenous, Continuous, Aletta Edouard, MD  Physical exam: ECOG 1  Vitals:   02/13/18 1005  BP: 107/73  Pulse: 97  Resp: 18  Temp: 98 F (36.7 C)  TempSrc: Tympanic  Weight: 145 lb 6.4 oz (66 kg)   Physical Exam  Constitutional: She is oriented to person, place, and time. No distress.  HENT:  Head: Normocephalic  and atraumatic.  Mouth/Throat: Oropharynx is clear and moist.  Eyes: Pupils are equal, round, and reactive to light. EOM are normal. No scleral icterus.  Neck: Normal range of motion. Neck supple.  Cardiovascular: Normal rate, regular rhythm and normal heart sounds.  Pulmonary/Chest: Effort normal. No respiratory distress. She has no wheezes.  Decreased breath sounds at right lower base, improved.  Abdominal: Soft. Bowel sounds are normal. She exhibits no distension and no mass. There is no tenderness.  Genitourinary:  Genitourinary Comments: Bilateral  percutaneous nephrostomy tube, bloodstained urine in the nephrostomy bag.  Musculoskeletal: Normal range of motion. She exhibits no edema or deformity.  Trace edema bilaterally.  Neurological: She is alert and oriented to person, place, and time. No cranial nerve deficit. Coordination normal.  Skin: Skin is warm and dry. No rash noted. No erythema.  Psychiatric: She has a normal mood and affect.    CMP Latest Ref Rng & Units 02/02/2018  Glucose 70 - 99 mg/dL 118(H)  BUN 6 - 20 mg/dL 14  Creatinine 0.44 - 1.00 mg/dL 0.87  Sodium 135 - 145 mmol/L 140  Potassium 3.5 - 5.1 mmol/L 4.0  Chloride 98 - 111 mmol/L 111  CO2 22 - 32 mmol/L 24  Calcium 8.9 - 10.3 mg/dL 8.6(L)  Total Protein 6.5 - 8.1 g/dL 6.5  Total Bilirubin 0.3 - 1.2 mg/dL <0.1(L)  Alkaline Phos 38 - 126 U/L 108  AST 15 - 41 U/L 21  ALT 0 - 44 U/L 22   CBC Latest Ref Rng & Units 02/02/2018  WBC 4.0 - 10.5 K/uL 4.6  Hemoglobin 12.0 - 15.0 g/dL 9.6(L)  Hematocrit 36.0 - 46.0 % 30.9(L)  Platelets 150 - 400 K/uL 337    Ct Chest Wo Contrast  Result Date: 02/06/2018 CLINICAL DATA:  Renal failure. Recent right percutaneous nephrostomy change. History of breast cancer with chemotherapy. 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 EXAM: CT CHEST, ABDOMEN AND PELVIS WITHOUT CONTRAST TECHNIQUE: Multidetector CT imaging of the chest, abdomen and pelvis was performed following the standard protocol without IV contrast. COMPARISON:  PET-CT 12/01/2011.  Abdominopelvic CT 12/08/2017. FINDINGS: CT CHEST FINDINGS Cardiovascular: Right IJ Port-A-Cath extends to the superior cavoatrial junction. No acute vascular findings are demonstrated on noncontrast imaging. The heart size is normal. There is no pericardial effusion. Mediastinum/Nodes: There are no enlarged mediastinal, hilar, axillary or internal mammary lymph nodes. There are postsurgical changes in both axilla. There is a stable 11 mm low-density right thyroid nodule  on image number 12, unlikely to be clinically significant. The trachea and esophagus appear unremarkable. Lungs/Pleura: Small dependent right pleural effusion. There is new linear right apical opacity on images 21 through 29 of series 3, likely the sequela of prior radiation therapy. There is mild linear scarring or atelectasis at both lung bases. No confluent airspace opacity, suspicious pulmonary nodule or endobronchial lesion. Musculoskeletal/Chest wall: No chest wall mass status post bilateral mastectomy. There are external breast prostheses bilaterally. The bones are diffusely heterogeneous. No focal lytic or blastic osseous lesions are identified. CT ABDOMEN AND PELVIS FINDINGS Hepatobiliary: There are scattered small low-density hepatic lesions which are grossly stable. No definite new or enlarging lesions identified on non-contrast imaging. No evidence of gallstones, gallbladder wall thickening or biliary dilatation. Pancreas: Unremarkable. No pancreatic ductal dilatation or surrounding inflammatory changes. Spleen: Normal in size without focal abnormality. Adrenals/Urinary Tract: Both adrenal glands appear normal. There are bilateral percutaneous nephrostomies which appear well positioned. The previously demonstrated bilateral hydronephrosis and hydroureter  have nearly completely resolved. There is improved perinephric soft tissue stranding bilaterally without focal fluid collection. There is no evidence of urinary tract calculus. The urinary bladder is decompressed without significant change in previously demonstrated left-sided bladder wall thickening. Stomach/Bowel: No evidence of bowel wall thickening, distention or surrounding inflammatory change. There is moderate stool throughout the colon. Vascular/Lymphatic: There are multiple small retroperitoneal and mesenteric lymph nodes which are not pathologically enlarged. There is minimal aortic atherosclerosis without acute vascular findings on noncontrast  imaging. Reproductive: The uterus is retroverted without focal abnormality. There is a stable 3.5 x 2.7 cm right adnexal lesion on image 58/7. The left ovary appears normal. Other: Ascites and mesenteric edema have improved. No increasing peritoneal nodularity identified. Musculoskeletal: There is diffuse marrow heterogeneity with scattered sclerotic lesions, largely stable from remote PET-CT. There is a lucent lesion in the right iliac bone (image 47/7) which demonstrates increased sclerosis since CT of 2 months ago, possibly a treated metastasis. IMPRESSION: 1. Near-complete resolution of bilateral hydronephrosis status post percutaneous nephrostomy. Stable asymmetric bladder wall thickening on the left secondary to known metastatic disease. 2. Ascites and peritoneal nodularity appear mildly improved. Stable right adnexal lesion, potentially a metastasis. 3. Possible treated osseous metastatic disease in the right iliac bone. The bones are heterogeneous without other focal lesions. 4. No definite evidence of thoracic metastatic disease. Electronically Signed   By: Richardean Sale M.D.   On: 02/06/2018 14:42   Ct Renal Stone Study  Result Date: 02/06/2018 CLINICAL DATA:  Renal failure. Recent right percutaneous nephrostomy change. History of breast cancer with chemotherapy. 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 EXAM: CT CHEST, ABDOMEN AND PELVIS WITHOUT CONTRAST TECHNIQUE: Multidetector CT imaging of the chest, abdomen and pelvis was performed following the standard protocol without IV contrast. COMPARISON:  PET-CT 12/01/2011.  Abdominopelvic CT 12/08/2017. FINDINGS: CT CHEST FINDINGS Cardiovascular: Right IJ Port-A-Cath extends to the superior cavoatrial junction. No acute vascular findings are demonstrated on noncontrast imaging. The heart size is normal. There is no pericardial effusion. Mediastinum/Nodes: There are no enlarged mediastinal, hilar, axillary or internal mammary  lymph nodes. There are postsurgical changes in both axilla. There is a stable 11 mm low-density right thyroid nodule on image number 12, unlikely to be clinically significant. The trachea and esophagus appear unremarkable. Lungs/Pleura: Small dependent right pleural effusion. There is new linear right apical opacity on images 21 through 29 of series 3, likely the sequela of prior radiation therapy. There is mild linear scarring or atelectasis at both lung bases. No confluent airspace opacity, suspicious pulmonary nodule or endobronchial lesion. Musculoskeletal/Chest wall: No chest wall mass status post bilateral mastectomy. There are external breast prostheses bilaterally. The bones are diffusely heterogeneous. No focal lytic or blastic osseous lesions are identified. CT ABDOMEN AND PELVIS FINDINGS Hepatobiliary: There are scattered small low-density hepatic lesions which are grossly stable. No definite new or enlarging lesions identified on non-contrast imaging. No evidence of gallstones, gallbladder wall thickening or biliary dilatation. Pancreas: Unremarkable. No pancreatic ductal dilatation or surrounding inflammatory changes. Spleen: Normal in size without focal abnormality. Adrenals/Urinary Tract: Both adrenal glands appear normal. There are bilateral percutaneous nephrostomies which appear well positioned. The previously demonstrated bilateral hydronephrosis and hydroureter have nearly completely resolved. There is improved perinephric soft tissue stranding bilaterally without focal fluid collection. There is no evidence of urinary tract calculus. The urinary bladder is decompressed without significant change in previously demonstrated left-sided bladder wall thickening. Stomach/Bowel: No evidence of bowel wall thickening, distention or  surrounding inflammatory change. There is moderate stool throughout the colon. Vascular/Lymphatic: There are multiple small retroperitoneal and mesenteric lymph nodes which  are not pathologically enlarged. There is minimal aortic atherosclerosis without acute vascular findings on noncontrast imaging. Reproductive: The uterus is retroverted without focal abnormality. There is a stable 3.5 x 2.7 cm right adnexal lesion on image 58/7. The left ovary appears normal. Other: Ascites and mesenteric edema have improved. No increasing peritoneal nodularity identified. Musculoskeletal: There is diffuse marrow heterogeneity with scattered sclerotic lesions, largely stable from remote PET-CT. There is a lucent lesion in the right iliac bone (image 47/7) which demonstrates increased sclerosis since CT of 2 months ago, possibly a treated metastasis. IMPRESSION: 1. Near-complete resolution of bilateral hydronephrosis status post percutaneous nephrostomy. Stable asymmetric bladder wall thickening on the left secondary to known metastatic disease. 2. Ascites and peritoneal nodularity appear mildly improved. Stable right adnexal lesion, potentially a metastasis. 3. Possible treated osseous metastatic disease in the right iliac bone. The bones are heterogeneous without other focal lesions. 4. No definite evidence of thoracic metastatic disease. Electronically Signed   By: Richardean Sale M.D.   On: 02/06/2018 14:42     Assessment and plan Patient is a 57 y.o. female with metastatic breast cancer, visceral crisis present for follow-up prior to chemotherapy treatment. 1. Metastatic breast cancer ([place])   2. Nephrostomy status ([place])   3. Encounter for antineoplastic chemotherapy    #Metastatic breast cancer with visceral crisis,s/p  first-line treatment with 2 cycles of weekly Taxol Day 1,8, 15 Q 28 days Overall tolerated treatment well. Interval CT was independently reviewed and discussed with patient. CT showed mild treatment response. Discussed with patient that I will switch patient to endocrine treatment with Ibrance and fulvestrant plus Zoladex. Rationale of Ibrance/Fulvestrant/Zoladex  and potential side effects discussed with patient and her husband.  Hold a chemo at this point. Leslee Home will be started after her lumbar puncture.  #Anemia due to chemotherapy, hemoglobin improved. #Lower extremity DVT, below knee, 12/16/2017 Currently on Eliquis 5 mg twice daily.  Tolerates well.  Continue.  I advised patient to hold Eliquis for 48 hours prior to lumbar puncture procedure and Resume anticoagulation 48 hours after procedure.   # Bilateral nephrostomy status, draining well.  Discussed with Dr. Kathlene Cote.  We will try capping nephrostomy tubes after Thanksgiving considering the fact that cancer center is closed and can't monitor labs.  Advise patient to cap tubes on 11/30 and have bmp done on 12/2. Patient was also educated about warning signs of renal obstruction including flank pain, lack of urine production, etc.   # Blurry vision, she has been evaluated by Dr. Jeni Salles recently. I had phone conversation with Dr.Dingledine and he will refer patient to see expert in [place].   We spent sufficient time to discuss many aspect of care, questions were answered to patient's satisfaction. Total face to face encounter time for this patient visit was 25 min. >50% of the time was  spent in counseling and coordination of care.  Earlie Server, MD, PhD Hematology Oncology [facility] at [facility] Pager- [phone] 02/13/2018

## 2018-02-19 ENCOUNTER — Inpatient Hospital Stay: Payer: 59 | Attending: Oncology

## 2018-02-19 ENCOUNTER — Ambulatory Visit: Payer: 59 | Admitting: Oncology

## 2018-02-19 ENCOUNTER — Ambulatory Visit: Payer: 59

## 2018-02-19 DIAGNOSIS — R51 Headache: Secondary | ICD-10-CM | POA: Insufficient documentation

## 2018-02-19 DIAGNOSIS — R509 Fever, unspecified: Secondary | ICD-10-CM | POA: Insufficient documentation

## 2018-02-19 DIAGNOSIS — Z9221 Personal history of antineoplastic chemotherapy: Secondary | ICD-10-CM | POA: Insufficient documentation

## 2018-02-19 DIAGNOSIS — M545 Low back pain: Secondary | ICD-10-CM | POA: Diagnosis not present

## 2018-02-19 DIAGNOSIS — R42 Dizziness and giddiness: Secondary | ICD-10-CM | POA: Insufficient documentation

## 2018-02-19 DIAGNOSIS — C7911 Secondary malignant neoplasm of bladder: Secondary | ICD-10-CM | POA: Diagnosis not present

## 2018-02-19 DIAGNOSIS — R7989 Other specified abnormal findings of blood chemistry: Secondary | ICD-10-CM | POA: Insufficient documentation

## 2018-02-19 DIAGNOSIS — C50411 Malignant neoplasm of upper-outer quadrant of right female breast: Secondary | ICD-10-CM | POA: Diagnosis not present

## 2018-02-19 DIAGNOSIS — N390 Urinary tract infection, site not specified: Secondary | ICD-10-CM | POA: Insufficient documentation

## 2018-02-19 DIAGNOSIS — T451X5S Adverse effect of antineoplastic and immunosuppressive drugs, sequela: Secondary | ICD-10-CM | POA: Diagnosis not present

## 2018-02-19 DIAGNOSIS — Z86718 Personal history of other venous thrombosis and embolism: Secondary | ICD-10-CM | POA: Insufficient documentation

## 2018-02-19 DIAGNOSIS — Z923 Personal history of irradiation: Secondary | ICD-10-CM | POA: Insufficient documentation

## 2018-02-19 DIAGNOSIS — Z8041 Family history of malignant neoplasm of ovary: Secondary | ICD-10-CM | POA: Insufficient documentation

## 2018-02-19 DIAGNOSIS — Z7901 Long term (current) use of anticoagulants: Secondary | ICD-10-CM | POA: Insufficient documentation

## 2018-02-19 DIAGNOSIS — K219 Gastro-esophageal reflux disease without esophagitis: Secondary | ICD-10-CM | POA: Insufficient documentation

## 2018-02-19 DIAGNOSIS — Z79818 Long term (current) use of other agents affecting estrogen receptors and estrogen levels: Secondary | ICD-10-CM | POA: Insufficient documentation

## 2018-02-19 DIAGNOSIS — F419 Anxiety disorder, unspecified: Secondary | ICD-10-CM | POA: Insufficient documentation

## 2018-02-19 DIAGNOSIS — R5381 Other malaise: Secondary | ICD-10-CM | POA: Insufficient documentation

## 2018-02-19 DIAGNOSIS — Z170 Estrogen receptor positive status [ER+]: Secondary | ICD-10-CM | POA: Insufficient documentation

## 2018-02-19 DIAGNOSIS — M898X9 Other specified disorders of bone, unspecified site: Secondary | ICD-10-CM | POA: Diagnosis not present

## 2018-02-19 DIAGNOSIS — I959 Hypotension, unspecified: Secondary | ICD-10-CM | POA: Diagnosis not present

## 2018-02-19 DIAGNOSIS — R112 Nausea with vomiting, unspecified: Secondary | ICD-10-CM | POA: Insufficient documentation

## 2018-02-19 DIAGNOSIS — R5383 Other fatigue: Secondary | ICD-10-CM | POA: Insufficient documentation

## 2018-02-19 DIAGNOSIS — D6481 Anemia due to antineoplastic chemotherapy: Secondary | ICD-10-CM | POA: Insufficient documentation

## 2018-02-19 DIAGNOSIS — Z803 Family history of malignant neoplasm of breast: Secondary | ICD-10-CM | POA: Insufficient documentation

## 2018-02-19 DIAGNOSIS — I10 Essential (primary) hypertension: Secondary | ICD-10-CM | POA: Insufficient documentation

## 2018-02-19 DIAGNOSIS — C7949 Secondary malignant neoplasm of other parts of nervous system: Secondary | ICD-10-CM | POA: Diagnosis not present

## 2018-02-19 DIAGNOSIS — Z9013 Acquired absence of bilateral breasts and nipples: Secondary | ICD-10-CM | POA: Insufficient documentation

## 2018-02-19 DIAGNOSIS — J90 Pleural effusion, not elsewhere classified: Secondary | ICD-10-CM | POA: Diagnosis not present

## 2018-02-19 DIAGNOSIS — N136 Pyonephrosis: Secondary | ICD-10-CM | POA: Insufficient documentation

## 2018-02-19 DIAGNOSIS — H538 Other visual disturbances: Secondary | ICD-10-CM | POA: Insufficient documentation

## 2018-02-19 DIAGNOSIS — Z79899 Other long term (current) drug therapy: Secondary | ICD-10-CM | POA: Insufficient documentation

## 2018-02-19 DIAGNOSIS — Z87891 Personal history of nicotine dependence: Secondary | ICD-10-CM | POA: Insufficient documentation

## 2018-02-19 DIAGNOSIS — Z936 Other artificial openings of urinary tract status: Secondary | ICD-10-CM | POA: Insufficient documentation

## 2018-02-19 DIAGNOSIS — C50412 Malignant neoplasm of upper-outer quadrant of left female breast: Secondary | ICD-10-CM

## 2018-02-19 DIAGNOSIS — C50919 Malignant neoplasm of unspecified site of unspecified female breast: Secondary | ICD-10-CM

## 2018-02-19 LAB — COMPREHENSIVE METABOLIC PANEL
ALT: 21 U/L (ref 0–44)
AST: 19 U/L (ref 15–41)
Albumin: 3.4 g/dL — ABNORMAL LOW (ref 3.5–5.0)
Alkaline Phosphatase: 104 U/L (ref 38–126)
Anion gap: 9 (ref 5–15)
BILIRUBIN TOTAL: 0.3 mg/dL (ref 0.3–1.2)
BUN: 12 mg/dL (ref 6–20)
CO2: 28 mmol/L (ref 22–32)
CREATININE: 1.05 mg/dL — AB (ref 0.44–1.00)
Calcium: 8.8 mg/dL — ABNORMAL LOW (ref 8.9–10.3)
Chloride: 105 mmol/L (ref 98–111)
GFR calc Af Amer: >60 mL/min (ref >60)
GFR, EST NON AFRICAN AMERICAN: 59 mL/min — AB (ref >60)
Glucose, Bld: 130 mg/dL — ABNORMAL HIGH (ref 70–99)
Potassium: 3.7 mmol/L (ref 3.5–5.1)
Sodium: 142 mmol/L (ref 135–145)
TOTAL PROTEIN: 7.2 g/dL (ref 6.5–8.1)

## 2018-02-19 LAB — CBC WITH DIFFERENTIAL/PLATELET
Abs Immature Granulocytes: 0.02 10*3/uL (ref 0.00–0.07)
Basophils Absolute: 0.1 10*3/uL (ref 0.0–0.1)
Basophils Relative: 1 %
Eosinophils Absolute: 0.1 10*3/uL (ref 0.0–0.5)
Eosinophils Relative: 1 %
HCT: 33.4 % — ABNORMAL LOW (ref 36.0–46.0)
HEMOGLOBIN: 10.6 g/dL — AB (ref 12.0–15.0)
Immature Granulocytes: 0 %
LYMPHS PCT: 11 %
Lymphs Abs: 1.1 10*3/uL (ref 0.7–4.0)
MCH: 29.9 pg (ref 26.0–34.0)
MCHC: 31.7 g/dL (ref 30.0–36.0)
MCV: 94.4 fL (ref 80.0–100.0)
MONO ABS: 1.1 10*3/uL — AB (ref 0.1–1.0)
Monocytes Relative: 11 %
Neutro Abs: 7.5 10*3/uL (ref 1.7–7.7)
Neutrophils Relative %: 76 %
Platelets: 443 10*3/uL — ABNORMAL HIGH (ref 150–400)
RBC: 3.54 MIL/uL — AB (ref 3.87–5.11)
RDW: 15.5 % (ref 11.5–15.5)
WBC: 9.8 10*3/uL (ref 4.0–10.5)
nRBC: 0 % (ref 0.0–0.2)

## 2018-02-20 ENCOUNTER — Ambulatory Visit: Payer: 59

## 2018-02-21 ENCOUNTER — Encounter: Payer: Self-pay | Admitting: Oncology

## 2018-02-21 ENCOUNTER — Other Ambulatory Visit: Payer: Self-pay

## 2018-02-21 ENCOUNTER — Inpatient Hospital Stay (HOSPITAL_BASED_OUTPATIENT_CLINIC_OR_DEPARTMENT_OTHER): Payer: 59 | Admitting: Oncology

## 2018-02-21 ENCOUNTER — Other Ambulatory Visit: Payer: 59

## 2018-02-21 ENCOUNTER — Other Ambulatory Visit: Payer: Self-pay | Admitting: Oncology

## 2018-02-21 ENCOUNTER — Inpatient Hospital Stay: Payer: 59

## 2018-02-21 VITALS — BP 91/59 | HR 89 | Temp 98.7°F | Wt 145.0 lb

## 2018-02-21 DIAGNOSIS — J90 Pleural effusion, not elsewhere classified: Secondary | ICD-10-CM

## 2018-02-21 DIAGNOSIS — Z79818 Long term (current) use of other agents affecting estrogen receptors and estrogen levels: Secondary | ICD-10-CM

## 2018-02-21 DIAGNOSIS — R5383 Other fatigue: Secondary | ICD-10-CM

## 2018-02-21 DIAGNOSIS — C50411 Malignant neoplasm of upper-outer quadrant of right female breast: Secondary | ICD-10-CM | POA: Diagnosis not present

## 2018-02-21 DIAGNOSIS — Z170 Estrogen receptor positive status [ER+]: Secondary | ICD-10-CM | POA: Diagnosis not present

## 2018-02-21 DIAGNOSIS — R509 Fever, unspecified: Secondary | ICD-10-CM

## 2018-02-21 DIAGNOSIS — N136 Pyonephrosis: Secondary | ICD-10-CM | POA: Diagnosis not present

## 2018-02-21 DIAGNOSIS — Z7901 Long term (current) use of anticoagulants: Secondary | ICD-10-CM

## 2018-02-21 DIAGNOSIS — Z86718 Personal history of other venous thrombosis and embolism: Secondary | ICD-10-CM

## 2018-02-21 DIAGNOSIS — M545 Low back pain: Secondary | ICD-10-CM

## 2018-02-21 DIAGNOSIS — Z9013 Acquired absence of bilateral breasts and nipples: Secondary | ICD-10-CM | POA: Diagnosis not present

## 2018-02-21 DIAGNOSIS — R5381 Other malaise: Secondary | ICD-10-CM

## 2018-02-21 DIAGNOSIS — Z79899 Other long term (current) drug therapy: Secondary | ICD-10-CM

## 2018-02-21 DIAGNOSIS — H538 Other visual disturbances: Secondary | ICD-10-CM

## 2018-02-21 DIAGNOSIS — Z936 Other artificial openings of urinary tract status: Secondary | ICD-10-CM

## 2018-02-21 DIAGNOSIS — R42 Dizziness and giddiness: Secondary | ICD-10-CM

## 2018-02-21 DIAGNOSIS — Z9221 Personal history of antineoplastic chemotherapy: Secondary | ICD-10-CM | POA: Diagnosis not present

## 2018-02-21 DIAGNOSIS — Z87891 Personal history of nicotine dependence: Secondary | ICD-10-CM

## 2018-02-21 DIAGNOSIS — C7911 Secondary malignant neoplasm of bladder: Secondary | ICD-10-CM

## 2018-02-21 DIAGNOSIS — K219 Gastro-esophageal reflux disease without esophagitis: Secondary | ICD-10-CM

## 2018-02-21 DIAGNOSIS — C50412 Malignant neoplasm of upper-outer quadrant of left female breast: Secondary | ICD-10-CM

## 2018-02-21 DIAGNOSIS — N3000 Acute cystitis without hematuria: Secondary | ICD-10-CM

## 2018-02-21 DIAGNOSIS — C7949 Secondary malignant neoplasm of other parts of nervous system: Secondary | ICD-10-CM | POA: Diagnosis not present

## 2018-02-21 DIAGNOSIS — I10 Essential (primary) hypertension: Secondary | ICD-10-CM

## 2018-02-21 DIAGNOSIS — Z923 Personal history of irradiation: Secondary | ICD-10-CM | POA: Diagnosis not present

## 2018-02-21 DIAGNOSIS — Z803 Family history of malignant neoplasm of breast: Secondary | ICD-10-CM

## 2018-02-21 DIAGNOSIS — Z8041 Family history of malignant neoplasm of ovary: Secondary | ICD-10-CM

## 2018-02-21 DIAGNOSIS — Z95828 Presence of other vascular implants and grafts: Secondary | ICD-10-CM

## 2018-02-21 DIAGNOSIS — I959 Hypotension, unspecified: Secondary | ICD-10-CM

## 2018-02-21 DIAGNOSIS — F419 Anxiety disorder, unspecified: Secondary | ICD-10-CM

## 2018-02-21 LAB — CBC WITH DIFFERENTIAL/PLATELET
ABS IMMATURE GRANULOCYTES: 0.08 10*3/uL — AB (ref 0.00–0.07)
Basophils Absolute: 0.1 10*3/uL (ref 0.0–0.1)
Basophils Relative: 0 %
Eosinophils Absolute: 0.1 10*3/uL (ref 0.0–0.5)
Eosinophils Relative: 1 %
HCT: 30.6 % — ABNORMAL LOW (ref 36.0–46.0)
HEMOGLOBIN: 9.7 g/dL — AB (ref 12.0–15.0)
Immature Granulocytes: 1 %
LYMPHS ABS: 0.7 10*3/uL (ref 0.7–4.0)
LYMPHS PCT: 5 %
MCH: 29.8 pg (ref 26.0–34.0)
MCHC: 31.7 g/dL (ref 30.0–36.0)
MCV: 93.9 fL (ref 80.0–100.0)
MONO ABS: 1.3 10*3/uL — AB (ref 0.1–1.0)
Monocytes Relative: 9 %
NEUTROS ABS: 12.9 10*3/uL — AB (ref 1.7–7.7)
Neutrophils Relative %: 84 %
Platelets: 408 10*3/uL — ABNORMAL HIGH (ref 150–400)
RBC: 3.26 MIL/uL — AB (ref 3.87–5.11)
RDW: 15.6 % — ABNORMAL HIGH (ref 11.5–15.5)
WBC: 15.2 10*3/uL — AB (ref 4.0–10.5)
nRBC: 0 % (ref 0.0–0.2)

## 2018-02-21 LAB — COMPREHENSIVE METABOLIC PANEL
ALBUMIN: 3.1 g/dL — AB (ref 3.5–5.0)
ALT: 22 U/L (ref 0–44)
AST: 18 U/L (ref 15–41)
Alkaline Phosphatase: 94 U/L (ref 38–126)
Anion gap: 10 (ref 5–15)
BUN: 10 mg/dL (ref 6–20)
CHLORIDE: 104 mmol/L (ref 98–111)
CO2: 24 mmol/L (ref 22–32)
Calcium: 7.9 mg/dL — ABNORMAL LOW (ref 8.9–10.3)
Creatinine, Ser: 0.91 mg/dL (ref 0.44–1.00)
GFR calc Af Amer: >60 mL/min (ref >60)
GFR calc non Af Amer: >60 mL/min (ref >60)
GLUCOSE: 127 mg/dL — AB (ref 70–99)
POTASSIUM: 3.4 mmol/L — AB (ref 3.5–5.1)
SODIUM: 138 mmol/L (ref 135–145)
Total Bilirubin: 0.5 mg/dL (ref 0.3–1.2)
Total Protein: 7.1 g/dL (ref 6.5–8.1)

## 2018-02-21 LAB — URINALYSIS, COMPLETE (UACMP) WITH MICROSCOPIC
Bilirubin Urine: NEGATIVE
Glucose, UA: NEGATIVE mg/dL
KETONES UR: NEGATIVE mg/dL
Nitrite: NEGATIVE
PROTEIN: 100 mg/dL — AB
RBC / HPF: >50 RBC/hpf — ABNORMAL HIGH (ref 0–5)
Specific Gravity, Urine: 1.019 (ref 1.005–1.030)
WBC, UA: >50 WBC/hpf — ABNORMAL HIGH (ref 0–5)
pH: 5 (ref 5.0–8.0)

## 2018-02-21 MED ORDER — SODIUM CHLORIDE 0.9 % IV SOLN
Freq: Once | INTRAVENOUS | Status: AC
  Administered 2018-02-21: 11:00:00 via INTRAVENOUS
  Filled 2018-02-21: qty 100

## 2018-02-21 MED ORDER — DEXTROSE 5 % IV SOLN: 1.0000 g | INTRAVENOUS | Status: DC

## 2018-02-21 MED ORDER — HEPARIN SOD (PORK) LOCK FLUSH 100 UNIT/ML IV SOLN
500.0000 [IU] | Freq: Once | INTRAVENOUS | Status: AC
  Administered 2018-02-21: 500 [IU] via INTRAVENOUS

## 2018-02-21 MED ORDER — SODIUM CHLORIDE 0.9% FLUSH
10.0000 mL | INTRAVENOUS | Status: DC | PRN
  Administered 2018-02-21: 10 mL via INTRAVENOUS
  Filled 2018-02-21: qty 10

## 2018-02-21 MED ORDER — SODIUM CHLORIDE 0.9 % IV SOLN
Freq: Once | INTRAVENOUS | Status: AC
  Administered 2018-02-21: 11:00:00 via INTRAVENOUS
  Filled 2018-02-21: qty 250

## 2018-02-21 NOTE — Progress Notes (Signed)
Patient states that she woke at 1:00am with temp of 102, took 600mg  of tylenol at that time & another 600mg  at 7:30am.

## 2018-02-21 NOTE — Progress Notes (Signed)
Hematology/Oncology Follow Up Note [facility]  Telephone:([phone] [phone]  Patient Care Team: [name], MD as PCP - General (Internal Medicine)   Name of the patient: Mary [name]  [id number]  Oct 15, 1960   REASON FOR VISIT  follow-up for management of metastatic breast cancer.  Pertinent oncology history # She was diagnosed with right upper quadrant breast in August 2013, status post neoadjuvant chemotherapy with FEC x4 followed by weekly Taxol x2, underwent bilateral mastectomy [February 2014], right ALND, and left sentinel LN biopsy.  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  Tis N0.  Patient also received adjuvant radiation.   Patient was started initially on tamoxifen plus ovarian suppression with Zoladex in June 2014,  and changed to Aromasin plus Zoladex monthly since June 2015.follows up with Dr.Gudina Loleta Dicker at [facility] in [place]. reports that she twisted and sprained her left ankle when getting out of her car on 12/11/2017.  Ankle has been swollen.  #Patient presented to emergency room due to abnormal lab values.  Calcium level was 13.9 with creatinine 1.63.  Also endorses right lower quadrant/right flank/right lower back pain for the past couple of days.  Also having had episodes of dizziness which were diagnosed as vertigo since April 2019.  She had a negative MRI brain done at that time which were essentially negative for acute findings.  In the emergency room CT renal stone study were independently reviewed by me which showed bilateral hydronephrosis, due to the stricture at the level of UPJ's.  Masslike thickening of the anterior and the lateral bladder wall suspicious for metastatic implant.  Mesenteric, omental, peritoneal implants with small ascites and diffuse mesenteric edema.  Small to moderate bilateral pleural effusion  with associated compressive atelectasis of the lung.  Diffuse small osseous skeletal but stated disease.  Patient was seen and evaluated by urology Dr. Erlene Quan.  She is status post bilateral ureter stent placement and infiltrative bladder mass biopsy. Unfortunately her creatinine increased further during the admission and eventually she got bilateral percutaneous nephrostomy tube placed.  Creatinine gradually gets better after hydration. For her hypercalcemia, patient received 1 dose of calcitonin and 1 dose of Zometa.  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 She has visceral crisis with metastatic breast cancer.  In order to avoid further delay outpatient, she was started on first dose of Taxol 80 mg/m on 12/22/2017 after detailed discussion of chemotherapy side effects and complications.  #Left lower extremity DVT, she was anticoagulated with heparin drip.  Switched to Eliquis 5 mg twice daily outpatient. # Patient previously follows up at [facility] center.  She prefers to follow-up with [place] [facility] as it is closer to her home.  INTERVAL HISTORY 57 year old female with metastatic breast lobular carcinoma presents for evaluation of fever.  Patient woke up this morning with fever of 102. Not associated with nausea, vomiting, flank pain, cough or chest pain.  She has capped her nephrostomy tubes on 02/17/2018  #Appetite is fair.  Review of Systems  Constitutional: Positive for malaise/fatigue. Negative for chills, fever and weight loss.  HENT: Negative for nosebleeds and sore throat.   Eyes: Negative for double vision, photophobia and redness.       Blurry vision  Respiratory: Negative for cough, shortness of breath  and wheezing.   Cardiovascular: Negative for chest pain, palpitations, orthopnea and leg  swelling.  Gastrointestinal: Negative for abdominal pain, blood in stool, nausea and vomiting.  Genitourinary: Negative for dysuria, flank pain, frequency and urgency.       Nephrostomy tube capped.   Musculoskeletal: Negative for back pain, myalgias and neck pain.  Skin: Negative for itching and rash.  Neurological: Negative for dizziness, tingling and tremors.  Endo/Heme/Allergies: Negative for environmental allergies. Does not bruise/bleed easily.  Psychiatric/Behavioral: Negative for depression and hallucinations.      Allergies  Allergen Reactions  . Adhesive [Tape] Other (See Comments)    Redness:Please use paper tape     Past Medical History:  Diagnosis Date  . Anxiety    takes Xanax prn  . Breast cancer [facility]) August 2013   bilateral, er/pr+  . Complication of anesthesia    pt states she had the shakes extremely bad  . Cough    at night d/t reflux;nothing productive  . Dental crowns present    x 4  . GERD (gastroesophageal reflux disease)    takes Nexium daily  . History of bronchitis    08/2011  . History of colon polyps   . Hot flashes, menopausal 08/08/2012  . IBS (irritable bowel syndrome)    immodium prn  . Insomnia    takes Ambien nightly  . Irritable bowel syndrome   . Neutropenia, drug-induced ([place]) 12/15/2011  . Nocturia   . Plantar fasciitis   . S/P radiation therapy 07/03/2012 through 08/18/2038                                                        Right chest wall/regional lymph nodes 5040 cGy 28 sessions, right chest wall/mastectomy scar boost 1000 cGy 5 sessions                          . Status post chemotherapy    FEC 100 starting on 12/08/2011 - 01/18/12/single agent Taxol x12 weeks from 02/02/2012 through January 2014   . Urinary frequency   . Use of tamoxifen (Nolvadex)   . Vertigo      Past Surgical History:  Procedure Laterality Date  . BREAST LUMPECTOMY  2008   left  . COLONOSCOPY    . COLONOSCOPY WITH PROPOFOL N/A 10/10/2017    Procedure: COLONOSCOPY WITH PROPOFOL;  Surgeon: Lucilla Lame, MD;  Location: [facility] ENDOSCOPY;  Service: Endoscopy;  Laterality: N/A;  . CYSTOSCOPY WITH STENT PLACEMENT Bilateral 12/17/2017   Procedure: CYSTOSCOPY WITH STENT PLACEMENT;  Surgeon: Hollice Espy, MD;  Location: [facility] ORS;  Service: Urology;  Laterality: Bilateral;  . IR NEPHROSTOMY EXCHANGE LEFT  02/13/2018  . IR NEPHROSTOMY EXCHANGE RIGHT  01/02/2018  . IR NEPHROSTOMY EXCHANGE RIGHT  02/13/2018  . IR NEPHROSTOMY PLACEMENT LEFT  12/19/2017  . IR NEPHROSTOMY PLACEMENT RIGHT  12/19/2017  . MASTECTOMY    . MASTECTOMY WITH AXILLARY LYMPH NODE DISSECTION Right 05/18/2012   Procedure: MASTECTOMY WITH AXILLARY LYMPH NODE DISSECTION;  Surgeon: Adin Hector, MD;  Location: [place];  Service: General;  Laterality: Right;  Bilateral Total Mastectomy , right axillary lymph node dissection left axillary sentinel node biopsy, removal of Porta Cath  . PORT-A-CATH REMOVAL N/A 05/18/2012   Procedure: REMOVAL PORT-A-CATH;  Surgeon: Adin Hector, MD;  Location: [place];  Service: General;  Laterality: N/A;  . PORTA CATH INSERTION N/A 12/26/2017   Procedure: PORTA CATH INSERTION;  Surgeon: Katha Cabal, MD;  Location: [place] [facility];  Service: Cardiovascular;  Laterality: N/A;  . PORTACATH PLACEMENT  12/02/2011   Procedure: INSERTION PORT-A-CATH;  Surgeon: Adin Hector, MD;  Location: [place];  Service: General;  Laterality: N/A;  insertion port a cath with fluoroscopy  . SIMPLE MASTECTOMY WITH AXILLARY SENTINEL NODE BIOPSY Left 05/18/2012   Procedure: SIMPLE MASTECTOMY WITH AXILLARY SENTINEL NODE BIOPSY;  Surgeon: Adin Hector, MD;  Location: [place];  Service: General;  Laterality: Left;    Social History   Socioeconomic History  . Marital status: Married    Spouse name: Not on file  . Number of children: 0  . Years of education: Not on file  . Highest education level: Not on file  Occupational History  .  Occupation: Network engineer II    Employer: [employer]  Social Needs  . Financial resource strain: Not on file  . Food insecurity:    Worry: Not on file    Inability: Not on file  . Transportation needs:    Medical: Not on file    Non-medical: Not on file  Tobacco Use  . Smoking status: Former Smoker    Packs/day: 0.50    Years: 15.00    Pack years: 7.50    Types: Cigarettes    Last attempt to quit: 03/21/1997    Years since quitting: 20.9  . Smokeless tobacco: Never Used  Substance and Sexual Activity  . Alcohol use: Not Currently    Alcohol/week: 0.0 standard drinks    Comment: occasionally wine  . Drug use: No  . Sexual activity: Yes    Birth control/protection: None  Lifestyle  . Physical activity:    Days per week: Not on file    Minutes per session: Not on file  . Stress: Not on file  Relationships  . Social connections:    Talks on phone: Not on file    Gets together: Not on file    Attends religious service: Not on file    Active member of club or organization: Not on file    Attends meetings of clubs or organizations: Not on file    Relationship status: Not on file  . Intimate partner violence:    Fear of current or ex partner: Not on file    Emotionally abused: Not on file    Physically abused: Not on file    Forced sexual activity: Not on file  Other Topics Concern  . Not on file  Social History Narrative   Married to [employer]  No children 34 years old with fmp.  20 year use of birth control pills.      Family History  Problem Relation Age of Onset  . Hypertension Mother   . Diabetes Mother   . Hypertension Maternal Grandmother   . Pancreatic cancer Paternal Grandmother        diagnosed in her 55s  . Ovarian cancer Maternal Aunt        maternal grandmother's sister  . Breast cancer Paternal Aunt        diagnosed in late 39s early 73s  . Parkinsonism Paternal Grandfather      Current Outpatient Medications:  .  amLODipine (NORVASC) 5 MG  tablet, TAKE 1 TABLET BY MOUTH DAILY., Disp: 90 tablet, Rfl: 1 .  diazepam (VALIUM) 10  MG tablet, Take 1 tablet (10 mg total) by mouth at bedtime as needed for anxiety., Disp: 30 tablet, Rfl: 5 .  ferrous sulfate 325 (65 FE) MG EC tablet, Take 1 tablet (325 mg total) by mouth daily., Disp: 30 tablet, Rfl: 3 .  Multiple Vitamin (MULTIVITAMIN) tablet, Take 1 tablet by mouth daily.  , Disp: , Rfl:  .  ondansetron (ZOFRAN) 4 MG tablet, Take 1 tablet (4 mg total) by mouth every 8 (eight) hours as needed for nausea or vomiting., Disp: 20 tablet, Rfl: 0 .  prochlorperazine (COMPAZINE) 10 MG tablet, Take 1 tablet (10 mg total) by mouth every 6 (six) hours as needed (Nausea or vomiting)., Disp: 30 tablet, Rfl: 1 .  traMADol (ULTRAM) 50 MG tablet, Take 1 tablet (50 mg total) by mouth every 6 (six) hours as needed., Disp: 60 tablet, Rfl: 0 .  apixaban (ELIQUIS) 5 MG TABS tablet, Two tablets twice a day for seven days then one tablet twice a day afterwards (Patient not taking: Reported on 02/21/2018), Disp: 60 tablet, Rfl: 0 .  Ascorbic Acid (VITAMIN C) 100 MG tablet, Take 100 mg by mouth daily.  , Disp: , Rfl:  .  Calcium Carbonate-Vitamin D (CALTRATE 600+D PO), Take 1 tablet by mouth daily., Disp: , Rfl:  .  dexamethasone (DECADRON) 4 MG tablet, Take 1 tablet (4 mg total) by mouth See admin instructions. You may take 4 mg daily for 2 days after chemotherapy if needed for nausea (Patient not taking: Reported on 02/21/2018), Disp: 30 tablet, Rfl: 0 .  esomeprazole (NEXIUM) 40 MG capsule, Take 40 mg by mouth daily before breakfast., Disp: , Rfl:  .  furosemide (LASIX) 20 MG tablet, Take 1 tablet (20 mg total) by mouth daily as needed. (Patient not taking: Reported on 02/21/2018), Disp: 30 tablet, Rfl: 1 .  lidocaine-prilocaine (EMLA) cream, Apply to affected area once (Patient not taking: Reported on 02/21/2018), Disp: 30 g, Rfl: 3 .  loperamide (IMODIUM A-D) 2 MG tablet, Take 2 mg by mouth 4 (four) times daily as  needed for diarrhea or loose stools. , Disp: , Rfl:  .  oxybutynin (DITROPAN) 5 MG tablet, Take 1 tablet (5 mg total) by mouth every 8 (eight) hours as needed for bladder spasms. (Patient not taking: Reported on 02/21/2018), Disp: 20 tablet, Rfl: 0 .  palbociclib (IBRANCE) 125 MG capsule, Take 1 capsule (125 mg total) by mouth daily. Take whole with food. Take for 21 days on, 7 days off, repeat every 28 days. (Patient not taking: Reported on 02/21/2018), Disp: 21 capsule, Rfl: 2 .  potassium chloride SA (K-DUR,KLOR-CON) 20 MEQ tablet, Take 1 tablet (20 mEq total) by mouth daily. (Patient not taking: Reported on 02/21/2018), Disp: 30 tablet, Rfl: 0 .  scopolamine (TRANSDERM-SCOP) 1 MG/3DAYS, Place 1 patch (1.5 mg total) onto the skin every 3 (three) days. (Patient not taking: Reported on 02/21/2018), Disp: 10 patch, Rfl: 0 .  traZODone (DESYREL) 50 MG tablet, Take 0.5-1 tablets (25-50 mg total) by mouth at bedtime as needed for sleep. (Patient not taking: Reported on 02/21/2018), Disp: 90 tablet, Rfl: 1 No current facility-administered medications for this visit.   Facility-Administered Medications Ordered in Other Visits:  .  heparin lock flush 100 unit/mL, 500 Units, Intravenous, Once, Earlie Server, MD .  sodium chloride flush (NS) 0.9 % injection 10 mL, 10 mL, Intravenous, PRN, Earlie Server, MD  Physical exam: ECOG 1  Vitals:   02/21/18 1008  BP: (!) 91/59  Pulse: 89  Temp: 98.7 F (37.1 C)  TempSrc: Tympanic  Weight: 145 lb (65.8 kg)   Physical Exam  Constitutional: She is oriented to person, place, and time. No distress.  HENT:  Head: Normocephalic and atraumatic.  Mouth/Throat: Oropharynx is clear and moist.  Eyes: Pupils are equal, round, and reactive to light. EOM are normal. No scleral icterus.  Neck: Normal range of motion. Neck supple.  Cardiovascular: Normal rate, regular rhythm and normal heart sounds.  Pulmonary/Chest: Effort normal. No respiratory distress. She has no wheezes.    Decreased breath sounds at right lower base, improved.  Abdominal: Soft. Bowel sounds are normal. She exhibits no distension and no mass. There is no tenderness.  Genitourinary:  Genitourinary Comments: Bilateral percutaneous nephrostomy tubes capped. Sites have no signs of infection.   Musculoskeletal: Normal range of motion. She exhibits no edema or deformity.  Neurological: She is alert and oriented to person, place, and time. No cranial nerve deficit. Coordination normal.  Skin: Skin is warm and dry. No rash noted. No erythema.  Psychiatric: She has a normal mood and affect. Her behavior is normal. Thought content normal.    CMP Latest Ref Rng & Units 02/21/2018  Glucose 70 - 99 mg/dL 127(H)  BUN 6 - 20 mg/dL 10  Creatinine 0.44 - 1.00 mg/dL 0.91  Sodium 135 - 145 mmol/L 138  Potassium 3.5 - 5.1 mmol/L 3.4(L)  Chloride 98 - 111 mmol/L 104  CO2 22 - 32 mmol/L 24  Calcium 8.9 - 10.3 mg/dL 7.9(L)  Total Protein 6.5 - 8.1 g/dL 7.1  Total Bilirubin 0.3 - 1.2 mg/dL 0.5  Alkaline Phos 38 - 126 U/L 94  AST 15 - 41 U/L 18  ALT 0 - 44 U/L 22   CBC Latest Ref Rng & Units 02/21/2018  WBC 4.0 - 10.5 K/uL 15.2(H)  Hemoglobin 12.0 - 15.0 g/dL 9.7(L)  Hematocrit 36.0 - 46.0 % 30.6(L)  Platelets 150 - 400 K/uL 408(H)    Ct Chest Wo Contrast  Result Date: 02/06/2018 CLINICAL DATA:  Renal failure. Recent right percutaneous nephrostomy change. History of breast cancer with chemotherapy. 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 EXAM: CT CHEST, ABDOMEN AND PELVIS WITHOUT CONTRAST TECHNIQUE: Multidetector CT imaging of the chest, abdomen and pelvis was performed following the standard protocol without IV contrast. COMPARISON:  PET-CT 12/01/2011.  Abdominopelvic CT 12/08/2017. FINDINGS: CT CHEST FINDINGS Cardiovascular: Right IJ Port-A-Cath extends to the superior cavoatrial junction. No acute vascular findings are demonstrated on noncontrast imaging. The heart size is  normal. There is no pericardial effusion. Mediastinum/Nodes: There are no enlarged mediastinal, hilar, axillary or internal mammary lymph nodes. There are postsurgical changes in both axilla. There is a stable 11 mm low-density right thyroid nodule on image number 12, unlikely to be clinically significant. The trachea and esophagus appear unremarkable. Lungs/Pleura: Small dependent right pleural effusion. There is new linear right apical opacity on images 21 through 29 of series 3, likely the sequela of prior radiation therapy. There is mild linear scarring or atelectasis at both lung bases. No confluent airspace opacity, suspicious pulmonary nodule or endobronchial lesion. Musculoskeletal/Chest wall: No chest wall mass status post bilateral mastectomy. There are external breast prostheses bilaterally. The bones are diffusely heterogeneous. No focal lytic or blastic osseous lesions are identified. CT ABDOMEN AND PELVIS FINDINGS Hepatobiliary: There are scattered small low-density hepatic lesions which are grossly stable. No definite new or enlarging lesions identified on non-contrast imaging. No evidence of gallstones, gallbladder wall thickening or biliary  dilatation. Pancreas: Unremarkable. No pancreatic ductal dilatation or surrounding inflammatory changes. Spleen: Normal in size without focal abnormality. Adrenals/Urinary Tract: Both adrenal glands appear normal. There are bilateral percutaneous nephrostomies which appear well positioned. The previously demonstrated bilateral hydronephrosis and hydroureter have nearly completely resolved. There is improved perinephric soft tissue stranding bilaterally without focal fluid collection. There is no evidence of urinary tract calculus. The urinary bladder is decompressed without significant change in previously demonstrated left-sided bladder wall thickening. Stomach/Bowel: No evidence of bowel wall thickening, distention or surrounding inflammatory change. There is  moderate stool throughout the colon. Vascular/Lymphatic: There are multiple small retroperitoneal and mesenteric lymph nodes which are not pathologically enlarged. There is minimal aortic atherosclerosis without acute vascular findings on noncontrast imaging. Reproductive: The uterus is retroverted without focal abnormality. There is a stable 3.5 x 2.7 cm right adnexal lesion on image 58/7. The left ovary appears normal. Other: Ascites and mesenteric edema have improved. No increasing peritoneal nodularity identified. Musculoskeletal: There is diffuse marrow heterogeneity with scattered sclerotic lesions, largely stable from remote PET-CT. There is a lucent lesion in the right iliac bone (image 47/7) which demonstrates increased sclerosis since CT of 2 months ago, possibly a treated metastasis. IMPRESSION: 1. Near-complete resolution of bilateral hydronephrosis status post percutaneous nephrostomy. Stable asymmetric bladder wall thickening on the left secondary to known metastatic disease. 2. Ascites and peritoneal nodularity appear mildly improved. Stable right adnexal lesion, potentially a metastasis. 3. Possible treated osseous metastatic disease in the right iliac bone. The bones are heterogeneous without other focal lesions. 4. No definite evidence of thoracic metastatic disease. Electronically Signed   By: Richardean Sale M.D.   On: 02/06/2018 14:42   Ct Renal Stone Study  Result Date: 02/06/2018 CLINICAL DATA:  Renal failure. Recent right percutaneous nephrostomy change. History of breast cancer with chemotherapy. 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 EXAM: CT CHEST, ABDOMEN AND PELVIS WITHOUT CONTRAST TECHNIQUE: Multidetector CT imaging of the chest, abdomen and pelvis was performed following the standard protocol without IV contrast. COMPARISON:  PET-CT 12/01/2011.  Abdominopelvic CT 12/08/2017. FINDINGS: CT CHEST FINDINGS Cardiovascular: Right IJ Port-A-Cath extends to the  superior cavoatrial junction. No acute vascular findings are demonstrated on noncontrast imaging. The heart size is normal. There is no pericardial effusion. Mediastinum/Nodes: There are no enlarged mediastinal, hilar, axillary or internal mammary lymph nodes. There are postsurgical changes in both axilla. There is a stable 11 mm low-density right thyroid nodule on image number 12, unlikely to be clinically significant. The trachea and esophagus appear unremarkable. Lungs/Pleura: Small dependent right pleural effusion. There is new linear right apical opacity on images 21 through 29 of series 3, likely the sequela of prior radiation therapy. There is mild linear scarring or atelectasis at both lung bases. No confluent airspace opacity, suspicious pulmonary nodule or endobronchial lesion. Musculoskeletal/Chest wall: No chest wall mass status post bilateral mastectomy. There are external breast prostheses bilaterally. The bones are diffusely heterogeneous. No focal lytic or blastic osseous lesions are identified. CT ABDOMEN AND PELVIS FINDINGS Hepatobiliary: There are scattered small low-density hepatic lesions which are grossly stable. No definite new or enlarging lesions identified on non-contrast imaging. No evidence of gallstones, gallbladder wall thickening or biliary dilatation. Pancreas: Unremarkable. No pancreatic ductal dilatation or surrounding inflammatory changes. Spleen: Normal in size without focal abnormality. Adrenals/Urinary Tract: Both adrenal glands appear normal. There are bilateral percutaneous nephrostomies which appear well positioned. The previously demonstrated bilateral hydronephrosis and hydroureter have nearly completely resolved. There is improved  perinephric soft tissue stranding bilaterally without focal fluid collection. There is no evidence of urinary tract calculus. The urinary bladder is decompressed without significant change in previously demonstrated left-sided bladder wall  thickening. Stomach/Bowel: No evidence of bowel wall thickening, distention or surrounding inflammatory change. There is moderate stool throughout the colon. Vascular/Lymphatic: There are multiple small retroperitoneal and mesenteric lymph nodes which are not pathologically enlarged. There is minimal aortic atherosclerosis without acute vascular findings on noncontrast imaging. Reproductive: The uterus is retroverted without focal abnormality. There is a stable 3.5 x 2.7 cm right adnexal lesion on image 58/7. The left ovary appears normal. Other: Ascites and mesenteric edema have improved. No increasing peritoneal nodularity identified. Musculoskeletal: There is diffuse marrow heterogeneity with scattered sclerotic lesions, largely stable from remote PET-CT. There is a lucent lesion in the right iliac bone (image 47/7) which demonstrates increased sclerosis since CT of 2 months ago, possibly a treated metastasis. IMPRESSION: 1. Near-complete resolution of bilateral hydronephrosis status post percutaneous nephrostomy. Stable asymmetric bladder wall thickening on the left secondary to known metastatic disease. 2. Ascites and peritoneal nodularity appear mildly improved. Stable right adnexal lesion, potentially a metastasis. 3. Possible treated osseous metastatic disease in the right iliac bone. The bones are heterogeneous without other focal lesions. 4. No definite evidence of thoracic metastatic disease. Electronically Signed   By: Richardean Sale M.D.   On: 02/06/2018 14:42   Ir Nephrostomy Exchange Left  Result Date: 02/13/2018 INDICATION: History of breast carcinoma with prior bilateral percutaneous nephrostomy tube placement for ureteral obstruction and hydronephrosis. EXAM: BILATERAL PERCUTANEOUS NEPHROSTOMY TUBE EXCHANGE COMPARISON:  None. MEDICATIONS: None ANESTHESIA/SEDATION: None CONTRAST:  20 mL Isovue-300-administered into the collecting system(s) FLUOROSCOPY TIME:  Fluoroscopy Time: 1 minutes and 6  seconds. COMPLICATIONS: None immediate. PROCEDURE: Informed written consent was obtained from the patient after a thorough discussion of the procedural risks, benefits and alternatives. All questions were addressed. Maximal Sterile Barrier Technique was utilized including caps, mask, sterile gowns, sterile gloves, sterile drape, hand hygiene and skin antiseptic. A timeout was performed prior to the initiation of the procedure. The left 8.5 French nephrostomy tube was injected with contrast material. The catheter was cut and removed over a guidewire. A new nephrostomy tube was advanced over the wire. This tube was injected with contrast material and ureteral patency assessed to the level of the bladder. The tube was flushed and connected to a new gravity drainage bag. The right 10 French nephrostomy tube was injected with contrast material. The catheter was cut and removed over a guidewire. A new nephrostomy tube was advanced over the wire. This tube was injected with contrast material and ureteral patency assessed to the level of the bladder. The tube was flushed and connected to a new gravity drainage bag. Both tubes were secured at the skin with Prolene retention sutures. FINDINGS: Both nephrostomy tubes were exchanged and formed at the level of the renal pelvis bilaterally. Nephrostogram spine laterally now demonstrate patent ureters to the level of the bladder without evidence of obstruction. Pending on course of current cancer treatment and renal function, a trial of capping of the nephrostomy tubes could be considered bilaterally prior to potential removal. IMPRESSION: Bilateral nephrostomy tube exchange, as above. Both tubes were reconnected to gravity drainage bags today. Nephrostograms now demonstrate patent ureters to the level of the bladder bilaterally without evidence of obstruction. Depending on current cancer treatment plan and renal function, a trial of capping of the nephrostomy tubes can be  considered bilaterally prior to potential  removal. Electronically Signed   By: Aletta Edouard M.D.   On: 02/13/2018 10:10   Ir Nephrostomy Exchange Right  Result Date: 02/13/2018 INDICATION: History of breast carcinoma with prior bilateral percutaneous nephrostomy tube placement for ureteral obstruction and hydronephrosis. EXAM: BILATERAL PERCUTANEOUS NEPHROSTOMY TUBE EXCHANGE COMPARISON:  None. MEDICATIONS: None ANESTHESIA/SEDATION: None CONTRAST:  20 mL Isovue-300-administered into the collecting system(s) FLUOROSCOPY TIME:  Fluoroscopy Time: 1 minutes and 6 seconds. COMPLICATIONS: None immediate. PROCEDURE: Informed written consent was obtained from the patient after a thorough discussion of the procedural risks, benefits and alternatives. All questions were addressed. Maximal Sterile Barrier Technique was utilized including caps, mask, sterile gowns, sterile gloves, sterile drape, hand hygiene and skin antiseptic. A timeout was performed prior to the initiation of the procedure. The left 8.5 French nephrostomy tube was injected with contrast material. The catheter was cut and removed over a guidewire. A new nephrostomy tube was advanced over the wire. This tube was injected with contrast material and ureteral patency assessed to the level of the bladder. The tube was flushed and connected to a new gravity drainage bag. The right 10 French nephrostomy tube was injected with contrast material. The catheter was cut and removed over a guidewire. A new nephrostomy tube was advanced over the wire. This tube was injected with contrast material and ureteral patency assessed to the level of the bladder. The tube was flushed and connected to a new gravity drainage bag. Both tubes were secured at the skin with Prolene retention sutures. FINDINGS: Both nephrostomy tubes were exchanged and formed at the level of the renal pelvis bilaterally. Nephrostogram spine laterally now demonstrate patent ureters to the level of  the bladder without evidence of obstruction. Pending on course of current cancer treatment and renal function, a trial of capping of the nephrostomy tubes could be considered bilaterally prior to potential removal. IMPRESSION: Bilateral nephrostomy tube exchange, as above. Both tubes were reconnected to gravity drainage bags today. Nephrostograms now demonstrate patent ureters to the level of the bladder bilaterally without evidence of obstruction. Depending on current cancer treatment plan and renal function, a trial of capping of the nephrostomy tubes can be considered bilaterally prior to potential removal. Electronically Signed   By: Aletta Edouard M.D.   On: 02/13/2018 10:10     Assessment and plan Patient is a 57 y.o. female with metastatic breast cancer, visceral crisis present for follow-up prior to chemotherapy treatment. 1. Febrile illness   2. Acute cystitis without hematuria   3. Hypotension, unspecified hypotension type     # Labs are reviewed and discussed with patient.  Creatinine function has been stable since nephrostomy tube being capped.  UA positive for large amount of leukocytes, nitrate negative. ? UTI.  Given her neprhostomuy tube status with recent tube exchange, will treat empyrically for presumed UTI with IV Ceftriaxone daily x 3 days.  Blood culture and urine culture pending.  Due to her febrile illness, will hold her lumbar puncture until she finished antibiotic course.  Reschedule to next week.  She may resume Eliquis for now, and follow same holding instruction prior to lumbar puncture.  # Hypotension, hold anti hypertensive.  Will give one liter of normal saline.   We spent sufficient time to discuss many aspect of care, questions were answered to patient's satisfaction. Total face to face encounter time for this patient visit was 25 min. >50% of the time was  spent in counseling and coordination of care.  Earlie Server, MD, PhD Hematology Oncology  [facility] [place]- [phone] 02/21/2018

## 2018-02-21 NOTE — Telephone Encounter (Signed)
Left message for patient to return my call to set up delivery.

## 2018-02-22 ENCOUNTER — Other Ambulatory Visit: Payer: Self-pay | Admitting: Radiology

## 2018-02-22 ENCOUNTER — Inpatient Hospital Stay: Payer: 59

## 2018-02-22 ENCOUNTER — Telehealth: Payer: Self-pay | Admitting: Pharmacist

## 2018-02-22 ENCOUNTER — Ambulatory Visit: Payer: 59

## 2018-02-22 VITALS — BP 107/69 | HR 84 | Temp 98.2°F | Resp 16

## 2018-02-22 DIAGNOSIS — C7911 Secondary malignant neoplasm of bladder: Secondary | ICD-10-CM | POA: Diagnosis not present

## 2018-02-22 DIAGNOSIS — N1339 Other hydronephrosis: Secondary | ICD-10-CM

## 2018-02-22 DIAGNOSIS — C50411 Malignant neoplasm of upper-outer quadrant of right female breast: Secondary | ICD-10-CM | POA: Diagnosis not present

## 2018-02-22 DIAGNOSIS — C50919 Malignant neoplasm of unspecified site of unspecified female breast: Secondary | ICD-10-CM

## 2018-02-22 DIAGNOSIS — Z9013 Acquired absence of bilateral breasts and nipples: Secondary | ICD-10-CM | POA: Diagnosis not present

## 2018-02-22 DIAGNOSIS — Z170 Estrogen receptor positive status [ER+]: Secondary | ICD-10-CM | POA: Diagnosis not present

## 2018-02-22 DIAGNOSIS — C50412 Malignant neoplasm of upper-outer quadrant of left female breast: Secondary | ICD-10-CM

## 2018-02-22 DIAGNOSIS — N136 Pyonephrosis: Secondary | ICD-10-CM | POA: Diagnosis not present

## 2018-02-22 DIAGNOSIS — R509 Fever, unspecified: Secondary | ICD-10-CM

## 2018-02-22 DIAGNOSIS — Z95828 Presence of other vascular implants and grafts: Secondary | ICD-10-CM

## 2018-02-22 DIAGNOSIS — C7949 Secondary malignant neoplasm of other parts of nervous system: Secondary | ICD-10-CM | POA: Diagnosis not present

## 2018-02-22 DIAGNOSIS — Z79818 Long term (current) use of other agents affecting estrogen receptors and estrogen levels: Secondary | ICD-10-CM | POA: Diagnosis not present

## 2018-02-22 DIAGNOSIS — Z9221 Personal history of antineoplastic chemotherapy: Secondary | ICD-10-CM | POA: Diagnosis not present

## 2018-02-22 DIAGNOSIS — Z923 Personal history of irradiation: Secondary | ICD-10-CM | POA: Diagnosis not present

## 2018-02-22 MED ORDER — DEXTROSE 5 % IV SOLN
1.0000 g | INTRAVENOUS | Status: DC
  Filled 2018-02-22: qty 10

## 2018-02-22 MED ORDER — SODIUM CHLORIDE 0.9 % IV SOLN
Freq: Once | INTRAVENOUS | Status: AC
  Administered 2018-02-22: 14:00:00 via INTRAVENOUS
  Filled 2018-02-22: qty 100

## 2018-02-22 MED ORDER — SODIUM CHLORIDE 0.9 % IV SOLN
Freq: Once | INTRAVENOUS | Status: DC
  Filled 2018-02-22: qty 100

## 2018-02-22 MED ORDER — SODIUM CHLORIDE 0.9 % IV SOLN
Freq: Once | INTRAVENOUS | Status: AC
  Administered 2018-02-22: 14:00:00 via INTRAVENOUS
  Filled 2018-02-22: qty 250

## 2018-02-22 MED ORDER — HEPARIN SOD (PORK) LOCK FLUSH 100 UNIT/ML IV SOLN
500.0000 [IU] | Freq: Once | INTRAVENOUS | Status: AC
  Administered 2018-02-22: 500 [IU] via INTRAVENOUS

## 2018-02-22 MED ORDER — SODIUM CHLORIDE 0.9% FLUSH
10.0000 mL | INTRAVENOUS | Status: DC | PRN
  Administered 2018-02-22: 10 mL via INTRAVENOUS
  Filled 2018-02-22: qty 10

## 2018-02-22 NOTE — Telephone Encounter (Signed)
Oral Chemotherapy Pharmacist Encounter  Patient is not to start Ibrance until instructed by Dr. Tasia Catchings to begin. Patient stated her understanding.  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  Counseled patient on administration, dosing, side effects, monitoring, drug-food interactions, safe handling, storage, and disposal. Patient will take 1 capsule (125 mg total) by mouth daily. Take whole with food. Take for 21 days on, 7 days off, repeat every 28 days.  Side effects include but not limited to: decreased plt/hgb/wbc, N/V, fatigue, hair loss.    Reviewed with patient importance of keeping a medication schedule and plan for any missed doses.  Mary [name] voiced understanding and appreciation. All questions answered. Medication handout provided and consent obtained.  Provided patient with [facility] phone number. Patient knows to call the office with questions or concerns. [facility] will continue to follow.  Darl Pikes, PharmD, BCPS, [facility] Hematology/Oncology Clinical Pharmacist [facility] [phone]  02/22/2018 4:01 PM

## 2018-02-23 ENCOUNTER — Inpatient Hospital Stay: Payer: 59

## 2018-02-23 ENCOUNTER — Other Ambulatory Visit: Payer: Self-pay | Admitting: *Deleted

## 2018-02-23 ENCOUNTER — Other Ambulatory Visit: Payer: Self-pay

## 2018-02-23 ENCOUNTER — Other Ambulatory Visit: Payer: Self-pay | Admitting: Oncology

## 2018-02-23 VITALS — BP 108/69 | HR 87 | Temp 99.1°F | Resp 16

## 2018-02-23 DIAGNOSIS — Z923 Personal history of irradiation: Secondary | ICD-10-CM | POA: Diagnosis not present

## 2018-02-23 DIAGNOSIS — Z95828 Presence of other vascular implants and grafts: Secondary | ICD-10-CM

## 2018-02-23 DIAGNOSIS — Z9013 Acquired absence of bilateral breasts and nipples: Secondary | ICD-10-CM | POA: Diagnosis not present

## 2018-02-23 DIAGNOSIS — Z9221 Personal history of antineoplastic chemotherapy: Secondary | ICD-10-CM | POA: Diagnosis not present

## 2018-02-23 DIAGNOSIS — C7911 Secondary malignant neoplasm of bladder: Secondary | ICD-10-CM | POA: Diagnosis not present

## 2018-02-23 DIAGNOSIS — N136 Pyonephrosis: Secondary | ICD-10-CM | POA: Diagnosis not present

## 2018-02-23 DIAGNOSIS — C7949 Secondary malignant neoplasm of other parts of nervous system: Secondary | ICD-10-CM | POA: Diagnosis not present

## 2018-02-23 DIAGNOSIS — R509 Fever, unspecified: Secondary | ICD-10-CM

## 2018-02-23 DIAGNOSIS — H53413 Scotoma involving central area, bilateral: Secondary | ICD-10-CM | POA: Diagnosis not present

## 2018-02-23 DIAGNOSIS — C50411 Malignant neoplasm of upper-outer quadrant of right female breast: Secondary | ICD-10-CM | POA: Diagnosis not present

## 2018-02-23 DIAGNOSIS — Z170 Estrogen receptor positive status [ER+]: Secondary | ICD-10-CM | POA: Diagnosis not present

## 2018-02-23 DIAGNOSIS — C50919 Malignant neoplasm of unspecified site of unspecified female breast: Secondary | ICD-10-CM | POA: Diagnosis not present

## 2018-02-23 DIAGNOSIS — C50412 Malignant neoplasm of upper-outer quadrant of left female breast: Secondary | ICD-10-CM

## 2018-02-23 DIAGNOSIS — Z79818 Long term (current) use of other agents affecting estrogen receptors and estrogen levels: Secondary | ICD-10-CM | POA: Diagnosis not present

## 2018-02-23 LAB — CBC WITH DIFFERENTIAL/PLATELET
Abs Immature Granulocytes: 0.03 10*3/uL (ref 0.00–0.07)
Basophils Absolute: 0 10*3/uL (ref 0.0–0.1)
Basophils Relative: 0 %
EOS PCT: 1 %
Eosinophils Absolute: 0.1 10*3/uL (ref 0.0–0.5)
HCT: 28.6 % — ABNORMAL LOW (ref 36.0–46.0)
Hemoglobin: 9 g/dL — ABNORMAL LOW (ref 12.0–15.0)
Immature Granulocytes: 0 %
Lymphocytes Relative: 14 %
Lymphs Abs: 1.4 10*3/uL (ref 0.7–4.0)
MCH: 29.4 pg (ref 26.0–34.0)
MCHC: 31.5 g/dL (ref 30.0–36.0)
MCV: 93.5 fL (ref 80.0–100.0)
Monocytes Absolute: 0.8 10*3/uL (ref 0.1–1.0)
Monocytes Relative: 8 %
Neutro Abs: 7.2 10*3/uL (ref 1.7–7.7)
Neutrophils Relative %: 77 %
Platelets: 386 10*3/uL (ref 150–400)
RBC: 3.06 MIL/uL — ABNORMAL LOW (ref 3.87–5.11)
RDW: 15.9 % — AB (ref 11.5–15.5)
WBC: 9.6 10*3/uL (ref 4.0–10.5)
nRBC: 0 % (ref 0.0–0.2)

## 2018-02-23 LAB — COMPREHENSIVE METABOLIC PANEL
ALT: 25 U/L (ref 0–44)
AST: 18 U/L (ref 15–41)
Albumin: 2.9 g/dL — ABNORMAL LOW (ref 3.5–5.0)
Alkaline Phosphatase: 97 U/L (ref 38–126)
Anion gap: 11 (ref 5–15)
BUN: 8 mg/dL (ref 6–20)
CHLORIDE: 108 mmol/L (ref 98–111)
CO2: 25 mmol/L (ref 22–32)
Calcium: 8.2 mg/dL — ABNORMAL LOW (ref 8.9–10.3)
Creatinine, Ser: 0.82 mg/dL (ref 0.44–1.00)
GFR calc Af Amer: >60 mL/min (ref >60)
GFR calc non Af Amer: >60 mL/min (ref >60)
Glucose, Bld: 123 mg/dL — ABNORMAL HIGH (ref 70–99)
POTASSIUM: 3.3 mmol/L — AB (ref 3.5–5.1)
Sodium: 144 mmol/L (ref 135–145)
Total Bilirubin: 0.3 mg/dL (ref 0.3–1.2)
Total Protein: 6.7 g/dL (ref 6.5–8.1)

## 2018-02-23 MED ORDER — APIXABAN 5 MG PO TABS: ORAL_TABLET | ORAL | 0 refills | Status: DC

## 2018-02-23 MED ORDER — HEPARIN SOD (PORK) LOCK FLUSH 100 UNIT/ML IV SOLN
500.0000 [IU] | Freq: Once | INTRAVENOUS | Status: AC
  Administered 2018-02-23: 500 [IU] via INTRAVENOUS
  Filled 2018-02-23: qty 5

## 2018-02-23 MED ORDER — CIPROFLOXACIN IN D5W 400 MG/200ML IV SOLN
400.0000 mg | Freq: Once | INTRAVENOUS | Status: AC
  Administered 2018-02-23: 400 mg via INTRAVENOUS
  Filled 2018-02-23: qty 200

## 2018-02-23 MED ORDER — CIPROFLOXACIN HCL 500 MG PO TABS: 500.0000 mg | ORAL_TABLET | Freq: Two times a day (BID) | ORAL | 0 refills | Status: DC

## 2018-02-23 MED ORDER — SODIUM CHLORIDE 0.9% FLUSH
10.0000 mL | INTRAVENOUS | Status: DC | PRN
  Administered 2018-02-23: 10 mL via INTRAVENOUS
  Filled 2018-02-23: qty 10

## 2018-02-23 MED FILL — IBRANCE 125 MG CAPSULE: 125 | 28 days supply | Qty: 21 | Fill #0

## 2018-02-23 MED FILL — SHIPPING COST: 1 days supply | Qty: 1 | Fill #0

## 2018-02-23 NOTE — Progress Notes (Signed)
Urine culture discussed with patient.  Switch to Cipro She will get one dose of IV cipro 400mg  today at cancer center.  Rx of Cipro 500mg  BID sent to her pharmacy.

## 2018-02-23 NOTE — Telephone Encounter (Signed)
Spoke to patients husband, Jenny Reichmann. He will call back today.

## 2018-02-23 NOTE — Telephone Encounter (Signed)
Spoke with Jenny Reichmann. Medication will be mailed today 12/6 for delivery 12/9.

## 2018-02-24 LAB — URINE CULTURE

## 2018-02-26 LAB — CULTURE, BLOOD (ROUTINE X 2)
Culture: NO GROWTH
Culture: NO GROWTH
Special Requests: ADEQUATE
Special Requests: ADEQUATE

## 2018-02-27 DIAGNOSIS — H3500 Unspecified background retinopathy: Secondary | ICD-10-CM | POA: Diagnosis not present

## 2018-02-27 DIAGNOSIS — G130 Paraneoplastic neuromyopathy and neuropathy: Secondary | ICD-10-CM | POA: Diagnosis not present

## 2018-02-27 DIAGNOSIS — H53413 Scotoma involving central area, bilateral: Secondary | ICD-10-CM | POA: Diagnosis not present

## 2018-02-27 DIAGNOSIS — C50919 Malignant neoplasm of unspecified site of unspecified female breast: Secondary | ICD-10-CM | POA: Diagnosis not present

## 2018-02-27 DIAGNOSIS — D499 Neoplasm of unspecified behavior of unspecified site: Secondary | ICD-10-CM | POA: Diagnosis not present

## 2018-02-28 ENCOUNTER — Inpatient Hospital Stay (HOSPITAL_BASED_OUTPATIENT_CLINIC_OR_DEPARTMENT_OTHER): Payer: 59 | Admitting: Oncology

## 2018-02-28 ENCOUNTER — Encounter: Payer: Self-pay | Admitting: Oncology

## 2018-02-28 ENCOUNTER — Other Ambulatory Visit: Payer: Self-pay

## 2018-02-28 ENCOUNTER — Inpatient Hospital Stay: Payer: 59

## 2018-02-28 VITALS — BP 104/70 | HR 79 | Temp 97.1°F | Resp 18 | Wt 142.7 lb

## 2018-02-28 DIAGNOSIS — C7911 Secondary malignant neoplasm of bladder: Secondary | ICD-10-CM | POA: Diagnosis not present

## 2018-02-28 DIAGNOSIS — Z79818 Long term (current) use of other agents affecting estrogen receptors and estrogen levels: Secondary | ICD-10-CM | POA: Diagnosis not present

## 2018-02-28 DIAGNOSIS — K219 Gastro-esophageal reflux disease without esophagitis: Secondary | ICD-10-CM

## 2018-02-28 DIAGNOSIS — C50411 Malignant neoplasm of upper-outer quadrant of right female breast: Secondary | ICD-10-CM

## 2018-02-28 DIAGNOSIS — Z7901 Long term (current) use of anticoagulants: Secondary | ICD-10-CM

## 2018-02-28 DIAGNOSIS — C50919 Malignant neoplasm of unspecified site of unspecified female breast: Secondary | ICD-10-CM

## 2018-02-28 DIAGNOSIS — F419 Anxiety disorder, unspecified: Secondary | ICD-10-CM

## 2018-02-28 DIAGNOSIS — Z9013 Acquired absence of bilateral breasts and nipples: Secondary | ICD-10-CM

## 2018-02-28 DIAGNOSIS — Z923 Personal history of irradiation: Secondary | ICD-10-CM | POA: Diagnosis not present

## 2018-02-28 DIAGNOSIS — C50412 Malignant neoplasm of upper-outer quadrant of left female breast: Secondary | ICD-10-CM

## 2018-02-28 DIAGNOSIS — Z170 Estrogen receptor positive status [ER+]: Secondary | ICD-10-CM | POA: Diagnosis not present

## 2018-02-28 DIAGNOSIS — Z79899 Other long term (current) drug therapy: Secondary | ICD-10-CM

## 2018-02-28 DIAGNOSIS — R5383 Other fatigue: Secondary | ICD-10-CM

## 2018-02-28 DIAGNOSIS — C7949 Secondary malignant neoplasm of other parts of nervous system: Secondary | ICD-10-CM | POA: Diagnosis not present

## 2018-02-28 DIAGNOSIS — Z936 Other artificial openings of urinary tract status: Secondary | ICD-10-CM

## 2018-02-28 DIAGNOSIS — R5381 Other malaise: Secondary | ICD-10-CM

## 2018-02-28 DIAGNOSIS — N136 Pyonephrosis: Secondary | ICD-10-CM

## 2018-02-28 DIAGNOSIS — I10 Essential (primary) hypertension: Secondary | ICD-10-CM

## 2018-02-28 DIAGNOSIS — D6481 Anemia due to antineoplastic chemotherapy: Secondary | ICD-10-CM

## 2018-02-28 DIAGNOSIS — R7989 Other specified abnormal findings of blood chemistry: Secondary | ICD-10-CM

## 2018-02-28 DIAGNOSIS — Z9221 Personal history of antineoplastic chemotherapy: Secondary | ICD-10-CM | POA: Diagnosis not present

## 2018-02-28 DIAGNOSIS — Z86718 Personal history of other venous thrombosis and embolism: Secondary | ICD-10-CM

## 2018-02-28 DIAGNOSIS — N3000 Acute cystitis without hematuria: Secondary | ICD-10-CM

## 2018-02-28 DIAGNOSIS — T451X5S Adverse effect of antineoplastic and immunosuppressive drugs, sequela: Secondary | ICD-10-CM

## 2018-02-28 DIAGNOSIS — N390 Urinary tract infection, site not specified: Secondary | ICD-10-CM

## 2018-02-28 DIAGNOSIS — Z87891 Personal history of nicotine dependence: Secondary | ICD-10-CM

## 2018-02-28 DIAGNOSIS — T451X5A Adverse effect of antineoplastic and immunosuppressive drugs, initial encounter: Secondary | ICD-10-CM

## 2018-02-28 LAB — COMPREHENSIVE METABOLIC PANEL
ALT: 26 U/L (ref 0–44)
ANION GAP: 8 (ref 5–15)
AST: 26 U/L (ref 15–41)
Albumin: 3.3 g/dL — ABNORMAL LOW (ref 3.5–5.0)
Alkaline Phosphatase: 95 U/L (ref 38–126)
BUN: 9 mg/dL (ref 6–20)
CO2: 30 mmol/L (ref 22–32)
Calcium: 8.8 mg/dL — ABNORMAL LOW (ref 8.9–10.3)
Chloride: 105 mmol/L (ref 98–111)
Creatinine, Ser: 1.02 mg/dL — ABNORMAL HIGH (ref 0.44–1.00)
GFR calc Af Amer: >60 mL/min (ref >60)
GFR calc non Af Amer: >60 mL/min (ref >60)
GLUCOSE: 113 mg/dL — AB (ref 70–99)
Potassium: 3.8 mmol/L (ref 3.5–5.1)
Sodium: 143 mmol/L (ref 135–145)
Total Bilirubin: 0.2 mg/dL — ABNORMAL LOW (ref 0.3–1.2)
Total Protein: 7 g/dL (ref 6.5–8.1)

## 2018-02-28 LAB — CBC WITH DIFFERENTIAL/PLATELET
Abs Immature Granulocytes: 0.04 10*3/uL (ref 0.00–0.07)
Basophils Absolute: 0.1 10*3/uL (ref 0.0–0.1)
Basophils Relative: 1 %
Eosinophils Absolute: 0.3 10*3/uL (ref 0.0–0.5)
Eosinophils Relative: 4 %
HCT: 33.7 % — ABNORMAL LOW (ref 36.0–46.0)
Hemoglobin: 10.5 g/dL — ABNORMAL LOW (ref 12.0–15.0)
Immature Granulocytes: 1 %
Lymphocytes Relative: 14 %
Lymphs Abs: 1.1 10*3/uL (ref 0.7–4.0)
MCH: 29.2 pg (ref 26.0–34.0)
MCHC: 31.2 g/dL (ref 30.0–36.0)
MCV: 93.6 fL (ref 80.0–100.0)
Monocytes Absolute: 0.6 10*3/uL (ref 0.1–1.0)
Monocytes Relative: 7 %
Neutro Abs: 5.6 10*3/uL (ref 1.7–7.7)
Neutrophils Relative %: 73 %
Platelets: 522 10*3/uL — ABNORMAL HIGH (ref 150–400)
RBC: 3.6 MIL/uL — ABNORMAL LOW (ref 3.87–5.11)
RDW: 15.9 % — ABNORMAL HIGH (ref 11.5–15.5)
WBC: 7.6 10*3/uL (ref 4.0–10.5)
nRBC: 0 % (ref 0.0–0.2)

## 2018-02-28 MED ORDER — FULVESTRANT 250 MG/5ML IM SOLN
500.0000 mg | Freq: Once | INTRAMUSCULAR | Status: AC
  Administered 2018-02-28: 500 mg via INTRAMUSCULAR
  Filled 2018-02-28: qty 10

## 2018-02-28 NOTE — Progress Notes (Signed)
Patient here for follow up. PT states feeling better. PT and husband concerned about the cost of Ibrance.

## 2018-02-28 NOTE — Progress Notes (Addendum)
Hematology/Oncology Follow Up Note [facility]  Telephone:([phone] [phone]  Patient Care Team: [name], MD as PCP - General (Internal Medicine)   Name of the patient: Mary [name]  [id number]  1960/10/02   REASON FOR VISIT  follow-up for management of metastatic breast cancer.  Pertinent oncology history # She was diagnosed with right upper quadrant breast in August 2013, status post neoadjuvant chemotherapy with FEC x4 followed by weekly Taxol x2, underwent bilateral mastectomy [February 2014], right ALND, and left sentinel LN biopsy.  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  Tis N0.  Patient also received adjuvant radiation.   Patient was started initially on tamoxifen plus ovarian suppression with Zoladex in June 2014,  and changed to Aromasin plus Zoladex monthly since June 2015.follows up with Dr.Gudina Loleta Dicker at [facility] in [place]. reports that she twisted and sprained her left ankle when getting out of her car on 12/11/2017.  Ankle has been swollen.  #Patient presented to emergency room due to abnormal lab values.  Calcium level was 13.9 with creatinine 1.63.  Also endorses right lower quadrant/right flank/right lower back pain for the past couple of days.  Also having had episodes of dizziness which were diagnosed as vertigo since April 2019.  She had a negative MRI brain done at that time which were essentially negative for acute findings.  In the emergency room CT renal stone study were independently reviewed by me which showed bilateral hydronephrosis, due to the stricture at the level of UPJ's.  Masslike thickening of the anterior and the lateral bladder wall suspicious for metastatic implant.  Mesenteric, omental, peritoneal implants with small ascites and diffuse mesenteric edema.  Small to moderate bilateral pleural effusion  with associated compressive atelectasis of the lung.  Diffuse small osseous skeletal but stated disease.  Patient was seen and evaluated by urology Dr. Erlene Quan.  She is status post bilateral ureter stent placement and infiltrative bladder mass biopsy. Unfortunately her creatinine increased further during the admission and eventually she got bilateral percutaneous nephrostomy tube placed.  Creatinine gradually gets better after hydration. For her hypercalcemia, patient received 1 dose of calcitonin and 1 dose of Zometa.  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 She has visceral crisis with metastatic breast cancer.  In order to avoid further delay outpatient, she was started on first dose of Taxol 80 mg/m on 12/22/2017 after detailed discussion of chemotherapy side effects and complications.  #Left lower extremity DVT, she was anticoagulated with heparin drip.  Switched to Eliquis 5 mg twice daily outpatient. # Patient previously follows up at [facility] center.  She prefers to follow-up with [facility] as it is closer to her [name].  INTERVAL HISTORY 57 year old female with metastatic breast lobular carcinoma presents for management of metastatic breast cancer and UTI.   # Acinetobacter calcoaceticus.baumannii complex/E coli UTI, on Cipro 500mg  BID.  No additional fever episodes.   # received Faslodex and Zoladex 2 weeks ago.  # nephrostomy tube capped for 2 weeks, no flank pain.  Appetite is fair.  # Seen [facility] for blurry vision, lab work up in process, pending plan to start ocular steroid injections.   Review of Systems  Constitutional: Positive for malaise/fatigue. Negative for chills, fever and weight loss.  HENT: Negative for nosebleeds and sore throat.   Eyes: Negative for double vision,  photophobia and redness.       Blurry vision  Respiratory: Negative for cough, shortness of breath and wheezing.   Cardiovascular: Negative for chest pain, palpitations, orthopnea and leg swelling.  Gastrointestinal: Negative for abdominal pain, blood in stool, nausea and vomiting.  Genitourinary: Negative for dysuria, flank pain, frequency and urgency.       Nephrostomy tube capped.   Musculoskeletal: Negative for back pain, myalgias and neck pain.  Skin: Negative for itching and rash.  Neurological: Negative for dizziness, tingling and tremors.  Endo/Heme/Allergies: Negative for environmental allergies. Does not bruise/bleed easily.  Psychiatric/Behavioral: Negative for depression and hallucinations.      Allergies  Allergen Reactions  . Adhesive [Tape] Other (See Comments)    Redness:Please use paper tape     Past Medical History:  Diagnosis Date  . Anxiety    takes Xanax prn  . Breast cancer [facility]) August 2013   bilateral, er/pr+  . Complication of anesthesia    pt states she had the shakes extremely bad  . Cough    at night d/t reflux;nothing productive  . Dental crowns present    x 4  . GERD (gastroesophageal reflux disease)    takes Nexium daily  . History of bronchitis    08/2011  . History of colon polyps   . Hot flashes, menopausal 08/08/2012  . IBS (irritable bowel syndrome)    immodium prn  . Insomnia    takes Ambien nightly  . Irritable bowel syndrome   . Neutropenia, drug-induced ([place]) 12/15/2011  . Nocturia   . Plantar fasciitis   . S/P radiation therapy 07/03/2012 through 08/18/2038                                                        Right chest wall/regional lymph nodes 5040 cGy 28 sessions, right chest wall/mastectomy scar boost 1000 cGy 5 sessions                          . Status post chemotherapy    FEC 100 starting on 12/08/2011 - 01/18/12/single agent Taxol x12 weeks from 02/02/2012 through January 2014   . Urinary frequency   . Use of  tamoxifen (Nolvadex)   . Vertigo      Past Surgical History:  Procedure Laterality Date  . BREAST LUMPECTOMY  2008   left  . COLONOSCOPY    . COLONOSCOPY WITH PROPOFOL N/A 10/10/2017   Procedure: COLONOSCOPY WITH PROPOFOL;  Surgeon: Lucilla Lame, MD;  Location: [facility] ENDOSCOPY;  Service: Endoscopy;  Laterality: N/A;  . CYSTOSCOPY WITH STENT PLACEMENT Bilateral 12/17/2017   Procedure: CYSTOSCOPY WITH STENT PLACEMENT;  Surgeon: Hollice Espy, MD;  Location: [facility] ORS;  Service: Urology;  Laterality: Bilateral;  . IR NEPHROSTOMY EXCHANGE LEFT  02/13/2018  . IR NEPHROSTOMY EXCHANGE RIGHT  01/02/2018  . IR NEPHROSTOMY EXCHANGE RIGHT  02/13/2018  . IR NEPHROSTOMY PLACEMENT LEFT  12/19/2017  . IR NEPHROSTOMY PLACEMENT RIGHT  12/19/2017  . MASTECTOMY    . MASTECTOMY WITH AXILLARY LYMPH NODE DISSECTION Right 05/18/2012   Procedure: MASTECTOMY WITH AXILLARY LYMPH NODE DISSECTION;  Surgeon: Adin Hector, MD;  Location: [place];  Service: General;  Laterality: Right;  Bilateral Total  Mastectomy , right axillary lymph node dissection left axillary sentinel node biopsy, removal of Porta Cath  . PORT-A-CATH REMOVAL N/A 05/18/2012   Procedure: REMOVAL PORT-A-CATH;  Surgeon: Adin Hector, MD;  Location: [place];  Service: General;  Laterality: N/A;  . PORTA CATH INSERTION N/A 12/26/2017   Procedure: PORTA CATH INSERTION;  Surgeon: Katha Cabal, MD;  Location: [place] [facility];  Service: Cardiovascular;  Laterality: N/A;  . PORTACATH PLACEMENT  12/02/2011   Procedure: INSERTION PORT-A-CATH;  Surgeon: Adin Hector, MD;  Location: [place];  Service: General;  Laterality: N/A;  insertion port a cath with fluoroscopy  . SIMPLE MASTECTOMY WITH AXILLARY SENTINEL NODE BIOPSY Left 05/18/2012   Procedure: SIMPLE MASTECTOMY WITH AXILLARY SENTINEL NODE BIOPSY;  Surgeon: Adin Hector, MD;  Location: [place];  Service: General;  Laterality: Left;    Social History   Socioeconomic  History  . Marital status: Married    Spouse name: Not on file  . Number of children: 0  . Years of education: Not on file  . Highest education level: Not on file  Occupational History  . Occupation: Network engineer II    Employer: [employer]  Social Needs  . Financial resource strain: Not on file  . Food insecurity:    Worry: Not on file    Inability: Not on file  . Transportation needs:    Medical: Not on file    Non-medical: Not on file  Tobacco Use  . Smoking status: Former Smoker    Packs/day: 0.50    Years: 15.00    Pack years: 7.50    Types: Cigarettes    Last attempt to quit: 03/21/1997    Years since quitting: 20.9  . Smokeless tobacco: Never Used  Substance and Sexual Activity  . Alcohol use: Not Currently    Alcohol/week: 0.0 standard drinks    Comment: occasionally wine  . Drug use: No  . Sexual activity: Yes    Birth control/protection: None  Lifestyle  . Physical activity:    Days per week: Not on file    Minutes per session: Not on file  . Stress: Not on file  Relationships  . Social connections:    Talks on phone: Not on file    Gets together: Not on file    Attends religious service: Not on file    Active member of club or organization: Not on file    Attends meetings of clubs or organizations: Not on file    Relationship status: Not on file  . Intimate partner violence:    Fear of current or ex partner: Not on file    Emotionally abused: Not on file    Physically abused: Not on file    Forced sexual activity: Not on file  Other Topics Concern  . Not on file  Social History Narrative   Married to [employer]  No children 47 years old with fmp.  20 year use of birth control pills.      Family History  Problem Relation Age of Onset  . Hypertension Mother   . Diabetes Mother   . Hypertension Maternal Grandmother   . Pancreatic cancer Paternal Grandmother        diagnosed in her 94s  . Ovarian cancer Maternal Aunt        maternal grandmother's  sister  . Breast cancer Paternal Aunt        diagnosed in late 5s early 13s  . Parkinsonism  Paternal Grandfather      Current Outpatient Medications:  .  amLODipine (NORVASC) 5 MG tablet, TAKE 1 TABLET BY MOUTH DAILY., Disp: 90 tablet, Rfl: 1 .  Ascorbic Acid (VITAMIN C) 100 MG tablet, Take 100 mg by mouth daily.  , Disp: , Rfl:  .  Calcium Carbonate-Vitamin D (CALTRATE 600+D PO), Take 1 tablet by mouth daily., Disp: , Rfl:  .  ciprofloxacin (CIPRO) 500 MG tablet, Take 1 tablet (500 mg total) by mouth 2 (two) times daily., Disp: 14 tablet, Rfl: 0 .  diazepam (VALIUM) 10 MG tablet, Take 1 tablet (10 mg total) by mouth at bedtime as needed for anxiety., Disp: 30 tablet, Rfl: 5 .  esomeprazole (NEXIUM) 40 MG capsule, Take 40 mg by mouth daily as needed. , Disp: , Rfl:  .  ferrous sulfate 325 (65 FE) MG EC tablet, Take 1 tablet (325 mg total) by mouth daily., Disp: 30 tablet, Rfl: 3 .  furosemide (LASIX) 20 MG tablet, Take 1 tablet (20 mg total) by mouth daily as needed., Disp: 30 tablet, Rfl: 1 .  lidocaine-prilocaine (EMLA) cream, Apply to affected area once, Disp: 30 g, Rfl: 3 .  loperamide (IMODIUM A-D) 2 MG tablet, Take 2 mg by mouth 4 (four) times daily as needed for diarrhea or loose stools. , Disp: , Rfl:  .  Multiple Vitamin (MULTIVITAMIN) tablet, Take 1 tablet by mouth daily.  , Disp: , Rfl:  .  ondansetron (ZOFRAN) 4 MG tablet, Take 1 tablet (4 mg total) by mouth every 8 (eight) hours as needed for nausea or vomiting., Disp: 20 tablet, Rfl: 0 .  oxybutynin (DITROPAN) 5 MG tablet, Take 1 tablet (5 mg total) by mouth every 8 (eight) hours as needed for bladder spasms., Disp: 20 tablet, Rfl: 0 .  prochlorperazine (COMPAZINE) 10 MG tablet, Take 1 tablet (10 mg total) by mouth every 6 (six) hours as needed (Nausea or vomiting)., Disp: 30 tablet, Rfl: 1 .  scopolamine (TRANSDERM-SCOP) 1 MG/3DAYS, Place 1 patch (1.5 mg total) onto the skin every 3 (three) days., Disp: 10 patch, Rfl: 0 .   traMADol (ULTRAM) 50 MG tablet, Take 1 tablet (50 mg total) by mouth every 6 (six) hours as needed., Disp: 60 tablet, Rfl: 0 .  traZODone (DESYREL) 50 MG tablet, Take 0.5-1 tablets (25-50 mg total) by mouth at bedtime as needed for sleep., Disp: 90 tablet, Rfl: 1 .  apixaban (ELIQUIS) 5 MG TABS tablet, Two tablets twice a day for seven days then one tablet twice a day afterwards (Patient not taking: Reported on 02/28/2018), Disp: 60 tablet, Rfl: 0 .  dexamethasone (DECADRON) 4 MG tablet, Take 1 tablet (4 mg total) by mouth See admin instructions. You may take 4 mg daily for 2 days after chemotherapy if needed for nausea (Patient not taking: Reported on 02/28/2018), Disp: 30 tablet, Rfl: 0 .  palbociclib (IBRANCE) 125 MG capsule, Take 1 capsule (125 mg total) by mouth daily. Take whole with food. Take for 21 days on, 7 days off, repeat every 28 days. (Patient not taking: Reported on 02/21/2018), Disp: 21 capsule, Rfl: 2 .  potassium chloride SA (K-DUR,KLOR-CON) 20 MEQ tablet, Take 1 tablet (20 mEq total) by mouth daily. (Patient not taking: Reported on 02/21/2018), Disp: 30 tablet, Rfl: 0  Physical exam: ECOG 1  Vitals:   02/28/18 1003  BP: 104/70  Pulse: 79  Resp: 18  Temp: (!) 97.1 F (36.2 C)  TempSrc: Tympanic  Weight: 142 lb 11.2 oz (64.7 kg)  Physical Exam  Constitutional: She is oriented to person, place, and time. No distress.  HENT:  Head: Normocephalic and atraumatic.  Mouth/Throat: Oropharynx is clear and moist.  Eyes: Pupils are equal, round, and reactive to light. EOM are normal. No scleral icterus.  Neck: Normal range of motion. Neck supple.  Cardiovascular: Normal rate, regular rhythm and normal heart sounds.  Pulmonary/Chest: Effort normal. No respiratory distress. She has no wheezes.  Decreased breath sounds at right lower base, improved.  Abdominal: Soft. Bowel sounds are normal. She exhibits no distension and no mass. There is no tenderness.  Genitourinary:    Genitourinary Comments: Bilateral percutaneous nephrostomy tubes capped. Sites have no signs of infection.   Musculoskeletal: Normal range of motion. She exhibits no edema or deformity.  Neurological: She is alert and oriented to person, place, and time. No cranial nerve deficit. Coordination normal.  Skin: Skin is warm and dry. No rash noted. No erythema.  Psychiatric: She has a normal mood and affect. Her behavior is normal. Thought content normal.    CMP Latest Ref Rng & Units 02/28/2018  Glucose 70 - 99 mg/dL 113(H)  BUN 6 - 20 mg/dL 9  Creatinine 0.44 - 1.00 mg/dL 1.02(H)  Sodium 135 - 145 mmol/L 143  Potassium 3.5 - 5.1 mmol/L 3.8  Chloride 98 - 111 mmol/L 105  CO2 22 - 32 mmol/L 30  Calcium 8.9 - 10.3 mg/dL 8.8(L)  Total Protein 6.5 - 8.1 g/dL 7.0  Total Bilirubin 0.3 - 1.2 mg/dL 0.2(L)  Alkaline Phos 38 - 126 U/L 95  AST 15 - 41 U/L 26  ALT 0 - 44 U/L 26   CBC Latest Ref Rng & Units 02/28/2018  WBC 4.0 - 10.5 K/uL 7.6  Hemoglobin 12.0 - 15.0 g/dL 10.5(L)  Hematocrit 36.0 - 46.0 % 33.7(L)  Platelets 150 - 400 K/uL 522(H)    Ct Chest Wo Contrast  Result Date: 02/06/2018 CLINICAL DATA:  Renal failure. Recent right percutaneous nephrostomy change. History of breast cancer with chemotherapy. 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 EXAM: CT CHEST, ABDOMEN AND PELVIS WITHOUT CONTRAST TECHNIQUE: Multidetector CT imaging of the chest, abdomen and pelvis was performed following the standard protocol without IV contrast. COMPARISON:  PET-CT 12/01/2011.  Abdominopelvic CT 12/08/2017. FINDINGS: CT CHEST FINDINGS Cardiovascular: Right IJ Port-A-Cath extends to the superior cavoatrial junction. No acute vascular findings are demonstrated on noncontrast imaging. The heart size is normal. There is no pericardial effusion. Mediastinum/Nodes: There are no enlarged mediastinal, hilar, axillary or internal mammary lymph nodes. There are postsurgical changes in both  axilla. There is a stable 11 mm low-density right thyroid nodule on image number 12, unlikely to be clinically significant. The trachea and esophagus appear unremarkable. Lungs/Pleura: Small dependent right pleural effusion. There is new linear right apical opacity on images 21 through 29 of series 3, likely the sequela of prior radiation therapy. There is mild linear scarring or atelectasis at both lung bases. No confluent airspace opacity, suspicious pulmonary nodule or endobronchial lesion. Musculoskeletal/Chest wall: No chest wall mass status post bilateral mastectomy. There are external breast prostheses bilaterally. The bones are diffusely heterogeneous. No focal lytic or blastic osseous lesions are identified. CT ABDOMEN AND PELVIS FINDINGS Hepatobiliary: There are scattered small low-density hepatic lesions which are grossly stable. No definite new or enlarging lesions identified on non-contrast imaging. No evidence of gallstones, gallbladder wall thickening or biliary dilatation. Pancreas: Unremarkable. No pancreatic ductal dilatation or surrounding inflammatory changes. Spleen: Normal in size without  focal abnormality. Adrenals/Urinary Tract: Both adrenal glands appear normal. There are bilateral percutaneous nephrostomies which appear well positioned. The previously demonstrated bilateral hydronephrosis and hydroureter have nearly completely resolved. There is improved perinephric soft tissue stranding bilaterally without focal fluid collection. There is no evidence of urinary tract calculus. The urinary bladder is decompressed without significant change in previously demonstrated left-sided bladder wall thickening. Stomach/Bowel: No evidence of bowel wall thickening, distention or surrounding inflammatory change. There is moderate stool throughout the colon. Vascular/Lymphatic: There are multiple small retroperitoneal and mesenteric lymph nodes which are not pathologically enlarged. There is minimal  aortic atherosclerosis without acute vascular findings on noncontrast imaging. Reproductive: The uterus is retroverted without focal abnormality. There is a stable 3.5 x 2.7 cm right adnexal lesion on image 58/7. The left ovary appears normal. Other: Ascites and mesenteric edema have improved. No increasing peritoneal nodularity identified. Musculoskeletal: There is diffuse marrow heterogeneity with scattered sclerotic lesions, largely stable from remote PET-CT. There is a lucent lesion in the right iliac bone (image 47/7) which demonstrates increased sclerosis since CT of 2 months ago, possibly a treated metastasis. IMPRESSION: 1. Near-complete resolution of bilateral hydronephrosis status post percutaneous nephrostomy. Stable asymmetric bladder wall thickening on the left secondary to known metastatic disease. 2. Ascites and peritoneal nodularity appear mildly improved. Stable right adnexal lesion, potentially a metastasis. 3. Possible treated osseous metastatic disease in the right iliac bone. The bones are heterogeneous without other focal lesions. 4. No definite evidence of thoracic metastatic disease. Electronically Signed   By: Richardean Sale M.D.   On: 02/06/2018 14:42   Ct Renal Stone Study  Result Date: 02/06/2018 CLINICAL DATA:  Renal failure. Recent right percutaneous nephrostomy change. History of breast cancer with chemotherapy. 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 EXAM: CT CHEST, ABDOMEN AND PELVIS WITHOUT CONTRAST TECHNIQUE: Multidetector CT imaging of the chest, abdomen and pelvis was performed following the standard protocol without IV contrast. COMPARISON:  PET-CT 12/01/2011.  Abdominopelvic CT 12/08/2017. FINDINGS: CT CHEST FINDINGS Cardiovascular: Right IJ Port-A-Cath extends to the superior cavoatrial junction. No acute vascular findings are demonstrated on noncontrast imaging. The heart size is normal. There is no pericardial effusion. Mediastinum/Nodes:  There are no enlarged mediastinal, hilar, axillary or internal mammary lymph nodes. There are postsurgical changes in both axilla. There is a stable 11 mm low-density right thyroid nodule on image number 12, unlikely to be clinically significant. The trachea and esophagus appear unremarkable. Lungs/Pleura: Small dependent right pleural effusion. There is new linear right apical opacity on images 21 through 29 of series 3, likely the sequela of prior radiation therapy. There is mild linear scarring or atelectasis at both lung bases. No confluent airspace opacity, suspicious pulmonary nodule or endobronchial lesion. Musculoskeletal/Chest wall: No chest wall mass status post bilateral mastectomy. There are external breast prostheses bilaterally. The bones are diffusely heterogeneous. No focal lytic or blastic osseous lesions are identified. CT ABDOMEN AND PELVIS FINDINGS Hepatobiliary: There are scattered small low-density hepatic lesions which are grossly stable. No definite new or enlarging lesions identified on non-contrast imaging. No evidence of gallstones, gallbladder wall thickening or biliary dilatation. Pancreas: Unremarkable. No pancreatic ductal dilatation or surrounding inflammatory changes. Spleen: Normal in size without focal abnormality. Adrenals/Urinary Tract: Both adrenal glands appear normal. There are bilateral percutaneous nephrostomies which appear well positioned. The previously demonstrated bilateral hydronephrosis and hydroureter have nearly completely resolved. There is improved perinephric soft tissue stranding bilaterally without focal fluid collection. There is no evidence of urinary tract  calculus. The urinary bladder is decompressed without significant change in previously demonstrated left-sided bladder wall thickening. Stomach/Bowel: No evidence of bowel wall thickening, distention or surrounding inflammatory change. There is moderate stool throughout the colon. Vascular/Lymphatic: There  are multiple small retroperitoneal and mesenteric lymph nodes which are not pathologically enlarged. There is minimal aortic atherosclerosis without acute vascular findings on noncontrast imaging. Reproductive: The uterus is retroverted without focal abnormality. There is a stable 3.5 x 2.7 cm right adnexal lesion on image 58/7. The left ovary appears normal. Other: Ascites and mesenteric edema have improved. No increasing peritoneal nodularity identified. Musculoskeletal: There is diffuse marrow heterogeneity with scattered sclerotic lesions, largely stable from remote PET-CT. There is a lucent lesion in the right iliac bone (image 47/7) which demonstrates increased sclerosis since CT of 2 months ago, possibly a treated metastasis. IMPRESSION: 1. Near-complete resolution of bilateral hydronephrosis status post percutaneous nephrostomy. Stable asymmetric bladder wall thickening on the left secondary to known metastatic disease. 2. Ascites and peritoneal nodularity appear mildly improved. Stable right adnexal lesion, potentially a metastasis. 3. Possible treated osseous metastatic disease in the right iliac bone. The bones are heterogeneous without other focal lesions. 4. No definite evidence of thoracic metastatic disease. Electronically Signed   By: Richardean Sale M.D.   On: 02/06/2018 14:42   Ir Nephrostomy Exchange Left  Result Date: 02/13/2018 INDICATION: History of breast carcinoma with prior bilateral percutaneous nephrostomy tube placement for ureteral obstruction and hydronephrosis. EXAM: BILATERAL PERCUTANEOUS NEPHROSTOMY TUBE EXCHANGE COMPARISON:  None. MEDICATIONS: None ANESTHESIA/SEDATION: None CONTRAST:  20 mL Isovue-300-administered into the collecting system(s) FLUOROSCOPY TIME:  Fluoroscopy Time: 1 minutes and 6 seconds. COMPLICATIONS: None immediate. PROCEDURE: Informed written consent was obtained from the patient after a thorough discussion of the procedural risks, benefits and alternatives.  All questions were addressed. Maximal Sterile Barrier Technique was utilized including caps, mask, sterile gowns, sterile gloves, sterile drape, hand hygiene and skin antiseptic. A timeout was performed prior to the initiation of the procedure. The left 8.5 French nephrostomy tube was injected with contrast material. The catheter was cut and removed over a guidewire. A new nephrostomy tube was advanced over the wire. This tube was injected with contrast material and ureteral patency assessed to the level of the bladder. The tube was flushed and connected to a new gravity drainage bag. The right 10 French nephrostomy tube was injected with contrast material. The catheter was cut and removed over a guidewire. A new nephrostomy tube was advanced over the wire. This tube was injected with contrast material and ureteral patency assessed to the level of the bladder. The tube was flushed and connected to a new gravity drainage bag. Both tubes were secured at the skin with Prolene retention sutures. FINDINGS: Both nephrostomy tubes were exchanged and formed at the level of the renal pelvis bilaterally. Nephrostogram spine laterally now demonstrate patent ureters to the level of the bladder without evidence of obstruction. Pending on course of current cancer treatment and renal function, a trial of capping of the nephrostomy tubes could be considered bilaterally prior to potential removal. IMPRESSION: Bilateral nephrostomy tube exchange, as above. Both tubes were reconnected to gravity drainage bags today. Nephrostograms now demonstrate patent ureters to the level of the bladder bilaterally without evidence of obstruction. Depending on current cancer treatment plan and renal function, a trial of capping of the nephrostomy tubes can be considered bilaterally prior to potential removal. Electronically Signed   By: Aletta Edouard M.D.   On: 02/13/2018 10:10  Ir Nephrostomy Exchange Right  Result Date:  02/13/2018 INDICATION: History of breast carcinoma with prior bilateral percutaneous nephrostomy tube placement for ureteral obstruction and hydronephrosis. EXAM: BILATERAL PERCUTANEOUS NEPHROSTOMY TUBE EXCHANGE COMPARISON:  None. MEDICATIONS: None ANESTHESIA/SEDATION: None CONTRAST:  20 mL Isovue-300-administered into the collecting system(s) FLUOROSCOPY TIME:  Fluoroscopy Time: 1 minutes and 6 seconds. COMPLICATIONS: None immediate. PROCEDURE: Informed written consent was obtained from the patient after a thorough discussion of the procedural risks, benefits and alternatives. All questions were addressed. Maximal Sterile Barrier Technique was utilized including caps, mask, sterile gowns, sterile gloves, sterile drape, hand hygiene and skin antiseptic. A timeout was performed prior to the initiation of the procedure. The left 8.5 French nephrostomy tube was injected with contrast material. The catheter was cut and removed over a guidewire. A new nephrostomy tube was advanced over the wire. This tube was injected with contrast material and ureteral patency assessed to the level of the bladder. The tube was flushed and connected to a new gravity drainage bag. The right 10 French nephrostomy tube was injected with contrast material. The catheter was cut and removed over a guidewire. A new nephrostomy tube was advanced over the wire. This tube was injected with contrast material and ureteral patency assessed to the level of the bladder. The tube was flushed and connected to a new gravity drainage bag. Both tubes were secured at the skin with Prolene retention sutures. FINDINGS: Both nephrostomy tubes were exchanged and formed at the level of the renal pelvis bilaterally. Nephrostogram spine laterally now demonstrate patent ureters to the level of the bladder without evidence of obstruction. Pending on course of current cancer treatment and renal function, a trial of capping of the nephrostomy tubes could be considered  bilaterally prior to potential removal. IMPRESSION: Bilateral nephrostomy tube exchange, as above. Both tubes were reconnected to gravity drainage bags today. Nephrostograms now demonstrate patent ureters to the level of the bladder bilaterally without evidence of obstruction. Depending on current cancer treatment plan and renal function, a trial of capping of the nephrostomy tubes can be considered bilaterally prior to potential removal. Electronically Signed   By: Aletta Edouard M.D.   On: 02/13/2018 10:10     Assessment and plan Patient is a 57 y.o. female with metastatic breast cancer, visceral crisis present for follow-up prior to chemotherapy treatment. 1. Metastatic breast cancer ([place])   2. Acute cystitis without hematuria   3. 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 Anemia due to antineoplastic chemotherapy    # UTI, on cipro 500mg  BID. No additional fever episodes. Leukocytosis resolved. Advise patient to finish course.  # Metastatic lobular breast cancer.  Proceed with second dose of Faslodex 500mg  today.  She is scheduled to have lumbar puncture procedure tomorrow to rule out leptomeningeal involvement.  As we discussed, she has held her eliquis for 2 days prior to procedure and resume Eliquis 2 days after procedure.   Discussed about rationale and side effects of Ibrance.  Side effects includes but not limited to bone marrow suppression/neutropenia/thrombocytopenia, infection,  gastroenterology toxicity, interstitial lung disease, etc. Patient agrees with the plan and Mary [name] has been delivered to them,  Advise patient to start taking Ibrance 125mg  on 03/05/2018 [Day 1-21 every 28 days]  Repeat labs [cbc, cmp] on 03/19/2018.   # Nephrostomy tube status: her kidney function has remained stable since tubes are capped. Expect her ureter metastasis to further respond to [facility] If her kidney function remains stable at next viist, plan send her to IR  for tube removal.  # Thrombocytosis, likely  reactive due to UTI.  # Blurry visio, she is undergoing work up at [facility]. Per note, pending lab work, she may need ocular steroid injection.  I recommend she has cbc done prior to injections to minimize her risk of infection and bleeding.   We spent sufficient time to discuss many aspect of care, questions were answered to patient's satisfaction. Total face to face encounter time for this patient visit was 25 min. >50% of the time was  spent in counseling and coordination of care.   Cc [facility] Dr.Lannaccone.   Earlie Server, MD, PhD Hematology Oncology [facility] at [name] Pager- [phone] 02/28/2018

## 2018-02-28 NOTE — Progress Notes (Signed)
03/01/2018 9:45 AM   [name] 1960-06-14 [id number]  Referring provider: [name], MD [place], [place] [address]  Chief Complaint  Patient presents with  . Follow-up    HPI: Mary [name] is a 57 yo F with a personal history of metastatic breast cancer and hydronephrosis returns discuss management of her nephrostomy tubes.    She reports of being well overall. Her last visit with [place] was on 12/26/2017.  Her last tube exchange was on 02/13/18.   At time of last exchange she had anterograde nephrostogram demonstrate patent ureters with no obstruction and capped tubed. She tolerated the procedure well.   She had labs yesterday that showed creatinine levels of 1.02. She is currently on chemotherapy and has excellent radiation response. CT scan from 02/06/2018 showed improved ascites and peritoneal nodularity.   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 During her admission, ureteral stents were placed but she developed worsening renal function and ultimately underwent placement of percutaneous nephrostomy tubes. She did receive her first dose of chemotherapy while inpatient.    She is being followed by Dr. Tasia Catchings.  PMH: Past Medical History:  Diagnosis Date  . Anxiety    takes Xanax prn  . Breast cancer [facility]) August 2013   bilateral, er/pr+  . Complication of anesthesia    pt states she had the shakes extremely bad  . Cough    at night d/t reflux;nothing productive  . Dental crowns present    x 4  . GERD (gastroesophageal reflux disease)    takes Nexium daily  . History of bronchitis    08/2011  . History of colon polyps   . Hot flashes, menopausal 08/08/2012  . IBS (irritable bowel syndrome)    immodium prn  . Insomnia    takes Ambien nightly  . Irritable bowel syndrome   . Neutropenia, drug-induced ([place])  12/15/2011  . Nocturia   . Plantar fasciitis   . S/P radiation therapy 07/03/2012 through 08/18/2038                                                        Right chest wall/regional lymph nodes 5040 cGy 28 sessions, right chest wall/mastectomy scar boost 1000 cGy 5 sessions                          . Status post chemotherapy    FEC 100 starting on 12/08/2011 - 01/18/12/single agent Taxol x12 weeks from 02/02/2012 through January 2014   . Urinary frequency   . Use of tamoxifen (Nolvadex)   . Vertigo     Surgical History: Past Surgical History:  Procedure Laterality Date  . BREAST LUMPECTOMY  2008   left  . COLONOSCOPY    . COLONOSCOPY WITH PROPOFOL N/A 10/10/2017   Procedure: COLONOSCOPY WITH PROPOFOL;  Surgeon: Lucilla Lame, MD;  Location: [facility] ENDOSCOPY;  Service: Endoscopy;  Laterality: N/A;  . CYSTOSCOPY WITH STENT PLACEMENT Bilateral 12/17/2017   Procedure: CYSTOSCOPY WITH STENT PLACEMENT;  Surgeon: Hollice Espy, MD;  Location: [facility] ORS;  Service: Urology;  Laterality: Bilateral;  . IR NEPHROSTOMY EXCHANGE LEFT  02/13/2018  . IR NEPHROSTOMY EXCHANGE RIGHT  01/02/2018  . IR NEPHROSTOMY EXCHANGE RIGHT  02/13/2018  . IR NEPHROSTOMY PLACEMENT LEFT  12/19/2017  . IR NEPHROSTOMY PLACEMENT RIGHT  12/19/2017  . MASTECTOMY    . MASTECTOMY WITH AXILLARY LYMPH NODE DISSECTION Right 05/18/2012   Procedure: MASTECTOMY WITH AXILLARY LYMPH NODE DISSECTION;  Surgeon: Adin Hector, MD;  Location: [place];  Service: General;  Laterality: Right;  Bilateral Total Mastectomy , right axillary lymph node dissection left axillary sentinel node biopsy, removal of Porta Cath  . PORT-A-CATH REMOVAL N/A 05/18/2012   Procedure: REMOVAL PORT-A-CATH;  Surgeon: Adin Hector, MD;  Location: [place];  Service: General;  Laterality: N/A;  . PORTA CATH INSERTION N/A 12/26/2017   Procedure: PORTA CATH INSERTION;  Surgeon: Katha Cabal, MD;  Location: [place] CV LAB;  Service: Cardiovascular;  Laterality:  N/A;  . PORTACATH PLACEMENT  12/02/2011   Procedure: INSERTION PORT-A-CATH;  Surgeon: Adin Hector, MD;  Location: [place];  Service: General;  Laterality: N/A;  insertion port a cath with fluoroscopy  . SIMPLE MASTECTOMY WITH AXILLARY SENTINEL NODE BIOPSY Left 05/18/2012   Procedure: SIMPLE MASTECTOMY WITH AXILLARY SENTINEL NODE BIOPSY;  Surgeon: Adin Hector, MD;  Location: [place];  Service: General;  Laterality: Left;    Home Medications:  Allergies as of 03/01/2018      Reactions   Adhesive [tape] Other (See Comments)   Redness:Please use paper tape      Medication List       Accurate as of March 01, 2018  9:45 AM. Always use your most recent med list.        amLODipine 5 MG tablet Commonly known as:  NORVASC TAKE 1 TABLET BY MOUTH DAILY.   CALTRATE 600+D PO Take 1 tablet by mouth daily.   ciprofloxacin 500 MG tablet Commonly known as:  CIPRO Take 1 tablet (500 mg total) by mouth 2 (two) times daily.   dexamethasone 4 MG tablet Commonly known as:  DECADRON Take 1 tablet (4 mg total) by mouth See admin instructions. You may take 4 mg daily for 2 days after chemotherapy if needed for nausea   diazepam 10 MG tablet Commonly known as:  VALIUM Take 1 tablet (10 mg total) by mouth at bedtime as needed for anxiety.   esomeprazole 40 MG capsule Commonly known as:  NEXIUM Take 40 mg by mouth daily as needed.   ferrous sulfate 325 (65 FE) MG EC tablet Take 1 tablet (325 mg total) by mouth daily.   furosemide 20 MG tablet Commonly known as:  LASIX Take 1 tablet (20 mg total) by mouth daily as needed.   lidocaine-prilocaine cream Commonly known as:  EMLA Apply to affected area once   loperamide 2 MG tablet Commonly known as:  IMODIUM A-D Take 2 mg by mouth 4 (four) times daily as needed for diarrhea or loose stools.   multivitamin tablet Take 1 tablet by mouth daily.   ondansetron 4 MG tablet Commonly known as:  ZOFRAN Take 1 tablet (4  mg total) by mouth every 8 (eight) hours as needed for nausea or vomiting.   oxybutynin 5 MG tablet Commonly known as:  DITROPAN Take 1 tablet (5 mg total) by mouth every 8 (eight) hours as needed for bladder spasms.   palbociclib 125 MG capsule Commonly known as:  IBRANCE Take 1 capsule (125 mg total) by mouth daily. Take whole with food. Take for 21 days on, 7 days off, repeat every 28 days.  potassium chloride SA 20 MEQ tablet Commonly known as:  K-DUR,KLOR-CON Take 1 tablet (20 mEq total) by mouth daily.   prochlorperazine 10 MG tablet Commonly known as:  COMPAZINE Take 1 tablet (10 mg total) by mouth every 6 (six) hours as needed (Nausea or vomiting).   scopolamine 1 MG/3DAYS Commonly known as:  TRANSDERM-SCOP Place 1 patch (1.5 mg total) onto the skin every 3 (three) days.   traMADol 50 MG tablet Commonly known as:  ULTRAM Take 1 tablet (50 mg total) by mouth every 6 (six) hours as needed.   traZODone 50 MG tablet Commonly known as:  DESYREL Take 0.5-1 tablets (25-50 mg total) by mouth at bedtime as needed for sleep.   vitamin C 100 MG tablet Take 100 mg by mouth daily.       Allergies:  Allergies  Allergen Reactions  . Adhesive [Tape] Other (See Comments)    Redness:Please use paper tape    Family History: Family History  Problem Relation Age of Onset  . Hypertension Mother   . Diabetes Mother   . Hypertension Maternal Grandmother   . Pancreatic cancer Paternal Grandmother        diagnosed in her 19s  . Ovarian cancer Maternal Aunt        maternal grandmother's sister  . Breast cancer Paternal Aunt        diagnosed in late 32s early 55s  . Parkinsonism Paternal Grandfather     Social History:  reports that she quit smoking about 20 years ago. Her smoking use included cigarettes. She has a 7.50 pack-year smoking history. She has never used smokeless tobacco. She reports previous alcohol use. She reports that she does not use  drugs.  ROS: UROLOGY Frequent Urination?: No Hard to postpone urination?: No Burning/pain with urination?: No Get up at night to urinate?: No Leakage of urine?: No Urine stream starts and stops?: No Trouble starting stream?: No Do you have to strain to urinate?: No Blood in urine?: No Urinary tract infection?: No Sexually transmitted disease?: No Injury to kidneys or bladder?: Yes Painful intercourse?: No Weak stream?: No Currently pregnant?: No Vaginal bleeding?: No Last menstrual period?: n  Gastrointestinal Nausea?: No Vomiting?: No Indigestion/heartburn?: No Diarrhea?: No Constipation?: Yes  Constitutional Fever: No Night sweats?: No Weight loss?: No Fatigue?: Yes  Skin Skin rash/lesions?: No Itching?: No  Eyes Blurred vision?: Yes Double vision?: No  Ears/Nose/Throat Sore throat?: No Sinus problems?: No  Hematologic/Lymphatic Swollen glands?: No Easy bruising?: No  Cardiovascular Leg swelling?: No Chest pain?: No  Respiratory Cough?: No Shortness of breath?: No  Endocrine Excessive thirst?: No  Musculoskeletal Back pain?: No Joint pain?: No  Neurological Headaches?: No Dizziness?: No  Psychologic Depression?: No Anxiety?: Yes  Physical Exam: BP 123/78   Pulse 86   Wt 142 lb (64.4 kg)   LMP 11/28/2011   BMI 24.37 kg/m   Constitutional:  Alert and oriented, No acute distress.  Company by husband. HEENT: [place] AT, moist mucus membranes.  Trachea midline, no masses. Cardiovascular: No clubbing, cyanosis, or edema. Respiratory: Normal respiratory effort, no increased work of breathing. GI: Abdomen is soft, nontender, nondistended, no abdominal masses GU: Bilateral nephrostomy tubes in place, clamped.  Sites clean dry and intact.  Each of the dressings were removed, anchoring stitches cut, coil released and bilateral tubes removed.  Dressings of 4 x 4 and Tegaderm was applied. Skin: No rashes, bruises or suspicious lesions, irritation  around the site of tube injection  Neurologic: Grossly intact,  no focal deficits, moving all 4 extremities. Psychiatric: Normal mood and affect.  Laboratory Data: Lab Results  Component Value Date   WBC 7.6 02/28/2018   HGB 10.5 (L) 02/28/2018   HCT 33.7 (L) 02/28/2018   MCV 93.6 02/28/2018   PLT 522 (H) 02/28/2018    Lab Results  Component Value Date   CREATININE 1.02 (H) 02/28/2018    Pertinent Imaging: CLINICAL DATA:  Renal failure. Recent right percutaneous nephrostomy change. History of breast cancer with chemotherapy. 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  EXAM: CT CHEST, ABDOMEN AND PELVIS WITHOUT CONTRAST  TECHNIQUE: Multidetector CT imaging of the chest, abdomen and pelvis was performed following the standard protocol without IV contrast.  COMPARISON:  PET-CT 12/01/2011.  Abdominopelvic CT 12/08/2017.  FINDINGS: CT CHEST FINDINGS  Cardiovascular: Right IJ Port-A-Cath extends to the superior cavoatrial junction. No acute vascular findings are demonstrated on noncontrast imaging. The heart size is normal. There is no pericardial effusion.  Mediastinum/Nodes: There are no enlarged mediastinal, hilar, axillary or internal mammary lymph nodes. There are postsurgical changes in both axilla. There is a stable 11 mm low-density right thyroid nodule on image number 12, unlikely to be clinically significant. The trachea and esophagus appear unremarkable.  Lungs/Pleura: Small dependent right pleural effusion. There is new linear right apical opacity on images 21 through 29 of series 3, likely the sequela of prior radiation therapy. There is mild linear scarring or atelectasis at both lung bases. No confluent airspace opacity, suspicious pulmonary nodule or endobronchial lesion.  Musculoskeletal/Chest wall: No chest wall mass status post bilateral mastectomy. There are external breast prostheses bilaterally. The bones are  diffusely heterogeneous. No focal lytic or blastic osseous lesions are identified.  CT ABDOMEN AND PELVIS FINDINGS  Hepatobiliary: There are scattered small low-density hepatic lesions which are grossly stable. No definite new or enlarging lesions identified on non-contrast imaging. No evidence of gallstones, gallbladder wall thickening or biliary dilatation.  Pancreas: Unremarkable. No pancreatic ductal dilatation or surrounding inflammatory changes.  Spleen: Normal in size without focal abnormality.  Adrenals/Urinary Tract: Both adrenal glands appear normal. There are bilateral percutaneous nephrostomies which appear well positioned. The previously demonstrated bilateral hydronephrosis and hydroureter have nearly completely resolved. There is improved perinephric soft tissue stranding bilaterally without focal fluid collection. There is no evidence of urinary tract calculus. The urinary bladder is decompressed without significant change in previously demonstrated left-sided bladder wall thickening.  Stomach/Bowel: No evidence of bowel wall thickening, distention or surrounding inflammatory change. There is moderate stool throughout the colon.  Vascular/Lymphatic: There are multiple small retroperitoneal and mesenteric lymph nodes which are not pathologically enlarged. There is minimal aortic atherosclerosis without acute vascular findings on noncontrast imaging.  Reproductive: The uterus is retroverted without focal abnormality. There is a stable 3.5 x 2.7 cm right adnexal lesion on image 58/7. The left ovary appears normal.  Other: Ascites and mesenteric edema have improved. No increasing peritoneal nodularity identified.  Musculoskeletal: There is diffuse marrow heterogeneity with scattered sclerotic lesions, largely stable from remote PET-CT. There is a lucent lesion in the right iliac bone (image 47/7) which demonstrates increased sclerosis since CT of 2  months ago, possibly a treated metastasis.  IMPRESSION: 1. Near-complete resolution of bilateral hydronephrosis status post percutaneous nephrostomy. Stable asymmetric bladder wall thickening on the left secondary to known metastatic disease. 2. Ascites and peritoneal nodularity appear mildly improved. Stable right adnexal lesion, potentially a metastasis. 3. Possible treated osseous metastatic disease in the right iliac bone.  The bones are heterogeneous without other focal lesions. 4. No definite evidence of thoracic metastatic disease.   Electronically Signed   By: Richardean Sale M.D.   On: 02/06/2018 14:42  I have reviewed CT personally and with pt.   Procedure Nephrostomy tubes were removed. She tolerated the procedure well.  Please see above.  Assessment & Plan:   1. Other hydronephrosis  Bilateral percutaneous nephrostomy tubes were removed CT scan from 02/06/2018 showed improved ascites and peritoneal nodularity  To continue with radiologist for CT scans; refer back to [place] as needed   2. Metastatic breast cancer  Metastatic breast cancer including involvement of the bladder and bilateral ureteral obstruction Excellent radiation results from chemotherapy  Management as above  Return if symptoms worsen or fail to improve.  [facility] [address], [place], [place] [address] [phone]  I, Lucas Mallow, am acting as a scribe for Dr. Hollice Espy,  I have reviewed the above documentation for accuracy and completeness, and I agree with the above.   Hollice Espy, MD

## 2018-03-01 ENCOUNTER — Ambulatory Visit
Admission: RE | Admit: 2018-03-01 | Discharge: 2018-03-01 | Disposition: A | Discharge status: Home / Self Care | Payer: 59 | Source: Ambulatory Visit | Attending: Oncology | Admitting: Oncology

## 2018-03-01 ENCOUNTER — Ambulatory Visit (INDEPENDENT_AMBULATORY_CARE_PROVIDER_SITE_OTHER): Payer: 59 | Admitting: Urology

## 2018-03-01 ENCOUNTER — Telehealth: Payer: Self-pay | Admitting: *Deleted

## 2018-03-01 ENCOUNTER — Encounter: Payer: Self-pay | Admitting: Urology

## 2018-03-01 VITALS — BP 123/78 | HR 86 | Wt 142.0 lb

## 2018-03-01 DIAGNOSIS — C7932 Secondary malignant neoplasm of cerebral meninges: Secondary | ICD-10-CM | POA: Diagnosis not present

## 2018-03-01 DIAGNOSIS — C50919 Malignant neoplasm of unspecified site of unspecified female breast: Secondary | ICD-10-CM | POA: Diagnosis not present

## 2018-03-01 DIAGNOSIS — C50411 Malignant neoplasm of upper-outer quadrant of right female breast: Secondary | ICD-10-CM | POA: Diagnosis not present

## 2018-03-01 DIAGNOSIS — N1339 Other hydronephrosis: Secondary | ICD-10-CM | POA: Diagnosis not present

## 2018-03-01 DIAGNOSIS — R42 Dizziness and giddiness: Secondary | ICD-10-CM | POA: Diagnosis not present

## 2018-03-01 HISTORY — DX: Major depressive disorder, single episode, unspecified: F32.9

## 2018-03-01 HISTORY — PX: NEPHROSTOMY TUBE REMOVAL: SHX6794

## 2018-03-01 HISTORY — PX: LUMBAR PUNCTURE: SHX1985

## 2018-03-01 HISTORY — DX: Chronic kidney disease, unspecified: N18.9

## 2018-03-01 HISTORY — DX: Depression, unspecified: F32.A

## 2018-03-01 LAB — CSF CELL COUNT WITH DIFFERENTIAL
Eosinophils, CSF: 0 %
Lymphs, CSF: 42 %
Monocyte-Macrophage-Spinal Fluid: 58 %
RBC COUNT CSF: 0 /mm3 (ref 0–3)
SEGMENTED NEUTROPHILS-CSF: 0 %
Tube #: 3
WBC, CSF: 17 /mm3 (ref 0–5)

## 2018-03-01 LAB — GLUCOSE, CSF: GLUCOSE CSF: 23 mg/dL — AB (ref 40–70)

## 2018-03-01 LAB — PROTEIN, CSF: Total  Protein, CSF: 216 mg/dL — ABNORMAL HIGH (ref 15–45)

## 2018-03-01 LAB — PROTIME-INR
INR: 1.11
Prothrombin Time: 14.2 seconds (ref 11.4–15.2)

## 2018-03-01 LAB — APTT: aPTT: 32 seconds (ref 24–36)

## 2018-03-01 MED ORDER — ACETAMINOPHEN 500 MG PO TABS
1000.0000 mg | ORAL_TABLET | Freq: Four times a day (QID) | ORAL | Status: DC | PRN
  Filled 2018-03-01: qty 2

## 2018-03-01 MED ORDER — LIDOCAINE HCL (PF) 1 % IJ SOLN
5.0000 mL | Freq: Once | INTRAMUSCULAR | Status: AC
  Administered 2018-03-01: 5 mL
  Filled 2018-03-01: qty 5

## 2018-03-01 NOTE — Telephone Encounter (Signed)
Becky from [place] called reporting that lab has called with several abn readings form patient LP today  )   Ref Range & Units 12:51  Total Protein, CSF 15 - 45 mg/dL 216High    Comment: RESULT CONFIRMED BY MANUAL DILUTION  Performed at [facility], [address] [place], [place] [address]   Resulting Agency  [facility] CLIN LAB      Specimen Collected: 03/01/18 12:51       )   Ref Range & Units 12:51  Glucose, CSF 40 - 70 mg/dL 23Low Panic    Comment: CRITICAL RESULT CALLED TO, READ BACK BY AND VERIFIED WITH  BECKY SANCHEZ 03/01/18 @ 1431 [place]  Performed at [facility], [address] [place], [place] [address]   Resulting Agency  [facility] CLIN LAB      Specimen Collected: 03/01/18 12:51  Last Resulted: 03/01/18 14:32       )   Ref Range & Units 12:51  Tube #  3   Color, CSF COLORLESS COLORLESS   Appearance, CSF CLEAR CLEARAbnormal    RBC Count, CSF 0 - 3 /cu mm 0   WBC, CSF 0 - 5 /cu mm 17High Panic    Comment: CRITICAL RESULT CALLED TO, READ BACK BY AND VERIFIED WITH:  BECKY SANCHEZ ON 03/01/18 AT 1449 QSD   Segmented Neutrophils-CSF % 0   Lymphs, CSF % 42   Monocyte-Macrophage-Spinal Fluid % 58   Eosinophils, CSF % 0   Comment: Performed at [facility], [address], [place], [place] [address]  Resulting Agency  [facility] CLIN LAB      Specimen Collected: 03/01/18 12:51  Last Resulted: 03/01/18 14:50

## 2018-03-01 NOTE — OR Nursing (Addendum)
Dressings on back from nephrostomy tube removal, has evidence of drainage from left site. Replaced secure dressing and cleaned site with alcohol before placing clean dressing. Drainage is light yellow in color.   Dr. Posey Pronto from IR came to monitor patient and stated that patient is okay to be released.   Also, several attempts to contact oncology (Dr. Tasia Catchings) to inform the physician of critical labs that was called to [facility]. Left a secure chat message for Dr. Tasia Catchings to look into results.

## 2018-03-01 NOTE — OR Nursing (Signed)
Received a call from the Lab that the blue top tube was overfilled and would need to be redrawn. They would notify their staff to come and redraw. (lab tech drew first time)

## 2018-03-01 NOTE — Progress Notes (Signed)
Returned to [facility] via bed.  Call light at bedside, side rails up, family at bedside.  HOB elevated 15 degrees for patient to have sips of water and crackers.

## 2018-03-01 NOTE — Discharge Instructions (Signed)
AMBULATORY SURGERY  DISCHARGE INSTRUCTIONS   1) The drugs that you were given will stay in your system until tomorrow so for the next 24 hours you should not:  A) Drive an automobile B) Make any legal decisions C) Drink any alcoholic beverage    2) You may resume regular meals tomorrow.  Today it is better to start with liquids and gradually work up to solid foods.  You may eat anything you prefer, but it is better to start with liquids, then soup and crackers, and gradually work up to solid foods.   3) Please notify your doctor immediately if you have any unusual bleeding, trouble breathing, redness and pain at the surgery site, drainage, fever, or pain not relieved by medication.   Additional Instructions: Continue to drink a lot of fluids today.  Lay low at home and do not perform any heavy or physical activities today.  You may sit up this afternoon but preferably, remain laying on back.  You may sleep in any position for nighttime.        Continue to assess wounds on back from nephrostomy tube removal.  You have been provided with sterile gauze and 2 x 2's along with tegaderm to keep it covered while draining.  Wash your hands before and after changing dressings.    Eat as you wish today.  Drink more liquids.   Please contact your physician with any problems or Same Day Surgery at [phone], Monday through Friday 6 am to 4 pm, or [facility] number at [phone].

## 2018-03-02 ENCOUNTER — Telehealth: Payer: Self-pay | Admitting: Oncology

## 2018-03-02 ENCOUNTER — Other Ambulatory Visit: Payer: Self-pay | Admitting: Oncology

## 2018-03-02 ENCOUNTER — Ambulatory Visit: Payer: 59

## 2018-03-02 ENCOUNTER — Telehealth: Payer: Self-pay | Admitting: *Deleted

## 2018-03-02 ENCOUNTER — Ambulatory Visit: Admission: RE | Admit: 2018-03-02 | Payer: 59 | Source: Ambulatory Visit

## 2018-03-02 DIAGNOSIS — R839 Unspecified abnormal finding in cerebrospinal fluid: Secondary | ICD-10-CM

## 2018-03-02 DIAGNOSIS — C50919 Malignant neoplasm of unspecified site of unspecified female breast: Secondary | ICD-10-CM

## 2018-03-02 NOTE — Telephone Encounter (Signed)
Called patient and discussed with her about the prelim results of CSF study. Advise patient to hold starting Ibrance next Monday.  She can resume Eliquis.  Repeat MRI brain to r/o abcess or CNS mets. Last MRI brain was done in Oct 2019 and was negative. She voices understanding.   I also have discussed with on call physician Dr.Fitzgerald about her case. 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 Dr.Fitzgerald will review case and add additional studies if indicated. Less likely acute bacterial meningitis. Patient is afebrile. ID agrees with holding bacterial meningitis treatment and wait for more results, recommend repeat MRI brain urgently.

## 2018-03-02 NOTE — Telephone Encounter (Signed)
Husband called asking about abnormal results he was informed of by [facility] nurse prior to discharge yesterday. Asking what needs to be done. I know yesterday you said something about calling Infectious Disease. Please advise

## 2018-03-02 NOTE — Telephone Encounter (Signed)
Spoke with pt's husband, Jenny Reichmann, let him know that Dr Tasia Catchings is working on talking to the ID physician and we will hopefully be back in touch with him today.  Called ID office again and left vm to return Dr Collie Siad call.

## 2018-03-05 ENCOUNTER — Telehealth: Payer: Self-pay | Admitting: *Deleted

## 2018-03-05 ENCOUNTER — Ambulatory Visit
Admission: RE | Admit: 2018-03-05 | Discharge: 2018-03-05 | Disposition: A | Discharge status: Home / Self Care | Payer: 59 | Source: Ambulatory Visit | Attending: Oncology | Admitting: Oncology

## 2018-03-05 ENCOUNTER — Telehealth: Payer: Self-pay | Admitting: Urology

## 2018-03-05 DIAGNOSIS — C50919 Malignant neoplasm of unspecified site of unspecified female breast: Secondary | ICD-10-CM | POA: Diagnosis not present

## 2018-03-05 DIAGNOSIS — R839 Unspecified abnormal finding in cerebrospinal fluid: Secondary | ICD-10-CM | POA: Insufficient documentation

## 2018-03-05 LAB — CSF CULTURE W GRAM STAIN: Gram Stain: NONE SEEN

## 2018-03-05 LAB — CSF CULTURE: CULTURE: NO GROWTH

## 2018-03-05 MED ORDER — GADOBUTROL 1 MMOL/ML IV SOLN
6.5000 mL | Freq: Once | INTRAVENOUS | Status: AC | PRN
  Administered 2018-03-05: 6.5 mL via INTRAVENOUS

## 2018-03-05 NOTE — Telephone Encounter (Signed)
Patient's husband, Jenny Reichmann, called the office today wanting to know when patient can take a shower.  She has nephrostomy tubes and not sure if she can get them wet.

## 2018-03-05 NOTE — Telephone Encounter (Signed)
Mychart msg sent

## 2018-03-05 NOTE — Telephone Encounter (Signed)
Patient no show for her MRI Brain

## 2018-03-05 NOTE — Telephone Encounter (Signed)
MRI was rescheduled by colette today, MRI is scheduled for today, per colette pt notified

## 2018-03-06 ENCOUNTER — Other Ambulatory Visit: Payer: Self-pay | Admitting: Radiology

## 2018-03-07 LAB — CYTOLOGY - NON PAP

## 2018-03-08 ENCOUNTER — Inpatient Hospital Stay (HOSPITAL_BASED_OUTPATIENT_CLINIC_OR_DEPARTMENT_OTHER): Payer: 59 | Admitting: Oncology

## 2018-03-08 ENCOUNTER — Encounter: Payer: Self-pay | Admitting: Oncology

## 2018-03-08 ENCOUNTER — Telehealth: Payer: Self-pay | Admitting: Pharmacy Technician

## 2018-03-08 ENCOUNTER — Telehealth: Payer: Self-pay | Admitting: Pharmacist

## 2018-03-08 ENCOUNTER — Other Ambulatory Visit: Payer: Self-pay

## 2018-03-08 VITALS — BP 109/71 | HR 74 | Temp 96.3°F | Resp 18 | Wt 142.0 lb

## 2018-03-08 DIAGNOSIS — Z803 Family history of malignant neoplasm of breast: Secondary | ICD-10-CM

## 2018-03-08 DIAGNOSIS — T451X5A Adverse effect of antineoplastic and immunosuppressive drugs, initial encounter: Secondary | ICD-10-CM

## 2018-03-08 DIAGNOSIS — T451X5S Adverse effect of antineoplastic and immunosuppressive drugs, sequela: Secondary | ICD-10-CM | POA: Diagnosis not present

## 2018-03-08 DIAGNOSIS — D6481 Anemia due to antineoplastic chemotherapy: Secondary | ICD-10-CM | POA: Diagnosis not present

## 2018-03-08 DIAGNOSIS — C7949 Secondary malignant neoplasm of other parts of nervous system: Secondary | ICD-10-CM

## 2018-03-08 DIAGNOSIS — Z87891 Personal history of nicotine dependence: Secondary | ICD-10-CM

## 2018-03-08 DIAGNOSIS — I10 Essential (primary) hypertension: Secondary | ICD-10-CM

## 2018-03-08 DIAGNOSIS — Z79899 Other long term (current) drug therapy: Secondary | ICD-10-CM

## 2018-03-08 DIAGNOSIS — Z9013 Acquired absence of bilateral breasts and nipples: Secondary | ICD-10-CM | POA: Diagnosis not present

## 2018-03-08 DIAGNOSIS — C50919 Malignant neoplasm of unspecified site of unspecified female breast: Secondary | ICD-10-CM

## 2018-03-08 DIAGNOSIS — C50411 Malignant neoplasm of upper-outer quadrant of right female breast: Secondary | ICD-10-CM | POA: Diagnosis not present

## 2018-03-08 DIAGNOSIS — R5383 Other fatigue: Secondary | ICD-10-CM

## 2018-03-08 DIAGNOSIS — K219 Gastro-esophageal reflux disease without esophagitis: Secondary | ICD-10-CM

## 2018-03-08 DIAGNOSIS — F419 Anxiety disorder, unspecified: Secondary | ICD-10-CM

## 2018-03-08 DIAGNOSIS — Z9221 Personal history of antineoplastic chemotherapy: Secondary | ICD-10-CM | POA: Diagnosis not present

## 2018-03-08 DIAGNOSIS — Z936 Other artificial openings of urinary tract status: Secondary | ICD-10-CM

## 2018-03-08 DIAGNOSIS — Z7901 Long term (current) use of anticoagulants: Secondary | ICD-10-CM

## 2018-03-08 DIAGNOSIS — Z923 Personal history of irradiation: Secondary | ICD-10-CM | POA: Diagnosis not present

## 2018-03-08 DIAGNOSIS — Z170 Estrogen receptor positive status [ER+]: Secondary | ICD-10-CM

## 2018-03-08 DIAGNOSIS — C7911 Secondary malignant neoplasm of bladder: Secondary | ICD-10-CM | POA: Diagnosis not present

## 2018-03-08 DIAGNOSIS — R5381 Other malaise: Secondary | ICD-10-CM

## 2018-03-08 DIAGNOSIS — G039 Meningitis, unspecified: Secondary | ICD-10-CM

## 2018-03-08 DIAGNOSIS — Z79818 Long term (current) use of other agents affecting estrogen receptors and estrogen levels: Secondary | ICD-10-CM | POA: Diagnosis not present

## 2018-03-08 DIAGNOSIS — Z86718 Personal history of other venous thrombosis and embolism: Secondary | ICD-10-CM

## 2018-03-08 DIAGNOSIS — G96198 Other disorders of meninges, not elsewhere classified: Secondary | ICD-10-CM

## 2018-03-08 DIAGNOSIS — N136 Pyonephrosis: Secondary | ICD-10-CM | POA: Diagnosis not present

## 2018-03-08 DIAGNOSIS — Z95828 Presence of other vascular implants and grafts: Secondary | ICD-10-CM

## 2018-03-08 DIAGNOSIS — Z8041 Family history of malignant neoplasm of ovary: Secondary | ICD-10-CM

## 2018-03-08 NOTE — Telephone Encounter (Signed)
Oral Oncology Patient Advocate Encounter  Received notification from [employer] that prior authorization for Verzenio is required.  PA submitted on CoverMyMeds Key [id number]  Status is pending  Oral Oncology Clinic will continue to follow.  [place] Patient [place] Phone [phone] Fax [phone] 03/08/2018 4:31 PM'

## 2018-03-08 NOTE — Progress Notes (Signed)
Patient here for follow up. States she feels "pretty good."

## 2018-03-08 NOTE — Progress Notes (Addendum)
Hematology/Oncology Follow Up Note [facility]  [phone] [phone]  Patient Care Team: [name], MD as PCP - General (Internal Medicine)   Name of the patient: Mary [name]  [id number]  1960/09/24   REASON FOR VISIT  follow-up for management of metastatic breast cancer.  Pertinent oncology history # She was diagnosed with right upper quadrant breast in August 2013, status post neoadjuvant chemotherapy with FEC x4 followed by weekly Taxol x2, underwent bilateral mastectomy [February 2014], right ALND, and left sentinel LN biopsy.  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  Tis N0.  Patient also received adjuvant radiation.   Patient was started initially on tamoxifen plus ovarian suppression with Zoladex in June 2014,  and changed to Aromasin plus Zoladex monthly since June 2015.follows up with Dr.Gudina Loleta Dicker at [facility] in [place]. reports that she twisted and sprained her left ankle when getting out of her car on 12/11/2017.  Ankle has been swollen.  #Patient presented to emergency room due to abnormal lab values.  Calcium level was 13.9 with creatinine 1.63.  Also endorses right lower quadrant/right flank/right lower back pain for the past couple of days.  Also having had episodes of dizziness which were diagnosed as vertigo since April 2019.  She had a negative MRI brain done at that time which were essentially negative for acute findings.  In the emergency room CT renal stone study were independently reviewed by me which showed bilateral hydronephrosis, due to the stricture at the level of UPJ's.  Masslike thickening of the anterior and the lateral bladder wall suspicious for metastatic implant.  Mesenteric, omental, peritoneal implants with small ascites and diffuse mesenteric edema.  Small to moderate bilateral pleural  effusion with associated compressive atelectasis of the lung.  Diffuse small osseous skeletal but stated disease.  Patient was seen and evaluated by urology Dr. Erlene Quan.  She is status post bilateral ureter stent placement and infiltrative bladder mass biopsy. Unfortunately her creatinine increased further during the admission and eventually she got bilateral percutaneous nephrostomy tube placed.  Creatinine gradually gets better after hydration. For her hypercalcemia, patient received 1 dose of calcitonin and 1 dose of Zometa.  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 She has visceral crisis with metastatic breast cancer.  In order to avoid further delay outpatient, she was started on first dose of Taxol 80 mg/m on 12/22/2017 after detailed discussion of chemotherapy side effects and complications.  #Left lower extremity DVT, she was anticoagulated with heparin drip.  Switched to Eliquis 5 mg twice daily outpatient. # Patient previously follows up at [facility] center.  She prefers to follow-up with [place] [facility] as it is closer to her home.  # # Seen [facility] for blurry vision, lab work up in process, pending plan to start ocular steroid injections.  INTERVAL HISTORY 57 year old female with metastatic breast lobular carcinoma presents for management of metastatic breast cancer   # During the interval, lumbar puncture was done and CSF fluid study showed negative culture, low glucose, elevated protein, elevated wbc.  Cytology is positive for malignancy, consistent with breast origin.  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   # Denies any flank  pain, fever or chills. Good urine production.  # Vertigo, she uses scopolamine patches with symptom relieve.   Review of Systems  Constitutional: Positive for malaise/fatigue. Negative for chills, fever and weight loss.  HENT: Negative for nosebleeds and sore throat.   Eyes: Negative for double vision, photophobia and redness.       Blurry vision  Respiratory: Negative for cough, shortness of breath and wheezing.   Cardiovascular: Negative for chest pain, palpitations, orthopnea and leg swelling.  Gastrointestinal: Negative for abdominal pain, blood in stool, nausea and vomiting.  Genitourinary: Negative for dysuria, flank pain, frequency and urgency.  Musculoskeletal: Negative for back pain, myalgias and neck pain.  Skin: Negative for itching and rash.  Neurological: Negative for dizziness, tingling and tremors.  Endo/Heme/Allergies: Negative for environmental allergies. Does not bruise/bleed easily.  Psychiatric/Behavioral: Negative for depression and hallucinations.      Allergies  Allergen Reactions  . Adhesive [Tape] Other (See Comments)    Redness:Please use paper tape     Past Medical History:  Diagnosis Date  . Anxiety    takes Xanax prn  . Breast cancer [facility]) August 2013   bilateral, er/pr+, bladder (02/2018..mets from breast ca)  . Chronic kidney disease 01/2018   recent shut down of kidneys with labs out of whack  . Complication of anesthesia    pt states she had the shakes extremely bad  . Cough    at night d/t reflux;nothing productive  . Dental crowns present    x 4  . Depression   . GERD (gastroesophageal reflux disease)    takes Nexium daily  . History of bronchitis    08/2011  . History of colon polyps   . Hot flashes, menopausal 08/08/2012  . IBS (irritable bowel syndrome)    immodium prn  . Insomnia    takes Ambien nightly  . Irritable bowel syndrome   . Neutropenia, drug-induced ([place]) 12/15/2011  . Nocturia   . Plantar fasciitis   . S/P  radiation therapy 07/03/2012 through 08/18/2038                                                        Right chest wall/regional lymph nodes 5040 cGy 28 sessions, right chest wall/mastectomy scar boost 1000 cGy 5 sessions                          . Status post chemotherapy    FEC 100 starting on 12/08/2011 - 01/18/12/single agent Taxol x12 weeks from 02/02/2012 through January 2014   . Urinary frequency   . Use of tamoxifen (Nolvadex)    now stopped. will be starting ibrance on 03/05/18  . Vertigo      Past Surgical History:  Procedure Laterality Date  . BREAST LUMPECTOMY  2008   left  . COLONOSCOPY    . COLONOSCOPY WITH PROPOFOL N/A 10/10/2017   Procedure: COLONOSCOPY WITH PROPOFOL;  Surgeon: Lucilla Lame, MD;  Location: [facility] ENDOSCOPY;  Service: Endoscopy;  Laterality: N/A;  . CYSTOSCOPY WITH STENT PLACEMENT Bilateral 12/17/2017   Procedure: CYSTOSCOPY WITH STENT PLACEMENT;  Surgeon: Hollice Espy, MD;  Location: [facility] ORS;  Service: Urology;  Laterality: Bilateral;  . IR NEPHROSTOMY EXCHANGE LEFT  02/13/2018  . IR NEPHROSTOMY EXCHANGE  RIGHT  01/02/2018  . IR NEPHROSTOMY EXCHANGE RIGHT  02/13/2018  . IR NEPHROSTOMY PLACEMENT LEFT  12/19/2017  . IR NEPHROSTOMY PLACEMENT RIGHT  12/19/2017  . LUMBAR PUNCTURE  03/01/2018  . MASTECTOMY Bilateral 2013  . MASTECTOMY WITH AXILLARY LYMPH NODE DISSECTION Right 05/18/2012   Procedure: MASTECTOMY WITH AXILLARY LYMPH NODE DISSECTION;  Surgeon: Adin Hector, MD;  Location: [place];  Service: General;  Laterality: Right;  Bilateral Total Mastectomy , right axillary lymph node dissection left axillary sentinel node biopsy, removal of Porta Cath  . NEPHROSTOMY TUBE REMOVAL Bilateral 03/01/2018   removed tubes in dr. Erlene Quan office  . PORT-A-CATH REMOVAL N/A 05/18/2012   Procedure: REMOVAL PORT-A-CATH;  Surgeon: Adin Hector, MD;  Location: [place];  Service: General;  Laterality: N/A;  . PORTA CATH INSERTION N/A 12/26/2017   Procedure: PORTA CATH  INSERTION;  Surgeon: Katha Cabal, MD;  Location: [place] [facility];  Service: Cardiovascular;  Laterality: N/A;  . PORTACATH PLACEMENT  12/02/2011   Procedure: INSERTION PORT-A-CATH;  Surgeon: Adin Hector, MD;  Location: [place];  Service: General;  Laterality: N/A;  insertion port a cath with fluoroscopy  . SIMPLE MASTECTOMY WITH AXILLARY SENTINEL NODE BIOPSY Left 05/18/2012   Procedure: SIMPLE MASTECTOMY WITH AXILLARY SENTINEL NODE BIOPSY;  Surgeon: Adin Hector, MD;  Location: [place];  Service: General;  Laterality: Left;    Social History   Socioeconomic History  . Marital status: Married    Spouse name: Jenny Reichmann  . Number of children: 0  . Years of education: Not on file  . Highest education level: Not on file  Occupational History  . Occupation: Network engineer II    Employer: [employer]    Comment: on fmla d/t seizure in 10/19  Social Needs  . Financial resource strain: Not on file  . Food insecurity:    Worry: Not on file    Inability: Not on file  . Transportation needs:    Medical: Not on file    Non-medical: Not on file  Tobacco Use  . Smoking status: Former Smoker    Packs/day: 0.50    Years: 15.00    Pack years: 7.50    Types: Cigarettes    Last attempt to quit: 03/21/1997    Years since quitting: 20.9  . Smokeless tobacco: Never Used  Substance and Sexual Activity  . Alcohol use: Not Currently    Alcohol/week: 0.0 standard drinks    Comment: occasionally wine  . Drug use: No  . Sexual activity: Yes    Birth control/protection: None  Lifestyle  . Physical activity:    Days per week: Not on file    Minutes per session: Not on file  . Stress: Not on file  Relationships  . Social connections:    Talks on phone: Not on file    Gets together: Not on file    Attends religious service: Not on file    Active member of club or organization: Not on file    Attends meetings of clubs or organizations: Not on file    Relationship status:  Not on file  . Intimate partner violence:    Fear of current or ex partner: Not on file    Emotionally abused: Not on file    Physically abused: Not on file    Forced sexual activity: Not on file  Other Topics Concern  . Not on file  Social History Narrative   Married to [employer]  No children 80 years old with fmp.  20 year use of birth control pills.      Family History  Problem Relation Age of Onset  . Hypertension Mother   . Diabetes Mother   . Hypertension Maternal Grandmother   . Pancreatic cancer Paternal Grandmother        diagnosed in her 32s  . Ovarian cancer Maternal Aunt        maternal grandmother's sister  . Breast cancer Paternal Aunt        diagnosed in late 78s early 20s  . Parkinsonism Paternal Grandfather      Current Outpatient Medications:  .  amLODipine (NORVASC) 5 MG tablet, TAKE 1 TABLET BY MOUTH DAILY., Disp: 90 tablet, Rfl: 1 .  apixaban (ELIQUIS) 2.5 MG TABS tablet, Take 2.5 mg by mouth 2 (two) times daily., Disp: , Rfl:  .  Ascorbic Acid (VITAMIN C) 100 MG tablet, Take 100 mg by mouth daily.  , Disp: , Rfl:  .  Calcium Carbonate-Vitamin D (CALTRATE 600+D PO), Take 1 tablet by mouth daily., Disp: , Rfl:  .  dexamethasone (DECADRON) 4 MG tablet, Take 1 tablet (4 mg total) by mouth See admin instructions. You may take 4 mg daily for 2 days after chemotherapy if needed for nausea, Disp: 30 tablet, Rfl: 0 .  diazepam (VALIUM) 10 MG tablet, Take 1 tablet (10 mg total) by mouth at bedtime as needed for anxiety., Disp: 30 tablet, Rfl: 5 .  esomeprazole (NEXIUM) 40 MG capsule, Take 40 mg by mouth daily as needed. , Disp: , Rfl:  .  ferrous sulfate 325 (65 FE) MG EC tablet, Take 1 tablet (325 mg total) by mouth daily., Disp: 30 tablet, Rfl: 3 .  fulvestrant (FASLODEX) 250 MG/5ML injection, Inject 250 mg into the muscle every 14 (fourteen) days. One injection each buttock over 1-2 minutes. Warm prior to use., Disp: , Rfl:  .  furosemide (LASIX) 20 MG tablet,  Take 1 tablet (20 mg total) by mouth daily as needed., Disp: 30 tablet, Rfl: 1 .  goserelin (ZOLADEX) 3.6 MG injection, Inject 3.6 mg into the skin every 28 (twenty-eight) days., Disp: , Rfl:  .  lidocaine-prilocaine (EMLA) cream, Apply to affected area once, Disp: 30 g, Rfl: 3 .  loperamide (IMODIUM A-D) 2 MG tablet, Take 2 mg by mouth 4 (four) times daily as needed for diarrhea or loose stools. , Disp: , Rfl:  .  Multiple Vitamin (MULTIVITAMIN) tablet, Take 1 tablet by mouth daily.  , Disp: , Rfl:  .  ondansetron (ZOFRAN) 4 MG tablet, Take 1 tablet (4 mg total) by mouth every 8 (eight) hours as needed for nausea or vomiting., Disp: 20 tablet, Rfl: 0 .  oxybutynin (DITROPAN) 5 MG tablet, Take 1 tablet (5 mg total) by mouth every 8 (eight) hours as needed for bladder spasms., Disp: 20 tablet, Rfl: 0 .  palbociclib (IBRANCE) 125 MG capsule, Take 1 capsule (125 mg total) by mouth daily. Take whole with food. Take for 21 days on, 7 days off, repeat every 28 days., Disp: 21 capsule, Rfl: 2 .  potassium chloride SA (K-DUR,KLOR-CON) 20 MEQ tablet, Take 1 tablet (20 mEq total) by mouth daily., Disp: 30 tablet, Rfl: 0 .  prochlorperazine (COMPAZINE) 10 MG tablet, Take 1 tablet (10 mg total) by mouth every 6 (six) hours as needed (Nausea or vomiting)., Disp: 30 tablet, Rfl: 1 .  scopolamine (TRANSDERM-SCOP) 1 MG/3DAYS, Place 1 patch (1.5 mg total) onto the skin every 3 (  three) days., Disp: 10 patch, Rfl: 0 .  traMADol (ULTRAM) 50 MG tablet, Take 1 tablet (50 mg total) by mouth every 6 (six) hours as needed., Disp: 60 tablet, Rfl: 0 .  traZODone (DESYREL) 50 MG tablet, Take 0.5-1 tablets (25-50 mg total) by mouth at bedtime as needed for sleep., Disp: 90 tablet, Rfl: 1  Physical exam: ECOG 1  Vitals:   03/08/18 0947  BP: 109/71  Pulse: 74  Resp: 18  Temp: (!) 96.3 F (35.7 C)  TempSrc: Tympanic  Weight: 141 lb 15.6 oz (64.4 kg)   Physical Exam Constitutional:      General: She is not in acute  distress. HENT:     Head: Normocephalic and atraumatic.  Eyes:     General: No scleral icterus.    Pupils: Pupils are equal, round, and reactive to light.  Neck:     Musculoskeletal: Normal range of motion and neck supple.  Cardiovascular:     Rate and Rhythm: Normal rate and regular rhythm.     Heart sounds: Normal heart sounds.  Pulmonary:     Effort: Pulmonary effort is normal. No respiratory distress.     Breath sounds: No wheezing.  Abdominal:     General: Bowel sounds are normal. There is no distension.     Palpations: Abdomen is soft. There is no mass.     Tenderness: There is no abdominal tenderness.  Musculoskeletal: Normal range of motion.        General: No deformity.  Skin:    General: Skin is warm and dry.     Findings: No erythema or rash.  Neurological:     Mental Status: She is alert and oriented to person, place, and time.     Cranial Nerves: No cranial nerve deficit.     Coordination: Coordination normal.  Psychiatric:        Behavior: Behavior normal.        Thought Content: Thought content normal.     CMP Latest Ref Rng & Units 02/28/2018  Glucose 70 - 99 mg/dL 113(H)  BUN 6 - 20 mg/dL 9  Creatinine 0.44 - 1.00 mg/dL 1.02(H)  Sodium 135 - 145 mmol/L 143  Potassium 3.5 - 5.1 mmol/L 3.8  Chloride 98 - 111 mmol/L 105  CO2 22 - 32 mmol/L 30  Calcium 8.9 - 10.3 mg/dL 8.8(L)  Total Protein 6.5 - 8.1 g/dL 7.0  Total Bilirubin 0.3 - 1.2 mg/dL 0.2(L)  Alkaline Phos 38 - 126 U/L 95  AST 15 - 41 U/L 26  ALT 0 - 44 U/L 26   CBC Latest Ref Rng & Units 02/28/2018  WBC 4.0 - 10.5 K/uL 7.6  Hemoglobin 12.0 - 15.0 g/dL 10.5(L)  Hematocrit 36.0 - 46.0 % 33.7(L)  Platelets 150 - 400 K/uL 522(H)    Mr Jeri Cos Wo Contrast  Result Date: 03/05/2018 CLINICAL DATA:  57 y/o F; recurrent breast cancer. Abnormal lumbar puncture 03/01/2018. EXAM: MRI HEAD WITHOUT AND WITH CONTRAST TECHNIQUE: Multiplanar, multiecho pulse sequences of the brain and surrounding  structures were obtained without and with intravenous contrast. CONTRAST:  6.5 cc Gadavist. COMPARISON:  01/03/2018 MRI of the head. 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 No reduced diffusion to suggest acute or early subacute infarction. No new focus of susceptibility hypointensity to indicate interval intracranial hemorrhage. No mass effect, extra-axial collection, or hydrocephalus. Mild stable diffuse volume loss of the brain. After administration of intravenous contrast there is subtle  enhancement in the fundus of right internal auditory canal and at the porus acusticus has of left internal auditory canal (series 16 image 46 and 48). Vascular: Normal flow voids. Skull and upper cervical spine: Heterogeneous decreased bone marrow signal similar to prior MRI of the head. Sinuses/Orbits: Negative. Other: None. IMPRESSION: 1. Subtle enhancement at the fundus of right internal auditory canal and porus acusticus of left internal auditory canal may represent leptomeningeal metastatic disease. No brain parenchymal metastasis identified. Consider dedicated internal auditory canal MRI with and without contrast for confirmation. 2. No additional acute intracranial abnormality identified. 3. Stable heterogeneity of bone marrow which may represent sequelae of metastatic disease or chemotherapy. These results will be called to the ordering clinician or representative by the Radiologist Assistant, and communication documented in the PACS or zVision Dashboard. Electronically Signed   By: Kristine Garbe M.D.   On: 03/05/2018 18:59   Ir Nephrostomy Exchange Left  Result Date: 02/13/2018 INDICATION: History of breast carcinoma with prior bilateral percutaneous nephrostomy tube placement for ureteral obstruction and hydronephrosis. EXAM: BILATERAL PERCUTANEOUS NEPHROSTOMY TUBE EXCHANGE COMPARISON:  None. MEDICATIONS: None ANESTHESIA/SEDATION:  None CONTRAST:  20 mL Isovue-300-administered into the collecting system(s) FLUOROSCOPY TIME:  Fluoroscopy Time: 1 minutes and 6 seconds. COMPLICATIONS: None immediate. PROCEDURE: Informed written consent was obtained from the patient after a thorough discussion of the procedural risks, benefits and alternatives. All questions were addressed. Maximal Sterile Barrier Technique was utilized including caps, mask, sterile gowns, sterile gloves, sterile drape, hand hygiene and skin antiseptic. A timeout was performed prior to the initiation of the procedure. The left 8.5 French nephrostomy tube was injected with contrast material. The catheter was cut and removed over a guidewire. A new nephrostomy tube was advanced over the wire. This tube was injected with contrast material and ureteral patency assessed to the level of the bladder. The tube was flushed and connected to a new gravity drainage bag. The right 10 French nephrostomy tube was injected with contrast material. The catheter was cut and removed over a guidewire. A new nephrostomy tube was advanced over the wire. This tube was injected with contrast material and ureteral patency assessed to the level of the bladder. The tube was flushed and connected to a new gravity drainage bag. Both tubes were secured at the skin with Prolene retention sutures. FINDINGS: Both nephrostomy tubes were exchanged and formed at the level of the renal pelvis bilaterally. Nephrostogram spine laterally now demonstrate patent ureters to the level of the bladder without evidence of obstruction. Pending on course of current cancer treatment and renal function, a trial of capping of the nephrostomy tubes could be considered bilaterally prior to potential removal. IMPRESSION: Bilateral nephrostomy tube exchange, as above. Both tubes were reconnected to gravity drainage bags today. Nephrostograms now demonstrate patent ureters to the level of the bladder bilaterally without evidence of  obstruction. Depending on current cancer treatment plan and renal function, a trial of capping of the nephrostomy tubes can be considered bilaterally prior to potential removal. Electronically Signed   By: Aletta Edouard M.D.   On: 02/13/2018 10:10   Ir Nephrostomy Exchange Right  Result Date: 02/13/2018 INDICATION: History of breast carcinoma with prior bilateral percutaneous nephrostomy tube placement for ureteral obstruction and hydronephrosis. EXAM: BILATERAL PERCUTANEOUS NEPHROSTOMY TUBE EXCHANGE COMPARISON:  None. MEDICATIONS: None ANESTHESIA/SEDATION: None CONTRAST:  20 mL Isovue-300-administered into the collecting system(s) FLUOROSCOPY TIME:  Fluoroscopy Time: 1 minutes and 6 seconds. COMPLICATIONS: None immediate. PROCEDURE: Informed written consent was obtained from the patient  after a thorough discussion of the procedural risks, benefits and alternatives. All questions were addressed. Maximal Sterile Barrier Technique was utilized including caps, mask, sterile gowns, sterile gloves, sterile drape, hand hygiene and skin antiseptic. A timeout was performed prior to the initiation of the procedure. The left 8.5 French nephrostomy tube was injected with contrast material. The catheter was cut and removed over a guidewire. A new nephrostomy tube was advanced over the wire. This tube was injected with contrast material and ureteral patency assessed to the level of the bladder. The tube was flushed and connected to a new gravity drainage bag. The right 10 French nephrostomy tube was injected with contrast material. The catheter was cut and removed over a guidewire. A new nephrostomy tube was advanced over the wire. This tube was injected with contrast material and ureteral patency assessed to the level of the bladder. The tube was flushed and connected to a new gravity drainage bag. Both tubes were secured at the skin with Prolene retention sutures. FINDINGS: Both nephrostomy tubes were exchanged and  formed at the level of the renal pelvis bilaterally. Nephrostogram spine laterally now demonstrate patent ureters to the level of the bladder without evidence of obstruction. Pending on course of current cancer treatment and renal function, a trial of capping of the nephrostomy tubes could be considered bilaterally prior to potential removal. IMPRESSION: Bilateral nephrostomy tube exchange, as above. Both tubes were reconnected to gravity drainage bags today. Nephrostograms now demonstrate patent ureters to the level of the bladder bilaterally without evidence of obstruction. Depending on current cancer treatment plan and renal function, a trial of capping of the nephrostomy tubes can be considered bilaterally prior to potential removal. Electronically Signed   By: Aletta Edouard M.D.   On: 02/13/2018 10:10   Dg Fluoro Guided Loc Of Needle/cath Tip For Spinal Inject Lt  Result Date: 03/01/2018 CLINICAL DATA:  Blurry vision, vertigo EXAM: DIAGNOSTIC LUMBAR PUNCTURE UNDER FLUOROSCOPIC GUIDANCE FLUOROSCOPY TIME:  Fluoroscopy Time:  0.1 minute Radiation Exposure Index (if provided by the fluoroscopic device): 0.8 mGy Number of Acquired Spot Images: 0 PROCEDURE: Informed consent was obtained from the patient prior to the procedure, including potential complications of headache, allergy, and pain. With the patient prone, the lower back was prepped with Betadine. 1% Lidocaine was used for local anesthesia. Lumbar puncture was performed at the L5-S1 level using a 22 gauge needle with return of clear CSF. 10 ml of CSF were obtained for laboratory studies. The patient tolerated the procedure well and there were no apparent complications. IMPRESSION: Successful fluoroscopic guided lumbar puncture. Electronically Signed   By: Kathreen Devoid   On: 03/01/2018 12:59     Assessment and plan Patient is a 57 y.o. female with metastatic breast cancer, visceral crisis present for follow-up prior to chemotherapy treatment. 1.  Metastatic breast cancer ([place])   2. Anemia due to antineoplastic chemotherapy   3. Port-A-Cath in place   4. 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  MRI brain was independently reviewed by me.  Discussed with patient and husband about MRI findings and CSF cytology findings. Consistent with leptomeningeal involvement of metastatic breast cancer. The findings may contributing to her vertigo and vision problems. Prognosis is poorer with CNS involvements, discussed with patient and husband. Recommend intrathecal chemotherapy.  Need to refer to Neurosurgery Dr.Jaikumar at [facility] for for placement ommaya reservoir  # Proceed with Cycle 1 CDK 4/5 inhibitor with Ibrance [125mg  on [Day 1-21 every  28 days]. , in combination with Zoladex + Faslodex. She started [place] on 03/06/2018. Check labs on Day 14.  Abemaciclib has best CNS penetrance among CDK 4/5 inhibitors. Will look into insurance coverage to switch to Abemaciclib.   # History of DVT, continue Eliquis 5mg  BID.  # Medi port to be flushed every 6-8 weeks.   We spent sufficient time to discuss many aspect of care, questions were answered to patient's satisfaction. Total face to face encounter time for this patient visit was 25 min. >50% of the time was  spent in counseling and coordination of care.    Earlie Server, MD, PhD Hematology Oncology [facility] at [facility] Pager- [phone] 03/08/2018

## 2018-03-08 NOTE — Telephone Encounter (Signed)
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 There is more evidence with abemaciclib and CNS penetration. Study [id number], Abstract P1-19-01 presented at the [facility], evaluated the use of abemaciclib in patients with leptomeningeal metastases and found that patients responded to abemaciclib therapy.  CBC/CMP from 03/08/18 assessed, no relevant lab abnormalities. Prescription dose and frequency assessed.   Current medication list in Epic reviewed, no DDIs with abemaciclib identified.  Prescription has been e-scribed to the [facility] for benefits analysis and approval.  Oral Oncology Clinic will continue to follow for insurance authorization, copayment issues, initial counseling and start date.  Darl Pikes, PharmD, BCPS, [facility] Hematology/Oncology Clinical Pharmacist [facility]/AP Oral [facility] [phone]  03/08/2018 3:22 PM

## 2018-03-09 ENCOUNTER — Telehealth: Payer: Self-pay | Admitting: *Deleted

## 2018-03-09 ENCOUNTER — Ambulatory Visit: Payer: 59

## 2018-03-09 NOTE — Telephone Encounter (Signed)
Hband called to report that patient is dizzy and cannot move her left leg, asking if he needs to be conerned about his or if it is form the LP last week. Discussed with Dr Tasia Catchings who asked that he take her to the ER.  I called John back and advised doctor recommendation to go to ER and he states that the left leg is like it is asleep and is coming and going. I advised that he still take her to be evaluated at the ER. He stated "alright"

## 2018-03-09 NOTE — Telephone Encounter (Signed)
Oral Oncology Patient Advocate Encounter  Prior Authorization for Melynda Keller has been approved.  Maximum of 12 fills per insurance.  PA# [id number] Effective dates: 03/08/18 through 03/08/19.   Patients co-pay is $0.00.  Can obtain copay card if high copay in January.  Oral Oncology Clinic will continue to follow.   [place] [facility] [place] Phone [phone] Fax [phone] 03/09/2018 8:52 AM

## 2018-03-09 NOTE — Telephone Encounter (Signed)
Calling patient in January after she finishes cycle of [place].

## 2018-03-19 ENCOUNTER — Inpatient Hospital Stay: Payer: 59

## 2018-03-19 ENCOUNTER — Encounter: Payer: Self-pay | Admitting: Oncology

## 2018-03-19 ENCOUNTER — Other Ambulatory Visit: Payer: Self-pay

## 2018-03-19 ENCOUNTER — Inpatient Hospital Stay (HOSPITAL_BASED_OUTPATIENT_CLINIC_OR_DEPARTMENT_OTHER): Payer: 59 | Admitting: Oncology

## 2018-03-19 VITALS — BP 148/84 | HR 73 | Temp 96.8°F | Resp 18 | Wt 141.2 lb

## 2018-03-19 DIAGNOSIS — Z9221 Personal history of antineoplastic chemotherapy: Secondary | ICD-10-CM | POA: Diagnosis not present

## 2018-03-19 DIAGNOSIS — C50412 Malignant neoplasm of upper-outer quadrant of left female breast: Secondary | ICD-10-CM

## 2018-03-19 DIAGNOSIS — C7949 Secondary malignant neoplasm of other parts of nervous system: Secondary | ICD-10-CM

## 2018-03-19 DIAGNOSIS — Z923 Personal history of irradiation: Secondary | ICD-10-CM | POA: Diagnosis not present

## 2018-03-19 DIAGNOSIS — Z79818 Long term (current) use of other agents affecting estrogen receptors and estrogen levels: Secondary | ICD-10-CM | POA: Diagnosis not present

## 2018-03-19 DIAGNOSIS — C50411 Malignant neoplasm of upper-outer quadrant of right female breast: Secondary | ICD-10-CM | POA: Diagnosis not present

## 2018-03-19 DIAGNOSIS — Z170 Estrogen receptor positive status [ER+]: Secondary | ICD-10-CM | POA: Diagnosis not present

## 2018-03-19 DIAGNOSIS — C50919 Malignant neoplasm of unspecified site of unspecified female breast: Secondary | ICD-10-CM

## 2018-03-19 DIAGNOSIS — N136 Pyonephrosis: Secondary | ICD-10-CM | POA: Diagnosis not present

## 2018-03-19 DIAGNOSIS — Z9013 Acquired absence of bilateral breasts and nipples: Secondary | ICD-10-CM

## 2018-03-19 DIAGNOSIS — T451X5S Adverse effect of antineoplastic and immunosuppressive drugs, sequela: Secondary | ICD-10-CM

## 2018-03-19 DIAGNOSIS — Z803 Family history of malignant neoplasm of breast: Secondary | ICD-10-CM

## 2018-03-19 DIAGNOSIS — Z8041 Family history of malignant neoplasm of ovary: Secondary | ICD-10-CM

## 2018-03-19 DIAGNOSIS — R51 Headache: Secondary | ICD-10-CM

## 2018-03-19 DIAGNOSIS — K219 Gastro-esophageal reflux disease without esophagitis: Secondary | ICD-10-CM

## 2018-03-19 DIAGNOSIS — Z7901 Long term (current) use of anticoagulants: Secondary | ICD-10-CM

## 2018-03-19 DIAGNOSIS — D6481 Anemia due to antineoplastic chemotherapy: Secondary | ICD-10-CM

## 2018-03-19 DIAGNOSIS — F419 Anxiety disorder, unspecified: Secondary | ICD-10-CM

## 2018-03-19 DIAGNOSIS — T451X5A Adverse effect of antineoplastic and immunosuppressive drugs, initial encounter: Secondary | ICD-10-CM

## 2018-03-19 DIAGNOSIS — G96198 Other disorders of meninges, not elsewhere classified: Secondary | ICD-10-CM

## 2018-03-19 DIAGNOSIS — Z79899 Other long term (current) drug therapy: Secondary | ICD-10-CM

## 2018-03-19 DIAGNOSIS — Z87891 Personal history of nicotine dependence: Secondary | ICD-10-CM

## 2018-03-19 DIAGNOSIS — Z936 Other artificial openings of urinary tract status: Secondary | ICD-10-CM

## 2018-03-19 DIAGNOSIS — R112 Nausea with vomiting, unspecified: Secondary | ICD-10-CM

## 2018-03-19 DIAGNOSIS — G039 Meningitis, unspecified: Secondary | ICD-10-CM

## 2018-03-19 DIAGNOSIS — I10 Essential (primary) hypertension: Secondary | ICD-10-CM

## 2018-03-19 DIAGNOSIS — Z86718 Personal history of other venous thrombosis and embolism: Secondary | ICD-10-CM

## 2018-03-19 DIAGNOSIS — R519 Headache, unspecified: Secondary | ICD-10-CM

## 2018-03-19 DIAGNOSIS — Z95828 Presence of other vascular implants and grafts: Secondary | ICD-10-CM

## 2018-03-19 DIAGNOSIS — C7911 Secondary malignant neoplasm of bladder: Secondary | ICD-10-CM | POA: Diagnosis not present

## 2018-03-19 LAB — COMPREHENSIVE METABOLIC PANEL
ALT: 19 U/L (ref 0–44)
AST: 28 U/L (ref 15–41)
Albumin: 4 g/dL (ref 3.5–5.0)
Alkaline Phosphatase: 93 U/L (ref 38–126)
Anion gap: 11 (ref 5–15)
BUN: 20 mg/dL (ref 6–20)
CO2: 31 mmol/L (ref 22–32)
Calcium: 9.4 mg/dL (ref 8.9–10.3)
Chloride: 100 mmol/L (ref 98–111)
Creatinine, Ser: 0.92 mg/dL (ref 0.44–1.00)
GFR calc Af Amer: >60 mL/min (ref >60)
GFR calc non Af Amer: >60 mL/min (ref >60)
Glucose, Bld: 161 mg/dL — ABNORMAL HIGH (ref 70–99)
Potassium: 4.2 mmol/L (ref 3.5–5.1)
Sodium: 142 mmol/L (ref 135–145)
Total Bilirubin: 0.5 mg/dL (ref 0.3–1.2)
Total Protein: 7.7 g/dL (ref 6.5–8.1)

## 2018-03-19 LAB — CBC WITH DIFFERENTIAL/PLATELET
Abs Immature Granulocytes: 0.08 10*3/uL — ABNORMAL HIGH (ref 0.00–0.07)
Basophils Absolute: 0 10*3/uL (ref 0.0–0.1)
Basophils Relative: 0 %
Eosinophils Absolute: 0 10*3/uL (ref 0.0–0.5)
Eosinophils Relative: 0 %
HEMATOCRIT: 37 % (ref 36.0–46.0)
Hemoglobin: 11.8 g/dL — ABNORMAL LOW (ref 12.0–15.0)
Immature Granulocytes: 2 %
Lymphocytes Relative: 12 %
Lymphs Abs: 0.4 10*3/uL — ABNORMAL LOW (ref 0.7–4.0)
MCH: 29.4 pg (ref 26.0–34.0)
MCHC: 31.9 g/dL (ref 30.0–36.0)
MCV: 92.3 fL (ref 80.0–100.0)
Monocytes Absolute: 0.1 10*3/uL (ref 0.1–1.0)
Monocytes Relative: 2 %
NEUTROS PCT: 84 %
Neutro Abs: 3.2 10*3/uL (ref 1.7–7.7)
Platelets: 195 10*3/uL (ref 150–400)
RBC: 4.01 MIL/uL (ref 3.87–5.11)
RDW: 16.4 % — ABNORMAL HIGH (ref 11.5–15.5)
WBC: 3.8 10*3/uL — ABNORMAL LOW (ref 4.0–10.5)
nRBC: 0 % (ref 0.0–0.2)

## 2018-03-19 MED ORDER — FULVESTRANT 250 MG/5ML IM SOLN
500.0000 mg | Freq: Once | INTRAMUSCULAR | Status: AC
  Administered 2018-03-19: 500 mg via INTRAMUSCULAR
  Filled 2018-03-19: qty 10

## 2018-03-19 MED ORDER — OXYCODONE HCL ER 10 MG PO T12A: 10.0000 mg | EXTENDED_RELEASE_TABLET | Freq: Two times a day (BID) | ORAL | 0 refills | Status: DC

## 2018-03-19 MED ORDER — GOSERELIN ACETATE 3.6 MG ~~LOC~~ IMPL
3.6000 mg | DRUG_IMPLANT | Freq: Once | SUBCUTANEOUS | Status: AC
  Administered 2018-03-19: 3.6 mg via SUBCUTANEOUS
  Filled 2018-03-19: qty 3.6

## 2018-03-19 MED ORDER — SCOPOLAMINE 1 MG/3DAYS TD PT72: 1.0000 | MEDICATED_PATCH | TRANSDERMAL | 0 refills | Status: DC

## 2018-03-19 MED ORDER — ONDANSETRON HCL 4 MG PO TABS: 4.0000 mg | ORAL_TABLET | Freq: Four times a day (QID) | ORAL | 1 refills | Status: DC | PRN

## 2018-03-19 MED ORDER — OXYCODONE HCL 5 MG PO TABS: 5.0000 mg | ORAL_TABLET | Freq: Four times a day (QID) | ORAL | 0 refills | Status: DC | PRN

## 2018-03-19 NOTE — Progress Notes (Signed)
Hematology/Oncology Follow Up Note [facility]  Telephone:([phone] [phone]  Patient Care Team: [name], MD as PCP - General (Internal Medicine)   Name of the patient: Mary [name]  [id number]  09-Sep-1960   REASON FOR VISIT  follow-up for management of metastatic breast cancer.  Pertinent oncology history # She was diagnosed with right upper quadrant breast in August 2013, status post neoadjuvant chemotherapy with FEC x4 followed by weekly Taxol x2, underwent bilateral mastectomy [February 2014], right ALND, and left sentinel LN biopsy.  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  Tis N0.  Patient also received adjuvant radiation.   Patient was started initially on tamoxifen plus ovarian suppression with Zoladex in June 2014,  and changed to Aromasin plus Zoladex monthly since June 2015.follows up with Dr.Gudina Loleta Dicker at [facility] in [place]. reports that she twisted and sprained her left ankle when getting out of her car on 12/11/2017.  Ankle has been swollen.  #Patient presented to emergency room due to abnormal lab values.  Calcium level was 13.9 with creatinine 1.63.  Also endorses right lower quadrant/right flank/right lower back pain for the past couple of days.  Also having had episodes of dizziness which were diagnosed as vertigo since April 2019.  She had a negative MRI brain done at that time which were essentially negative for acute findings.  In the emergency room CT renal stone study were independently reviewed by me which showed bilateral hydronephrosis, due to the stricture at the level of UPJ's.  Masslike thickening of the anterior and the lateral bladder wall suspicious for metastatic implant.  Mesenteric, omental, peritoneal implants with small ascites and diffuse mesenteric edema.  Small to moderate bilateral pleural  effusion with associated compressive atelectasis of the lung.  Diffuse small osseous skeletal but stated disease.  Patient was seen and evaluated by urology Dr. Erlene Quan.  She is status post bilateral ureter stent placement and infiltrative bladder mass biopsy. Unfortunately her creatinine increased further during the admission and eventually she got bilateral percutaneous nephrostomy tube placed.  Creatinine gradually gets better after hydration. For her hypercalcemia, patient received 1 dose of calcitonin and 1 dose of Zometa.  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 She has visceral crisis with metastatic breast cancer.  In order to avoid further delay outpatient, she was started on first dose of Taxol 80 mg/m on 12/22/2017 after detailed discussion of chemotherapy side effects and complications.  #Left lower extremity DVT, she was anticoagulated with heparin drip.  Switched to Eliquis 5 mg twice daily outpatient. # Patient previously follows up at [facility] center.  She prefers to follow-up with [place] cancer center as it is closer to her home.  # # Seen [facility] for blurry vision, lab work up in process, pending plan to start ocular steroid injections.   # lumbar puncture was done and CSF fluid study showed negative culture, low glucose, elevated protein, elevated wbc.  Cytology is positive for malignancy, consistent with breast origin.  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  INTERVAL HISTORY 57 year old female with metastatic breast lobular carcinoma presents for management of metastatic breast cancer   #  Today is day 14 of first cycle of  Ibrance.  Reports feeling nausea after taking Ibrance.  Also started to have headache radiated to his neck not relieved by taking tramadol.  Asking to have stronger pain regimen.  Takes dexamethasone once a day for nausea. Continue to have generalized bone pain as well which started after started on Faslodex and Zoladex.  #Vertigo, continues to use scopolamine patches with symptom relief.  Request refill. Denies any flank pain, fever or chills.  Good urine production.  Review of Systems  Constitutional: Positive for malaise/fatigue. Negative for chills, fever and weight loss.  HENT: Negative for nosebleeds and sore throat.   Eyes: Negative for double vision, photophobia and redness.       Blurry vision  Respiratory: Negative for cough, shortness of breath and wheezing.   Cardiovascular: Negative for chest pain, palpitations, orthopnea and leg swelling.  Gastrointestinal: Positive for nausea. Negative for abdominal pain, blood in stool and vomiting.  Genitourinary: Negative for dysuria, flank pain, frequency and urgency.  Musculoskeletal: Negative for back pain, myalgias and neck pain.  Skin: Negative for itching and rash.  Neurological: Positive for headaches. Negative for dizziness, tingling and tremors.  Endo/Heme/Allergies: Negative for environmental allergies. Does not bruise/bleed easily.  Psychiatric/Behavioral: Negative for depression and hallucinations.      Allergies  Allergen Reactions  . Adhesive [Tape] Other (See Comments)    Redness:Please use paper tape     Past Medical History:  Diagnosis Date  . Anxiety    takes Xanax prn  . Breast cancer [facility]) August 2013   bilateral, er/pr+, bladder (02/2018..mets from breast ca)  . Chronic kidney disease 01/2018   recent shut down of kidneys with labs out of whack  . Complication of anesthesia    pt states she had the shakes extremely bad  . Cough    at night d/t reflux;nothing productive  . Dental crowns present    x 4    . Depression   . GERD (gastroesophageal reflux disease)    takes Nexium daily  . History of bronchitis    08/2011  . History of colon polyps   . Hot flashes, menopausal 08/08/2012  . IBS (irritable bowel syndrome)    immodium prn  . Insomnia    takes Ambien nightly  . Irritable bowel syndrome   . Neutropenia, drug-induced ([place]) 12/15/2011  . Nocturia   . Plantar fasciitis   . S/P radiation therapy 07/03/2012 through 08/18/2038                                                        Right chest wall/regional lymph nodes 5040 cGy 28 sessions, right chest wall/mastectomy scar boost 1000 cGy 5 sessions                          . Status post chemotherapy    FEC 100 starting on 12/08/2011 - 01/18/12/single agent Taxol x12 weeks from 02/02/2012 through January 2014   . Urinary frequency   . Use of tamoxifen (Nolvadex)    now stopped. will be starting ibrance on 03/05/18  . Vertigo      Past Surgical History:  Procedure Laterality Date  . BREAST LUMPECTOMY  2008   left  . COLONOSCOPY    . COLONOSCOPY WITH PROPOFOL  N/A 10/10/2017   Procedure: COLONOSCOPY WITH PROPOFOL;  Surgeon: Lucilla Lame, MD;  Location: [facility] ENDOSCOPY;  Service: Endoscopy;  Laterality: N/A;  . CYSTOSCOPY WITH STENT PLACEMENT Bilateral 12/17/2017   Procedure: CYSTOSCOPY WITH STENT PLACEMENT;  Surgeon: Hollice Espy, MD;  Location: [facility] ORS;  Service: Urology;  Laterality: Bilateral;  . IR NEPHROSTOMY EXCHANGE LEFT  02/13/2018  . IR NEPHROSTOMY EXCHANGE RIGHT  01/02/2018  . IR NEPHROSTOMY EXCHANGE RIGHT  02/13/2018  . IR NEPHROSTOMY PLACEMENT LEFT  12/19/2017  . IR NEPHROSTOMY PLACEMENT RIGHT  12/19/2017  . LUMBAR PUNCTURE  03/01/2018  . MASTECTOMY Bilateral 2013  . MASTECTOMY WITH AXILLARY LYMPH NODE DISSECTION Right 05/18/2012   Procedure: MASTECTOMY WITH AXILLARY LYMPH NODE DISSECTION;  Surgeon: Adin Hector, MD;  Location: [place];  Service: General;  Laterality: Right;  Bilateral Total Mastectomy , right  axillary lymph node dissection left axillary sentinel node biopsy, removal of Porta Cath  . NEPHROSTOMY TUBE REMOVAL Bilateral 03/01/2018   removed tubes in dr. Erlene Quan office  . PORT-A-CATH REMOVAL N/A 05/18/2012   Procedure: REMOVAL PORT-A-CATH;  Surgeon: Adin Hector, MD;  Location: [place];  Service: General;  Laterality: N/A;  . PORTA CATH INSERTION N/A 12/26/2017   Procedure: PORTA CATH INSERTION;  Surgeon: Katha Cabal, MD;  Location: [place] [facility];  Service: Cardiovascular;  Laterality: N/A;  . PORTACATH PLACEMENT  12/02/2011   Procedure: INSERTION PORT-A-CATH;  Surgeon: Adin Hector, MD;  Location: [place];  Service: General;  Laterality: N/A;  insertion port a cath with fluoroscopy  . SIMPLE MASTECTOMY WITH AXILLARY SENTINEL NODE BIOPSY Left 05/18/2012   Procedure: SIMPLE MASTECTOMY WITH AXILLARY SENTINEL NODE BIOPSY;  Surgeon: Adin Hector, MD;  Location: [place];  Service: General;  Laterality: Left;    Social History   Socioeconomic History  . Marital status: Married    Spouse name: Jenny Reichmann  . Number of children: 0  . Years of education: Not on file  . Highest education level: Not on file  Occupational History  . Occupation: Network engineer II    Employer:     Comment: on fmla d/t seizure in 10/19  Social Needs  . Financial resource strain: Not on file  . Food insecurity:    Worry: Not on file    Inability: Not on file  . Transportation needs:    Medical: Not on file    Non-medical: Not on file  Tobacco Use  . Smoking status: Former Smoker    Packs/day: 0.50    Years: 15.00    Pack years: 7.50    Types: Cigarettes    Last attempt to quit: 03/21/1997    Years since quitting: 21.0  . Smokeless tobacco: Never Used  Substance and Sexual Activity  . Alcohol use: Not Currently    Alcohol/week: 0.0 standard drinks    Comment: occasionally wine  . Drug use: No  . Sexual activity: Yes    Birth control/protection: None  Lifestyle   . Physical activity:    Days per week: Not on file    Minutes per session: Not on file  . Stress: Not on file  Relationships  . Social connections:    Talks on phone: Not on file    Gets together: Not on file    Attends religious service: Not on file    Active member of club or organization: Not on file    Attends meetings of clubs or organizations: Not on file    Relationship  status: Not on file  . Intimate partner violence:    Fear of current or ex partner: Not on file    Emotionally abused: Not on file    Physically abused: Not on file    Forced sexual activity: Not on file  Other Topics Concern  . Not on file  Social History Narrative   Married to [employer]  No children 54 years old with fmp.  20 year use of birth control pills.      Family History  Problem Relation Age of Onset  . Hypertension Mother   . Diabetes Mother   . Hypertension Maternal Grandmother   . Pancreatic cancer Paternal Grandmother        diagnosed in her 4s  . Ovarian cancer Maternal Aunt        maternal grandmother's sister  . Breast cancer Paternal Aunt        diagnosed in late 18s early 88s  . Parkinsonism Paternal Grandfather      Current Outpatient Medications:  .  amLODipine (NORVASC) 5 MG tablet, TAKE 1 TABLET BY MOUTH DAILY., Disp: 90 tablet, Rfl: 1 .  apixaban (ELIQUIS) 2.5 MG TABS tablet, Take 2.5 mg by mouth 2 (two) times daily., Disp: , Rfl:  .  Ascorbic Acid (VITAMIN C) 100 MG tablet, Take 100 mg by mouth daily.  , Disp: , Rfl:  .  Calcium Carbonate-Vitamin D (CALTRATE 600+D PO), Take 1 tablet by mouth daily., Disp: , Rfl:  .  dexamethasone (DECADRON) 4 MG tablet, Take 1 tablet (4 mg total) by mouth See admin instructions. You may take 4 mg daily for 2 days after chemotherapy if needed for nausea, Disp: 30 tablet, Rfl: 0 .  diazepam (VALIUM) 10 MG tablet, Take 1 tablet (10 mg total) by mouth at bedtime as needed for anxiety., Disp: 30 tablet, Rfl: 5 .  esomeprazole (NEXIUM) 40  MG capsule, Take 40 mg by mouth daily as needed. , Disp: , Rfl:  .  ferrous sulfate 325 (65 FE) MG EC tablet, Take 1 tablet (325 mg total) by mouth daily., Disp: 30 tablet, Rfl: 3 .  fulvestrant (FASLODEX) 250 MG/5ML injection, Inject 250 mg into the muscle every 14 (fourteen) days. One injection each buttock over 1-2 minutes. Warm prior to use., Disp: , Rfl:  .  furosemide (LASIX) 20 MG tablet, Take 1 tablet (20 mg total) by mouth daily as needed., Disp: 30 tablet, Rfl: 1 .  goserelin (ZOLADEX) 3.6 MG injection, Inject 3.6 mg into the skin every 28 (twenty-eight) days., Disp: , Rfl:  .  lidocaine-prilocaine (EMLA) cream, Apply to affected area once, Disp: 30 g, Rfl: 3 .  loperamide (IMODIUM A-D) 2 MG tablet, Take 2 mg by mouth 4 (four) times daily as needed for diarrhea or loose stools. , Disp: , Rfl:  .  Multiple Vitamin (MULTIVITAMIN) tablet, Take 1 tablet by mouth daily.  , Disp: , Rfl:  .  ondansetron (ZOFRAN) 4 MG tablet, Take 1 tablet (4 mg total) by mouth every 6 (six) hours as needed for nausea or vomiting., Disp: 60 tablet, Rfl: 1 .  oxybutynin (DITROPAN) 5 MG tablet, Take 1 tablet (5 mg total) by mouth every 8 (eight) hours as needed for bladder spasms., Disp: 20 tablet, Rfl: 0 .  palbociclib (IBRANCE) 125 MG capsule, Take 1 capsule (125 mg total) by mouth daily. Take whole with food. Take for 21 days on, 7 days off, repeat every 28 days., Disp: 21 capsule, Rfl: 2 .  potassium  chloride SA (K-DUR,KLOR-CON) 20 MEQ tablet, Take 1 tablet (20 mEq total) by mouth daily., Disp: 30 tablet, Rfl: 0 .  prochlorperazine (COMPAZINE) 10 MG tablet, Take 1 tablet (10 mg total) by mouth every 6 (six) hours as needed (Nausea or vomiting)., Disp: 30 tablet, Rfl: 1 .  scopolamine (TRANSDERM-SCOP) 1 MG/3DAYS, Place 1 patch (1.5 mg total) onto the skin every 3 (three) days., Disp: 10 patch, Rfl: 0 .  traZODone (DESYREL) 50 MG tablet, Take 0.5-1 tablets (25-50 mg total) by mouth at bedtime as needed for sleep.,  Disp: 90 tablet, Rfl: 1 .  oxyCODONE (OXY IR/ROXICODONE) 5 MG immediate release tablet, Take 1 tablet (5 mg total) by mouth every 6 (six) hours as needed for severe pain., Disp: 30 tablet, Rfl: 0 .  oxyCODONE (OXYCONTIN) 10 mg 12 hr tablet, Take 1 tablet (10 mg total) by mouth every 12 (twelve) hours., Disp: 28 tablet, Rfl: 0  Physical exam: ECOG 1  Vitals:   03/19/18 1419  BP: (!) 148/84  Pulse: 73  Resp: 18  Temp: (!) 96.8 F (36 C)  Weight: 141 lb 3.2 oz (64 kg)   Physical Exam Constitutional:      General: She is not in acute distress. HENT:     Head: Normocephalic and atraumatic.  Eyes:     General: No scleral icterus.    Pupils: Pupils are equal, round, and reactive to light.  Neck:     Musculoskeletal: Normal range of motion and neck supple.  Cardiovascular:     Rate and Rhythm: Normal rate and regular rhythm.     Heart sounds: Normal heart sounds.  Pulmonary:     Effort: Pulmonary effort is normal. No respiratory distress.     Breath sounds: No wheezing.  Abdominal:     General: Bowel sounds are normal. There is no distension.     Palpations: Abdomen is soft. There is no mass.     Tenderness: There is no abdominal tenderness.  Musculoskeletal: Normal range of motion.        General: No deformity.  Skin:    General: Skin is warm and dry.     Findings: No erythema or rash.  Neurological:     General: No focal deficit present.     Mental Status: She is alert and oriented to person, place, and time.     Cranial Nerves: No cranial nerve deficit.     Coordination: Coordination normal.  Psychiatric:        Behavior: Behavior normal.        Thought Content: Thought content normal.     CMP Latest Ref Rng & Units 03/19/2018  Glucose 70 - 99 mg/dL 161(H)  BUN 6 - 20 mg/dL 20  Creatinine 0.44 - 1.00 mg/dL 0.92  Sodium 135 - 145 mmol/L 142  Potassium 3.5 - 5.1 mmol/L 4.2  Chloride 98 - 111 mmol/L 100  CO2 22 - 32 mmol/L 31  Calcium 8.9 - 10.3 mg/dL 9.4  Total  Protein 6.5 - 8.1 g/dL 7.7  Total Bilirubin 0.3 - 1.2 mg/dL 0.5  Alkaline Phos 38 - 126 U/L 93  AST 15 - 41 U/L 28  ALT 0 - 44 U/L 19   CBC Latest Ref Rng & Units 03/19/2018  WBC 4.0 - 10.5 K/uL 3.8(L)  Hemoglobin 12.0 - 15.0 g/dL 11.8(L)  Hematocrit 36.0 - 46.0 % 37.0  Platelets 150 - 400 K/uL 195    Mr Brain W Wo Contrast  Result Date: 03/05/2018 CLINICAL DATA:  57 y/o F; recurrent breast cancer. Abnormal lumbar puncture 03/01/2018. EXAM: MRI HEAD WITHOUT AND WITH CONTRAST TECHNIQUE: Multiplanar, multiecho pulse sequences of the brain and surrounding structures were obtained without and with intravenous contrast. CONTRAST:  6.5 cc Gadavist. COMPARISON:  01/03/2018 MRI of the head. 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 No reduced diffusion to suggest acute or early subacute infarction. No new focus of susceptibility hypointensity to indicate interval intracranial hemorrhage. No mass effect, extra-axial collection, or hydrocephalus. Mild stable diffuse volume loss of the brain. After administration of intravenous contrast there is subtle enhancement in the fundus of right internal auditory canal and at the porus acusticus has of left internal auditory canal (series 16 image 46 and 48). Vascular: Normal flow voids. Skull and upper cervical spine: Heterogeneous decreased bone marrow signal similar to prior MRI of the head. Sinuses/Orbits: Negative. Other: None. IMPRESSION: 1. Subtle enhancement at the fundus of right internal auditory canal and porus acusticus of left internal auditory canal may represent leptomeningeal metastatic disease. No brain parenchymal metastasis identified. Consider dedicated internal auditory canal MRI with and without contrast for confirmation. 2. No additional acute intracranial abnormality identified. 3. Stable heterogeneity of bone marrow which may represent sequelae of metastatic disease or  chemotherapy. These results will be called to the ordering clinician or representative by the Radiologist Assistant, and communication documented in the PACS or zVision Dashboard. Electronically Signed   By: Kristine Garbe M.D.   On: 03/05/2018 18:59   Dg Fluoro Guided Loc Of Needle/cath Tip For Spinal Inject Lt  Result Date: 03/01/2018 CLINICAL DATA:  Blurry vision, vertigo EXAM: DIAGNOSTIC LUMBAR PUNCTURE UNDER FLUOROSCOPIC GUIDANCE FLUOROSCOPY TIME:  Fluoroscopy Time:  0.1 minute Radiation Exposure Index (if provided by the fluoroscopic device): 0.8 mGy Number of Acquired Spot Images: 0 PROCEDURE: Informed consent was obtained from the patient prior to the procedure, including potential complications of headache, allergy, and pain. With the patient prone, the lower back was prepped with Betadine. 1% Lidocaine was used for local anesthesia. Lumbar puncture was performed at the L5-S1 level using a 22 gauge needle with return of clear CSF. 10 ml of CSF were obtained for laboratory studies. The patient tolerated the procedure well and there were no apparent complications. IMPRESSION: Successful fluoroscopic guided lumbar puncture. Electronically Signed   By: Kathreen Devoid   On: 03/01/2018 12:59     Assessment and plan Patient is a 57 y.o. female with metastatic breast cancer, visceral crisis present for follow-up prior to chemotherapy treatment. 1. Metastatic breast cancer ([place])   2. Anemia due to antineoplastic chemotherapy   3. Leptomeningeal disease   4. Nonintractable headache, unspecified chronicity pattern, unspecified headache type   5. Port-A-Cath in place   6. 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  She has been contacted by [facility] neurosurgery Dr. Talbot Grumbling for placement of [place].  She will also have a CT head image [facility] done at [facility] prior to  consultation.  #Headache/nausea, questionable side effects from Ibrance versus worsening of neurologic symptoms.  Timing of onset after starting Ibrance.  Labs reviewed and discussed with patient.  Counts are stable.  No neutropenia.  Advised patient to stop taking Ibrance to see if the symptoms improved.  Advised not to use dexamethasone for nausea which can further exacerbate her headache.  Recommend using Zofran or Compazine for nausea. She will be off [place]  this week.  Plan to switch to abemaciclib next week, hoping to have better CNS coverage.  #Proceed with Zoladex and Faslodex today. #History of DVT, continue Eliquis 5 mg twice daily. #Mediport to be flush every 6 to 8 weeks.  Scheduled. Follow-up in 2 weeks for further evaluation. Total face to face encounter time for this patient visit was 25 min. >50% of the time was  spent in counseling and coordination of care.    Earlie Server, MD, PhD Hematology Oncology [facility] at [facility] Pager- [phone] 03/19/2018

## 2018-03-19 NOTE — Progress Notes (Signed)
Patient here for follow up. Complains of having headaches daily especially at night. Pain radiates to neck, back and arms. She also has been nauseated. Tramadol not helping with pain. Pt states she has gotten weaker. She also gets dizzy and shaky. Pt requesting refill on eliquis, scopalamine patch, and tramadol.

## 2018-03-20 ENCOUNTER — Telehealth: Payer: Self-pay | Admitting: Pharmacist

## 2018-03-20 DIAGNOSIS — C50919 Malignant neoplasm of unspecified site of unspecified female breast: Secondary | ICD-10-CM

## 2018-03-20 MED ORDER — ABEMACICLIB 150 MG PO TABS: 150.0000 mg | ORAL_TABLET | Freq: Two times a day (BID) | ORAL | 2 refills | Status: DC

## 2018-03-20 NOTE — Telephone Encounter (Signed)
Oral Chemotherapy Pharmacist Encounter  Verzenio for Mary [name] will be shipped today 03/20/18 with, 03/22/18 or 03/23/18 delivery. Dr. Tasia Catchings would like for the patient to start her medication on Monday 03/26/18. Ms. Tom husband Mary [name] stated his understanding of the plan.  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   Counseled Mary [name] on administration, dosing, side effects, monitoring, drug-food interactions, safe handling, storage, and disposal. Patient will take 1 tablet (150 mg total) by mouth 2 (two) times daily.  Side effects include but not limited to: diarrhea (instructed them to pick-up loperamide), N/V, decreased wbc/hgb/plt, fatigue.    Reviewed with patient importance of keeping a medication schedule and plan for any missed doses.  Mary [name] voiced understanding and appreciation. All questions answered. Medication handout placed in the mail.  Provided patient with [facility] phone number. Patient knows to call the office with questions or concerns. [facility] will continue to follow.  Darl Pikes, PharmD, BCPS, [facility] Hematology/Oncology Clinical Pharmacist [facility]/AP [name] [phone]  03/20/2018 9:00 AM

## 2018-03-28 ENCOUNTER — Other Ambulatory Visit: Payer: Self-pay

## 2018-03-28 ENCOUNTER — Encounter: Payer: Self-pay | Admitting: Emergency Medicine

## 2018-03-28 ENCOUNTER — Emergency Department
Admission: EM | Admit: 2018-03-28 | Discharge: 2018-03-28 | Disposition: A | Discharge status: Home / Self Care | Payer: 59 | Attending: Emergency Medicine | Admitting: Emergency Medicine

## 2018-03-28 ENCOUNTER — Other Ambulatory Visit: Payer: Self-pay | Admitting: *Deleted

## 2018-03-28 DIAGNOSIS — Z9013 Acquired absence of bilateral breasts and nipples: Secondary | ICD-10-CM | POA: Diagnosis not present

## 2018-03-28 DIAGNOSIS — E860 Dehydration: Secondary | ICD-10-CM

## 2018-03-28 DIAGNOSIS — R531 Weakness: Secondary | ICD-10-CM | POA: Diagnosis present

## 2018-03-28 DIAGNOSIS — Z853 Personal history of malignant neoplasm of breast: Secondary | ICD-10-CM | POA: Insufficient documentation

## 2018-03-28 DIAGNOSIS — Z9221 Personal history of antineoplastic chemotherapy: Secondary | ICD-10-CM | POA: Insufficient documentation

## 2018-03-28 DIAGNOSIS — I129 Hypertensive chronic kidney disease with stage 1 through stage 4 chronic kidney disease, or unspecified chronic kidney disease: Secondary | ICD-10-CM | POA: Diagnosis not present

## 2018-03-28 DIAGNOSIS — N189 Chronic kidney disease, unspecified: Secondary | ICD-10-CM | POA: Insufficient documentation

## 2018-03-28 DIAGNOSIS — R69 Illness, unspecified: Secondary | ICD-10-CM | POA: Diagnosis not present

## 2018-03-28 DIAGNOSIS — R55 Syncope and collapse: Secondary | ICD-10-CM

## 2018-03-28 DIAGNOSIS — R7303 Prediabetes: Secondary | ICD-10-CM | POA: Insufficient documentation

## 2018-03-28 DIAGNOSIS — Z87891 Personal history of nicotine dependence: Secondary | ICD-10-CM | POA: Diagnosis not present

## 2018-03-28 DIAGNOSIS — R5381 Other malaise: Secondary | ICD-10-CM | POA: Diagnosis not present

## 2018-03-28 LAB — URINALYSIS, COMPLETE (UACMP) WITH MICROSCOPIC
Bacteria, UA: NONE SEEN
Bilirubin Urine: NEGATIVE
Glucose, UA: NEGATIVE mg/dL
Hgb urine dipstick: NEGATIVE
Ketones, ur: NEGATIVE mg/dL
Leukocytes, UA: NEGATIVE
Nitrite: NEGATIVE
Protein, ur: NEGATIVE mg/dL
Specific Gravity, Urine: 1.011 (ref 1.005–1.030)
pH: 6 (ref 5.0–8.0)

## 2018-03-28 LAB — CBC WITH DIFFERENTIAL/PLATELET
ABS IMMATURE GRANULOCYTES: 0.02 10*3/uL (ref 0.00–0.07)
Basophils Absolute: 0 10*3/uL (ref 0.0–0.1)
Basophils Relative: 1 %
Eosinophils Absolute: 0 10*3/uL (ref 0.0–0.5)
Eosinophils Relative: 0 %
HCT: 36.3 % (ref 36.0–46.0)
Hemoglobin: 12 g/dL (ref 12.0–15.0)
Immature Granulocytes: 0 %
LYMPHS PCT: 12 %
Lymphs Abs: 0.6 10*3/uL — ABNORMAL LOW (ref 0.7–4.0)
MCH: 30 pg (ref 26.0–34.0)
MCHC: 33.1 g/dL (ref 30.0–36.0)
MCV: 90.8 fL (ref 80.0–100.0)
Monocytes Absolute: 0.3 10*3/uL (ref 0.1–1.0)
Monocytes Relative: 7 %
NEUTROS ABS: 3.9 10*3/uL (ref 1.7–7.7)
Neutrophils Relative %: 80 %
Platelets: 141 10*3/uL — ABNORMAL LOW (ref 150–400)
RBC: 4 MIL/uL (ref 3.87–5.11)
RDW: 17.2 % — ABNORMAL HIGH (ref 11.5–15.5)
WBC: 4.8 10*3/uL (ref 4.0–10.5)
nRBC: 0 % (ref 0.0–0.2)

## 2018-03-28 LAB — TROPONIN I: Troponin I: <0.03 ng/mL (ref <0.03)

## 2018-03-28 LAB — COMPREHENSIVE METABOLIC PANEL
ALT: 26 U/L (ref 0–44)
AST: 30 U/L (ref 15–41)
Albumin: 3 g/dL — ABNORMAL LOW (ref 3.5–5.0)
Alkaline Phosphatase: 97 U/L (ref 38–126)
Anion gap: 9 (ref 5–15)
BILIRUBIN TOTAL: 0.4 mg/dL (ref 0.3–1.2)
BUN: 14 mg/dL (ref 6–20)
CO2: 27 mmol/L (ref 22–32)
Calcium: 8.6 mg/dL — ABNORMAL LOW (ref 8.9–10.3)
Chloride: 99 mmol/L (ref 98–111)
Creatinine, Ser: 1.15 mg/dL — ABNORMAL HIGH (ref 0.44–1.00)
GFR calc Af Amer: >60 mL/min (ref >60)
GFR calc non Af Amer: 53 mL/min — ABNORMAL LOW (ref >60)
GLUCOSE: 113 mg/dL — AB (ref 70–99)
Potassium: 3.4 mmol/L — ABNORMAL LOW (ref 3.5–5.1)
Sodium: 135 mmol/L (ref 135–145)
TOTAL PROTEIN: 6.3 g/dL — AB (ref 6.5–8.1)

## 2018-03-28 LAB — GLUCOSE, CAPILLARY: Glucose-Capillary: 114 mg/dL — ABNORMAL HIGH (ref 70–99)

## 2018-03-28 MED ORDER — ONDANSETRON 4 MG PO TBDP
8.0000 mg | ORAL_TABLET | Freq: Once | ORAL | Status: AC
  Administered 2018-03-28: 8 mg via ORAL
  Filled 2018-03-28: qty 2

## 2018-03-28 MED ORDER — SODIUM CHLORIDE 0.9 % IV SOLN
Freq: Once | INTRAVENOUS | Status: AC
  Administered 2018-03-28: 13:00:00 via INTRAVENOUS

## 2018-03-28 MED ORDER — SODIUM CHLORIDE 0.9 % IV SOLN
1000.0000 mL | Freq: Once | INTRAVENOUS | Status: AC
  Administered 2018-03-28: 1000 mL via INTRAVENOUS

## 2018-03-28 NOTE — ED Provider Notes (Signed)
[facility] Emergency Department Provider Note       Time seen: ----------------------------------------- 10:20 AM on 03/28/2018 -----------------------------------------   I have reviewed the triage vital signs and the nursing notes.  HISTORY   Chief Complaint Weakness and Altered Mental Status   HPI Mary [name] is a 58 y.o. female with a history of chronic kidney disease, cough, depression, GERD, IBS, insomnia, neutropenia who presents to the ED for a near syncopal or syncopal event.  Patient was walking to the restroom this morning when she became increasingly weak and had to sit down.  Caregiver reports she had a blank stare and would not respond when she was called.  She has no recollection of this episode.  She feels back to her normal self now.  She is on oral chemotherapy for breast cancer.  Past Medical History:  Diagnosis Date  . Anxiety    takes Xanax prn  . Breast cancer [facility]) August 2013   bilateral, er/pr+, bladder (02/2018..mets from breast ca)  . Chronic kidney disease 01/2018   recent shut down of kidneys with labs out of whack  . Complication of anesthesia    pt states she had the shakes extremely bad  . Cough    at night d/t reflux;nothing productive  . Dental crowns present    x 4  . Depression   . GERD (gastroesophageal reflux disease)    takes Nexium daily  . History of bronchitis    08/2011  . History of colon polyps   . Hot flashes, menopausal 08/08/2012  . IBS (irritable bowel syndrome)    immodium prn  . Insomnia    takes Ambien nightly  . Irritable bowel syndrome   . 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 Nocturia   . Plantar fasciitis   . S/P radiation therapy 07/03/2012 through 08/18/2038                                                        Right chest wall/regional lymph nodes 5040 cGy 28 sessions, right chest wall/mastectomy scar boost 1000 cGy 5 sessions                          . Status post  chemotherapy    FEC 100 starting on 12/08/2011 - 01/18/12/single agent Taxol x12 weeks from 02/02/2012 through January 2014   . Urinary frequency   . Use of tamoxifen (Nolvadex)    now stopped. will be starting ibrance on 03/05/18  . Vertigo     Patient Active Problem List   Diagnosis Date Noted  . 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 Goals of care, counseling/discussion 01/14/2018  . Edema of left lower extremity   . Metastatic breast cancer ([place])   . Hypercalcemia 12/16/2017  . Malignancy ([place])   . Acute deep vein thrombosis (DVT) of distal vein of left lower extremity ([place])   . Dizziness   . Bilateral posterior neck pain 11/24/2017  . Elevated liver enzymes 11/24/2017  . Tubular adenoma of colon 10/11/2017  . 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 Generalized anxiety disorder 09/19/2017  . Vertigo, labyrinthine, bilateral 08/27/2017  . Prediabetes 08/27/2017  . Encounter for screening colonoscopy 09/21/2014  . Other malaise and fatigue 12/14/2013  . Hot flashes, menopausal 08/08/2012  .  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 Primary cancer of upper outer quadrant of left female breast ([place]) 11/17/2011  . Hypertension 09/23/2011  . PLANTAR FASCIITIS, LEFT 12/01/2009  . UNEQUAL LEG LENGTH 12/01/2009    Past Surgical History:  Procedure Laterality Date  . BREAST LUMPECTOMY  2008   left  . COLONOSCOPY    . COLONOSCOPY WITH PROPOFOL N/A 10/10/2017   Procedure: COLONOSCOPY WITH PROPOFOL;  Surgeon: Lucilla Lame, MD;  Location: [facility] ENDOSCOPY;  Service: Endoscopy;  Laterality: N/A;  . CYSTOSCOPY WITH STENT PLACEMENT Bilateral 12/17/2017   Procedure: CYSTOSCOPY WITH STENT PLACEMENT;  Surgeon: Hollice Espy, MD;  Location: [facility] ORS;  Service: Urology;  Laterality: Bilateral;  . IR NEPHROSTOMY EXCHANGE LEFT  02/13/2018  . IR NEPHROSTOMY EXCHANGE RIGHT  01/02/2018  . IR NEPHROSTOMY EXCHANGE RIGHT  02/13/2018  . IR NEPHROSTOMY PLACEMENT LEFT  12/19/2017  . IR  NEPHROSTOMY PLACEMENT RIGHT  12/19/2017  . LUMBAR PUNCTURE  03/01/2018  . MASTECTOMY Bilateral 2013  . MASTECTOMY WITH AXILLARY LYMPH NODE DISSECTION Right 05/18/2012   Procedure: MASTECTOMY WITH AXILLARY LYMPH NODE DISSECTION;  Surgeon: Adin Hector, MD;  Location: [place];  Service: General;  Laterality: Right;  Bilateral Total Mastectomy , right axillary lymph node dissection left axillary sentinel node biopsy, removal of Porta Cath  . NEPHROSTOMY TUBE REMOVAL Bilateral 03/01/2018   removed tubes in dr. Erlene Quan office  . PORT-A-CATH REMOVAL N/A 05/18/2012   Procedure: REMOVAL PORT-A-CATH;  Surgeon: Adin Hector, MD;  Location: [place];  Service: General;  Laterality: N/A;  . PORTA CATH INSERTION N/A 12/26/2017   Procedure: PORTA CATH INSERTION;  Surgeon: Katha Cabal, MD;  Location: [place] CV LAB;  Service: Cardiovascular;  Laterality: N/A;  . PORTACATH PLACEMENT  12/02/2011   Procedure: INSERTION PORT-A-CATH;  Surgeon: Adin Hector, MD;  Location: [place];  Service: General;  Laterality: N/A;  insertion port a cath with fluoroscopy  . SIMPLE MASTECTOMY WITH AXILLARY SENTINEL NODE BIOPSY Left 05/18/2012   Procedure: SIMPLE MASTECTOMY WITH AXILLARY SENTINEL NODE BIOPSY;  Surgeon: Adin Hector, MD;  Location: [place];  Service: General;  Laterality: Left;    Allergies Adhesive [tape]  Social History Social History   Tobacco Use  . Smoking status: Former Smoker    Packs/day: 0.50    Years: 15.00    Pack years: 7.50    Types: Cigarettes    Last attempt to quit: 03/21/1997    Years since quitting: 21.0  . Smokeless tobacco: Never Used  Substance Use Topics  . Alcohol use: Not Currently    Alcohol/week: 0.0 standard drinks    Comment: occasionally wine  . Drug use: No   Review of Systems Constitutional: Negative for fever. Cardiovascular: Negative for chest pain. Respiratory: Negative for shortness of breath. Gastrointestinal: Negative for  abdominal pain, vomiting and diarrhea. Genitourinary: Negative for dysuria. Musculoskeletal: Negative for back pain. Skin: Negative for rash. Neurological: Negative for headaches, positive for weakness  All systems negative/normal/unremarkable except as stated in the HPI  ____________________________________________   PHYSICAL EXAM:  VITAL SIGNS: ED Triage Vitals  Enc Vitals Group     BP      Pulse      Resp      Temp      Temp src      SpO2      Weight      Height      Head Circumference      Peak Flow  Pain Score      Pain Loc      Pain Edu?      Excl. in [place]?    Constitutional: Alert and oriented. Well appearing and in no distress. Eyes: Conjunctivae are normal. Normal extraocular movements. ENT      Head: Normocephalic and atraumatic.      Nose: No congestion/rhinnorhea.      Mouth/Throat: Mucous membranes are moist.      Neck: No stridor. Cardiovascular: Normal rate, regular rhythm. No murmurs, rubs, or gallops. Respiratory: Normal respiratory effort without tachypnea nor retractions. Breath sounds are clear and equal bilaterally. No wheezes/rales/rhonchi. Gastrointestinal: Soft and nontender. Normal bowel sounds Musculoskeletal: Nontender with normal range of motion in extremities. No lower extremity tenderness nor edema. Neurologic:  Normal speech and language. No gross focal neurologic deficits are appreciated.  Skin:  Skin is warm, dry and intact. No rash noted. Psychiatric: Mood and affect are normal. Speech and behavior are normal.  ____________________________________________  EKG: Interpreted by me.  Sinus rhythm the rate 84 bpm, normal PR interval, normal QRS, normal QT  ____________________________________________  ED COURSE:  As part of my medical decision making, I reviewed the following data within the [place] History obtained from family if available, nursing notes, old chart and ekg, as well as notes from prior ED  visits. Patient presented for syncope, we will assess with labs and imaging as indicated at this time.   Procedures ____________________________________________   LABS (pertinent positives/negatives)  Labs Reviewed  CBC WITH DIFFERENTIAL/PLATELET - Abnormal; Notable for the following components:      Result Value   RDW 17.2 (*)    Platelets 141 (*)    Lymphs Abs 0.6 (*)    All other components within normal limits  URINALYSIS, COMPLETE (UACMP) WITH MICROSCOPIC - Abnormal; Notable for the following components:   Color, Urine YELLOW (*)    APPearance CLEAR (*)    All other components within normal limits  GLUCOSE, CAPILLARY - Abnormal; Notable for the following components:   Glucose-Capillary 114 (*)    All other components within normal limits  COMPREHENSIVE METABOLIC PANEL - Abnormal; Notable for the following components:   Potassium 3.4 (*)    Glucose, Bld 113 (*)    Creatinine, Ser 1.15 (*)    Calcium 8.6 (*)    Total Protein 6.3 (*)    Albumin 3.0 (*)    GFR calc non Af Amer 53 (*)    All other components within normal limits  TROPONIN I  CBG MONITORING, ED  ____________________________________________   DIFFERENTIAL DIAGNOSIS   Dehydration, electrolyte abnormality, anemia, occult infection  FINAL ASSESSMENT AND PLAN  Syncope, dehydration   Plan: The patient had presented for syncope. Patient's labs likely indicate dehydration.  She was given 2 L of fluid here and I have not found any other acute abnormalities.  This is likely chemotherapy related.  Overall she appears cleared for outpatient follow-up.   Laurence Aly, MD    Note: This note was generated in part or whole with voice recognition software. Voice recognition is usually quite accurate but there are transcription errors that can and very often do occur. I apologize for any typographical errors that were not detected and corrected.     Earleen Newport, MD 03/28/18 [phone]

## 2018-03-28 NOTE — Telephone Encounter (Signed)
EMT Andres Labrum called needing to speak with Dr Tasia Catchings regarding patient for advice on what to do for her. Dr Tasia Catchings spoke with her and advised she be taken to ED for evaluation

## 2018-03-28 NOTE — ED Notes (Signed)
Patient given water and crackers to drink per MD. Sister at bedside with patient.

## 2018-03-28 NOTE — ED Triage Notes (Signed)
Patient from home via [facility]. Per EMS, patient was walking to restroom this morning when she has some increased weakness and had to sit down. Patient's caregiver reports patient had a "glazed look" and would not respond when she was calling her name. Patient has no recollection of episode. Patient alert and oriented upon arrival. Currently taking oral chemo for breast cancer.

## 2018-03-30 ENCOUNTER — Ambulatory Visit: Payer: 59

## 2018-04-02 ENCOUNTER — Inpatient Hospital Stay (HOSPITAL_BASED_OUTPATIENT_CLINIC_OR_DEPARTMENT_OTHER): Payer: 59 | Admitting: Oncology

## 2018-04-02 ENCOUNTER — Inpatient Hospital Stay: Payer: 59 | Attending: Oncology

## 2018-04-02 ENCOUNTER — Encounter: Payer: Self-pay | Admitting: Oncology

## 2018-04-02 ENCOUNTER — Other Ambulatory Visit: Payer: Self-pay

## 2018-04-02 ENCOUNTER — Inpatient Hospital Stay: Payer: 59

## 2018-04-02 VITALS — BP 134/94 | HR 101 | Temp 97.1°F | Resp 18

## 2018-04-02 VITALS — BP 151/83 | HR 90 | Temp 97.6°F | Resp 18

## 2018-04-02 DIAGNOSIS — E860 Dehydration: Secondary | ICD-10-CM | POA: Insufficient documentation

## 2018-04-02 DIAGNOSIS — N179 Acute kidney failure, unspecified: Secondary | ICD-10-CM | POA: Diagnosis not present

## 2018-04-02 DIAGNOSIS — C50412 Malignant neoplasm of upper-outer quadrant of left female breast: Secondary | ICD-10-CM

## 2018-04-02 DIAGNOSIS — Z7901 Long term (current) use of anticoagulants: Secondary | ICD-10-CM | POA: Diagnosis not present

## 2018-04-02 DIAGNOSIS — Z5111 Encounter for antineoplastic chemotherapy: Secondary | ICD-10-CM | POA: Insufficient documentation

## 2018-04-02 DIAGNOSIS — R112 Nausea with vomiting, unspecified: Secondary | ICD-10-CM

## 2018-04-02 DIAGNOSIS — Z95828 Presence of other vascular implants and grafts: Secondary | ICD-10-CM

## 2018-04-02 DIAGNOSIS — Z923 Personal history of irradiation: Secondary | ICD-10-CM

## 2018-04-02 DIAGNOSIS — R531 Weakness: Secondary | ICD-10-CM

## 2018-04-02 DIAGNOSIS — R630 Anorexia: Secondary | ICD-10-CM | POA: Insufficient documentation

## 2018-04-02 DIAGNOSIS — Z8041 Family history of malignant neoplasm of ovary: Secondary | ICD-10-CM

## 2018-04-02 DIAGNOSIS — Z9221 Personal history of antineoplastic chemotherapy: Secondary | ICD-10-CM | POA: Insufficient documentation

## 2018-04-02 DIAGNOSIS — Z86718 Personal history of other venous thrombosis and embolism: Secondary | ICD-10-CM | POA: Insufficient documentation

## 2018-04-02 DIAGNOSIS — N189 Chronic kidney disease, unspecified: Secondary | ICD-10-CM | POA: Diagnosis not present

## 2018-04-02 DIAGNOSIS — C7949 Secondary malignant neoplasm of other parts of nervous system: Secondary | ICD-10-CM | POA: Insufficient documentation

## 2018-04-02 DIAGNOSIS — R5381 Other malaise: Secondary | ICD-10-CM | POA: Diagnosis not present

## 2018-04-02 DIAGNOSIS — D6481 Anemia due to antineoplastic chemotherapy: Secondary | ICD-10-CM | POA: Insufficient documentation

## 2018-04-02 DIAGNOSIS — Z79818 Long term (current) use of other agents affecting estrogen receptors and estrogen levels: Secondary | ICD-10-CM

## 2018-04-02 DIAGNOSIS — G039 Meningitis, unspecified: Secondary | ICD-10-CM

## 2018-04-02 DIAGNOSIS — R55 Syncope and collapse: Secondary | ICD-10-CM | POA: Insufficient documentation

## 2018-04-02 DIAGNOSIS — R5383 Other fatigue: Secondary | ICD-10-CM | POA: Diagnosis not present

## 2018-04-02 DIAGNOSIS — Z87891 Personal history of nicotine dependence: Secondary | ICD-10-CM

## 2018-04-02 DIAGNOSIS — T451X5S Adverse effect of antineoplastic and immunosuppressive drugs, sequela: Secondary | ICD-10-CM | POA: Diagnosis not present

## 2018-04-02 DIAGNOSIS — R188 Other ascites: Secondary | ICD-10-CM | POA: Insufficient documentation

## 2018-04-02 DIAGNOSIS — H538 Other visual disturbances: Secondary | ICD-10-CM | POA: Diagnosis not present

## 2018-04-02 DIAGNOSIS — C50919 Malignant neoplasm of unspecified site of unspecified female breast: Secondary | ICD-10-CM

## 2018-04-02 DIAGNOSIS — Z800 Family history of malignant neoplasm of digestive organs: Secondary | ICD-10-CM | POA: Insufficient documentation

## 2018-04-02 DIAGNOSIS — Z9013 Acquired absence of bilateral breasts and nipples: Secondary | ICD-10-CM

## 2018-04-02 DIAGNOSIS — Z803 Family history of malignant neoplasm of breast: Secondary | ICD-10-CM | POA: Insufficient documentation

## 2018-04-02 DIAGNOSIS — C50411 Malignant neoplasm of upper-outer quadrant of right female breast: Secondary | ICD-10-CM | POA: Insufficient documentation

## 2018-04-02 DIAGNOSIS — R2689 Other abnormalities of gait and mobility: Secondary | ICD-10-CM

## 2018-04-02 DIAGNOSIS — Z170 Estrogen receptor positive status [ER+]: Secondary | ICD-10-CM | POA: Insufficient documentation

## 2018-04-02 DIAGNOSIS — N1330 Unspecified hydronephrosis: Secondary | ICD-10-CM | POA: Diagnosis not present

## 2018-04-02 DIAGNOSIS — Z79899 Other long term (current) drug therapy: Secondary | ICD-10-CM

## 2018-04-02 DIAGNOSIS — G96198 Other disorders of meninges, not elsewhere classified: Secondary | ICD-10-CM

## 2018-04-02 LAB — CBC WITH DIFFERENTIAL/PLATELET
Abs Immature Granulocytes: 0.01 10*3/uL (ref 0.00–0.07)
Basophils Absolute: 0 10*3/uL (ref 0.0–0.1)
Basophils Relative: 1 %
Eosinophils Absolute: 0.1 10*3/uL (ref 0.0–0.5)
Eosinophils Relative: 2 %
HCT: 36.4 % (ref 36.0–46.0)
HEMOGLOBIN: 11.8 g/dL — AB (ref 12.0–15.0)
IMMATURE GRANULOCYTES: 0 %
Lymphocytes Relative: 16 %
Lymphs Abs: 0.6 10*3/uL — ABNORMAL LOW (ref 0.7–4.0)
MCH: 29.9 pg (ref 26.0–34.0)
MCHC: 32.4 g/dL (ref 30.0–36.0)
MCV: 92.2 fL (ref 80.0–100.0)
Monocytes Absolute: 0.2 10*3/uL (ref 0.1–1.0)
Monocytes Relative: 4 %
NEUTROS PCT: 77 %
Neutro Abs: 2.9 10*3/uL (ref 1.7–7.7)
Platelets: 285 10*3/uL (ref 150–400)
RBC: 3.95 MIL/uL (ref 3.87–5.11)
RDW: 17.8 % — ABNORMAL HIGH (ref 11.5–15.5)
WBC: 3.7 10*3/uL — ABNORMAL LOW (ref 4.0–10.5)
nRBC: 0 % (ref 0.0–0.2)

## 2018-04-02 LAB — COMPREHENSIVE METABOLIC PANEL
ALT: 29 U/L (ref 0–44)
AST: 34 U/L (ref 15–41)
Albumin: 3.3 g/dL — ABNORMAL LOW (ref 3.5–5.0)
Alkaline Phosphatase: 124 U/L (ref 38–126)
Anion gap: 11 (ref 5–15)
BUN: 13 mg/dL (ref 6–20)
CO2: 27 mmol/L (ref 22–32)
Calcium: 9.1 mg/dL (ref 8.9–10.3)
Chloride: 98 mmol/L (ref 98–111)
Creatinine, Ser: 1.3 mg/dL — ABNORMAL HIGH (ref 0.44–1.00)
GFR calc non Af Amer: 46 mL/min — ABNORMAL LOW (ref >60)
GFR, EST AFRICAN AMERICAN: 53 mL/min — AB (ref >60)
Glucose, Bld: 116 mg/dL — ABNORMAL HIGH (ref 70–99)
Potassium: 3.7 mmol/L (ref 3.5–5.1)
Sodium: 136 mmol/L (ref 135–145)
Total Bilirubin: 0.4 mg/dL (ref 0.3–1.2)
Total Protein: 7 g/dL (ref 6.5–8.1)

## 2018-04-02 MED ORDER — HEPARIN SOD (PORK) LOCK FLUSH 100 UNIT/ML IV SOLN
INTRAVENOUS | Status: AC
  Filled 2018-04-02: qty 5

## 2018-04-02 MED ORDER — SODIUM CHLORIDE 0.9 % IV SOLN
Freq: Once | INTRAVENOUS | Status: AC
  Administered 2018-04-02: 15:00:00 via INTRAVENOUS
  Filled 2018-04-02: qty 250

## 2018-04-02 MED ORDER — FULVESTRANT 250 MG/5ML IM SOLN: 500.0000 mg | Freq: Once | INTRAMUSCULAR | Status: DC

## 2018-04-02 MED ORDER — HEPARIN SOD (PORK) LOCK FLUSH 100 UNIT/ML IV SOLN
500.0000 [IU] | Freq: Once | INTRAVENOUS | Status: AC
  Administered 2018-04-02: 500 [IU] via INTRAVENOUS

## 2018-04-02 MED ORDER — APIXABAN 2.5 MG PO TABS: 2.5000 mg | ORAL_TABLET | Freq: Two times a day (BID) | ORAL | 3 refills | Status: AC

## 2018-04-02 MED ORDER — AMLODIPINE BESYLATE 5 MG PO TABS: 5.0000 mg | ORAL_TABLET | Freq: Every day | ORAL | 1 refills | Status: DC

## 2018-04-02 NOTE — Progress Notes (Signed)
Hematology/Oncology Follow Up Note [facility]  Telephone:([phone] [phone]  Patient Care Team: [name], MD as PCP - General (Internal Medicine)   Name of the patient: Mary [name]  [id number]  1960/05/25   REASON FOR VISIT  follow-up for management of metastatic breast cancer.  Pertinent oncology history # She was diagnosed with right upper quadrant breast in August 2013, status post neoadjuvant chemotherapy with FEC x4 followed by weekly Taxol x2, underwent bilateral mastectomy [February 2014], right ALND, and left sentinel LN biopsy.  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  Tis N0.  Patient also received adjuvant radiation.   Patient was started initially on tamoxifen plus ovarian suppression with Zoladex in June 2014,  and changed to Aromasin plus Zoladex monthly since June 2015.follows up with Dr.Gudina Loleta Dicker at [facility] in [place]. reports that she twisted and sprained her left ankle when getting out of her car on 12/11/2017.  Ankle has been swollen.  #Patient presented to emergency room due to abnormal lab values.  Calcium level was 13.9 with creatinine 1.63.  Also endorses right lower quadrant/right flank/right lower back pain for the past couple of days.  Also having had episodes of dizziness which were diagnosed as vertigo since April 2019.  She had a negative MRI brain done at that time which were essentially negative for acute findings.  In the emergency room CT renal stone study were independently reviewed by me which showed bilateral hydronephrosis, due to the stricture at the level of UPJ's.  Masslike thickening of the anterior and the lateral bladder wall suspicious for metastatic implant.  Mesenteric, omental, peritoneal implants with small ascites and diffuse mesenteric edema.  Small to moderate bilateral pleural  effusion with associated compressive atelectasis of the lung.  Diffuse small osseous skeletal but stated disease.  Patient was seen and evaluated by urology Dr. Erlene Quan.  She is status post bilateral ureter stent placement and infiltrative bladder mass biopsy. Unfortunately her creatinine increased further during the admission and eventually she got bilateral percutaneous nephrostomy tube placed.  Creatinine gradually gets better after hydration. For her hypercalcemia, patient received 1 dose of calcitonin and 1 dose of Zometa.  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 She has visceral crisis with metastatic breast cancer.  In order to avoid further delay outpatient, she was started on first dose of Taxol 80 mg/m on 12/22/2017 after detailed discussion of chemotherapy side effects and complications.  #Left lower extremity DVT, she was anticoagulated with heparin drip.  Switched to Eliquis 5 mg twice daily outpatient. # Patient previously follows up at [facility] center.  She prefers to follow-up with [place] [facility] as it is closer to her home.  # # Seen [facility] for blurry vision, lab work up in process, pending plan to start ocular steroid injections.   # lumbar puncture was done and CSF fluid study showed negative culture, low glucose, elevated protein, elevated wbc.  Cytology is positive for malignancy, consistent with breast origin.  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  INTERVAL HISTORY 58 year old female with metastatic breast lobular carcinoma presents for management of metastatic breast cancer   #  Patient reports feeling weak and having  balancing issue.  Her vision has been gotten worse. She could not go to her Ommaya procedure last week.  Her husband was sick and when her sister-in-law checked the patient, patient was found to be unresponsive.  911 was called and when paramedics arrived, patient has regained consciousness.  Patient was sent to emergency room and was considered to have an syncope event secondary to dehydration.  Patient was giving IV fluid in ED.  #Patient did not tolerate Melene Muller took Weldon for 2 weeks].  Reports nausea and vomiting.  Symptoms improved after stopping of Ibrance. She was switched to abemaciclib 1 week ago.  Husband is concerned that patient not eating much probably dehydrated today. Patient reports good urinary output.  Denies any flank pain, fever or chills. Denies any diarrhea. Denies any pain.  Not taking any pain medication.   Review of Systems  Constitutional: Positive for malaise/fatigue. Negative for chills, fever and weight loss.  HENT: Negative for nosebleeds and sore throat.   Eyes: Negative for double vision, photophobia and redness.       Blurry vision  Respiratory: Negative for cough, shortness of breath and wheezing.   Cardiovascular: Negative for chest pain, palpitations, orthopnea and leg swelling.  Gastrointestinal: Positive for nausea. Negative for abdominal pain, blood in stool and vomiting.  Genitourinary: Negative for dysuria, flank pain, frequency and urgency.  Musculoskeletal: Negative for back pain, myalgias and neck pain.  Skin: Negative for itching and rash.  Neurological: Negative for dizziness and headaches.       Lost of balance. Fall 3-4 times.   Endo/Heme/Allergies: Negative for environmental allergies. Does not bruise/bleed easily.  Psychiatric/Behavioral: Negative for depression and hallucinations.      Allergies  Allergen Reactions  . Adhesive [Tape] Other (See Comments)    Redness:Please use paper tape     Past Medical History:    Diagnosis Date  . Anxiety    takes Xanax prn  . Breast cancer [facility]) August 2013   bilateral, er/pr+, bladder (02/2018..mets from breast ca)  . Chronic kidney disease 01/2018   recent shut down of kidneys with labs out of whack  . Complication of anesthesia    pt states she had the shakes extremely bad  . Cough    at night d/t reflux;nothing productive  . Dental crowns present    x 4  . Depression   . GERD (gastroesophageal reflux disease)    takes Nexium daily  . History of bronchitis    08/2011  . History of colon polyps   . Hot flashes, menopausal 08/08/2012  . IBS (irritable bowel syndrome)    immodium prn  . Insomnia    takes Ambien nightly  . Irritable bowel syndrome   . Neutropenia, drug-induced ([place]) 12/15/2011  . Nocturia   . Plantar fasciitis   . S/P radiation therapy 07/03/2012 through 08/18/2038                                                        Right chest wall/regional lymph nodes 5040 cGy 28 sessions, right chest wall/mastectomy scar boost 1000 cGy 5 sessions                          . Status post chemotherapy  FEC 100 starting on 12/08/2011 - 01/18/12/single agent Taxol x12 weeks from 02/02/2012 through January 2014   . Urinary frequency   . Use of tamoxifen (Nolvadex)    now stopped. will be starting ibrance on 03/05/18  . Vertigo      Past Surgical History:  Procedure Laterality Date  . BREAST LUMPECTOMY  2008   left  . COLONOSCOPY    . COLONOSCOPY WITH PROPOFOL N/A 10/10/2017   Procedure: COLONOSCOPY WITH PROPOFOL;  Surgeon: Lucilla Lame, MD;  Location: [facility] ENDOSCOPY;  Service: Endoscopy;  Laterality: N/A;  . CYSTOSCOPY WITH STENT PLACEMENT Bilateral 12/17/2017   Procedure: CYSTOSCOPY WITH STENT PLACEMENT;  Surgeon: Hollice Espy, MD;  Location: [facility] ORS;  Service: Urology;  Laterality: Bilateral;  . IR NEPHROSTOMY EXCHANGE LEFT  02/13/2018  . IR NEPHROSTOMY EXCHANGE RIGHT  01/02/2018  . IR NEPHROSTOMY EXCHANGE RIGHT  02/13/2018  . IR  NEPHROSTOMY PLACEMENT LEFT  12/19/2017  . IR NEPHROSTOMY PLACEMENT RIGHT  12/19/2017  . LUMBAR PUNCTURE  03/01/2018  . MASTECTOMY Bilateral 2013  . MASTECTOMY WITH AXILLARY LYMPH NODE DISSECTION Right 05/18/2012   Procedure: MASTECTOMY WITH AXILLARY LYMPH NODE DISSECTION;  Surgeon: Adin Hector, MD;  Location: [place];  Service: General;  Laterality: Right;  Bilateral Total Mastectomy , right axillary lymph node dissection left axillary sentinel node biopsy, removal of Porta Cath  . NEPHROSTOMY TUBE REMOVAL Bilateral 03/01/2018   removed tubes in dr. Erlene Quan office  . PORT-A-CATH REMOVAL N/A 05/18/2012   Procedure: REMOVAL PORT-A-CATH;  Surgeon: Adin Hector, MD;  Location: [place];  Service: General;  Laterality: N/A;  . PORTA CATH INSERTION N/A 12/26/2017   Procedure: PORTA CATH INSERTION;  Surgeon: Katha Cabal, MD;  Location: [place] [facility];  Service: Cardiovascular;  Laterality: N/A;  . PORTACATH PLACEMENT  12/02/2011   Procedure: INSERTION PORT-A-CATH;  Surgeon: Adin Hector, MD;  Location: [place];  Service: General;  Laterality: N/A;  insertion port a cath with fluoroscopy  . SIMPLE MASTECTOMY WITH AXILLARY SENTINEL NODE BIOPSY Left 05/18/2012   Procedure: SIMPLE MASTECTOMY WITH AXILLARY SENTINEL NODE BIOPSY;  Surgeon: Adin Hector, MD;  Location: [place];  Service: General;  Laterality: Left;    Social History   Socioeconomic History  . Marital status: Married    Spouse name: Jenny Reichmann  . Number of children: 0  . Years of education: Not on file  . Highest education level: Not on file  Occupational History  . Occupation: Network engineer II    Employer: [employer]    Comment: on fmla d/t seizure in 10/19  Social Needs  . Financial resource strain: Not on file  . Food insecurity:    Worry: Not on file    Inability: Not on file  . Transportation needs:    Medical: Not on file    Non-medical: Not on file  Tobacco Use  . Smoking status: Former Smoker      Packs/day: 0.50    Years: 15.00    Pack years: 7.50    Types: Cigarettes    Last attempt to quit: 03/21/1997    Years since quitting: 21.0  . Smokeless tobacco: Never Used  Substance and Sexual Activity  . Alcohol use: Not Currently    Alcohol/week: 0.0 standard drinks    Comment: occasionally wine  . Drug use: No  . Sexual activity: Yes    Birth control/protection: None  Lifestyle  . Physical activity:    Days per week: Not on file  Minutes per session: Not on file  . Stress: Not on file  Relationships  . Social connections:    Talks on phone: Not on file    Gets together: Not on file    Attends religious service: Not on file    Active member of club or organization: Not on file    Attends meetings of clubs or organizations: Not on file    Relationship status: Not on file  . Intimate partner violence:    Fear of current or ex partner: Not on file    Emotionally abused: Not on file    Physically abused: Not on file    Forced sexual activity: Not on file  Other Topics Concern  . Not on file  Social History Narrative   Married to [employer]  No children 42 years old with fmp.  20 year use of birth control pills.      Family History  Problem Relation Age of Onset  . Hypertension Mother   . Diabetes Mother   . Hypertension Maternal Grandmother   . Pancreatic cancer Paternal Grandmother        diagnosed in her 68s  . Ovarian cancer Maternal Aunt        maternal grandmother's sister  . Breast cancer Paternal Aunt        diagnosed in late 3s early 31s  . Parkinsonism Paternal Grandfather      Current Outpatient Medications:  .  abemaciclib (VERZENIO) 150 MG tablet, Take 1 tablet (150 mg total) by mouth 2 (two) times daily., Disp: 56 tablet, Rfl: 2 .  amLODipine (NORVASC) 5 MG tablet, Take 1 tablet (5 mg total) by mouth daily., Disp: 90 tablet, Rfl: 1 .  apixaban (ELIQUIS) 2.5 MG TABS tablet, Take 1 tablet (2.5 mg total) by mouth 2 (two) times daily., Disp: 60  tablet, Rfl: 3 .  Ascorbic Acid (VITAMIN C) 100 MG tablet, Take 100 mg by mouth daily.  , Disp: , Rfl:  .  Calcium Carbonate-Vitamin D (CALTRATE 600+D PO), Take 1 tablet by mouth daily., Disp: , Rfl:  .  diazepam (VALIUM) 10 MG tablet, Take 1 tablet (10 mg total) by mouth at bedtime as needed for anxiety., Disp: 30 tablet, Rfl: 5 .  esomeprazole (NEXIUM) 40 MG capsule, Take 40 mg by mouth daily as needed. , Disp: , Rfl:  .  ferrous sulfate 325 (65 FE) MG EC tablet, Take 1 tablet (325 mg total) by mouth daily., Disp: 30 tablet, Rfl: 3 .  fulvestrant (FASLODEX) 250 MG/5ML injection, Inject 250 mg into the muscle every 14 (fourteen) days. One injection each buttock over 1-2 minutes. Warm prior to use., Disp: , Rfl:  .  furosemide (LASIX) 20 MG tablet, Take 1 tablet (20 mg total) by mouth daily as needed., Disp: 30 tablet, Rfl: 1 .  goserelin (ZOLADEX) 3.6 MG injection, Inject 3.6 mg into the skin every 28 (twenty-eight) days., Disp: , Rfl:  .  lidocaine-prilocaine (EMLA) cream, Apply to affected area once, Disp: 30 g, Rfl: 3 .  loperamide (IMODIUM A-D) 2 MG tablet, Take 2 mg by mouth 4 (four) times daily as needed for diarrhea or loose stools. , Disp: , Rfl:  .  Multiple Vitamin (MULTIVITAMIN) tablet, Take 1 tablet by mouth daily.  , Disp: , Rfl:  .  ondansetron (ZOFRAN) 4 MG tablet, Take 1 tablet (4 mg total) by mouth every 6 (six) hours as needed for nausea or vomiting., Disp: 60 tablet, Rfl: 1 .  oxybutynin (DITROPAN)  5 MG tablet, Take 1 tablet (5 mg total) by mouth every 8 (eight) hours as needed for bladder spasms., Disp: 20 tablet, Rfl: 0 .  oxyCODONE (OXY IR/ROXICODONE) 5 MG immediate release tablet, Take 1 tablet (5 mg total) by mouth every 6 (six) hours as needed for severe pain., Disp: 30 tablet, Rfl: 0 .  oxyCODONE (OXYCONTIN) 10 mg 12 hr tablet, Take 1 tablet (10 mg total) by mouth every 12 (twelve) hours., Disp: 28 tablet, Rfl: 0 .  potassium chloride SA (K-DUR,KLOR-CON) 20 MEQ tablet, Take  1 tablet (20 mEq total) by mouth daily., Disp: 30 tablet, Rfl: 0 .  prochlorperazine (COMPAZINE) 10 MG tablet, Take 1 tablet (10 mg total) by mouth every 6 (six) hours as needed (Nausea or vomiting)., Disp: 30 tablet, Rfl: 1 .  scopolamine (TRANSDERM-SCOP) 1 MG/3DAYS, Place 1 patch (1.5 mg total) onto the skin every 3 (three) days., Disp: 10 patch, Rfl: 0 .  traZODone (DESYREL) 50 MG tablet, Take 0.5-1 tablets (25-50 mg total) by mouth at bedtime as needed for sleep., Disp: 90 tablet, Rfl: 1 .  dexamethasone (DECADRON) 4 MG tablet, Take 1 tablet (4 mg total) by mouth See admin instructions. You may take 4 mg daily for 2 days after chemotherapy if needed for nausea (Patient not taking: Reported on 04/02/2018), Disp: 30 tablet, Rfl: 0  Physical exam: ECOG 1  Vitals:   04/02/18 1346  BP: (!) 134/94  Pulse: (!) 101  Resp: 18  Temp: (!) 97.1 F (36.2 C)  TempSrc: Tympanic   Physical Exam Constitutional:      General: She is not in acute distress. HENT:     Head: Normocephalic and atraumatic.  Eyes:     General: No scleral icterus.    Pupils: Pupils are equal, round, and reactive to light.  Neck:     Musculoskeletal: Normal range of motion and neck supple.  Cardiovascular:     Rate and Rhythm: Normal rate and regular rhythm.     Heart sounds: Normal heart sounds.  Pulmonary:     Effort: Pulmonary effort is normal. No respiratory distress.     Breath sounds: No wheezing.  Abdominal:     General: Bowel sounds are normal. There is no distension.     Palpations: Abdomen is soft. There is no mass.     Tenderness: There is no abdominal tenderness.  Musculoskeletal: Normal range of motion.        General: No deformity.  Skin:    General: Skin is warm and dry.     Findings: No erythema or rash.  Neurological:     General: No focal deficit present.     Mental Status: She is alert and oriented to person, place, and time.     Cranial Nerves: No cranial nerve deficit.     Coordination:  Coordination normal.  Psychiatric:        Behavior: Behavior normal.        Thought Content: Thought content normal.     CMP Latest Ref Rng & Units 04/02/2018  Glucose 70 - 99 mg/dL 116(H)  BUN 6 - 20 mg/dL 13  Creatinine 0.44 - 1.00 mg/dL 1.30(H)  Sodium 135 - 145 mmol/L 136  Potassium 3.5 - 5.1 mmol/L 3.7  Chloride 98 - 111 mmol/L 98  CO2 22 - 32 mmol/L 27  Calcium 8.9 - 10.3 mg/dL 9.1  Total Protein 6.5 - 8.1 g/dL 7.0  Total Bilirubin 0.3 - 1.2 mg/dL 0.4  Alkaline Phos 38 - 126  U/L 124  AST 15 - 41 U/L 34  ALT 0 - 44 U/L 29   CBC Latest Ref Rng & Units 04/02/2018  WBC 4.0 - 10.5 K/uL 3.7(L)  Hemoglobin 12.0 - 15.0 g/dL 11.8(L)  Hematocrit 36.0 - 46.0 % 36.4  Platelets 150 - 400 K/uL 285    Mr Jeri Cos Wo Contrast  Result Date: 03/05/2018 CLINICAL DATA:  58 y/o F; recurrent breast cancer. Abnormal lumbar puncture 03/01/2018. EXAM: MRI HEAD WITHOUT AND WITH CONTRAST TECHNIQUE: Multiplanar, multiecho pulse sequences of the brain and surrounding structures were obtained without and with intravenous contrast. CONTRAST:  6.5 cc Gadavist. COMPARISON:  01/03/2018 MRI of the head. 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 No reduced diffusion to suggest acute or early subacute infarction. No new focus of susceptibility hypointensity to indicate interval intracranial hemorrhage. No mass effect, extra-axial collection, or hydrocephalus. Mild stable diffuse volume loss of the brain. After administration of intravenous contrast there is subtle enhancement in the fundus of right internal auditory canal and at the porus acusticus has of left internal auditory canal (series 16 image 46 and 48). Vascular: Normal flow voids. Skull and upper cervical spine: Heterogeneous decreased bone marrow signal similar to prior MRI of the head. Sinuses/Orbits: Negative. Other: None. IMPRESSION: 1. Subtle enhancement at the fundus of right  internal auditory canal and porus acusticus of left internal auditory canal may represent leptomeningeal metastatic disease. No brain parenchymal metastasis identified. Consider dedicated internal auditory canal MRI with and without contrast for confirmation. 2. No additional acute intracranial abnormality identified. 3. Stable heterogeneity of bone marrow which may represent sequelae of metastatic disease or chemotherapy. These results will be called to the ordering clinician or representative by the Radiologist Assistant, and communication documented in the PACS or zVision Dashboard. Electronically Signed   By: Kristine Garbe M.D.   On: 03/05/2018 18:59     Assessment and plan Patient is a 58 y.o. female with metastatic breast cancer, visceral crisis present for follow-up for management of cancer related symptoms and recent ED visit. 1. Metastatic breast cancer ([place])   2. AKI (acute kidney injury) ([place])   3. Leptomeningeal disease   4. Port-A-Cath in place   5. Non-intractable vomiting with nausea, unspecified vomiting type   6. Dehydration    #Dehydration, AKI, likely secondary to poor oral intake. Will give patient 1 L of IV normal saline x1. Compazine 10 mg IV x1 for nausea. Patient to repeat BMP tomorrow.  Also for additional IV hydration session.  Further management plan depending on repeat labs.  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 Talbot Grumbling last week due to dehydration and syncope. I contacted Dr.Jaikumar and he will reschedule patient as soon as possible. Patient was supposed also need to have a CT image done prior to the procedure.  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  She has been contacted by [facility] neurosurgery Dr. Talbot Grumbling for placement of Ommaya reservoir.  She will also have a CT head image study done at [facility] prior to  consultation. Just started on abemaciclib.  Tolerates well.  No diarrhea.  Advised patient to continue Abemaciclib, which is a CDK 4/6 inhibitor.  Okay to interrupt prior to procedure and resume after procedure if neurosurgeon desires.  #Loss of balance, blurry vision, nausea I suspect these neurologic symptoms are secondary to worsening of leptomeningeal metastatic breast cancer involvement.  Hopefully starting intrathecal chemotherapy soon after Ommaya reservoir placed.  #Proceed with Zoladex and Faslodex in 2 weeks   #History of distal lower extremity DVT, finished 3 months of full anticoagulation. Given that she is having problems with balancing as well as other neurologic symptoms, risk of fall is high.  Switch to 2.5 mg twice daily as maintenance.  Refill sent to pharmacy. As we discussed, she should hold her Eliquis for 2 days prior to the procedure and resume Eliquis 2 days after procedure.  #Mediport to be flush every 6 weeks   Follow-up in 1 week   Earlie Server, MD, PhD Hematology Oncology [employer] at [facility] Pager- [phone] 04/02/2018

## 2018-04-02 NOTE — Addendum Note (Signed)
Addended by: Sinclair Grooms D on: 04/02/2018 03:14 PM   Modules accepted: Orders

## 2018-04-02 NOTE — Progress Notes (Signed)
Patient here for follow up. She is weak, has balance issues, vision getting worse. Patient has had about 3-4 falls since last visit. Brought Ibrance today so we can discard. Pt was in ED last week due to dehydration.

## 2018-04-03 ENCOUNTER — Inpatient Hospital Stay: Payer: 59

## 2018-04-03 ENCOUNTER — Other Ambulatory Visit: Payer: Self-pay | Admitting: Oncology

## 2018-04-03 ENCOUNTER — Ambulatory Visit
Admission: RE | Admit: 2018-04-03 | Discharge: 2018-04-03 | Disposition: A | Discharge status: Home / Self Care | Payer: 59 | Source: Ambulatory Visit | Attending: Oncology | Admitting: Oncology

## 2018-04-03 DIAGNOSIS — C7949 Secondary malignant neoplasm of other parts of nervous system: Secondary | ICD-10-CM | POA: Diagnosis not present

## 2018-04-03 DIAGNOSIS — Z79818 Long term (current) use of other agents affecting estrogen receptors and estrogen levels: Secondary | ICD-10-CM | POA: Diagnosis not present

## 2018-04-03 DIAGNOSIS — D6481 Anemia due to antineoplastic chemotherapy: Secondary | ICD-10-CM | POA: Diagnosis not present

## 2018-04-03 DIAGNOSIS — Z923 Personal history of irradiation: Secondary | ICD-10-CM | POA: Diagnosis not present

## 2018-04-03 DIAGNOSIS — C50412 Malignant neoplasm of upper-outer quadrant of left female breast: Secondary | ICD-10-CM

## 2018-04-03 DIAGNOSIS — Z5111 Encounter for antineoplastic chemotherapy: Secondary | ICD-10-CM | POA: Diagnosis not present

## 2018-04-03 DIAGNOSIS — N179 Acute kidney failure, unspecified: Secondary | ICD-10-CM | POA: Insufficient documentation

## 2018-04-03 DIAGNOSIS — Z9221 Personal history of antineoplastic chemotherapy: Secondary | ICD-10-CM | POA: Diagnosis not present

## 2018-04-03 DIAGNOSIS — C50411 Malignant neoplasm of upper-outer quadrant of right female breast: Secondary | ICD-10-CM | POA: Diagnosis not present

## 2018-04-03 DIAGNOSIS — Z170 Estrogen receptor positive status [ER+]: Secondary | ICD-10-CM | POA: Diagnosis not present

## 2018-04-03 DIAGNOSIS — N200 Calculus of kidney: Secondary | ICD-10-CM | POA: Diagnosis not present

## 2018-04-03 DIAGNOSIS — Z9013 Acquired absence of bilateral breasts and nipples: Secondary | ICD-10-CM | POA: Diagnosis not present

## 2018-04-03 LAB — BASIC METABOLIC PANEL
Anion gap: 9 (ref 5–15)
BUN: 14 mg/dL (ref 6–20)
CO2: 26 mmol/L (ref 22–32)
Calcium: 9.1 mg/dL (ref 8.9–10.3)
Chloride: 102 mmol/L (ref 98–111)
Creatinine, Ser: 1.31 mg/dL — ABNORMAL HIGH (ref 0.44–1.00)
GFR calc Af Amer: 53 mL/min — ABNORMAL LOW (ref >60)
GFR calc non Af Amer: 45 mL/min — ABNORMAL LOW (ref >60)
Glucose, Bld: 106 mg/dL — ABNORMAL HIGH (ref 70–99)
Potassium: 3.7 mmol/L (ref 3.5–5.1)
Sodium: 137 mmol/L (ref 135–145)

## 2018-04-03 MED ORDER — SODIUM CHLORIDE 0.9% FLUSH
10.0000 mL | Freq: Once | INTRAVENOUS | Status: AC
  Administered 2018-04-03: 10 mL via INTRAVENOUS
  Filled 2018-04-03: qty 10

## 2018-04-03 MED ORDER — SODIUM CHLORIDE 0.9 % IV SOLN
Freq: Once | INTRAVENOUS | Status: AC
  Administered 2018-04-03: 14:00:00 via INTRAVENOUS
  Filled 2018-04-03: qty 250

## 2018-04-03 MED ORDER — HEPARIN SOD (PORK) LOCK FLUSH 100 UNIT/ML IV SOLN
INTRAVENOUS | Status: AC
  Filled 2018-04-03: qty 5

## 2018-04-03 MED ORDER — HEPARIN SOD (PORK) LOCK FLUSH 100 UNIT/ML IV SOLN
500.0000 [IU] | Freq: Once | INTRAVENOUS | Status: AC
  Administered 2018-04-03: 500 [IU] via INTRAVENOUS

## 2018-04-03 NOTE — Progress Notes (Signed)
S 

## 2018-04-04 ENCOUNTER — Inpatient Hospital Stay (HOSPITAL_BASED_OUTPATIENT_CLINIC_OR_DEPARTMENT_OTHER): Payer: 59 | Admitting: Oncology

## 2018-04-04 ENCOUNTER — Ambulatory Visit
Admission: RE | Admit: 2018-04-04 | Discharge: 2018-04-04 | Disposition: A | Discharge status: Home / Self Care | Payer: 59 | Source: Ambulatory Visit | Attending: Oncology | Admitting: Oncology

## 2018-04-04 ENCOUNTER — Other Ambulatory Visit: Payer: Self-pay | Admitting: *Deleted

## 2018-04-04 ENCOUNTER — Other Ambulatory Visit: Payer: Self-pay | Admitting: Oncology

## 2018-04-04 ENCOUNTER — Other Ambulatory Visit: Payer: Self-pay

## 2018-04-04 ENCOUNTER — Telehealth: Payer: Self-pay | Admitting: Urology

## 2018-04-04 ENCOUNTER — Inpatient Hospital Stay: Payer: 59

## 2018-04-04 VITALS — BP 129/86 | HR 92 | Resp 20

## 2018-04-04 DIAGNOSIS — C50919 Malignant neoplasm of unspecified site of unspecified female breast: Secondary | ICD-10-CM | POA: Insufficient documentation

## 2018-04-04 DIAGNOSIS — N179 Acute kidney failure, unspecified: Secondary | ICD-10-CM

## 2018-04-04 DIAGNOSIS — G96198 Other disorders of meninges, not elsewhere classified: Secondary | ICD-10-CM

## 2018-04-04 DIAGNOSIS — Z9013 Acquired absence of bilateral breasts and nipples: Secondary | ICD-10-CM

## 2018-04-04 DIAGNOSIS — Z87891 Personal history of nicotine dependence: Secondary | ICD-10-CM

## 2018-04-04 DIAGNOSIS — R630 Anorexia: Secondary | ICD-10-CM

## 2018-04-04 DIAGNOSIS — Z9221 Personal history of antineoplastic chemotherapy: Secondary | ICD-10-CM

## 2018-04-04 DIAGNOSIS — Z95828 Presence of other vascular implants and grafts: Secondary | ICD-10-CM | POA: Diagnosis not present

## 2018-04-04 DIAGNOSIS — Z79818 Long term (current) use of other agents affecting estrogen receptors and estrogen levels: Secondary | ICD-10-CM

## 2018-04-04 DIAGNOSIS — D6481 Anemia due to antineoplastic chemotherapy: Secondary | ICD-10-CM | POA: Diagnosis not present

## 2018-04-04 DIAGNOSIS — G039 Meningitis, unspecified: Secondary | ICD-10-CM | POA: Diagnosis not present

## 2018-04-04 DIAGNOSIS — Z79899 Other long term (current) drug therapy: Secondary | ICD-10-CM

## 2018-04-04 DIAGNOSIS — Z923 Personal history of irradiation: Secondary | ICD-10-CM

## 2018-04-04 DIAGNOSIS — C7949 Secondary malignant neoplasm of other parts of nervous system: Secondary | ICD-10-CM | POA: Diagnosis not present

## 2018-04-04 DIAGNOSIS — R188 Other ascites: Secondary | ICD-10-CM | POA: Diagnosis not present

## 2018-04-04 DIAGNOSIS — E860 Dehydration: Secondary | ICD-10-CM

## 2018-04-04 DIAGNOSIS — R5381 Other malaise: Secondary | ICD-10-CM

## 2018-04-04 DIAGNOSIS — C50411 Malignant neoplasm of upper-outer quadrant of right female breast: Secondary | ICD-10-CM | POA: Diagnosis not present

## 2018-04-04 DIAGNOSIS — Z170 Estrogen receptor positive status [ER+]: Secondary | ICD-10-CM

## 2018-04-04 DIAGNOSIS — T451X5S Adverse effect of antineoplastic and immunosuppressive drugs, sequela: Secondary | ICD-10-CM | POA: Diagnosis not present

## 2018-04-04 DIAGNOSIS — Z803 Family history of malignant neoplasm of breast: Secondary | ICD-10-CM

## 2018-04-04 DIAGNOSIS — C50412 Malignant neoplasm of upper-outer quadrant of left female breast: Secondary | ICD-10-CM

## 2018-04-04 DIAGNOSIS — Z5111 Encounter for antineoplastic chemotherapy: Secondary | ICD-10-CM | POA: Diagnosis not present

## 2018-04-04 DIAGNOSIS — Z8041 Family history of malignant neoplasm of ovary: Secondary | ICD-10-CM

## 2018-04-04 DIAGNOSIS — T451X5A Adverse effect of antineoplastic and immunosuppressive drugs, initial encounter: Secondary | ICD-10-CM

## 2018-04-04 DIAGNOSIS — Z853 Personal history of malignant neoplasm of breast: Secondary | ICD-10-CM | POA: Diagnosis not present

## 2018-04-04 DIAGNOSIS — Z800 Family history of malignant neoplasm of digestive organs: Secondary | ICD-10-CM

## 2018-04-04 DIAGNOSIS — N1339 Other hydronephrosis: Secondary | ICD-10-CM

## 2018-04-04 DIAGNOSIS — N189 Chronic kidney disease, unspecified: Secondary | ICD-10-CM

## 2018-04-04 DIAGNOSIS — Z7901 Long term (current) use of anticoagulants: Secondary | ICD-10-CM

## 2018-04-04 DIAGNOSIS — N1330 Unspecified hydronephrosis: Secondary | ICD-10-CM

## 2018-04-04 DIAGNOSIS — Z86718 Personal history of other venous thrombosis and embolism: Secondary | ICD-10-CM

## 2018-04-04 DIAGNOSIS — H538 Other visual disturbances: Secondary | ICD-10-CM

## 2018-04-04 MED ORDER — SODIUM CHLORIDE 0.9% FLUSH
10.0000 mL | Freq: Once | INTRAVENOUS | Status: AC
  Administered 2018-04-04: 10 mL via INTRAVENOUS
  Filled 2018-04-04: qty 10

## 2018-04-04 MED ORDER — SODIUM CHLORIDE 0.9 % IV SOLN
Freq: Once | INTRAVENOUS | Status: AC
  Administered 2018-04-04: 15:00:00 via INTRAVENOUS
  Filled 2018-04-04: qty 250

## 2018-04-04 MED ORDER — DRONABINOL 5 MG PO CAPS: 5.0000 mg | ORAL_CAPSULE | Freq: Two times a day (BID) | ORAL | 0 refills | Status: DC

## 2018-04-04 MED ORDER — HEPARIN SOD (PORK) LOCK FLUSH 100 UNIT/ML IV SOLN
500.0000 [IU] | Freq: Once | INTRAVENOUS | Status: AC
  Administered 2018-04-04: 500 [IU] via INTRAVENOUS
  Filled 2018-04-04: qty 5

## 2018-04-04 NOTE — Progress Notes (Signed)
Hematology/Oncology Follow Up Note [facility]  [phone] [phone]  Patient Care Team: [name], MD as PCP - General (Internal Medicine)   Name of the patient: Mary [name]  [id number]  Oct 18, 1960   REASON FOR VISIT  follow-up for management of metastatic breast cancer.  Pertinent oncology history # She was diagnosed with right upper quadrant breast in August 2013, status post neoadjuvant chemotherapy with FEC x4 followed by weekly Taxol x2, underwent bilateral mastectomy [February 2014], right ALND, and left sentinel LN biopsy.  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  Tis N0.  Patient also received adjuvant radiation.   Patient was started initially on tamoxifen plus ovarian suppression with Zoladex in June 2014,  and changed to Aromasin plus Zoladex monthly since June 2015.follows up with Dr.Gudina Loleta Dicker at [facility] in [place]. reports that she twisted and sprained her left ankle when getting out of her car on 12/11/2017.  Ankle has been swollen.  #Patient presented to emergency room due to abnormal lab values.  Calcium level was 13.9 with creatinine 1.63.  Also endorses right lower quadrant/right flank/right lower back pain for the past couple of days.  Also having had episodes of dizziness which were diagnosed as vertigo since April 2019.  She had a negative MRI brain done at that time which were essentially negative for acute findings.  In the emergency room CT renal stone study were independently reviewed by me which showed bilateral hydronephrosis, due to the stricture at the level of UPJ's.  Masslike thickening of the anterior and the lateral bladder wall suspicious for metastatic implant.  Mesenteric, omental, peritoneal implants with small ascites and diffuse mesenteric edema.  Small to moderate bilateral pleural  effusion with associated compressive atelectasis of the lung.  Diffuse small osseous skeletal but stated disease.  Patient was seen and evaluated by urology Dr. Erlene Quan.  She is status post bilateral ureter stent placement and infiltrative bladder mass biopsy. Unfortunately her creatinine increased further during the admission and eventually she got bilateral percutaneous nephrostomy tube placed.  Creatinine gradually gets better after hydration. For her hypercalcemia, patient received 1 dose of calcitonin and 1 dose of Zometa.  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 She has visceral crisis with metastatic breast cancer.  In order to avoid further delay outpatient, she was started on first dose of Taxol 80 mg/m on 12/22/2017 after detailed discussion of chemotherapy side effects and complications.  #Left lower extremity DVT, she was anticoagulated with heparin drip.  Switched to Eliquis 5 mg twice daily outpatient. # Patient previously follows up at [facility] center.  She prefers to follow-up with [place] [facility] as it is closer to her home.  # # Seen [facility] for blurry vision, lab work up in process, pending plan to start ocular steroid injections.   # lumbar puncture was done and CSF fluid study showed negative culture, low glucose, elevated protein, elevated wbc.  Cytology is positive for malignancy, consistent with breast origin.  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  INTERVAL HISTORY 58 year old female with metastatic breast lobular carcinoma presents for discussion of US renal and CT  scan results, and further management plan for  metastatic breast cancer.   Since her creatinine level continue to be elevated despite IV hydration, obtained US renal which showed bilateral hydronephrosis.  CT chest abdomen pelvis was obtained.  Patient and husband came for IV hydration session and also wants to discuss US renal and CT results.   Continues to feel weak, no appetite. Worsened abdominal distension which she noticed this morning.  Appointment with [facility] Neurosurgeon is schedule next week.    Review of Systems  Constitutional: Positive for malaise/fatigue. Negative for chills, fever and weight loss.  HENT: Negative for nosebleeds and sore throat.   Eyes: Negative for double vision, photophobia and redness.       Blurry vision  Respiratory: Negative for cough, shortness of breath and wheezing.   Cardiovascular: Negative for chest pain, palpitations, orthopnea and leg swelling.  Gastrointestinal: Positive for nausea. Negative for abdominal pain, blood in stool and vomiting.  Genitourinary: Negative for dysuria, flank pain, frequency and urgency.  Musculoskeletal: Negative for back pain, myalgias and neck pain.  Skin: Negative for itching and rash.  Neurological: Negative for dizziness, tingling, tremors and headaches.       Lost of balance.  Endo/Heme/Allergies: Negative for environmental allergies. Does not bruise/bleed easily.  Psychiatric/Behavioral: Negative for depression and hallucinations.      Allergies  Allergen Reactions  . Adhesive [Tape] Other (See Comments)    Redness:Please use paper tape     Past Medical History:  Diagnosis Date  . Anxiety    takes Xanax prn  . Breast cancer [facility]) August 2013   bilateral, er/pr+, bladder (02/2018..mets from breast ca)  . Chronic kidney disease 01/2018   recent shut down of kidneys with labs out of whack  . Complication of anesthesia    pt states she had the shakes extremely bad  . Cough    at night d/t reflux;nothing productive  . Dental crowns present    x 4   . Depression   . GERD (gastroesophageal reflux disease)    takes Nexium daily  . History of bronchitis    08/2011  . History of colon polyps   . Hot flashes, menopausal 08/08/2012  . IBS (irritable bowel syndrome)    immodium prn  . Insomnia    takes Ambien nightly  . Irritable bowel syndrome   . Neutropenia, drug-induced ([place]) 12/15/2011  . Nocturia   . Plantar fasciitis   . S/P radiation therapy 07/03/2012 through 08/18/2038                                                        Right chest wall/regional lymph nodes 5040 cGy 28 sessions, right chest wall/mastectomy scar boost 1000 cGy 5 sessions                          . Status post chemotherapy    FEC 100 starting on 12/08/2011 - 01/18/12/single agent Taxol x12 weeks from 02/02/2012 through January 2014   . Urinary frequency   . Use of tamoxifen (Nolvadex)    now stopped. will be starting ibrance on 03/05/18  . Vertigo      Past Surgical History:  Procedure Laterality Date  . BREAST LUMPECTOMY  2008   left  . COLONOSCOPY    .  COLONOSCOPY WITH PROPOFOL N/A 10/10/2017   Procedure: COLONOSCOPY WITH PROPOFOL;  Surgeon: Lucilla Lame, MD;  Location: [facility] ENDOSCOPY;  Service: Endoscopy;  Laterality: N/A;  . CYSTOSCOPY WITH STENT PLACEMENT Bilateral 12/17/2017   Procedure: CYSTOSCOPY WITH STENT PLACEMENT;  Surgeon: Hollice Espy, MD;  Location: [facility] ORS;  Service: Urology;  Laterality: Bilateral;  . IR NEPHROSTOMY EXCHANGE LEFT  02/13/2018  . IR NEPHROSTOMY EXCHANGE RIGHT  01/02/2018  . IR NEPHROSTOMY EXCHANGE RIGHT  02/13/2018  . IR NEPHROSTOMY PLACEMENT LEFT  12/19/2017  . IR NEPHROSTOMY PLACEMENT RIGHT  12/19/2017  . LUMBAR PUNCTURE  03/01/2018  . MASTECTOMY Bilateral 2013  . MASTECTOMY WITH AXILLARY LYMPH NODE DISSECTION Right 05/18/2012   Procedure: MASTECTOMY WITH AXILLARY LYMPH NODE DISSECTION;  Surgeon: Adin Hector, MD;  Location: [place];  Service: General;  Laterality: Right;  Bilateral Total Mastectomy , right  axillary lymph node dissection left axillary sentinel node biopsy, removal of Porta Cath  . NEPHROSTOMY TUBE REMOVAL Bilateral 03/01/2018   removed tubes in dr. Erlene Quan office  . PORT-A-CATH REMOVAL N/A 05/18/2012   Procedure: REMOVAL PORT-A-CATH;  Surgeon: Adin Hector, MD;  Location: [place];  Service: General;  Laterality: N/A;  . PORTA CATH INSERTION N/A 12/26/2017   Procedure: PORTA CATH INSERTION;  Surgeon: Katha Cabal, MD;  Location: [place] CV LAB;  Service: Cardiovascular;  Laterality: N/A;  . PORTACATH PLACEMENT  12/02/2011   Procedure: INSERTION PORT-A-CATH;  Surgeon: Adin Hector, MD;  Location: [place];  Service: General;  Laterality: N/A;  insertion port a cath with fluoroscopy  . SIMPLE MASTECTOMY WITH AXILLARY SENTINEL NODE BIOPSY Left 05/18/2012   Procedure: SIMPLE MASTECTOMY WITH AXILLARY SENTINEL NODE BIOPSY;  Surgeon: Adin Hector, MD;  Location: [place];  Service: General;  Laterality: Left;    Social History   Socioeconomic History  . Marital status: Married    Spouse name: Jenny Reichmann  . Number of children: 0  . Years of education: Not on file  . Highest education level: Not on file  Occupational History  . Occupation: Network engineer II    Employer: [employer]    Comment: on fmla d/t seizure in 10/19  Social Needs  . Financial resource strain: Not on file  . Food insecurity:    Worry: Not on file    Inability: Not on file  . Transportation needs:    Medical: Not on file    Non-medical: Not on file  Tobacco Use  . Smoking status: Former Smoker    Packs/day: 0.50    Years: 15.00    Pack years: 7.50    Types: Cigarettes    Last attempt to quit: 03/21/1997    Years since quitting: 21.0  . Smokeless tobacco: Never Used  Substance and Sexual Activity  . Alcohol use: Not Currently    Alcohol/week: 0.0 standard drinks    Comment: occasionally wine  . Drug use: No  . Sexual activity: Yes    Birth control/protection: None  Lifestyle    . Physical activity:    Days per week: Not on file    Minutes per session: Not on file  . Stress: Not on file  Relationships  . Social connections:    Talks on phone: Not on file    Gets together: Not on file    Attends religious service: Not on file    Active member of club or organization: Not on file    Attends meetings of clubs or organizations: Not on file  Relationship status: Not on file  . Intimate partner violence:    Fear of current or ex partner: Not on file    Emotionally abused: Not on file    Physically abused: Not on file    Forced sexual activity: Not on file  Other Topics Concern  . Not on file  Social History Narrative   Married to [employer]  No children 78 years old with fmp.  20 year use of birth control pills.      Family History  Problem Relation Age of Onset  . Hypertension Mother   . Diabetes Mother   . Hypertension Maternal Grandmother   . Pancreatic cancer Paternal Grandmother        diagnosed in her 43s  . Ovarian cancer Maternal Aunt        maternal grandmother's sister  . Breast cancer Paternal Aunt        diagnosed in late 61s early 80s  . Parkinsonism Paternal Grandfather      Current Outpatient Medications:  .  abemaciclib (VERZENIO) 150 MG tablet, Take 1 tablet (150 mg total) by mouth 2 (two) times daily., Disp: 56 tablet, Rfl: 2 .  amLODipine (NORVASC) 5 MG tablet, Take 1 tablet (5 mg total) by mouth daily., Disp: 90 tablet, Rfl: 1 .  apixaban (ELIQUIS) 2.5 MG TABS tablet, Take 1 tablet (2.5 mg total) by mouth 2 (two) times daily., Disp: 60 tablet, Rfl: 3 .  Ascorbic Acid (VITAMIN C) 100 MG tablet, Take 100 mg by mouth daily.  , Disp: , Rfl:  .  Calcium Carbonate-Vitamin D (CALTRATE 600+D PO), Take 1 tablet by mouth daily., Disp: , Rfl:  .  dexamethasone (DECADRON) 4 MG tablet, Take 1 tablet (4 mg total) by mouth See admin instructions. You may take 4 mg daily for 2 days after chemotherapy if needed for nausea (Patient not taking:  Reported on 04/02/2018), Disp: 30 tablet, Rfl: 0 .  diazepam (VALIUM) 10 MG tablet, Take 1 tablet (10 mg total) by mouth at bedtime as needed for anxiety., Disp: 30 tablet, Rfl: 5 .  dronabinol (MARINOL) 5 MG capsule, Take 1 capsule (5 mg total) by mouth 2 (two) times daily before lunch and supper., Disp: 60 capsule, Rfl: 0 .  esomeprazole (NEXIUM) 40 MG capsule, Take 40 mg by mouth daily as needed. , Disp: , Rfl:  .  ferrous sulfate 325 (65 FE) MG EC tablet, Take 1 tablet (325 mg total) by mouth daily., Disp: 30 tablet, Rfl: 3 .  fulvestrant (FASLODEX) 250 MG/5ML injection, Inject 250 mg into the muscle every 14 (fourteen) days. One injection each buttock over 1-2 minutes. Warm prior to use., Disp: , Rfl:  .  furosemide (LASIX) 20 MG tablet, Take 1 tablet (20 mg total) by mouth daily as needed., Disp: 30 tablet, Rfl: 1 .  goserelin (ZOLADEX) 3.6 MG injection, Inject 3.6 mg into the skin every 28 (twenty-eight) days., Disp: , Rfl:  .  lidocaine-prilocaine (EMLA) cream, Apply to affected area once, Disp: 30 g, Rfl: 3 .  loperamide (IMODIUM A-D) 2 MG tablet, Take 2 mg by mouth 4 (four) times daily as needed for diarrhea or loose stools. , Disp: , Rfl:  .  Multiple Vitamin (MULTIVITAMIN) tablet, Take 1 tablet by mouth daily.  , Disp: , Rfl:  .  ondansetron (ZOFRAN) 4 MG tablet, Take 1 tablet (4 mg total) by mouth every 6 (six) hours as needed for nausea or vomiting., Disp: 60 tablet, Rfl: 1 .  oxybutynin (  DITROPAN) 5 MG tablet, Take 1 tablet (5 mg total) by mouth every 8 (eight) hours as needed for bladder spasms., Disp: 20 tablet, Rfl: 0 .  oxyCODONE (OXY IR/ROXICODONE) 5 MG immediate release tablet, Take 1 tablet (5 mg total) by mouth every 6 (six) hours as needed for severe pain., Disp: 30 tablet, Rfl: 0 .  oxyCODONE (OXYCONTIN) 10 mg 12 hr tablet, Take 1 tablet (10 mg total) by mouth every 12 (twelve) hours., Disp: 28 tablet, Rfl: 0 .  potassium chloride SA (K-DUR,KLOR-CON) 20 MEQ tablet, Take 1  tablet (20 mEq total) by mouth daily., Disp: 30 tablet, Rfl: 0 .  prochlorperazine (COMPAZINE) 10 MG tablet, Take 1 tablet (10 mg total) by mouth every 6 (six) hours as needed (Nausea or vomiting)., Disp: 30 tablet, Rfl: 1 .  scopolamine (TRANSDERM-SCOP) 1 MG/3DAYS, Place 1 patch (1.5 mg total) onto the skin every 3 (three) days., Disp: 10 patch, Rfl: 0 .  traZODone (DESYREL) 50 MG tablet, Take 0.5-1 tablets (25-50 mg total) by mouth at bedtime as needed for sleep., Disp: 90 tablet, Rfl: 1  Physical exam: ECOG 2  Physical Exam Constitutional:      General: She is not in acute distress. HENT:     Head: Normocephalic and atraumatic.  Eyes:     General: No scleral icterus.    Pupils: Pupils are equal, round, and reactive to light.  Neck:     Musculoskeletal: Normal range of motion and neck supple.  Cardiovascular:     Rate and Rhythm: Normal rate and regular rhythm.     Heart sounds: Normal heart sounds.  Pulmonary:     Effort: Pulmonary effort is normal. No respiratory distress.     Breath sounds: No wheezing.     Comments: Decreased breath sounds bilaterally Abdominal:     General: Bowel sounds are normal. There is distension.     Palpations: Abdomen is soft. There is no mass.     Tenderness: There is no abdominal tenderness.  Musculoskeletal: Normal range of motion.        General: No deformity.  Skin:    General: Skin is warm and dry.     Findings: No erythema or rash.  Neurological:     General: No focal deficit present.     Mental Status: She is alert and oriented to person, place, and time.     Cranial Nerves: No cranial nerve deficit.     Coordination: Coordination normal.  Psychiatric:        Behavior: Behavior normal.        Thought Content: Thought content normal.     CMP Latest Ref Rng & Units 04/03/2018  Glucose 70 - 99 mg/dL 106(H)  BUN 6 - 20 mg/dL 14  Creatinine 0.44 - 1.00 mg/dL 1.31(H)  Sodium 135 - 145 mmol/L 137  Potassium 3.5 - 5.1 mmol/L 3.7    Chloride 98 - 111 mmol/L 102  CO2 22 - 32 mmol/L 26  Calcium 8.9 - 10.3 mg/dL 9.1  Total Protein 6.5 - 8.1 g/dL -  Total Bilirubin 0.3 - 1.2 mg/dL -  Alkaline Phos 38 - 126 U/L -  AST 15 - 41 U/L -  ALT 0 - 44 U/L -   CBC Latest Ref Rng & Units 04/02/2018  WBC 4.0 - 10.5 K/uL 3.7(L)  Hemoglobin 12.0 - 15.0 g/dL 11.8(L)  Hematocrit 36.0 - 46.0 % 36.4  Platelets 150 - 400 K/uL 285    Ct Chest Wo Contrast  Result Date:  04/04/2018 CLINICAL DATA:  Metastatic breast cancer. Status post bilateral mastectomy. EXAM: CT CHEST, ABDOMEN AND PELVIS WITHOUT CONTRAST TECHNIQUE: Multidetector CT imaging of the chest, abdomen and pelvis was performed following the standard protocol without IV contrast. COMPARISON:  02/06/2018 FINDINGS: CT CHEST FINDINGS Cardiovascular: The heart size is normal. No substantial pericardial effusion. Right Port-A-Cath tip is at the SVC/RA junction. Mediastinum/Nodes: No mediastinal lymphadenopathy. There is no hilar lymphadenopathy. Esophagus appears diffusely fluid-filled. There is no axillary lymphadenopathy. Lungs/Pleura: The central tracheobronchial airways are patent. Stable appearance of apparent scarring in the right apex. Mild atelectasis in the right middle and lower lobes is new in the interval. There is a new small right pleural effusion. No suspicious nodule or mass in the left lung. Minimal atelectasis noted left lower lobe. Tiny left pleural effusion evident. Musculoskeletal: Stable similar diffuse heterogeneous bone mineralization CT ABDOMEN PELVIS FINDINGS Hepatobiliary: No focal abnormality in the liver parenchyma. High attenuation material in the gallbladder lumen may be related to stones or sludge. No intrahepatic or extrahepatic biliary dilation. Pancreas: No focal mass lesion. No dilatation of the main duct. No intraparenchymal cyst. No peripancreatic edema. Spleen: No splenomegaly. No focal mass lesion. Adrenals/Urinary Tract: Similar to the thickening of the  left adrenal gland. Thickening of the right adrenal gland is also similar to prior. Bilateral percutaneous nephrostomy tubes been removed in the interval. There is mild to moderate right hydronephrosis with mild fullness of the left intrarenal collecting system. Neither ureter is distended beyond its midpoint. Urinary bladder is decompressed. Stomach/Bowel: Stomach is nondistended. No gastric wall thickening. No evidence of outlet obstruction. Duodenum is normally positioned as is the ligament of Treitz. No small bowel wall thickening. No small bowel dilatation. The terminal ileum is normal. The appendix is not visualized, but there is no edema or inflammation in the region of the cecum. Colon is diffusely decompressed. Vascular/Lymphatic: No abdominal aortic aneurysm. No retroperitoneal lymphadenopathy. No pelvic sidewall lymphadenopathy. Reproductive: But The uterus is unremarkable. There is no adnexal mass. Other: Moderate to large volume ascites is new in the interval. There is vascular congestion/nodularity in the gastrocolic ligament and omentum suggestion of peritoneal thickening seen in the left pelvis (96/2) and posterior pelvis bilaterally (102/2). Musculoskeletal: Similar appearance of heterogeneous bone mineralization with some more densely sclerotic lesions identified throughout as examples, see L5 vertebral body (84/2) left pubic bone (112/2) and posterior left inferior pubic ramus (118/2). IMPRESSION: 1. Interval development of moderate to large volume ascites with congestion and nodularity in the gastrocolic ligament and omentum. Imaging features highly concerning for metastatic involvement. 2. Interval removal of percutaneous nephrostomy tubes with mild to moderate right hydronephrosis and mild fullness of the left intrarenal collecting system. 3. Diffuse heterogeneous bone mineralization with scattered sclerotic lesion similar to prior and compatible with metastatic disease. 4. Small right and tiny  left pleural effusions. 5. Urinary bladder decompressed on today's study and not well evaluated. Electronically Signed   By: Misty Stanley M.D.   On: 04/04/2018 11:55   US Renal  Result Date: 04/03/2018 CLINICAL DATA:  Acute kidney injury. EXAM: RENAL / URINARY TRACT ULTRASOUND COMPLETE COMPARISON:  CT of the abdomen and pelvis 02/06/2018 FINDINGS: Right Kidney: Renal measurements: 12.7 x 4.7 x 5.7 cm = volume: 171 mL. Moderate to severe recurrent right-sided hydronephrosis is present. No obstructing lesion or mass is present. Moderate renal thinning is present. Left Kidney: Renal measurements: 11.5 x 4.3 x 5.0 cm = volume: 129 mL. Moderate left-sided hydronephrosis is present. Parenchymal thickness is normal.  Bladder: Under distended. Moderate diffuse abdominal ascites is present. Bilateral pleural effusions are noted. IMPRESSION: 1. Recurrent bilateral hydronephrosis, right greater than left. 2. Moderate diffuse abdominal ascites. 3. Bilateral pleural effusions. Electronically Signed   By: San Morelle M.D.   On: 04/03/2018 16:48   Ct Renal Stone Study  Result Date: 04/04/2018 CLINICAL DATA:  Metastatic breast cancer. Status post bilateral mastectomy. EXAM: CT CHEST, ABDOMEN AND PELVIS WITHOUT CONTRAST TECHNIQUE: Multidetector CT imaging of the chest, abdomen and pelvis was performed following the standard protocol without IV contrast. COMPARISON:  02/06/2018 FINDINGS: CT CHEST FINDINGS Cardiovascular: The heart size is normal. No substantial pericardial effusion. Right Port-A-Cath tip is at the SVC/RA junction. Mediastinum/Nodes: No mediastinal lymphadenopathy. There is no hilar lymphadenopathy. Esophagus appears diffusely fluid-filled. There is no axillary lymphadenopathy. Lungs/Pleura: The central tracheobronchial airways are patent. Stable appearance of apparent scarring in the right apex. Mild atelectasis in the right middle and lower lobes is new in the interval. There is a new small right  pleural effusion. No suspicious nodule or mass in the left lung. Minimal atelectasis noted left lower lobe. Tiny left pleural effusion evident. Musculoskeletal: Stable similar diffuse heterogeneous bone mineralization CT ABDOMEN PELVIS FINDINGS Hepatobiliary: No focal abnormality in the liver parenchyma. High attenuation material in the gallbladder lumen may be related to stones or sludge. No intrahepatic or extrahepatic biliary dilation. Pancreas: No focal mass lesion. No dilatation of the main duct. No intraparenchymal cyst. No peripancreatic edema. Spleen: No splenomegaly. No focal mass lesion. Adrenals/Urinary Tract: Similar to the thickening of the left adrenal gland. Thickening of the right adrenal gland is also similar to prior. Bilateral percutaneous nephrostomy tubes been removed in the interval. There is mild to moderate right hydronephrosis with mild fullness of the left intrarenal collecting system. Neither ureter is distended beyond its midpoint. Urinary bladder is decompressed. Stomach/Bowel: Stomach is nondistended. No gastric wall thickening. No evidence of outlet obstruction. Duodenum is normally positioned as is the ligament of Treitz. No small bowel wall thickening. No small bowel dilatation. The terminal ileum is normal. The appendix is not visualized, but there is no edema or inflammation in the region of the cecum. Colon is diffusely decompressed. Vascular/Lymphatic: No abdominal aortic aneurysm. No retroperitoneal lymphadenopathy. No pelvic sidewall lymphadenopathy. Reproductive: But The uterus is unremarkable. There is no adnexal mass. Other: Moderate to large volume ascites is new in the interval. There is vascular congestion/nodularity in the gastrocolic ligament and omentum suggestion of peritoneal thickening seen in the left pelvis (96/2) and posterior pelvis bilaterally (102/2). Musculoskeletal: Similar appearance of heterogeneous bone mineralization with some more densely sclerotic  lesions identified throughout as examples, see L5 vertebral body (84/2) left pubic bone (112/2) and posterior left inferior pubic ramus (118/2). IMPRESSION: 1. Interval development of moderate to large volume ascites with congestion and nodularity in the gastrocolic ligament and omentum. Imaging features highly concerning for metastatic involvement. 2. Interval removal of percutaneous nephrostomy tubes with mild to moderate right hydronephrosis and mild fullness of the left intrarenal collecting system. 3. Diffuse heterogeneous bone mineralization with scattered sclerotic lesion similar to prior and compatible with metastatic disease. 4. Small right and tiny left pleural effusions. 5. Urinary bladder decompressed on today's study and not well evaluated. Electronically Signed   By: Misty Stanley M.D.   On: 04/04/2018 11:55     Assessment and plan Patient is a 58 y.o. female with metastatic breast cancer, visceral crisis present for follow-up for management of cancer related symptoms and recent ED visit. 1.  AKI (acute kidney injury) ([place])   2. Metastatic breast cancer ([facility])   3. Other ascites   4. Leptomeningeal disease   5. 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  Unfortunately cancer progressed while on CDK 4/6 inhibitors.  Visceral crisis.  Plan restart patient on weekly Taxol this week. Lab [cbc,cmp, Taxol 80mg /m2].  Advise patient to hold Abemaciclib.   # Ascites, abdominal distension, plan obtain US diagnostic and therapeutic paracentesis. # AKI, hydronephrosis, due to cancer progression. Discussed with Urology Dr.Brandon.  Plan obtain IR nephrostomy tube on right side.   # Leptomeningeal disease with neurological symptoms Appointment with Dr.Jaikuma next week for Ommaya reservoir placement.   # Refer to home  health care   #History of distal lower extremity DVT, finished 3 months of full anticoagulation, on low dose Eliquis 2.5mg  BID.  Advise patient to stop Eliquis 2 days prior to invasive procedure.  Total face to face encounter time for this patient visit was 25 min. >50% of the time was  spent in counseling and coordination of care.   Earlie Server, MD, PhD Hematology Oncology [facility] Pager- [phone] 04/04/2018

## 2018-04-04 NOTE — Telephone Encounter (Signed)
Contacted by Dr. Tasia Catchings, most recent CT scan that showed progression of her disease, right sided hydronephrosis recurrent and severe, minimal left-sided hydro-.  Her creatinine is risen to 1.3.  Imaging was reviewed.  I recommend a replacement of her right-sided percutaneous nephrostomy tube which will be facilitated by Dr. Tasia Catchings.  I would like to see her back in the office in 2 to 3 weeks with a renal ultrasound prior to reassess her left side.  Order placed.  Hollice Espy, MD

## 2018-04-04 NOTE — Progress Notes (Signed)
Open in error

## 2018-04-04 NOTE — Addendum Note (Signed)
Addended by: Earlie Server on: 04/04/2018 09:27 AM   Modules accepted: Orders

## 2018-04-05 ENCOUNTER — Other Ambulatory Visit: Payer: Self-pay | Admitting: Physician Assistant

## 2018-04-05 ENCOUNTER — Inpatient Hospital Stay: Payer: 59

## 2018-04-05 ENCOUNTER — Encounter: Payer: Self-pay | Admitting: Oncology

## 2018-04-05 VITALS — BP 135/92 | HR 92 | Temp 96.9°F | Resp 18

## 2018-04-05 DIAGNOSIS — Z9013 Acquired absence of bilateral breasts and nipples: Secondary | ICD-10-CM | POA: Diagnosis not present

## 2018-04-05 DIAGNOSIS — C50412 Malignant neoplasm of upper-outer quadrant of left female breast: Secondary | ICD-10-CM

## 2018-04-05 DIAGNOSIS — Z170 Estrogen receptor positive status [ER+]: Secondary | ICD-10-CM | POA: Diagnosis not present

## 2018-04-05 DIAGNOSIS — C50919 Malignant neoplasm of unspecified site of unspecified female breast: Secondary | ICD-10-CM

## 2018-04-05 DIAGNOSIS — Z923 Personal history of irradiation: Secondary | ICD-10-CM | POA: Diagnosis not present

## 2018-04-05 DIAGNOSIS — D6481 Anemia due to antineoplastic chemotherapy: Secondary | ICD-10-CM | POA: Diagnosis not present

## 2018-04-05 DIAGNOSIS — C50411 Malignant neoplasm of upper-outer quadrant of right female breast: Secondary | ICD-10-CM | POA: Diagnosis not present

## 2018-04-05 DIAGNOSIS — Z9221 Personal history of antineoplastic chemotherapy: Secondary | ICD-10-CM | POA: Diagnosis not present

## 2018-04-05 DIAGNOSIS — C7949 Secondary malignant neoplasm of other parts of nervous system: Secondary | ICD-10-CM | POA: Diagnosis not present

## 2018-04-05 DIAGNOSIS — Z79818 Long term (current) use of other agents affecting estrogen receptors and estrogen levels: Secondary | ICD-10-CM | POA: Diagnosis not present

## 2018-04-05 DIAGNOSIS — Z5111 Encounter for antineoplastic chemotherapy: Secondary | ICD-10-CM | POA: Diagnosis not present

## 2018-04-05 LAB — CBC WITH DIFFERENTIAL/PLATELET
Abs Immature Granulocytes: 0.03 10*3/uL (ref 0.00–0.07)
Basophils Absolute: 0 10*3/uL (ref 0.0–0.1)
Basophils Relative: 1 %
Eosinophils Absolute: 0 10*3/uL (ref 0.0–0.5)
Eosinophils Relative: 0 %
HCT: 35.1 % — ABNORMAL LOW (ref 36.0–46.0)
Hemoglobin: 11.3 g/dL — ABNORMAL LOW (ref 12.0–15.0)
Immature Granulocytes: 1 %
Lymphocytes Relative: 16 %
Lymphs Abs: 0.6 10*3/uL — ABNORMAL LOW (ref 0.7–4.0)
MCH: 29.8 pg (ref 26.0–34.0)
MCHC: 32.2 g/dL (ref 30.0–36.0)
MCV: 92.6 fL (ref 80.0–100.0)
Monocytes Absolute: 0.1 10*3/uL (ref 0.1–1.0)
Monocytes Relative: 4 %
NEUTROS PCT: 78 %
Neutro Abs: 2.7 10*3/uL (ref 1.7–7.7)
Platelets: 293 10*3/uL (ref 150–400)
RBC: 3.79 MIL/uL — AB (ref 3.87–5.11)
RDW: 17.9 % — AB (ref 11.5–15.5)
WBC: 3.5 10*3/uL — ABNORMAL LOW (ref 4.0–10.5)
nRBC: 0 % (ref 0.0–0.2)

## 2018-04-05 LAB — COMPREHENSIVE METABOLIC PANEL
ALT: 28 U/L (ref 0–44)
AST: 36 U/L (ref 15–41)
Albumin: 3 g/dL — ABNORMAL LOW (ref 3.5–5.0)
Alkaline Phosphatase: 107 U/L (ref 38–126)
Anion gap: 9 (ref 5–15)
BUN: 13 mg/dL (ref 6–20)
CO2: 23 mmol/L (ref 22–32)
Calcium: 8.4 mg/dL — ABNORMAL LOW (ref 8.9–10.3)
Chloride: 104 mmol/L (ref 98–111)
Creatinine, Ser: 1.44 mg/dL — ABNORMAL HIGH (ref 0.44–1.00)
GFR calc non Af Amer: 40 mL/min — ABNORMAL LOW (ref >60)
GFR, EST AFRICAN AMERICAN: 47 mL/min — AB (ref >60)
Glucose, Bld: 132 mg/dL — ABNORMAL HIGH (ref 70–99)
POTASSIUM: 3.5 mmol/L (ref 3.5–5.1)
SODIUM: 136 mmol/L (ref 135–145)
Total Bilirubin: 0.4 mg/dL (ref 0.3–1.2)
Total Protein: 6.6 g/dL (ref 6.5–8.1)

## 2018-04-05 MED ORDER — DIPHENHYDRAMINE HCL 50 MG/ML IJ SOLN
50.0000 mg | Freq: Once | INTRAMUSCULAR | Status: AC
  Administered 2018-04-05: 50 mg via INTRAVENOUS
  Filled 2018-04-05: qty 1

## 2018-04-05 MED ORDER — HEPARIN SOD (PORK) LOCK FLUSH 100 UNIT/ML IV SOLN
500.0000 [IU] | Freq: Once | INTRAVENOUS | Status: AC
  Administered 2018-04-05: 500 [IU] via INTRAVENOUS

## 2018-04-05 MED ORDER — SODIUM CHLORIDE 0.9% FLUSH
10.0000 mL | INTRAVENOUS | Status: DC | PRN
  Administered 2018-04-05: 10 mL via INTRAVENOUS
  Filled 2018-04-05: qty 10

## 2018-04-05 MED ORDER — FAMOTIDINE IN NACL 20-0.9 MG/50ML-% IV SOLN
20.0000 mg | Freq: Once | INTRAVENOUS | Status: AC
  Administered 2018-04-05: 20 mg via INTRAVENOUS
  Filled 2018-04-05: qty 50

## 2018-04-05 MED ORDER — SODIUM CHLORIDE 0.9 % IV SOLN
80.0000 mg/m2 | Freq: Once | INTRAVENOUS | Status: AC
  Administered 2018-04-05: 144 mg via INTRAVENOUS
  Filled 2018-04-05: qty 24

## 2018-04-05 MED ORDER — DEXAMETHASONE SODIUM PHOSPHATE 10 MG/ML IJ SOLN
10.0000 mg | Freq: Once | INTRAMUSCULAR | Status: AC
  Administered 2018-04-05: 10 mg via INTRAVENOUS
  Filled 2018-04-05: qty 1

## 2018-04-05 MED ORDER — SODIUM CHLORIDE 0.9 % IV SOLN
Freq: Once | INTRAVENOUS | Status: AC
  Administered 2018-04-05: 12:00:00 via INTRAVENOUS
  Filled 2018-04-05: qty 250

## 2018-04-05 NOTE — Telephone Encounter (Signed)
App made will contact patient  Mary [name]

## 2018-04-05 NOTE — Progress Notes (Addendum)
Infusion Reaction Progress Note  Date: 04/05/18  Time: 02:15 pm  Pre-medications given: Decadron   Medication given: Taxol   Reaction Type: Near Syncope while trying to go to the bathroom  Hx of reaction: Husband states this is not the first time she has "almost fallen". Patient has become very weak over the past few weeks and is having trouble getting around, requiring more assistance.   Reaction medication: None. Infusion was complete at time of incidence.  Re-challenge: N/A. Tolerated Taxol in the past. Last given on 02/02/18.   Assessment  Patient BP ok. (In chart). She was not orthostatic.   Physical Exam unremarkable.   Labs ok.   Dr. Tasia Catchings notified.   Physical Exam  Constitutional: She is oriented to person, place, and time and well-developed, well-nourished, and in no distress. Vital signs are normal.  HENT:  Head: Normocephalic and atraumatic.  Eyes: Pupils are equal, round, and reactive to light.  Neck: Normal range of motion.  Cardiovascular: Normal rate, regular rhythm and normal heart sounds.  No murmur heard. Pulmonary/Chest: Effort normal and breath sounds normal. She has no wheezes.  Abdominal: Soft. Normal appearance and bowel sounds are normal. She exhibits no distension. There is no abdominal tenderness.  Musculoskeletal: Normal range of motion.        General: No edema.  Neurological: She is alert and oriented to person, place, and time. Gait normal.  Skin: Skin is warm and dry. No rash noted.  Psychiatric: Mood, memory, affect and judgment normal.    Faythe Casa, NP 04/05/2018 2:41 PM

## 2018-04-06 ENCOUNTER — Ambulatory Visit
Admission: RE | Admit: 2018-04-06 | Discharge: 2018-04-06 | Disposition: A | Discharge status: Home / Self Care | Payer: 59 | Source: Ambulatory Visit | Attending: Oncology | Admitting: Oncology

## 2018-04-06 ENCOUNTER — Inpatient Hospital Stay: Payer: 59

## 2018-04-06 ENCOUNTER — Other Ambulatory Visit: Payer: Self-pay | Admitting: Oncology

## 2018-04-06 ENCOUNTER — Other Ambulatory Visit: Payer: Self-pay

## 2018-04-06 ENCOUNTER — Observation Stay
Admission: EM | Admit: 2018-04-06 | Discharge: 2018-04-07 | Disposition: A | Discharge status: Home / Self Care | Payer: 59 | Attending: Internal Medicine | Admitting: Internal Medicine

## 2018-04-06 ENCOUNTER — Encounter: Payer: Self-pay | Admitting: Emergency Medicine

## 2018-04-06 ENCOUNTER — Ambulatory Visit: Payer: 59

## 2018-04-06 DIAGNOSIS — K219 Gastro-esophageal reflux disease without esophagitis: Secondary | ICD-10-CM | POA: Diagnosis not present

## 2018-04-06 DIAGNOSIS — C50911 Malignant neoplasm of unspecified site of right female breast: Secondary | ICD-10-CM | POA: Diagnosis not present

## 2018-04-06 DIAGNOSIS — Z9013 Acquired absence of bilateral breasts and nipples: Secondary | ICD-10-CM | POA: Insufficient documentation

## 2018-04-06 DIAGNOSIS — Z9889 Other specified postprocedural states: Secondary | ICD-10-CM | POA: Diagnosis not present

## 2018-04-06 DIAGNOSIS — Z936 Other artificial openings of urinary tract status: Secondary | ICD-10-CM | POA: Insufficient documentation

## 2018-04-06 DIAGNOSIS — Z853 Personal history of malignant neoplasm of breast: Secondary | ICD-10-CM | POA: Diagnosis not present

## 2018-04-06 DIAGNOSIS — Z79891 Long term (current) use of opiate analgesic: Secondary | ICD-10-CM | POA: Diagnosis not present

## 2018-04-06 DIAGNOSIS — Z66 Do not resuscitate: Secondary | ICD-10-CM | POA: Diagnosis not present

## 2018-04-06 DIAGNOSIS — N183 Chronic kidney disease, stage 3 (moderate): Secondary | ICD-10-CM | POA: Insufficient documentation

## 2018-04-06 DIAGNOSIS — Z8041 Family history of malignant neoplasm of ovary: Secondary | ICD-10-CM | POA: Insufficient documentation

## 2018-04-06 DIAGNOSIS — F329 Major depressive disorder, single episode, unspecified: Secondary | ICD-10-CM | POA: Insufficient documentation

## 2018-04-06 DIAGNOSIS — R188 Other ascites: Secondary | ICD-10-CM | POA: Diagnosis not present

## 2018-04-06 DIAGNOSIS — C50919 Malignant neoplasm of unspecified site of unspecified female breast: Secondary | ICD-10-CM

## 2018-04-06 DIAGNOSIS — Z803 Family history of malignant neoplasm of breast: Secondary | ICD-10-CM | POA: Insufficient documentation

## 2018-04-06 DIAGNOSIS — Z9221 Personal history of antineoplastic chemotherapy: Secondary | ICD-10-CM | POA: Insufficient documentation

## 2018-04-06 DIAGNOSIS — F411 Generalized anxiety disorder: Secondary | ICD-10-CM | POA: Diagnosis not present

## 2018-04-06 DIAGNOSIS — N179 Acute kidney failure, unspecified: Secondary | ICD-10-CM

## 2018-04-06 DIAGNOSIS — Z87891 Personal history of nicotine dependence: Secondary | ICD-10-CM | POA: Insufficient documentation

## 2018-04-06 DIAGNOSIS — Z888 Allergy status to other drugs, medicaments and biological substances status: Secondary | ICD-10-CM | POA: Insufficient documentation

## 2018-04-06 DIAGNOSIS — Z923 Personal history of irradiation: Secondary | ICD-10-CM | POA: Insufficient documentation

## 2018-04-06 DIAGNOSIS — R531 Weakness: Secondary | ICD-10-CM | POA: Diagnosis not present

## 2018-04-06 DIAGNOSIS — Z79899 Other long term (current) drug therapy: Secondary | ICD-10-CM | POA: Insufficient documentation

## 2018-04-06 DIAGNOSIS — Z800 Family history of malignant neoplasm of digestive organs: Secondary | ICD-10-CM | POA: Diagnosis not present

## 2018-04-06 DIAGNOSIS — M6281 Muscle weakness (generalized): Principal | ICD-10-CM | POA: Insufficient documentation

## 2018-04-06 DIAGNOSIS — N1330 Unspecified hydronephrosis: Secondary | ICD-10-CM | POA: Diagnosis not present

## 2018-04-06 DIAGNOSIS — K589 Irritable bowel syndrome without diarrhea: Secondary | ICD-10-CM | POA: Insufficient documentation

## 2018-04-06 DIAGNOSIS — R180 Malignant ascites: Secondary | ICD-10-CM | POA: Diagnosis not present

## 2018-04-06 DIAGNOSIS — Z7901 Long term (current) use of anticoagulants: Secondary | ICD-10-CM | POA: Diagnosis not present

## 2018-04-06 DIAGNOSIS — R2681 Unsteadiness on feet: Secondary | ICD-10-CM | POA: Diagnosis not present

## 2018-04-06 DIAGNOSIS — R42 Dizziness and giddiness: Secondary | ICD-10-CM | POA: Diagnosis not present

## 2018-04-06 HISTORY — PX: IR NEPHROSTOMY PLACEMENT RIGHT: IMG6064

## 2018-04-06 LAB — BASIC METABOLIC PANEL
Anion gap: 7 (ref 5–15)
BUN: 11 mg/dL (ref 6–20)
CHLORIDE: 102 mmol/L (ref 98–111)
CO2: 25 mmol/L (ref 22–32)
Calcium: 7.5 mg/dL — ABNORMAL LOW (ref 8.9–10.3)
Creatinine, Ser: 1.14 mg/dL — ABNORMAL HIGH (ref 0.44–1.00)
GFR calc Af Amer: >60 mL/min (ref >60)
GFR calc non Af Amer: 54 mL/min — ABNORMAL LOW (ref >60)
Glucose, Bld: 159 mg/dL — ABNORMAL HIGH (ref 70–99)
Potassium: 3.1 mmol/L — ABNORMAL LOW (ref 3.5–5.1)
Sodium: 134 mmol/L — ABNORMAL LOW (ref 135–145)

## 2018-04-06 LAB — LACTATE DEHYDROGENASE, PLEURAL OR PERITONEAL FLUID: LD, Fluid: 278 U/L — ABNORMAL HIGH (ref 3–23)

## 2018-04-06 LAB — BODY FLUID CELL COUNT WITH DIFFERENTIAL
Eos, Fluid: 0 %
Lymphs, Fluid: 19 %
Monocyte-Macrophage-Serous Fluid: 81 %
Neutrophil Count, Fluid: 0 %
Other Cells, Fluid: 0 %
Total Nucleated Cell Count, Fluid: 881 cu mm

## 2018-04-06 LAB — PROTIME-INR
INR: 0.95
Prothrombin Time: 12.6 seconds (ref 11.4–15.2)

## 2018-04-06 LAB — ALBUMIN, PLEURAL OR PERITONEAL FLUID: Albumin, Fluid: 2 g/dL

## 2018-04-06 LAB — MAGNESIUM: MAGNESIUM: 2 mg/dL (ref 1.7–2.4)

## 2018-04-06 MED ORDER — DIPHENHYDRAMINE HCL 50 MG/ML IJ SOLN
25.0000 mg | Freq: Once | INTRAMUSCULAR | Status: AC
  Administered 2018-04-06: 25 mg via INTRAVENOUS

## 2018-04-06 MED ORDER — MIDAZOLAM HCL 5 MG/5ML IJ SOLN
INTRAMUSCULAR | Status: AC
  Filled 2018-04-06: qty 5

## 2018-04-06 MED ORDER — PROCHLORPERAZINE MALEATE 10 MG PO TABS
10.0000 mg | ORAL_TABLET | Freq: Four times a day (QID) | ORAL | Status: DC | PRN
  Filled 2018-04-06: qty 1

## 2018-04-06 MED ORDER — MIDAZOLAM HCL 5 MG/5ML IJ SOLN
INTRAMUSCULAR | Status: AC | PRN
  Administered 2018-04-06 (×2): 1 mg via INTRAVENOUS

## 2018-04-06 MED ORDER — LIDOCAINE HCL (PF) 1 % IJ SOLN
INTRAMUSCULAR | Status: AC
  Filled 2018-04-06: qty 30

## 2018-04-06 MED ORDER — ALBUMIN HUMAN 25 % IV SOLN
INTRAVENOUS | Status: AC
  Administered 2018-04-06: 25 g via INTRAVENOUS
  Filled 2018-04-06: qty 100

## 2018-04-06 MED ORDER — OXYCODONE HCL ER 10 MG PO T12A
10.0000 mg | EXTENDED_RELEASE_TABLET | Freq: Two times a day (BID) | ORAL | Status: DC
  Administered 2018-04-07: 10 mg via ORAL
  Filled 2018-04-06: qty 1

## 2018-04-06 MED ORDER — ALBUTEROL SULFATE (2.5 MG/3ML) 0.083% IN NEBU: 2.5000 mg | INHALATION_SOLUTION | RESPIRATORY_TRACT | Status: DC | PRN

## 2018-04-06 MED ORDER — SODIUM CHLORIDE 0.9% FLUSH
3.0000 mL | Freq: Two times a day (BID) | INTRAVENOUS | Status: DC
  Administered 2018-04-06: 21:00:00 3 mL via INTRAVENOUS

## 2018-04-06 MED ORDER — ALBUMIN HUMAN 25 % IV SOLN: 30.0000 g | Freq: Once | INTRAVENOUS | Status: DC

## 2018-04-06 MED ORDER — LIDOCAINE HCL (PF) 1 % IJ SOLN
INTRAMUSCULAR | Status: AC | PRN
  Administered 2018-04-06: 8 mL

## 2018-04-06 MED ORDER — ACETAMINOPHEN 650 MG RE SUPP: 650.0000 mg | Freq: Four times a day (QID) | RECTAL | Status: DC | PRN

## 2018-04-06 MED ORDER — DIAZEPAM 5 MG PO TABS
10.0000 mg | ORAL_TABLET | Freq: Every evening | ORAL | Status: DC | PRN
  Administered 2018-04-06: 22:00:00 10 mg via ORAL
  Filled 2018-04-06: qty 2

## 2018-04-06 MED ORDER — SODIUM CHLORIDE 0.9 % IV SOLN: 250.0000 mL | INTRAVENOUS | Status: DC | PRN

## 2018-04-06 MED ORDER — APIXABAN 2.5 MG PO TABS
2.5000 mg | ORAL_TABLET | Freq: Two times a day (BID) | ORAL | Status: DC
  Administered 2018-04-07: 2.5 mg via ORAL
  Filled 2018-04-06 (×2): qty 1

## 2018-04-06 MED ORDER — PANTOPRAZOLE SODIUM 40 MG PO TBEC
40.0000 mg | DELAYED_RELEASE_TABLET | Freq: Every day | ORAL | Status: DC
  Administered 2018-04-06 – 2018-04-07 (×2): 40 mg via ORAL
  Filled 2018-04-06 (×2): qty 1

## 2018-04-06 MED ORDER — DIPHENHYDRAMINE HCL 50 MG/ML IJ SOLN
INTRAMUSCULAR | Status: AC
  Filled 2018-04-06: qty 1

## 2018-04-06 MED ORDER — ONDANSETRON HCL 4 MG PO TABS: 4.0000 mg | ORAL_TABLET | Freq: Four times a day (QID) | ORAL | Status: DC | PRN

## 2018-04-06 MED ORDER — ACETAMINOPHEN 325 MG PO TABS
650.0000 mg | ORAL_TABLET | Freq: Four times a day (QID) | ORAL | Status: DC | PRN
  Administered 2018-04-06: 650 mg via ORAL
  Filled 2018-04-06: qty 2

## 2018-04-06 MED ORDER — LOPERAMIDE HCL 2 MG PO CAPS: 2.0000 mg | ORAL_CAPSULE | Freq: Four times a day (QID) | ORAL | Status: DC | PRN

## 2018-04-06 MED ORDER — SODIUM CHLORIDE 0.9 % IV SOLN: INTRAVENOUS | Status: DC

## 2018-04-06 MED ORDER — DIPHENHYDRAMINE HCL 25 MG PO CAPS
25.0000 mg | ORAL_CAPSULE | Freq: Once | ORAL | Status: AC
  Administered 2018-04-06: 25 mg via ORAL

## 2018-04-06 MED ORDER — CIPROFLOXACIN IN D5W 400 MG/200ML IV SOLN
400.0000 mg | Freq: Once | INTRAVENOUS | Status: AC
  Administered 2018-04-06: 400 mg via INTRAVENOUS

## 2018-04-06 MED ORDER — FENTANYL CITRATE (PF) 100 MCG/2ML IJ SOLN
INTRAMUSCULAR | Status: AC | PRN
  Administered 2018-04-06: 50 ug via INTRAVENOUS

## 2018-04-06 MED ORDER — DIPHENHYDRAMINE HCL 25 MG PO CAPS
ORAL_CAPSULE | ORAL | Status: AC
  Administered 2018-04-06: 25 mg via ORAL
  Filled 2018-04-06: qty 1

## 2018-04-06 MED ORDER — ALBUMIN HUMAN 25 % IV SOLN
INTRAVENOUS | Status: AC
  Filled 2018-04-06: qty 100

## 2018-04-06 MED ORDER — SODIUM CHLORIDE 0.9% FLUSH: 3.0000 mL | INTRAVENOUS | Status: DC | PRN

## 2018-04-06 MED ORDER — OXYBUTYNIN CHLORIDE 5 MG PO TABS: 5.0000 mg | ORAL_TABLET | Freq: Three times a day (TID) | ORAL | Status: DC | PRN

## 2018-04-06 MED ORDER — AMLODIPINE BESYLATE 5 MG PO TABS
5.0000 mg | ORAL_TABLET | Freq: Every day | ORAL | Status: DC
  Administered 2018-04-07: 5 mg via ORAL
  Filled 2018-04-06: qty 1

## 2018-04-06 MED ORDER — ONDANSETRON HCL 4 MG/2ML IJ SOLN: 4.0000 mg | Freq: Four times a day (QID) | INTRAMUSCULAR | Status: DC | PRN

## 2018-04-06 MED ORDER — ACETAMINOPHEN 325 MG PO TABS
650.0000 mg | ORAL_TABLET | Freq: Once | ORAL | Status: AC
  Administered 2018-04-06: 650 mg via ORAL

## 2018-04-06 MED ORDER — HYDROCODONE-ACETAMINOPHEN 5-325 MG PO TABS
1.0000 | ORAL_TABLET | ORAL | Status: DC | PRN
  Administered 2018-04-07: 01:00:00 1 via ORAL
  Filled 2018-04-06: qty 1

## 2018-04-06 MED ORDER — ALBUMIN HUMAN 25 % IV SOLN
25.0000 g | Freq: Once | INTRAVENOUS | Status: AC
  Administered 2018-04-06: 25 g via INTRAVENOUS

## 2018-04-06 MED ORDER — FENTANYL CITRATE (PF) 100 MCG/2ML IJ SOLN
INTRAMUSCULAR | Status: AC
  Filled 2018-04-06: qty 4

## 2018-04-06 MED ORDER — ACETAMINOPHEN 325 MG PO TABS
ORAL_TABLET | ORAL | Status: AC
  Administered 2018-04-06: 650 mg via ORAL
  Filled 2018-04-06: qty 2

## 2018-04-06 MED ORDER — VITAMIN C 500 MG PO TABS
250.0000 mg | ORAL_TABLET | Freq: Every day | ORAL | Status: DC
  Administered 2018-04-06 – 2018-04-07 (×2): 250 mg via ORAL
  Filled 2018-04-06 (×2): qty 1

## 2018-04-06 MED ORDER — SENNOSIDES-DOCUSATE SODIUM 8.6-50 MG PO TABS: 1.0000 | ORAL_TABLET | Freq: Every evening | ORAL | Status: DC | PRN

## 2018-04-06 MED ORDER — SCOPOLAMINE 1 MG/3DAYS TD PT72
1.0000 | MEDICATED_PATCH | TRANSDERMAL | Status: DC
  Administered 2018-04-06: 1.5 mg via TRANSDERMAL
  Filled 2018-04-06: qty 1

## 2018-04-06 MED ORDER — OXYCODONE HCL 5 MG PO TABS: 5.0000 mg | ORAL_TABLET | Freq: Four times a day (QID) | ORAL | Status: DC | PRN

## 2018-04-06 MED ORDER — DRONABINOL 2.5 MG PO CAPS: 5.0000 mg | ORAL_CAPSULE | Freq: Two times a day (BID) | ORAL | Status: DC

## 2018-04-06 MED ORDER — IOPAMIDOL (ISOVUE-300) INJECTION 61%
50.0000 mL | Freq: Once | INTRAVENOUS | Status: AC | PRN
  Administered 2018-04-06: 10 mL via INTRAVENOUS

## 2018-04-06 MED ORDER — CIPROFLOXACIN IN D5W 400 MG/200ML IV SOLN
INTRAVENOUS | Status: AC
  Filled 2018-04-06: qty 200

## 2018-04-06 MED ORDER — BISACODYL 5 MG PO TBEC
5.0000 mg | DELAYED_RELEASE_TABLET | Freq: Every day | ORAL | Status: DC | PRN
  Administered 2018-04-07: 5 mg via ORAL
  Filled 2018-04-06: qty 1

## 2018-04-06 MED ORDER — TRAZODONE HCL 50 MG PO TABS: 25.0000 mg | ORAL_TABLET | Freq: Every evening | ORAL | Status: DC | PRN

## 2018-04-06 NOTE — Discharge Instructions (Signed)
Percutaneous Nephrostomy Home Guide  Percutaneous nephrostomy is a procedure to insert a flexible tube into your kidney so that urine can leave your body. This procedure may be done if a medical condition prevents urine from leaving your kidney in the usual way. During the procedure, the nephrostomy tube is inserted in the right or left side of your lower back and is connected to an external drainage bag.  After you have a nephrostomy tube placed, urine will collect in the drainage bag outside of your body. You will need to empty and change the drainage bag as needed. You will also need to take steps to care for the area where the nephrostomy tube was inserted (tube insertion site).  How do I care for my nephrostomy tube?   Always keep your tubing, the leg bag, or the bedside drainage bag below the level of your kidney so that your urine drains freely.   Avoid activities that would cause bending or pulling of your tubing. Ask your health care provider what activities are safe for you.   When connecting your nephrostomy tube to a drainage bag, make sure that there are no kinks in the tubing and that your urine is draining freely. You may want to gently wrap an elastic bandage over the tubing. This will help keep the tubing in place and prevent it from kinking. Make sure there is no tension on the tubing so it does not become dislodged.   At night, you may want to connect your nephrostomy tube or the leg bag to a larger bedside drainage bag.  How do I empty the drainage bag?  Empty the leg bag or bedside drainage bag whenever it becomes ? full. Also empty it before you go to sleep. Most drainage bags have a drain at the bottom that allows urine to be emptied. Follow these basic steps:  1. Hold the drainage bag over a toilet or collection container. Use a measuring container if your health care provider told you to measure your urine.  2. Open the drain of the bag and allow the urine to drain out.  3. After all the  urine has drained from the drainage bag, close the drain fully.  4. Flush the urine down the toilet. If a collection container was used, rinse the container.  How do I change the dressing around the nephrostomy tube?  Change your dressing and clean your tube exit site as told by your health care provider. You may need to change the dressing every day for the first 2 weeks after having a nephrostomy tube inserted. After the first 2 weeks, you may be told to change the dressing two times a week.  Supplies needed:   Mild soap and water.   Split gauze pads, 4  4 inches (10 x 10 cm).   Gauze pads, 4  4 inches (10 x 10 cm).   Paper tape.  How to change the dressing:  Because of the location of your nephrostomy tube, you may need help from another person to complete dressing changes. Follow these basic steps:  1. Wash hands with soap and water.  2. Gently remove the tape and dressing from around the nephrostomy tube. Be careful not to pull on the tube while removing the dressing. Avoid using scissors because they may damage the tube.  3. Wash the skin around the tube with mild soap and water, rinse well, and pat the skin dry with a clean cloth.  4. Check the skin   around the drain for redness, swelling, pus, warmth, or a bad smell.  5. If the drain was sutured to the skin, check the suture to verify that it is still anchored in the skin.  6. Place two split gauze pads in and around the tube exit site. Do not apply ointments or alcohol to the site.  7. Place a gauze pad on top of the split gauze pad.  8. Coil the tube on top of the gauze. The tubing should rest on the gauze, not on the skin.  9. Place tape around each edge of the gauze pad.  10. Secure the nephrostomy tubing. Make sure that the tube does not kink or become pinched. The tubing should rest on the gauze pad, not on the skin.  11. Dispose of used supplies properly.    How do I flush my nephrostomy tube?  Use a saline syringe to rinse out (flush) your  nephrostomy tube as told by your health care provider. Flushing is easier if a three-way stopcock is placed between the tube and the drainage bag. One connection of the stopcock connects to your tube, the second connects to the drainage bag, and the third is usually covered with a cap. The lever on the stopcock points to the direction on the stopcock that is closed to flow. Normally, the lever points in the direction of the cap to allow urine to drain from the tube to the drainage bag.  Supplies needed:   Rubbing alcohol wipe.   10 mL 0.9% saline syringe.  How to flush the tube:  1. Move the lever of the three-way stopcock so it points toward the drainage bag.  2. Clean the cap with a rubbing alcohol wipe.  3. Screw the tip of a 10 mL 0.9% saline syringe onto the cap.  4. Using the syringe plunger, slowly push the 10 mL 0.9% saline in the syringe over 5-10 seconds. If resistance is met or pain occurs while pushing, stop pushing the saline.  5. Remove the syringe from the cap.  6. Return the stopcock lever to the usual position, pointing in the direction of the cap.  7. Dispose of used supplies properly.  How do I replace the drainage bag?  Replace the drainage bag, three-way stopcock, and any extension tubing as told by your health care provider. Make sure you always have an extra drainage bag and connecting tubing available.  1. Empty urine from your drainage bag.  2. Gather a new drainage bag, three-way stopcock, and any extension tubing.  3. Remove the drainage bag, three-way stopcock, and any extension tubing from the nephrostomy tube.  4. Attach the new leg bag or bedside drainage bag, three-way stopcock, and any extension tubing to the nephrostomy tube.  5. Dispose of the used drainage bag, three-way stopcock, and any extension.  Contact a health care provider if:   You have problems with any of the valves or tubing.   You have persistent pain or soreness in your back.   You have more redness, swelling,  or pain around your tube insertion site.   You have more fluid or blood coming from your tube insertion site.   Your tube insertion site feels warm to the touch.   You have pus or a bad smell coming from your tube insertion site.   You have increased urine output or you feel burning when urinating.  Get help right away if:   You have pain in your abdomen during the first   week.   You have chest pain or have trouble breathing.   You have a new appearance of blood in your urine.   You have a fever or chills.   You have back pain that is not relieved by your medicine.   You have decreased urine output.   Your nephrostomy tube comes out.  This information is not intended to replace advice given to you by your health care provider. Make sure you discuss any questions you have with your health care provider.  Document Released: 12/26/2012 Document Revised: 12/18/2015 Document Reviewed: 12/18/2015  [employer]vier Interactive Patient Education  2019 [employer]

## 2018-04-06 NOTE — ED Notes (Signed)
Pt resting in bed, placed on bed pan, urinated clear yellow urine. No distress, breathing normal, no rash or hives noted. Husband at bedside.

## 2018-04-06 NOTE — Progress Notes (Signed)
Advanced Care Plan.  Purpose of Encounter: CODE STATUS. Parties in Attendance: The patient, her husband, her mother, her friend and me. Patient's Decisional Capacity: Yes. Medical Story: Mary [name]  is a 58 y.o. female with a known history of breast cancer with metastasis, right-sided hydro-nephrosis, CKD stage III, ascites, IBS, anxiety and depression. The patient got nephrostomy tube and paracentesis this morning.  She is admitted for generalized weakness and dizziness.  I discussed with the patient about her current condition, prognosis and CODE STATUS.  She does not want to be resuscitated or intubated even if she has cardiopulmonary arrest. Goals of Care Determinations: Palliative care. Plan:  Code Status: DNR. Time spent discussing advance care planning: 17 minutes.

## 2018-04-06 NOTE — H&P (Signed)
[facility] [place] NAME: Mary [name]    MR#:  [id number]  DATE OF BIRTH:  02/24/1961  DATE OF ADMISSION:  04/06/2018  PRIMARY CARE PHYSICIAN: [name], MD   REQUESTING/REFERRING PHYSICIAN: Dr. Rip Harbour.  CHIEF COMPLAINT:   Chief Complaint  Patient presents with  . Allergic Reaction   Generalized weakness and dizziness. HISTORY OF PRESENT ILLNESS:  Mary [name]  is a 58 y.o. female with a known history of breast cancer with metastasis, right-sided hydro-nephrosis, CKD stage III, ascites, IBS, anxiety and depression.  The patient is sent to ED due to generalized weakness and dizziness by Dr. Tasia Catchings oncologist.  The patient got nephrostomy tube and paracentesis this morning.  She had allergy to albumin which resolved.  She complains of generalized weakness worsening for 3 months.  She also complains of dizziness for one year.  Her husband said that he cannot take care of her at home.  Dr. Rip Harbour request admission due to above. PAST MEDICAL HISTORY:   Past Medical History:  Diagnosis Date  . Anxiety    takes Xanax prn  . Breast cancer [facility]) August 2013   bilateral, er/pr+, bladder (02/2018..mets from breast ca)  . Chronic kidney disease 01/2018   recent shut down of kidneys with labs out of whack  . Complication of anesthesia    pt states she had the shakes extremely bad  . Cough    at night d/t reflux;nothing productive  . Dental crowns present    x 4  . Depression   . GERD (gastroesophageal reflux disease)    takes Nexium daily  . History of bronchitis    08/2011  . History of colon polyps   . Hot flashes, menopausal 08/08/2012  . IBS (irritable bowel syndrome)    immodium prn  . Insomnia    takes Ambien nightly  . Irritable bowel syndrome   . Neutropenia, drug-induced ([place]) 12/15/2011  . Nocturia   . Plantar fasciitis   . S/P radiation therapy 07/03/2012 through 08/18/2038                                                         Right chest wall/regional lymph nodes 5040 cGy 28 sessions, right chest wall/mastectomy scar boost 1000 cGy 5 sessions                          . Status post chemotherapy    FEC 100 starting on 12/08/2011 - 01/18/12/single agent Taxol x12 weeks from 02/02/2012 through January 2014   . Urinary frequency   . Use of tamoxifen (Nolvadex)    now stopped. will be starting ibrance on 03/05/18  . Vertigo     PAST SURGICAL HISTORY:   Past Surgical History:  Procedure Laterality Date  . BREAST LUMPECTOMY  2008   left  . COLONOSCOPY    . COLONOSCOPY WITH PROPOFOL N/A 10/10/2017   Procedure: COLONOSCOPY WITH PROPOFOL;  Surgeon: Lucilla Lame, MD;  Location: [facility] END[facility]COPY;  Service: Endoscopy;  Laterality: N/A;  . CYSTOSCOPY WITH STENT PLACEMENT Bilateral 12/17/2017   Procedure: CYSTOSCOPY WITH STENT PLACEMENT;  Surgeon: Hollice Espy, MD;  Location: [facility] ORS;  Service: Urology;  Laterality: Bilateral;  . IR NEPHROSTOMY EXCHANGE LEFT  02/13/2018  . IR  NEPHROSTOMY EXCHANGE RIGHT  01/02/2018  . IR NEPHROSTOMY EXCHANGE RIGHT  02/13/2018  . IR NEPHROSTOMY PLACEMENT LEFT  12/19/2017  . IR NEPHROSTOMY PLACEMENT RIGHT  12/19/2017  . IR NEPHROSTOMY PLACEMENT RIGHT  04/06/2018  . LUMBAR PUNCTURE  03/01/2018  . MASTECTOMY Bilateral 2013  . MASTECTOMY WITH AXILLARY LYMPH NODE DISSECTION Right 05/18/2012   Procedure: MASTECTOMY WITH AXILLARY LYMPH NODE DISSECTION;  Surgeon: Adin Hector, MD;  Location: [place];  Service: General;  Laterality: Right;  Bilateral Total Mastectomy , right axillary lymph node dissection left axillary sentinel node biopsy, removal of Porta Cath  . NEPHROSTOMY TUBE REMOVAL Bilateral 03/01/2018   removed tubes in dr. Erlene Quan office  . PORT-A-CATH REMOVAL N/A 05/18/2012   Procedure: REMOVAL PORT-A-CATH;  Surgeon: Adin Hector, MD;  Location: [place];  Service: General;  Laterality: N/A;  . PORTA CATH INSERTION N/A 12/26/2017   Procedure: PORTA CATH INSERTION;  Surgeon:  Katha Cabal, MD;  Location: [place] [facility];  Service: Cardiovascular;  Laterality: N/A;  . PORTACATH PLACEMENT  12/02/2011   Procedure: INSERTION PORT-A-CATH;  Surgeon: Adin Hector, MD;  Location: [place];  Service: General;  Laterality: N/A;  insertion port a cath with fluoroscopy  . SIMPLE MASTECTOMY WITH AXILLARY SENTINEL NODE BIOPSY Left 05/18/2012   Procedure: SIMPLE MASTECTOMY WITH AXILLARY SENTINEL NODE BIOPSY;  Surgeon: Adin Hector, MD;  Location: [place];  Service: General;  Laterality: Left;    SOCIAL HISTORY:   Social History   Tobacco Use  . Smoking status: Former Smoker    Packs/day: 0.50    Years: 15.00    Pack years: 7.50    Types: Cigarettes    Last attempt to quit: 03/21/1997    Years since quitting: 21.0  . Smokeless tobacco: Never Used  Substance Use Topics  . Alcohol use: Not Currently    Alcohol/week: 0.0 standard drinks    Comment: occasionally wine    FAMILY HISTORY:   Family History  Problem Relation Age of Onset  . Hypertension Mother   . Diabetes Mother   . Hypertension Maternal Grandmother   . Pancreatic cancer Paternal Grandmother        diagnosed in her 69s  . Ovarian cancer Maternal Aunt        maternal grandmother's sister  . Breast cancer Paternal Aunt        diagnosed in late 41s early 86s  . Parkinsonism Paternal Grandfather     DRUG ALLERGIES:   Allergies  Allergen Reactions  . Adhesive [Tape] Other (See Comments)    Redness:Please use paper tape    REVIEW OF SYSTEMS:   Review of Systems  Constitutional: Positive for malaise/fatigue. Negative for chills and fever.  HENT: Negative for sore throat.   Eyes: Negative for blurred vision and double vision.  Respiratory: Negative for cough, hemoptysis, shortness of breath, wheezing and stridor.   Cardiovascular: Negative for chest pain, palpitations, orthopnea and leg swelling.  Gastrointestinal: Negative for abdominal pain, blood in stool,  diarrhea, melena, nausea and vomiting.  Genitourinary: Negative for dysuria, flank pain and hematuria.  Musculoskeletal: Negative for back pain and joint pain.  Skin: Negative for itching and rash.  Neurological: Positive for dizziness and weakness. Negative for sensory change, focal weakness, seizures, loss of consciousness and headaches.  Endo/Heme/Allergies: Negative for polydipsia.  Psychiatric/Behavioral: Negative for depression. The patient is not nervous/anxious.     MEDICATIONS AT HOME:   Prior to Admission medications   Medication Sig Start  Date End Date Taking? Authorizing Provider  abemaciclib (VERZENIO) 150 MG tablet Take 1 tablet (150 mg total) by mouth 2 (two) times daily. Patient not taking: Reported on 04/06/2018 03/20/18   Earlie Server, MD  amLODipine (NORVASC) 5 MG tablet Take 1 tablet (5 mg total) by mouth daily. 04/02/18   Earlie Server, MD  apixaban (ELIQUIS) 2.5 MG TABS tablet Take 1 tablet (2.5 mg total) by mouth 2 (two) times daily. 04/02/18   Earlie Server, MD  Ascorbic Acid (VITAMIN C) 100 MG tablet Take 100 mg by mouth daily.      [provider]  Calcium Carbonate-Vitamin D (CALTRATE 600+D PO) Take 1 tablet by mouth daily.    [provider]  dexamethasone (DECADRON) 4 MG tablet Take 1 tablet (4 mg total) by mouth See admin instructions. You may take 4 mg daily for 2 days after chemotherapy if needed for nausea Patient not taking: Reported on 04/02/2018 12/22/17   Loletha Grayer, MD  diazepam (VALIUM) 10 MG tablet Take 1 tablet (10 mg total) by mouth at bedtime as needed for anxiety. 09/18/17   [name], MD  dronabinol (MARINOL) 5 MG capsule Take 1 capsule (5 mg total) by mouth 2 (two) times daily before lunch and supper. 04/04/18   Earlie Server, MD  esomeprazole (NEXIUM) 40 MG capsule Take 40 mg by mouth daily as needed.     [provider]  ferrous sulfate 325 (65 FE) MG EC tablet Take 1 tablet (325 mg total) by mouth daily. Patient not taking:  Reported on 04/06/2018 01/05/18   Earlie Server, MD  fulvestrant (FASLODEX) 250 MG/5ML injection Inject 250 mg into the muscle every 14 (fourteen) days. One injection each buttock over 1-2 minutes. Warm prior to use.    [provider]  furosemide (LASIX) 20 MG tablet Take 1 tablet (20 mg total) by mouth daily as needed. Patient not taking: Reported on 04/06/2018 01/05/18   Earlie Server, MD  goserelin (ZOLADEX) 3.6 MG injection Inject 3.6 mg into the skin every 28 (twenty-eight) days.    [provider]  lidocaine-prilocaine (EMLA) cream Apply to affected area once 12/21/17   Earlie Server, MD  loperamide (IMODIUM A-D) 2 MG tablet Take 2 mg by mouth 4 (four) times daily as needed for diarrhea or loose stools.     [provider]  Multiple Vitamin (MULTIVITAMIN) tablet Take 1 tablet by mouth daily.      [provider]  ondansetron (ZOFRAN) 4 MG tablet Take 1 tablet (4 mg total) by mouth every 6 (six) hours as needed for nausea or vomiting. 03/19/18   Earlie Server, MD  oxybutynin (DITROPAN) 5 MG tablet Take 1 tablet (5 mg total) by mouth every 8 (eight) hours as needed for bladder spasms. 12/22/17   Loletha Grayer, MD  oxyCODONE (OXY IR/ROXICODONE) 5 MG immediate release tablet Take 1 tablet (5 mg total) by mouth every 6 (six) hours as needed for severe pain. 03/19/18   Earlie Server, MD  oxyCODONE (OXYCONTIN) 10 mg 12 hr tablet Take 1 tablet (10 mg total) by mouth every 12 (twelve) hours. 03/19/18   Earlie Server, MD  potassium chloride SA (K-DUR,KLOR-CON) 20 MEQ tablet Take 1 tablet (20 mEq total) by mouth daily. Patient not taking: Reported on 04/06/2018 12/25/17   Earlie Server, MD  prochlorperazine (COMPAZINE) 10 MG tablet Take 1 tablet (10 mg total) by mouth every 6 (six) hours as needed (Nausea or vomiting). 12/21/17   Earlie Server, MD  scopolamine (TRANSDERM-SCOP)  1 MG/3DAYS Place 1 patch (1.5 mg total) onto the skin every 3 (three) days. 03/19/18   Earlie Server, MD  traZODone (DESYREL) 50 MG tablet  Take 0.5-1 tablets (25-50 mg total) by mouth at bedtime as needed for sleep. 09/18/17   [name], MD      VITAL SIGNS:  Blood pressure 138/83, pulse 88, temperature 98.3 F (36.8 C), temperature source Oral, resp. rate 15, height 5\' 4"  (1.626 m), weight 63.5 kg, last menstrual period 11/28/2011, SpO2 97 %.  PHYSICAL EXAMINATION:  Physical Exam  GENERAL:  58 y.o.-year-old patient lying in the bed with no acute distress.  EYES: Pupils equal, round, reactive to light and accommodation. No scleral icterus. Extraocular muscles intact.  HEENT: Head atraumatic, normocephalic. Oropharynx and nasopharynx clear.  NECK:  Supple, no jugular venous distention. No thyroid enlargement, no tenderness.  LUNGS: Normal breath sounds bilaterally, no wheezing, rales,rhonchi or crepitation. No use of accessory muscles of respiration.  CARDIOVASCULAR: S1, S2 normal. No murmurs, rubs, or gallops.  ABDOMEN: Soft, nontender, nondistended. Bowel sounds present. No organomegaly or mass.  EXTREMITIES: No pedal edema, cyanosis, or clubbing.  NEUROLOGIC: Cranial nerves II through XII are intact. Muscle strength 5/5 in all extremities. Sensation intact. Gait not checked.  PSYCHIATRIC: The patient is alert and oriented x 3.  SKIN: No obvious rash, lesion, or ulcer.   LABORATORY PANEL:   CBC Recent Labs  Lab 04/05/18 1115  WBC 3.5*  HGB 11.3*  HCT 35.1*  PLT 293   ------------------------------------------------------------------------------------------------------------------  Chemistries  Recent Labs  Lab 04/05/18 1115  NA 136  K 3.5  CL 104  CO2 23  GLUCOSE 132*  BUN 13  CREATININE 1.44*  CALCIUM 8.4*  AST 36  ALT 28  ALKPHOS 107  BILITOT 0.4   ------------------------------------------------------------------------------------------------------------------  Cardiac Enzymes No results for input(s): TROPONINI in the last 168  hours. ------------------------------------------------------------------------------------------------------------------  RADIOLOGY:  US Paracentesis  Result Date: 04/06/2018 INDICATION: Metastatic breast cancer, ascites, abdominal distension EXAM: ULTRASOUND GUIDED RIGHT PARACENTESIS MEDICATIONS: 1% LIDOCAINE LOCAL COMPLICATIONS: None immediate. PROCEDURE: Informed written consent was obtained from the patient after a discussion of the risks, benefits and alternatives to treatment. A timeout was performed prior to the initiation of the procedure. Initial ultrasound scanning demonstrates a large amount of ascites within the right lower abdominal quadrant. The right lower abdomen was prepped and draped in the usual sterile fashion. 1% lidocaine with epinephrine was used for local anesthesia. Following this, a 6 Fr Safe-T-Centesis catheter was introduced. An ultrasound image was saved for documentation purposes. The paracentesis was performed. The catheter was removed and a dressing was applied. The patient tolerated the procedure well without immediate post procedural complication. FINDINGS: A total of approximately 4.2 L of PERITONEAL fluid was removed. Samples were sent to the laboratory as requested by the clinical team. IMPRESSION: Successful ultrasound-guided paracentesis yielding 4.2 liters of peritoneal fluid. Electronically Signed   By: Jerilynn Mages.  Shick M.D.   On: 04/06/2018 11:19   Ir Nephrostomy Placement Right  Result Date: 04/06/2018 INDICATION: Metastatic breast cancer, recurrent right hydronephrosis EXAM: ULTRASOUND AND FLUOROSCOPIC RIGHT NEPHROSTOMY INSERTION COMPARISON:  04/04/2018 MEDICATIONS: CIPRO 400 MG; The antibiotic was administered in an appropriate time frame prior to skin puncture. ANESTHESIA/SEDATION: Fentanyl 50 mcg IV; Versed 2 mg IV Moderate Sedation Time:  11 minutes The patient was continuously monitored during the procedure by the interventional radiology nurse under my direct  supervision. CONTRAST:  10 cc-administered into the collecting system(s) FLUOROSCOPY TIME:  Fluoroscopy Time: 0 minutes  30 seconds ( mGy). COMPLICATIONS: None immediate. PROCEDURE: Informed written consent was obtained from the patient after a thorough discussion of the procedural risks, benefits and alternatives. All questions were addressed. Maximal Sterile Barrier Technique was utilized including caps, mask, sterile gowns, sterile gloves, sterile drape, hand hygiene and skin antiseptic. A timeout was performed prior to the initiation of the procedure. Previous imaging reviewed. Patient position prone. Right hydronephrotic kidney was localized and marked. Under sterile conditions anesthesia, an 18 gauge introducer needle was advanced into a dilated mid to lower pole calyx. Needle position confirmed with ultrasound. Images obtained for documentation. There was return of urine. Guidewire inserted followed by tract dilatation to insert a 10 French nephrostomy. Retention loop formed the renal pelvis. Contrast injection confirms position. Images obtained for documentation. Catheter secured with a prolene suture and a sterile dressing. Gravity drainage bag connected. No immediate complication. Patient tolerated the procedure well. IMPRESSION: Successful ultrasound and fluoroscopic 10 French right nephrostomy insertion Electronically Signed   By: Jerilynn Mages.  Shick M.D.   On: 04/06/2018 12:05      IMPRESSION AND PLAN:   Generalized weakness. The patient will be placed for observation. PT evaluation and social worker consult for placement.  Dizziness.  Unclear etiology.  Encourage oral intake.  Orthostatic vital sign.  PT evaluation.  Breast cancer with metastasis. Palliative care consult and follow-up Dr. Tasia Catchings as outpatient.  Right hydronephrosis, s/p nephrostomy tube placement by IR.  Follow-up with Dr. Hassan Rowan as outpatient.  Ascites.  S/p paracentesis.  All the records are reviewed and case discussed with ED  provider. Management plans discussed with the patient, her husband and they are in agreement.  CODE STATUS: DNR.  TOTAL TIME TAKING CARE OF THIS PATIENT: 30  minutes.    Demetrios Loll M.D on 04/06/2018 at 3:44 PM  Between 7am to 6pm - Pager - [phone]  After 6pm go to [email or web address] - Proofreader  [facility]  Office  [phone]  CC: Primary care physician; [name], MD   Note: This dictation was prepared with Dragon dictation along with smaller phrase technology. Any transcriptional errors that result from this process are unin

## 2018-04-06 NOTE — Progress Notes (Signed)
Dr. Annamaria Boots notified of patient itching and rash improving, but family insisting patient be admitted due to "weakness and can't care for her at home".  Per Dr. Docia Chuck. Is stable after procedure and can be discharged home, but recommends calling Dr. Tasia Catchings.  Call placed to Dr. Tasia Catchings who spoke with husband.  Per Dr. Zella Richer to be taken to the ED-she will call ED and discuss pt. With them..  Report called to ED.

## 2018-04-06 NOTE — ED Notes (Signed)
Port flushed with NS, pt tolerated well, pt appears in NAD. Placed on bed pan to urinate, however, pt states "false alarm."

## 2018-04-06 NOTE — Procedures (Signed)
MET BREAST CA WITH ASCITES AND RECURRENT RT HYDRO  S/P [place] PARA  S/P RT PCN  NO COMP  Stable EBL min Full report in pacs

## 2018-04-06 NOTE — Progress Notes (Signed)
Met with patient and her husband and SIL- and will meet the patient in patient once she is settled to discuss supported living options at this time. 10am tentatively set up for the morning and LCSW card provided to family  [employer] LCSW [phone]

## 2018-04-06 NOTE — ED Notes (Signed)
Pt appears in no distress, family at bedside.

## 2018-04-06 NOTE — ED Triage Notes (Addendum)
PT arrived from [facility] procedure. PT had a paracentesis prior to going to [facility]. Pt had 4L removed. Pt then went to [facility] for a right nephrotomy tube. PT was given cipro, fentanyl, versed, and albumin; not together but within a short period of time. Pt then reported a scratchy throat, rash to her back, and itchy hands. PT was given 50mg  of benadryl. Pt arrives to the ED and reports symptoms have lessened in severity. Pt in NAD, no shortness of breath, breathing equal and unlabored.

## 2018-04-06 NOTE — OR Nursing (Signed)
Dr Annamaria Boots notified that she is complaining of itching palms, po benadryl given and for headache acetaminophin. Pt reporting increasing symptoms with scratchy and swollen. Torso flushing noted. Albumin stopped. Dr Annamaria Boots arrived at bedside and ordered additional 25 mg Benadryl

## 2018-04-06 NOTE — ED Provider Notes (Signed)
[facility] Emergency Department Provider Note   ____________________________________________   First MD Initiated Contact with Patient 04/06/18 1510     (approximate)  I have reviewed the triage vital signs and the nursing notes.   HISTORY  Chief Complaint Allergic Reaction    HPI JANNEY PRIEGO is a 58 y.o. female with metastatic breast cancer.  She came in for a paracentesis for worsening ascites and replacement of her right nephrostomy tube.  These are complications of her metastatic breast cancer.  These were done she got some albumin infused had some hives which have now resolved with Benadryl.  I spoke to Dr. Holli Humbles [facility] who performed these procedures.  Apparently they were ready to go home and the husband said that he can take care of her at home currently she is too weak she falls a lot she can get to the bathroom they do not have a bedside commode other family members here with her reinforced this.   Past Medical History:  Diagnosis Date  . Anxiety    takes Xanax prn  . Breast cancer [facility]) August 2013   bilateral, er/pr+, bladder (02/2018..mets from breast ca)  . Chronic kidney disease 01/2018   recent shut down of kidneys with labs out of whack  . Complication of anesthesia    pt states she had the shakes extremely bad  . Cough    at night d/t reflux;nothing productive  . Dental crowns present    x 4  . Depression   . GERD (gastroesophageal reflux disease)    takes Nexium daily  . History of bronchitis    08/2011  . History of colon polyps   . Hot flashes, menopausal 08/08/2012  . IBS (irritable bowel syndrome)    immodium prn  . Insomnia    takes Ambien nightly  . Irritable bowel syndrome   . Neutropenia, drug-induced ([place]) 12/15/2011  . Nocturia   . Plantar fasciitis   . S/P radiation therapy 07/03/2012 through 08/18/2038                                                        Right chest wall/regional lymph nodes 5040 cGy 28  sessions, right chest wall/mastectomy scar boost 1000 cGy 5 sessions                          . Status post chemotherapy    FEC 100 starting on 12/08/2011 - 01/18/12/single agent Taxol x12 weeks from 02/02/2012 through January 2014   . Urinary frequency   . Use of tamoxifen (Nolvadex)    now stopped. will be starting ibrance on 03/05/18  . Vertigo     Patient Active Problem List   Diagnosis Date Noted  . 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 Goals of care, counseling/discussion 01/14/2018  . Edema of left lower extremity   . Metastatic breast cancer ([place])   . Hypercalcemia 12/16/2017  . Malignancy ([place])   . Acute deep vein thrombosis (DVT) of distal vein of left lower extremity ([place])   . Dizziness   . Bilateral posterior neck pain 11/24/2017  . Elevated liver enzymes 11/24/2017  . Tubular adenoma of colon 10/11/2017  . 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 Generalized anxiety disorder 09/19/2017  . Vertigo,  labyrinthine, bilateral 08/27/2017  . Prediabetes 08/27/2017  . Encounter for screening colonoscopy 09/21/2014  . Other malaise and fatigue 12/14/2013  . Hot flashes, menopausal 08/08/2012  . Neutropenia, drug-induced ([place]) 12/15/2011  . Primary cancer of upper outer quadrant of left female breast ([place]) 11/17/2011  . Hypertension 09/23/2011  . PLANTAR FASCIITIS, LEFT 12/01/2009  . UNEQUAL LEG LENGTH 12/01/2009    Past Surgical History:  Procedure Laterality Date  . BREAST LUMPECTOMY  2008   left  . COLONOSCOPY    . COLONOSCOPY WITH PROPOFOL N/A 10/10/2017   Procedure: COLONOSCOPY WITH PROPOFOL;  Surgeon: Lucilla Lame, MD;  Location: [facility] ENDOSCOPY;  Service: Endoscopy;  Laterality: N/A;  . CYSTOSCOPY WITH STENT PLACEMENT Bilateral 12/17/2017   Procedure: CYSTOSCOPY WITH STENT PLACEMENT;  Surgeon: Hollice Espy, MD;  Location: [facility] ORS;  Service: Urology;  Laterality: Bilateral;  . IR NEPHROSTOMY EXCHANGE LEFT  02/13/2018  . IR NEPHROSTOMY EXCHANGE RIGHT   01/02/2018  . IR NEPHROSTOMY EXCHANGE RIGHT  02/13/2018  . IR NEPHROSTOMY PLACEMENT LEFT  12/19/2017  . IR NEPHROSTOMY PLACEMENT RIGHT  12/19/2017  . IR NEPHROSTOMY PLACEMENT RIGHT  04/06/2018  . LUMBAR PUNCTURE  03/01/2018  . MASTECTOMY Bilateral 2013  . MASTECTOMY WITH AXILLARY LYMPH NODE DISSECTION Right 05/18/2012   Procedure: MASTECTOMY WITH AXILLARY LYMPH NODE DISSECTION;  Surgeon: Adin Hector, MD;  Location: [place];  Service: General;  Laterality: Right;  Bilateral Total Mastectomy , right axillary lymph node dissection left axillary sentinel node biopsy, removal of Porta Cath  . NEPHROSTOMY TUBE REMOVAL Bilateral 03/01/2018   removed tubes in dr. Erlene Quan office  . PORT-A-CATH REMOVAL N/A 05/18/2012   Procedure: REMOVAL PORT-A-CATH;  Surgeon: Adin Hector, MD;  Location: [place];  Service: General;  Laterality: N/A;  . PORTA CATH INSERTION N/A 12/26/2017   Procedure: PORTA CATH INSERTION;  Surgeon: Katha Cabal, MD;  Location: [place] [facility];  Service: Cardiovascular;  Laterality: N/A;  . PORTACATH PLACEMENT  12/02/2011   Procedure: INSERTION PORT-A-CATH;  Surgeon: Adin Hector, MD;  Location: [place];  Service: General;  Laterality: N/A;  insertion port a cath with fluoroscopy  . SIMPLE MASTECTOMY WITH AXILLARY SENTINEL NODE BIOPSY Left 05/18/2012   Procedure: SIMPLE MASTECTOMY WITH AXILLARY SENTINEL NODE BIOPSY;  Surgeon: Adin Hector, MD;  Location: [place];  Service: General;  Laterality: Left;    Prior to Admission medications   Medication Sig Start Date End Date Taking? Authorizing Provider  abemaciclib (VERZENIO) 150 MG tablet Take 1 tablet (150 mg total) by mouth 2 (two) times daily. Patient not taking: Reported on 04/06/2018 03/20/18   Earlie Server, MD  amLODipine (NORVASC) 5 MG tablet Take 1 tablet (5 mg total) by mouth daily. 04/02/18   Earlie Server, MD  apixaban (ELIQUIS) 2.5 MG TABS tablet Take 1 tablet (2.5 mg total) by mouth 2 (two) times daily.  04/02/18   Earlie Server, MD  Ascorbic Acid (VITAMIN C) 100 MG tablet Take 100 mg by mouth daily.      [provider]  Calcium Carbonate-Vitamin D (CALTRATE 600+D PO) Take 1 tablet by mouth daily.    [provider]  dexamethasone (DECADRON) 4 MG tablet Take 1 tablet (4 mg total) by mouth See admin instructions. You may take 4 mg daily for 2 days after chemotherapy if needed for nausea Patient not taking: Reported on 04/02/2018 12/22/17   Loletha Grayer, MD  diazepam (VALIUM) 10 MG tablet Take 1 tablet (10 mg total) by mouth at  bedtime as needed for anxiety. 09/18/17   [name], MD  dronabinol (MARINOL) 5 MG capsule Take 1 capsule (5 mg total) by mouth 2 (two) times daily before lunch and supper. 04/04/18   Earlie Server, MD  esomeprazole (NEXIUM) 40 MG capsule Take 40 mg by mouth daily as needed.     [provider]  ferrous sulfate 325 (65 FE) MG EC tablet Take 1 tablet (325 mg total) by mouth daily. Patient not taking: Reported on 04/06/2018 01/05/18   Earlie Server, MD  fulvestrant (FASLODEX) 250 MG/5ML injection Inject 250 mg into the muscle every 14 (fourteen) days. One injection each buttock over 1-2 minutes. Warm prior to use.    [provider]  furosemide (LASIX) 20 MG tablet Take 1 tablet (20 mg total) by mouth daily as needed. Patient not taking: Reported on 04/06/2018 01/05/18   Earlie Server, MD  goserelin (ZOLADEX) 3.6 MG injection Inject 3.6 mg into the skin every 28 (twenty-eight) days.    [provider]  lidocaine-prilocaine (EMLA) cream Apply to affected area once 12/21/17   Earlie Server, MD  loperamide (IMODIUM A-D) 2 MG tablet Take 2 mg by mouth 4 (four) times daily as needed for diarrhea or loose stools.     [provider]  Multiple Vitamin (MULTIVITAMIN) tablet Take 1 tablet by mouth daily.      [provider]  ondansetron (ZOFRAN) 4 MG tablet Take 1 tablet (4 mg total) by mouth every 6 (six) hours as needed for nausea or vomiting.  03/19/18   Earlie Server, MD  oxybutynin (DITROPAN) 5 MG tablet Take 1 tablet (5 mg total) by mouth every 8 (eight) hours as needed for bladder spasms. 12/22/17   Loletha Grayer, MD  oxyCODONE (OXY IR/ROXICODONE) 5 MG immediate release tablet Take 1 tablet (5 mg total) by mouth every 6 (six) hours as needed for severe pain. 03/19/18   Earlie Server, MD  oxyCODONE (OXYCONTIN) 10 mg 12 hr tablet Take 1 tablet (10 mg total) by mouth every 12 (twelve) hours. 03/19/18   Earlie Server, MD  potassium chloride SA (K-DUR,KLOR-CON) 20 MEQ tablet Take 1 tablet (20 mEq total) by mouth daily. Patient not taking: Reported on 04/06/2018 12/25/17   Earlie Server, MD  prochlorperazine (COMPAZINE) 10 MG tablet Take 1 tablet (10 mg total) by mouth every 6 (six) hours as needed (Nausea or vomiting). 12/21/17   Earlie Server, MD  scopolamine (TRANSDERM-SCOP) 1 MG/3DAYS Place 1 patch (1.5 mg total) onto the skin every 3 (three) days. 03/19/18   Earlie Server, MD  traZODone (DESYREL) 50 MG tablet Take 0.5-1 tablets (25-50 mg total) by mouth at bedtime as needed for sleep. 09/18/17   [name], MD    Allergies Adhesive [tape]  Family History  Problem Relation Age of Onset  . Hypertension Mother   . Diabetes Mother   . Hypertension Maternal Grandmother   . Pancreatic cancer Paternal Grandmother        diagnosed in her 72s  . Ovarian cancer Maternal Aunt        maternal grandmother's sister  . Breast cancer Paternal Aunt        diagnosed in late 79s early 52s  . Parkinsonism Paternal Grandfather     Social History Social History   Tobacco Use  . Smoking status: Former Smoker    Packs/day: 0.50    Years: 15.00    Pack years: 7.50    Types: Cigarettes    Last attempt to quit:  03/21/1997    Years since quitting: 21.0  . Smokeless tobacco: Never Used  Substance Use Topics  . Alcohol use: Not Currently    Alcohol/week: 0.0 standard drinks    Comment: occasionally wine  . Drug use: No    Review of Systems  Constitutional: No  fever/chills Eyes: No visual changes. ENT: No sore throat. Cardiovascular: Denies chest pain. Respiratory: Denies shortness of breath. Gastrointestinal: No abdominal pain currently.   no vomiting.  No diarrhea.  No constipation. Genitourinary: Negative for dysuria. Musculoskeletal: Negative for back pain. Skin: Negative for rash. Neurological: Negative for headaches, focal weakness   ____________________________________________   PHYSICAL EXAM:  VITAL SIGNS: ED Triage Vitals  Enc Vitals Group     BP 04/06/18 1458 138/83     Pulse Rate 04/06/18 1458 88     Resp 04/06/18 1458 15     Temp 04/06/18 1458 98.3 F (36.8 C)     Temp Source 04/06/18 1458 Oral     SpO2 04/06/18 1458 97 %     Weight 04/06/18 1459 140 lb (63.5 kg)     Height 04/06/18 1459 5\' 4"  (1.626 m)     Head Circumference --      Peak Flow --      Pain Score 04/06/18 1459 0     Pain Loc --      Pain Edu? --      Excl. in [place]? --     Constitutional: Alert and oriented. Well appearing and in no acute distress. Eyes: Conjunctivae are normal.  Head: Atraumatic. Nose: No congestion/rhinnorhea. Mouth/Throat: Mucous membranes are moist.  Oropharynx non-erythematous. Neck: No stridor. Cardiovascular: Normal rate, regular rhythm. Grossly normal heart sounds.  Good peripheral circulation. Respiratory: Normal respiratory effort.  No retractions. Lungs CTAB. Gastrointestinal: Soft some tenderness in the left lower quadrant.  The dressing from the paracentesis is intact on the right side nephrostomy tube dressing is intact on the right CVA area that is draining well.. No distention. No abdominal bruits. Musculoskeletal: No lower extremity tenderness nor edema. Neurologic:  Normal speech and language. No gross focal neurologic deficits are appreciated. No gait instability. Skin:  Skin is warm, dry and intact. No rash noted. Psychiatric: Mood and affect are normal. Speech and behavior are  normal.  ____________________________________________   LABS (all labs ordered are listed, but only abnormal results are displayed)  Labs Reviewed - No data to display ____________________________________________  EKG   ____________________________________________  RADIOLOGY  ED MD interpretation: Paracentesis done ultrasound-guided 4.2 L of fluid removed sent for labs.  Official radiology report(s): US Paracentesis  Result Date: 04/06/2018 INDICATION: Metastatic breast cancer, ascites, abdominal distension EXAM: ULTRASOUND GUIDED RIGHT PARACENTESIS MEDICATIONS: 1% LIDOCAINE LOCAL COMPLICATIONS: None immediate. PROCEDURE: Informed written consent was obtained from the patient after a discussion of the risks, benefits and alternatives to treatment. A timeout was performed prior to the initiation of the procedure. Initial ultrasound scanning demonstrates a large amount of ascites within the right lower abdominal quadrant. The right lower abdomen was prepped and draped in the usual sterile fashion. 1% lidocaine with epinephrine was used for local anesthesia. Following this, a 6 Fr Safe-T-Centesis catheter was introduced. An ultrasound image was saved for documentation purposes. The paracentesis was performed. The catheter was removed and a dressing was applied. The patient tolerated the procedure well without immediate post procedural complication. FINDINGS: A total of approximately 4.2 L of PERITONEAL fluid was removed. Samples were sent to the laboratory as requested by the clinical team. IMPRESSION:  Successful ultrasound-guided paracentesis yielding 4.2 liters of peritoneal fluid. Electronically Signed   By: Jerilynn Mages.  Shick M.D.   On: 04/06/2018 11:19   Ir Nephrostomy Placement Right  Result Date: 04/06/2018 INDICATION: Metastatic breast cancer, recurrent right hydronephrosis EXAM: ULTRASOUND AND FLUOROSCOPIC RIGHT NEPHROSTOMY INSERTION COMPARISON:  04/04/2018 MEDICATIONS: CIPRO 400 MG; The  antibiotic was administered in an appropriate time frame prior to skin puncture. ANESTHESIA/SEDATION: Fentanyl 50 mcg IV; Versed 2 mg IV Moderate Sedation Time:  11 minutes The patient was continuously monitored during the procedure by the interventional radiology nurse under my direct supervision. CONTRAST:  10 cc-administered into the collecting system(s) FLUOROSCOPY TIME:  Fluoroscopy Time: 0 minutes 30 seconds ( mGy). COMPLICATIONS: None immediate. PROCEDURE: Informed written consent was obtained from the patient after a thorough discussion of the procedural risks, benefits and alternatives. All questions were addressed. Maximal Sterile Barrier Technique was utilized including caps, mask, sterile gowns, sterile gloves, sterile drape, hand hygiene and skin antiseptic. A timeout was performed prior to the initiation of the procedure. Previous imaging reviewed. Patient position prone. Right hydronephrotic kidney was localized and marked. Under sterile conditions anesthesia, an 18 gauge introducer needle was advanced into a dilated mid to lower pole calyx. Needle position confirmed with ultrasound. Images obtained for documentation. There was return of urine. Guidewire inserted followed by tract dilatation to insert a 10 French nephrostomy. Retention loop formed the renal pelvis. Contrast injection confirms position. Images obtained for documentation. Catheter secured with a prolene suture and a sterile dressing. Gravity drainage bag connected. No immediate complication. Patient tolerated the procedure well. IMPRESSION: Successful ultrasound and fluoroscopic 10 French right nephrostomy insertion Electronically Signed   By: Jerilynn Mages.  Shick M.D.   On: 04/06/2018 12:05    ____________________________________________   PROCEDURES  Procedure(s) performed:   Procedures  Critical Care performed:   ____________________________________________   INITIAL IMPRESSION / ASSESSMENT AND PLAN / ED COURSE  Patient and  husband says she is too weak to go home.         ____________________________________________   FINAL CLINICAL IMPRESSION(S) / ED DIAGNOSES  Final diagnoses:  Weakness     ED Discharge Orders    None       Note:  This document was prepared using Dragon voice recognition software and may include unintentional dictation errors.    Nena Polio, MD 04/06/18 [phone]

## 2018-04-07 DIAGNOSIS — Z9889 Other specified postprocedural states: Secondary | ICD-10-CM | POA: Diagnosis not present

## 2018-04-07 DIAGNOSIS — F411 Generalized anxiety disorder: Secondary | ICD-10-CM | POA: Diagnosis not present

## 2018-04-07 DIAGNOSIS — R188 Other ascites: Secondary | ICD-10-CM | POA: Diagnosis not present

## 2018-04-07 DIAGNOSIS — N1330 Unspecified hydronephrosis: Secondary | ICD-10-CM | POA: Diagnosis not present

## 2018-04-07 DIAGNOSIS — M6281 Muscle weakness (generalized): Secondary | ICD-10-CM | POA: Diagnosis not present

## 2018-04-07 DIAGNOSIS — C50919 Malignant neoplasm of unspecified site of unspecified female breast: Secondary | ICD-10-CM | POA: Diagnosis not present

## 2018-04-07 DIAGNOSIS — N183 Chronic kidney disease, stage 3 (moderate): Secondary | ICD-10-CM | POA: Diagnosis not present

## 2018-04-07 DIAGNOSIS — N179 Acute kidney failure, unspecified: Secondary | ICD-10-CM | POA: Diagnosis not present

## 2018-04-07 DIAGNOSIS — R531 Weakness: Secondary | ICD-10-CM | POA: Diagnosis not present

## 2018-04-07 DIAGNOSIS — Z66 Do not resuscitate: Secondary | ICD-10-CM | POA: Diagnosis not present

## 2018-04-07 DIAGNOSIS — R2681 Unsteadiness on feet: Secondary | ICD-10-CM | POA: Diagnosis not present

## 2018-04-07 DIAGNOSIS — C50911 Malignant neoplasm of unspecified site of right female breast: Secondary | ICD-10-CM | POA: Diagnosis not present

## 2018-04-07 DIAGNOSIS — C801 Malignant (primary) neoplasm, unspecified: Secondary | ICD-10-CM | POA: Diagnosis not present

## 2018-04-07 DIAGNOSIS — F329 Major depressive disorder, single episode, unspecified: Secondary | ICD-10-CM | POA: Diagnosis not present

## 2018-04-07 LAB — BASIC METABOLIC PANEL
ANION GAP: 6 (ref 5–15)
BUN: 10 mg/dL (ref 6–20)
CHLORIDE: 103 mmol/L (ref 98–111)
CO2: 26 mmol/L (ref 22–32)
Calcium: 7.6 mg/dL — ABNORMAL LOW (ref 8.9–10.3)
Creatinine, Ser: 0.98 mg/dL (ref 0.44–1.00)
GFR calc Af Amer: >60 mL/min (ref >60)
GFR calc non Af Amer: >60 mL/min (ref >60)
Glucose, Bld: 90 mg/dL (ref 70–99)
Potassium: 3.5 mmol/L (ref 3.5–5.1)
Sodium: 135 mmol/L (ref 135–145)

## 2018-04-07 LAB — CBC
HCT: 33.8 % — ABNORMAL LOW (ref 36.0–46.0)
Hemoglobin: 11 g/dL — ABNORMAL LOW (ref 12.0–15.0)
MCH: 30.1 pg (ref 26.0–34.0)
MCHC: 32.5 g/dL (ref 30.0–36.0)
MCV: 92.3 fL (ref 80.0–100.0)
Platelets: 201 10*3/uL (ref 150–400)
RBC: 3.66 MIL/uL — AB (ref 3.87–5.11)
RDW: 17.6 % — ABNORMAL HIGH (ref 11.5–15.5)
WBC: 4 10*3/uL (ref 4.0–10.5)
nRBC: 0 % (ref 0.0–0.2)

## 2018-04-07 MED ORDER — HEPARIN SOD (PORK) LOCK FLUSH 100 UNIT/ML IV SOLN
500.0000 [IU] | Freq: Once | INTRAVENOUS | Status: DC
  Filled 2018-04-07: qty 5

## 2018-04-07 NOTE — Care Management Note (Signed)
Case Management Note  Patient Details  Name: Mary [name] MRN: [id number] Date of Birth: 07-03-1960  Subjective/Objective:  Patient to be discharged per MD order. Orders in place for home health services. Patient and spouse agreeable to home health. CMS Medicare.gov Compare Post Acute Care list reviewed with patient and patient prefers [facility]. Referral for services placed with Jerm[facility]. DME 3 in 1 ordered. Obtained from [facility]. Delivered to the bedside. Outpatient palliative referral faxed. Family to transport.  Ines Bloomer RN BSN RNCM [phone]                   Action/Plan:   Expected Discharge Date:  04/07/18               Expected Discharge Plan:  [place]  In-House Referral:     Discharge planning Services  CM Consult  Post Acute Care Choice:  Durable Medical Equipment, Home Health Choice offered to:  Patient  DME Arranged:  3-N-1 DME Agency:  [place]:  RN, PT, Nurse's Aide [place] Agency:  [place]  Status of Service:  Completed, signed off  If discussed at [place] of Stay Meetings, dates discussed:    Additional Comments:  Latanya Maudlin, RN 04/07/2018, 11:14 AM

## 2018-04-07 NOTE — Discharge Summary (Signed)
[facility] NAME: Mary [name]    MR#:  [id number]  DATE OF BIRTH:  1960-12-25  DATE OF ADMISSION:  04/06/2018 ADMITTING PHYSICIAN: Demetrios Loll, MD  DATE OF DISCHARGE: 04/07/2018   PRIMARY CARE PHYSICIAN: [name], MD    ADMISSION DIAGNOSIS:  Weakness [R53.1]  DISCHARGE DIAGNOSIS:  Active Problems:   Weakness generalized   SECONDARY DIAGNOSIS:   Past Medical History:  Diagnosis Date  . Anxiety    takes Xanax prn  . Breast cancer [facility]) August 2013   bilateral, er/pr+, bladder (02/2018..mets from breast ca)  . Chronic kidney disease 01/2018   recent shut down of kidneys with labs out of whack  . Complication of anesthesia    pt states she had the shakes extremely bad  . Cough    at night d/t reflux;nothing productive  . Dental crowns present    x 4  . Depression   . GERD (gastroesophageal reflux disease)    takes Nexium daily  . History of bronchitis    08/2011  . History of colon polyps   . Hot flashes, menopausal 08/08/2012  . IBS (irritable bowel syndrome)    immodium prn  . Insomnia    takes Ambien nightly  . Irritable bowel syndrome   . Neutropenia, drug-induced ([place]) 12/15/2011  . Nocturia   . Plantar fasciitis   . S/P radiation therapy 07/03/2012 through 08/18/2038                                                        Right chest wall/regional lymph nodes 5040 cGy 28 sessions, right chest wall/mastectomy scar boost 1000 cGy 5 sessions                          . Status post chemotherapy    FEC 100 starting on 12/08/2011 - 01/18/12/single agent Taxol x12 weeks from 02/02/2012 through January 2014   . Urinary frequency   . Use of tamoxifen (Nolvadex)    now stopped. will be starting ibrance on 03/05/18  . Vertigo     HOSPITAL COURSE:  58 year old female with metastatic breast cancer and leptomeningeal disease with neurological symptoms who was sent from oncology office due to generalized weakness  and acute kidney injury.  1.  Acute kidney injury in the setting of poor p.o. intake and generalized weakness: Creatinine has improved  2.  Generalized weakness from metastatic breast cancer with leptomeningeal involvement Patient will be set up with home health and outpatient palliative care services  3.  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 Tasia Catchings as well as neurosurgery  4.  Ascites: Patient is status post paracentesis  5.  Hydronephrosis with nephrostomy tube on right side by IR.  Patient will follow Dr. Erlene Quan   DISCHARGE CONDITIONS AND DIET:   Stable for discharge on regular diet  CONSULTS OBTAINED:    DRUG ALLERGIES:   Allergies  Allergen Reactions  . Adhesive [Tape] Other (See Comments)    Redness:Please use paper tape    DISCHARGE MEDICATIONS:   Allergies as of 04/07/2018      Reactions   Adhesive [tape] Other (See Comments)   Redness:Please use paper tape      Medication  List    STOP taking these medications   oxybutynin 5 MG tablet Commonly known as:  DITROPAN   oxyCODONE 10 mg 12 hr tablet Commonly known as:  OXYCONTIN   oxyCODONE 5 MG immediate release tablet Commonly known as:  Oxy IR/ROXICODONE   traZODone 50 MG tablet Commonly known as:  DESYREL     TAKE these medications   amLODipine 5 MG tablet Commonly known as:  NORVASC Take 1 tablet (5 mg total) by mouth daily.   apixaban 2.5 MG Tabs tablet Commonly known as:  ELIQUIS Take 1 tablet (2.5 mg total) by mouth 2 (two) times daily.   CALTRATE 600+D PO Take 1 tablet by mouth daily.   diazepam 10 MG tablet Commonly known as:  VALIUM Take 1 tablet (10 mg total) by mouth at bedtime as needed for anxiety.   dronabinol 5 MG capsule Commonly known as:  MARINOL Take 1 capsule (5 mg total) by mouth 2 (two) times daily before lunch and supper.   esomeprazole 40 MG capsule Commonly known as:  NEXIUM Take 40 mg by  mouth daily as needed.   FASLODEX 250 MG/5ML injection Generic drug:  fulvestrant Inject 250 mg into the muscle every 14 (fourteen) days. One injection each buttock over 1-2 minutes. Warm prior to use. Notes to patient:  Inject every 14 days   ferrous sulfate 325 (65 FE) MG EC tablet Take 1 tablet (325 mg total) by mouth daily.   furosemide 20 MG tablet Commonly known as:  LASIX Take 1 tablet (20 mg total) by mouth daily as needed.   goserelin 3.6 MG injection Commonly known as:  ZOLADEX Inject 3.6 mg into the skin every 28 (twenty-eight) days. Notes to patient:  Inject every 28 days   lidocaine-prilocaine cream Commonly known as:  EMLA Apply to affected area once   loperamide 2 MG tablet Commonly known as:  IMODIUM A-D Take 2 mg by mouth 4 (four) times daily as needed for diarrhea or loose stools.   multivitamin tablet Take 1 tablet by mouth daily.   prochlorperazine 10 MG tablet Commonly known as:  COMPAZINE Take 1 tablet (10 mg total) by mouth every 6 (six) hours as needed (Nausea or vomiting).   scopolamine 1 MG/3DAYS Commonly known as:  TRANSDERM-SCOP Place 1 patch (1.5 mg total) onto the skin every 3 (three) days. Notes to patient:  Every 3 days   vitamin C 100 MG tablet Take 100 mg by mouth daily.            Durable Medical Equipment  (From admission, onward)         Start     Ordered   04/07/18 0901  For home use only DME Gilford Rile  [facility])  Once    Question:  Patient needs a walker to treat with the following condition  Answer:  Weakness   04/07/18 0901            Today   CHIEF COMPLAINT:  Patient feels better this morning.  Friends at bedside   VITAL SIGNS:  Blood pressure 136/79, pulse (!) 102, temperature 98.7 F (37.1 C), temperature source Oral, resp. rate 20, height 5\' 4"  (1.626 m), weight 64.2 kg, last menstrual period 11/28/2011, SpO2 97 %.   REVIEW OF SYSTEMS:  Review of Systems  Constitutional: Negative.  Negative for  chills, fever and malaise/fatigue.  HENT: Negative.  Negative for ear discharge, ear pain, hearing loss, nosebleeds and sore throat.   Eyes: Negative.  Negative for blurred  vision and pain.  Respiratory: Negative.  Negative for cough, hemoptysis, shortness of breath and wheezing.   Cardiovascular: Negative.  Negative for chest pain, palpitations and leg swelling.  Gastrointestinal: Negative.  Negative for abdominal pain, blood in stool, diarrhea, nausea and vomiting.  Genitourinary: Negative.  Negative for dysuria.  Musculoskeletal: Negative.  Negative for back pain.  Skin: Negative.   Neurological: Negative for dizziness, tremors, speech change, focal weakness, seizures and headaches.  Endo/Heme/Allergies: Negative.  Does not bruise/bleed easily.  Psychiatric/Behavioral: Negative.  Negative for depression, hallucinations and suicidal ideas.     PHYSICAL EXAMINATION:  GENERAL:  58 y.o.-year-old patient lying in the bed with no acute distress.  NECK:  Supple, no jugular venous distention. No thyroid enlargement, no tenderness.  LUNGS: Normal breath sounds bilaterally, no wheezing, rales,rhonchi  No use of accessory muscles of respiration.  CARDIOVASCULAR: S1, S2 normal. No murmurs, rubs, or gallops.  ABDOMEN: Soft, non-tender, non-distended. Bowel sounds present. No organomegaly or mass.  EXTREMITIES: No pedal edema, cyanosis, or clubbing.  PSYCHIATRIC: The patient is alert and oriented x 3.  SKIN: No obvious rash, lesion, or ulcer.  She has right for ostomy tube placed  DATA REVIEW:   CBC Recent Labs  Lab 04/07/18 0625  WBC 4.0  HGB 11.0*  HCT 33.8*  PLT 201    Chemistries  Recent Labs  Lab 04/05/18 1115 04/06/18 1837 04/07/18 0625  NA 136 134* 135  K 3.5 3.1* 3.5  CL 104 102 103  CO2 23 25 26   GLUCOSE 132* 159* 90  BUN 13 11 10   CREATININE 1.44* 1.14* 0.98  CALCIUM 8.4* 7.5* 7.6*  MG  --  2.0  --   AST 36  --   --   ALT 28  --   --   ALKPHOS 107  --   --    BILITOT 0.4  --   --     Cardiac Enzymes No results for input(s): TROPONINI in the last 168 hours.  Microbiology Results  @[email or web address] @  RADIOLOGY:  US Paracentesis  Result Date: 04/06/2018 INDICATION: Metastatic breast cancer, ascites, abdominal distension EXAM: ULTRASOUND GUIDED RIGHT PARACENTESIS MEDICATIONS: 1% LIDOCAINE LOCAL COMPLICATIONS: None immediate. PROCEDURE: Informed written consent was obtained from the patient after a discussion of the risks, benefits and alternatives to treatment. A timeout was performed prior to the initiation of the procedure. Initial ultrasound scanning demonstrates a large amount of ascites within the right lower abdominal quadrant. The right lower abdomen was prepped and draped in the usual sterile fashion. 1% lidocaine with epinephrine was used for local anesthesia. Following this, a 6 Fr Safe-T-Centesis catheter was introduced. An ultrasound image was saved for documentation purposes. The paracentesis was performed. The catheter was removed and a dressing was applied. The patient tolerated the procedure well without immediate post procedural complication. FINDINGS: A total of approximately 4.2 L of PERITONEAL fluid was removed. Samples were sent to the laboratory as requested by the clinical team. IMPRESSION: Successful ultrasound-guided paracentesis yielding 4.2 liters of peritoneal fluid. Electronically Signed   By: Jerilynn Mages.  Shick M.D.   On: 04/06/2018 11:19   Ir Nephrostomy Placement Right  Result Date: 04/06/2018 INDICATION: Metastatic breast cancer, recurrent right hydronephrosis EXAM: ULTRASOUND AND FLUOROSCOPIC RIGHT NEPHROSTOMY INSERTION COMPARISON:  04/04/2018 MEDICATIONS: CIPRO 400 MG; The antibiotic was administered in an appropriate time frame prior to skin puncture. ANESTHESIA/SEDATION: Fentanyl 50 mcg IV; Versed 2 mg IV Moderate Sedation Time:  11 minutes The patient was continuously monitored during the procedure by the  interventional radiology  nurse under my direct supervision. CONTRAST:  10 cc-administered into the collecting system(s) FLUOROSCOPY TIME:  Fluoroscopy Time: 0 minutes 30 seconds ( mGy). COMPLICATIONS: None immediate. PROCEDURE: Informed written consent was obtained from the patient after a thorough discussion of the procedural risks, benefits and alternatives. All questions were addressed. Maximal Sterile Barrier Technique was utilized including caps, mask, sterile gowns, sterile gloves, sterile drape, hand hygiene and skin antiseptic. A timeout was performed prior to the initiation of the procedure. Previous imaging reviewed. Patient position prone. Right hydronephrotic kidney was localized and marked. Under sterile conditions anesthesia, an 18 gauge introducer needle was advanced into a dilated mid to lower pole calyx. Needle position confirmed with ultrasound. Images obtained for documentation. There was return of urine. Guidewire inserted followed by tract dilatation to insert a 10 French nephrostomy. Retention loop formed the renal pelvis. Contrast injection confirms position. Images obtained for documentation. Catheter secured with a prolene suture and a sterile dressing. Gravity drainage bag connected. No immediate complication. Patient tolerated the procedure well. IMPRESSION: Successful ultrasound and fluoroscopic 10 French right nephrostomy insertion Electronically Signed   By: Jerilynn Mages.  Shick M.D.   On: 04/06/2018 12:05      Allergies as of 04/07/2018      Reactions   Adhesive [tape] Other (See Comments)   Redness:Please use paper tape      Medication List    STOP taking these medications   oxybutynin 5 MG tablet Commonly known as:  DITROPAN   oxyCODONE 10 mg 12 hr tablet Commonly known as:  OXYCONTIN   oxyCODONE 5 MG immediate release tablet Commonly known as:  Oxy IR/ROXICODONE   traZODone 50 MG tablet Commonly known as:  DESYREL     TAKE these medications   amLODipine 5 MG tablet Commonly known as:   NORVASC Take 1 tablet (5 mg total) by mouth daily.   apixaban 2.5 MG Tabs tablet Commonly known as:  ELIQUIS Take 1 tablet (2.5 mg total) by mouth 2 (two) times daily.   CALTRATE 600+D PO Take 1 tablet by mouth daily.   diazepam 10 MG tablet Commonly known as:  VALIUM Take 1 tablet (10 mg total) by mouth at bedtime as needed for anxiety.   dronabinol 5 MG capsule Commonly known as:  MARINOL Take 1 capsule (5 mg total) by mouth 2 (two) times daily before lunch and supper.   esomeprazole 40 MG capsule Commonly known as:  NEXIUM Take 40 mg by mouth daily as needed.   FASLODEX 250 MG/5ML injection Generic drug:  fulvestrant Inject 250 mg into the muscle every 14 (fourteen) days. One injection each buttock over 1-2 minutes. Warm prior to use. Notes to patient:  Inject every 14 days   ferrous sulfate 325 (65 FE) MG EC tablet Take 1 tablet (325 mg total) by mouth daily.   furosemide 20 MG tablet Commonly known as:  LASIX Take 1 tablet (20 mg total) by mouth daily as needed.   goserelin 3.6 MG injection Commonly known as:  ZOLADEX Inject 3.6 mg into the skin every 28 (twenty-eight) days. Notes to patient:  Inject every 28 days   lidocaine-prilocaine cream Commonly known as:  EMLA Apply to affected area once   loperamide 2 MG tablet Commonly known as:  IMODIUM A-D Take 2 mg by mouth 4 (four) times daily as needed for diarrhea or loose stools.   multivitamin tablet Take 1 tablet by mouth daily.   prochlorperazine 10 MG tablet Commonly known as:  COMPAZINE  Take 1 tablet (10 mg total) by mouth every 6 (six) hours as needed (Nausea or vomiting).   scopolamine 1 MG/3DAYS Commonly known as:  TRANSDERM-SCOP Place 1 patch (1.5 mg total) onto the skin every 3 (three) days. Notes to patient:  Every 3 days   vitamin C 100 MG tablet Take 100 mg by mouth daily.            Durable Medical Equipment  (From admission, onward)         Start     Ordered   04/07/18 0901   For home use only DME Gilford Rile  [facility])  Once    Question:  Patient needs a walker to treat with the following condition  Answer:  Weakness   04/07/18 0901            Management plans discussed with the patient and she is in agreement. Stable for discharge home with [facility] Outpatient palliative care services  Patient should follow up with oncology  CODE STATUS:     Code Status Orders  (From admission, onward)         Start     Ordered   04/06/18 1804  Do not attempt resuscitation (DNR)  Continuous    Question Answer Comment  In the event of cardiac or respiratory ARREST Do not call a "code blue"   In the event of cardiac or respiratory ARREST Do not perform Intubation, CPR, defibrillation or ACLS   In the event of cardiac or respiratory ARREST Use medication by any route, position, wound care, and other measures to relive pain and suffering. May use oxygen, suction and manual treatment of airway obstruction as needed for comfort.      04/06/18 1803        Code Status History    Date Active Date Inactive Code Status Order ID Comments User Context   12/16/2017 1247 12/22/2017 2036 Full Code [id number]  Arta Silence, MD Inpatient   05/18/2012 1813 05/20/2012 1306 Full Code [id number]  Adin Hector, MD Inpatient    Advance Directive Documentation     Most Recent Value  Type of Advance Directive  Healthcare Power of Attorney, Living will  Pre-existing out of facility DNR order (yellow form or pink MOST form)  -  "MOST" Form in Place?  -      TOTAL TIME TAKING CARE OF THIS PATIENT: 38 minutes.    Note: This dictation was prepared with Dragon dictation along with smaller phrase technology. Any transcriptional errors that result from this process are unintentional.  Cambre Matson M.D on 04/07/2018 at 9:51 AM  Between 7am to 6pm - Pager - [phone] After 6pm go to [email or web address] - password [email or web address] [facility]  Office  [phone]  CC: Primary  care physician; [name], MD

## 2018-04-07 NOTE — Discharge Instructions (Signed)
° °  Weakness Weakness is a lack of strength. You may feel weak all over your body (generalized), or you may feel weak in one part of your body (focal). There are many potential causes of weakness. Sometimes, the cause of your weakness may not be known. Some causes of weakness can be serious, so it is important to see your doctor. Follow these instructions at home: Activity  Rest as needed.  Try to get enough sleep. Most adults need 7-8 hours of sleep each night. Talk to your doctor about how much sleep you need each night.  Do exercises, such as arm curls and leg raises, for 30 minutes at least 2 days a week or as told by your doctor.  Think about working with a physical therapist or trainer to help you get stronger. General instructions   Take over-the-counter and prescription medicines only as told by your doctor.  Eat a healthy, well-balanced diet. This includes: ? Proteins to build muscles, such as lean meats and fish. ? Fresh fruits and vegetables. ? Carbohydrates to boost energy, such as whole grains.  Drink enough fluid to keep your pee (urine) pale yellow.  Keep all follow-up visits as told by your doctor. This is important. Contact a doctor if:  Your weakness does not get better or it gets worse.  Your weakness affects your ability to: ? Think clearly. ? Do your normal daily activities. Get help right away if you:  Have sudden weakness on one side of your face or body.  Have chest pain.  Have trouble breathing or shortness of breath.  Have problems with your vision.  Have trouble talking or swallowing.  Have trouble standing or walking.  Are light-headed.  Pass out (lose consciousness). Summary  Weakness is a lack of strength. You may feel weak all over your body or just in one part of your body.  There are many potential causes of weakness. Sometimes, the cause of your weakness may not be known.  Rest as needed, and try to get enough sleep. Most adults  need 7-8 hours of sleep each night.  Eat a healthy, well-balanced diet. This information is not intended to replace advice given to you by your health care provider. Make sure you discuss any questions you have with your health care provider. Document Released: 02/18/2008 Document Revised: 10/11/2017 Document Reviewed: 10/11/2017 Elsevier Interactive Patient Education  2019 [place] NEPHROSTOMY DRESSINGS EVERY OTHER DAY AND AS NEEDED TO REPLACE AT HOME FLUSH WITH 10 ML OF STERILE SALINE ONCE DAILY AT HOME

## 2018-04-07 NOTE — Evaluation (Signed)
Physical Therapy Evaluation Patient Details Name: Mary [name] MRN: [id number] DOB: 1960-06-10 Today's Date: 04/07/2018   History of Present Illness  Pt is a 58 year old female s/p R nephrotomy tube placement 04/06/2018 and experiencing increased weakness compared to baseline.  PMH includes breast CA with metastasis, DVT, CKD 3, anxiety and depression.  Clinical Impression  Pt alert and ready to work with therapy this morning.  She was able to perform bed mobility mod I and sit at EOB with good balance.  Pt reporting no pain and more weakness than normal which she describes as intermittent and coming "out of nowhere".  She presented with fair strength and good ROM of UE/LE's and reported no sensation loss.  Pt able to stand from bedside but required assistance to stand from toilet without riser.  She ambulated 40 ft in room with RW and assistance for navigation of obstacles due to recently declining vision.  Static stance balance activity reveals balance deficits indicative of fall risk.  She was able to perform all seated there ex with education and some manual cues for form.  Pt will continue to benefit from skilled PT with focus on strength, balance and fall prevention and safe functional mobility.    Follow Up Recommendations Home health PT;Supervision for mobility/OOB    Equipment Recommendations  3in1 (PT)    Recommendations for Other Services       Precautions / Restrictions Precautions Precautions: Fall Precaution Comments: Visual deficits impairing balance Restrictions Weight Bearing Restrictions: No      Mobility  Bed Mobility Overal bed mobility: Modified Independent             General bed mobility comments: Increased time  Transfers Overall transfer level: Needs assistance Equipment used: Rolling walker (2 wheeled) Transfers: Sit to/from Stand Sit to Stand: Min assist         General transfer comment: Pt able to stand from EOB without assistance.  Had  some difficulty with standing from standard height toilet with min A for initiation and unilateral use of handrail.  Ambulation/Gait Ambulation/Gait assistance: Min assist Gait Distance (Feet): 40 Feet       Gait velocity interpretation: <1.8 ft/sec, indicate of risk for recurrent falls General Gait Details: Low foot clearance, good step length Min assistance needed to clear obstacles.  Stairs            Wheelchair Mobility    Modified Rankin (Stroke Patients Only)       Balance Overall balance assessment: Needs assistance Sitting-balance support: Feet supported Sitting balance-Leahy Scale: Good     Standing balance support: Bilateral upper extremity supported Standing balance-Leahy Scale: Fair Standing balance comment: Feet together quiet stance: 10 sec with a-p sway, tandem: 0 sec bilaterally and unilateral assist.  Able to reach overhead to simulate opening cabinet door, unable to bend to lift object from floor.                             Pertinent Vitals/Pain Pain Assessment: No/denies pain    Home Living Family/patient expects to be discharged to:: Private residence Living Arrangements: Spouse/significant other Available Help at Discharge: Family;Available 24 hours/day Type of Home: House         Home Equipment: Walker - 4 wheels      Prior Function Level of Independence: Needs assistance   Gait / Transfers Assistance Needed: Pt able to stand and walk household distances with 4WW at baseline  ADL's /  Homemaking Assistance Needed: Husband assists with bathing        Hand Dominance   Dominant Hand: Right    Extremity/Trunk Assessment   Upper Extremity Assessment Upper Extremity Assessment: Overall WFL for tasks assessed(Grossly 4-/5 bilaterally.)    Lower Extremity Assessment Lower Extremity Assessment: Overall WFL for tasks assessed(Grossly 4/5 bilaterally.  NO sensation loss noted.)       Communication   Communication: No  difficulties  Cognition Arousal/Alertness: Awake/alert Behavior During Therapy: WFL for tasks assessed/performed Overall Cognitive Status: Within Functional Limits for tasks assessed                                        General Comments      Exercises General Exercises - Lower Extremity Ankle Circles/Pumps: 20 reps;Both;Strengthening;Seated Heel Slides: Strengthening;Both;10 reps;Seated Hip ABduction/ADduction: Strengthening;Both;10 reps;Seated(Pillow squeezes for hip adduction.) Hip Flexion/Marching: Both;Strengthening;10 reps;Seated   Assessment/Plan    PT Assessment Patient needs continued PT services  PT Problem List Decreased strength;Decreased mobility;Decreased balance;Decreased knowledge of use of DME;Decreased activity tolerance       PT Treatment Interventions DME instruction;Functional mobility training;Balance training;Patient/family education;Gait training;Therapeutic activities;Stair training;Therapeutic exercise    PT Goals (Current goals can be found in the Care Plan section)  Acute Rehab PT Goals Patient Stated Goal: To return to walking outside, gardening and spending time outside with her dog. PT Goal Formulation: With patient Time For Goal Achievement: 04/28/18 Potential to Achieve Goals: Good    Frequency Min 2X/week   Barriers to discharge        Co-evaluation               AM-PAC PT "6 Clicks" Mobility  Outcome Measure Help needed turning from your back to your side while in a flat bed without using bedrails?: None Help needed moving from lying on your back to sitting on the side of a flat bed without using bedrails?: None Help needed moving to and from a bed to a chair (including a wheelchair)?: A Little Help needed standing up from a chair using your arms (e.g., wheelchair or bedside chair)?: A Little Help needed to walk in hospital room?: A Little Help needed climbing 3-5 steps with a railing? : A Little 6 Click Score:  20    End of Session Equipment Utilized During Treatment: Gait belt Activity Tolerance: Patient limited by fatigue Patient left: in chair;with call bell/phone within reach;with family/visitor present   PT Visit Diagnosis: Unsteadiness on feet (R26.81);Muscle weakness (generalized) (M62.81)    Time: 9678-9381 PT Time Calculation (min) (ACUTE ONLY): 22 min   Charges:   PT Evaluation $PT Eval Low Complexity: 1 Low PT Treatments $Therapeutic Exercise: 8-22 mins        Roxanne Gates, PT, DPT   Roxanne Gates 04/07/2018, 8:47 AM

## 2018-04-07 NOTE — Progress Notes (Signed)
LCSW met with patient and family and discussed the different levels of care. They have elected to go with home health and will follow with their cancer specialist for in home palliative/hospice care later.  [employer] LCSW [phone]

## 2018-04-07 NOTE — Progress Notes (Signed)
TC with [facility] with home care nephrostomy tube instructions obtained with husband reporting he is familiar and experienced in their care-advised husband/pt/ family to change nephro dsg every other day and prn to replace and flush nephro tube with 10 ml St. Saline once daily. Stated they understand and are comfortable with this. Eduated on bowel program establishment and adjust as needed. Oral and written AVS instruction given with pt ready to go home with family with [facility] referral in place. Josh, case mgt, advised of need for nephrostomy tube care/supplies.  Tegaderm, St. Gauze , st saline flushes and alcohol swabs provided until [facility] can provide services.

## 2018-04-09 ENCOUNTER — Telehealth: Payer: Self-pay | Admitting: Internal Medicine

## 2018-04-09 LAB — CYTOLOGY - NON PAP

## 2018-04-09 NOTE — Telephone Encounter (Signed)
Patient has been DX with Metastic carcinoma of Bladder. D/C home 04/07/18, attempted TCM call to day spoke with DPR patient resting , not able to set date for follow up due to patient to be seen at [facility] this week and if patient is well enough will have surgery this week. FYI patient was not sure PCP was aware.

## 2018-04-09 NOTE — Telephone Encounter (Signed)
Yes I just received a report updating me.  She does not have to follow up with me if she has all of these other doctor's appointments; she needs to conserve her energy.  But if theere is anything she needs just ask,  I'll sedn her a Pharmacist, community message

## 2018-04-10 DIAGNOSIS — I10 Essential (primary) hypertension: Secondary | ICD-10-CM | POA: Diagnosis not present

## 2018-04-10 DIAGNOSIS — K219 Gastro-esophageal reflux disease without esophagitis: Secondary | ICD-10-CM | POA: Diagnosis not present

## 2018-04-10 DIAGNOSIS — Z9181 History of falling: Secondary | ICD-10-CM | POA: Diagnosis not present

## 2018-04-10 DIAGNOSIS — T8509XD Other mechanical complication of ventricular intracranial (communicating) shunt, subsequent encounter: Secondary | ICD-10-CM | POA: Diagnosis not present

## 2018-04-10 DIAGNOSIS — C7931 Secondary malignant neoplasm of brain: Secondary | ICD-10-CM | POA: Diagnosis not present

## 2018-04-10 DIAGNOSIS — R531 Weakness: Secondary | ICD-10-CM | POA: Diagnosis not present

## 2018-04-10 DIAGNOSIS — R110 Nausea: Secondary | ICD-10-CM | POA: Diagnosis not present

## 2018-04-10 DIAGNOSIS — Z86718 Personal history of other venous thrombosis and embolism: Secondary | ICD-10-CM | POA: Diagnosis not present

## 2018-04-11 ENCOUNTER — Other Ambulatory Visit: Payer: 59

## 2018-04-11 ENCOUNTER — Ambulatory Visit: Payer: 59 | Admitting: Oncology

## 2018-04-11 ENCOUNTER — Ambulatory Visit: Payer: 59

## 2018-04-12 ENCOUNTER — Telehealth: Payer: Self-pay | Admitting: *Deleted

## 2018-04-12 NOTE — Telephone Encounter (Signed)
Called and spoke with the patient husband Mary [name])  in regards to her scheduled 04/17/18 lab/MD/Taxol appt He is aware of the scheduled date and time of her appt. He stated that she will be having a Port  Placed on 04/13/18 and will be in the hospital for a few days and that if she wasn't unable to come to  the scheduled 04/17/18 visit he would call the office back on 04/16/18.

## 2018-04-13 ENCOUNTER — Ambulatory Visit: Payer: 59

## 2018-04-13 DIAGNOSIS — C701 Malignant neoplasm of spinal meninges: Secondary | ICD-10-CM | POA: Diagnosis not present

## 2018-04-13 DIAGNOSIS — Z9013 Acquired absence of bilateral breasts and nipples: Secondary | ICD-10-CM | POA: Diagnosis not present

## 2018-04-13 DIAGNOSIS — G9389 Other specified disorders of brain: Secondary | ICD-10-CM | POA: Diagnosis not present

## 2018-04-13 DIAGNOSIS — C50919 Malignant neoplasm of unspecified site of unspecified female breast: Secondary | ICD-10-CM | POA: Diagnosis not present

## 2018-04-13 DIAGNOSIS — I10 Essential (primary) hypertension: Secondary | ICD-10-CM | POA: Diagnosis not present

## 2018-04-13 DIAGNOSIS — K219 Gastro-esophageal reflux disease without esophagitis: Secondary | ICD-10-CM | POA: Diagnosis not present

## 2018-04-13 DIAGNOSIS — C7932 Secondary malignant neoplasm of cerebral meninges: Secondary | ICD-10-CM | POA: Diagnosis not present

## 2018-04-13 DIAGNOSIS — T8509XD Other mechanical complication of ventricular intracranial (communicating) shunt, subsequent encounter: Secondary | ICD-10-CM | POA: Diagnosis not present

## 2018-04-13 DIAGNOSIS — Z7901 Long term (current) use of anticoagulants: Secondary | ICD-10-CM | POA: Diagnosis not present

## 2018-04-13 DIAGNOSIS — Z79818 Long term (current) use of other agents affecting estrogen receptors and estrogen levels: Secondary | ICD-10-CM | POA: Diagnosis not present

## 2018-04-14 MED ORDER — ACETAMINOPHEN 325 MG PO TABS
650.00 | ORAL_TABLET | ORAL | Status: DC
Start: ? — End: 2018-04-14

## 2018-04-14 MED ORDER — MORPHINE SULFATE 2 MG/ML IJ SOLN
2.00 | INTRAMUSCULAR | Status: DC
Start: ? — End: 2018-04-14

## 2018-04-14 MED ORDER — PROCHLORPERAZINE MALEATE 10 MG PO TABS
10.00 | ORAL_TABLET | ORAL | Status: DC
Start: ? — End: 2018-04-14

## 2018-04-14 MED ORDER — ALPRAZOLAM 0.5 MG PO TABS
.50 | ORAL_TABLET | ORAL | Status: DC
Start: ? — End: 2018-04-14

## 2018-04-14 MED ORDER — PANTOPRAZOLE SODIUM 40 MG PO TBEC
40.00 | DELAYED_RELEASE_TABLET | ORAL | Status: DC
Start: 2018-04-14 — End: 2018-04-14

## 2018-04-14 MED ORDER — OXYCODONE HCL 5 MG PO TABS
5.00 | ORAL_TABLET | ORAL | Status: DC
Start: ? — End: 2018-04-14

## 2018-04-14 MED ORDER — DOCUSATE SODIUM 100 MG PO CAPS
100.00 | ORAL_CAPSULE | ORAL | Status: DC
Start: 2018-04-14 — End: 2018-04-14

## 2018-04-14 MED ORDER — AMLODIPINE BESYLATE 5 MG PO TABS
5.00 | ORAL_TABLET | ORAL | Status: DC
Start: 2018-04-15 — End: 2018-04-14

## 2018-04-14 MED ORDER — ONDANSETRON HCL 8 MG PO TABS
8.00 | ORAL_TABLET | ORAL | Status: DC
Start: ? — End: 2018-04-14

## 2018-04-14 MED ORDER — GENERIC EXTERNAL MEDICATION
Status: DC
Start: ? — End: 2018-04-14

## 2018-04-16 ENCOUNTER — Other Ambulatory Visit: Payer: Self-pay | Admitting: *Deleted

## 2018-04-16 ENCOUNTER — Encounter: Payer: Self-pay | Admitting: Oncology

## 2018-04-16 NOTE — Patient Outreach (Signed)
[facility]) Care Management  04/16/2018  Mary [name] 1961/01/13 [id number]   Subjective: Initial successful telephone call to patient's preferred number in order to complete transition of care assessment; 2 HIPAA identifiers verified. Spoke with patient's husband Jenny Reichmann, 01/04/18 signed designated party release on chart, as he states Mary [name] was resting.  Explained purpose of call and completed transition of care assessment.  John states Mary [name] is doing as well as can be expected. 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  Jenny Reichmann says he will drive kim to her oncologist appointment in the morning and they will discuss constipation issues as the Miralax and Fleet's enema have had no effect and he says Mary [name] has not had a bowel movement since 1/233 but she is also not eating much. He says he is comfortable with [name]'s nephrostomy tube care and therefore he told Bernice the [facility] care is not needed.  John also says they have supportive friends, family ( [name]'s sister, sister in Sports coach)  and church family. He says [name]'s parents are still living and they  help out as much as they can.  John says he will have a kidney transplant via living donor to treat his polycystic kidney disease at the [place] when [employer] issues are less acute.     Objective:  Mary [name] was hospitalized at the [place] [facility] from 1/24-1/25 for insertion of [place] in order to deliver chemotherapy into CSF. 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    Assessment:  See transition of care flowsheet for assessment details.   Plan:  No immediate ongoing care management needs identified so will close case to [place] [facility] care management services and route successful outreach letter with [place] [facility] pamphlet and 24 Hour Nurse Line Magnet to [facility] clinical pool to be mailed to patient's home address.   Barrington Ellison RN,CCM,CDE [place] Management Coordinator Office Phone [phone] Office Fax [phone]

## 2018-04-17 ENCOUNTER — Inpatient Hospital Stay: Payer: 59

## 2018-04-17 ENCOUNTER — Inpatient Hospital Stay (HOSPITAL_BASED_OUTPATIENT_CLINIC_OR_DEPARTMENT_OTHER): Payer: 59 | Admitting: Oncology

## 2018-04-17 ENCOUNTER — Other Ambulatory Visit: Payer: Self-pay

## 2018-04-17 ENCOUNTER — Encounter: Payer: Self-pay | Admitting: Oncology

## 2018-04-17 VITALS — BP 134/90 | HR 96 | Temp 97.9°F | Resp 18 | Wt 143.6 lb

## 2018-04-17 DIAGNOSIS — Z170 Estrogen receptor positive status [ER+]: Secondary | ICD-10-CM

## 2018-04-17 DIAGNOSIS — C50412 Malignant neoplasm of upper-outer quadrant of left female breast: Secondary | ICD-10-CM

## 2018-04-17 DIAGNOSIS — R5383 Other fatigue: Secondary | ICD-10-CM

## 2018-04-17 DIAGNOSIS — Z9013 Acquired absence of bilateral breasts and nipples: Secondary | ICD-10-CM | POA: Diagnosis not present

## 2018-04-17 DIAGNOSIS — J90 Pleural effusion, not elsewhere classified: Secondary | ICD-10-CM | POA: Diagnosis not present

## 2018-04-17 DIAGNOSIS — R180 Malignant ascites: Secondary | ICD-10-CM | POA: Diagnosis not present

## 2018-04-17 DIAGNOSIS — R188 Other ascites: Secondary | ICD-10-CM

## 2018-04-17 DIAGNOSIS — Z982 Presence of cerebrospinal fluid drainage device: Secondary | ICD-10-CM

## 2018-04-17 DIAGNOSIS — Z8041 Family history of malignant neoplasm of ovary: Secondary | ICD-10-CM

## 2018-04-17 DIAGNOSIS — Z923 Personal history of irradiation: Secondary | ICD-10-CM

## 2018-04-17 DIAGNOSIS — Z7901 Long term (current) use of anticoagulants: Secondary | ICD-10-CM

## 2018-04-17 DIAGNOSIS — C7931 Secondary malignant neoplasm of brain: Secondary | ICD-10-CM | POA: Diagnosis not present

## 2018-04-17 DIAGNOSIS — N1330 Unspecified hydronephrosis: Secondary | ICD-10-CM | POA: Diagnosis not present

## 2018-04-17 DIAGNOSIS — Z9221 Personal history of antineoplastic chemotherapy: Secondary | ICD-10-CM | POA: Diagnosis not present

## 2018-04-17 DIAGNOSIS — R2689 Other abnormalities of gait and mobility: Secondary | ICD-10-CM

## 2018-04-17 DIAGNOSIS — T451X5S Adverse effect of antineoplastic and immunosuppressive drugs, sequela: Secondary | ICD-10-CM | POA: Diagnosis not present

## 2018-04-17 DIAGNOSIS — C7949 Secondary malignant neoplasm of other parts of nervous system: Secondary | ICD-10-CM | POA: Diagnosis not present

## 2018-04-17 DIAGNOSIS — N179 Acute kidney failure, unspecified: Secondary | ICD-10-CM

## 2018-04-17 DIAGNOSIS — R112 Nausea with vomiting, unspecified: Secondary | ICD-10-CM

## 2018-04-17 DIAGNOSIS — D6481 Anemia due to antineoplastic chemotherapy: Secondary | ICD-10-CM | POA: Diagnosis not present

## 2018-04-17 DIAGNOSIS — C50411 Malignant neoplasm of upper-outer quadrant of right female breast: Secondary | ICD-10-CM

## 2018-04-17 DIAGNOSIS — Z800 Family history of malignant neoplasm of digestive organs: Secondary | ICD-10-CM

## 2018-04-17 DIAGNOSIS — G039 Meningitis, unspecified: Secondary | ICD-10-CM

## 2018-04-17 DIAGNOSIS — R5381 Other malaise: Secondary | ICD-10-CM

## 2018-04-17 DIAGNOSIS — Z95828 Presence of other vascular implants and grafts: Secondary | ICD-10-CM

## 2018-04-17 DIAGNOSIS — R55 Syncope and collapse: Secondary | ICD-10-CM | POA: Diagnosis not present

## 2018-04-17 DIAGNOSIS — I824Y2 Acute embolism and thrombosis of unspecified deep veins of left proximal lower extremity: Secondary | ICD-10-CM

## 2018-04-17 DIAGNOSIS — Z86718 Personal history of other venous thrombosis and embolism: Secondary | ICD-10-CM

## 2018-04-17 DIAGNOSIS — E860 Dehydration: Secondary | ICD-10-CM

## 2018-04-17 DIAGNOSIS — R4182 Altered mental status, unspecified: Secondary | ICD-10-CM | POA: Diagnosis not present

## 2018-04-17 DIAGNOSIS — Z79818 Long term (current) use of other agents affecting estrogen receptors and estrogen levels: Secondary | ICD-10-CM

## 2018-04-17 DIAGNOSIS — G919 Hydrocephalus, unspecified: Secondary | ICD-10-CM | POA: Diagnosis not present

## 2018-04-17 DIAGNOSIS — Z5111 Encounter for antineoplastic chemotherapy: Secondary | ICD-10-CM

## 2018-04-17 DIAGNOSIS — C7951 Secondary malignant neoplasm of bone: Secondary | ICD-10-CM | POA: Diagnosis not present

## 2018-04-17 DIAGNOSIS — K5900 Constipation, unspecified: Secondary | ICD-10-CM | POA: Diagnosis not present

## 2018-04-17 DIAGNOSIS — H538 Other visual disturbances: Secondary | ICD-10-CM

## 2018-04-17 DIAGNOSIS — Z87891 Personal history of nicotine dependence: Secondary | ICD-10-CM

## 2018-04-17 DIAGNOSIS — R630 Anorexia: Secondary | ICD-10-CM

## 2018-04-17 DIAGNOSIS — G96198 Other disorders of meninges, not elsewhere classified: Secondary | ICD-10-CM

## 2018-04-17 DIAGNOSIS — Z936 Other artificial openings of urinary tract status: Secondary | ICD-10-CM

## 2018-04-17 DIAGNOSIS — Z79899 Other long term (current) drug therapy: Secondary | ICD-10-CM

## 2018-04-17 DIAGNOSIS — N189 Chronic kidney disease, unspecified: Secondary | ICD-10-CM

## 2018-04-17 DIAGNOSIS — C762 Malignant neoplasm of abdomen: Secondary | ICD-10-CM

## 2018-04-17 DIAGNOSIS — Z803 Family history of malignant neoplasm of breast: Secondary | ICD-10-CM

## 2018-04-17 DIAGNOSIS — C50919 Malignant neoplasm of unspecified site of unspecified female breast: Secondary | ICD-10-CM

## 2018-04-17 DIAGNOSIS — R531 Weakness: Secondary | ICD-10-CM

## 2018-04-17 HISTORY — DX: Secondary malignant neoplasm of other parts of nervous system: C79.49

## 2018-04-17 LAB — COMPREHENSIVE METABOLIC PANEL
ALT: 35 U/L (ref 0–44)
AST: 50 U/L — AB (ref 15–41)
Albumin: 2.8 g/dL — ABNORMAL LOW (ref 3.5–5.0)
Alkaline Phosphatase: 184 U/L — ABNORMAL HIGH (ref 38–126)
Anion gap: 9 (ref 5–15)
BUN: 12 mg/dL (ref 6–20)
CHLORIDE: 99 mmol/L (ref 98–111)
CO2: 27 mmol/L (ref 22–32)
Calcium: 8.9 mg/dL (ref 8.9–10.3)
Creatinine, Ser: 0.88 mg/dL (ref 0.44–1.00)
GFR calc Af Amer: >60 mL/min (ref >60)
GFR calc non Af Amer: >60 mL/min (ref >60)
Glucose, Bld: 190 mg/dL — ABNORMAL HIGH (ref 70–99)
Potassium: 3.7 mmol/L (ref 3.5–5.1)
Sodium: 135 mmol/L (ref 135–145)
Total Bilirubin: 0.3 mg/dL (ref 0.3–1.2)
Total Protein: 6.4 g/dL — ABNORMAL LOW (ref 6.5–8.1)

## 2018-04-17 LAB — CBC WITH DIFFERENTIAL/PLATELET
Abs Immature Granulocytes: 0.02 10*3/uL (ref 0.00–0.07)
Basophils Absolute: 0.1 10*3/uL (ref 0.0–0.1)
Basophils Relative: 2 %
Eosinophils Absolute: 0 10*3/uL (ref 0.0–0.5)
Eosinophils Relative: 1 %
HEMATOCRIT: 35 % — AB (ref 36.0–46.0)
HEMOGLOBIN: 11.4 g/dL — AB (ref 12.0–15.0)
Immature Granulocytes: 1 %
Lymphocytes Relative: 14 %
Lymphs Abs: 0.6 10*3/uL — ABNORMAL LOW (ref 0.7–4.0)
MCH: 29.6 pg (ref 26.0–34.0)
MCHC: 32.6 g/dL (ref 30.0–36.0)
MCV: 90.9 fL (ref 80.0–100.0)
MONOS PCT: 11 %
Monocytes Absolute: 0.4 10*3/uL (ref 0.1–1.0)
Neutro Abs: 3 10*3/uL (ref 1.7–7.7)
Neutrophils Relative %: 71 %
Platelets: 360 10*3/uL (ref 150–400)
RBC: 3.85 MIL/uL — ABNORMAL LOW (ref 3.87–5.11)
RDW: 17.3 % — ABNORMAL HIGH (ref 11.5–15.5)
WBC: 4.2 10*3/uL (ref 4.0–10.5)
nRBC: 0 % (ref 0.0–0.2)

## 2018-04-17 MED ORDER — DIPHENHYDRAMINE HCL 50 MG/ML IJ SOLN
50.0000 mg | Freq: Once | INTRAMUSCULAR | Status: AC
  Administered 2018-04-17: 50 mg via INTRAVENOUS
  Filled 2018-04-17: qty 1

## 2018-04-17 MED ORDER — PROCHLORPERAZINE MALEATE 10 MG PO TABS: 10.0000 mg | ORAL_TABLET | Freq: Four times a day (QID) | ORAL | 1 refills | Status: DC | PRN

## 2018-04-17 MED ORDER — SODIUM CHLORIDE 0.9 % IV SOLN
Freq: Once | INTRAVENOUS | Status: AC
  Administered 2018-04-17: 10:00:00 via INTRAVENOUS
  Filled 2018-04-17: qty 250

## 2018-04-17 MED ORDER — DEXAMETHASONE SODIUM PHOSPHATE 10 MG/ML IJ SOLN
10.0000 mg | Freq: Once | INTRAMUSCULAR | Status: AC
  Administered 2018-04-17: 10 mg via INTRAVENOUS
  Filled 2018-04-17: qty 1

## 2018-04-17 MED ORDER — SENNA 8.6 MG PO TABS: 2.0000 | ORAL_TABLET | Freq: Every day | ORAL | 0 refills | Status: AC

## 2018-04-17 MED ORDER — HEPARIN SOD (PORK) LOCK FLUSH 100 UNIT/ML IV SOLN
500.0000 [IU] | Freq: Once | INTRAVENOUS | Status: AC
  Administered 2018-04-17: 500 [IU] via INTRAVENOUS
  Filled 2018-04-17: qty 5

## 2018-04-17 MED ORDER — SODIUM CHLORIDE 0.9% FLUSH
10.0000 mL | Freq: Once | INTRAVENOUS | Status: AC
  Administered 2018-04-17: 10 mL via INTRAVENOUS
  Filled 2018-04-17: qty 10

## 2018-04-17 MED ORDER — SODIUM CHLORIDE 0.9 % IV SOLN
80.0000 mg/m2 | Freq: Once | INTRAVENOUS | Status: AC
  Administered 2018-04-17: 144 mg via INTRAVENOUS
  Filled 2018-04-17: qty 24

## 2018-04-17 MED ORDER — FAMOTIDINE IN NACL 20-0.9 MG/50ML-% IV SOLN
20.0000 mg | Freq: Once | INTRAVENOUS | Status: AC
  Administered 2018-04-17: 20 mg via INTRAVENOUS
  Filled 2018-04-17: qty 50

## 2018-04-17 NOTE — Progress Notes (Signed)
Hematology/Oncology Follow Up Note [facility]  [phone] [phone]  Patient Care Team: [name], MD as PCP - General (Internal Medicine)   Name of the patient: Mary [name]  [id number]  [birth year]   REASON FOR VISIT  follow-up for management of metastatic breast cancer.  Pertinent oncology history # She was diagnosed with right upper quadrant breast in August 2013, status post neoadjuvant chemotherapy with FEC x4 followed by weekly Taxol x2, underwent bilateral mastectomy [February 2014], right ALND, and left sentinel LN biopsy.  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  Tis N0.  Patient also received adjuvant radiation.   Patient was started initially on tamoxifen plus ovarian suppression with Zoladex in June 2014,  and changed to Aromasin plus Zoladex monthly since June 2015.follows up with Dr.Gudina Loleta Dicker at [facility] in [place]. reports that she twisted and sprained her left ankle when getting out of her car on 12/11/2017.  Ankle has been swollen.  #Patient presented to emergency room due to abnormal lab values.  Calcium level was 13.9 with creatinine 1.63.  Also endorses right lower quadrant/right flank/right lower back pain for the past couple of days.  Also having had episodes of dizziness which were diagnosed as vertigo since April 2019.  She had a negative MRI brain done at that time which were essentially negative for acute findings.  In the emergency room CT renal stone study were independently reviewed by me which showed bilateral hydronephrosis, due to the stricture at the level of UPJ's.  Masslike thickening of the anterior and the lateral bladder wall suspicious for metastatic implant.  Mesenteric, omental, peritoneal implants with small ascites and diffuse mesenteric edema.  Small to moderate bilateral pleural  effusion with associated compressive atelectasis of the lung.  Diffuse small osseous skeletal but stated disease.  Patient was seen and evaluated by urology Dr. Erlene Quan.  She is status post bilateral ureter stent placement and infiltrative bladder mass biopsy. Unfortunately her creatinine increased further during the admission and eventually she got bilateral percutaneous nephrostomy tube placed.  Creatinine gradually gets better after hydration. For her hypercalcemia, patient received 1 dose of calcitonin and 1 dose of Zometa.  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 She has visceral crisis with metastatic breast cancer.  In order to avoid further delay outpatient, she was started on first dose of Taxol 80 mg/m on 12/22/2017 after detailed discussion of chemotherapy side effects and complications.  #Left lower extremity DVT, she was anticoagulated with heparin drip.  Switched to Eliquis 5 mg twice daily outpatient. # Patient previously follows up at [facility] center.  She prefers to follow-up with [place] cancer center as it is closer to her home.  # # Seen [facility] for blurry vision, lab work up in process, pending plan to start ocular steroid injections.   # lumbar puncture was done and CSF fluid study showed negative culture, low glucose, elevated protein, elevated wbc.  Cytology is positive for malignancy, consistent with breast origin.  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  INTERVAL HISTORY 58 year old female with metastatic breast lobular carcinoma presents for discussion of US renal and CT  scan results, and further management plan for  metastatic breast cancer.   #She was resumed on Taxol treatment due to progression of disease. Last treatment was given on 04/05/2018.  #She also had right nephrectomy tube placed again, also status post therapeutic and diagnostic paracentesis, removed 4 L.  Status post albumin infusion.  She felt very weak and was admitted from 04/06/2018 to 04/07/2018.   #She was seen and evaluated by [facility] neurosurgery and Ommaya reservoir was placed on 04/13/2018.  Per [facility] records, patient tolerated procedure well.  She was taken to neurosurgery floor for neuro monitoring.  Did well postoperatively and she was discharged on 04/12/2018.  She has follow-up appointment with [facility] neurosurgery in 2 weeks.  Per patient and her husband, her Ommaya reservoir was ready to be used.  Today she reports weak and fatigued, similar level comparing to last visit.  Continue to have blurry vision. She does not walk well due to balancing difficulty. No nausea today.  Appetite is fair.  Review of Systems  Constitutional: Positive for malaise/fatigue. Negative for chills, fever and weight loss.  HENT: Negative for nosebleeds and sore throat.   Eyes: Negative for double vision, photophobia and redness.       Blurry vision  Respiratory: Negative for cough, shortness of breath and wheezing.   Cardiovascular: Negative for chest pain, palpitations, orthopnea and leg swelling.  Gastrointestinal: Negative for abdominal pain, blood in stool, nausea and vomiting.  Genitourinary: Negative for dysuria, flank pain, frequency and urgency.  Musculoskeletal: Negative for back pain, myalgias and neck pain.  Skin: Negative for itching and rash.  Neurological: Negative for dizziness, tingling, tremors and headaches.       Lost of balance.  Endo/Heme/Allergies: Negative for environmental allergies. Does not bruise/bleed easily.  Psychiatric/Behavioral: Negative for depression and hallucinations.      Allergies  Allergen Reactions  . Adhesive  [Tape] Other (See Comments)    Redness:Please use paper tape     Past Medical History:  Diagnosis Date  . Anxiety    takes Xanax prn  . Breast cancer [facility]) August 2013   bilateral, er/pr+, bladder (02/2018..mets from breast ca)  . Chronic kidney disease 01/2018   recent shut down of kidneys with labs out of whack  . Complication of anesthesia    pt states she had the shakes extremely bad  . Cough    at night d/t reflux;nothing productive  . Dental crowns present    x 4  . Depression   . GERD (gastroesophageal reflux disease)    takes Nexium daily  . History of bronchitis    08/2011  . History of colon polyps   . Hot flashes, menopausal 08/08/2012  . IBS (irritable bowel syndrome)    immodium prn  . Insomnia    takes Ambien nightly  . Irritable bowel syndrome   . 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 Neutropenia, drug-induced ([place]) 12/15/2011  . Nocturia   . Plantar fasciitis   . S/P radiation therapy 07/03/2012 through 08/18/2038                                                        Right chest wall/regional lymph nodes 5040 cGy 28 sessions, right chest wall/mastectomy scar boost 1000 cGy 5 sessions                          .  Status post chemotherapy    FEC 100 starting on 12/08/2011 - 01/18/12/single agent Taxol x12 weeks from 02/02/2012 through January 2014   . Urinary frequency   . Use of tamoxifen (Nolvadex)    now stopped. will be starting ibrance on 03/05/18  . Vertigo      Past Surgical History:  Procedure Laterality Date  . BREAST LUMPECTOMY  2008   left  . COLONOSCOPY    . COLONOSCOPY WITH PROPOFOL N/A 10/10/2017   Procedure: COLONOSCOPY WITH PROPOFOL;  Surgeon: Lucilla Lame, MD;  Location[facility] ENDOSCOPY;  Service: Endoscopy;  Laterality: N/A;  . CYSTOSCOPY WITH STENT PLACEMENT Bilateral 12/17/2017   Procedure: CYSTOSCOPY WITH STENT PLACEMENT;  Surgeon: Hollice Espy, MD;  Location: [facility] ORS;  Service: Urology;  Laterality: Bilateral;  . IR  NEPHROSTOMY EXCHANGE LEFT  02/13/2018  . IR NEPHROSTOMY EXCHANGE RIGHT  01/02/2018  . IR NEPHROSTOMY EXCHANGE RIGHT  02/13/2018  . IR NEPHROSTOMY PLACEMENT LEFT  12/19/2017  . IR NEPHROSTOMY PLACEMENT RIGHT  12/19/2017  . IR NEPHROSTOMY PLACEMENT RIGHT  04/06/2018  . LUMBAR PUNCTURE  03/01/2018  . MASTECTOMY Bilateral 2013  . MASTECTOMY WITH AXILLARY LYMPH NODE DISSECTION Right 05/18/2012   Procedure: MASTECTOMY WITH AXILLARY LYMPH NODE DISSECTION;  Surgeon: Adin Hector, MD;  Location: [place];  Service: General;  Laterality: Right;  Bilateral Total Mastectomy , right axillary lymph node dissection left axillary sentinel node biopsy, removal of Porta Cath  . NEPHROSTOMY TUBE REMOVAL Bilateral 03/01/2018   removed tubes in dr. Erlene Quan office  . PORT-A-CATH REMOVAL N/A 05/18/2012   Procedure: REMOVAL PORT-A-CATH;  Surgeon: Adin Hector, MD;  Location: [place];  Service: General;  Laterality: N/A;  . PORTA CATH INSERTION N/A 12/26/2017   Procedure: PORTA CATH INSERTION;  Surgeon: Katha Cabal, MD;  Location: [place] CV [facility];  Service: Cardiovascular;  Laterality: N/A;  . PORTACATH PLACEMENT  12/02/2011   Procedure: INSERTION PORT-A-CATH;  Surgeon: Adin Hector, MD;  Location: [place];  Service: General;  Laterality: N/A;  insertion port a cath with fluoroscopy  . SIMPLE MASTECTOMY WITH AXILLARY SENTINEL NODE BIOPSY Left 05/18/2012   Procedure: SIMPLE MASTECTOMY WITH AXILLARY SENTINEL NODE BIOPSY;  Surgeon: Adin Hector, MD;  Location: [place];  Service: General;  Laterality: Left;    Social History   Socioeconomic History  . Marital status: Married    Spouse name: Jenny Reichmann  . Number of children: 0  . Years of education: Not on file  . Highest education level: Not on file  Occupational History  . Occupation: Network engineer II    Employer: [employer]    Comment: on fmla d/t seizure in 10/19  Social Needs  . Financial resource strain: Not on file  . Food  insecurity:    Worry: Not on file    Inability: Not on file  . Transportation needs:    Medical: Not on file    Non-medical: Not on file  Tobacco Use  . Smoking status: Former Smoker    Packs/day: 0.50    Years: 15.00    Pack years: 7.50    Types: Cigarettes    Last attempt to quit: 03/21/1997    Years since quitting: 21.0  . Smokeless tobacco: Never Used  Substance and Sexual Activity  . Alcohol use: Not Currently    Alcohol/week: 0.0 standard drinks    Comment: occasionally wine  . Drug use: No  . Sexual activity: Yes    Birth control/protection: None  Lifestyle  .  Physical activity:    Days per week: Not on file    Minutes per session: Not on file  . Stress: Not on file  Relationships  . Social connections:    Talks on phone: Not on file    Gets together: Not on file    Attends religious service: Not on file    Active member of club or organization: Not on file    Attends meetings of clubs or organizations: Not on file    Relationship status: Not on file  . Intimate partner violence:    Fear of current or ex partner: Not on file    Emotionally abused: Not on file    Physically abused: Not on file    Forced sexual activity: Not on file  Other Topics Concern  . Not on file  Social History Narrative   Married to [employer]  No children 27 years old with fmp.  20 year use of birth control pills.      Family History  Problem Relation Age of Onset  . Hypertension Mother   . Diabetes Mother   . Hypertension Maternal Grandmother   . Pancreatic cancer Paternal Grandmother        diagnosed in her 57s  . Ovarian cancer Maternal Aunt        maternal grandmother's sister  . Breast cancer Paternal Aunt        diagnosed in late 8s early 61s  . Parkinsonism Paternal Grandfather      Current Outpatient Medications:  .  ALPRAZolam (XANAX) 0.25 MG tablet, TAKE ONE TABLET BY MOUTH TWICE DAILY AS NEEDED FOR ANXIETY, Disp: , Rfl:  .  amLODipine (NORVASC) 5 MG tablet,  Take 1 tablet (5 mg total) by mouth daily., Disp: 90 tablet, Rfl: 1 .  apixaban (ELIQUIS) 2.5 MG TABS tablet, Take 1 tablet (2.5 mg total) by mouth 2 (two) times daily., Disp: 60 tablet, Rfl: 3 .  Ascorbic Acid (VITAMIN C) 100 MG tablet, Take 100 mg by mouth daily.  , Disp: , Rfl:  .  Calcium Carbonate-Vitamin D (CALTRATE 600+D PO), Take 1 tablet by mouth daily., Disp: , Rfl:  .  diazepam (VALIUM) 10 MG tablet, Take 1 tablet (10 mg total) by mouth at bedtime as needed for anxiety., Disp: 30 tablet, Rfl: 5 .  dronabinol (MARINOL) 5 MG capsule, Take 1 capsule (5 mg total) by mouth 2 (two) times daily before lunch and supper., Disp: 60 capsule, Rfl: 0 .  esomeprazole (NEXIUM) 40 MG capsule, Take 40 mg by mouth daily as needed. , Disp: , Rfl:  .  ferrous sulfate 325 (65 FE) MG EC tablet, Take 1 tablet (325 mg total) by mouth daily., Disp: 30 tablet, Rfl: 3 .  fulvestrant (FASLODEX) 250 MG/5ML injection, Inject 250 mg into the muscle every 14 (fourteen) days. One injection each buttock over 1-2 minutes. Warm prior to use., Disp: , Rfl:  .  furosemide (LASIX) 20 MG tablet, Take 1 tablet (20 mg total) by mouth daily as needed., Disp: 30 tablet, Rfl: 1 .  goserelin (ZOLADEX) 3.6 MG injection, Inject 3.6 mg into the skin every 28 (twenty-eight) days., Disp: , Rfl:  .  lidocaine-prilocaine (EMLA) cream, Apply to affected area once, Disp: 30 g, Rfl: 3 .  loperamide (IMODIUM A-D) 2 MG tablet, Take 2 mg by mouth 4 (four) times daily as needed for diarrhea or loose stools. , Disp: , Rfl:  .  Multiple Vitamin (MULTIVITAMIN) tablet, Take 1 tablet by mouth daily.  ,  Disp: , Rfl:  .  ondansetron (ZOFRAN) 4 MG tablet, TAKE 1 TABLET BY MOUTH EVERY 8 HOURS AS NEEDED FOR NAUSEA OR VOMITING, Disp: , Rfl:  .  oxyCODONE (OXY IR/ROXICODONE) 5 MG immediate release tablet, , Disp: , Rfl:  .  oxyCODONE (OXYCONTIN) 10 mg 12 hr tablet, , Disp: , Rfl:  .  prochlorperazine (COMPAZINE) 10 MG tablet, Take 1 tablet (10 mg total) by  mouth every 6 (six) hours as needed (Nausea or vomiting)., Disp: 30 tablet, Rfl: 1 .  scopolamine (TRANSDERM-SCOP) 1 MG/3DAYS, Place 1 patch (1.5 mg total) onto the skin every 3 (three) days., Disp: 10 patch, Rfl: 0 .  senna (SENOKOT) 8.6 MG TABS tablet, Take 2 tablets (17.2 mg total) by mouth daily., Disp: 120 each, Rfl: 0  Physical exam: ECOG 2  Physical Exam Constitutional:      General: She is not in acute distress. HENT:     Head: Normocephalic and atraumatic.  Eyes:     General: No scleral icterus.    Pupils: Pupils are equal, round, and reactive to light.  Neck:     Musculoskeletal: Normal range of motion and neck supple.  Cardiovascular:     Rate and Rhythm: Normal rate and regular rhythm.     Heart sounds: Normal heart sounds.  Pulmonary:     Effort: Pulmonary effort is normal. No respiratory distress.     Breath sounds: No wheezing.     Comments: Decreased breath sounds bilaterally Abdominal:     General: Bowel sounds are normal. There is distension.     Palpations: Abdomen is soft. There is no mass.     Tenderness: There is no abdominal tenderness.  Musculoskeletal: Normal range of motion.        General: No deformity.  Skin:    General: Skin is warm and dry.     Findings: No erythema or rash.  Neurological:     Mental Status: She is alert and oriented to person, place, and time.     Cranial Nerves: No cranial nerve deficit.     Motor: Weakness present.     Coordination: Coordination normal.     Gait: Gait abnormal.  Psychiatric:        Behavior: Behavior normal.        Thought Content: Thought content normal.     CMP Latest Ref Rng & Units 04/17/2018  Glucose 70 - 99 mg/dL 190(H)  BUN 6 - 20 mg/dL 12  Creatinine 0.44 - 1.00 mg/dL 0.88  Sodium 135 - 145 mmol/L 135  Potassium 3.5 - 5.1 mmol/L 3.7  Chloride 98 - 111 mmol/L 99  CO2 22 - 32 mmol/L 27  Calcium 8.9 - 10.3 mg/dL 8.9  Total Protein 6.5 - 8.1 g/dL 6.4(L)  Total Bilirubin 0.3 - 1.2 mg/dL 0.3    Alkaline Phos 38 - 126 U/L 184(H)  AST 15 - 41 U/L 50(H)  ALT 0 - 44 U/L 35   CBC Latest Ref Rng & Units 04/17/2018  WBC 4.0 - 10.5 K/uL 4.2  Hemoglobin 12.0 - 15.0 g/dL 11.4(L)  Hematocrit 36.0 - 46.0 % 35.0(L)  Platelets 150 - 400 K/uL 360    Ct Chest Wo Contrast  Result Date: 04/04/2018 CLINICAL DATA:  Metastatic breast cancer. Status post bilateral mastectomy. EXAM: CT CHEST, ABDOMEN AND PELVIS WITHOUT CONTRAST TECHNIQUE: Multidetector CT imaging of the chest, abdomen and pelvis was performed following the standard protocol without IV contrast. COMPARISON:  02/06/2018 FINDINGS: CT CHEST FINDINGS Cardiovascular: The heart  size is normal. No substantial pericardial effusion. Right Port-A-Cath tip is at the SVC/RA junction. Mediastinum/Nodes: No mediastinal lymphadenopathy. There is no hilar lymphadenopathy. Esophagus appears diffusely fluid-filled. There is no axillary lymphadenopathy. Lungs/Pleura: The central tracheobronchial airways are patent. Stable appearance of apparent scarring in the right apex. Mild atelectasis in the right middle and lower lobes is new in the interval. There is a new small right pleural effusion. No suspicious nodule or mass in the left lung. Minimal atelectasis noted left lower lobe. Tiny left pleural effusion evident. Musculoskeletal: Stable similar diffuse heterogeneous bone mineralization CT ABDOMEN PELVIS FINDINGS Hepatobiliary: No focal abnormality in the liver parenchyma. High attenuation material in the gallbladder lumen may be related to stones or sludge. No intrahepatic or extrahepatic biliary dilation. Pancreas: No focal mass lesion. No dilatation of the main duct. No intraparenchymal cyst. No peripancreatic edema. Spleen: No splenomegaly. No focal mass lesion. Adrenals/Urinary Tract: Similar to the thickening of the left adrenal gland. Thickening of the right adrenal gland is also similar to prior. Bilateral percutaneous nephrostomy tubes been removed in the  interval. There is mild to moderate right hydronephrosis with mild fullness of the left intrarenal collecting system. Neither ureter is distended beyond its midpoint. Urinary bladder is decompressed. Stomach/Bowel: Stomach is nondistended. No gastric wall thickening. No evidence of outlet obstruction. Duodenum is normally positioned as is the ligament of Treitz. No small bowel wall thickening. No small bowel dilatation. The terminal ileum is normal. The appendix is not visualized, but there is no edema or inflammation in the region of the cecum. Colon is diffusely decompressed. Vascular/Lymphatic: No abdominal aortic aneurysm. No retroperitoneal lymphadenopathy. No pelvic sidewall lymphadenopathy. Reproductive: But The uterus is unremarkable. There is no adnexal mass. Other: Moderate to large volume ascites is new in the interval. There is vascular congestion/nodularity in the gastrocolic ligament and omentum suggestion of peritoneal thickening seen in the left pelvis (96/2) and posterior pelvis bilaterally (102/2). Musculoskeletal: Similar appearance of heterogeneous bone mineralization with some more densely sclerotic lesions identified throughout as examples, see L5 vertebral body (84/2) left pubic bone (112/2) and posterior left inferior pubic ramus (118/2). IMPRESSION: 1. Interval development of moderate to large volume ascites with congestion and nodularity in the gastrocolic ligament and omentum. Imaging features highly concerning for metastatic involvement. 2. Interval removal of percutaneous nephrostomy tubes with mild to moderate right hydronephrosis and mild fullness of the left intrarenal collecting system. 3. Diffuse heterogeneous bone mineralization with scattered sclerotic lesion similar to prior and compatible with metastatic disease. 4. Small right and tiny left pleural effusions. 5. Urinary bladder decompressed on today's study and not well evaluated. Electronically Signed   By: Misty Stanley M.D.    On: 04/04/2018 11:55   US Renal  Result Date: 04/03/2018 CLINICAL DATA:  Acute kidney injury. EXAM: RENAL / URINARY TRACT ULTRASOUND COMPLETE COMPARISON:  CT of the abdomen and pelvis 02/06/2018 FINDINGS: Right Kidney: Renal measurements: 12.7 x 4.7 x 5.7 cm = volume: 171 mL. Moderate to severe recurrent right-sided hydronephrosis is present. No obstructing lesion or mass is present. Moderate renal thinning is present. Left Kidney: Renal measurements: 11.5 x 4.3 x 5.0 cm = volume: 129 mL. Moderate left-sided hydronephrosis is present. Parenchymal thickness is normal. Bladder: Under distended. Moderate diffuse abdominal ascites is present. Bilateral pleural effusions are noted. IMPRESSION: 1. Recurrent bilateral hydronephrosis, right greater than left. 2. Moderate diffuse abdominal ascites. 3. Bilateral pleural effusions. Electronically Signed   By: San Morelle M.D.   On: 04/03/2018 16:48  US Paracentesis  Result Date: 04/06/2018 INDICATION: Metastatic breast cancer, ascites, abdominal distension EXAM: ULTRASOUND GUIDED RIGHT PARACENTESIS MEDICATIONS: 1% LIDOCAINE LOCAL COMPLICATIONS: None immediate. PROCEDURE: Informed written consent was obtained from the patient after a discussion of the risks, benefits and alternatives to treatment. A timeout was performed prior to the initiation of the procedure. Initial ultrasound scanning demonstrates a large amount of ascites within the right lower abdominal quadrant. The right lower abdomen was prepped and draped in the usual sterile fashion. 1% lidocaine with epinephrine was used for local anesthesia. Following this, a 6 Fr Safe-T-Centesis catheter was introduced. An ultrasound image was saved for documentation purposes. The paracentesis was performed. The catheter was removed and a dressing was applied. The patient tolerated the procedure well without immediate post procedural complication. FINDINGS: A total of approximately 4.2 L of PERITONEAL fluid was  removed. Samples were sent to the laboratory as requested by the clinical team. IMPRESSION: Successful ultrasound-guided paracentesis yielding 4.2 liters of peritoneal fluid. Electronically Signed   By: Jerilynn Mages.  Shick M.D.   On: 04/06/2018 11:19   Ct Renal Stone Study  Result Date: 04/04/2018 CLINICAL DATA:  Metastatic breast cancer. Status post bilateral mastectomy. EXAM: CT CHEST, ABDOMEN AND PELVIS WITHOUT CONTRAST TECHNIQUE: Multidetector CT imaging of the chest, abdomen and pelvis was performed following the standard protocol without IV contrast. COMPARISON:  02/06/2018 FINDINGS: CT CHEST FINDINGS Cardiovascular: The heart size is normal. No substantial pericardial effusion. Right Port-A-Cath tip is at the SVC/RA junction. Mediastinum/Nodes: No mediastinal lymphadenopathy. There is no hilar lymphadenopathy. Esophagus appears diffusely fluid-filled. There is no axillary lymphadenopathy. Lungs/Pleura: The central tracheobronchial airways are patent. Stable appearance of apparent scarring in the right apex. Mild atelectasis in the right middle and lower lobes is new in the interval. There is a new small right pleural effusion. No suspicious nodule or mass in the left lung. Minimal atelectasis noted left lower lobe. Tiny left pleural effusion evident. Musculoskeletal: Stable similar diffuse heterogeneous bone mineralization CT ABDOMEN PELVIS FINDINGS Hepatobiliary: No focal abnormality in the liver parenchyma. High attenuation material in the gallbladder lumen may be related to stones or sludge. No intrahepatic or extrahepatic biliary dilation. Pancreas: No focal mass lesion. No dilatation of the main duct. No intraparenchymal cyst. No peripancreatic edema. Spleen: No splenomegaly. No focal mass lesion. Adrenals/Urinary Tract: Similar to the thickening of the left adrenal gland. Thickening of the right adrenal gland is also similar to prior. Bilateral percutaneous nephrostomy tubes been removed in the interval.  There is mild to moderate right hydronephrosis with mild fullness of the left intrarenal collecting system. Neither ureter is distended beyond its midpoint. Urinary bladder is decompressed. Stomach/Bowel: Stomach is nondistended. No gastric wall thickening. No evidence of outlet obstruction. Duodenum is normally positioned as is the ligament of Treitz. No small bowel wall thickening. No small bowel dilatation. The terminal ileum is normal. The appendix is not visualized, but there is no edema or inflammation in the region of the cecum. Colon is diffusely decompressed. Vascular/Lymphatic: No abdominal aortic aneurysm. No retroperitoneal lymphadenopathy. No pelvic sidewall lymphadenopathy. Reproductive: But The uterus is unremarkable. There is no adnexal mass. Other: Moderate to large volume ascites is new in the interval. There is vascular congestion/nodularity in the gastrocolic ligament and omentum suggestion of peritoneal thickening seen in the left pelvis (96/2) and posterior pelvis bilaterally (102/2). Musculoskeletal: Similar appearance of heterogeneous bone mineralization with some more densely sclerotic lesions identified throughout as examples, see L5 vertebral body (84/2) left pubic bone (112/2) and posterior left  inferior pubic ramus (118/2). IMPRESSION: 1. Interval development of moderate to large volume ascites with congestion and nodularity in the gastrocolic ligament and omentum. Imaging features highly concerning for metastatic involvement. 2. Interval removal of percutaneous nephrostomy tubes with mild to moderate right hydronephrosis and mild fullness of the left intrarenal collecting system. 3. Diffuse heterogeneous bone mineralization with scattered sclerotic lesion similar to prior and compatible with metastatic disease. 4. Small right and tiny left pleural effusions. 5. Urinary bladder decompressed on today's study and not well evaluated. Electronically Signed   By: Misty Stanley M.D.   On:  04/04/2018 11:55   Ir Nephrostomy Placement Right  Result Date: 04/06/2018 INDICATION: Metastatic breast cancer, recurrent right hydronephrosis EXAM: ULTRASOUND AND FLUOROSCOPIC RIGHT NEPHROSTOMY INSERTION COMPARISON:  04/04/2018 MEDICATIONS: CIPRO 400 MG; The antibiotic was administered in an appropriate time frame prior to skin puncture. ANESTHESIA/SEDATION: Fentanyl 50 mcg IV; Versed 2 mg IV Moderate Sedation Time:  11 minutes The patient was continuously monitored during the procedure by the interventional radiology nurse under my direct supervision. CONTRAST:  10 cc-administered into the collecting system(s) FLUOROSCOPY TIME:  Fluoroscopy Time: 0 minutes 30 seconds ( mGy). COMPLICATIONS: None immediate. PROCEDURE: Informed written consent was obtained from the patient after a thorough discussion of the procedural risks, benefits and alternatives. All questions were addressed. Maximal Sterile Barrier Technique was utilized including caps, mask, sterile gowns, sterile gloves, sterile drape, hand hygiene and skin antiseptic. A timeout was performed prior to the initiation of the procedure. Previous imaging reviewed. Patient position prone. Right hydronephrotic kidney was localized and marked. Under sterile conditions anesthesia, an 18 gauge introducer needle was advanced into a dilated mid to lower pole calyx. Needle position confirmed with ultrasound. Images obtained for documentation. There was return of urine. Guidewire inserted followed by tract dilatation to insert a 10 French nephrostomy. Retention loop formed the renal pelvis. Contrast injection confirms position. Images obtained for documentation. Catheter secured with a prolene suture and a sterile dressing. Gravity drainage bag connected. No immediate complication. Patient tolerated the procedure well. IMPRESSION: Successful ultrasound and fluoroscopic 10 French right nephrostomy insertion Electronically Signed   By: Jerilynn Mages.  Shick M.D.   On: 04/06/2018  12:05     Assessment and plan Patient is a 58 y.o. female with metastatic breast cancer, visceral crisis present for follow-up for management of cancer related symptoms and recent ED visit. 1. Metastatic breast cancer ([place])   2. AKI (acute kidney injury) ([place])   3. Leptomeningeal disease   4. Port-A-Cath in place   5. Nephrostomy status ([place])   6. Encounter for antineoplastic chemotherapy   7. Acute deep vein thrombosis (DVT) of proximal vein of left lower extremity ([place])   8. Ommaya reservoir present   9. 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  Counts acceptable to proceed with today's Taxol treatment.  #Leptomeningeal involvement, status post Ommaya reservoir placement. Plan starting intrathecal methotrexate 12mg  twice weekly for 4 weeks.then weekly for 4 doses, then monthly for 4 doses. First dose on 04/20/2018.   #Malignant ascites/abdominal carcinomatosis. Fluid cytology positive for metastatic carcinoma.  Status post paracentesis x1, removed 4 L of fluid. Today her belly is mildly distended.  We will reevaluate her later this week, may need repeat paracentesis if symptomatic.  #AKI secondary to post renal obstruction due to ureteral involvement  Status post right nephrostomy tube placement.  Creatinine has normalized. #  Goal of care was discussed with patient.  Prognosis is poor.  Recommend patient to establish care with palliative care nurse practitioner Altha Harm.  #History of distal lower extremity DVT, finished 3 months of full anticoagulation, continue Eliquis 2.5mg  BID.  We spent sufficient time to discuss many aspect of care, questions were answered to patient's satisfaction. Total face to face encounter time for this patient visit was 25 min. >50% of the time was  spent in counseling and coordination of care. /   RTC 1  week.   Earlie Server, MD, PhD Hematology Oncology [facility] at [facility] Pager- [phone] 04/17/2018

## 2018-04-17 NOTE — Progress Notes (Signed)
Patient here for follow up. She had [place] place on Friday without complications, denies pain. Complains of pain to back of neck. Pt states feeling a little better. Patient requesting compazine refill.

## 2018-04-17 NOTE — Progress Notes (Signed)
Last faslodex and zoladex given on 12/30.  MD does not want to give injections today

## 2018-04-18 ENCOUNTER — Emergency Department: Payer: 59

## 2018-04-18 ENCOUNTER — Other Ambulatory Visit: Payer: Self-pay

## 2018-04-18 ENCOUNTER — Other Ambulatory Visit: Payer: Self-pay | Admitting: *Deleted

## 2018-04-18 ENCOUNTER — Inpatient Hospital Stay
Admission: EM | Admit: 2018-04-18 | Discharge: 2018-04-27 | DRG: 055 | Disposition: A | Discharge status: Hospice | Payer: 59 | Attending: Family Medicine | Admitting: Family Medicine

## 2018-04-18 DIAGNOSIS — C7949 Secondary malignant neoplasm of other parts of nervous system: Secondary | ICD-10-CM | POA: Diagnosis not present

## 2018-04-18 DIAGNOSIS — G8929 Other chronic pain: Secondary | ICD-10-CM | POA: Diagnosis not present

## 2018-04-18 DIAGNOSIS — R0902 Hypoxemia: Secondary | ICD-10-CM | POA: Diagnosis not present

## 2018-04-18 DIAGNOSIS — Z7401 Bed confinement status: Secondary | ICD-10-CM

## 2018-04-18 DIAGNOSIS — C50411 Malignant neoplasm of upper-outer quadrant of right female breast: Secondary | ICD-10-CM | POA: Diagnosis not present

## 2018-04-18 DIAGNOSIS — Z905 Acquired absence of kidney: Secondary | ICD-10-CM

## 2018-04-18 DIAGNOSIS — F419 Anxiety disorder, unspecified: Secondary | ICD-10-CM | POA: Diagnosis present

## 2018-04-18 DIAGNOSIS — G4700 Insomnia, unspecified: Secondary | ICD-10-CM | POA: Diagnosis present

## 2018-04-18 DIAGNOSIS — C50412 Malignant neoplasm of upper-outer quadrant of left female breast: Secondary | ICD-10-CM

## 2018-04-18 DIAGNOSIS — K589 Irritable bowel syndrome without diarrhea: Secondary | ICD-10-CM | POA: Diagnosis present

## 2018-04-18 DIAGNOSIS — C50919 Malignant neoplasm of unspecified site of unspecified female breast: Secondary | ICD-10-CM

## 2018-04-18 DIAGNOSIS — R4182 Altered mental status, unspecified: Secondary | ICD-10-CM | POA: Diagnosis present

## 2018-04-18 DIAGNOSIS — G893 Neoplasm related pain (acute) (chronic): Secondary | ICD-10-CM | POA: Diagnosis not present

## 2018-04-18 DIAGNOSIS — C800 Disseminated malignant neoplasm, unspecified: Secondary | ICD-10-CM | POA: Diagnosis not present

## 2018-04-18 DIAGNOSIS — M542 Cervicalgia: Secondary | ICD-10-CM | POA: Diagnosis present

## 2018-04-18 DIAGNOSIS — R180 Malignant ascites: Secondary | ICD-10-CM | POA: Diagnosis not present

## 2018-04-18 DIAGNOSIS — C50911 Malignant neoplasm of unspecified site of right female breast: Secondary | ICD-10-CM | POA: Diagnosis not present

## 2018-04-18 DIAGNOSIS — Z923 Personal history of irradiation: Secondary | ICD-10-CM

## 2018-04-18 DIAGNOSIS — Z833 Family history of diabetes mellitus: Secondary | ICD-10-CM

## 2018-04-18 DIAGNOSIS — F329 Major depressive disorder, single episode, unspecified: Secondary | ICD-10-CM | POA: Diagnosis present

## 2018-04-18 DIAGNOSIS — Z170 Estrogen receptor positive status [ER+]: Secondary | ICD-10-CM

## 2018-04-18 DIAGNOSIS — G919 Hydrocephalus, unspecified: Secondary | ICD-10-CM | POA: Diagnosis present

## 2018-04-18 DIAGNOSIS — Z79891 Long term (current) use of opiate analgesic: Secondary | ICD-10-CM

## 2018-04-18 DIAGNOSIS — J90 Pleural effusion, not elsewhere classified: Secondary | ICD-10-CM | POA: Diagnosis present

## 2018-04-18 DIAGNOSIS — G96198 Other disorders of meninges, not elsewhere classified: Secondary | ICD-10-CM

## 2018-04-18 DIAGNOSIS — Z800 Family history of malignant neoplasm of digestive organs: Secondary | ICD-10-CM

## 2018-04-18 DIAGNOSIS — E8770 Fluid overload, unspecified: Secondary | ICD-10-CM | POA: Diagnosis present

## 2018-04-18 DIAGNOSIS — R404 Transient alteration of awareness: Secondary | ICD-10-CM | POA: Diagnosis not present

## 2018-04-18 DIAGNOSIS — R188 Other ascites: Secondary | ICD-10-CM

## 2018-04-18 DIAGNOSIS — C7989 Secondary malignant neoplasm of other specified sites: Secondary | ICD-10-CM | POA: Diagnosis not present

## 2018-04-18 DIAGNOSIS — N1339 Other hydronephrosis: Secondary | ICD-10-CM | POA: Diagnosis not present

## 2018-04-18 DIAGNOSIS — K5903 Drug induced constipation: Secondary | ICD-10-CM | POA: Diagnosis not present

## 2018-04-18 DIAGNOSIS — C762 Malignant neoplasm of abdomen: Secondary | ICD-10-CM

## 2018-04-18 DIAGNOSIS — Z515 Encounter for palliative care: Secondary | ICD-10-CM | POA: Diagnosis present

## 2018-04-18 DIAGNOSIS — N1330 Unspecified hydronephrosis: Secondary | ICD-10-CM | POA: Diagnosis present

## 2018-04-18 DIAGNOSIS — Z87891 Personal history of nicotine dependence: Secondary | ICD-10-CM

## 2018-04-18 DIAGNOSIS — C799 Secondary malignant neoplasm of unspecified site: Secondary | ICD-10-CM | POA: Diagnosis not present

## 2018-04-18 DIAGNOSIS — R350 Frequency of micturition: Secondary | ICD-10-CM | POA: Diagnosis present

## 2018-04-18 DIAGNOSIS — K219 Gastro-esophageal reflux disease without esophagitis: Secondary | ICD-10-CM | POA: Diagnosis present

## 2018-04-18 DIAGNOSIS — C7931 Secondary malignant neoplasm of brain: Secondary | ICD-10-CM | POA: Diagnosis present

## 2018-04-18 DIAGNOSIS — K5900 Constipation, unspecified: Secondary | ICD-10-CM | POA: Diagnosis not present

## 2018-04-18 DIAGNOSIS — N182 Chronic kidney disease, stage 2 (mild): Secondary | ICD-10-CM | POA: Diagnosis present

## 2018-04-18 DIAGNOSIS — Z8719 Personal history of other diseases of the digestive system: Secondary | ICD-10-CM

## 2018-04-18 DIAGNOSIS — Z86718 Personal history of other venous thrombosis and embolism: Secondary | ICD-10-CM

## 2018-04-18 DIAGNOSIS — Z8041 Family history of malignant neoplasm of ovary: Secondary | ICD-10-CM

## 2018-04-18 DIAGNOSIS — Z853 Personal history of malignant neoplasm of breast: Secondary | ICD-10-CM

## 2018-04-18 DIAGNOSIS — Z8249 Family history of ischemic heart disease and other diseases of the circulatory system: Secondary | ICD-10-CM

## 2018-04-18 DIAGNOSIS — R4020 Unspecified coma: Secondary | ICD-10-CM | POA: Diagnosis not present

## 2018-04-18 DIAGNOSIS — Z820 Family history of epilepsy and other diseases of the nervous system: Secondary | ICD-10-CM

## 2018-04-18 DIAGNOSIS — C7981 Secondary malignant neoplasm of breast: Secondary | ICD-10-CM | POA: Diagnosis not present

## 2018-04-18 DIAGNOSIS — Z936 Other artificial openings of urinary tract status: Secondary | ICD-10-CM | POA: Diagnosis not present

## 2018-04-18 DIAGNOSIS — Z9221 Personal history of antineoplastic chemotherapy: Secondary | ICD-10-CM

## 2018-04-18 DIAGNOSIS — G039 Meningitis, unspecified: Secondary | ICD-10-CM | POA: Diagnosis not present

## 2018-04-18 DIAGNOSIS — R55 Syncope and collapse: Secondary | ICD-10-CM

## 2018-04-18 DIAGNOSIS — C7951 Secondary malignant neoplasm of bone: Secondary | ICD-10-CM | POA: Diagnosis not present

## 2018-04-18 DIAGNOSIS — C7911 Secondary malignant neoplasm of bladder: Secondary | ICD-10-CM | POA: Diagnosis not present

## 2018-04-18 DIAGNOSIS — Z66 Do not resuscitate: Secondary | ICD-10-CM | POA: Diagnosis not present

## 2018-04-18 DIAGNOSIS — Z9013 Acquired absence of bilateral breasts and nipples: Secondary | ICD-10-CM

## 2018-04-18 DIAGNOSIS — M2550 Pain in unspecified joint: Secondary | ICD-10-CM | POA: Diagnosis not present

## 2018-04-18 DIAGNOSIS — R569 Unspecified convulsions: Secondary | ICD-10-CM | POA: Diagnosis present

## 2018-04-18 DIAGNOSIS — Z7901 Long term (current) use of anticoagulants: Secondary | ICD-10-CM

## 2018-04-18 DIAGNOSIS — Z803 Family history of malignant neoplasm of breast: Secondary | ICD-10-CM

## 2018-04-18 DIAGNOSIS — Z79899 Other long term (current) drug therapy: Secondary | ICD-10-CM

## 2018-04-18 LAB — BASIC METABOLIC PANEL
Anion gap: 9 (ref 5–15)
BUN: 11 mg/dL (ref 6–20)
CO2: 26 mmol/L (ref 22–32)
CREATININE: 0.82 mg/dL (ref 0.44–1.00)
Calcium: 8.3 mg/dL — ABNORMAL LOW (ref 8.9–10.3)
Chloride: 99 mmol/L (ref 98–111)
GFR calc Af Amer: >60 mL/min (ref >60)
GFR calc non Af Amer: >60 mL/min (ref >60)
Glucose, Bld: 138 mg/dL — ABNORMAL HIGH (ref 70–99)
Potassium: 3.6 mmol/L (ref 3.5–5.1)
Sodium: 134 mmol/L — ABNORMAL LOW (ref 135–145)

## 2018-04-18 LAB — CBC
HCT: 34.3 % — ABNORMAL LOW (ref 36.0–46.0)
Hemoglobin: 11.1 g/dL — ABNORMAL LOW (ref 12.0–15.0)
MCH: 29.8 pg (ref 26.0–34.0)
MCHC: 32.4 g/dL (ref 30.0–36.0)
MCV: 92 fL (ref 80.0–100.0)
Platelets: 385 10*3/uL (ref 150–400)
RBC: 3.73 MIL/uL — ABNORMAL LOW (ref 3.87–5.11)
RDW: 17.6 % — AB (ref 11.5–15.5)
WBC: 5.4 10*3/uL (ref 4.0–10.5)
nRBC: 0 % (ref 0.0–0.2)

## 2018-04-18 LAB — TROPONIN I: Troponin I: <0.03 ng/mL (ref <0.03)

## 2018-04-18 LAB — HEPATIC FUNCTION PANEL
ALT: 40 U/L (ref 0–44)
AST: 72 U/L — ABNORMAL HIGH (ref 15–41)
Albumin: 2.9 g/dL — ABNORMAL LOW (ref 3.5–5.0)
Alkaline Phosphatase: 175 U/L — ABNORMAL HIGH (ref 38–126)
Bilirubin, Direct: <0.1 mg/dL (ref 0.0–0.2)
Total Bilirubin: 0.3 mg/dL (ref 0.3–1.2)
Total Protein: 6.4 g/dL — ABNORMAL LOW (ref 6.5–8.1)

## 2018-04-18 LAB — AMMONIA: Ammonia: 20 umol/L (ref 9–35)

## 2018-04-18 MED ORDER — PANTOPRAZOLE SODIUM 40 MG PO TBEC
40.0000 mg | DELAYED_RELEASE_TABLET | Freq: Every day | ORAL | Status: DC
  Administered 2018-04-19 – 2018-04-21 (×3): 40 mg via ORAL
  Filled 2018-04-18 (×3): qty 1

## 2018-04-18 MED ORDER — DIAZEPAM 5 MG PO TABS
10.0000 mg | ORAL_TABLET | Freq: Every evening | ORAL | Status: DC | PRN
  Administered 2018-04-22 (×2): 10 mg via ORAL
  Filled 2018-04-18 (×2): qty 2

## 2018-04-18 MED ORDER — ONDANSETRON HCL 4 MG PO TABS
4.0000 mg | ORAL_TABLET | Freq: Four times a day (QID) | ORAL | Status: DC | PRN
  Administered 2018-04-18 – 2018-04-19 (×2): 4 mg via ORAL
  Filled 2018-04-18: qty 1

## 2018-04-18 MED ORDER — LOPERAMIDE HCL 2 MG PO CAPS: 2.0000 mg | ORAL_CAPSULE | Freq: Four times a day (QID) | ORAL | Status: DC | PRN

## 2018-04-18 MED ORDER — ONDANSETRON HCL 4 MG PO TABS: 4.0000 mg | ORAL_TABLET | Freq: Three times a day (TID) | ORAL | Status: DC | PRN

## 2018-04-18 MED ORDER — PROCHLORPERAZINE MALEATE 10 MG PO TABS
10.0000 mg | ORAL_TABLET | Freq: Four times a day (QID) | ORAL | Status: DC | PRN
  Filled 2018-04-18: qty 1

## 2018-04-18 MED ORDER — OXYCODONE HCL ER 10 MG PO T12A: 10.0000 mg | EXTENDED_RELEASE_TABLET | Freq: Two times a day (BID) | ORAL | 0 refills | Status: DC

## 2018-04-18 MED ORDER — OXYCODONE HCL 5 MG PO TABS
5.0000 mg | ORAL_TABLET | Freq: Four times a day (QID) | ORAL | Status: DC | PRN
  Administered 2018-04-19: 5 mg via ORAL
  Filled 2018-04-18: qty 1

## 2018-04-18 MED ORDER — HYDROMORPHONE HCL 1 MG/ML IJ SOLN
1.0000 mg | Freq: Three times a day (TID) | INTRAMUSCULAR | Status: DC | PRN
  Administered 2018-04-18 – 2018-04-20 (×4): 1 mg via INTRAVENOUS
  Filled 2018-04-18 (×3): qty 1

## 2018-04-18 MED ORDER — ACETAMINOPHEN 650 MG RE SUPP: 650.0000 mg | Freq: Four times a day (QID) | RECTAL | Status: DC | PRN

## 2018-04-18 MED ORDER — AMLODIPINE BESYLATE 5 MG PO TABS
5.0000 mg | ORAL_TABLET | Freq: Every day | ORAL | Status: DC
  Administered 2018-04-19 – 2018-04-21 (×3): 5 mg via ORAL
  Filled 2018-04-18 (×3): qty 1

## 2018-04-18 MED ORDER — ONDANSETRON HCL 4 MG/2ML IJ SOLN
4.0000 mg | Freq: Four times a day (QID) | INTRAMUSCULAR | Status: DC | PRN
  Filled 2018-04-18: qty 2

## 2018-04-18 MED ORDER — SCOPOLAMINE 1 MG/3DAYS TD PT72
1.0000 | MEDICATED_PATCH | TRANSDERMAL | Status: DC
  Administered 2018-04-19 – 2018-04-25 (×3): 1.5 mg via TRANSDERMAL
  Filled 2018-04-18 (×4): qty 1

## 2018-04-18 MED ORDER — VITAMIN C 500 MG PO TABS
250.0000 mg | ORAL_TABLET | Freq: Every day | ORAL | Status: DC
  Administered 2018-04-19 – 2018-04-20 (×2): 250 mg via ORAL
  Filled 2018-04-18 (×2): qty 1

## 2018-04-18 MED ORDER — FERROUS SULFATE 325 (65 FE) MG PO TABS
325.0000 mg | ORAL_TABLET | Freq: Every day | ORAL | Status: DC
  Administered 2018-04-19 – 2018-04-20 (×2): 325 mg via ORAL
  Filled 2018-04-18 (×2): qty 1

## 2018-04-18 MED ORDER — HYDROMORPHONE HCL 1 MG/ML IJ SOLN
1.0000 mg | Freq: Once | INTRAMUSCULAR | Status: AC
  Administered 2018-04-18: 1 mg via INTRAVENOUS
  Filled 2018-04-18: qty 1

## 2018-04-18 MED ORDER — FUROSEMIDE 20 MG PO TABS: 20.0000 mg | ORAL_TABLET | Freq: Every day | ORAL | Status: DC | PRN

## 2018-04-18 MED ORDER — SENNA 8.6 MG PO TABS
2.0000 | ORAL_TABLET | Freq: Every day | ORAL | Status: DC
  Administered 2018-04-19: 17.2 mg via ORAL
  Filled 2018-04-18: qty 2

## 2018-04-18 MED ORDER — HYDROMORPHONE HCL 1 MG/ML IJ SOLN
INTRAMUSCULAR | Status: AC
  Administered 2018-04-18: 1 mg via INTRAVENOUS
  Filled 2018-04-18: qty 1

## 2018-04-18 MED ORDER — OXYCODONE HCL ER 10 MG PO T12A
10.0000 mg | EXTENDED_RELEASE_TABLET | Freq: Two times a day (BID) | ORAL | Status: DC
  Administered 2018-04-18 – 2018-04-21 (×6): 10 mg via ORAL
  Filled 2018-04-18 (×6): qty 1

## 2018-04-18 MED ORDER — APIXABAN 2.5 MG PO TABS
2.5000 mg | ORAL_TABLET | Freq: Two times a day (BID) | ORAL | Status: DC
  Administered 2018-04-19 – 2018-04-24 (×9): 2.5 mg via ORAL
  Filled 2018-04-18 (×10): qty 1

## 2018-04-18 MED ORDER — ACETAMINOPHEN 325 MG PO TABS: 650.0000 mg | ORAL_TABLET | Freq: Four times a day (QID) | ORAL | Status: DC | PRN

## 2018-04-18 MED ORDER — OXYCODONE HCL 5 MG PO TABS: 5.0000 mg | ORAL_TABLET | Freq: Four times a day (QID) | ORAL | 0 refills | Status: DC | PRN

## 2018-04-18 MED ORDER — SODIUM CHLORIDE 0.9 % IV BOLUS
500.0000 mL | Freq: Once | INTRAVENOUS | Status: AC
  Administered 2018-04-18: 500 mL via INTRAVENOUS

## 2018-04-18 MED ORDER — ADULT MULTIVITAMIN W/MINERALS CH
1.0000 | ORAL_TABLET | Freq: Every day | ORAL | Status: DC
  Administered 2018-04-19 – 2018-04-21 (×3): 1 via ORAL
  Filled 2018-04-18 (×4): qty 1

## 2018-04-18 MED ORDER — ALPRAZOLAM 0.25 MG PO TABS
0.2500 mg | ORAL_TABLET | Freq: Two times a day (BID) | ORAL | Status: DC | PRN
  Administered 2018-04-19 – 2018-04-23 (×5): 0.25 mg via ORAL
  Filled 2018-04-18 (×5): qty 1

## 2018-04-18 MED ORDER — POLYETHYLENE GLYCOL 3350 17 G PO PACK
17.0000 g | PACK | Freq: Every day | ORAL | Status: DC | PRN
  Administered 2018-04-19: 23:00:00 17 g via ORAL
  Filled 2018-04-18: qty 1

## 2018-04-18 MED ORDER — DRONABINOL 2.5 MG PO CAPS
5.0000 mg | ORAL_CAPSULE | Freq: Two times a day (BID) | ORAL | Status: DC
  Administered 2018-04-19 – 2018-04-23 (×8): 5 mg via ORAL
  Filled 2018-04-18 (×9): qty 2

## 2018-04-18 NOTE — ED Provider Notes (Signed)
[facility] Emergency Department Provider Note ____________________________________________   First MD Initiated Contact with Patient 04/18/18 1511     (approximate)  I have reviewed the triage vital signs and the nursing notes.   HISTORY  Chief Complaint Loss of Consciousness    HPI Mary [name] is a 58 y.o. female with PMH as noted below including metastatic breast cancer who presents with recurrent syncopal episodes.  The patient states that over the few months she has had intermittent syncopal episodes lasting a few minutes.  Over the last 1 to 2 weeks they have increased in frequency and now happen as often as several times per day.  Her husband describes that the patient will sometimes appear near syncopal and lightheaded but not fully unconscious, but other times she loses consciousness and is then unresponsive for around 5 minutes.  He denies seeing any convulsions.  The patient reports some neck pain and some abdominal discomfort both of which have been present for a few weeks and are not new today.  The family also reports that she has had constipation and visiting nurses have reported she is impacted with stool.  The patient denies any other acute symptoms at this time.  Past Medical History:  Diagnosis Date  . Anxiety    takes Xanax prn  . Breast cancer [facility]) August 2013   bilateral, er/pr+, bladder (02/2018..mets from breast ca)  . Chronic kidney disease 01/2018   recent shut down of kidneys with labs out of whack  . Complication of anesthesia    pt states she had the shakes extremely bad  . Cough    at night d/t reflux;nothing productive  . Dental crowns present    x 4  . Depression   . GERD (gastroesophageal reflux disease)    takes Nexium daily  . History of bronchitis    08/2011  . History of colon polyps   . Hot flashes, menopausal 08/08/2012  . IBS (irritable bowel syndrome)    immodium prn  . Insomnia    takes Ambien  nightly  . Irritable bowel syndrome   . 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 Neutropenia, drug-induced ([place]) 12/15/2011  . Nocturia   . Plantar fasciitis   . S/P radiation therapy 07/03/2012 through 08/18/2038                                                        Right chest wall/regional lymph nodes 5040 cGy 28 sessions, right chest wall/mastectomy scar boost 1000 cGy 5 sessions                          . Status post chemotherapy    FEC 100 starting on 12/08/2011 - 01/18/12/single agent Taxol x12 weeks from 02/02/2012 through January 2014   . Urinary frequency   . Use of tamoxifen (Nolvadex)    now stopped. will be starting ibrance on 03/05/18  . Vertigo     Patient Active Problem List   Diagnosis Date Noted  . Leptomeningeal metastases ([place]) 04/17/2018  . Weakness generalized 04/06/2018  . 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 Goals of care, counseling/discussion 01/14/2018  . Edema of left lower extremity   . Metastatic breast cancer ([place])   . Hypercalcemia 12/16/2017  . Malignancy ([place])   .  Acute deep vein thrombosis (DVT) of distal vein of left lower extremity ([place])   . Dizziness   . Bilateral posterior neck pain 11/24/2017  . Elevated liver enzymes 11/24/2017  . Tubular adenoma of colon 10/11/2017  . 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 Generalized anxiety disorder 09/19/2017  . Vertigo, labyrinthine, bilateral 08/27/2017  . Prediabetes 08/27/2017  . Encounter for screening colonoscopy 09/21/2014  . Other malaise and fatigue 12/14/2013  . Hot flashes, menopausal 08/08/2012  . Neutropenia, drug-induced ([place]) 12/15/2011  . Primary cancer of upper outer quadrant of left female breast ([place]) 11/17/2011  . Hypertension 09/23/2011  . PLANTAR FASCIITIS, LEFT 12/01/2009  . UNEQUAL LEG LENGTH 12/01/2009    Past Surgical History:  Procedure Laterality Date  . BREAST LUMPECTOMY  2008   left  . COLONOSCOPY    . COLONOSCOPY WITH PROPOFOL N/A 10/10/2017    Procedure: COLONOSCOPY WITH PROPOFOL;  Surgeon: Lucilla Lame, MD;  Location: [facility] ENDOSCOPY;  Service: Endoscopy;  Laterality: N/A;  . CYSTOSCOPY WITH STENT PLACEMENT Bilateral 12/17/2017   Procedure: CYSTOSCOPY WITH STENT PLACEMENT;  Surgeon: Hollice Espy, MD;  Location: [facility] ORS;  Service: Urology;  Laterality: Bilateral;  . IR NEPHROSTOMY EXCHANGE LEFT  02/13/2018  . IR NEPHROSTOMY EXCHANGE RIGHT  01/02/2018  . IR NEPHROSTOMY EXCHANGE RIGHT  02/13/2018  . IR NEPHROSTOMY PLACEMENT LEFT  12/19/2017  . IR NEPHROSTOMY PLACEMENT RIGHT  12/19/2017  . IR NEPHROSTOMY PLACEMENT RIGHT  04/06/2018  . LUMBAR PUNCTURE  03/01/2018  . MASTECTOMY Bilateral 2013  . MASTECTOMY WITH AXILLARY LYMPH NODE DISSECTION Right 05/18/2012   Procedure: MASTECTOMY WITH AXILLARY LYMPH NODE DISSECTION;  Surgeon: Adin Hector, MD;  Location: [place];  Service: General;  Laterality: Right;  Bilateral Total Mastectomy , right axillary lymph node dissection left axillary sentinel node biopsy, removal of Porta Cath  . NEPHROSTOMY TUBE REMOVAL Bilateral 03/01/2018   removed tubes in dr. Erlene Quan office  . PORT-A-CATH REMOVAL N/A 05/18/2012   Procedure: REMOVAL PORT-A-CATH;  Surgeon: Adin Hector, MD;  Location: [place];  Service: General;  Laterality: N/A;  . PORTA CATH INSERTION N/A 12/26/2017   Procedure: PORTA CATH INSERTION;  Surgeon: Katha Cabal, MD;  Location: [place] CV LAB;  Service: Cardiovascular;  Laterality: N/A;  . PORTACATH PLACEMENT  12/02/2011   Procedure: INSERTION PORT-A-CATH;  Surgeon: Adin Hector, MD;  Location: [place];  Service: General;  Laterality: N/A;  insertion port a cath with fluoroscopy  . SIMPLE MASTECTOMY WITH AXILLARY SENTINEL NODE BIOPSY Left 05/18/2012   Procedure: SIMPLE MASTECTOMY WITH AXILLARY SENTINEL NODE BIOPSY;  Surgeon: Adin Hector, MD;  Location: [place];  Service: General;  Laterality: Left;    Prior to Admission medications   Medication Sig  Start Date End Date Taking? Authorizing Provider  ALPRAZolam (XANAX) 0.25 MG tablet TAKE ONE TABLET BY MOUTH TWICE DAILY AS NEEDED FOR ANXIETY 07/24/12  Yes [provider]  amLODipine (NORVASC) 5 MG tablet Take 1 tablet (5 mg total) by mouth daily. 04/02/18  Yes Earlie Server, MD  apixaban (ELIQUIS) 2.5 MG TABS tablet Take 1 tablet (2.5 mg total) by mouth 2 (two) times daily. 04/02/18  Yes Earlie Server, MD  Ascorbic Acid (VITAMIN C) 100 MG tablet Take 100 mg by mouth daily.     Yes [provider]  Calcium Carbonate-Vitamin D (CALTRATE 600+D PO) Take 1 tablet by mouth daily.   Yes [provider]  diazepam (VALIUM) 10 MG tablet Take 1 tablet (10  mg total) by mouth at bedtime as needed for anxiety. 09/18/17  Yes [name], MD  dronabinol (MARINOL) 5 MG capsule Take 1 capsule (5 mg total) by mouth 2 (two) times daily before lunch and supper. 04/04/18  Yes Earlie Server, MD  esomeprazole (NEXIUM) 40 MG capsule Take 40 mg by mouth daily as needed.    Yes [provider]  ferrous sulfate 325 (65 FE) MG EC tablet Take 1 tablet (325 mg total) by mouth daily. 01/05/18  Yes Earlie Server, MD  fulvestrant (FASLODEX) 250 MG/5ML injection Inject 250 mg into the muscle every 14 (fourteen) days. One injection each buttock over 1-2 minutes. Warm prior to use.   Yes [provider]  furosemide (LASIX) 20 MG tablet Take 1 tablet (20 mg total) by mouth daily as needed. 01/05/18  Yes Earlie Server, MD  goserelin (ZOLADEX) 3.6 MG injection Inject 3.6 mg into the skin every 28 (twenty-eight) days.   Yes [provider]  lidocaine-prilocaine (EMLA) cream Apply to affected area once 12/21/17  Yes Earlie Server, MD  loperamide (IMODIUM A-D) 2 MG tablet Take 2 mg by mouth 4 (four) times daily as needed for diarrhea or loose stools.    Yes [provider]  Multiple Vitamin (MULTIVITAMIN) tablet Take 1 tablet by mouth daily.     Yes [provider]  ondansetron (ZOFRAN) 4 MG tablet  TAKE 1 TABLET BY MOUTH EVERY 8 HOURS AS NEEDED FOR NAUSEA OR VOMITING 11/22/17  Yes [provider]  oxyCODONE (OXY IR/ROXICODONE) 5 MG immediate release tablet Take 1 tablet (5 mg total) by mouth every 6 (six) hours as needed for moderate pain or severe pain. 04/18/18  Yes Earlie Server, MD  oxyCODONE (OXYCONTIN) 10 mg 12 hr tablet Take 1 tablet (10 mg total) by mouth every 12 (twelve) hours. 04/18/18  Yes Earlie Server, MD  prochlorperazine (COMPAZINE) 10 MG tablet Take 1 tablet (10 mg total) by mouth every 6 (six) hours as needed (Nausea or vomiting). 04/17/18  Yes Earlie Server, MD  scopolamine (TRANSDERM-SCOP) 1 MG/3DAYS Place 1 patch (1.5 mg total) onto the skin every 3 (three) days. 03/19/18  Yes Earlie Server, MD  senna (SENOKOT) 8.6 MG TABS tablet Take 2 tablets (17.2 mg total) by mouth daily. 04/17/18  Yes Earlie Server, MD    Allergies Adhesive [tape]  Family History  Problem Relation Age of Onset  . Hypertension Mother   . Diabetes Mother   . Hypertension Maternal Grandmother   . Pancreatic cancer Paternal Grandmother        diagnosed in her 59s  . Ovarian cancer Maternal Aunt        maternal grandmother's sister  . Breast cancer Paternal Aunt        diagnosed in late 51s early 51s  . Parkinsonism Paternal Grandfather     Social History Social History   Tobacco Use  . Smoking status: Former Smoker    Packs/day: 0.50    Years: 15.00    Pack years: 7.50    Types: Cigarettes    Last attempt to quit: 03/21/1997    Years since quitting: 21.0  . Smokeless tobacco: Never Used  Substance Use Topics  . Alcohol use: Not Currently    Alcohol/week: 0.0 standard drinks    Comment: occasionally wine  . Drug use: No    Review of Systems  Constitutional: No fever. Eyes: No visual changes. ENT: Positive for neck pain. Cardiovascular: Denies chest pain. Respiratory: Denies shortness of breath. Gastrointestinal:  No vomiting or diarrhea.  Genitourinary: Negative for dysuria.  Musculoskeletal:  Negative for back pain. Skin: Negative for rash. Neurological: Negative for headache.   ____________________________________________   PHYSICAL EXAM:  VITAL SIGNS: ED Triage Vitals  Enc Vitals Group     BP 04/18/18 1459 (!) 149/88     Pulse Rate 04/18/18 1459 87     Resp 04/18/18 1459 12     Temp 04/18/18 1459 98.4 F (36.9 C)     Temp Source 04/18/18 1459 Oral     SpO2 04/18/18 1459 98 %     Weight 04/18/18 1500 146 lb (66.2 kg)     Height 04/18/18 1500 5\' 5"  (1.651 m)     Head Circumference --      Peak Flow --      Pain Score 04/18/18 1500 5     Pain Loc --      Pain Edu? --      Excl. in [place]? --     Constitutional: Alert and oriented.  Chronically ill-appearing but in no acute distress. Eyes: Conjunctivae are normal.  EOMI.  PERRLA. Head: Atraumatic. Nose: No congestion/rhinnorhea. Mouth/Throat: Mucous membranes are dry.   Neck: Normal range of motion.  Cardiovascular: Normal rate, regular rhythm. Grossly normal heart sounds.  Good peripheral circulation. Respiratory: Normal respiratory effort.  No retractions. Lungs CTAB. Gastrointestinal: Soft and nontender.  Mild distention.  Genitourinary: No flank tenderness. Musculoskeletal:  Extremities warm and well perfused.  Neurologic:  Normal speech and language.  Motor intact in all extremities.  Normal coordination.  No gross focal neurologic deficits are appreciated.  Skin:  Skin is warm and dry. No rash noted. Psychiatric: Mood and affect are normal. Speech and behavior are normal.  ____________________________________________   LABS (all labs ordered are listed, but only abnormal results are displayed)  Labs Reviewed  BASIC METABOLIC PANEL - Abnormal; Notable for the following components:      Result Value   Sodium 134 (*)    Glucose, Bld 138 (*)    Calcium 8.3 (*)    All other components within normal limits  CBC - Abnormal; Notable for the following components:   RBC 3.73 (*)    Hemoglobin 11.1 (*)     HCT 34.3 (*)    RDW 17.6 (*)    All other components within normal limits  HEPATIC FUNCTION PANEL - Abnormal; Notable for the following components:   Total Protein 6.4 (*)    Albumin 2.9 (*)    AST 72 (*)    Alkaline Phosphatase 175 (*)    All other components within normal limits  TROPONIN I  AMMONIA  URINALYSIS, COMPLETE (UACMP) WITH MICROSCOPIC  CBG MONITORING, ED  POC URINE PREG, ED   ____________________________________________  EKG  ED ECG REPORT I, Arta Silence, the attending physician, personally viewed and interpreted this ECG.  Date: 04/18/2018 EKG Time: 1500 Rate: 87 Rhythm: normal sinus rhythm QRS Axis: Borderline left axis Intervals: normal ST/T Wave abnormalities: normal Narrative Interpretation: no evidence of acute ischemia  ____________________________________________  RADIOLOGY  XR abdomen: Nonobstructive bowel gas pattern  ____________________________________________   PROCEDURES  Procedure(s) performed: No  Procedures  Critical Care performed: No ____________________________________________   INITIAL IMPRESSION / ASSESSMENT AND PLAN / ED COURSE  Pertinent labs & imaging results that were available during my care of the patient were reviewed by me and considered in my medical decision making (see chart for details).  58 year old female with PMH as noted above including metastatic breast cancer for  which she is currently under treatment at the cancer center presents with recurrent syncopal episodes that have been increasing in frequency over the last few weeks.  They are not associated with any convulsions or seizure-like activity.  She does have a prodrome of lightheadedness.  On exam the patient is chronically ill-appearing but in no acute distress.  Her vital signs are normal.  Neuro exam is nonfocal.  She has dry mucous membranes.  The remainder of the exam is as described above.  Overall I suspect symptoms related to hypovolemia  and side effects from her chemotherapy.  I reviewed the past medical records in [place].  The patient had an MRI last month which showed enhancement at the auditory canals which could be possibly due to metastases, and brain metastases certainly could be contributing to the patient's symptoms although she has no focal neuro findings at this time.  There is no evidence of cardiac etiology.  We will obtain lab work-up, give IV fluids for hydration, and reassess.  ----------------------------------------- 6:22 PM on 04/18/2018 -----------------------------------------  Lab workup is reassuring. The patient reported some increased pain so I gave a dose of dilaudid.  I performed DRE because the patient and family were concerned for fecal impaction but there was no significant stool palpated.  Because of the constipation and abdominal distention I obtained an abdominal X-ray which was negative.    Based on further discussion with the patient and family given her recurrent worsening syncope, they would feel more comfortable with the patient admitted for observation and monitoring.  I signed the patient out to the hospitalist Dr. Marthann Schiller at approximately 6:15 PM.  ____________________________________________   FINAL CLINICAL IMPRESSION(S) / ED DIAGNOSES  Final diagnoses:  Constipation  Syncope, unspecified syncope type      NEW MEDICATIONS STARTED DURING THIS VISIT:  New Prescriptions   No medications on file     Note:  This document was prepared using Dragon voice recognition software and may include unintentional dictation errors.    Arta Silence, MD 04/18/18 [phone]

## 2018-04-18 NOTE — ED Notes (Signed)
Caregiver at bedside has drained nephrostomy tube. RN flushed nephrostomy at family request.

## 2018-04-18 NOTE — ED Notes (Addendum)
Admitting MD at bedside. Family reporting increased pain.

## 2018-04-18 NOTE — Addendum Note (Signed)
Addended by: Earlie Server on: 04/18/2018 10:34 AM   Modules accepted: Orders

## 2018-04-18 NOTE — ED Notes (Signed)
Pts family reporting nausea after rolling around for disimpaction and Abd Xray. Pt has not vomited yet. MD made aware.

## 2018-04-18 NOTE — ED Notes (Signed)
Pt reporting increased pain. MD made aware.

## 2018-04-18 NOTE — ED Notes (Signed)
Myself and the student nurse changed the patients shirt and placed a gown on her. She started vomiting and got some on her cloths. Caretaker also by her side.

## 2018-04-18 NOTE — ED Notes (Addendum)
MD at bedside with patient and patient's family for assessment.

## 2018-04-18 NOTE — Progress Notes (Signed)
Family Meeting Note  Advance Directive:yes  Today a meeting took place with the husband patient comes in with intermittent blanking out spells has history of left meningeal meds from breast cancer. Currently on chemotherapy. Being admitted for rule out seizures. Discuss code status with patient's husband patient is a full code.  Time spent during discussion:16 mins Fritzi Mandes, MD

## 2018-04-18 NOTE — ED Notes (Signed)
Patient transported to CT 

## 2018-04-18 NOTE — H&P (Signed)
[facility] [place] NAME: Mary [name]    MR#:  [id number]  DATE OF BIRTH:  04-Apr-1960  DATE OF ADMISSION:  04/18/2018  PRIMARY CARE PHYSICIAN: [name], MD   REQUESTING/REFERRING PHYSICIAN: Dr Cherylann Banas  CHIEF COMPLAINT:  blanking out spells with intermittent syncopal episodes  HISTORY OF PRESENT ILLNESS:  Mary [name]  is a 58 y.o. female with a known history of metastatic breast cancer with little meningeal meds recently had placement of [facility] [place] neurosurgery April 13, 2017. Patient will get her next chemo on Friday through that reservoir . She follows with Dr. Tasia Catchings the cancer center. Patient has been having episodes of blanking out spells with intermittent loss of consciousness. She is not had any fall. She does not walk. She has caregivers at home according to the husband in the room. CT head was done which is essentially stable from previous CT scan. ER physician spoke with neurosurgery at [facility] and they recommend patient be admitted for rule out possible seizure if CT head is negative.  No Witnessed seizures. Patient has not had EEG. She is currently awake alert and conversation with me.  Patient is being admitted for further evaluation of management.   PAST MEDICAL HISTORY:   Past Medical History:  Diagnosis Date  . Anxiety    takes Xanax prn  . Breast cancer [facility]) August 2013   bilateral, er/pr+, bladder (02/2018..mets from breast ca)  . Chronic kidney disease 01/2018   recent shut down of kidneys with labs out of whack  . Complication of anesthesia    pt states she had the shakes extremely bad  . Cough    at night d/t reflux;nothing productive  . Dental crowns present    x 4  . Depression   . GERD (gastroesophageal reflux disease)    takes Nexium daily  . History of bronchitis    08/2011  . History of colon polyps   . Hot flashes, menopausal 08/08/2012  . IBS (irritable bowel syndrome)     immodium prn  . Insomnia    takes Ambien nightly  . Irritable bowel syndrome   . 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 Neutropenia, drug-induced ([place]) 12/15/2011  . Nocturia   . Plantar fasciitis   . S/P radiation therapy 07/03/2012 through 08/18/2038                                                        Right chest wall/regional lymph nodes 5040 cGy 28 sessions, right chest wall/mastectomy scar boost 1000 cGy 5 sessions                          . Status post chemotherapy    FEC 100 starting on 12/08/2011 - 01/18/12/single agent Taxol x12 weeks from 02/02/2012 through January 2014   . Urinary frequency   . Use of tamoxifen (Nolvadex)    now stopped. will be starting ibrance on 03/05/18  . Vertigo     PAST SURGICAL HISTOIRY:   Past Surgical History:  Procedure Laterality Date  . BREAST LUMPECTOMY  2008   left  . COLONOSCOPY    . COLONOSCOPY WITH PROPOFOL N/A 10/10/2017   Procedure: COLONOSCOPY WITH PROPOFOL;  Surgeon: Lucilla Lame, MD;  Location: [facility] ENDOSCOPY;  Service: Endoscopy;  Laterality: N/A;  . CYSTOSCOPY WITH STENT PLACEMENT Bilateral 12/17/2017   Procedure: CYSTOSCOPY WITH STENT PLACEMENT;  Surgeon: Hollice Espy, MD;  Location: [facility] ORS;  Service: Urology;  Laterality: Bilateral;  . IR NEPHROSTOMY EXCHANGE LEFT  02/13/2018  . IR NEPHROSTOMY EXCHANGE RIGHT  01/02/2018  . IR NEPHROSTOMY EXCHANGE RIGHT  02/13/2018  . IR NEPHROSTOMY PLACEMENT LEFT  12/19/2017  . IR NEPHROSTOMY PLACEMENT RIGHT  12/19/2017  . IR NEPHROSTOMY PLACEMENT RIGHT  04/06/2018  . LUMBAR PUNCTURE  03/01/2018  . MASTECTOMY Bilateral 2013  . MASTECTOMY WITH AXILLARY LYMPH NODE DISSECTION Right 05/18/2012   Procedure: MASTECTOMY WITH AXILLARY LYMPH NODE DISSECTION;  Surgeon: Adin Hector, MD;  Location: [place];  Service: General;  Laterality: Right;  Bilateral Total Mastectomy , right axillary lymph node dissection left axillary sentinel node biopsy, removal of Porta Cath  . NEPHROSTOMY TUBE  REMOVAL Bilateral 03/01/2018   removed tubes in dr. Erlene Quan office  . PORT-A-CATH REMOVAL N/A 05/18/2012   Procedure: REMOVAL PORT-A-CATH;  Surgeon: Adin Hector, MD;  Location: [place];  Service: General;  Laterality: N/A;  . PORTA CATH INSERTION N/A 12/26/2017   Procedure: PORTA CATH INSERTION;  Surgeon: Katha Cabal, MD;  Location: [place] [facility];  Service: Cardiovascular;  Laterality: N/A;  . PORTACATH PLACEMENT  12/02/2011   Procedure: INSERTION PORT-A-CATH;  Surgeon: Adin Hector, MD;  Location: [place];  Service: General;  Laterality: N/A;  insertion port a cath with fluoroscopy  . SIMPLE MASTECTOMY WITH AXILLARY SENTINEL NODE BIOPSY Left 05/18/2012   Procedure: SIMPLE MASTECTOMY WITH AXILLARY SENTINEL NODE BIOPSY;  Surgeon: Adin Hector, MD;  Location: [place];  Service: General;  Laterality: Left;    SOCIAL HISTORY:   Social History   Tobacco Use  . Smoking status: Former Smoker    Packs/day: 0.50    Years: 15.00    Pack years: 7.50    Types: Cigarettes    Last attempt to quit: 03/21/1997    Years since quitting: 21.0  . Smokeless tobacco: Never Used  Substance Use Topics  . Alcohol use: Not Currently    Alcohol/week: 0.0 standard drinks    Comment: occasionally wine    FAMILY HISTORY:   Family History  Problem Relation Age of Onset  . Hypertension Mother   . Diabetes Mother   . Hypertension Maternal Grandmother   . Pancreatic cancer Paternal Grandmother        diagnosed in her 65s  . Ovarian cancer Maternal Aunt        maternal grandmother's sister  . Breast cancer Paternal Aunt        diagnosed in late 64s early 8s  . Parkinsonism Paternal Grandfather     DRUG ALLERGIES:   Allergies  Allergen Reactions  . Adhesive [Tape] Other (See Comments)    Redness:Please use paper tape    REVIEW OF SYSTEMS:  Review of Systems  Constitutional: Negative for chills, fever and weight loss.  HENT: Negative for ear discharge, ear  pain and nosebleeds.   Eyes: Negative for blurred vision, pain and discharge.  Respiratory: Negative for sputum production, shortness of breath, wheezing and stridor.   Cardiovascular: Negative for chest pain, palpitations, orthopnea and PND.  Gastrointestinal: Negative for abdominal pain, diarrhea, nausea and vomiting.  Genitourinary: Negative for frequency and urgency.  Musculoskeletal: Positive for neck pain. Negative for back pain and joint pain.  Neurological: Positive for loss of consciousness and weakness. Negative for sensory change, speech  change and focal weakness.  Psychiatric/Behavioral: Negative for depression and hallucinations. The patient is not nervous/anxious.      MEDICATIONS AT HOME:   Prior to Admission medications   Medication Sig Start Date End Date Taking? Authorizing Provider  ALPRAZolam (XANAX) 0.25 MG tablet TAKE ONE TABLET BY MOUTH TWICE DAILY AS NEEDED FOR ANXIETY 07/24/12  Yes [provider]  amLODipine (NORVASC) 5 MG tablet Take 1 tablet (5 mg total) by mouth daily. 04/02/18  Yes Earlie Server, MD  apixaban (ELIQUIS) 2.5 MG TABS tablet Take 1 tablet (2.5 mg total) by mouth 2 (two) times daily. 04/02/18  Yes Earlie Server, MD  Ascorbic Acid (VITAMIN C) 100 MG tablet Take 100 mg by mouth daily.     Yes [provider]  Calcium Carbonate-Vitamin D (CALTRATE 600+D PO) Take 1 tablet by mouth daily.   Yes [provider]  diazepam (VALIUM) 10 MG tablet Take 1 tablet (10 mg total) by mouth at bedtime as needed for anxiety. 09/18/17  Yes [name], MD  dronabinol (MARINOL) 5 MG capsule Take 1 capsule (5 mg total) by mouth 2 (two) times daily before lunch and supper. 04/04/18  Yes Earlie Server, MD  esomeprazole (NEXIUM) 40 MG capsule Take 40 mg by mouth daily as needed.    Yes [provider]  ferrous sulfate 325 (65 FE) MG EC tablet Take 1 tablet (325 mg total) by mouth daily. 01/05/18  Yes Earlie Server, MD  fulvestrant (FASLODEX) 250 MG/5ML injection  Inject 250 mg into the muscle every 14 (fourteen) days. One injection each buttock over 1-2 minutes. Warm prior to use.   Yes [provider]  furosemide (LASIX) 20 MG tablet Take 1 tablet (20 mg total) by mouth daily as needed. 01/05/18  Yes Earlie Server, MD  goserelin (ZOLADEX) 3.6 MG injection Inject 3.6 mg into the skin every 28 (twenty-eight) days.   Yes [provider]  lidocaine-prilocaine (EMLA) cream Apply to affected area once 12/21/17  Yes Earlie Server, MD  loperamide (IMODIUM A-D) 2 MG tablet Take 2 mg by mouth 4 (four) times daily as needed for diarrhea or loose stools.    Yes [provider]  Multiple Vitamin (MULTIVITAMIN) tablet Take 1 tablet by mouth daily.     Yes [provider]  ondansetron (ZOFRAN) 4 MG tablet TAKE 1 TABLET BY MOUTH EVERY 8 HOURS AS NEEDED FOR NAUSEA OR VOMITING 11/22/17  Yes [provider]  oxyCODONE (OXY IR/ROXICODONE) 5 MG immediate release tablet Take 1 tablet (5 mg total) by mouth every 6 (six) hours as needed for moderate pain or severe pain. 04/18/18  Yes Earlie Server, MD  oxyCODONE (OXYCONTIN) 10 mg 12 hr tablet Take 1 tablet (10 mg total) by mouth every 12 (twelve) hours. 04/18/18  Yes Earlie Server, MD  prochlorperazine (COMPAZINE) 10 MG tablet Take 1 tablet (10 mg total) by mouth every 6 (six) hours as needed (Nausea or vomiting). 04/17/18  Yes Earlie Server, MD  scopolamine (TRANSDERM-SCOP) 1 MG/3DAYS Place 1 patch (1.5 mg total) onto the skin every 3 (three) days. 03/19/18  Yes Earlie Server, MD  senna (SENOKOT) 8.6 MG TABS tablet Take 2 tablets (17.2 mg total) by mouth daily. 04/17/18  Yes Earlie Server, MD      VITAL SIGNS:  Blood pressure 130/89, pulse 84, temperature 98.4 F (36.9 C), temperature source Oral, resp. rate (!) 21, height 5\' 5"  (1.651 m), weight 66.2 kg, last menstrual period 11/28/2011, SpO2 93 %.  PHYSICAL EXAMINATION:  GENERAL:  58 y.o.-year-old patient lying in the bed with no acute distress. He is chronically  ill EYES: Pupils equal, round, reactive to light and accommodation. No scleral icterus. Extraocular muscles intact.  HEENT: Head atraumatic, normocephalic. Oropharynx and nasopharynx clear.  NECK:  Supple, no jugular venous distention. No thyroid enlargement, no tenderness.  LUNGS: Normal breath sounds bilaterally, no wheezing, rales,rhonchi or crepitation. No use of accessory muscles of respiration.  CARDIOVASCULAR: S1, S2 normal. No murmurs, rubs, or gallops.  ABDOMEN: Soft, nontender, nondistended. Bowel sounds present. No organomegaly or mass.  EXTREMITIES: No pedal edema, cyanosis, or clubbing.  NEUROLOGIC: Cranial nerves II through XII are intact. Muscle strength 4/5 in all extremities. Sensation intact. Gait not checked.  PSYCHIATRIC: The patient is alert and oriented x 2.  SKIN: No obvious rash, lesion, or ulcer.   LABORATORY PANEL:   CBC Recent Labs  Lab 04/18/18 1503  WBC 5.4  HGB 11.1*  HCT 34.3*  PLT 385   ------------------------------------------------------------------------------------------------------------------  Chemistries  Recent Labs  Lab 04/18/18 1503  NA 134*  K 3.6  CL 99  CO2 26  GLUCOSE 138*  BUN 11  CREATININE 0.82  CALCIUM 8.3*  AST 72*  ALT 40  ALKPHOS 175*  BILITOT 0.3   ------------------------------------------------------------------------------------------------------------------  Cardiac Enzymes Recent Labs  Lab 04/18/18 1503  TROPONINI <0.03   ------------------------------------------------------------------------------------------------------------------  RADIOLOGY:  Ct Head Wo Contrast  Result Date: 04/18/2018 CLINICAL DATA:  Syncopal episode, seizures, VP shunt EXAM: CT HEAD WITHOUT CONTRAST TECHNIQUE: Contiguous axial images were obtained from the base of the skull through the vertex without intravenous contrast. COMPARISON:  03/05/2018 MRI FINDINGS: Brain: Right frontal shunt tubing terminates in right lateral  ventricle close to midline. There is diffuse enlargement of the ventricular system, difficult to exclude mild hydrocephalus or shunt malfunction. Ventricles remain enlarged and similar to the 03/05/2018 MR. Mild periventricular white matter microvascular ischemic changes. No acute intracranial hemorrhage, mass lesion, definite infarction, midline shift, herniation, extra-axial fluid collection. No focal mass effect or edema. Cisterns are patent. Cerebellar atrophy as well. Vascular: No hyperdense vessel or unexpected calcification. Skull: Normal. Negative for fracture or focal lesion. Sinuses/Orbits: No acute finding. Other: None. IMPRESSION: Interval right frontal shunt insertion. Similar diffuse ventricular enlargement of the entire ventricular system. Difficult to exclude hydrocephalus or shunt malfunction. Minor white matter microvascular changes Stable brain atrophy pattern. No other acute intracranial abnormality or hemorrhage. Electronically Signed   By: Jerilynn Mages.  Shick M.D.   On: 04/18/2018 20:20   Dg Abd 2 Views  Result Date: 04/18/2018 CLINICAL DATA:  Initial evaluation for constipation. EXAM: ABDOMEN - 2 VIEW COMPARISON:  None. FINDINGS: Paucity of gas limits evaluation of the bowels. No evidence for obstruction. No free air on upright view. No abnormal bowel wall thickening. Moderate retained stool within the colon, most notable at the splenic flexure. No soft tissue mass or abnormal calcification. Right-sided percutaneous nephrostomy tube in place. No acute osseous abnormality. Right basilar atelectasis noted. Right-sided Port-A-Cath noted as well. IMPRESSION: Nonobstructive bowel gas pattern with moderate retained stool within the colon. Electronically Signed   By: Jeannine Boga M.D.   On: 04/18/2018 17:44    EKG:    IMPRESSION AND PLAN:   Mary [name]  is a 58 y.o. female with a known history of metastatic breast cancer with little meningeal meds recently had placement of [facility]  [facility]  neurosurgery April 13, 2017. Patient will get her next chemo on Friday through that reservoir . She follows with Dr. Tasia Catchings the  cancer center. Patient has been having episodes of blanking out spells with intermittent loss of consciousness.   1. Intermittent loss of consciousness on and off-- etiology unclear -patient has history of lymph to meningeal meds from breast cancer she is currently getting chemotherapy through the [place] [facility] was recently placed at [facility] on January 24 -EEG in the morning -neurology to see patient. Secure chat message sent to Dr. Doy Mince -CT had no acute changes from previous CT  2. Chronic pain secondary to metastatic breast cancer -continue home dose pain meds and PRN Dilaudid  3. Chronic anxiety continue Xanax  4. Patient has nephrectomy tube paste on the right secondary to hydronephrosis. - She is scheduled to have ultrasound of the kidneys tomorrow. Since patient is here in the hospital I'll do the ultrasound.  All the records are reviewed and case discussed with ED provider.   CODE STATUS:full code  TOTAL TIME TAKING CARE OF THIS PATIENT: *50* minutes.    Fritzi Mandes M.D on 04/18/2018 at 9:26 PM  Between 7am to 6pm - Pager - [phone]  After 6pm go to [email or web address] - password [email or web address]  [facility]  Office  [phone]  CC: Primary care physician; [name], MD

## 2018-04-18 NOTE — ED Notes (Signed)
Transporting pt to room 122-1C

## 2018-04-18 NOTE — ED Triage Notes (Signed)
Pt arrives to ED via [facility] for syncopal episode. Pt has metastatic breast CA, has brain port. Arrives to ED with R chest port and R nephrostomy tube. No sticks or BP in R arm. Arrives A&O, talking in complete sentences. EMS reports that pt has had many syncopal episodes over last year but are increasing in frequency. No distress noted upon arrival.   EMS also reports pt is possibly impacted. Family tried suppository at home without relief.

## 2018-04-19 ENCOUNTER — Inpatient Hospital Stay: Payer: 59

## 2018-04-19 ENCOUNTER — Ambulatory Visit: Payer: 59 | Admitting: Oncology

## 2018-04-19 ENCOUNTER — Other Ambulatory Visit: Payer: 59

## 2018-04-19 ENCOUNTER — Ambulatory Visit: Payer: 59

## 2018-04-19 DIAGNOSIS — Z515 Encounter for palliative care: Secondary | ICD-10-CM

## 2018-04-19 DIAGNOSIS — G893 Neoplasm related pain (acute) (chronic): Secondary | ICD-10-CM

## 2018-04-19 DIAGNOSIS — K5900 Constipation, unspecified: Secondary | ICD-10-CM

## 2018-04-19 DIAGNOSIS — C50412 Malignant neoplasm of upper-outer quadrant of left female breast: Secondary | ICD-10-CM

## 2018-04-19 LAB — URINALYSIS, COMPLETE (UACMP) WITH MICROSCOPIC
BILIRUBIN URINE: NEGATIVE
Bacteria, UA: NONE SEEN
Glucose, UA: NEGATIVE mg/dL
Hgb urine dipstick: NEGATIVE
Ketones, ur: 5 mg/dL — AB
Leukocytes, UA: NEGATIVE
Nitrite: NEGATIVE
Protein, ur: 30 mg/dL — AB
Specific Gravity, Urine: 1.043 — ABNORMAL HIGH (ref 1.005–1.030)
pH: 5 (ref 5.0–8.0)

## 2018-04-19 MED ORDER — PROMETHAZINE HCL 25 MG/ML IJ SOLN
12.5000 mg | Freq: Four times a day (QID) | INTRAMUSCULAR | Status: DC | PRN
  Administered 2018-04-19 – 2018-04-23 (×2): 12.5 mg via INTRAVENOUS
  Filled 2018-04-19 (×2): qty 1

## 2018-04-19 MED ORDER — HYDROMORPHONE HCL 2 MG PO TABS
1.0000 mg | ORAL_TABLET | ORAL | Status: DC | PRN
  Administered 2018-04-19 – 2018-04-20 (×3): 1 mg via ORAL
  Filled 2018-04-19 (×3): qty 1

## 2018-04-19 MED ORDER — LEVETIRACETAM 100 MG/ML PO SOLN
500.0000 mg | Freq: Two times a day (BID) | ORAL | Status: DC
  Administered 2018-04-19 – 2018-04-24 (×9): 500 mg via ORAL
  Filled 2018-04-19 (×16): qty 5

## 2018-04-19 MED ORDER — OXYCODONE HCL 5 MG PO TABS: 5.0000 mg | ORAL_TABLET | Freq: Four times a day (QID) | ORAL | Status: DC | PRN

## 2018-04-19 MED ORDER — SENNA 8.6 MG PO TABS
2.0000 | ORAL_TABLET | Freq: Two times a day (BID) | ORAL | Status: DC
  Administered 2018-04-19 – 2018-04-20 (×2): 17.2 mg via ORAL
  Filled 2018-04-19 (×2): qty 2

## 2018-04-19 MED ORDER — BISACODYL 10 MG RE SUPP
10.0000 mg | Freq: Every day | RECTAL | Status: DC | PRN
  Filled 2018-04-19: qty 1

## 2018-04-19 MED ORDER — PROMETHAZINE HCL 25 MG/ML IJ SOLN
12.5000 mg | Freq: Once | INTRAMUSCULAR | Status: AC
  Administered 2018-04-19: 12.5 mg via INTRAVENOUS
  Filled 2018-04-19: qty 1

## 2018-04-19 MED ORDER — DEXAMETHASONE 4 MG PO TABS
4.0000 mg | ORAL_TABLET | Freq: Two times a day (BID) | ORAL | Status: DC
  Administered 2018-04-19 – 2018-04-24 (×10): 4 mg via ORAL
  Filled 2018-04-19 (×11): qty 1

## 2018-04-19 MED ORDER — POTASSIUM CHLORIDE CRYS ER 20 MEQ PO TBCR
40.0000 meq | EXTENDED_RELEASE_TABLET | Freq: Every day | ORAL | Status: DC
  Administered 2018-04-19 – 2018-04-21 (×3): 40 meq via ORAL
  Filled 2018-04-19: qty 4
  Filled 2018-04-19 (×2): qty 2

## 2018-04-19 MED ORDER — FUROSEMIDE 10 MG/ML IJ SOLN
40.0000 mg | Freq: Every day | INTRAMUSCULAR | Status: DC
  Administered 2018-04-19 – 2018-04-24 (×6): 40 mg via INTRAVENOUS
  Filled 2018-04-19 (×6): qty 4

## 2018-04-19 NOTE — Consult Note (Signed)
[place]  [phone] [phone]   Name: Mary [name] Date: 04/19/2018 MRN: [id number]  DOB: Aug 14, 1960  Patient Care Team: [name], MD as PCP - General (Internal Medicine) Barrington Ellison, RN as [facility]    REASON FOR CONSULTATION: Palliative Care consult requested for this 58 y.o. female with multiple medical problems including stage IV breast cancer (initially diagnosed in August 2013) status post neoadjuvant chemotherapy and bilateral mastectomy and adjuvant RT.  Patient was found to have disease with recurrence in September 2019 when she developed obstructive nephropathy requiring nephrostomy tubes.  Patient was found to have metastatic disease to bone, bladder wall, and peritoneum.  She was started on Taxol treatment.  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  She is status post Ommaya reservoir (placed on 04/13/2018) with plan to receive intrathecal methotrexate.  PMH also notable for CKD stage II, history of DVT (11/2017) on Eliquis, ascites requiring paracentesis, IBS, anxiety, and depression.  Patient was recently hospitalized 04/07/19/18/20 with lysed weakness and acute kidney injury secondary to dehydration.  Patient's performance status has rapidly declined and she is now bedbound.  She has had transient periods of unresponsiveness and was admitted to the hospital on 04/18/2018 for evaluation.  Palliative care was consulted to help address goals.   SOCIAL HISTORY:    Patient is married and lives at home with her husband.  She has no children.  Her mother and father are both living and involved in her care.  Patient has 24/7 sitters in the home.  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  ADVANCE DIRECTIVES:  Patient has both healthcare power of attorney and living will documents on file dated 03/12/2018.  Her healthcare power of attorney is her husband, Daneli Butkiewicz.  CODE STATUS: Full code  PAST MEDICAL HISTORY: Past Medical History:  Diagnosis Date  . Anxiety    takes Xanax prn  . Breast cancer [facility]) August 2013   bilateral, er/pr+, bladder (02/2018..mets from breast ca)  . Chronic kidney disease 01/2018   recent shut down of kidneys with labs out of whack  . Complication of anesthesia    pt states she had the shakes extremely bad  . Cough    at night d/t reflux;nothing productive  . Dental crowns present    x 4  . Depression   . GERD (gastroesophageal reflux disease)    takes Nexium daily  . History of bronchitis    08/2011  . History of colon polyps   . Hot flashes, menopausal 08/08/2012  . IBS (irritable bowel syndrome)    immodium prn  . Insomnia    takes Ambien nightly  . Irritable bowel syndrome   . 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 Neutropenia, drug-induced ([place]) 12/15/2011  . Nocturia   . Plantar fasciitis   . S/P radiation therapy 07/03/2012 through 08/18/2038                                                        Right chest wall/regional lymph nodes 5040 cGy 28 sessions, right chest wall/mastectomy  scar boost 1000 cGy 5 sessions                          . Status post chemotherapy    FEC 100 starting on 12/08/2011 - 01/18/12/single agent Taxol x12 weeks from 02/02/2012 through January 2014   . Urinary frequency   . Use of tamoxifen (Nolvadex)    now stopped. will be starting ibrance on 03/05/18  . Vertigo     PAST SURGICAL HISTORY:  Past Surgical History:  Procedure Laterality Date  . BREAST LUMPECTOMY  2008   left  . COLONOSCOPY    . COLONOSCOPY WITH PROPOFOL N/A 10/10/2017   Procedure: COLONOSCOPY WITH PROPOFOL;  Surgeon: Lucilla Lame, MD;  Location: [facility] ENDOSCOPY;  Service: Endoscopy;  Laterality:  N/A;  . CYSTOSCOPY WITH STENT PLACEMENT Bilateral 12/17/2017   Procedure: CYSTOSCOPY WITH STENT PLACEMENT;  Surgeon: Hollice Espy, MD;  Location: [facility] ORS;  Service: Urology;  Laterality: Bilateral;  . IR NEPHROSTOMY EXCHANGE LEFT  02/13/2018  . IR NEPHROSTOMY EXCHANGE RIGHT  01/02/2018  . IR NEPHROSTOMY EXCHANGE RIGHT  02/13/2018  . IR NEPHROSTOMY PLACEMENT LEFT  12/19/2017  . IR NEPHROSTOMY PLACEMENT RIGHT  12/19/2017  . IR NEPHROSTOMY PLACEMENT RIGHT  04/06/2018  . LUMBAR PUNCTURE  03/01/2018  . MASTECTOMY Bilateral 2013  . MASTECTOMY WITH AXILLARY LYMPH NODE DISSECTION Right 05/18/2012   Procedure: MASTECTOMY WITH AXILLARY LYMPH NODE DISSECTION;  Surgeon: Adin Hector, MD;  Location: [place];  Service: General;  Laterality: Right;  Bilateral Total Mastectomy , right axillary lymph node dissection left axillary sentinel node biopsy, removal of Porta Cath  . NEPHROSTOMY TUBE REMOVAL Bilateral 03/01/2018   removed tubes in dr. Erlene Quan office  . PORT-A-CATH REMOVAL N/A 05/18/2012   Procedure: REMOVAL PORT-A-CATH;  Surgeon: Adin Hector, MD;  Location: [place];  Service: General;  Laterality: N/A;  . PORTA CATH INSERTION N/A 12/26/2017   Procedure: PORTA CATH INSERTION;  Surgeon: Katha Cabal, MD;  Location: [place] [facility];  Service: Cardiovascular;  Laterality: N/A;  . PORTACATH PLACEMENT  12/02/2011   Procedure: INSERTION PORT-A-CATH;  Surgeon: Adin Hector, MD;  Location: [place];  Service: General;  Laterality: N/A;  insertion port a cath with fluoroscopy  . SIMPLE MASTECTOMY WITH AXILLARY SENTINEL NODE BIOPSY Left 05/18/2012   Procedure: SIMPLE MASTECTOMY WITH AXILLARY SENTINEL NODE BIOPSY;  Surgeon: Adin Hector, MD;  Location: [place];  Service: General;  Laterality: Left;    HEMATOLOGY/ONCOLOGY HISTORY:    Primary cancer of upper outer quadrant of left female breast ([place])   11/17/2011 Initial Diagnosis    Cancer of upper-outer quadrant of female  breast diagnosed when pt presented with Nipple flattening and palpable lesion.    12/08/2011 - 03/29/2012 Chemotherapy    Neo adjuvant FEC X 4 followed by Weekly Taxol X 12    05/18/2012 Surgery    Bilateral Mastectomies: Right revealed invasive  Lobular carcinoma measuring 2.3 cm 8 of 17 lymph nodes were positive (T2 N2A). 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    07/04/2012 - 08/17/2012 Radiation Therapy    Rt Breast irradiation    08/19/2012 -  Anti-estrogen oral therapy    Initially Tamoxifen + Zolodex and changed to Aromasin + Zolodex Monthly since 08/2013    12/22/2017 -  Chemotherapy    The patient had PACLitaxel (TAXOL) 144 mg in sodium chloride 0.9 % 250 mL chemo infusion (</=  74m/m2), 80 mg/m2 = 144 mg, Intravenous,  Once, 3 of 4 cycles Administration: 144 mg (12/22/2017), 144 mg (12/29/2017), 144 mg (01/05/2018), 144 mg (01/19/2018), 144 mg (01/26/2018), 144 mg (02/02/2018), 144 mg (04/05/2018), 144 mg (04/17/2018)  for chemotherapy treatment.     04/20/2018 -  Chemotherapy    The patient had methotrexate (PF) 12 mg in sodium chloride 0.9 % INTRATHECAL chemo injection, , Intrathecal,  Once, 0 of 6 cycles  for chemotherapy treatment.      [facility] ([facility]   12/19/2017 Initial Diagnosis    Metastatic breast cancer ([facility]    12/22/2017 -  Chemotherapy    The patient had PACLitaxel (TAXOL) 144 mg in sodium chloride 0.9 % 250 mL chemo infusion (</= 844mm2), 80 mg/m2 = 144 mg, Intravenous,  Once, 3 of 4 cycles Administration: 144 mg (12/22/2017), 144 mg (12/29/2017), 144 mg (01/05/2018), 144 mg (01/19/2018), 144 mg (01/26/2018), 144 mg (02/02/2018), 144 mg (04/05/2018), 144 mg (04/17/2018)  for chemotherapy treatment.     04/20/2018 -  Chemotherapy    The patient had methotrexate (PF) 12 mg in sodium chloride 0.9 % INTRATHECAL chemo injection, , Intrathecal,  Once, 0 of 6 cycles  for chemotherapy treatment.      [place]  [place]  12/22/2017 -  Chemotherapy    The patient had PACLitaxel (TAXOL) 144 mg in sodium chloride 0.9 % 250 mL chemo infusion (</= 8061m2), 80 mg/m2 = 144 mg, Intravenous,  Once, 3 of 4 cycles Administration: 144 mg (12/22/2017), 144 mg (12/29/2017), 144 mg (01/05/2018), 144 mg (01/19/2018), 144 mg (01/26/2018), 144 mg (02/02/2018), 144 mg (04/05/2018), 144 mg (04/17/2018)  for chemotherapy treatment.     04/17/2018 Initial Diagnosis    Le[facility]eal metastases ([facility]  04/20/2018 -  Chemotherapy    The patient had methotrexate (PF) 12 mg in sodium chloride 0.9 % INTRATHECAL chemo injection, , Intrathecal,  Once, 0 of 6 cycles  for chemotherapy treatment.      ALLERGIES:  is allergic to adhesive [tape].  MEDICATIONS:  Current Facility-Administered Medications  Medication Dose Route Frequency Provider Last Rate Last Dose  . acetaminophen (TYLENOL) tablet 650 mg  650 mg Oral Q6H PRN PatFritzi MandesD       Or  . acetaminophen (TYLENOL) suppository 650 mg  650 mg Rectal Q6H PRN PatFritzi MandesD      . ALPRAZolam (XADuanne Moronablet 0.25 mg  0.25 mg Oral BID PRN PatFritzi MandesD      . amLODipine (NORVASC) tablet 5 mg  5 mg Oral Daily PatFritzi MandesD   5 mg at 04/19/18 0829  . apixaban (ELIQUIS) tablet 2.5 mg  2.5 mg Oral BID PatFritzi MandesD      . diazepam (VALIUM) tablet 10 mg  10 mg Oral QHS PRN PatFritzi MandesD      . dronabinol (MARINOL) capsule 5 mg  5 mg Oral BID AC PatFritzi MandesD      . ferrous sulfate tablet 325 mg  325 mg Oral Daily PatFritzi MandesD   325 mg at 04/19/18 0827  . furosemide (LASIX) tablet 20 mg  20 mg Oral Daily PRN PatFritzi MandesD      . HYDROmorphone (DILAUDID) injection 1 mg  1 mg Intravenous Q8H PRN PatFritzi MandesD   1 mg at 04/19/18 0554  . loperamide (IMODIUM) capsule 2 mg  2 mg Oral QID PRN PatFritzi MandesD      . multivitamin with minerals tablet 1 tablet  1 tablet Oral Daily Fritzi Mandes, MD   1 tablet at 04/19/18 0827  . ondansetron (ZOFRAN) tablet 4 mg  4 mg Oral Q6H  PRN Fritzi Mandes, MD   4 mg at 04/19/18 5621   Or  . ondansetron [facility]) injection 4 mg  4 mg Intravenous Q6H PRN Fritzi Mandes, MD      . oxyCODONE (Oxy IR/ROXICODONE) immediate release tablet 5 mg  5 mg Oral Q6H PRN Oswald Hillock, RPH      . oxyCODONE (OXYCONTIN) 12 hr tablet 10 mg  10 mg Oral Q12H Fritzi Mandes, MD   10 mg at 04/19/18 3086  . pantoprazole (PROTONIX) EC tablet 40 mg  40 mg Oral Daily Fritzi Mandes, MD   40 mg at 04/19/18 0830  . polyethylene glycol (MIRALAX / GLYCOLAX) packet 17 g  17 g Oral Daily PRN Fritzi Mandes, MD      . prochlorperazine (COMPAZINE) tablet 10 mg  10 mg Oral Q6H PRN Fritzi Mandes, MD      . scopolamine (TRANSDERM-SCOP) 1 MG/3DAYS 1.5 mg  1 patch Transdermal Q72H Fritzi Mandes, MD      . senna (SENOKOT) tablet 17.2 mg  2 tablet Oral Daily Fritzi Mandes, MD   17.2 mg at 04/19/18 5784  . vitamin C (ASCORBIC ACID) tablet 250 mg  250 mg Oral Daily Fritzi Mandes, MD   250 mg at 04/19/18 0828    VITAL SIGNS: BP 127/77 (BP Location: Left Arm)   Pulse 76   Temp 98.2 F (36.8 C) (Oral)   Resp 17   Ht '5\' 5"'$  (1.651 m)   Wt 146 lb (66.2 kg)   LMP 11/28/2011   SpO2 93%   BMI 24.30 kg/m  Filed Weights   04/18/18 1500  Weight: 146 lb (66.2 kg)    Estimated body mass index is 24.3 kg/m as calculated from the following:   Height as of this encounter: '5\' 5"'$  (1.651 m).   Weight as of this encounter: 146 lb (66.2 kg).  LABS: CBC:    Component Value Date/Time   WBC 5.4 04/18/2018 1503   HGB 11.1 (L) 04/18/2018 1503   HGB 14.8 04/29/2014 1327   HCT 34.3 (L) 04/18/2018 1503   HCT 43.8 04/29/2014 1327   PLT 385 04/18/2018 1503   PLT 206 04/29/2014 1327   MCV 92.0 04/18/2018 1503   MCV 95.0 04/29/2014 1327   NEUTROABS 3.0 04/17/2018 0815   NEUTROABS 3.9 04/29/2014 1327   LYMPHSABS 0.6 (L) 04/17/2018 0815   LYMPHSABS 1.7 04/29/2014 1327   MONOABS 0.4 04/17/2018 0815   MONOABS 0.6 04/29/2014 1327   EOSABS 0.0 04/17/2018 0815   EOSABS 0.1 04/29/2014 1327   BASOSABS  0.1 04/17/2018 0815   BASOSABS 0.0 04/29/2014 1327   Comprehensive Metabolic Panel:    Component Value Date/Time   NA 134 (L) 04/18/2018 1503   NA 138 12/16/2017   NA 141 04/29/2014 1327   K 3.6 04/18/2018 1503   K 4.0 04/29/2014 1327   CL 99 04/18/2018 1503   CL 106 08/20/2012 0802   CO2 26 04/18/2018 1503   CO2 24 04/29/2014 1327   BUN 11 04/18/2018 1503   BUN 30 (A) 12/16/2017   BUN 15.7 04/29/2014 1327   CREATININE 0.82 04/18/2018 1503   CREATININE 1.58 (H) 12/15/2017 1525   CREATININE 0.9 04/29/2014 1327   GLUCOSE 138 (H) 04/18/2018 1503   GLUCOSE 97 04/29/2014 1327   GLUCOSE 100 (H) 08/20/2012 0802   CALCIUM 8.3 (L) 04/18/2018  1503   CALCIUM 9.7 04/29/2014 1327   AST 72 (H) 04/18/2018 1503   AST 21 04/29/2014 1327   ALT 40 04/18/2018 1503   ALT 30 04/29/2014 1327   ALKPHOS 175 (H) 04/18/2018 1503   ALKPHOS 124 04/29/2014 1327   BILITOT 0.3 04/18/2018 1503   BILITOT 0.34 04/29/2014 1327   PROT 6.4 (L) 04/18/2018 1503   PROT 7.6 04/29/2014 1327   ALBUMIN 2.9 (L) 04/18/2018 1503   ALBUMIN 4.1 04/29/2014 1327    RADIOGRAPHIC STUDIES: Ct Head Wo Contrast  Result Date: 04/18/2018 CLINICAL DATA:  Syncopal episode, seizures, VP shunt EXAM: CT HEAD WITHOUT CONTRAST TECHNIQUE: Contiguous axial images were obtained from the base of the skull through the vertex without intravenous contrast. COMPARISON:  03/05/2018 MRI FINDINGS: Brain: Right frontal shunt tubing terminates in right lateral ventricle close to midline. There is diffuse enlargement of the ventricular system, difficult to exclude mild hydrocephalus or shunt malfunction. Ventricles remain enlarged and similar to the 03/05/2018 MR. Mild periventricular white matter microvascular ischemic changes. No acute intracranial hemorrhage, mass lesion, definite infarction, midline shift, herniation, extra-axial fluid collection. No focal mass effect or edema. Cisterns are patent. Cerebellar atrophy as well. Vascular: No  hyperdense vessel or unexpected calcification. Skull: Normal. Negative for fracture or focal lesion. Sinuses/Orbits: No acute finding. Other: None. IMPRESSION: Interval right frontal shunt insertion. Similar diffuse ventricular enlargement of the entire ventricular system. Difficult to exclude hydrocephalus or shunt malfunction. Minor white matter microvascular changes Stable brain atrophy pattern. No other acute intracranial abnormality or hemorrhage. Electronically Signed   By: Jerilynn Mages.  Shick M.D.   On: 04/18/2018 20:20   Ct Chest Wo Contrast  Result Date: 04/04/2018 CLINICAL DATA:  Metastatic breast cancer. Status post bilateral mastectomy. EXAM: CT CHEST, ABDOMEN AND PELVIS WITHOUT CONTRAST TECHNIQUE: Multidetector CT imaging of the chest, abdomen and pelvis was performed following the standard protocol without IV contrast. COMPARISON:  02/06/2018 FINDINGS: CT CHEST FINDINGS Cardiovascular: The heart size is normal. No substantial pericardial effusion. Right Port-A-Cath tip is at the SVC/RA junction. Mediastinum/Nodes: No mediastinal lymphadenopathy. There is no hilar lymphadenopathy. Esophagus appears diffusely fluid-filled. There is no axillary lymphadenopathy. Lungs/Pleura: The central tracheobronchial airways are patent. Stable appearance of apparent scarring in the right apex. Mild atelectasis in the right middle and lower lobes is new in the interval. There is a new small right pleural effusion. No suspicious nodule or mass in the left lung. Minimal atelectasis noted left lower lobe. Tiny left pleural effusion evident. Musculoskeletal: Stable similar diffuse heterogeneous bone mineralization CT ABDOMEN PELVIS FINDINGS Hepatobiliary: No focal abnormality in the liver parenchyma. High attenuation material in the gallbladder lumen may be related to stones or sludge. No intrahepatic or extrahepatic biliary dilation. Pancreas: No focal mass lesion. No dilatation of the main duct. No intraparenchymal cyst. No  peripancreatic edema. Spleen: No splenomegaly. No focal mass lesion. Adrenals/Urinary Tract: Similar to the thickening of the left adrenal gland. Thickening of the right adrenal gland is also similar to prior. Bilateral percutaneous nephrostomy tubes been removed in the interval. There is mild to moderate right hydronephrosis with mild fullness of the left intrarenal collecting system. Neither ureter is distended beyond its midpoint. Urinary bladder is decompressed. Stomach/Bowel: Stomach is nondistended. No gastric wall thickening. No evidence of outlet obstruction. Duodenum is normally positioned as is the ligament of Treitz. No small bowel wall thickening. No small bowel dilatation. The terminal ileum is normal. The appendix is not visualized, but there is no edema or inflammation in the region  of the cecum. Colon is diffusely decompressed. Vascular/Lymphatic: No abdominal aortic aneurysm. No retroperitoneal lymphadenopathy. No pelvic sidewall lymphadenopathy. Reproductive: But The uterus is unremarkable. There is no adnexal mass. Other: Moderate to large volume ascites is new in the interval. There is vascular congestion/nodularity in the gastrocolic ligament and omentum suggestion of peritoneal thickening seen in the left pelvis (96/2) and posterior pelvis bilaterally (102/2). Musculoskeletal: Similar appearance of heterogeneous bone mineralization with some more densely sclerotic lesions identified throughout as examples, see L5 vertebral body (84/2) left pubic bone (112/2) and posterior left inferior pubic ramus (118/2). IMPRESSION: 1. Interval development of moderate to large volume ascites with congestion and nodularity in the gastrocolic ligament and omentum. Imaging features highly concerning for metastatic involvement. 2. Interval removal of percutaneous nephrostomy tubes with mild to moderate right hydronephrosis and mild fullness of the left intrarenal collecting system. 3. Diffuse heterogeneous bone  mineralization with scattered sclerotic lesion similar to prior and compatible with metastatic disease. 4. Small right and tiny left pleural effusions. 5. Urinary bladder decompressed on today's study and not well evaluated. Electronically Signed   By: Misty Stanley M.D.   On: 04/04/2018 11:55   US Renal  Result Date: 04/19/2018 CLINICAL DATA:  Nephrostomy status. EXAM: RENAL / URINARY TRACT ULTRASOUND COMPLETE COMPARISON:  None. FINDINGS: Right Kidney: Renal measurements: 11.6 x 5 x 4.3 cm = volume: 129 mL . Echogenicity within normal limits. Mild hydronephrosis is noted. Nephrostomy tube is present. Left Kidney: Renal measurements: 11.3 x 5.3 x 3.9 cm = volume: 121 mL. Echogenicity within normal limits. Moderate hydronephrosis is noted Bladder: Appears normal for degree of bladder distention. No ureteral jets are noted. Ascites are noted in the abdomen pelvis. Bilateral pleural effusions are noted. IMPRESSION: Right nephrostomy tube is identified. There is mild right hydronephrosis. Moderate left hydronephrosis is noted. Ascites identified in the abdomen and pelvis. Bilateral pleural effusions. Electronically Signed   By: Abelardo Diesel M.D.   On: 04/19/2018 11:35   US Renal  Result Date: 04/03/2018 CLINICAL DATA:  Acute kidney injury. EXAM: RENAL / URINARY TRACT ULTRASOUND COMPLETE COMPARISON:  CT of the abdomen and pelvis 02/06/2018 FINDINGS: Right Kidney: Renal measurements: 12.7 x 4.7 x 5.7 cm = volume: 171 mL. Moderate to severe recurrent right-sided hydronephrosis is present. No obstructing lesion or mass is present. Moderate renal thinning is present. Left Kidney: Renal measurements: 11.5 x 4.3 x 5.0 cm = volume: 129 mL. Moderate left-sided hydronephrosis is present. Parenchymal thickness is normal. Bladder: Under distended. Moderate diffuse abdominal ascites is present. Bilateral pleural effusions are noted. IMPRESSION: 1. Recurrent bilateral hydronephrosis, right greater than left. 2. Moderate  diffuse abdominal ascites. 3. Bilateral pleural effusions. Electronically Signed   By: San Morelle M.D.   On: 04/03/2018 16:48   US Paracentesis  Result Date: 04/06/2018 INDICATION: Metastatic breast cancer, ascites, abdominal distension EXAM: ULTRASOUND GUIDED RIGHT PARACENTESIS MEDICATIONS: 1% LIDOCAINE LOCAL COMPLICATIONS: None immediate. PROCEDURE: Informed written consent was obtained from the patient after a discussion of the risks, benefits and alternatives to treatment. A timeout was performed prior to the initiation of the procedure. Initial ultrasound scanning demonstrates a large amount of ascites within the right lower abdominal quadrant. The right lower abdomen was prepped and draped in the usual sterile fashion. 1% lidocaine with epinephrine was used for local anesthesia. Following this, a 6 Fr Safe-T-Centesis catheter was introduced. An ultrasound image was saved for documentation purposes. The paracentesis was performed. The catheter was removed and a dressing was applied. The patient tolerated  the procedure well without immediate post procedural complication. FINDINGS: A total of approximately 4.2 L of PERITONEAL fluid was removed. Samples were sent to the laboratory as requested by the clinical team. IMPRESSION: Successful ultrasound-guided paracentesis yielding 4.2 liters of peritoneal fluid. Electronically Signed   By: Jerilynn Mages.  Shick M.D.   On: 04/06/2018 11:19   Dg Abd 2 Views  Result Date: 04/18/2018 CLINICAL DATA:  Initial evaluation for constipation. EXAM: ABDOMEN - 2 VIEW COMPARISON:  None. FINDINGS: Paucity of gas limits evaluation of the bowels. No evidence for obstruction. No free air on upright view. No abnormal bowel wall thickening. Moderate retained stool within the colon, most notable at the splenic flexure. No soft tissue mass or abnormal calcification. Right-sided percutaneous nephrostomy tube in place. No acute osseous abnormality. Right basilar atelectasis noted.  Right-sided Port-A-Cath noted as well. IMPRESSION: Nonobstructive bowel gas pattern with moderate retained stool within the colon. Electronically Signed   By: Jeannine Boga M.D.   On: 04/18/2018 17:44   Ct Renal Stone Study  Result Date: 04/04/2018 CLINICAL DATA:  Metastatic breast cancer. Status post bilateral mastectomy. EXAM: CT CHEST, ABDOMEN AND PELVIS WITHOUT CONTRAST TECHNIQUE: Multidetector CT imaging of the chest, abdomen and pelvis was performed following the standard protocol without IV contrast. COMPARISON:  02/06/2018 FINDINGS: CT CHEST FINDINGS Cardiovascular: The heart size is normal. No substantial pericardial effusion. Right Port-A-Cath tip is at the SVC/RA junction. Mediastinum/Nodes: No mediastinal lymphadenopathy. There is no hilar lymphadenopathy. Esophagus appears diffusely fluid-filled. There is no axillary lymphadenopathy. Lungs/Pleura: The central tracheobronchial airways are patent. Stable appearance of apparent scarring in the right apex. Mild atelectasis in the right middle and lower lobes is new in the interval. There is a new small right pleural effusion. No suspicious nodule or mass in the left lung. Minimal atelectasis noted left lower lobe. Tiny left pleural effusion evident. Musculoskeletal: Stable similar diffuse heterogeneous bone mineralization CT ABDOMEN PELVIS FINDINGS Hepatobiliary: No focal abnormality in the liver parenchyma. High attenuation material in the gallbladder lumen may be related to stones or sludge. No intrahepatic or extrahepatic biliary dilation. Pancreas: No focal mass lesion. No dilatation of the main duct. No intraparenchymal cyst. No peripancreatic edema. Spleen: No splenomegaly. No focal mass lesion. Adrenals/Urinary Tract: Similar to the thickening of the left adrenal gland. Thickening of the right adrenal gland is also similar to prior. Bilateral percutaneous nephrostomy tubes been removed in the interval. There is mild to moderate right  hydronephrosis with mild fullness of the left intrarenal collecting system. Neither ureter is distended beyond its midpoint. Urinary bladder is decompressed. Stomach/Bowel: Stomach is nondistended. No gastric wall thickening. No evidence of outlet obstruction. Duodenum is normally positioned as is the ligament of Treitz. No small bowel wall thickening. No small bowel dilatation. The terminal ileum is normal. The appendix is not visualized, but there is no edema or inflammation in the region of the cecum. Colon is diffusely decompressed. Vascular/Lymphatic: No abdominal aortic aneurysm. No retroperitoneal lymphadenopathy. No pelvic sidewall lymphadenopathy. Reproductive: But The uterus is unremarkable. There is no adnexal mass. Other: Moderate to large volume ascites is new in the interval. There is vascular congestion/nodularity in the gastrocolic ligament and omentum suggestion of peritoneal thickening seen in the left pelvis (96/2) and posterior pelvis bilaterally (102/2). Musculoskeletal: Similar appearance of heterogeneous bone mineralization with some more densely sclerotic lesions identified throughout as examples, see L5 vertebral body (84/2) left pubic bone (112/2) and posterior left inferior pubic ramus (118/2). IMPRESSION: 1. Interval development of moderate to large volume  ascites with congestion and nodularity in the gastrocolic ligament and omentum. Imaging features highly concerning for metastatic involvement. 2. Interval removal of percutaneous nephrostomy tubes with mild to moderate right hydronephrosis and mild fullness of the left intrarenal collecting system. 3. Diffuse heterogeneous bone mineralization with scattered sclerotic lesion similar to prior and compatible with metastatic disease. 4. Small right and tiny left pleural effusions. 5. Urinary bladder decompressed on today's study and not well evaluated. Electronically Signed   By: Misty Stanley M.D.   On: 04/04/2018 11:55   Ir Nephrostomy  Placement Right  Result Date: 04/06/2018 INDICATION: Metastatic breast cancer, recurrent right hydronephrosis EXAM: ULTRASOUND AND FLUOROSCOPIC RIGHT NEPHROSTOMY INSERTION COMPARISON:  04/04/2018 MEDICATIONS: CIPRO 400 MG; The antibiotic was administered in an appropriate time frame prior to skin puncture. ANESTHESIA/SEDATION: Fentanyl 50 mcg IV; Versed 2 mg IV Moderate Sedation Time:  11 minutes The patient was continuously monitored during the procedure by the interventional radiology nurse under my direct supervision. CONTRAST:  10 cc-administered into the collecting system(s) FLUOROSCOPY TIME:  Fluoroscopy Time: 0 minutes 30 seconds ( mGy). COMPLICATIONS: None immediate. PROCEDURE: Informed written consent was obtained from the patient after a thorough discussion of the procedural risks, benefits and alternatives. All questions were addressed. Maximal Sterile Barrier Technique was utilized including caps, mask, sterile gowns, sterile gloves, sterile drape, hand hygiene and skin antiseptic. A timeout was performed prior to the initiation of the procedure. Previous imaging reviewed. Patient position prone. Right hydronephrotic kidney was localized and marked. Under sterile conditions anesthesia, an 18 gauge introducer needle was advanced into a dilated mid to lower pole calyx. Needle position confirmed with ultrasound. Images obtained for documentation. There was return of urine. Guidewire inserted followed by tract dilatation to insert a 10 French nephrostomy. Retention loop formed the renal pelvis. Contrast injection confirms position. Images obtained for documentation. Catheter secured with a prolene suture and a sterile dressing. Gravity drainage bag connected. No immediate complication. Patient tolerated the procedure well. IMPRESSION: Successful ultrasound and fluoroscopic 10 French right nephrostomy insertion Electronically Signed   By: Jerilynn Mages.  Shick M.D.   On: 04/06/2018 12:05    PERFORMANCE STATUS (ECOG)  : 4 - Bedbound  Review of Systems As noted above. Otherwise, a complete review of systems is negative.  Physical Exam General: Ill-appearing Cardiovascular: regular rate and rhythm Pulmonary: clear ant fields Abdomen: soft, nontender, + bowel sounds Extremities: no edema Skin: no rashes Neurological: Lethargic, wakes briefly when stimulated  IMPRESSION: I met with patient's husband, mother, father, and sitter.  Husband describes significant decline over the past several weeks.  Patient has become essentially bedbound and requires maximum assistance with activities of daily living.  Her oral intake has been quite poor.  She frequently only eats sips or bites.  Husband feels that her quality of life has become quite poor.  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  Despite her decline, husband remains optimistic that initiation of the intrathecal methotrexate will improve her symptoms, functioning, and quality of life.  Patient was scheduled to initiate intrathecal chemotherapy on 04/20/2018.  It is unclear if her hospitalization will change the treatment plan.  Patient has been somewhat lethargic today.  This may be related to medication effect from IV hydromorphone and Phenergan.  Neurology is following.  Patient is status post EEG today and has been started on Keppra for possible seizures.  Symptomatically, patient has had persistent pain.  Husband says that her current regimen has been relatively ineffective.  The OxyContin and oxycodone are still associated with frequent breakthrough pain.  He says patient has had more relief with the IV hydromorphone given during this hospitalization. Will rotate from oxycodone IR to hydromorphone po to see if this better controls her pain.   She has had persistent constipation.  Family reports last bowel movement was a week ago.  KUB was done in the ER and showed nonobstructive gas bowel pattern with moderate  amount of stool in the colon.  An attempt at manual disimpaction occurred in the ER but was unsuccessful.  I suspect opioid induced constipation.  Will increase bowel regimen.  Consideration could also be given for peripheral opioid receptor antagonist.  She has had persistent nausea.  Husband feels that Zofran he was giving at home was relatively ineffective.  She has received dose of Phenergan this morning and it caused some sedation.  Will start dexamethasone, which may help with nausea, pain, appetite, and fatigue.  I note that her abdominal ultrasound showed ascites.  Will speak with attending regarding therapeutic paracentesis.  The ascites may also be exacerbating her nausea.  Patient's performance status is quite poor.  Husband plans to take her home at time of discharge.  Patient would not qualify for hospice care in the setting of concurrent treatment.  Would instead recommend home health involvement.  I will also plan to follow her in my clinic.  Patient now has 24/7 caregivers in the home.  She would benefit from a hospital bed and other DME to facilitate care in the home.  Will consult care management.  Again, patient's husband remains optimistic and committed to continue treatment.  Overall, patient's prognosis seems quite poor.  Will follow and continue to address goals and decision making as we go forward.  Case discussed with Dr. Tasia Catchings.   PLAN: 1.  Continue current scope of treatment 2.  Start dexamethasone 4 mg p.o. twice daily 3.  Continue OxyContin 4.  Discontinue oxycodone IR 5.  Start hydromorphone 1 mg p.o. every 4 hours as needed for breakthrough pain 6.  Increase Senokot to 2 tablets twice daily 7.  Dulcolax suppository daily as needed 8.  Consider trial of peripheral opioid receptor antagonist 9.  Therapeutic paracentesis 10.  Care management consult 11.  Will need home health involvement 12.  Will need DME including a hospital bed at time of discharge 13.  We will  continue to follow.  Family will need more conversations regarding goals. 14.  Please consult oncology    Time Total: 70 minutes  Visit consisted of counseling and education dealing with the complex and emotionally intense issues of symptom management and palliative care in the setting of serious and potentially life-threatening illness.Greater than 50%  of this time was spent counseling and coordinating care related to the above assessment and plan.  Signed by: Altha Harm, PhD, NP-C [phone] (Work Cell)

## 2018-04-19 NOTE — Consult Note (Signed)
Reason for Consult:Episodes of altered awareness Referring Physician: Pyreddy  CC: Episodes of altered awareness  HPI: Mary [name] is an 58 y.o. female with a history of metastatic breast cancer, found to have leptomeningeal involvement, on chemotherapy, s/p Ommaya reservoir placement, who presents with report of frequent episodes of altered awareness.  Husband provides much of the history.  He reports that these episodes have been for the past month or so.  Initially were at a frequency of about one every other day.  Now they occur at a frequency of multiple times daily.  Frequently occur with transfers but can occur when she is lying down as well.  Patient just goes limp and will remain unresponsive for about 10 minutes.  There has not been any associated tonic-clonic movements or tongue biting.    Past Medical History:  Diagnosis Date  . Anxiety    takes Xanax prn  . Breast cancer [facility]) August 2013   bilateral, er/pr+, bladder (02/2018..mets from breast ca)  . Chronic kidney disease 01/2018   recent shut down of kidneys with labs out of whack  . Complication of anesthesia    pt states she had the shakes extremely bad  . Cough    at night d/t reflux;nothing productive  . Dental crowns present    x 4  . Depression   . GERD (gastroesophageal reflux disease)    takes Nexium daily  . History of bronchitis    08/2011  . History of colon polyps   . Hot flashes, menopausal 08/08/2012  . IBS (irritable bowel syndrome)    immodium prn  . Insomnia    takes Ambien nightly  . Irritable bowel syndrome   . 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 Neutropenia, drug-induced ([place]) 12/15/2011  . Nocturia   . Plantar fasciitis   . S/P radiation therapy 07/03/2012 through 08/18/2038                                                        Right chest wall/regional lymph nodes 5040 cGy 28 sessions, right chest wall/mastectomy scar boost 1000 cGy 5 sessions                          . Status  post chemotherapy    FEC 100 starting on 12/08/2011 - 01/18/12/single agent Taxol x12 weeks from 02/02/2012 through January 2014   . Urinary frequency   . Use of tamoxifen (Nolvadex)    now stopped. will be starting ibrance on 03/05/18  . Vertigo     Past Surgical History:  Procedure Laterality Date  . BREAST LUMPECTOMY  2008   left  . COLONOSCOPY    . COLONOSCOPY WITH PROPOFOL N/A 10/10/2017   Procedure: COLONOSCOPY WITH PROPOFOL;  Surgeon: Lucilla Lame, MD;  Location: [facility] ENDOSCOPY;  Service: Endoscopy;  Laterality: N/A;  . CYSTOSCOPY WITH STENT PLACEMENT Bilateral 12/17/2017   Procedure: CYSTOSCOPY WITH STENT PLACEMENT;  Surgeon: Hollice Espy, MD;  Location: [facility] ORS;  Service: Urology;  Laterality: Bilateral;  . IR NEPHROSTOMY EXCHANGE LEFT  02/13/2018  . IR NEPHROSTOMY EXCHANGE RIGHT  01/02/2018  . IR NEPHROSTOMY EXCHANGE RIGHT  02/13/2018  . IR NEPHROSTOMY PLACEMENT LEFT  12/19/2017  . IR NEPHROSTOMY PLACEMENT RIGHT  12/19/2017  . IR NEPHROSTOMY PLACEMENT RIGHT  04/06/2018  .  LUMBAR PUNCTURE  03/01/2018  . MASTECTOMY Bilateral 2013  . MASTECTOMY WITH AXILLARY LYMPH NODE DISSECTION Right 05/18/2012   Procedure: MASTECTOMY WITH AXILLARY LYMPH NODE DISSECTION;  Surgeon: Adin Hector, MD;  Location: [place];  Service: General;  Laterality: Right;  Bilateral Total Mastectomy , right axillary lymph node dissection left axillary sentinel node biopsy, removal of Porta Cath  . NEPHROSTOMY TUBE REMOVAL Bilateral 03/01/2018   removed tubes in dr. Erlene Quan office  . PORT-A-CATH REMOVAL N/A 05/18/2012   Procedure: REMOVAL PORT-A-CATH;  Surgeon: Adin Hector, MD;  Location: [name];  Service: General;  Laterality: N/A;  . PORTA CATH INSERTION N/A 12/26/2017   Procedure: PORTA CATH INSERTION;  Surgeon: Katha Cabal, MD;  Location: [place] [facility];  Service: Cardiovascular;  Laterality: N/A;  . PORTACATH PLACEMENT  12/02/2011   Procedure: INSERTION PORT-A-CATH;  Surgeon: Adin Hector, MD;  Location: [place];  Service: General;  Laterality: N/A;  insertion port a cath with fluoroscopy  . SIMPLE MASTECTOMY WITH AXILLARY SENTINEL NODE BIOPSY Left 05/18/2012   Procedure: SIMPLE MASTECTOMY WITH AXILLARY SENTINEL NODE BIOPSY;  Surgeon: Adin Hector, MD;  Location: [place];  Service: General;  Laterality: Left;    Family History  Problem Relation Age of Onset  . Hypertension Mother   . Diabetes Mother   . Hypertension Maternal Grandmother   . Pancreatic cancer Paternal Grandmother        diagnosed in her 94s  . Ovarian cancer Maternal Aunt        maternal grandmother's sister  . Breast cancer Paternal Aunt        diagnosed in late 64s early 73s  . Parkinsonism Paternal Grandfather     Social History:  reports that she quit smoking about 21 years ago. Her smoking use included cigarettes. She has a 7.50 pack-year smoking history. She has never used smokeless tobacco. She reports previous alcohol use. She reports that she does not use drugs.  Allergies  Allergen Reactions  . Adhesive [Tape] Other (See Comments)    Redness:Please use paper tape    Medications:  I have reviewed the patient's current medications. Prior to Admission:  Medications Prior to Admission  Medication Sig Dispense Refill Last Dose  . ALPRAZolam (XANAX) 0.25 MG tablet TAKE ONE TABLET BY MOUTH TWICE DAILY AS NEEDED FOR ANXIETY   prn at prn  . amLODipine (NORVASC) 5 MG tablet Take 1 tablet (5 mg total) by mouth daily. 90 tablet 1 04/18/2018 at 0800  . apixaban (ELIQUIS) 2.5 MG TABS tablet Take 1 tablet (2.5 mg total) by mouth 2 (two) times daily. 60 tablet 3 04/18/2018 at 0800  . Ascorbic Acid (VITAMIN C) 100 MG tablet Take 100 mg by mouth daily.     04/18/2018 at 0800  . Calcium Carbonate-Vitamin D (CALTRATE 600+D PO) Take 1 tablet by mouth daily.   04/18/2018 at 0800  . diazepam (VALIUM) 10 MG tablet Take 1 tablet (10 mg total) by mouth at bedtime as needed for anxiety. 30  tablet 5 prn at prn  . dronabinol (MARINOL) 5 MG capsule Take 1 capsule (5 mg total) by mouth 2 (two) times daily before lunch and supper. 60 capsule 0 04/18/2018 at 1200  . esomeprazole (NEXIUM) 40 MG capsule Take 40 mg by mouth daily as needed.    prn at prn  . ferrous sulfate 325 (65 FE) MG EC tablet Take 1 tablet (325 mg total) by mouth daily. 30 tablet 3 04/18/2018  at 0800  . fulvestrant (FASLODEX) 250 MG/5ML injection Inject 250 mg into the muscle every 14 (fourteen) days. One injection each buttock over 1-2 minutes. Warm prior to use.   Past Month at Unknown time  . furosemide (LASIX) 20 MG tablet Take 1 tablet (20 mg total) by mouth daily as needed. 30 tablet 1 prn at prn  . goserelin (ZOLADEX) 3.6 MG injection Inject 3.6 mg into the skin every 28 (twenty-eight) days.   Past Month at Unknown time  . lidocaine-prilocaine (EMLA) cream Apply to affected area once 30 g 3 Past Week at Unknown time  . loperamide (IMODIUM A-D) 2 MG tablet Take 2 mg by mouth 4 (four) times daily as needed for diarrhea or loose stools.    prn at prn  . Multiple Vitamin (MULTIVITAMIN) tablet Take 1 tablet by mouth daily.     04/18/2018 at 0800  . ondansetron (ZOFRAN) 4 MG tablet TAKE 1 TABLET BY MOUTH EVERY 8 HOURS AS NEEDED FOR NAUSEA OR VOMITING   prn at prn  . oxyCODONE (OXY IR/ROXICODONE) 5 MG immediate release tablet Take 1 tablet (5 mg total) by mouth every 6 (six) hours as needed for moderate pain or severe pain. 120 tablet 0 prn at prn  . oxyCODONE (OXYCONTIN) 10 mg 12 hr tablet Take 1 tablet (10 mg total) by mouth every 12 (twelve) hours. 60 tablet 0 04/17/2018 at 2100  . prochlorperazine (COMPAZINE) 10 MG tablet Take 1 tablet (10 mg total) by mouth every 6 (six) hours as needed (Nausea or vomiting). 30 tablet 1 prn at prn  . scopolamine (TRANSDERM-SCOP) 1 MG/3DAYS Place 1 patch (1.5 mg total) onto the skin every 3 (three) days. 10 patch 0 Past Week at Unknown time  . senna (SENOKOT) 8.6 MG TABS tablet Take 2  tablets (17.2 mg total) by mouth daily. 120 each 0 04/18/2018 at 0800   Scheduled: . amLODipine  5 mg Oral Daily  . apixaban  2.5 mg Oral BID  . dronabinol  5 mg Oral BID AC  . ferrous sulfate  325 mg Oral Daily  . multivitamin with minerals  1 tablet Oral Daily  . oxyCODONE  10 mg Oral Q12H  . pantoprazole  40 mg Oral Daily  . scopolamine  1 patch Transdermal Q72H  . senna  2 tablet Oral Daily  . vitamin C  250 mg Oral Daily    ROS: History obtained from the patient  General ROS: poor appetite, fatigue Psychological ROS: negative for - behavioral disorder, hallucinations, memory difficulties, mood swings or suicidal ideation Ophthalmic ROS: blind ENT ROS: negative for - epistaxis, nasal discharge, oral lesions, sore throat, tinnitus  Allergy and Immunology ROS: negative for - hives or itchy/watery eyes Hematological and Lymphatic ROS: negative for - bleeding problems, bruising or swollen lymph nodes Endocrine ROS: negative for - galactorrhea, hair pattern changes, polydipsia/polyuria or temperature intolerance Respiratory ROS: negative for - cough, hemoptysis, shortness of breath or wheezing Cardiovascular ROS: negative for - chest pain, dyspnea on exertion, edema or irregular heartbeat Gastrointestinal ROS: nausea/vomiting Genito-Urinary ROS: negative for - dysuria, hematuria, incontinence or urinary frequency/urgency Musculoskeletal ROS: weakness Neurological ROS: as noted in HPI Dermatological ROS: negative for rash and skin lesion changes  Physical Examination: Blood pressure 127/77, pulse 76, temperature 98.2 F (36.8 C), temperature source Oral, resp. rate 17, height 5\' 5"  (1.651 m), weight 66.2 kg, last menstrual period 11/28/2011, SpO2 93 %.  HEENT-  Normocephalic, no lesions, without obvious abnormality.  Normal external eye and  conjunctiva.  Normal TM's bilaterally.  Normal auditory canals and external ears. Normal external nose, mucus membranes and septum.  Normal  pharynx. Cardiovascular- S1, S2 normal, pulses palpable throughout   Lungs- chest clear, no wheezing, rales, normal symmetric air entry Abdomen- soft, non-tender; bowel sounds normal; no masses,  no organomegaly Extremities- no edema Lymph-no adenopathy palpable Musculoskeletal-no joint tenderness, deformity or swelling Skin-warm and dry, no hyperpigmentation, vitiligo, or suspicious lesions  Neurological Examination   Mental Status: Slightly lethargic.  Speech fluent.  Follows commands but reports feeling poorly.   Cranial Nerves: II: Pupils equal and 10mm III,IV, VI: ptosis not present, extra-ocular motions intact bilaterally V,VII: mild left facial droop, facial light touch sensation normal bilaterally VIII: hearing normal bilaterally IX,X: gag reflex present XI: bilateral shoulder shrug XII: midline tongue extension Motor: Generalized weakness throughout with no focal weakness noted Sensory: Pinprick and light touch intact throughout, bilaterally Deep Tendon Reflexes: 1+ in the upper extremities and absent in the lower extremities Plantars: Right: mute   Left: mute Cerebellar: Unable to perform due to weakness and feeling poorly Gait: not tested due to safety concerns    Laboratory Studies:   Basic Metabolic Panel: Recent Labs  Lab 04/17/18 0815 04/18/18 1503  NA 135 134*  K 3.7 3.6  CL 99 99  CO2 27 26  GLUCOSE 190* 138*  BUN 12 11  CREATININE 0.88 0.82  CALCIUM 8.9 8.3*    Liver Function Tests: Recent Labs  Lab 04/17/18 0815 04/18/18 1503  AST 50* 72*  ALT 35 40  ALKPHOS 184* 175*  BILITOT 0.3 0.3  PROT 6.4* 6.4*  ALBUMIN 2.8* 2.9*   No results for input(s): LIPASE, AMYLASE in the last 168 hours. Recent Labs  Lab 04/18/18 1630  AMMONIA 20    CBC: Recent Labs  Lab 04/17/18 0815 04/18/18 1503  WBC 4.2 5.4  NEUTROABS 3.0  --   HGB 11.4* 11.1*  HCT 35.0* 34.3*  MCV 90.9 92.0  PLT 360 385    Cardiac Enzymes: Recent Labs  Lab  04/18/18 1503  TROPONINI <0.03    BNP: Invalid input(s): POCBNP  CBG: No results for input(s): GLUCAP in the last 168 hours.  Microbiology: Results for orders placed or performed during the hospital encounter of 03/01/18  CSF culture     Status: None   Collection Time: 03/01/18 12:51 PM  Result Value Ref Range Status   Specimen Description   Final    CSF Performed at [facility], [address] [place], [place] [address]    Special Requests   Final    NONE Performed at [facility], [address] [place], [place] [address]    Gram Stain   Final    NO ORGANISMS SEEN WBC SEEN NO RBC SEEN Performed at [facility], [address], [place], [place] [address]    Culture   Final    NO GROWTH 3 DAYS Performed at [facility], [place] [address], [place], [place] [address]    Report Status 03/05/2018 FINAL  Final    Coagulation Studies: No results for input(s): LABPROT, INR in the last 72 hours.  Urinalysis:  Recent Labs  Lab 04/18/18 0445  COLORURINE AMBER*  LABSPEC 1.043*  PHURINE 5.0  GLUCOSEU NEGATIVE  HGBUR NEGATIVE  BILIRUBINUR NEGATIVE  KETONESUR 5*  PROTEINUR 30*  NITRITE NEGATIVE  LEUKOCYTESUR NEGATIVE    Lipid Panel:     Component Value Date/Time   CHOL 192 11/22/2017 1044  TRIG 138.0 11/22/2017 1044   HDL 47.80 11/22/2017 1044   CHOLHDL 4 11/22/2017 1044   VLDL 27.6 11/22/2017 1044   LDLCALC 117 (H) 11/22/2017 1044    HgbA1C:  Lab Results  Component Value Date   HGBA1C 5.9 11/22/2017    Urine Drug Screen:  No results found for: LABOPIA, COCAINSCRNUR, LABBENZ, AMPHETMU, THCU, LABBARB  Alcohol Level: No results for input(s): ETH in the last 168 hours.  Other results: EKG: sinus rhythm at 87 bpm.  Imaging: Ct Head Wo Contrast  Result Date: 04/18/2018 CLINICAL DATA:  Syncopal episode, seizures, VP shunt EXAM: CT HEAD WITHOUT CONTRAST TECHNIQUE: Contiguous axial images were obtained from the base of  the skull through the vertex without intravenous contrast. COMPARISON:  03/05/2018 MRI FINDINGS: Brain: Right frontal shunt tubing terminates in right lateral ventricle close to midline. There is diffuse enlargement of the ventricular system, difficult to exclude mild hydrocephalus or shunt malfunction. Ventricles remain enlarged and similar to the 03/05/2018 MR. Mild periventricular white matter microvascular ischemic changes. No acute intracranial hemorrhage, mass lesion, definite infarction, midline shift, herniation, extra-axial fluid collection. No focal mass effect or edema. Cisterns are patent. Cerebellar atrophy as well. Vascular: No hyperdense vessel or unexpected calcification. Skull: Normal. Negative for fracture or focal lesion. Sinuses/Orbits: No acute finding. Other: None. IMPRESSION: Interval right frontal shunt insertion. Similar diffuse ventricular enlargement of the entire ventricular system. Difficult to exclude hydrocephalus or shunt malfunction. Minor white matter microvascular changes Stable brain atrophy pattern. No other acute intracranial abnormality or hemorrhage. Electronically Signed   By: Jerilynn Mages.  Shick M.D.   On: 04/18/2018 20:20   Dg Abd 2 Views  Result Date: 04/18/2018 CLINICAL DATA:  Initial evaluation for constipation. EXAM: ABDOMEN - 2 VIEW COMPARISON:  None. FINDINGS: Paucity of gas limits evaluation of the bowels. No evidence for obstruction. No free air on upright view. No abnormal bowel wall thickening. Moderate retained stool within the colon, most notable at the splenic flexure. No soft tissue mass or abnormal calcification. Right-sided percutaneous nephrostomy tube in place. No acute osseous abnormality. Right basilar atelectasis noted. Right-sided Port-A-Cath noted as well. IMPRESSION: Nonobstructive bowel gas pattern with moderate retained stool within the colon. Electronically Signed   By: Jeannine Boga M.D.   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  Patient undergoing chemotherapy.  Scheduled to start intrathecal chemotherapy as well.  Head CT reviewed and shows evidence of hydrocephalus.  This was compared with radiology to her recent imaging at [facility].  Although there has been some increase in her hydrocephalus within the past couple of weeks her last two imagines show stable hydrocephalus.  EEG performed and shows no evidence of epileptiform discharges.  Although EEG dose not unequivocally rule out seizure would not rule out the possibility of her events being secondary to hydrocephalus.    Recommendations: 1. Therapeutic trial of anticonvulsants.  Keppra 500mg  BID.  May need to use suspension.  If no improvement in events would have patient re-evaluated by NSg to rule out the possibility of her hydrocephalus causing her events.     Alexis Goodell, MD Neurology [phone] 04/19/2018, 10:30 AM

## 2018-04-19 NOTE — Progress Notes (Signed)
eeg completed ° °

## 2018-04-19 NOTE — Progress Notes (Signed)
[facility] NAME: Mary [name]    MR#:  [id number]  DATE OF BIRTH:  11/04/1960  SUBJECTIVE:  CHIEF COMPLAINT:   Chief Complaint  Patient presents with  . Loss of Consciousness  Patient seen and evaluated today Has.'s of loss of consciousness Completed EEG today Awake and responds to all verbal commands   REVIEW OF SYSTEMS:    ROS  CONSTITUTIONAL: No documented fever. has fatigue, weakness. No weight gain, no weight loss.  EYES: No blurry or double vision.  ENT: No tinnitus. No postnasal drip. No redness of the oropharynx.  RESPIRATORY: No cough, no wheeze, no hemoptysis. No dyspnea.  CARDIOVASCULAR: No chest pain. No orthopnea. No palpitations. No syncope.  GASTROINTESTINAL: No nausea, no vomiting or diarrhea. No abdominal pain. No melena or hematochezia.  GENITOURINARY: No dysuria or hematuria.  ENDOCRINE: No polyuria or nocturia. No heat or cold intolerance.  HEMATOLOGY: No anemia. No bruising. No bleeding.  INTEGUMENTARY: No rashes. No lesions.  MUSCULOSKELETAL: No arthritis. No swelling. No gout.  NEUROLOGIC: No numbness, tingling, or ataxia. No seizure-type activity.  Intermittent episodes of loss of consciousness PSYCHIATRIC: No anxiety. No insomnia. No ADD.   DRUG ALLERGIES:   Allergies  Allergen Reactions  . Adhesive [Tape] Other (See Comments)    Redness:Please use paper tape    VITALS:  Blood pressure 127/77, pulse 76, temperature 98.2 F (36.8 C), temperature source Oral, resp. rate 17, height 5\' 5"  (1.651 m), weight 66.2 kg, last menstrual period 11/28/2011, SpO2 93 %.  PHYSICAL EXAMINATION:   Physical Exam  GENERAL:  58 y.o.-year-old patient lying in the bed with no acute distress.  EYES: Pupils equal, round, reactive to light and accommodation. No scleral icterus. Extraocular muscles intact.  HEENT: Head atraumatic, normocephalic. Oropharynx and nasopharynx clear.  NECK:  Supple, no jugular  venous distention. No thyroid enlargement, no tenderness.  LUNGS: Normal breath sounds bilaterally, no wheezing, rales, rhonchi. No use of accessory muscles of respiration.  CARDIOVASCULAR: S1, S2 normal. No murmurs, rubs, or gallops.  ABDOMEN: Soft, nontender, nondistended. Bowel sounds present. No organomegaly or mass.  EXTREMITIES: No cyanosis, clubbing or edema b/l.    NEUROLOGIC: Cranial nerves II through XII are intact. No focal Motor or sensory deficits b/l.   PSYCHIATRIC: The patient is alert and oriented x 3.  SKIN: No obvious rash, lesion, or ulcer.   LABORATORY PANEL:   CBC Recent Labs  Lab 04/18/18 1503  WBC 5.4  HGB 11.1*  HCT 34.3*  PLT 385   ------------------------------------------------------------------------------------------------------------------ Chemistries  Recent Labs  Lab 04/18/18 1503  NA 134*  K 3.6  CL 99  CO2 26  GLUCOSE 138*  BUN 11  CREATININE 0.82  CALCIUM 8.3*  AST 72*  ALT 40  ALKPHOS 175*  BILITOT 0.3   ------------------------------------------------------------------------------------------------------------------  Cardiac Enzymes Recent Labs  Lab 04/18/18 1503  TROPONINI <0.03   ------------------------------------------------------------------------------------------------------------------  RADIOLOGY:  Ct Head Wo Contrast  Result Date: 04/18/2018 CLINICAL DATA:  Syncopal episode, seizures, VP shunt EXAM: CT HEAD WITHOUT CONTRAST TECHNIQUE: Contiguous axial images were obtained from the base of the skull through the vertex without intravenous contrast. COMPARISON:  03/05/2018 MRI FINDINGS: Brain: Right frontal shunt tubing terminates in right lateral ventricle close to midline. There is diffuse enlargement of the ventricular system, difficult to exclude mild hydrocephalus or shunt malfunction. Ventricles remain enlarged and similar to the 03/05/2018 MR. Mild periventricular white matter microvascular ischemic changes. No  acute intracranial hemorrhage, mass lesion, definite  infarction, midline shift, herniation, extra-axial fluid collection. No focal mass effect or edema. Cisterns are patent. Cerebellar atrophy as well. Vascular: No hyperdense vessel or unexpected calcification. Skull: Normal. Negative for fracture or focal lesion. Sinuses/Orbits: No acute finding. Other: None. IMPRESSION: Interval right frontal shunt insertion. Similar diffuse ventricular enlargement of the entire ventricular system. Difficult to exclude hydrocephalus or shunt malfunction. Minor white matter microvascular changes Stable brain atrophy pattern. No other acute intracranial abnormality or hemorrhage. Electronically Signed   By: Jerilynn Mages.  Shick M.D.   On: 04/18/2018 20:20   US Renal  Result Date: 04/19/2018 CLINICAL DATA:  Nephrostomy status. EXAM: RENAL / URINARY TRACT ULTRASOUND COMPLETE COMPARISON:  None. FINDINGS: Right Kidney: Renal measurements: 11.6 x 5 x 4.3 cm = volume: 129 mL . Echogenicity within normal limits. Mild hydronephrosis is noted. Nephrostomy tube is present. Left Kidney: Renal measurements: 11.3 x 5.3 x 3.9 cm = volume: 121 mL. Echogenicity within normal limits. Moderate hydronephrosis is noted Bladder: Appears normal for degree of bladder distention. No ureteral jets are noted. Ascites are noted in the abdomen pelvis. Bilateral pleural effusions are noted. IMPRESSION: Right nephrostomy tube is identified. There is mild right hydronephrosis. Moderate left hydronephrosis is noted. Ascites identified in the abdomen and pelvis. Bilateral pleural effusions. Electronically Signed   By: Abelardo Diesel M.D.   On: 04/19/2018 11:35   Dg Abd 2 Views  Result Date: 04/18/2018 CLINICAL DATA:  Initial evaluation for constipation. EXAM: ABDOMEN - 2 VIEW COMPARISON:  None. FINDINGS: Paucity of gas limits evaluation of the bowels. No evidence for obstruction. No free air on upright view. No abnormal bowel wall thickening. Moderate retained stool  within the colon, most notable at the splenic flexure. No soft tissue mass or abnormal calcification. Right-sided percutaneous nephrostomy tube in place. No acute osseous abnormality. Right basilar atelectasis noted. Right-sided Port-A-Cath noted as well. IMPRESSION: Nonobstructive bowel gas pattern with moderate retained stool within the colon. Electronically Signed   By: Jeannine Boga M.D.   On: 04/18/2018 17:44     ASSESSMENT AND PLAN:   58 year old female patient with history of metastatic breast cancer with meningeal mets currently had a recent placement of [place] reservoir by neurosurgery.  Due for next chemotherapy on Friday through the reservoir  -Intermittent loss of consciousness Unknown etiology Follow-up EEG report Neurology follow-up CT head reviewed  -Chronic anxiety disorder Continue anxiolytics  -History of nephrostomy tube secondary to right-sided hydronephrosis Follow-up renal ultrasound  -Metastatic breast cancer Oncology follow-up as outpatient  -Chronic pain Pain management with oral as well as as needed Dilaudid IV  -DVT prophylaxis Continue oral Eliquis  All the records are reviewed and case discussed with Care Management/Social Worker. Management plans discussed with the patient, family and they are in agreement.  CODE STATUS: Full code  DVT Prophylaxis: SCDs  TOTAL TIME TAKING CARE OF THIS PATIENT: 52 minutes.   POSSIBLE D/C IN 2 to 3 DAYS, DEPENDING ON CLINICAL CONDITION.  Saundra Shelling M.D on 04/19/2018 at 2:14 PM  Between 7am to 6pm - Pager - [phone]  After 6pm go to [email or web address] - password [email or web address] [facility]  Office  [phone]  CC: Primary care physician; [name], MD  Note: This dictation was prepared with Dragon dictation along with smaller phrase technology. Any transcriptional errors that result from this process are unintentional.

## 2018-04-19 NOTE — Procedures (Signed)
ELECTROENCEPHALOGRAM REPORT   Patient: Mary [name]       Room #: 122A-AA EEG No. ID: [id number] Age: 58 y.o.        Sex: female Referring Physician: Pyreddy Report Date:  04/19/2018        Interpreting Physician: Alexis Goodell  History: [name] is an 58 y.o. female with frequent episodes of unresponsiveness  Medications:  Norvasc, Eliquis, Marinol, Ferrous sulfate, Oxycontin, Protonix, Vitamin C  Conditions of Recording:  This is a 21 channel routine scalp EEG performed with bipolar and monopolar montages arranged in accordance to the international 10/20 system of electrode placement. One channel was dedicated to EKG recording.  The patient is in the awake, drowsy and asleep states.  Description:  There is lead artifact that is prominent during the recording.  When able to be visualized the waking background activity consists of a low voltage, symmetrical, fairly well organized, 8 Hz alpha activity, seen from the parieto-occipital and posterior temporal regions.  Low voltage fast activity, poorly organized, is seen anteriorly and is at times superimposed on more posterior regions.  A mixture of theta and alpha rhythms are seen from the central and temporal regions. The patient drowses briefly with slowing to irregular, low voltage theta and beta activity.   The patient goes in to a light sleep with symmetrical sleep spindles, vertex central sharp transients and irregular slow activity. Hyperventilation was not performed.  Intermittent photic stimulation was performed but failed to illicit any change in the tracing.   IMPRESSION: Normal electroencephalogram, awake, asleep and with activation procedures.  Alexis Goodell, MD Neurology [phone] 04/19/2018, 2:36 PM

## 2018-04-19 NOTE — Plan of Care (Signed)

## 2018-04-20 ENCOUNTER — Inpatient Hospital Stay: Payer: 59

## 2018-04-20 ENCOUNTER — Inpatient Hospital Stay: Payer: 59 | Admitting: Hospice and Palliative Medicine

## 2018-04-20 DIAGNOSIS — C50919 Malignant neoplasm of unspecified site of unspecified female breast: Secondary | ICD-10-CM

## 2018-04-20 DIAGNOSIS — K5903 Drug induced constipation: Secondary | ICD-10-CM

## 2018-04-20 DIAGNOSIS — R55 Syncope and collapse: Secondary | ICD-10-CM

## 2018-04-20 DIAGNOSIS — C7949 Secondary malignant neoplasm of other parts of nervous system: Principal | ICD-10-CM

## 2018-04-20 DIAGNOSIS — R180 Malignant ascites: Secondary | ICD-10-CM

## 2018-04-20 DIAGNOSIS — C762 Malignant neoplasm of abdomen: Secondary | ICD-10-CM

## 2018-04-20 DIAGNOSIS — Z936 Other artificial openings of urinary tract status: Secondary | ICD-10-CM

## 2018-04-20 DIAGNOSIS — G893 Neoplasm related pain (acute) (chronic): Secondary | ICD-10-CM

## 2018-04-20 LAB — BASIC METABOLIC PANEL
Anion gap: 7 (ref 5–15)
BUN: 17 mg/dL (ref 6–20)
CO2: 31 mmol/L (ref 22–32)
Calcium: 8.4 mg/dL — ABNORMAL LOW (ref 8.9–10.3)
Chloride: 96 mmol/L — ABNORMAL LOW (ref 98–111)
Creatinine, Ser: 0.71 mg/dL (ref 0.44–1.00)
GFR calc Af Amer: >60 mL/min (ref >60)
GFR calc non Af Amer: >60 mL/min (ref >60)
Glucose, Bld: 169 mg/dL — ABNORMAL HIGH (ref 70–99)
Potassium: 4.7 mmol/L (ref 3.5–5.1)
Sodium: 134 mmol/L — ABNORMAL LOW (ref 135–145)

## 2018-04-20 MED ORDER — FLEET ENEMA 7-19 GM/118ML RE ENEM
1.0000 | ENEMA | Freq: Every day | RECTAL | Status: DC | PRN
  Administered 2018-04-20: 12:00:00 1 via RECTAL
  Filled 2018-04-20: qty 1

## 2018-04-20 MED ORDER — DIPHENHYDRAMINE HCL 25 MG PO CAPS
25.0000 mg | ORAL_CAPSULE | Freq: Four times a day (QID) | ORAL | Status: DC | PRN
  Administered 2018-04-20 – 2018-04-22 (×2): 25 mg via ORAL
  Filled 2018-04-20 (×2): qty 1

## 2018-04-20 MED ORDER — NALOXEGOL OXALATE 25 MG PO TABS
25.0000 mg | ORAL_TABLET | Freq: Every day | ORAL | Status: DC
  Administered 2018-04-20 – 2018-04-26 (×6): 25 mg via ORAL
  Filled 2018-04-20 (×8): qty 1

## 2018-04-20 MED ORDER — ALBUMIN HUMAN 25 % IV SOLN
25.0000 g | Freq: Once | INTRAVENOUS | Status: DC
  Filled 2018-04-20: qty 100

## 2018-04-20 NOTE — Progress Notes (Signed)
Subjective: Patient awake and alert today.  Had no episodes of altered consciousness overnight.  Started on Keppra.  No complaints of side effects.    Objective: Current vital signs: BP (!) 152/90 (BP Location: Left Arm)   Pulse 97   Temp 98.3 F (36.8 C)   Resp 15   Ht 5\' 5"  (1.651 m)   Wt 66.2 kg   LMP 11/28/2011   SpO2 93%   BMI 24.30 kg/m  Vital signs in last 24 hours: Temp:  [98.3 F (36.8 C)-99.1 F (37.3 C)] 98.3 F (36.8 C) (01/31 0416) Pulse Rate:  [79-103] 97 (01/31 0416) Resp:  [15-18] 15 (01/31 0416) BP: (136-152)/(71-90) 152/90 (01/31 0416) SpO2:  [91 %-93 %] 93 % (01/31 0416)  Intake/Output from previous day: 01/30 0701 - 01/31 0700 In: 50 [P.O.:50] Out: 2230 [Urine:2230] Intake/Output this shift: No intake/output data recorded. Nutritional status:  Diet Order            DIET SOFT Room service appropriate? Yes; Fluid consistency: Thin  Diet effective now              Neurologic Exam: Mental Status: Awake and alert.  Speech fluent.  Follows commands but reports feeling poorly.   Cranial Nerves: II: Pupils equal and 70mm III,IV, VI: ptosis not present, extra-ocular motions intact bilaterally V,VII: mild left facial droop, facial light touch sensation normal bilaterally VIII: hearing normal bilaterally IX,X: gag reflex present XI: bilateral shoulder shrug XII: midline tongue extension Motor: Generalized weakness throughout with no focal weakness noted  Lab Results: Basic Metabolic Panel: Recent Labs  Lab 04/17/18 0815 04/18/18 1503 04/20/18 0518  NA 135 134* 134*  K 3.7 3.6 4.7  CL 99 99 96*  CO2 27 26 31   GLUCOSE 190* 138* 169*  BUN 12 11 17   CREATININE 0.88 0.82 0.71  CALCIUM 8.9 8.3* 8.4*    Liver Function Tests: Recent Labs  Lab 04/17/18 0815 04/18/18 1503  AST 50* 72*  ALT 35 40  ALKPHOS 184* 175*  BILITOT 0.3 0.3  PROT 6.4* 6.4*  ALBUMIN 2.8* 2.9*   No results for input(s): LIPASE, AMYLASE in the last 168  hours. Recent Labs  Lab 04/18/18 1630  AMMONIA 20    CBC: Recent Labs  Lab 04/17/18 0815 04/18/18 1503  WBC 4.2 5.4  NEUTROABS 3.0  --   HGB 11.4* 11.1*  HCT 35.0* 34.3*  MCV 90.9 92.0  PLT 360 385    Cardiac Enzymes: Recent Labs  Lab 04/18/18 1503  TROPONINI <0.03    Lipid Panel: No results for input(s): CHOL, TRIG, HDL, CHOLHDL, VLDL, LDLCALC in the last 168 hours.  CBG: No results for input(s): GLUCAP in the last 168 hours.  Microbiology: Results for orders placed or performed during the hospital encounter of 03/01/18  CSF culture     Status: None   Collection Time: 03/01/18 12:51 PM  Result Value Ref Range Status   Specimen Description   Final    CSF Performed at [facility], [address] [place], [place] [address]    Special Requests   Final    NONE Performed at [facility], [address] [place], [place] [address]    Gram Stain   Final    NO ORGANISMS SEEN WBC SEEN NO RBC SEEN Performed at [facility], [address], [place], [place] [address]    Culture   Final    NO GROWTH 3 DAYS Performed at [facility], [place]  [address] [place], [place] [address]    Report Status 03/05/2018 FINAL  Final    Coagulation Studies: No results for input(s): LABPROT, INR in the last 72 hours.  Imaging: Ct Head Wo Contrast  Result Date: 04/18/2018 CLINICAL DATA:  Syncopal episode, seizures, VP shunt EXAM: CT HEAD WITHOUT CONTRAST TECHNIQUE: Contiguous axial images were obtained from the base of the skull through the vertex without intravenous contrast. COMPARISON:  03/05/2018 MRI FINDINGS: Brain: Right frontal shunt tubing terminates in right lateral ventricle close to midline. There is diffuse enlargement of the ventricular system, difficult to exclude mild hydrocephalus or shunt malfunction. Ventricles remain enlarged and similar to the 03/05/2018 MR. Mild periventricular white matter microvascular ischemic changes. No  acute intracranial hemorrhage, mass lesion, definite infarction, midline shift, herniation, extra-axial fluid collection. No focal mass effect or edema. Cisterns are patent. Cerebellar atrophy as well. Vascular: No hyperdense vessel or unexpected calcification. Skull: Normal. Negative for fracture or focal lesion. Sinuses/Orbits: No acute finding. Other: None. IMPRESSION: Interval right frontal shunt insertion. Similar diffuse ventricular enlargement of the entire ventricular system. Difficult to exclude hydrocephalus or shunt malfunction. Minor white matter microvascular changes Stable brain atrophy pattern. No other acute intracranial abnormality or hemorrhage. Electronically Signed   By: Jerilynn Mages.  Shick M.D.   On: 04/18/2018 20:20   US Renal  Result Date: 04/19/2018 CLINICAL DATA:  Nephrostomy status. EXAM: RENAL / URINARY TRACT ULTRASOUND COMPLETE COMPARISON:  None. FINDINGS: Right Kidney: Renal measurements: 11.6 x 5 x 4.3 cm = volume: 129 mL . Echogenicity within normal limits. Mild hydronephrosis is noted. Nephrostomy tube is present. Left Kidney: Renal measurements: 11.3 x 5.3 x 3.9 cm = volume: 121 mL. Echogenicity within normal limits. Moderate hydronephrosis is noted Bladder: Appears normal for degree of bladder distention. No ureteral jets are noted. Ascites are noted in the abdomen pelvis. Bilateral pleural effusions are noted. IMPRESSION: Right nephrostomy tube is identified. There is mild right hydronephrosis. Moderate left hydronephrosis is noted. Ascites identified in the abdomen and pelvis. Bilateral pleural effusions. Electronically Signed   By: Abelardo Diesel M.D.   On: 04/19/2018 11:35   Dg Abd 2 Views  Result Date: 04/18/2018 CLINICAL DATA:  Initial evaluation for constipation. EXAM: ABDOMEN - 2 VIEW COMPARISON:  None. FINDINGS: Paucity of gas limits evaluation of the bowels. No evidence for obstruction. No free air on upright view. No abnormal bowel wall thickening. Moderate retained stool  within the colon, most notable at the splenic flexure. No soft tissue mass or abnormal calcification. Right-sided percutaneous nephrostomy tube in place. No acute osseous abnormality. Right basilar atelectasis noted. Right-sided Port-A-Cath noted as well. IMPRESSION: Nonobstructive bowel gas pattern with moderate retained stool within the colon. Electronically Signed   By: Jeannine Boga M.D.   On: 04/18/2018 17:44    Medications:  I have reviewed the patient's current medications. Scheduled: . amLODipine  5 mg Oral Daily  . apixaban  2.5 mg Oral BID  . dexamethasone  4 mg Oral Q12H  . dronabinol  5 mg Oral BID AC  . ferrous sulfate  325 mg Oral Daily  . furosemide  40 mg Intravenous Daily  . levETIRAcetam  500 mg Oral BID  . multivitamin with minerals  1 tablet Oral Daily  . naloxegol oxalate  25 mg Oral Daily  . oxyCODONE  10 mg Oral Q12H  . pantoprazole  40 mg Oral Daily  . potassium chloride  40 mEq Oral Daily  . scopolamine  1 patch Transdermal Q72H  . vitamin C  250 mg Oral Daily    Assessment/Plan: Patient awake and alert.  No episodes of loss of consciousness overnight.  EEG performed and shows no epileptiform discharges.  Keppra started as a diagnostic trial.  Patient tolerating well.    Recommendations: 1. Continue Keppra at 500mg  BID 2. Patient to follow up with neurosurgery after discharge  No further neurologic intervention is recommended at this time.  If further questions arise, please call or page at that time.  Thank you for allowing neurology to participate in the care of this patient.   LOS: 2 days   Alexis Goodell, MD Neurology [phone] 04/20/2018  9:35 AM

## 2018-04-20 NOTE — Consult Note (Signed)
Hematology/Oncology Consult note [facility] [phone] [phone]  Patient Care Team: [name], MD as PCP - General (Internal Medicine) Barrington Ellison, RN as [place] Management   Name of the patient: Mary [name]  [id number]  Mar 16, 1961   Date of visit: 04/20/18 REASON FOR COSULTATION:    History of presenting illness-  58 y.o. female with PMH listed at below who presents to ER due to episodes of blanking out spells with intermittent loss of consciousness.  CT head was done in emergency room was essentially stable from previous CT scan. No witness convulsive activities.  Patient recently had Ommaya reservoir placed by [facility] neurosurgery and was planned to start intrathecal chemotherapy today.  Oncology history dated back to August 2013.  She received neoadjuvant chemotherapy with FEC x4 followed by weekly Taxol x2 underwent bilateral mastectomy in February 2014, right ala MD and the left sentinel lymph node biopsy.  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  Left breast showed DCIS sentinel lymph node biopsy was negative.  Tis N0.  Patient received adjuvant radiation and has been on antiestrogen treatment with Zoladex plus Aromasin since then.  Patient Was found to have widespread metastatic breast cancer in September 2019 when she presented to emergency room due to abnormal lab values, including high calcium, acute renal failure and CT renal stone study showed bilateral hydronephrosis, stricture at the level of UPJ's.  Masslike thickening of the anterior and lateral bladder wall.  Mesenteric, omental, peritoneal implant with a small ascites and diffuse mesenteric edema.  Also bilateral pleural effusion with associated compressive atelectasis of the lung.  Diffuse small osseous skeletal.  Patient had bilateral nephrostomy tube placed after failing ureter stent placement.  Bladder mass was  biopsied which confirmed the diagnosis of metastatic breast cancer.  Patient underwent weekly Taxol treatment during her hospitalization as first-line due to visceral crisis.  After 2 cycles of weekly Taxol, she achieved partial response and kidney function improved and bilateral nephrostomy tube were removed.  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  At that point she was switched to Zoladex plus fulvestrant, also started on CDK 4/ 6 inhibitor.  Initially Ibrance and the patient developed nausea and vomiting around the same time of starting Ibrance and she only took 2 weeks of Ibrance before discontinuation. She was switched to abemaciclib around 03/26/2018.  #Neurologic symptoms.Patient has chronic dizziness and vertigo symptoms prior to the diagnosis of disease recurrence.  Her symptoms got better while on chemotherapy treatment and scopolamine patch.    Her initial MRI brain was negative.  Original plan was to finish 2 cycles of Taxol and proceed with lumbar puncture to see if any CNS involvement as lobular breast cancer tend to have leptomeningeal infiltration.   At the end of cycle 2 weekly Taxol, she started to have more blurry vision.. Repeat MRI brain on 03/05/2018 showed no brain parenchymal metastasis.  Had lumbar puncture done on 03/01/2018 and a CSF was positive for malignancy. Patient was sent to [facility] neurosurgery to establish care and for placement of Ommaya reservoir to start intrathecal chemotherapy.  There has been delay of establish care with [facility] neurosurgery due to holiday time and also patient missed one appointment due to patient's husband was admitted to hospital  #Patient was noticed to have slightly elevated creatinine of 1.14.  An ultrasound renal was obtained which showed bilateral hydronephrosis, right worse than left.  After discussion with Dr. Erlene Quan, we referred patient  to interventional radiologist for placement of right nephrostomy tube.  Follow-up CT chest  abdomen pelvis showed unfortunately interval development of moderate to large volume ascites, nodularity of the gastrocolic ligament and omentum, indicating disease progression..  Patient was taken off antiestrogen treatment and abemaciclib and resumed on weekly Taxol treatment.  Last chemotherapy was given on 04/17/2018.  She also had a paracentesis approximately 2 weeks ago and removed 4 L of ascites.  Ascites positive for malignant cells.  She is admitted currently due to blanking out spells.  Patient was seen by neurologist, recommend Keppra 500 mg twice daily.  Per neurology, although there has been some increase in her hydrocephalus within the past couple of weeks, her last 2 images showed a stable hydrocephalus.  Neurology recommends if no improvement in events, would have patient to be evaluated by neurosurgery to rule out possibility of her hydrocephalus causing the events.  Patient was seen at the bedside.  Husband, her parents and multiple family members are at bedside.  Patient just came back from therapeutic paracentesis which removed 3.2 L of ascites.  She feels weak and fatigued.  Felt nauseated no vomiting.  Denies any fever, chills, abdominal pain.  Review of Systems  Constitutional: Positive for appetite change, fatigue and unexpected weight change. Negative for chills and fever.  HENT:   Negative for hearing loss and voice change.   Eyes: Negative for eye problems.  Respiratory: Negative for chest tightness and cough.   Cardiovascular: Negative for chest pain.  Gastrointestinal: Positive for abdominal distention and nausea. Negative for abdominal pain and blood in stool.  Endocrine: Negative for hot flashes.  Genitourinary: Negative for difficulty urinating and frequency.   Musculoskeletal: Negative for arthralgias.  Skin: Negative for itching and rash.  Neurological: Negative for extremity weakness.  Hematological: Negative for adenopathy.  Psychiatric/Behavioral: Negative for  confusion.    Allergies  Allergen Reactions  . Adhesive [Tape] Other (See Comments)    Redness:Please use paper tape  . Albumin (Human) Swelling and Rash    Patient Active Problem List   Diagnosis Date Noted  . Constipation   . Palliative care encounter   . Neoplasm related pain   . Altered mental status 04/18/2018  . 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 Weakness generalized 04/06/2018  . 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 Goals of care, counseling/discussion 01/14/2018  . Edema of left lower extremity   . Metastatic breast cancer ([place])   . Hypercalcemia 12/16/2017  . Malignancy ([place])   . Acute deep vein thrombosis (DVT) of distal vein of left lower extremity ([place])   . Dizziness   . Bilateral posterior neck pain 11/24/2017  . Elevated liver enzymes 11/24/2017  . Tubular adenoma of colon 10/11/2017  . 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 Generalized anxiety disorder 09/19/2017  . Vertigo, labyrinthine, bilateral 08/27/2017  . Prediabetes 08/27/2017  . Encounter for screening colonoscopy 09/21/2014  . Other malaise and fatigue 12/14/2013  . Hot flashes, menopausal 08/08/2012  . Neutropenia, drug-induced ([place]) 12/15/2011  . Primary cancer of upper outer quadrant of left female breast ([place]) 11/17/2011  . Hypertension 09/23/2011  . PLANTAR FASCIITIS, LEFT 12/01/2009  . UNEQUAL LEG LENGTH 12/01/2009     Past Medical History:  Diagnosis Date  . Anxiety    takes Xanax prn  . Breast cancer [facility]) August 2013   bilateral, er/pr+, bladder (02/2018..mets from breast ca)  . Chronic kidney disease 01/2018   recent shut down of kidneys with labs out of whack  .  Complication of anesthesia    pt states she had the shakes extremely bad  . Cough    at night d/t reflux;nothing productive  . Dental crowns present    x 4  . Depression   . GERD (gastroesophageal reflux disease)    takes Nexium daily  . History of bronchitis    08/2011  . History of colon  polyps   . Hot flashes, menopausal 08/08/2012  . IBS (irritable bowel syndrome)    immodium prn  . Insomnia    takes Ambien nightly  . Irritable bowel syndrome   . 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 Neutropenia, drug-induced ([place]) 12/15/2011  . Nocturia   . Plantar fasciitis   . S/P radiation therapy 07/03/2012 through 08/18/2038                                                        Right chest wall/regional lymph nodes 5040 cGy 28 sessions, right chest wall/mastectomy scar boost 1000 cGy 5 sessions                          . Status post chemotherapy    FEC 100 starting on 12/08/2011 - 01/18/12/single agent Taxol x12 weeks from 02/02/2012 through January 2014   . Urinary frequency   . Use of tamoxifen (Nolvadex)    now stopped. will be starting ibrance on 03/05/18  . Vertigo      Past Surgical History:  Procedure Laterality Date  . BREAST LUMPECTOMY  2008   left  . COLONOSCOPY    . COLONOSCOPY WITH PROPOFOL N/A 10/10/2017   Procedure: COLONOSCOPY WITH PROPOFOL;  Surgeon: Lucilla Lame, MD;  Location: [facility] ENDOSCOPY;  Service: Endoscopy;  Laterality: N/A;  . CYSTOSCOPY WITH STENT PLACEMENT Bilateral 12/17/2017   Procedure: CYSTOSCOPY WITH STENT PLACEMENT;  Surgeon: Hollice Espy, MD;  Location: [facility] ORS;  Service: Urology;  Laterality: Bilateral;  . IR NEPHROSTOMY EXCHANGE LEFT  02/13/2018  . IR NEPHROSTOMY EXCHANGE RIGHT  01/02/2018  . IR NEPHROSTOMY EXCHANGE RIGHT  02/13/2018  . IR NEPHROSTOMY PLACEMENT LEFT  12/19/2017  . IR NEPHROSTOMY PLACEMENT RIGHT  12/19/2017  . IR NEPHROSTOMY PLACEMENT RIGHT  04/06/2018  . LUMBAR PUNCTURE  03/01/2018  . MASTECTOMY Bilateral 2013  . MASTECTOMY WITH AXILLARY LYMPH NODE DISSECTION Right 05/18/2012   Procedure: MASTECTOMY WITH AXILLARY LYMPH NODE DISSECTION;  Surgeon: Adin Hector, MD;  Location: [employer];  Service: General;  Laterality: Right;  Bilateral Total Mastectomy , right axillary lymph node dissection left axillary sentinel  node biopsy, removal of Porta Cath  . NEPHROSTOMY TUBE REMOVAL Bilateral 03/01/2018   removed tubes in dr. Erlene Quan office  . PORT-A-CATH REMOVAL N/A 05/18/2012   Procedure: REMOVAL PORT-A-CATH;  Surgeon: Adin Hector, MD;  Location: [place];  Service: General;  Laterality: N/A;  . PORTA CATH INSERTION N/A 12/26/2017   Procedure: PORTA CATH INSERTION;  Surgeon: Katha Cabal, MD;  Location: [place] [facility];  Service: Cardiovascular;  Laterality: N/A;  . PORTACATH PLACEMENT  12/02/2011   Procedure: INSERTION PORT-A-CATH;  Surgeon: Adin Hector, MD;  Location: [place];  Service: General;  Laterality: N/A;  insertion port a cath with fluoroscopy  . SIMPLE MASTECTOMY WITH AXILLARY SENTINEL NODE BIOPSY Left 05/18/2012   Procedure: SIMPLE MASTECTOMY WITH  AXILLARY SENTINEL NODE BIOPSY;  Surgeon: Adin Hector, MD;  Location: [place];  Service: General;  Laterality: Left;    Social History   Socioeconomic History  . Marital status: Married    Spouse name: Jenny Reichmann  . Number of children: 0  . Years of education: Not on file  . Highest education level: Not on file  Occupational History  . Occupation: Network engineer II    Employer: [employer]    Comment: on fmla d/t seizure in 10/19  Social Needs  . Financial resource strain: Not on file  . Food insecurity:    Worry: Not on file    Inability: Not on file  . Transportation needs:    Medical: Not on file    Non-medical: Not on file  Tobacco Use  . Smoking status: Former Smoker    Packs/day: 0.50    Years: 15.00    Pack years: 7.50    Types: Cigarettes    Last attempt to quit: 03/21/1997    Years since quitting: 21.0  . Smokeless tobacco: Never Used  Substance and Sexual Activity  . Alcohol use: Not Currently    Alcohol/week: 0.0 standard drinks    Comment: occasionally wine  . Drug use: No  . Sexual activity: Yes    Birth control/protection: None  Lifestyle  . Physical activity:    Days per week: Not on file      Minutes per session: Not on file  . Stress: Not on file  Relationships  . Social connections:    Talks on phone: Not on file    Gets together: Not on file    Attends religious service: Not on file    Active member of club or organization: Not on file    Attends meetings of clubs or organizations: Not on file    Relationship status: Not on file  . Intimate partner violence:    Fear of current or ex partner: Not on file    Emotionally abused: Not on file    Physically abused: Not on file    Forced sexual activity: Not on file  Other Topics Concern  . Not on file  Social History Narrative   Married to [employer]  No children 6 years old with fmp.  20 year use of birth control pills.       Family History  Problem Relation Age of Onset  . Hypertension Mother   . Diabetes Mother   . Hypertension Maternal Grandmother   . Pancreatic cancer Paternal Grandmother        diagnosed in her 58s  . Ovarian cancer Maternal Aunt        maternal grandmother's sister  . Breast cancer Paternal Aunt        diagnosed in late 40s early 32s  . Parkinsonism Paternal Grandfather      Current Facility-Administered Medications:  .  acetaminophen (TYLENOL) tablet 650 mg, 650 mg, Oral, Q6H PRN **OR** acetaminophen (TYLENOL) suppository 650 mg, 650 mg, Rectal, Q6H PRN, Fritzi Mandes, MD .  ALPRAZolam Duanne Moron) tablet 0.25 mg, 0.25 mg, Oral, BID PRN, Fritzi Mandes, MD, 0.25 mg at 04/19/18 2257 .  amLODipine (NORVASC) tablet 5 mg, 5 mg, Oral, Daily, Fritzi Mandes, MD, 5 mg at 04/20/18 0916 .  apixaban (ELIQUIS) tablet 2.5 mg, 2.5 mg, Oral, BID, Fritzi Mandes, MD, 2.5 mg at 04/20/18 0916 .  dexamethasone (DECADRON) tablet 4 mg, 4 mg, Oral, Q12H, Borders, Kirt Boys, NP, 4 mg at 04/20/18 0916 .  diazepam (  VALIUM) tablet 10 mg, 10 mg, Oral, QHS PRN, Fritzi Mandes, MD .  dronabinol (MARINOL) capsule 5 mg, 5 mg, Oral, BID AC, Fritzi Mandes, MD, 5 mg at 04/20/18 1130 .  ferrous sulfate tablet 325 mg, 325 mg, Oral,  Daily, Fritzi Mandes, MD, 325 mg at 04/20/18 0916 .  furosemide (LASIX) injection 40 mg, 40 mg, Intravenous, Daily, Pyreddy, Pavan, MD, 40 mg at 04/20/18 0916 .  HYDROmorphone (DILAUDID) injection 1 mg, 1 mg, Intravenous, Q8H PRN, Fritzi Mandes, MD, 1 mg at 04/20/18 0410 .  HYDROmorphone (DILAUDID) tablet 1 mg, 1 mg, Oral, Q4H PRN, Borders, Kirt Boys, NP, 1 mg at 04/20/18 0156 .  levETIRAcetam (KEPPRA) 100 MG/ML solution 500 mg, 500 mg, Oral, BID, Alexis Goodell, MD, 500 mg at 04/20/18 1128 .  loperamide (IMODIUM) capsule 2 mg, 2 mg, Oral, QID PRN, Fritzi Mandes, MD .  multivitamin with minerals tablet 1 tablet, 1 tablet, Oral, Daily, Fritzi Mandes, MD, 1 tablet at 04/20/18 0916 .  naloxegol oxalate (MOVANTIK) tablet 25 mg, 25 mg, Oral, Daily, Borders, Vonna Kotyk R, NP, 25 mg at 04/20/18 1128 .  oxyCODONE (OXYCONTIN) 12 hr tablet 10 mg, 10 mg, Oral, Q12H, Fritzi Mandes, MD, 10 mg at 04/20/18 0916 .  pantoprazole (PROTONIX) EC tablet 40 mg, 40 mg, Oral, Daily, Fritzi Mandes, MD, 40 mg at 04/20/18 0916 .  potassium chloride SA (K-DUR,KLOR-CON) CR tablet 40 mEq, 40 mEq, Oral, Daily, Pyreddy, Pavan, MD, 40 mEq at 04/20/18 0917 .  prochlorperazine (COMPAZINE) tablet 10 mg, 10 mg, Oral, Q6H PRN, Fritzi Mandes, MD .  promethazine (PHENERGAN) injection 12.5 mg, 12.5 mg, Intravenous, Q6H PRN, Pyreddy, Pavan, MD, 12.5 mg at 04/19/18 1805 .  scopolamine (TRANSDERM-SCOP) 1 MG/3DAYS 1.5 mg, 1 patch, Transdermal, Q72H, Fritzi Mandes, MD, 1.5 mg at 04/19/18 2302 .  sodium phosphate (FLEET) 7-19 GM/118ML enema 1 enema, 1 enema, Rectal, Daily PRN, Pyreddy, Pavan, MD .  vitamin C (ASCORBIC ACID) tablet 250 mg, 250 mg, Oral, Daily, Fritzi Mandes, MD, 250 mg at 04/20/18 0916   Physical exam: ECOG  Vitals:   04/20/18 0416 04/20/18 1008 04/20/18 1048 04/20/18 1133  BP: (!) 152/90 (!) 164/93 (!) 152/89 (!) 159/91  Pulse: 97 (!) 119 (!) 120 (!) 115  Resp: 15   20  Temp: 98.3 F (36.8 C)   98.9 F (37.2 C)  TempSrc:    Oral  SpO2:  93% 94% 93% 93%  Weight:      Height:       Physical Exam  Constitutional: She is oriented to person, place, and time. No distress.  Chronic ill appearance.  HENT:  Head: Normocephalic and atraumatic.  Mouth/Throat: No oropharyngeal exudate.  Eyes: Pupils are equal, round, and reactive to light. EOM are normal. No scleral icterus.  Neck: Normal range of motion. Neck supple.  Cardiovascular: Normal rate and regular rhythm.  No murmur heard. Pulmonary/Chest: Effort normal. No respiratory distress. She has no rales. She exhibits no tenderness.  Decreased breath sound bilaterally.  Abdominal: Soft. Bowel sounds are normal. There is no abdominal tenderness.  Her abdomen is not distended.  Status post paracentesis today.  Musculoskeletal: Normal range of motion.        General: No edema.  Neurological: She is alert and oriented to person, place, and time. No cranial nerve deficit. She exhibits normal muscle tone. Coordination normal.  Skin: Skin is warm and dry. She is not diaphoretic. No erythema.  Psychiatric: Affect normal.        CMP Latest Ref  Rng & Units 04/20/2018  Glucose 70 - 99 mg/dL 169(H)  BUN 6 - 20 mg/dL 17  Creatinine 0.44 - 1.00 mg/dL 0.71  Sodium 135 - 145 mmol/L 134(L)  Potassium 3.5 - 5.1 mmol/L 4.7  Chloride 98 - 111 mmol/L 96(L)  CO2 22 - 32 mmol/L 31  Calcium 8.9 - 10.3 mg/dL 8.4(L)  Total Protein 6.5 - 8.1 g/dL -  Total Bilirubin 0.3 - 1.2 mg/dL -  Alkaline Phos 38 - 126 U/L -  AST 15 - 41 U/L -  ALT 0 - 44 U/L -   CBC Latest Ref Rng & Units 04/18/2018  WBC 4.0 - 10.5 K/uL 5.4  Hemoglobin 12.0 - 15.0 g/dL 11.1(L)  Hematocrit 36.0 - 46.0 % 34.3(L)  Platelets 150 - 400 K/uL 385   RADIOGRAPHIC STUDIES: I have personally reviewed the radiological images as listed and agreed with the findings in the report.  Ct Head Wo Contrast  Result Date: 04/18/2018 CLINICAL DATA:  Syncopal episode, seizures, VP shunt EXAM: CT HEAD WITHOUT CONTRAST TECHNIQUE:  Contiguous axial images were obtained from the base of the skull through the vertex without intravenous contrast. COMPARISON:  03/05/2018 MRI FINDINGS: Brain: Right frontal shunt tubing terminates in right lateral ventricle close to midline. There is diffuse enlargement of the ventricular system, difficult to exclude mild hydrocephalus or shunt malfunction. Ventricles remain enlarged and similar to the 03/05/2018 MR. Mild periventricular white matter microvascular ischemic changes. No acute intracranial hemorrhage, mass lesion, definite infarction, midline shift, herniation, extra-axial fluid collection. No focal mass effect or edema. Cisterns are patent. Cerebellar atrophy as well. Vascular: No hyperdense vessel or unexpected calcification. Skull: Normal. Negative for fracture or focal lesion. Sinuses/Orbits: No acute finding. Other: None. IMPRESSION: Interval right frontal shunt insertion. Similar diffuse ventricular enlargement of the entire ventricular system. Difficult to exclude hydrocephalus or shunt malfunction. Minor white matter microvascular changes Stable brain atrophy pattern. No other acute intracranial abnormality or hemorrhage. Electronically Signed   By: Jerilynn Mages.  Shick M.D.   On: 04/18/2018 20:20   Ct Chest Wo Contrast  Result Date: 04/04/2018 CLINICAL DATA:  Metastatic breast cancer. Status post bilateral mastectomy. EXAM: CT CHEST, ABDOMEN AND PELVIS WITHOUT CONTRAST TECHNIQUE: Multidetector CT imaging of the chest, abdomen and pelvis was performed following the standard protocol without IV contrast. COMPARISON:  02/06/2018 FINDINGS: CT CHEST FINDINGS Cardiovascular: The heart size is normal. No substantial pericardial effusion. Right Port-A-Cath tip is at the SVC/RA junction. Mediastinum/Nodes: No mediastinal lymphadenopathy. There is no hilar lymphadenopathy. Esophagus appears diffusely fluid-filled. There is no axillary lymphadenopathy. Lungs/Pleura: The central tracheobronchial airways are  patent. Stable appearance of apparent scarring in the right apex. Mild atelectasis in the right middle and lower lobes is new in the interval. There is a new small right pleural effusion. No suspicious nodule or mass in the left lung. Minimal atelectasis noted left lower lobe. Tiny left pleural effusion evident. Musculoskeletal: Stable similar diffuse heterogeneous bone mineralization CT ABDOMEN PELVIS FINDINGS Hepatobiliary: No focal abnormality in the liver parenchyma. High attenuation material in the gallbladder lumen may be related to stones or sludge. No intrahepatic or extrahepatic biliary dilation. Pancreas: No focal mass lesion. No dilatation of the main duct. No intraparenchymal cyst. No peripancreatic edema. Spleen: No splenomegaly. No focal mass lesion. Adrenals/Urinary Tract: Similar to the thickening of the left adrenal gland. Thickening of the right adrenal gland is also similar to prior. Bilateral percutaneous nephrostomy tubes been removed in the interval. There is mild to moderate right hydronephrosis with mild fullness  of the left intrarenal collecting system. Neither ureter is distended beyond its midpoint. Urinary bladder is decompressed. Stomach/Bowel: Stomach is nondistended. No gastric wall thickening. No evidence of outlet obstruction. Duodenum is normally positioned as is the ligament of Treitz. No small bowel wall thickening. No small bowel dilatation. The terminal ileum is normal. The appendix is not visualized, but there is no edema or inflammation in the region of the cecum. Colon is diffusely decompressed. Vascular/Lymphatic: No abdominal aortic aneurysm. No retroperitoneal lymphadenopathy. No pelvic sidewall lymphadenopathy. Reproductive: But The uterus is unremarkable. There is no adnexal mass. Other: Moderate to large volume ascites is new in the interval. There is vascular congestion/nodularity in the gastrocolic ligament and omentum suggestion of peritoneal thickening seen in the  left pelvis (96/2) and posterior pelvis bilaterally (102/2). Musculoskeletal: Similar appearance of heterogeneous bone mineralization with some more densely sclerotic lesions identified throughout as examples, see L5 vertebral body (84/2) left pubic bone (112/2) and posterior left inferior pubic ramus (118/2). IMPRESSION: 1. Interval development of moderate to large volume ascites with congestion and nodularity in the gastrocolic ligament and omentum. Imaging features highly concerning for metastatic involvement. 2. Interval removal of percutaneous nephrostomy tubes with mild to moderate right hydronephrosis and mild fullness of the left intrarenal collecting system. 3. Diffuse heterogeneous bone mineralization with scattered sclerotic lesion similar to prior and compatible with metastatic disease. 4. Small right and tiny left pleural effusions. 5. Urinary bladder decompressed on today's study and not well evaluated. Electronically Signed   By: Misty Stanley M.D.   On: 04/04/2018 11:55   US Renal  Result Date: 04/19/2018 CLINICAL DATA:  Nephrostomy status. EXAM: RENAL / URINARY TRACT ULTRASOUND COMPLETE COMPARISON:  None. FINDINGS: Right Kidney: Renal measurements: 11.6 x 5 x 4.3 cm = volume: 129 mL . Echogenicity within normal limits. Mild hydronephrosis is noted. Nephrostomy tube is present. Left Kidney: Renal measurements: 11.3 x 5.3 x 3.9 cm = volume: 121 mL. Echogenicity within normal limits. Moderate hydronephrosis is noted Bladder: Appears normal for degree of bladder distention. No ureteral jets are noted. Ascites are noted in the abdomen pelvis. Bilateral pleural effusions are noted. IMPRESSION: Right nephrostomy tube is identified. There is mild right hydronephrosis. Moderate left hydronephrosis is noted. Ascites identified in the abdomen and pelvis. Bilateral pleural effusions. Electronically Signed   By: Abelardo Diesel M.D.   On: 04/19/2018 11:35   US Renal  Result Date: 04/03/2018 CLINICAL DATA:   Acute kidney injury. EXAM: RENAL / URINARY TRACT ULTRASOUND COMPLETE COMPARISON:  CT of the abdomen and pelvis 02/06/2018 FINDINGS: Right Kidney: Renal measurements: 12.7 x 4.7 x 5.7 cm = volume: 171 mL. Moderate to severe recurrent right-sided hydronephrosis is present. No obstructing lesion or mass is present. Moderate renal thinning is present. Left Kidney: Renal measurements: 11.5 x 4.3 x 5.0 cm = volume: 129 mL. Moderate left-sided hydronephrosis is present. Parenchymal thickness is normal. Bladder: Under distended. Moderate diffuse abdominal ascites is present. Bilateral pleural effusions are noted. IMPRESSION: 1. Recurrent bilateral hydronephrosis, right greater than left. 2. Moderate diffuse abdominal ascites. 3. Bilateral pleural effusions. Electronically Signed   By: San Morelle M.D.   On: 04/03/2018 16:48   US Paracentesis  Result Date: 04/20/2018 INDICATION: Patient with history of metastatic breast carcinoma with recurrent malignant ascites. Request made for therapeutic paracentesis. EXAM: ULTRASOUND GUIDED THERAPEUTIC PARACENTESIS MEDICATIONS: None COMPLICATIONS: None immediate. PROCEDURE: Informed written consent was obtained from the patient after a discussion of the risks, benefits and alternatives to treatment. A timeout was performed  prior to the initiation of the procedure. Initial ultrasound scanning demonstrates a moderate to large amount of ascites within the left lower abdominal quadrant. The left lower abdomen was prepped and draped in the usual sterile fashion. 1% lidocaine was used for local anesthesia. Following this, a 6 Fr Safe-T-Centesis catheter was introduced. An ultrasound image was saved for documentation purposes. The paracentesis was performed. The catheter was removed and a dressing was applied. The patient tolerated the procedure well without immediate post procedural complication. FINDINGS: A total of approximately 3.2 liters of slightly hazy, yellow fluid was  removed. IMPRESSION: Successful ultrasound-guided therapeutic paracentesis yielding 3.2 liters of peritoneal fluid. Read by: Rowe Robert, PA-C Electronically Signed   By: Jerilynn Mages.  Shick M.D.   On: 04/20/2018 10:58   US Paracentesis  Result Date: 04/06/2018 INDICATION: Metastatic breast cancer, ascites, abdominal distension EXAM: ULTRASOUND GUIDED RIGHT PARACENTESIS MEDICATIONS: 1% LIDOCAINE LOCAL COMPLICATIONS: None immediate. PROCEDURE: Informed written consent was obtained from the patient after a discussion of the risks, benefits and alternatives to treatment. A timeout was performed prior to the initiation of the procedure. Initial ultrasound scanning demonstrates a large amount of ascites within the right lower abdominal quadrant. The right lower abdomen was prepped and draped in the usual sterile fashion. 1% lidocaine with epinephrine was used for local anesthesia. Following this, a 6 Fr Safe-T-Centesis catheter was introduced. An ultrasound image was saved for documentation purposes. The paracentesis was performed. The catheter was removed and a dressing was applied. The patient tolerated the procedure well without immediate post procedural complication. FINDINGS: A total of approximately 4.2 L of PERITONEAL fluid was removed. Samples were sent to the laboratory as requested by the clinical team. IMPRESSION: Successful ultrasound-guided paracentesis yielding 4.2 liters of peritoneal fluid. Electronically Signed   By: Jerilynn Mages.  Shick M.D.   On: 04/06/2018 11:19   Dg Abd 2 Views  Result Date: 04/18/2018 CLINICAL DATA:  Initial evaluation for constipation. EXAM: ABDOMEN - 2 VIEW COMPARISON:  None. FINDINGS: Paucity of gas limits evaluation of the bowels. No evidence for obstruction. No free air on upright view. No abnormal bowel wall thickening. Moderate retained stool within the colon, most notable at the splenic flexure. No soft tissue mass or abnormal calcification. Right-sided percutaneous nephrostomy tube in  place. No acute osseous abnormality. Right basilar atelectasis noted. Right-sided Port-A-Cath noted as well. IMPRESSION: Nonobstructive bowel gas pattern with moderate retained stool within the colon. Electronically Signed   By: Jeannine Boga M.D.   On: 04/18/2018 17:44   Ct Renal Stone Study  Result Date: 04/04/2018 CLINICAL DATA:  Metastatic breast cancer. Status post bilateral mastectomy. EXAM: CT CHEST, ABDOMEN AND PELVIS WITHOUT CONTRAST TECHNIQUE: Multidetector CT imaging of the chest, abdomen and pelvis was performed following the standard protocol without IV contrast. COMPARISON:  02/06/2018 FINDINGS: CT CHEST FINDINGS Cardiovascular: The heart size is normal. No substantial pericardial effusion. Right Port-A-Cath tip is at the SVC/RA junction. Mediastinum/Nodes: No mediastinal lymphadenopathy. There is no hilar lymphadenopathy. Esophagus appears diffusely fluid-filled. There is no axillary lymphadenopathy. Lungs/Pleura: The central tracheobronchial airways are patent. Stable appearance of apparent scarring in the right apex. Mild atelectasis in the right middle and lower lobes is new in the interval. There is a new small right pleural effusion. No suspicious nodule or mass in the left lung. Minimal atelectasis noted left lower lobe. Tiny left pleural effusion evident. Musculoskeletal: Stable similar diffuse heterogeneous bone mineralization CT ABDOMEN PELVIS FINDINGS Hepatobiliary: No focal abnormality in the liver parenchyma. High attenuation material in the  gallbladder lumen may be related to stones or sludge. No intrahepatic or extrahepatic biliary dilation. Pancreas: No focal mass lesion. No dilatation of the main duct. No intraparenchymal cyst. No peripancreatic edema. Spleen: No splenomegaly. No focal mass lesion. Adrenals/Urinary Tract: Similar to the thickening of the left adrenal gland. Thickening of the right adrenal gland is also similar to prior. Bilateral percutaneous nephrostomy  tubes been removed in the interval. There is mild to moderate right hydronephrosis with mild fullness of the left intrarenal collecting system. Neither ureter is distended beyond its midpoint. Urinary bladder is decompressed. Stomach/Bowel: Stomach is nondistended. No gastric wall thickening. No evidence of outlet obstruction. Duodenum is normally positioned as is the ligament of Treitz. No small bowel wall thickening. No small bowel dilatation. The terminal ileum is normal. The appendix is not visualized, but there is no edema or inflammation in the region of the cecum. Colon is diffusely decompressed. Vascular/Lymphatic: No abdominal aortic aneurysm. No retroperitoneal lymphadenopathy. No pelvic sidewall lymphadenopathy. Reproductive: But The uterus is unremarkable. There is no adnexal mass. Other: Moderate to large volume ascites is new in the interval. There is vascular congestion/nodularity in the gastrocolic ligament and omentum suggestion of peritoneal thickening seen in the left pelvis (96/2) and posterior pelvis bilaterally (102/2). Musculoskeletal: Similar appearance of heterogeneous bone mineralization with some more densely sclerotic lesions identified throughout as examples, see L5 vertebral body (84/2) left pubic bone (112/2) and posterior left inferior pubic ramus (118/2). IMPRESSION: 1. Interval development of moderate to large volume ascites with congestion and nodularity in the gastrocolic ligament and omentum. Imaging features highly concerning for metastatic involvement. 2. Interval removal of percutaneous nephrostomy tubes with mild to moderate right hydronephrosis and mild fullness of the left intrarenal collecting system. 3. Diffuse heterogeneous bone mineralization with scattered sclerotic lesion similar to prior and compatible with metastatic disease. 4. Small right and tiny left pleural effusions. 5. Urinary bladder decompressed on today's study and not well evaluated. Electronically Signed    By: Misty Stanley M.D.   On: 04/04/2018 11:55   Ir Nephrostomy Placement Right  Result Date: 04/06/2018 INDICATION: Metastatic breast cancer, recurrent right hydronephrosis EXAM: ULTRASOUND AND FLUOROSCOPIC RIGHT NEPHROSTOMY INSERTION COMPARISON:  04/04/2018 MEDICATIONS: CIPRO 400 MG; The antibiotic was administered in an appropriate time frame prior to skin puncture. ANESTHESIA/SEDATION: Fentanyl 50 mcg IV; Versed 2 mg IV Moderate Sedation Time:  11 minutes The patient was continuously monitored during the procedure by the interventional radiology nurse under my direct supervision. CONTRAST:  10 cc-administered into the collecting system(s) FLUOROSCOPY TIME:  Fluoroscopy Time: 0 minutes 30 seconds ( mGy). COMPLICATIONS: None immediate. PROCEDURE: Informed written consent was obtained from the patient after a thorough discussion of the procedural risks, benefits and alternatives. All questions were addressed. Maximal Sterile Barrier Technique was utilized including caps, mask, sterile gowns, sterile gloves, sterile drape, hand hygiene and skin antiseptic. A timeout was performed prior to the initiation of the procedure. Previous imaging reviewed. Patient position prone. Right hydronephrotic kidney was localized and marked. Under sterile conditions anesthesia, an 18 gauge introducer needle was advanced into a dilated mid to lower pole calyx. Needle position confirmed with ultrasound. Images obtained for documentation. There was return of urine. Guidewire inserted followed by tract dilatation to insert a 10 French nephrostomy. Retention loop formed the renal pelvis. Contrast injection confirms position. Images obtained for documentation. Catheter secured with a prolene suture and a sterile dressing. Gravity drainage bag connected. No immediate complication. Patient tolerated the procedure well. IMPRESSION: Successful ultrasound and  fluoroscopic 10 French right nephrostomy insertion Electronically Signed   By: Jerilynn Mages.   Shick M.D.   On: 04/06/2018 12:05    Assessment and plan- Patient is a 58 y.o. 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  Was seen by neurology and was started on Keppra 500 twice daily for seizure prophylaxis.  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 She has had a rapid function declining since her disease progression. I had a lengthy discussion with patient, her husband and multiple family members at bedside today.  . We discussed about the extremely poor prognosis with leptomeningeal involvement and visceral crisis. Additional chemotherapy and intrathecal chemotherapy may further deteriorate her performance status and cause additional side effects.  Patient and her husband voiced understanding her current situation and strongly desires further treatment.  Expected life expectancy is less than 3 months.  Palliative care has been consulted and patient has establish care with [facility].  I discussed about CODE STATUS.  Currently she is full code and she and her husband will think about CODE STATUS and will update nurse if they agree with changing to DNR/DNI  #Malignant ascites, status post another therapeutic paracentesis today and 3.2 L of fluid was removed. She felt her abdomen is less distended after procedure and the nausea has slightly get better.  #Nausea, multifactorial secondary to CNS involvement, severe constipation, less likely due to the lingering effects from recent Taxol. Agree with adding Decadron 4 mg every 12 hours, continue antiemetics Compazine and Phenergan every 6 hours as needed.  Continue scopolamine patch.  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  Given that she is currently quite weak and fatigued and just status post therapeutic paracentesis, I discussed with patient about delaying today's treatment and she will be reassessed on Monday.  Continue Keppra 5 mg twice daily for seizure prophylaxis. #Severe constipation, agree with adding Movantik to see if that will help with her bowel movements. #Status post nephrostomy tube placement.  Creatinine stable.   Thank you for allowing me to participate in the care of this patient.  Total face to face encounter time for this patient visit was 70 min. >50% of the time was  spent in counseling and coordination of care.    Earlie Server, MD, PhD Hematology Oncology [facility] Pager- [phone] 04/20/2018

## 2018-04-20 NOTE — Procedures (Signed)
Ultrasound-guided  therapeutic paracentesis performed yielding 3.2 liters of slightly hazy, yellow  fluid. No immediate complications. EBL none.

## 2018-04-20 NOTE — Progress Notes (Signed)
   04/20/18 1200  Clinical Encounter Type  Visited With Patient and family together  Visit Type Follow-up  Referral From Nurse  Stress Factors  Patient Stress Factors Health changes;Major life changes  Family Stress Factors Major life changes  Pt received a difficult news from her doctor. Ch had a previous visit with the pt and husband, which helped me establishing a further rapport. Pt's parents, husband, two sisters were in the waiting room. Ch helped the family share their thoughts and emotions and prayed for strength. Ch and family members entered the patient room. Pt was alert but kept her eyes closed and was upset. Pt asked the question of why her cancer is not being healed. Ch provided a listening ear and let her express her grief. Ch again prayed with the family members and the pt for healing and strength. Family members expressed their love towards the pt. Pt appreciated the visit.

## 2018-04-20 NOTE — Progress Notes (Signed)
Follow up visit made. Patient resting comfortably. She still has not had a BM.   I met with patient's husband in private. Family met today with Dr. Tasia Catchings and was informed of her poor prognosis and that she might be nearing end of life. Husband relays this information to me and we spoke at length about decision making. He stated several times that he would prefer to focus on quality of life over quantity. However, he is hopeful that patient might be able to still get treatment but will rely on Dr. Tasia Catchings to tell him if there is any hope that the treatment would be efficacious. Otherwise, he seems resigned to the idea of keeping her home and comfortable at end of life.   We talked about code status. Husband says he does not want to see patient traumatized by resuscitative efforts. He would support DNR but wants to talk to patient about the decision in private (when she is not being visited by her parents).

## 2018-04-20 NOTE — Care Management Note (Signed)
Case Management Note  Patient Details  Name: Mary [name] MRN: [id number] Date of Birth: [birth year]  Subjective/Objective:  RNCM was consulted to discuss transition of care planning. Patient lives at home with her spouse. Patient has 24 hour a day caregivers. They previously were open to [place] care but have since cancelled since they have 24 supports. Patient has walker, wheelchair and bedside commode all in the home. Family requesting a hospital bed. Patient getting chemo treatments and has outpatient palliative set up. At this point the Oncologist expressed that there prognosis was very poor. Family tearful and does not prefer to talk about hospice or about going home right now. There original preference was to dischrge home with 24 hr caregivers and a hospital bed from advanced but since the changes in status they may ultimately change course and go with hospice.                   Action/Plan: RNCM to continue to follow to assist with discharge planning.   Expected Discharge Date:  04/19/18               Expected Discharge Plan:     In-House Referral:     Discharge planning Services     Post Acute Care Choice:    Choice offered to:     DME Arranged:    DME Agency:     HH Arranged:    HH Agency:     Status of Service:     If discussed at [employer], dates discussed:    Additional Comments:  Latanya Maudlin, RN 04/20/2018, 11:55 AM

## 2018-04-20 NOTE — Care Management Note (Signed)
Case Management Note  Patient Details  Name: MURPHY BUNDICK MRN: [id number] Date of Birth: Jul 25, 1960  Subjective/Objective: Patient currently at home with 24 hour caregivers. They have declined home health in favor of the caregivers. They have all needed DME including walker, wheelchair, cane and bedside commode. Their only request is DME hospital bed. DME ordered obtain from MD. Hospital bed narrative in place. Referral for DME given to [facility]. Patient likely to discharge tomorrow.                   Action/Plan:   Expected Discharge Date:  04/19/18               Expected Discharge Plan:  [place]  In-House Referral:     Discharge planning Services  CM Consult  Post Acute Care Choice:  Durable Medical Equipment Choice offered to:  Patient  DME Arranged:  Hospital bed DME Agency:  [place]:    [place]:     Status of Service:  Completed, signed off  If discussed at [place] of Stay Meetings, dates discussed:    Additional Comments:  Latanya Maudlin, RN 04/20/2018, 2:20 PM

## 2018-04-20 NOTE — Progress Notes (Signed)
[place]  Telephone:([phone] [phone]   Name: Mary [name] Date: 04/20/2018 MRN: [id number]  DOB: October 22, 1960  Patient Care Team: [name], MD as PCP - General (Internal Medicine) Barrington Ellison, RN as [facility] Management    REASON FOR CONSULTATION: Palliative Care consult requested for this 58 y.o. female with multiple medical problems including stage IV breast cancer (initially diagnosed in August 2013) status post neoadjuvant chemotherapy and bilateral mastectomy and adjuvant RT.  Patient was found to have disease with recurrence in September 2019 when she developed obstructive nephropathy requiring nephrostomy tubes.  Patient was found to have metastatic disease to bone, bladder wall, and peritoneum.  She was started on Taxol treatment.  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  She is status post Ommaya reservoir (placed on 04/13/2018) with plan to receive intrathecal methotrexate.  PMH also notable for CKD stage II, history of DVT (11/2017) on Eliquis, ascites requiring paracentesis, IBS, anxiety, and depression.  Patient was recently hospitalized 04/07/19/18/20 with lysed weakness and acute kidney injury secondary to dehydration.  Patient's performance status has rapidly declined and she is now bedbound.  She has had transient periods of unresponsiveness and was admitted to the hospital on 04/18/2018 for evaluation.  Palliative care was consulted to help address goals.  CODE STATUS: Full code  PAST MEDICAL HISTORY: Past Medical History:  Diagnosis Date  . Anxiety    takes Xanax prn  . Breast cancer [facility]) August 2013   bilateral, er/pr+, bladder (02/2018..mets from breast ca)  . Chronic kidney disease 01/2018   recent shut down of kidneys with labs out of whack  . Complication of anesthesia    pt  states she had the shakes extremely bad  . Cough    at night d/t reflux;nothing productive  . Dental crowns present    x 4  . Depression   . GERD (gastroesophageal reflux disease)    takes Nexium daily  . History of bronchitis    08/2011  . History of colon polyps   . Hot flashes, menopausal 08/08/2012  . IBS (irritable bowel syndrome)    immodium prn  . Insomnia    takes Ambien nightly  . Irritable bowel syndrome   . 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 Neutropenia, drug-induced ([place]) 12/15/2011  . Nocturia   . Plantar fasciitis   . S/P radiation therapy 07/03/2012 through 08/18/2038                                                        Right chest wall/regional lymph nodes 5040 cGy 28 sessions, right chest wall/mastectomy scar boost 1000 cGy 5 sessions                          . Status post chemotherapy    FEC 100 starting on 12/08/2011 - 01/18/12/single agent Taxol x12 weeks from 02/02/2012 through January 2014   . Urinary frequency   . Use of tamoxifen (Nolvadex)    now stopped. will be starting ibrance on 03/05/18  . Vertigo     PAST SURGICAL HISTORY:  Past Surgical History:  Procedure Laterality Date  . BREAST LUMPECTOMY  2008  left  . COLONOSCOPY    . COLONOSCOPY WITH PROPOFOL N/A 10/10/2017   Procedure: COLONOSCOPY WITH PROPOFOL;  Surgeon: Lucilla Lame, MD;  Location: [facility] END[facility]COPY;  Service: Endoscopy;  Laterality: N/A;  . CYSTOSCOPY WITH STENT PLACEMENT Bilateral 12/17/2017   Procedure: CYSTOSCOPY WITH STENT PLACEMENT;  Surgeon: Hollice Espy, MD;  Location: [facility] ORS;  Service: Urology;  Laterality: Bilateral;  . IR NEPHROSTOMY EXCHANGE LEFT  02/13/2018  . IR NEPHROSTOMY EXCHANGE RIGHT  01/02/2018  . IR NEPHROSTOMY EXCHANGE RIGHT  02/13/2018  . IR NEPHROSTOMY PLACEMENT LEFT  12/19/2017  . IR NEPHROSTOMY PLACEMENT RIGHT  12/19/2017  . IR NEPHROSTOMY PLACEMENT RIGHT  04/06/2018  . LUMBAR PUNCTURE  03/01/2018  . MASTECTOMY Bilateral 2013  . MASTECTOMY WITH  AXILLARY LYMPH NODE DISSECTION Right 05/18/2012   Procedure: MASTECTOMY WITH AXILLARY LYMPH NODE DISSECTION;  Surgeon: Adin Hector, MD;  Location: [place];  Service: General;  Laterality: Right;  Bilateral Total Mastectomy , right axillary lymph node dissection left axillary sentinel node biopsy, removal of Porta Cath  . NEPHROSTOMY TUBE REMOVAL Bilateral 03/01/2018   removed tubes in dr. Erlene Quan office  . PORT-A-CATH REMOVAL N/A 05/18/2012   Procedure: REMOVAL PORT-A-CATH;  Surgeon: Adin Hector, MD;  Location: [place];  Service: General;  Laterality: N/A;  . PORTA CATH INSERTION N/A 12/26/2017   Procedure: PORTA CATH INSERTION;  Surgeon: Katha Cabal, MD;  Location: [place] CV [facility];  Service: Cardiovascular;  Laterality: N/A;  . PORTACATH PLACEMENT  12/02/2011   Procedure: INSERTION PORT-A-CATH;  Surgeon: Adin Hector, MD;  Location: [place];  Service: General;  Laterality: N/A;  insertion port a cath with fluoroscopy  . SIMPLE MASTECTOMY WITH AXILLARY SENTINEL NODE BIOPSY Left 05/18/2012   Procedure: SIMPLE MASTECTOMY WITH AXILLARY SENTINEL NODE BIOPSY;  Surgeon: Adin Hector, MD;  Location: [place];  Service: General;  Laterality: Left;    HEMATOLOGY/ONCOLOGY HISTORY:    Primary cancer of upper outer quadrant of left female breast ([place])   11/17/2011 Initial Diagnosis    Cancer of upper-outer quadrant of female breast diagnosed when pt presented with Nipple flattening and palpable lesion.    12/08/2011 - 03/29/2012 Chemotherapy    Neo adjuvant FEC X 4 followed by Weekly Taxol X 12    05/18/2012 Surgery    Bilateral Mastectomies: Right revealed invasive  Lobular carcinoma measuring 2.3 cm 8 of 17 lymph nodes were positive (T2 N2A). 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    07/04/2012 - 08/17/2012 Radiation Therapy    Rt Breast irradiation    08/19/2012 -  Anti-estrogen oral therapy    Initially Tamoxifen + Zolodex  and changed to Aromasin + Zolodex Monthly since 08/2013    12/22/2017 -  Chemotherapy    The patient had PACLitaxel (TAXOL) 144 mg in sodium chloride 0.9 % 250 mL chemo infusion (</= '80mg'$ /m2), 80 mg/m2 = 144 mg, Intravenous,  Once, 3 of 4 cycles Administration: 144 mg (12/22/2017), 144 mg (12/29/2017), 144 mg (01/05/2018), 144 mg (01/19/2018), 144 mg (01/26/2018), 144 mg (02/02/2018), 144 mg (04/05/2018), 144 mg (04/17/2018)  for chemotherapy treatment.     04/20/2018 -  Chemotherapy    The patient had methotrexate (PF) 12 mg in sodium chloride 0.9 % INTRATHECAL chemo injection, , Intrathecal,  Once, 0 of 6 cycles  for chemotherapy treatment.      Metastatic breast cancer ([place])   12/19/2017 Initial Diagnosis    Metastatic breast cancer ([place])  12/22/2017 -  Chemotherapy    The patient had PACLitaxel (TAXOL) 144 mg in sodium chloride 0.9 % 250 mL chemo infusion (</= 80mg /m2), 80 mg/m2 = 144 mg, Intravenous,  Once, 3 of 4 cycles Administration: 144 mg (12/22/2017), 144 mg (12/29/2017), 144 mg (01/05/2018), 144 mg (01/19/2018), 144 mg (01/26/2018), 144 mg (02/02/2018), 144 mg (04/05/2018), 144 mg (04/17/2018)  for chemotherapy treatment.     04/20/2018 -  Chemotherapy    The patient had methotrexate (PF) 12 mg in sodium chloride 0.9 % INTRATHECAL chemo injection, , Intrathecal,  Once, 0 of 6 cycles  for chemotherapy treatment.      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      ALLERGIES:  is allergic to adhesive [tape] and albumin (human).  MEDICATIONS:  Current Facility-Administered Medications  Medication Dose Route Frequency Provider Last Rate Last Dose  . acetaminophen (TYLENOL) tablet 650 mg  650 mg Oral Q6H PRN Fritzi Mandes, MD       Or  . acetaminophen (TYLENOL) suppository 650 mg  650 mg Rectal Q6H PRN Fritzi Mandes, MD      . ALPRAZolam Duanne Moron) tablet 0.25 mg  0.25 mg Oral BID PRN Fritzi Mandes, MD   0.25 mg at 04/19/18 2257  . amLODipine (NORVASC) tablet 5 mg  5 mg Oral Daily Fritzi Mandes, MD   5 mg at 04/20/18 0916  . apixaban (ELIQUIS) tablet 2.5 mg  2.5 mg Oral BID Fritzi Mandes, MD   2.5 mg at 04/20/18 0916  . dexamethasone (DECADRON) tablet 4 mg  4 mg Oral Q12H Borders, Kirt Boys, NP   4 mg at 04/20/18 0916  . diazepam (VALIUM) tablet 10 mg  10 mg Oral QHS PRN Fritzi Mandes, MD      . dronabinol (MARINOL) capsule 5 mg  5 mg Oral BID AC Fritzi Mandes, MD   5 mg at 04/20/18 1130  . ferrous sulfate tablet 325 mg  325 mg Oral Daily Fritzi Mandes, MD   325 mg at 04/20/18 0916  . furosemide (LASIX) injection 40 mg  40 mg Intravenous Daily Saundra Shelling, MD   40 mg at 04/20/18 0916  . HYDROmorphone (DILAUDID) injection 1 mg  1 mg Intravenous Q8H PRN Fritzi Mandes, MD   1 mg at 04/20/18 0410  . HYDROmorphone (DILAUDID) tablet 1 mg  1 mg Oral Q4H PRN Borders, Kirt Boys, NP   1 mg at 04/20/18 0156  . levETIRAcetam (KEPPRA) 100 MG/ML solution 500 mg  500 mg Oral BID Alexis Goodell, MD   500 mg at 04/20/18 1128  . loperamide (IMODIUM) capsule 2 mg  2 mg Oral QID PRN Fritzi Mandes, MD      . multivitamin with minerals tablet 1 tablet  1 tablet Oral Daily Fritzi Mandes, MD   1 tablet at 04/20/18 0916  . naloxegol oxalate (  MOVANTIK) tablet 25 mg  25 mg Oral Daily Borders, Kirt Boys, NP   25 mg at 04/20/18 1128  . oxyCODONE (OXYCONTIN) 12 hr tablet 10 mg  10 mg Oral Q12H  Fritzi Mandes, MD   10 mg at 04/20/18 0916  . pantoprazole (PROTONIX) EC tablet 40 mg  40 mg Oral Daily Fritzi Mandes, MD   40 mg at 04/20/18 0916  . potassium chloride SA (K-DUR,KLOR-CON) CR tablet 40 mEq  40 mEq Oral Daily Saundra Shelling, MD   40 mEq at 04/20/18 0917  . prochlorperazine (COMPAZINE) tablet 10 mg  10 mg Oral Q6H PRN Fritzi Mandes, MD      . promethazine (PHENERGAN) injection 12.5 mg  12.5 mg Intravenous Q6H PRN Saundra Shelling, MD   12.5 mg at 04/19/18 1805  . scopolamine (TRANSDERM-SCOP) 1 MG/3DAYS 1.5 mg  1 patch Transdermal Q72H Fritzi Mandes, MD   1.5 mg at 04/19/18 2302  . sodium phosphate (FLEET) 7-19 GM/118ML enema 1 enema  1 enema Rectal Daily PRN Pyreddy, Reatha Harps, MD      . vitamin C (ASCORBIC ACID) tablet 250 mg  250 mg Oral Daily Fritzi Mandes, MD   250 mg at 04/20/18 0916    VITAL SIGNS: BP (!) 159/91 (BP Location: Left Arm)   Pulse (!) 115   Temp 98.9 F (37.2 C) (Oral)   Resp 20   Ht 5\' 5"  (1.651 m)   Wt 146 lb (66.2 kg)   LMP 11/28/2011   SpO2 93%   BMI 24.30 kg/m  Filed Weights   04/18/18 1500  Weight: 146 lb (66.2 kg)    Estimated body mass index is 24.3 kg/m as calculated from the following:   Height as of this encounter: 5\' 5"  (1.651 m).   Weight as of this encounter: 146 lb (66.2 kg).  LABS: CBC:    Component Value Date/Time   WBC 5.4 04/18/2018 1503   HGB 11.1 (L) 04/18/2018 1503   HGB 14.8 04/29/2014 1327   HCT 34.3 (L) 04/18/2018 1503   HCT 43.8 04/29/2014 1327   PLT 385 04/18/2018 1503   PLT 206 04/29/2014 1327   MCV 92.0 04/18/2018 1503   MCV 95.0 04/29/2014 1327   NEUTROABS 3.0 04/17/2018 0815   NEUTROABS 3.9 04/29/2014 1327   LYMPHSABS 0.6 (L) 04/17/2018 0815   LYMPHSABS 1.7 04/29/2014 1327   MONOABS 0.4 04/17/2018 0815   MONOABS 0.6 04/29/2014 1327   EOSABS 0.0 04/17/2018 0815   EOSABS 0.1 04/29/2014 1327   BASOSABS 0.1 04/17/2018 0815   BASOSABS 0.0 04/29/2014 1327   Comprehensive Metabolic Panel:    Component Value Date/Time     NA 134 (L) 04/20/2018 0518   NA 138 12/16/2017   NA 141 04/29/2014 1327   K 4.7 04/20/2018 0518   K 4.0 04/29/2014 1327   CL 96 (L) 04/20/2018 0518   CL 106 08/20/2012 0802   CO2 31 04/20/2018 0518   CO2 24 04/29/2014 1327   BUN 17 04/20/2018 0518   BUN 30 (A) 12/16/2017   BUN 15.7 04/29/2014 1327   CREATININE 0.71 04/20/2018 0518   CREATININE 1.58 (H) 12/15/2017 1525   CREATININE 0.9 04/29/2014 1327   GLUCOSE 169 (H) 04/20/2018 0518   GLUCOSE 97 04/29/2014 1327   GLUCOSE 100 (H) 08/20/2012 0802   CALCIUM 8.4 (L) 04/20/2018 0518   CALCIUM 9.7 04/29/2014 1327   AST 72 (H) 04/18/2018 1503   AST 21 04/29/2014 1327   ALT 40 04/18/2018 1503   ALT 30 04/29/2014  1327   ALKPHOS 175 (H) 04/18/2018 1503   ALKPHOS 124 04/29/2014 1327   BILITOT 0.3 04/18/2018 1503   BILITOT 0.34 04/29/2014 1327   PROT 6.4 (L) 04/18/2018 1503   PROT 7.6 04/29/2014 1327   ALBUMIN 2.9 (L) 04/18/2018 1503   ALBUMIN 4.1 04/29/2014 1327    RADIOGRAPHIC STUDIES: Ct Head Wo Contrast  Result Date: 04/18/2018 CLINICAL DATA:  Syncopal episode, seizures, VP shunt EXAM: CT HEAD WITHOUT CONTRAST TECHNIQUE: Contiguous axial images were obtained from the base of the skull through the vertex without intravenous contrast. COMPARISON:  03/05/2018 MRI FINDINGS: Brain: Right frontal shunt tubing terminates in right lateral ventricle close to midline. There is diffuse enlargement of the ventricular system, difficult to exclude mild hydrocephalus or shunt malfunction. Ventricles remain enlarged and similar to the 03/05/2018 MR. Mild periventricular white matter microvascular ischemic changes. No acute intracranial hemorrhage, mass lesion, definite infarction, midline shift, herniation, extra-axial fluid collection. No focal mass effect or edema. Cisterns are patent. Cerebellar atrophy as well. Vascular: No hyperdense vessel or unexpected calcification. Skull: Normal. Negative for fracture or focal lesion. Sinuses/Orbits: No  acute finding. Other: None. IMPRESSION: Interval right frontal shunt insertion. Similar diffuse ventricular enlargement of the entire ventricular system. Difficult to exclude hydrocephalus or shunt malfunction. Minor white matter microvascular changes Stable brain atrophy pattern. No other acute intracranial abnormality or hemorrhage. Electronically Signed   By: Jerilynn Mages.  Shick M.D.   On: 04/18/2018 20:20   Ct Chest Wo Contrast  Result Date: 04/04/2018 CLINICAL DATA:  Metastatic breast cancer. Status post bilateral mastectomy. EXAM: CT CHEST, ABDOMEN AND PELVIS WITHOUT CONTRAST TECHNIQUE: Multidetector CT imaging of the chest, abdomen and pelvis was performed following the standard protocol without IV contrast. COMPARISON:  02/06/2018 FINDINGS: CT CHEST FINDINGS Cardiovascular: The heart size is normal. No substantial pericardial effusion. Right Port-A-Cath tip is at the SVC/RA junction. Mediastinum/Nodes: No mediastinal lymphadenopathy. There is no hilar lymphadenopathy. Esophagus appears diffusely fluid-filled. There is no axillary lymphadenopathy. Lungs/Pleura: The central tracheobronchial airways are patent. Stable appearance of apparent scarring in the right apex. Mild atelectasis in the right middle and lower lobes is new in the interval. There is a new small right pleural effusion. No suspicious nodule or mass in the left lung. Minimal atelectasis noted left lower lobe. Tiny left pleural effusion evident. Musculoskeletal: Stable similar diffuse heterogeneous bone mineralization CT ABDOMEN PELVIS FINDINGS Hepatobiliary: No focal abnormality in the liver parenchyma. High attenuation material in the gallbladder lumen may be related to stones or sludge. No intrahepatic or extrahepatic biliary dilation. Pancreas: No focal mass lesion. No dilatation of the main duct. No intraparenchymal cyst. No peripancreatic edema. Spleen: No splenomegaly. No focal mass lesion. Adrenals/Urinary Tract: Similar to the thickening of  the left adrenal gland. Thickening of the right adrenal gland is also similar to prior. Bilateral percutaneous nephrostomy tubes been removed in the interval. There is mild to moderate right hydronephrosis with mild fullness of the left intrarenal collecting system. Neither ureter is distended beyond its midpoint. Urinary bladder is decompressed. Stomach/Bowel: Stomach is nondistended. No gastric wall thickening. No evidence of outlet obstruction. Duodenum is normally positioned as is the ligament of Treitz. No small bowel wall thickening. No small bowel dilatation. The terminal ileum is normal. The appendix is not visualized, but there is no edema or inflammation in the region of the cecum. Colon is diffusely decompressed. Vascular/Lymphatic: No abdominal aortic aneurysm. No retroperitoneal lymphadenopathy. No pelvic sidewall lymphadenopathy. Reproductive: But The uterus is unremarkable. There is no adnexal mass. Other:  Moderate to large volume ascites is new in the interval. There is vascular congestion/nodularity in the gastrocolic ligament and omentum suggestion of peritoneal thickening seen in the left pelvis (96/2) and posterior pelvis bilaterally (102/2). Musculoskeletal: Similar appearance of heterogeneous bone mineralization with some more densely sclerotic lesions identified throughout as examples, see L5 vertebral body (84/2) left pubic bone (112/2) and posterior left inferior pubic ramus (118/2). IMPRESSION: 1. Interval development of moderate to large volume ascites with congestion and nodularity in the gastrocolic ligament and omentum. Imaging features highly concerning for metastatic involvement. 2. Interval removal of percutaneous nephrostomy tubes with mild to moderate right hydronephrosis and mild fullness of the left intrarenal collecting system. 3. Diffuse heterogeneous bone mineralization with scattered sclerotic lesion similar to prior and compatible with metastatic disease. 4. Small right and  tiny left pleural effusions. 5. Urinary bladder decompressed on today's study and not well evaluated. Electronically Signed   By: Misty Stanley M.D.   On: 04/04/2018 11:55   US Renal  Result Date: 04/19/2018 CLINICAL DATA:  Nephrostomy status. EXAM: RENAL / URINARY TRACT ULTRASOUND COMPLETE COMPARISON:  None. FINDINGS: Right Kidney: Renal measurements: 11.6 x 5 x 4.3 cm = volume: 129 mL . Echogenicity within normal limits. Mild hydronephrosis is noted. Nephrostomy tube is present. Left Kidney: Renal measurements: 11.3 x 5.3 x 3.9 cm = volume: 121 mL. Echogenicity within normal limits. Moderate hydronephrosis is noted Bladder: Appears normal for degree of bladder distention. No ureteral jets are noted. Ascites are noted in the abdomen pelvis. Bilateral pleural effusions are noted. IMPRESSION: Right nephrostomy tube is identified. There is mild right hydronephrosis. Moderate left hydronephrosis is noted. Ascites identified in the abdomen and pelvis. Bilateral pleural effusions. Electronically Signed   By: Abelardo Diesel M.D.   On: 04/19/2018 11:35   US Renal  Result Date: 04/03/2018 CLINICAL DATA:  Acute kidney injury. EXAM: RENAL / URINARY TRACT ULTRASOUND COMPLETE COMPARISON:  CT of the abdomen and pelvis 02/06/2018 FINDINGS: Right Kidney: Renal measurements: 12.7 x 4.7 x 5.7 cm = volume: 171 mL. Moderate to severe recurrent right-sided hydronephrosis is present. No obstructing lesion or mass is present. Moderate renal thinning is present. Left Kidney: Renal measurements: 11.5 x 4.3 x 5.0 cm = volume: 129 mL. Moderate left-sided hydronephrosis is present. Parenchymal thickness is normal. Bladder: Under distended. Moderate diffuse abdominal ascites is present. Bilateral pleural effusions are noted. IMPRESSION: 1. Recurrent bilateral hydronephrosis, right greater than left. 2. Moderate diffuse abdominal ascites. 3. Bilateral pleural effusions. Electronically Signed   By: San Morelle M.D.   On:  04/03/2018 16:48   US Paracentesis  Result Date: 04/20/2018 INDICATION: Patient with history of metastatic breast carcinoma with recurrent malignant ascites. Request made for therapeutic paracentesis. EXAM: ULTRASOUND GUIDED THERAPEUTIC PARACENTESIS MEDICATIONS: None COMPLICATIONS: None immediate. PROCEDURE: Informed written consent was obtained from the patient after a discussion of the risks, benefits and alternatives to treatment. A timeout was performed prior to the initiation of the procedure. Initial ultrasound scanning demonstrates a moderate to large amount of ascites within the left lower abdominal quadrant. The left lower abdomen was prepped and draped in the usual sterile fashion. 1% lidocaine was used for local anesthesia. Following this, a 6 Fr Safe-T-Centesis catheter was introduced. An ultrasound image was saved for documentation purposes. The paracentesis was performed. The catheter was removed and a dressing was applied. The patient tolerated the procedure well without immediate post procedural complication. FINDINGS: A total of approximately 3.2 liters of slightly hazy, yellow fluid was removed. IMPRESSION:  Successful ultrasound-guided therapeutic paracentesis yielding 3.2 liters of peritoneal fluid. Read by: Rowe Robert, PA-C Electronically Signed   By: Jerilynn Mages.  Shick M.D.   On: 04/20/2018 10:58   US Paracentesis  Result Date: 04/06/2018 INDICATION: Metastatic breast cancer, ascites, abdominal distension EXAM: ULTRASOUND GUIDED RIGHT PARACENTESIS MEDICATIONS: 1% LIDOCAINE LOCAL COMPLICATIONS: None immediate. PROCEDURE: Informed written consent was obtained from the patient after a discussion of the risks, benefits and alternatives to treatment. A timeout was performed prior to the initiation of the procedure. Initial ultrasound scanning demonstrates a large amount of ascites within the right lower abdominal quadrant. The right lower abdomen was prepped and draped in the usual sterile fashion.  1% lidocaine with epinephrine was used for local anesthesia. Following this, a 6 Fr Safe-T-Centesis catheter was introduced. An ultrasound image was saved for documentation purposes. The paracentesis was performed. The catheter was removed and a dressing was applied. The patient tolerated the procedure well without immediate post procedural complication. FINDINGS: A total of approximately 4.2 L of PERITONEAL fluid was removed. Samples were sent to the laboratory as requested by the clinical team. IMPRESSION: Successful ultrasound-guided paracentesis yielding 4.2 liters of peritoneal fluid. Electronically Signed   By: Jerilynn Mages.  Shick M.D.   On: 04/06/2018 11:19   Dg Abd 2 Views  Result Date: 04/18/2018 CLINICAL DATA:  Initial evaluation for constipation. EXAM: ABDOMEN - 2 VIEW COMPARISON:  None. FINDINGS: Paucity of gas limits evaluation of the bowels. No evidence for obstruction. No free air on upright view. No abnormal bowel wall thickening. Moderate retained stool within the colon, most notable at the splenic flexure. No soft tissue mass or abnormal calcification. Right-sided percutaneous nephrostomy tube in place. No acute osseous abnormality. Right basilar atelectasis noted. Right-sided Port-A-Cath noted as well. IMPRESSION: Nonobstructive bowel gas pattern with moderate retained stool within the colon. Electronically Signed   By: Jeannine Boga M.D.   On: 04/18/2018 17:44   Ct Renal Stone Study  Result Date: 04/04/2018 CLINICAL DATA:  Metastatic breast cancer. Status post bilateral mastectomy. EXAM: CT CHEST, ABDOMEN AND PELVIS WITHOUT CONTRAST TECHNIQUE: Multidetector CT imaging of the chest, abdomen and pelvis was performed following the standard protocol without IV contrast. COMPARISON:  02/06/2018 FINDINGS: CT CHEST FINDINGS Cardiovascular: The heart size is normal. No substantial pericardial effusion. Right Port-A-Cath tip is at the SVC/RA junction. Mediastinum/Nodes: No mediastinal  lymphadenopathy. There is no hilar lymphadenopathy. Esophagus appears diffusely fluid-filled. There is no axillary lymphadenopathy. Lungs/Pleura: The central tracheobronchial airways are patent. Stable appearance of apparent scarring in the right apex. Mild atelectasis in the right middle and lower lobes is new in the interval. There is a new small right pleural effusion. No suspicious nodule or mass in the left lung. Minimal atelectasis noted left lower lobe. Tiny left pleural effusion evident. Musculoskeletal: Stable similar diffuse heterogeneous bone mineralization CT ABDOMEN PELVIS FINDINGS Hepatobiliary: No focal abnormality in the liver parenchyma. High attenuation material in the gallbladder lumen may be related to stones or sludge. No intrahepatic or extrahepatic biliary dilation. Pancreas: No focal mass lesion. No dilatation of the main duct. No intraparenchymal cyst. No peripancreatic edema. Spleen: No splenomegaly. No focal mass lesion. Adrenals/Urinary Tract: Similar to the thickening of the left adrenal gland. Thickening of the right adrenal gland is also similar to prior. Bilateral percutaneous nephrostomy tubes been removed in the interval. There is mild to moderate right hydronephrosis with mild fullness of the left intrarenal collecting system. Neither ureter is distended beyond its midpoint. Urinary bladder is decompressed. Stomach/Bowel: Stomach is  nondistended. No gastric wall thickening. No evidence of outlet obstruction. Duodenum is normally positioned as is the ligament of Treitz. No small bowel wall thickening. No small bowel dilatation. The terminal ileum is normal. The appendix is not visualized, but there is no edema or inflammation in the region of the cecum. Colon is diffusely decompressed. Vascular/Lymphatic: No abdominal aortic aneurysm. No retroperitoneal lymphadenopathy. No pelvic sidewall lymphadenopathy. Reproductive: But The uterus is unremarkable. There is no adnexal mass. Other:  Moderate to large volume ascites is new in the interval. There is vascular congestion/nodularity in the gastrocolic ligament and omentum suggestion of peritoneal thickening seen in the left pelvis (96/2) and posterior pelvis bilaterally (102/2). Musculoskeletal: Similar appearance of heterogeneous bone mineralization with some more densely sclerotic lesions identified throughout as examples, see L5 vertebral body (84/2) left pubic bone (112/2) and posterior left inferior pubic ramus (118/2). IMPRESSION: 1. Interval development of moderate to large volume ascites with congestion and nodularity in the gastrocolic ligament and omentum. Imaging features highly concerning for metastatic involvement. 2. Interval removal of percutaneous nephrostomy tubes with mild to moderate right hydronephrosis and mild fullness of the left intrarenal collecting system. 3. Diffuse heterogeneous bone mineralization with scattered sclerotic lesion similar to prior and compatible with metastatic disease. 4. Small right and tiny left pleural effusions. 5. Urinary bladder decompressed on today's study and not well evaluated. Electronically Signed   By: Misty Stanley M.D.   On: 04/04/2018 11:55   Ir Nephrostomy Placement Right  Result Date: 04/06/2018 INDICATION: Metastatic breast cancer, recurrent right hydronephrosis EXAM: ULTRASOUND AND FLUOROSCOPIC RIGHT NEPHROSTOMY INSERTION COMPARISON:  04/04/2018 MEDICATIONS: CIPRO 400 MG; The antibiotic was administered in an appropriate time frame prior to skin puncture. ANESTHESIA/SEDATION: Fentanyl 50 mcg IV; Versed 2 mg IV Moderate Sedation Time:  11 minutes The patient was continuously monitored during the procedure by the interventional radiology nurse under my direct supervision. CONTRAST:  10 cc-administered into the collecting system(s) FLUOROSCOPY TIME:  Fluoroscopy Time: 0 minutes 30 seconds ( mGy). COMPLICATIONS: None immediate. PROCEDURE: Informed written consent was obtained from the  patient after a thorough discussion of the procedural risks, benefits and alternatives. All questions were addressed. Maximal Sterile Barrier Technique was utilized including caps, mask, sterile gowns, sterile gloves, sterile drape, hand hygiene and skin antiseptic. A timeout was performed prior to the initiation of the procedure. Previous imaging reviewed. Patient position prone. Right hydronephrotic kidney was localized and marked. Under sterile conditions anesthesia, an 18 gauge introducer needle was advanced into a dilated mid to lower pole calyx. Needle position confirmed with ultrasound. Images obtained for documentation. There was return of urine. Guidewire inserted followed by tract dilatation to insert a 10 French nephrostomy. Retention loop formed the renal pelvis. Contrast injection confirms position. Images obtained for documentation. Catheter secured with a prolene suture and a sterile dressing. Gravity drainage bag connected. No immediate complication. Patient tolerated the procedure well. IMPRESSION: Successful ultrasound and fluoroscopic 10 French right nephrostomy insertion Electronically Signed   By: Jerilynn Mages.  Shick M.D.   On: 04/06/2018 12:05    PERFORMANCE STATUS (ECOG) : 4 - Bedbound  Review of Systems As noted above. Otherwise, a complete review of systems is negative.  Physical Exam General: ill appearing Cardiovascular: regular rate and rhythm Pulmonary: clear ant fields Abdomen: full, firm, nttp GU: no suprapubic tenderness Extremities: no edema, no joint deformities Skin: no rashes Neurological: Weakness, alert and oriented  IMPRESSION: Patient is alert and oriented this morning.  She says that she symptomatically feels better.  She denies any current pain, shortness of breath, or nausea.  However, she apparently did have an episode of vomiting last night.  She has also continued to require PRN opioids for pain.    Patient continues to have severe constipation and has not had  a bowel movement in over a week despite increased laxatives, enemas, and an attempt at manual disimpaction.  Case discussed with Dr. Tasia Catchings.  Will give trial of Movantik.  Patient is pending therapeutic paracentesis later today.  Unclear yet if she will initiate the intrathecal methotrexate.  Oncology consult pending.  I called and spoke with patient's husband by phone.  He is in route to the hospital.  PLAN: 1.  Continue current scope of treatment 2.  Will discontinue current bowel regimen and start Movantik 5 mg once daily 3.  Pending therapeutic paracentesis 4.  Anticipate eventual discharge home with home health 5.  We will continue conversations regarding goals  Patient expressed understanding and was in agreement with this plan. She also understands that She can call clinic at any time with any questions, concerns, or complaints.    Time Total: 20 minutes  Visit consisted of counseling and education dealing with the complex and emotionally intense issues of symptom management and palliative care in the setting of serious and potentially life-threatening illness.Greater than 50%  of this time was spent counseling and coordinating care related to the above assessment and plan.  Signed by: Altha Harm, PhD, NP-C [phone] (Work Cell)

## 2018-04-20 NOTE — Care Management (Signed)
Patient suffers from chronic pain and spends almost 100 percent of the time in the bed. Patient has problems with dysphagia and is at risk for aspiration. Patient also has chronic, debilitating cancer from metastatic cancer. A hospital bed will assist in alleviating pain by allowing her upper body to be positioned in ways not feasible with a normal bed. The torso being elevated at least 45 degrees will also assist in preventing aspiration. Bed wedges do not provide adequate elevation to resolve issues with aspiration. Choking requires frequent and immediate changes in body position which cannot be achieved with a normal bed. Frequent pain episodes can require frequent body position changes that cannot be achieved with a normal bed.

## 2018-04-20 NOTE — Progress Notes (Addendum)
[facility] [place] NAME: Mary [name]    MR#:  [id number]  DATE OF BIRTH:  1961-02-05  SUBJECTIVE:  CHIEF COMPLAINT:   Chief Complaint  Patient presents with  . Loss of Consciousness  Patient seen and evaluated today No new episodes of loss of consciousness EEG has been normal Tolerating diet okay Has constipation Awake and responds to all verbal commands   REVIEW OF SYSTEMS:    ROS  CONSTITUTIONAL: No documented fever. has fatigue, weakness. No weight gain, no weight loss.  EYES: No blurry or double vision.  ENT: No tinnitus. No postnasal drip. No redness of the oropharynx.  RESPIRATORY: No cough, no wheeze, no hemoptysis. No dyspnea.  CARDIOVASCULAR: No chest pain. No orthopnea. No palpitations. No syncope.  GASTROINTESTINAL: Has nausea, no vomiting or diarrhea. No abdominal pain. No melena or hematochezia.  Has constipation GENITOURINARY: No dysuria or hematuria.  ENDOCRINE: No polyuria or nocturia. No heat or cold intolerance.  HEMATOLOGY: No anemia. No bruising. No bleeding.  INTEGUMENTARY: Light redness of the skin over the neck MUSCULOSKELETAL: No arthritis. No swelling. No gout.  NEUROLOGIC: No numbness, tingling, or ataxia. No seizure-type activity.  Intermittent episodes of loss of consciousness PSYCHIATRIC: No anxiety. No insomnia. No ADD.   DRUG ALLERGIES:   Allergies  Allergen Reactions  . Adhesive [Tape] Other (See Comments)    Redness:Please use paper tape    VITALS:  Blood pressure (!) 152/90, pulse 97, temperature 98.3 F (36.8 C), resp. rate 15, height 5\' 5"  (1.651 m), weight 66.2 kg, last menstrual period 11/28/2011, SpO2 93 %.  PHYSICAL EXAMINATION:   Physical Exam  GENERAL:  58 y.o.-year-old patient lying in the bed with no acute distress.  EYES: Pupils equal, round, reactive to light and accommodation. No scleral icterus. Extraocular muscles intact.  HEENT: Head atraumatic, normocephalic.  Oropharynx and nasopharynx clear.  NECK:  Supple, no jugular venous distention. No thyroid enlargement, no tenderness.  LUNGS: Normal breath sounds bilaterally, no wheezing, rales, rhonchi. No use of accessory muscles of respiration.  CARDIOVASCULAR: S1, S2 normal. No murmurs, rubs, or gallops.  ABDOMEN: Soft, nontender, mildly distended. Bowel sounds present. No organomegaly or mass.  EXTREMITIES: No cyanosis, clubbing or edema b/l.    NEUROLOGIC: Cranial nerves II through XII are intact. No focal Motor or sensory deficits b/l.   PSYCHIATRIC: The patient is alert and oriented x 3.  SKIN:  lesion, or ulcer.  Mild macular rash over the neck area  LABORATORY PANEL:   CBC Recent Labs  Lab 04/18/18 1503  WBC 5.4  HGB 11.1*  HCT 34.3*  PLT 385   ------------------------------------------------------------------------------------------------------------------ Chemistries  Recent Labs  Lab 04/18/18 1503 04/20/18 0518  NA 134* 134*  K 3.6 4.7  CL 99 96*  CO2 26 31  GLUCOSE 138* 169*  BUN 11 17  CREATININE 0.82 0.71  CALCIUM 8.3* 8.4*  AST 72*  --   ALT 40  --   ALKPHOS 175*  --   BILITOT 0.3  --    ------------------------------------------------------------------------------------------------------------------  Cardiac Enzymes Recent Labs  Lab 04/18/18 1503  TROPONINI <0.03   ------------------------------------------------------------------------------------------------------------------  RADIOLOGY:  Ct Head Wo Contrast  Result Date: 04/18/2018 CLINICAL DATA:  Syncopal episode, seizures, VP shunt EXAM: CT HEAD WITHOUT CONTRAST TECHNIQUE: Contiguous axial images were obtained from the base of the skull through the vertex without intravenous contrast. COMPARISON:  03/05/2018 MRI FINDINGS: Brain: Right frontal shunt tubing terminates in right lateral ventricle close to midline. There  is diffuse enlargement of the ventricular system, difficult to exclude mild hydrocephalus  or shunt malfunction. Ventricles remain enlarged and similar to the 03/05/2018 MR. Mild periventricular white matter microvascular ischemic changes. No acute intracranial hemorrhage, mass lesion, definite infarction, midline shift, herniation, extra-axial fluid collection. No focal mass effect or edema. Cisterns are patent. Cerebellar atrophy as well. Vascular: No hyperdense vessel or unexpected calcification. Skull: Normal. Negative for fracture or focal lesion. Sinuses/Orbits: No acute finding. Other: None. IMPRESSION: Interval right frontal shunt insertion. Similar diffuse ventricular enlargement of the entire ventricular system. Difficult to exclude hydrocephalus or shunt malfunction. Minor white matter microvascular changes Stable brain atrophy pattern. No other acute intracranial abnormality or hemorrhage. Electronically Signed   By: Jerilynn Mages.  Shick M.D.   On: 04/18/2018 20:20   US Renal  Result Date: 04/19/2018 CLINICAL DATA:  Nephrostomy status. EXAM: RENAL / URINARY TRACT ULTRASOUND COMPLETE COMPARISON:  None. FINDINGS: Right Kidney: Renal measurements: 11.6 x 5 x 4.3 cm = volume: 129 mL . Echogenicity within normal limits. Mild hydronephrosis is noted. Nephrostomy tube is present. Left Kidney: Renal measurements: 11.3 x 5.3 x 3.9 cm = volume: 121 mL. Echogenicity within normal limits. Moderate hydronephrosis is noted Bladder: Appears normal for degree of bladder distention. No ureteral jets are noted. Ascites are noted in the abdomen pelvis. Bilateral pleural effusions are noted. IMPRESSION: Right nephrostomy tube is identified. There is mild right hydronephrosis. Moderate left hydronephrosis is noted. Ascites identified in the abdomen and pelvis. Bilateral pleural effusions. Electronically Signed   By: Abelardo Diesel M.D.   On: 04/19/2018 11:35   Dg Abd 2 Views  Result Date: 04/18/2018 CLINICAL DATA:  Initial evaluation for constipation. EXAM: ABDOMEN - 2 VIEW COMPARISON:  None. FINDINGS: Paucity of gas  limits evaluation of the bowels. No evidence for obstruction. No free air on upright view. No abnormal bowel wall thickening. Moderate retained stool within the colon, most notable at the splenic flexure. No soft tissue mass or abnormal calcification. Right-sided percutaneous nephrostomy tube in place. No acute osseous abnormality. Right basilar atelectasis noted. Right-sided Port-A-Cath noted as well. IMPRESSION: Nonobstructive bowel gas pattern with moderate retained stool within the colon. Electronically Signed   By: Jeannine Boga M.D.   On: 04/18/2018 17:44     ASSESSMENT AND PLAN:   58 year old female patient with history of metastatic breast cancer with meningeal mets currently had a recent placement of [place] reservoir by neurosurgery.  Due for next chemotherapy on Friday through the reservoir  -Intermittent loss of consciousness Could be subtle seizures Started on oral Keppra Will be continued for 1 month Neurology follow-up EEG normal CT head reviewed  -Ascites Probably from metastatic cancer Therapeutic paracentesis ultrasound-guided today Patient's family states patient is allergic to albumin infusion  -Chronic anxiety disorder Continue anxiolytics  -History of nephrostomy tube secondary to right-sided hydronephrosis Renal ultrasound shows nephrostomy tube intact  -Metastatic breast cancer Oncology follow-up today Status post palliative care evaluation  -Chronic pain Pain management with oral as well as as needed Dilaudid IV  -DVT prophylaxis Continue oral Eliquis  -Pleural effusions and fluid overload Continue Lasix for diuresis  All the records are reviewed and case discussed with Care Management/Social Worker. Management plans discussed with the patient, family and they are in agreement.  CODE STATUS: Full code  DVT Prophylaxis: SCDs  TOTAL TIME TAKING CARE OF THIS PATIENT: 36 minutes.   POSSIBLE D/C IN 2 to 3 DAYS, DEPENDING ON CLINICAL  CONDITION.  Saundra Shelling M.D on 04/20/2018 at 10:15 AM  Between 7am to 6pm - Pager - [phone]  After 6pm go to [email or web address] - password [email or web address] [facility]  Office  [phone]  CC: Primary care physician; [name], MD  Note: This dictation was prepared with Dragon dictation along with smaller phrase technology. Any transcriptional errors that result from this process are unintentional.

## 2018-04-21 LAB — BASIC METABOLIC PANEL
ANION GAP: 8 (ref 5–15)
BUN: 18 mg/dL (ref 6–20)
CO2: 30 mmol/L (ref 22–32)
Calcium: 8.4 mg/dL — ABNORMAL LOW (ref 8.9–10.3)
Chloride: 96 mmol/L — ABNORMAL LOW (ref 98–111)
Creatinine, Ser: 0.81 mg/dL (ref 0.44–1.00)
GFR calc Af Amer: >60 mL/min (ref >60)
GFR calc non Af Amer: >60 mL/min (ref >60)
Glucose, Bld: 150 mg/dL — ABNORMAL HIGH (ref 70–99)
Potassium: 4.6 mmol/L (ref 3.5–5.1)
Sodium: 134 mmol/L — ABNORMAL LOW (ref 135–145)

## 2018-04-21 LAB — CBC
HCT: 32.6 % — ABNORMAL LOW (ref 36.0–46.0)
HEMOGLOBIN: 10.7 g/dL — AB (ref 12.0–15.0)
MCH: 29.9 pg (ref 26.0–34.0)
MCHC: 32.8 g/dL (ref 30.0–36.0)
MCV: 91.1 fL (ref 80.0–100.0)
Platelets: 425 10*3/uL — ABNORMAL HIGH (ref 150–400)
RBC: 3.58 MIL/uL — ABNORMAL LOW (ref 3.87–5.11)
RDW: 17.2 % — ABNORMAL HIGH (ref 11.5–15.5)
WBC: 7.1 10*3/uL (ref 4.0–10.5)
nRBC: 0 % (ref 0.0–0.2)

## 2018-04-21 MED ORDER — OXYBUTYNIN CHLORIDE 5 MG PO TABS: 2.5000 mg | ORAL_TABLET | Freq: Four times a day (QID) | ORAL | Status: DC | PRN

## 2018-04-21 MED ORDER — POLYVINYL ALCOHOL 1.4 % OP SOLN
1.0000 [drp] | Freq: Four times a day (QID) | OPHTHALMIC | Status: DC | PRN
  Filled 2018-04-21: qty 15

## 2018-04-21 MED ORDER — TRAZODONE HCL 50 MG PO TABS: 25.0000 mg | ORAL_TABLET | Freq: Every evening | ORAL | Status: DC | PRN

## 2018-04-21 MED ORDER — ONDANSETRON HCL 4 MG/2ML IJ SOLN
4.0000 mg | Freq: Four times a day (QID) | INTRAMUSCULAR | Status: DC | PRN
  Administered 2018-04-26: 21:00:00 4 mg via INTRAVENOUS
  Filled 2018-04-21: qty 2

## 2018-04-21 MED ORDER — SODIUM CHLORIDE 0.9% FLUSH
3.0000 mL | INTRAVENOUS | Status: DC | PRN
  Administered 2018-04-23: 3 mL via INTRAVENOUS
  Filled 2018-04-21: qty 3

## 2018-04-21 MED ORDER — HALOPERIDOL 0.5 MG PO TABS
0.5000 mg | ORAL_TABLET | ORAL | Status: DC | PRN
  Filled 2018-04-21: qty 1

## 2018-04-21 MED ORDER — SODIUM CHLORIDE 0.9 % IV SOLN: 250.0000 mL | INTRAVENOUS | Status: DC | PRN

## 2018-04-21 MED ORDER — HALOPERIDOL LACTATE 2 MG/ML PO CONC
0.5000 mg | ORAL | Status: DC | PRN
  Administered 2018-04-26: 17:00:00 0.5 mg via SUBLINGUAL
  Filled 2018-04-21 (×2): qty 0.3

## 2018-04-21 MED ORDER — HALOPERIDOL LACTATE 5 MG/ML IJ SOLN: 0.5000 mg | INTRAMUSCULAR | Status: DC | PRN

## 2018-04-21 MED ORDER — SODIUM CHLORIDE 0.9% FLUSH
3.0000 mL | Freq: Two times a day (BID) | INTRAVENOUS | Status: DC
  Administered 2018-04-21 – 2018-04-27 (×12): 3 mL via INTRAVENOUS

## 2018-04-21 MED ORDER — LORAZEPAM 2 MG/ML IJ SOLN
1.0000 mg | INTRAMUSCULAR | Status: DC | PRN
  Administered 2018-04-21 – 2018-04-24 (×6): 1 mg via INTRAVENOUS
  Filled 2018-04-21 (×6): qty 1

## 2018-04-21 MED ORDER — ONDANSETRON 4 MG PO TBDP
4.0000 mg | ORAL_TABLET | Freq: Four times a day (QID) | ORAL | Status: DC | PRN
  Filled 2018-04-21: qty 1

## 2018-04-21 MED ORDER — BIOTENE DRY MOUTH MT LIQD: 15.0000 mL | OROMUCOSAL | Status: DC | PRN

## 2018-04-21 MED ORDER — MORPHINE SULFATE (CONCENTRATE) 10 MG/0.5ML PO SOLN
5.0000 mg | ORAL | Status: DC | PRN
  Administered 2018-04-22 – 2018-04-24 (×4): 5 mg via ORAL
  Filled 2018-04-21 (×3): qty 1

## 2018-04-21 MED ORDER — NYSTATIN 100000 UNIT/GM EX POWD
Freq: Three times a day (TID) | CUTANEOUS | Status: DC | PRN
  Filled 2018-04-21: qty 15

## 2018-04-21 MED ORDER — LORAZEPAM 2 MG/ML PO CONC: 1.0000 mg | ORAL | Status: DC | PRN

## 2018-04-21 MED ORDER — LORAZEPAM 1 MG PO TABS
1.0000 mg | ORAL_TABLET | ORAL | Status: DC | PRN
  Administered 2018-04-22 – 2018-04-23 (×4): 1 mg via ORAL
  Filled 2018-04-21 (×4): qty 1

## 2018-04-21 MED ORDER — SODIUM CHLORIDE 0.9 % IV SOLN
12.5000 mg | Freq: Four times a day (QID) | INTRAVENOUS | Status: DC | PRN
  Filled 2018-04-21: qty 0.5

## 2018-04-21 MED ORDER — MAGIC MOUTHWASH: 15.0000 mL | Freq: Four times a day (QID) | ORAL | Status: DC | PRN

## 2018-04-21 MED ORDER — HYDROMORPHONE HCL 1 MG/ML IJ SOLN
0.5000 mg | INTRAMUSCULAR | Status: DC | PRN
  Administered 2018-04-21 – 2018-04-27 (×29): 0.5 mg via INTRAVENOUS
  Filled 2018-04-21 (×29): qty 1

## 2018-04-21 MED ORDER — MORPHINE SULFATE (CONCENTRATE) 10 MG/0.5ML PO SOLN
5.0000 mg | ORAL | Status: DC | PRN
  Filled 2018-04-21: qty 1

## 2018-04-21 NOTE — Progress Notes (Signed)
   04/21/18 1200  Clinical Encounter Type  Visited With Family  Visit Type Follow-up;Spiritual support;Critical Care  Referral From Nurse  Consult/Referral To Chaplain  Chaplain received OR for end of life major transition. Chaplain arrived and patient was resting. Chaplain maintain pastoral presence and offered prayer and comfort to patient parents, they were so appreciative. Chaplain ask the parents was there anything else she could do for them they said continue to pray she is in God's hand. Chaplain agreed and will follow up later.

## 2018-04-21 NOTE — Progress Notes (Signed)
Follow up visit made. Patient resting comfortably. She had a BM today. Pain seems reasonably well controlled with prn use of hydromorphone. Sitter says that patient had another event of unresponsiveness today.  I met with patient's husband. He confirms that he met with hospitalist this morning and made decision to pursue comfort care and DNR. He would like to take her home on Monday with hospice.   Emotional support provided.

## 2018-04-21 NOTE — Progress Notes (Addendum)
[place] at [place] NAME: Mary [name]    MR#:  [id number]  DATE OF BIRTH:  1960-08-10  SUBJECTIVE:  Case discussed with neurology, oncology input appreciated, long discussion had with the patient and the patient's husband with caregivers present-they now wish for DNR/comfort care measures going forward plans for home hospice on Monday once equipment has arrived  REVIEW OF SYSTEMS:    ROS  CONSTITUTIONAL: No documented fever. has fatigue, weakness. No weight gain, no weight loss.  EYES: No blurry or double vision.  ENT: No tinnitus. No postnasal drip. No redness of the oropharynx.  RESPIRATORY: No cough, no wheeze, no hemoptysis. No dyspnea.  CARDIOVASCULAR: No chest pain. No orthopnea. No palpitations. No syncope.  GASTROINTESTINAL: Has nausea, no vomiting or diarrhea. No abdominal pain. No melena or hematochezia.  Has constipation GENITOURINARY: No dysuria or hematuria.  ENDOCRINE: No polyuria or nocturia. No heat or cold intolerance.  HEMATOLOGY: No anemia. No bruising. No bleeding.  INTEGUMENTARY: Light redness of the skin over the neck MUSCULOSKELETAL: No arthritis. No swelling. No gout.  NEUROLOGIC: No numbness, tingling, or ataxia. No seizure-type activity.  Intermittent episodes of loss of consciousness PSYCHIATRIC: No anxiety. No insomnia. No ADD.   DRUG ALLERGIES:   Allergies  Allergen Reactions  . Adhesive [Tape] Other (See Comments)    Redness:Please use paper tape  . Albumin (Human) Swelling and Rash    VITALS:  Blood pressure (!) 146/85, pulse 88, temperature 98.1 F (36.7 C), temperature source Oral, resp. rate 14, height 5\' 5"  (1.651 m), weight 66.2 kg, last menstrual period 11/28/2011, SpO2 93 %.  PHYSICAL EXAMINATION:   Physical Exam  GENERAL:  58 y.o.-year-old patient lying in the bed with no acute distress.  EYES: Pupils equal, round, reactive to light and accommodation. No scleral icterus. Extraocular  muscles intact.  HEENT: Head atraumatic, normocephalic. Oropharynx and nasopharynx clear.  NECK:  Supple, no jugular venous distention. No thyroid enlargement, no tenderness.  LUNGS: Normal breath sounds bilaterally, no wheezing, rales, rhonchi. No use of accessory muscles of respiration.  CARDIOVASCULAR: S1, S2 normal. No murmurs, rubs, or gallops.  ABDOMEN: Soft, nontender, mildly distended. Bowel sounds present. No organomegaly or mass.  EXTREMITIES: No cyanosis, clubbing or edema b/l.    NEUROLOGIC: Cranial nerves II through XII are intact. No focal Motor or sensory deficits b/l.   PSYCHIATRIC: The patient is alert and oriented x 3.  SKIN:  lesion, or ulcer.  Mild macular rash over the neck area  LABORATORY PANEL:   CBC Recent Labs  Lab 04/21/18 0543  WBC 7.1  HGB 10.7*  HCT 32.6*  PLT 425*   ------------------------------------------------------------------------------------------------------------------ Chemistries  Recent Labs  Lab 04/18/18 1503  04/21/18 0543  NA 134*   < > 134*  K 3.6   < > 4.6  CL 99   < > 96*  CO2 26   < > 30  GLUCOSE 138*   < > 150*  BUN 11   < > 18  CREATININE 0.82   < > 0.81  CALCIUM 8.3*   < > 8.4*  AST 72*  --   --   ALT 40  --   --   ALKPHOS 175*  --   --   BILITOT 0.3  --   --    < > = values in this interval not displayed.   ------------------------------------------------------------------------------------------------------------------  Cardiac Enzymes Recent Labs  Lab 04/18/18 1503  TROPONINI <0.03   ------------------------------------------------------------------------------------------------------------------  RADIOLOGY:  US Renal  Result Date: 04/19/2018 CLINICAL DATA:  Nephrostomy status. EXAM: RENAL / URINARY TRACT ULTRASOUND COMPLETE COMPARISON:  None. FINDINGS: Right Kidney: Renal measurements: 11.6 x 5 x 4.3 cm = volume: 129 mL . Echogenicity within normal limits. Mild hydronephrosis is noted. Nephrostomy tube is  present. Left Kidney: Renal measurements: 11.3 x 5.3 x 3.9 cm = volume: 121 mL. Echogenicity within normal limits. Moderate hydronephrosis is noted Bladder: Appears normal for degree of bladder distention. No ureteral jets are noted. Ascites are noted in the abdomen pelvis. Bilateral pleural effusions are noted. IMPRESSION: Right nephrostomy tube is identified. There is mild right hydronephrosis. Moderate left hydronephrosis is noted. Ascites identified in the abdomen and pelvis. Bilateral pleural effusions. Electronically Signed   By: Abelardo Diesel M.D.   On: 04/19/2018 11:35   US Paracentesis  Result Date: 04/20/2018 INDICATION: Patient with history of metastatic breast carcinoma with recurrent malignant ascites. Request made for therapeutic paracentesis. EXAM: ULTRASOUND GUIDED THERAPEUTIC PARACENTESIS MEDICATIONS: None COMPLICATIONS: None immediate. PROCEDURE: Informed written consent was obtained from the patient after a discussion of the risks, benefits and alternatives to treatment. A timeout was performed prior to the initiation of the procedure. Initial ultrasound scanning demonstrates a moderate to large amount of ascites within the left lower abdominal quadrant. The left lower abdomen was prepped and draped in the usual sterile fashion. 1% lidocaine was used for local anesthesia. Following this, a 6 Fr Safe-T-Centesis catheter was introduced. An ultrasound image was saved for documentation purposes. The paracentesis was performed. The catheter was removed and a dressing was applied. The patient tolerated the procedure well without immediate post procedural complication. FINDINGS: A total of approximately 3.2 liters of slightly hazy, yellow fluid was removed. IMPRESSION: Successful ultrasound-guided therapeutic paracentesis yielding 3.2 liters of peritoneal fluid. Read by: Rowe Robert, PA-C Electronically Signed   By: Jerilynn Mages.  Shick M.D.   On: 04/20/2018 10:58     ASSESSMENT AND PLAN:  58 year old  female patient with history of metastatic breast cancer with meningeal mets currently had a recent placement of Ommaya reservoir by neurosurgery.  Due for next chemotherapy on Friday through the reservoir  *Intermittent loss of consciousness Improved Could be subtle seizures Discussed with neurology-continue Keppra for 1 month, EEG was normal, CT head negative  In discussion with the patient and the patient's husband with caregivers present-patient made herself DNR/comfort care going forward given terminal prognosis   *Acute malignant ascites Related breast cancer Status post paracentesis with 3.2 L of fluid drained off April 20, 2018 Plan of care as stated above  *Chronic anxiety disorder Stable on current regiment   *History of nephrostomy tube secondary to right-sided hydronephrosis Renal ultrasound shows nephrostomy tube intact Plan of care as stated above  *Chronic metastatic breast cancer Oncology input appreciated Plan of care as stated above  *Acute on chronic cancer pain syndrome Plan of care as stated above  *DVT prophylaxis Continue oral Eliquis  *Pleural effusions and fluid overload Stable Continue Lasix Plan of care as stated above  DNR/comfort care going forward with expectant management, plans for home with hospice on Monday once equipment in place  All the records are reviewed and case discussed with Care Management/Social Worker. Management plans discussed with the patient, family and they are in agreement.  CODE STATUS: DNR DVT Prophylaxis: SCDs  TOTAL TIME TAKING CARE OF THIS PATIENT: 36 minutes.   POSSIBLE D/C IN 2 to 3 DAYS, DEPENDING ON CLINICAL CONDITION.  Avel Peace  M.D on 04/21/2018 at 10:28 AM  Between 7am to 6pm - Pager - [phone]  After 6pm go to [email or web address] - password [email or web address] [facility]  Office  [phone]  CC: Primary care physician; [name], MD  Note: This dictation was prepared with  Dragon dictation along with smaller phrase technology. Any transcriptional errors that result from this process are unintentional.

## 2018-04-21 NOTE — Progress Notes (Signed)
Family Meeting Note  Advance Directive:yes  Today a meeting took place with the Patient and spouse.  Patient is able to participate  The following clinical team members were present during this meeting:MD  The following were discussed:Patient's diagnosis: Metastatic breast cancer, DNR/comfort care, Patient's progosis: Unable to determine and Goals for treatment: DNR  Additional follow-up to be provided: prn  Time spent during discussion:20 minutes  Gorden Harms, MD

## 2018-04-21 NOTE — Plan of Care (Signed)

## 2018-04-22 MED ORDER — DIPHENHYDRAMINE HCL 50 MG/ML IJ SOLN
25.0000 mg | Freq: Four times a day (QID) | INTRAMUSCULAR | Status: DC | PRN
  Administered 2018-04-22: 25 mg via INTRAVENOUS
  Filled 2018-04-22: qty 0.5
  Filled 2018-04-22: qty 1

## 2018-04-22 MED ORDER — TRIAMCINOLONE ACETONIDE 0.1 % EX CREA
TOPICAL_CREAM | Freq: Two times a day (BID) | CUTANEOUS | Status: DC | PRN
  Filled 2018-04-22 (×2): qty 15

## 2018-04-22 NOTE — Progress Notes (Addendum)
[place] at [place] NAME: Mary [name]    MR#:  [id number]  DATE OF BIRTH:  08/11/1960  SUBJECTIVE:  Case discussed with neurology, oncology input appreciated, long discussion had with the patient and the patient's husband with caregivers present-they continue to want DNR/comfort care measures going forward - plans for home hospice on later this week once equipment has arrived, mild rash noted as well as itching  REVIEW OF SYSTEMS:    ROS  CONSTITUTIONAL: No documented fever. has fatigue, weakness. No weight gain, no weight loss.  EYES: No blurry or double vision.  ENT: No tinnitus. No postnasal drip. No redness of the oropharynx.  RESPIRATORY: No cough, no wheeze, no hemoptysis. No dyspnea.  CARDIOVASCULAR: No chest pain. No orthopnea. No palpitations. No syncope.  GASTROINTESTINAL: Has nausea, no vomiting or diarrhea. No abdominal pain. No melena or hematochezia.  Has constipation GENITOURINARY: No dysuria or hematuria.  ENDOCRINE: No polyuria or nocturia. No heat or cold intolerance.  HEMATOLOGY: No anemia. No bruising. No bleeding.  INTEGUMENTARY: Light redness of the skin over the neck MUSCULOSKELETAL: No arthritis. No swelling. No gout.  NEUROLOGIC: No numbness, tingling, or ataxia. No seizure-type activity.  Intermittent episodes of loss of consciousness PSYCHIATRIC: No anxiety. No insomnia. No ADD.   DRUG ALLERGIES:   Allergies  Allergen Reactions  . Adhesive [Tape] Other (See Comments)    Redness:Please use paper tape  . Albumin (Human) Swelling and Rash    VITALS:  Blood pressure (!) 146/83, pulse 88, temperature 98.6 F (37 C), temperature source Oral, resp. rate 18, height 5\' 5"  (1.651 m), weight 58.2 kg, last menstrual period 11/28/2011, SpO2 95 %.  PHYSICAL EXAMINATION:   Physical Exam  GENERAL:  58 y.o.-year-old patient lying in the bed with no acute distress.  EYES: Pupils equal, round, reactive to light and  accommodation. No scleral icterus. Extraocular muscles intact.  HEENT: Head atraumatic, normocephalic. Oropharynx and nasopharynx clear.  NECK:  Supple, no jugular venous distention. No thyroid enlargement, no tenderness.  LUNGS: Normal breath sounds bilaterally, no wheezing, rales, rhonchi. No use of accessory muscles of respiration.  CARDIOVASCULAR: S1, S2 normal. No murmurs, rubs, or gallops.  ABDOMEN: Soft, nontender, mildly distended. Bowel sounds present. No organomegaly or mass.  EXTREMITIES: No cyanosis, clubbing or edema b/l.    NEUROLOGIC: Cranial nerves II through XII are intact. No focal Motor or sensory deficits b/l.   PSYCHIATRIC: The patient is alert and oriented x 3.  SKIN:  lesion, or ulcer.  Mild macular rash over the neck area  LABORATORY PANEL:   CBC Recent Labs  Lab 04/21/18 0543  WBC 7.1  HGB 10.7*  HCT 32.6*  PLT 425*   ------------------------------------------------------------------------------------------------------------------ Chemistries  Recent Labs  Lab 04/18/18 1503  04/21/18 0543  NA 134*   < > 134*  K 3.6   < > 4.6  CL 99   < > 96*  CO2 26   < > 30  GLUCOSE 138*   < > 150*  BUN 11   < > 18  CREATININE 0.82   < > 0.81  CALCIUM 8.3*   < > 8.4*  AST 72*  --   --   ALT 40  --   --   ALKPHOS 175*  --   --   BILITOT 0.3  --   --    < > = values in this interval not displayed.   ------------------------------------------------------------------------------------------------------------------  Cardiac Enzymes Recent Labs  Lab  04/18/18 1503  TROPONINI <0.03   ------------------------------------------------------------------------------------------------------------------  RADIOLOGY:  No results found.   ASSESSMENT AND PLAN:  58 year old female patient with history of metastatic breast cancer with meningeal mets currently had a recent placement of Ommaya reservoir by neurosurgery.  Due for next chemotherapy on Friday through the  reservoir  *Intermittent loss of consciousness Resolved Could be subtle seizures Discussed with neurology-continue Keppra for 1 month, EEG was normal, CT head negative  In discussion with the patient and the patient's husband with caregivers present-patient made herself DNR/comfort care going forward given terminal prognosis -continue expectant management with comfort care measures  *Acute malignant ascites Related breast cancer Status post paracentesis with 3.2 L of fluid drained off April 20, 2018 Plan of care as stated above  *Chronic anxiety disorder Plan of care as stated above  *History of nephrostomy tube secondary to right-sided hydronephrosis Renal ultrasound shows nephrostomy tube intact Plan of care as stated above  *Chronic metastatic breast cancer Oncology input appreciated Plan of care as stated above  *Acute on chronic cancer pain syndrome Plan of care as stated above  *DVT prophylaxis Continue oral Eliquis  *Pleural effusions and fluid overload Stable Continue Lasix Plan of care as stated above  DNR/comfort care going forward with expectant management, plans for home with hospice once equipment in place  All the records are reviewed and case discussed with Care Management/Social Worker. Management plans discussed with the patient, family and they are in agreement.  CODE STATUS: DNR DVT Prophylaxis: SCDs  TOTAL TIME TAKING CARE OF THIS PATIENT: 36 minutes.   POSSIBLE D/C IN 2 to 3 DAYS, DEPENDING ON CLINICAL CONDITION.  Avel Peace Eavan Gonterman M.D on 04/22/2018 at 11:33 AM  Between 7am to 6pm - Pager - [phone]  After 6pm go to [email or web address] - password [email or web address]  Office  [phone]  CC: Primary care physician; [name], MD  Note: This dictation was prepared with Dragon dictation along with smaller phrase technology. Any transcriptional errors that result from this process are unintentional.

## 2018-04-22 NOTE — Care Management (Signed)
After much deliberation and palliative meetings family has opted for comfort measures and discharging with hospice care. Previous disposition was home with private care givers and a DME hospital bed. Family received "devastating" news regarding her prognosis and now just want her to be comfortable and be at home. Multiple family members at bedside and not yet ready to talk about agency preference, their main concern will be a DME hospitall bed. Patient has walker and bedside commode from last admission. I have told family RNCM team will follow up tomorrow to assist with discharge.

## 2018-04-23 ENCOUNTER — Telehealth: Payer: Self-pay | Admitting: Urology

## 2018-04-23 ENCOUNTER — Other Ambulatory Visit: Payer: Self-pay | Admitting: Hospice and Palliative Medicine

## 2018-04-23 DIAGNOSIS — C50919 Malignant neoplasm of unspecified site of unspecified female breast: Secondary | ICD-10-CM

## 2018-04-23 DIAGNOSIS — Z87891 Personal history of nicotine dependence: Secondary | ICD-10-CM

## 2018-04-23 DIAGNOSIS — G96198 Other disorders of meninges, not elsewhere classified: Secondary | ICD-10-CM

## 2018-04-23 DIAGNOSIS — G039 Meningitis, unspecified: Secondary | ICD-10-CM

## 2018-04-23 DIAGNOSIS — C799 Secondary malignant neoplasm of unspecified site: Secondary | ICD-10-CM

## 2018-04-23 NOTE — Care Management Note (Signed)
Case Management Note  Patient Details  Name: VERONIA LAPRISE MRN: [id number] Date of Birth: 1961/03/05  Subjective/Objective:                    Action/Plan:  Met with Patient and spouse and provided the Hospice list per CMS.gov Notified [facility] that the hospital bed would come from Hospice and not needed from [facility] [place] of the choice  Expected Discharge Date:  04/19/18               Expected Discharge Plan:  [place]  In-House Referral:     Discharge planning Services  CM Consult  Post Acute Care Choice:  Durable Medical Equipment Choice offered to:  Patient  DME Arranged:  Hospital bed DME Agency:  [place]:    [place]:     Status of Service:  Completed, signed off  If discussed at [place] of Stay Meetings, dates discussed:    Additional Comments:  Su Hilt, RN 04/23/2018, 9:39 AM

## 2018-04-23 NOTE — Progress Notes (Signed)
New referral for [facility] services at home received from [facility]. Patient is a 58 year old woman with a known history of stage IV breast cancer with metastatic disease to bone, bladder wall, and peritoneum. She was admitted to [facility] on 1/29 for evaluation of transient periods of unresponsiveness. Palliative medicine was consulted for goals of care and have met with patient and family over the course of her hospital stay. Family has chosen at this time to focus on comfort with the support of hospice services at home.  Writer met in the room with patient's husband Mary [name], her parents Mary [name] and Mary [name] and sister Mary [name] to initiate education regarding hospice services, philosophy and team approach to care with understanding voiced. Hospital bed ordered.  Hospice information and contact numbers given to John. Patient remained with eyes closed through out the visit and did not participate in the discussion. Patient information faxed to referral. Hospital care team updated. Patient will require EMS transport home with signed out of facility DNR in place. Thank you for the opportunity to be involved in the care of this patient and her family. Flo Shanks RN, BSN, [place] [facility] [place], hospital Liaison [phone]

## 2018-04-23 NOTE — Progress Notes (Addendum)
[place]  [phone] [phone]   Name: Mary [name] Date: 04/23/2018 MRN: [id number]  DOB: [birth year]  Patient Care Team: [name], MD as PCP - General (Internal Medicine) Barrington Ellison, RN as [place] [facility]    REASON FOR CONSULTATION: Palliative Care consult requested for this 58 y.o. female with multiple medical problems including stage IV breast cancer (initially diagnosed in August 2013) status post neoadjuvant chemotherapy and bilateral mastectomy and adjuvant RT.  Patient was found to have disease with recurrence in September 2019 when she developed obstructive nephropathy requiring nephrostomy tubes.  Patient was found to have metastatic disease to bone, bladder wall, and peritoneum.  She was started on Taxol treatment.  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  She is status post Ommaya reservoir (placed on 04/13/2018) with plan to receive intrathecal methotrexate.  PMH also notable for CKD stage II, history of DVT (11/2017) on Eliquis, ascites requiring paracentesis, IBS, anxiety, and depression.  Patient was recently hospitalized 04/07/19/18/20 with lysed weakness and acute kidney injury secondary to dehydration.  Patient's performance status has rapidly declined and she is now bedbound.  She has had transient periods of unresponsiveness and was admitted to the hospital on 04/18/2018 for evaluation.  Palliative care was consulted to help address goals.  CODE STATUS: Full code  PAST MEDICAL HISTORY: Past Medical History:  Diagnosis Date  . Anxiety    takes Xanax prn  . Breast cancer [facility]) August 2013   bilateral, er/pr+, bladder (02/2018..mets from breast ca)  . Chronic kidney disease 01/2018   recent shut down of kidneys with labs out of whack  . Complication of anesthesia    pt  states she had the shakes extremely bad  . Cough    at night d/t reflux;nothing productive  . Dental crowns present    x 4  . Depression   . GERD (gastroesophageal reflux disease)    takes Nexium daily  . History of bronchitis    08/2011  . History of colon polyps   . Hot flashes, menopausal 08/08/2012  . IBS (irritable bowel syndrome)    immodium prn  . Insomnia    takes Ambien nightly  . Irritable bowel syndrome   . 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 Neutropenia, drug-induced ([place]) 12/15/2011  . Nocturia   . Plantar fasciitis   . S/P radiation therapy 07/03/2012 through 08/18/2038                                                        Right chest wall/regional lymph nodes 5040 cGy 28 sessions, right chest wall/mastectomy scar boost 1000 cGy 5 sessions                          . Status post chemotherapy    FEC 100 starting on 12/08/2011 - 01/18/12/single agent Taxol x12 weeks from 02/02/2012 through January 2014   . Urinary frequency   . Use of tamoxifen (Nolvadex)    now stopped. will be starting ibrance on 03/05/18  . Vertigo     PAST SURGICAL HISTORY:  Past Surgical History:  Procedure Laterality Date  . BREAST LUMPECTOMY  2008  left  . COLONOSCOPY    . COLONOSCOPY WITH PROPOFOL N/A 10/10/2017   Procedure: COLONOSCOPY WITH PROPOFOL;  Surgeon: Lucilla Lame, MD;  Location: [facility] ENDOSCOPY;  Service: Endoscopy;  Laterality: N/A;  . CYSTOSCOPY WITH STENT PLACEMENT Bilateral 12/17/2017   Procedure: CYSTOSCOPY WITH STENT PLACEMENT;  Surgeon: Hollice Espy, MD;  Location: [facility] ORS;  Service: Urology;  Laterality: Bilateral;  . IR NEPHROSTOMY EXCHANGE LEFT  02/13/2018  . IR NEPHROSTOMY EXCHANGE RIGHT  01/02/2018  . IR NEPHROSTOMY EXCHANGE RIGHT  02/13/2018  . IR NEPHROSTOMY PLACEMENT LEFT  12/19/2017  . IR NEPHROSTOMY PLACEMENT RIGHT  12/19/2017  . IR NEPHROSTOMY PLACEMENT RIGHT  04/06/2018  . LUMBAR PUNCTURE  03/01/2018  . MASTECTOMY Bilateral 2013  . MASTECTOMY WITH  AXILLARY LYMPH NODE DISSECTION Right 05/18/2012   Procedure: MASTECTOMY WITH AXILLARY LYMPH NODE DISSECTION;  Surgeon: Adin Hector, MD;  Location: [place];  Service: General;  Laterality: Right;  Bilateral Total Mastectomy , right axillary lymph node dissection left axillary sentinel node biopsy, removal of Porta Cath  . NEPHROSTOMY TUBE REMOVAL Bilateral 03/01/2018   removed tubes in dr. Erlene Quan office  . PORT-A-CATH REMOVAL N/A 05/18/2012   Procedure: REMOVAL PORT-A-CATH;  Surgeon: Adin Hector, MD;  Location: [place];  Service: General;  Laterality: N/A;  . PORTA CATH INSERTION N/A 12/26/2017   Procedure: PORTA CATH INSERTION;  Surgeon: Katha Cabal, MD;  Location: [place] CV [facility];  Service: Cardiovascular;  Laterality: N/A;  . PORTACATH PLACEMENT  12/02/2011   Procedure: INSERTION PORT-A-CATH;  Surgeon: Adin Hector, MD;  Location: [place];  Service: General;  Laterality: N/A;  insertion port a cath with fluoroscopy  . SIMPLE MASTECTOMY WITH AXILLARY SENTINEL NODE BIOPSY Left 05/18/2012   Procedure: SIMPLE MASTECTOMY WITH AXILLARY SENTINEL NODE BIOPSY;  Surgeon: Adin Hector, MD;  Location: [place];  Service: General;  Laterality: Left;    HEMATOLOGY/ONCOLOGY HISTORY:    Primary cancer of upper outer quadrant of left female breast ([place])   11/17/2011 Initial Diagnosis    Cancer of upper-outer quadrant of female breast diagnosed when pt presented with Nipple flattening and palpable lesion.    12/08/2011 - 03/29/2012 Chemotherapy    Neo adjuvant FEC X 4 followed by Weekly Taxol X 12    05/18/2012 Surgery    Bilateral Mastectomies: Right revealed invasive  Lobular carcinoma measuring 2.3 cm 8 of 17 lymph nodes were positive (T2 N2A). 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    07/04/2012 - 08/17/2012 Radiation Therapy    Rt Breast irradiation    08/19/2012 -  Anti-estrogen oral therapy    Initially Tamoxifen + Zolodex  and changed to Aromasin + Zolodex Monthly since 08/2013    12/22/2017 -  Chemotherapy    The patient had PACLitaxel (TAXOL) 144 mg in sodium chloride 0.9 % 250 mL chemo infusion (</= '80mg'$ /m2), 80 mg/m2 = 144 mg, Intravenous,  Once, 3 of 4 cycles Administration: 144 mg (12/22/2017), 144 mg (12/29/2017), 144 mg (01/05/2018), 144 mg (01/19/2018), 144 mg (01/26/2018), 144 mg (02/02/2018), 144 mg (04/05/2018), 144 mg (04/17/2018)  for chemotherapy treatment.     04/20/2018 -  Chemotherapy    The patient had methotrexate (PF) 12 mg in sodium chloride 0.9 % INTRATHECAL chemo injection, , Intrathecal,  Once, 0 of 6 cycles  for chemotherapy treatment.      Metastatic breast cancer ([place])   12/19/2017 Initial Diagnosis    Metastatic breast cancer ([place])  12/22/2017 -  Chemotherapy    The patient had PACLitaxel (TAXOL) 144 mg in sodium chloride 0.9 % 250 mL chemo infusion (</= 80mg /m2), 80 mg/m2 = 144 mg, Intravenous,  Once, 3 of 4 cycles Administration: 144 mg (12/22/2017), 144 mg (12/29/2017), 144 mg (01/05/2018), 144 mg (01/19/2018), 144 mg (01/26/2018), 144 mg (02/02/2018), 144 mg (04/05/2018), 144 mg (04/17/2018)  for chemotherapy treatment.     04/20/2018 -  Chemotherapy    The patient had methotrexate (PF) 12 mg in sodium chloride 0.9 % INTRATHECAL chemo injection, , Intrathecal,  Once, 0 of 6 cycles  for chemotherapy treatment.      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      ALLERGIES:  is allergic to adhesive [tape] and albumin (human).  MEDICATIONS:  Current Facility-Administered Medications  Medication Dose Route Frequency Provider Last Rate Last Dose  . 0.9 %  sodium chloride infusion  250 mL Intravenous PRN Salary, Montell D, MD      . acetaminophen (TYLENOL) tablet 650 mg  650 mg Oral Q6H PRN Fritzi Mandes, MD       Or  . acetaminophen (TYLENOL) suppository 650 mg  650 mg Rectal Q6H PRN Fritzi Mandes, MD      . ALPRAZolam Duanne Moron) tablet 0.25 mg  0.25 mg Oral BID PRN Fritzi Mandes, MD   0.25 mg at 04/23/18 0254  . antiseptic oral rinse (BIOTENE) solution 15 mL  15 mL Topical PRN Salary, Montell D, MD      . apixaban (ELIQUIS) tablet 2.5 mg  2.5 mg Oral BID Fritzi Mandes, MD   2.5 mg at 04/23/18 0909  . chlorproMAZINE (THORAZINE) 12.5 mg in sodium chloride 0.9 % 25 mL IVPB  12.5 mg Intravenous Q6H PRN Salary, Montell D, MD      . dexamethasone (DECADRON) tablet 4 mg  4 mg Oral Q12H , Kirt Boys, NP   4 mg at 04/23/18 0909  . diazepam (VALIUM) tablet 10 mg  10 mg Oral QHS PRN Fritzi Mandes, MD   10 mg at 04/22/18 2209  . diphenhydrAMINE (BENADRYL) injection 25 mg  25 mg Intravenous Q6H PRN Loney Hering D, MD   25 mg at 04/22/18 2209  . dronabinol (MARINOL) capsule 5 mg  5 mg Oral BID AC Fritzi Mandes, MD   5 mg at 04/23/18 1145  . furosemide (LASIX) injection 40 mg  40 mg Intravenous Daily Pyreddy, Reatha Harps, MD   40 mg at 04/23/18 0910  . haloperidol (HALDOL) tablet 0.5 mg  0.5 mg Oral Q4H PRN Salary, Montell D, MD       Or  . haloperidol (HALDOL) 2 MG/ML solution 0.5 mg  0.5 mg Sublingual Q4H PRN Salary, Montell D, MD       Or  . haloperidol lactate (HALDOL) injection 0.5 mg  0.5 mg Intravenous Q4H PRN  Salary, Montell D, MD      . HYDROmorphone (DILAUDID) injection 0.5 mg  0.5 mg Intravenous Q2H PRN Salary, Montell D, MD   0.5 mg at 04/23/18 0917  .  levETIRAcetam (KEPPRA) 100 MG/ML solution 500 mg  500 mg Oral BID Alexis Goodell, MD   500 mg at 04/23/18 0909  . loperamide (IMODIUM) capsule 2 mg  2 mg Oral QID PRN Fritzi Mandes, MD      . LORazepam (ATIVAN) tablet 1 mg  1 mg Oral Q4H PRN Salary, Montell D, MD   1 mg at 04/23/18 0734   Or  . LORazepam (ATIVAN) 2 MG/ML concentrated solution 1 mg  1 mg Sublingual Q4H PRN Salary, Avel Peace, MD       Or  . LORazepam (ATIVAN) injection 1 mg  1 mg Intravenous Q4H PRN Salary, Montell D, MD   1 mg at 04/21/18 1112  . magic mouthwash  15 mL Oral Q6H PRN Salary, Montell D, MD      . morphine CONCENTRATE 10 MG/0.5ML oral solution 5 mg  5 mg Oral Q2H PRN Salary, Montell D, MD   5 mg at 04/23/18 0734   Or  . morphine CONCENTRATE 10 MG/0.5ML oral solution 5 mg  5 mg Sublingual Q2H PRN Salary, Montell D, MD      . naloxegol oxalate (MOVANTIK) tablet 25 mg  25 mg Oral Daily , Kirt Boys, NP   25 mg at 04/23/18 0909  . nystatin (MYCOSTATIN/NYSTOP) topical powder   Topical TID PRN Salary, Montell D, MD      . ondansetron (ZOFRAN-ODT) disintegrating tablet 4 mg  4 mg Oral Q6H PRN Salary, Montell D, MD       Or  . ondansetron (ZOFRAN) injection 4 mg  4 mg Intravenous Q6H PRN Salary, Montell D, MD      . oxybutynin (DITROPAN) tablet 2.5 mg  2.5 mg Oral QID PRN Salary, Montell D, MD      . polyvinyl alcohol (LIQUIFILM TEARS) 1.4 % ophthalmic solution 1 drop  1 drop Both Eyes QID PRN Salary, Montell D, MD      . prochlorperazine (COMPAZINE) tablet 10 mg  10 mg Oral Q6H PRN Fritzi Mandes, MD      . promethazine (PHENERGAN) injection 12.5 mg  12.5 mg Intravenous Q6H PRN Saundra Shelling, MD   12.5 mg at 04/19/18 1805  . scopolamine (TRANSDERM-SCOP) 1 MG/3DAYS 1.5 mg  1 patch Transdermal Q72H Fritzi Mandes, MD   1.5 mg at 04/21/18 2355  . sodium chloride flush (NS) 0.9 % injection 3 mL  3 mL Intravenous Q12H Salary, Montell D, MD   3 mL at 04/23/18 0918  . sodium chloride flush (NS) 0.9 % injection 3 mL  3 mL  Intravenous PRN Salary, Holly Bodily D, MD   3 mL at 04/23/18 0910  . sodium phosphate (FLEET) 7-19 GM/118ML enema 1 enema  1 enema Rectal Daily PRN Saundra Shelling, MD   1 enema at 04/20/18 1130  . traZODone (DESYREL) tablet 25 mg  25 mg Oral QHS PRN Salary, Montell D, MD      . triamcinolone cream (KENALOG) 0.1 %   Topical BID PRN Salary, Montell D, MD        VITAL SIGNS: BP (!) 148/96 (BP Location: Left Arm)   Pulse 78   Temp (!) 97.3 F (36.3 C) (Axillary)   Resp 17   Ht 5\' 5"  (1.651 m)   Wt 128 lb 6.4 oz (58.2 kg)   LMP  11/28/2011   SpO2 95%   BMI 21.37 kg/m  Filed Weights   04/18/18 1500 04/22/18 0633  Weight: 146 lb (66.2 kg) 128 lb 6.4 oz (58.2 kg)    Estimated body mass index is 21.37 kg/m as calculated from the following:   Height as of this encounter: 5\' 5"  (1.651 m).   Weight as of this encounter: 128 lb 6.4 oz (58.2 kg).  LABS: CBC:    Component Value Date/Time   WBC 7.1 04/21/2018 0543   HGB 10.7 (L) 04/21/2018 0543   HGB 14.8 04/29/2014 1327   HCT 32.6 (L) 04/21/2018 0543   HCT 43.8 04/29/2014 1327   PLT 425 (H) 04/21/2018 0543   PLT 206 04/29/2014 1327   MCV 91.1 04/21/2018 0543   MCV 95.0 04/29/2014 1327   NEUTROABS 3.0 04/17/2018 0815   NEUTROABS 3.9 04/29/2014 1327   LYMPHSABS 0.6 (L) 04/17/2018 0815   LYMPHSABS 1.7 04/29/2014 1327   MONOABS 0.4 04/17/2018 0815   MONOABS 0.6 04/29/2014 1327   EOSABS 0.0 04/17/2018 0815   EOSABS 0.1 04/29/2014 1327   BASOSABS 0.1 04/17/2018 0815   BASOSABS 0.0 04/29/2014 1327   Comprehensive Metabolic Panel:    Component Value Date/Time   NA 134 (L) 04/21/2018 0543   NA 138 12/16/2017   NA 141 04/29/2014 1327   K 4.6 04/21/2018 0543   K 4.0 04/29/2014 1327   CL 96 (L) 04/21/2018 0543   CL 106 08/20/2012 0802   CO2 30 04/21/2018 0543   CO2 24 04/29/2014 1327   BUN 18 04/21/2018 0543   BUN 30 (A) 12/16/2017   BUN 15.7 04/29/2014 1327   CREATININE 0.81 04/21/2018 0543   CREATININE 1.58 (H) 12/15/2017 1525    CREATININE 0.9 04/29/2014 1327   GLUCOSE 150 (H) 04/21/2018 0543   GLUCOSE 97 04/29/2014 1327   GLUCOSE 100 (H) 08/20/2012 0802   CALCIUM 8.4 (L) 04/21/2018 0543   CALCIUM 9.7 04/29/2014 1327   AST 72 (H) 04/18/2018 1503   AST 21 04/29/2014 1327   ALT 40 04/18/2018 1503   ALT 30 04/29/2014 1327   ALKPHOS 175 (H) 04/18/2018 1503   ALKPHOS 124 04/29/2014 1327   BILITOT 0.3 04/18/2018 1503   BILITOT 0.34 04/29/2014 1327   PROT 6.4 (L) 04/18/2018 1503   PROT 7.6 04/29/2014 1327   ALBUMIN 2.9 (L) 04/18/2018 1503   ALBUMIN 4.1 04/29/2014 1327    RADIOGRAPHIC STUDIES: Ct Head Wo Contrast  Result Date: 04/18/2018 CLINICAL DATA:  Syncopal episode, seizures, VP shunt EXAM: CT HEAD WITHOUT CONTRAST TECHNIQUE: Contiguous axial images were obtained from the base of the skull through the vertex without intravenous contrast. COMPARISON:  03/05/2018 MRI FINDINGS: Brain: Right frontal shunt tubing terminates in right lateral ventricle close to midline. There is diffuse enlargement of the ventricular system, difficult to exclude mild hydrocephalus or shunt malfunction. Ventricles remain enlarged and similar to the 03/05/2018 MR. Mild periventricular white matter microvascular ischemic changes. No acute intracranial hemorrhage, mass lesion, definite infarction, midline shift, herniation, extra-axial fluid collection. No focal mass effect or edema. Cisterns are patent. Cerebellar atrophy as well. Vascular: No hyperdense vessel or unexpected calcification. Skull: Normal. Negative for fracture or focal lesion. Sinuses/Orbits: No acute finding. Other: None. IMPRESSION: Interval right frontal shunt insertion. Similar diffuse ventricular enlargement of the entire ventricular system. Difficult to exclude hydrocephalus or shunt malfunction. Minor white matter microvascular changes Stable brain atrophy pattern. No other acute intracranial abnormality or hemorrhage. Electronically Signed   By: Jerilynn Mages.  Shick M.D.  On:  04/18/2018 20:20   Ct Chest Wo Contrast  Result Date: 04/04/2018 CLINICAL DATA:  Metastatic breast cancer. Status post bilateral mastectomy. EXAM: CT CHEST, ABDOMEN AND PELVIS WITHOUT CONTRAST TECHNIQUE: Multidetector CT imaging of the chest, abdomen and pelvis was performed following the standard protocol without IV contrast. COMPARISON:  02/06/2018 FINDINGS: CT CHEST FINDINGS Cardiovascular: The heart size is normal. No substantial pericardial effusion. Right Port-A-Cath tip is at the SVC/RA junction. Mediastinum/Nodes: No mediastinal lymphadenopathy. There is no hilar lymphadenopathy. Esophagus appears diffusely fluid-filled. There is no axillary lymphadenopathy. Lungs/Pleura: The central tracheobronchial airways are patent. Stable appearance of apparent scarring in the right apex. Mild atelectasis in the right middle and lower lobes is new in the interval. There is a new small right pleural effusion. No suspicious nodule or mass in the left lung. Minimal atelectasis noted left lower lobe. Tiny left pleural effusion evident. Musculoskeletal: Stable similar diffuse heterogeneous bone mineralization CT ABDOMEN PELVIS FINDINGS Hepatobiliary: No focal abnormality in the liver parenchyma. High attenuation material in the gallbladder lumen may be related to stones or sludge. No intrahepatic or extrahepatic biliary dilation. Pancreas: No focal mass lesion. No dilatation of the main duct. No intraparenchymal cyst. No peripancreatic edema. Spleen: No splenomegaly. No focal mass lesion. Adrenals/Urinary Tract: Similar to the thickening of the left adrenal gland. Thickening of the right adrenal gland is also similar to prior. Bilateral percutaneous nephrostomy tubes been removed in the interval. There is mild to moderate right hydronephrosis with mild fullness of the left intrarenal collecting system. Neither ureter is distended beyond its midpoint. Urinary bladder is decompressed. Stomach/Bowel: Stomach is  nondistended. No gastric wall thickening. No evidence of outlet obstruction. Duodenum is normally positioned as is the ligament of Treitz. No small bowel wall thickening. No small bowel dilatation. The terminal ileum is normal. The appendix is not visualized, but there is no edema or inflammation in the region of the cecum. Colon is diffusely decompressed. Vascular/Lymphatic: No abdominal aortic aneurysm. No retroperitoneal lymphadenopathy. No pelvic sidewall lymphadenopathy. Reproductive: But The uterus is unremarkable. There is no adnexal mass. Other: Moderate to large volume ascites is new in the interval. There is vascular congestion/nodularity in the gastrocolic ligament and omentum suggestion of peritoneal thickening seen in the left pelvis (96/2) and posterior pelvis bilaterally (102/2). Musculoskeletal: Similar appearance of heterogeneous bone mineralization with some more densely sclerotic lesions identified throughout as examples, see L5 vertebral body (84/2) left pubic bone (112/2) and posterior left inferior pubic ramus (118/2). IMPRESSION: 1. Interval development of moderate to large volume ascites with congestion and nodularity in the gastrocolic ligament and omentum. Imaging features highly concerning for metastatic involvement. 2. Interval removal of percutaneous nephrostomy tubes with mild to moderate right hydronephrosis and mild fullness of the left intrarenal collecting system. 3. Diffuse heterogeneous bone mineralization with scattered sclerotic lesion similar to prior and compatible with metastatic disease. 4. Small right and tiny left pleural effusions. 5. Urinary bladder decompressed on today's study and not well evaluated. Electronically Signed   By: Misty Stanley M.D.   On: 04/04/2018 11:55   US Renal  Result Date: 04/19/2018 CLINICAL DATA:  Nephrostomy status. EXAM: RENAL / URINARY TRACT ULTRASOUND COMPLETE COMPARISON:  None. FINDINGS: Right Kidney: Renal measurements: 11.6 x 5 x 4.3  cm = volume: 129 mL . Echogenicity within normal limits. Mild hydronephrosis is noted. Nephrostomy tube is present. Left Kidney: Renal measurements: 11.3 x 5.3 x 3.9 cm = volume: 121 mL. Echogenicity within normal limits. Moderate hydronephrosis is noted Bladder: Appears  normal for degree of bladder distention. No ureteral jets are noted. Ascites are noted in the abdomen pelvis. Bilateral pleural effusions are noted. IMPRESSION: Right nephrostomy tube is identified. There is mild right hydronephrosis. Moderate left hydronephrosis is noted. Ascites identified in the abdomen and pelvis. Bilateral pleural effusions. Electronically Signed   By: Abelardo Diesel M.D.   On: 04/19/2018 11:35   US Renal  Result Date: 04/03/2018 CLINICAL DATA:  Acute kidney injury. EXAM: RENAL / URINARY TRACT ULTRASOUND COMPLETE COMPARISON:  CT of the abdomen and pelvis 02/06/2018 FINDINGS: Right Kidney: Renal measurements: 12.7 x 4.7 x 5.7 cm = volume: 171 mL. Moderate to severe recurrent right-sided hydronephrosis is present. No obstructing lesion or mass is present. Moderate renal thinning is present. Left Kidney: Renal measurements: 11.5 x 4.3 x 5.0 cm = volume: 129 mL. Moderate left-sided hydronephrosis is present. Parenchymal thickness is normal. Bladder: Under distended. Moderate diffuse abdominal ascites is present. Bilateral pleural effusions are noted. IMPRESSION: 1. Recurrent bilateral hydronephrosis, right greater than left. 2. Moderate diffuse abdominal ascites. 3. Bilateral pleural effusions. Electronically Signed   By: San Morelle M.D.   On: 04/03/2018 16:48   US Paracentesis  Result Date: 04/20/2018 INDICATION: Patient with history of metastatic breast carcinoma with recurrent malignant ascites. Request made for therapeutic paracentesis. EXAM: ULTRASOUND GUIDED THERAPEUTIC PARACENTESIS MEDICATIONS: None COMPLICATIONS: None immediate. PROCEDURE: Informed written consent was obtained from the patient after a  discussion of the risks, benefits and alternatives to treatment. A timeout was performed prior to the initiation of the procedure. Initial ultrasound scanning demonstrates a moderate to large amount of ascites within the left lower abdominal quadrant. The left lower abdomen was prepped and draped in the usual sterile fashion. 1% lidocaine was used for local anesthesia. Following this, a 6 Fr Safe-T-Centesis catheter was introduced. An ultrasound image was saved for documentation purposes. The paracentesis was performed. The catheter was removed and a dressing was applied. The patient tolerated the procedure well without immediate post procedural complication. FINDINGS: A total of approximately 3.2 liters of slightly hazy, yellow fluid was removed. IMPRESSION: Successful ultrasound-guided therapeutic paracentesis yielding 3.2 liters of peritoneal fluid. Read by: Rowe Robert, PA-C Electronically Signed   By: Jerilynn Mages.  Shick M.D.   On: 04/20/2018 10:58   US Paracentesis  Result Date: 04/06/2018 INDICATION: Metastatic breast cancer, ascites, abdominal distension EXAM: ULTRASOUND GUIDED RIGHT PARACENTESIS MEDICATIONS: 1% LIDOCAINE LOCAL COMPLICATIONS: None immediate. PROCEDURE: Informed written consent was obtained from the patient after a discussion of the risks, benefits and alternatives to treatment. A timeout was performed prior to the initiation of the procedure. Initial ultrasound scanning demonstrates a large amount of ascites within the right lower abdominal quadrant. The right lower abdomen was prepped and draped in the usual sterile fashion. 1% lidocaine with epinephrine was used for local anesthesia. Following this, a 6 Fr Safe-T-Centesis catheter was introduced. An ultrasound image was saved for documentation purposes. The paracentesis was performed. The catheter was removed and a dressing was applied. The patient tolerated the procedure well without immediate post procedural complication. FINDINGS: A total of  approximately 4.2 L of PERITONEAL fluid was removed. Samples were sent to the laboratory as requested by the clinical team. IMPRESSION: Successful ultrasound-guided paracentesis yielding 4.2 liters of peritoneal fluid. Electronically Signed   By: Jerilynn Mages.  Shick M.D.   On: 04/06/2018 11:19   Dg Abd 2 Views  Result Date: 04/18/2018 CLINICAL DATA:  Initial evaluation for constipation. EXAM: ABDOMEN - 2 VIEW COMPARISON:  None. FINDINGS: Paucity of gas  limits evaluation of the bowels. No evidence for obstruction. No free air on upright view. No abnormal bowel wall thickening. Moderate retained stool within the colon, most notable at the splenic flexure. No soft tissue mass or abnormal calcification. Right-sided percutaneous nephrostomy tube in place. No acute osseous abnormality. Right basilar atelectasis noted. Right-sided Port-A-Cath noted as well. IMPRESSION: Nonobstructive bowel gas pattern with moderate retained stool within the colon. Electronically Signed   By: Jeannine Boga M.D.   On: 04/18/2018 17:44   Ct Renal Stone Study  Result Date: 04/04/2018 CLINICAL DATA:  Metastatic breast cancer. Status post bilateral mastectomy. EXAM: CT CHEST, ABDOMEN AND PELVIS WITHOUT CONTRAST TECHNIQUE: Multidetector CT imaging of the chest, abdomen and pelvis was performed following the standard protocol without IV contrast. COMPARISON:  02/06/2018 FINDINGS: CT CHEST FINDINGS Cardiovascular: The heart size is normal. No substantial pericardial effusion. Right Port-A-Cath tip is at the SVC/RA junction. Mediastinum/Nodes: No mediastinal lymphadenopathy. There is no hilar lymphadenopathy. Esophagus appears diffusely fluid-filled. There is no axillary lymphadenopathy. Lungs/Pleura: The central tracheobronchial airways are patent. Stable appearance of apparent scarring in the right apex. Mild atelectasis in the right middle and lower lobes is new in the interval. There is a new small right pleural effusion. No suspicious  nodule or mass in the left lung. Minimal atelectasis noted left lower lobe. Tiny left pleural effusion evident. Musculoskeletal: Stable similar diffuse heterogeneous bone mineralization CT ABDOMEN PELVIS FINDINGS Hepatobiliary: No focal abnormality in the liver parenchyma. High attenuation material in the gallbladder lumen may be related to stones or sludge. No intrahepatic or extrahepatic biliary dilation. Pancreas: No focal mass lesion. No dilatation of the main duct. No intraparenchymal cyst. No peripancreatic edema. Spleen: No splenomegaly. No focal mass lesion. Adrenals/Urinary Tract: Similar to the thickening of the left adrenal gland. Thickening of the right adrenal gland is also similar to prior. Bilateral percutaneous nephrostomy tubes been removed in the interval. There is mild to moderate right hydronephrosis with mild fullness of the left intrarenal collecting system. Neither ureter is distended beyond its midpoint. Urinary bladder is decompressed. Stomach/Bowel: Stomach is nondistended. No gastric wall thickening. No evidence of outlet obstruction. Duodenum is normally positioned as is the ligament of Treitz. No small bowel wall thickening. No small bowel dilatation. The terminal ileum is normal. The appendix is not visualized, but there is no edema or inflammation in the region of the cecum. Colon is diffusely decompressed. Vascular/Lymphatic: No abdominal aortic aneurysm. No retroperitoneal lymphadenopathy. No pelvic sidewall lymphadenopathy. Reproductive: But The uterus is unremarkable. There is no adnexal mass. Other: Moderate to large volume ascites is new in the interval. There is vascular congestion/nodularity in the gastrocolic ligament and omentum suggestion of peritoneal thickening seen in the left pelvis (96/2) and posterior pelvis bilaterally (102/2). Musculoskeletal: Similar appearance of heterogeneous bone mineralization with some more densely sclerotic lesions identified throughout as  examples, see L5 vertebral body (84/2) left pubic bone (112/2) and posterior left inferior pubic ramus (118/2). IMPRESSION: 1. Interval development of moderate to large volume ascites with congestion and nodularity in the gastrocolic ligament and omentum. Imaging features highly concerning for metastatic involvement. 2. Interval removal of percutaneous nephrostomy tubes with mild to moderate right hydronephrosis and mild fullness of the left intrarenal collecting system. 3. Diffuse heterogeneous bone mineralization with scattered sclerotic lesion similar to prior and compatible with metastatic disease. 4. Small right and tiny left pleural effusions. 5. Urinary bladder decompressed on today's study and not well evaluated. Electronically Signed   By: Verda Cumins.D.  On: 04/04/2018 11:55   Ir Nephrostomy Placement Right  Result Date: 04/06/2018 INDICATION: Metastatic breast cancer, recurrent right hydronephrosis EXAM: ULTRASOUND AND FLUOROSCOPIC RIGHT NEPHROSTOMY INSERTION COMPARISON:  04/04/2018 MEDICATIONS: CIPRO 400 MG; The antibiotic was administered in an appropriate time frame prior to skin puncture. ANESTHESIA/SEDATION: Fentanyl 50 mcg IV; Versed 2 mg IV Moderate Sedation Time:  11 minutes The patient was continuously monitored during the procedure by the interventional radiology nurse under my direct supervision. CONTRAST:  10 cc-administered into the collecting system(s) FLUOROSCOPY TIME:  Fluoroscopy Time: 0 minutes 30 seconds ( mGy). COMPLICATIONS: None immediate. PROCEDURE: Informed written consent was obtained from the patient after a thorough discussion of the procedural risks, benefits and alternatives. All questions were addressed. Maximal Sterile Barrier Technique was utilized including caps, mask, sterile gowns, sterile gloves, sterile drape, hand hygiene and skin antiseptic. A timeout was performed prior to the initiation of the procedure. Previous imaging reviewed. Patient position prone.  Right hydronephrotic kidney was localized and marked. Under sterile conditions anesthesia, an 18 gauge introducer needle was advanced into a dilated mid to lower pole calyx. Needle position confirmed with ultrasound. Images obtained for documentation. There was return of urine. Guidewire inserted followed by tract dilatation to insert a 10 French nephrostomy. Retention loop formed the renal pelvis. Contrast injection confirms position. Images obtained for documentation. Catheter secured with a prolene suture and a sterile dressing. Gravity drainage bag connected. No immediate complication. Patient tolerated the procedure well. IMPRESSION: Successful ultrasound and fluoroscopic 10 French right nephrostomy insertion Electronically Signed   By: Jerilynn Mages.  Shick M.D.   On: 04/06/2018 12:05    PERFORMANCE STATUS (ECOG) : 4 - Bedbound  Review of Systems As noted above. Otherwise, a complete review of systems is negative.  Physical Exam General: ill appearing Cardiovascular: regular rate and rhythm Pulmonary: clear ant fields Abdomen: full, firm, nttp GU: no suprapubic tenderness Extremities: no edema, no joint deformities Skin: no rashes Neurological: Weakness, alert and oriented  IMPRESSION: Patient remains frail but is comfortable appearing. She continues to have intermittent pain requiring prn opioids. I note that morphine was started over the weekend. Would recommend simplifying regimen to one short acting opioid and one long acting opioid. As patient is going home, medication regimen will likely need to be adjusted to account for the hospice formulary.   I would recommend continuation of OxyContin and use hydromorphone 1-2mg  PO Q4H prn for BTP. Consideration could be given for initiation of methadone if needed to control severe pain.   Would continue Movantik at time of discharge from opioid-induced constipation.   Continue antiemetics with Zofran and prn Compazine.  Spoke with husband and patient  regarding plan. They still confirm desire to pursue hospice and comfort at home.   Patient will need a hospital bed in the home.   Case discussed with CM, hospice liaison, and Dr. Tasia Catchings.    PLAN: 1.  Home with hospice 2.  Discharge medication recommendations as outlined above 3.  DNR  Patient expressed understanding and was in agreement with this plan. She also understands that She can call clinic at any time with any questions, concerns, or complaints.    Time Total: 30 minutes  Visit consisted of counseling and education dealing with the complex and emotionally intense issues of symptom management and palliative care in the setting of serious and potentially life-threatening illness.Greater than 50%  of this time was spent counseling and coordinating care related to the above assessment and plan.  Signed by: Altha Harm,  PhD, NP-C [phone] (Work Cell)

## 2018-04-23 NOTE — Progress Notes (Signed)
Patient is going home under hospice. She requires a hospital bed due to severe functional limitations as she is no longer ambulatory. She has stage IV breast cancer with recent syncope, malignant ascites, and intractable pain (C50.4, R55, G89.3, R18.0)  The patient has a medical condition (stage IV cancer) requiring positioning of the body in ways not feasible with an ordinary bed in order to to alleviate pain.   The patient has a medical condition (stage IV cancer) which requires positioning of the of the body in ways not feasible with an ordinary bed.   A variable height hospital bed is required to facilitate transfers to a wheelchair.    Time Total: 20 minutes  Visit consisted of counseling and education dealing with the complex and emotionally intense issues of symptom management and palliative care in the setting of serious and potentially life-threatening illness.Greater than 50%  of this time was spent counseling and coordinating care related to the above assessment and plan.  Signed by: Altha Harm, PhD, NP-C [phone] (Work Cell)

## 2018-04-23 NOTE — Progress Notes (Signed)
Hematology/Oncology Progress Note [facility] [phone] [phone]  Patient Care Team: [name], MD as PCP - General (Internal Medicine) Barrington Ellison, RN as [facility]   Name of the patient: Mary [name]  [id number]  Dec 02, 1960  Date of visit: 04/23/18   INTERVAL HISTORY-  Patient was seen and examined at bedside. She felt nausea has been better controlled.  Had a bowel movement yesterday. Pain is well controlled with Dilaudid. During the weekend, patient and husband have discussion with Dr. Jerelyn Charles and Vonna Kotyk borders that they would like to proceed with hospice.    Review of systems- Review of Systems  Constitutional: Positive for fatigue. Negative for appetite change, chills, diaphoresis and fever.  HENT:   Negative for hearing loss and voice change.   Eyes: Negative for eye problems.  Respiratory: Negative for chest tightness and cough.   Cardiovascular: Negative for chest pain.  Gastrointestinal: Positive for abdominal distention and constipation. Negative for abdominal pain and blood in stool.  Endocrine: Negative for hot flashes.  Genitourinary: Negative for difficulty urinating and frequency.   Musculoskeletal: Negative for arthralgias.  Skin: Negative for itching and rash.  Neurological: Positive for headaches. Negative for extremity weakness.  Hematological: Negative for adenopathy.  Psychiatric/Behavioral: Negative for confusion.    Allergies  Allergen Reactions  . Adhesive [Tape] Other (See Comments)    Redness:Please use paper tape  . Albumin (Human) Swelling and Rash    Patient Active Problem List   Diagnosis Date Noted  . Malignant ascites   . Nephrostomy status ([place])   . 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 Syncope   . Constipation   . Palliative care encounter   . Neoplasm related pain   . Altered mental status 04/18/2018  . 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 Weakness generalized 04/06/2018  . 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 Goals of care, counseling/discussion 01/14/2018  . Edema of left lower extremity   . Metastatic breast cancer ([place])   . Hypercalcemia 12/16/2017  . Malignancy ([place])   . Acute deep vein thrombosis (DVT) of distal vein of left lower extremity ([place])   . Dizziness   . Bilateral posterior neck pain 11/24/2017  . Elevated liver enzymes 11/24/2017  . Tubular adenoma of colon 10/11/2017  . 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 Generalized anxiety disorder 09/19/2017  . Vertigo, labyrinthine, bilateral 08/27/2017  . Prediabetes 08/27/2017  . Encounter for screening colonoscopy 09/21/2014  . Other malaise and fatigue 12/14/2013  . Hot flashes, menopausal 08/08/2012  . Neutropenia, drug-induced ([place]) 12/15/2011  . Primary cancer of upper outer quadrant of left female breast ([place]) 11/17/2011  . Hypertension 09/23/2011  . PLANTAR FASCIITIS, LEFT 12/01/2009  . UNEQUAL LEG LENGTH 12/01/2009     Past Medical History:  Diagnosis Date  . Anxiety    takes Xanax prn  . Breast cancer [facility]) August 2013   bilateral, er/pr+, bladder (02/2018..mets from breast ca)  . Chronic kidney disease 01/2018   recent shut down of kidneys with labs out of whack  . Complication of anesthesia    pt states she had the shakes extremely bad  . Cough    at night d/t reflux;nothing productive  . Dental crowns present    x 4  . Depression   . GERD (gastroesophageal reflux disease)    takes Nexium daily  . History of bronchitis    08/2011  . History of colon polyps   . Hot  flashes, menopausal 08/08/2012  . IBS (irritable bowel syndrome)    immodium prn  . Insomnia    takes Ambien nightly  . Irritable bowel syndrome   . 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 Neutropenia, drug-induced ([place]) 12/15/2011  . Nocturia   . Plantar fasciitis   . S/P radiation therapy 07/03/2012 through 08/18/2038                                                         Right chest wall/regional lymph nodes 5040 cGy 28 sessions, right chest wall/mastectomy scar boost 1000 cGy 5 sessions                          . Status post chemotherapy    FEC 100 starting on 12/08/2011 - 01/18/12/single agent Taxol x12 weeks from 02/02/2012 through January 2014   . Urinary frequency   . Use of tamoxifen (Nolvadex)    now stopped. will be starting ibrance on 03/05/18  . Vertigo      Past Surgical History:  Procedure Laterality Date  . BREAST LUMPECTOMY  2008   left  . COLONOSCOPY    . COLONOSCOPY WITH PROPOFOL N/A 10/10/2017   Procedure: COLONOSCOPY WITH PROPOFOL;  Surgeon: Lucilla Lame, MD;  Location: [facility];  Service: Endoscopy;  Laterality: N/A;  . CYSTOSCOPY WITH STENT PLACEMENT Bilateral 12/17/2017   Procedure: CYSTOSCOPY WITH STENT PLACEMENT;  Surgeon: Hollice Espy, MD;  Location: [facility];  Service: Urology;  Laterality: Bilateral;  . IR NEPHROSTOMY EXCHANGE LEFT  02/13/2018  . IR NEPHROSTOMY EXCHANGE RIGHT  01/02/2018  . IR NEPHROSTOMY EXCHANGE RIGHT  02/13/2018  . IR NEPHROSTOMY PLACEMENT LEFT  12/19/2017  . IR NEPHROSTOMY PLACEMENT RIGHT  12/19/2017  . IR NEPHROSTOMY PLACEMENT RIGHT  04/06/2018  . LUMBAR PUNCTURE  03/01/2018  . MASTECTOMY Bilateral 2013  . MASTECTOMY WITH AXILLARY LYMPH NODE DISSECTION Right 05/18/2012   Procedure: MASTECTOMY WITH AXILLARY LYMPH NODE DISSECTION;  Surgeon: Adin Hector, MD;  Location: [facility];  Service: General;  Laterality: Right;  Bilateral Total Mastectomy , right axillary lymph node dissection left axillary sentinel node biopsy, removal of Porta Cath  . NEPHROSTOMY TUBE REMOVAL Bilateral 03/01/2018   removed tubes in dr. Erlene Quan office  . PORT-A-CATH REMOVAL N/A 05/18/2012   Procedure: REMOVAL PORT-A-CATH;  Surgeon: Adin Hector, MD;  Location: [facility];  Service: General;  Laterality: N/A;  . PORTA CATH INSERTION N/A 12/26/2017   Procedure: PORTA CATH INSERTION;  Surgeon:  Katha Cabal, MD;  Location: [facility];  Service: Cardiovascular;  Laterality: N/A;  . PORTACATH PLACEMENT  12/02/2011   Procedure: INSERTION PORT-A-CATH;  Surgeon: Adin Hector, MD;  Location: [facility];  Service: General;  Laterality: N/A;  insertion port a cath with fluoroscopy  . SIMPLE MASTECTOMY WITH AXILLARY SENTINEL NODE BIOPSY Left 05/18/2012   Procedure: SIMPLE MASTECTOMY WITH AXILLARY SENTINEL NODE BIOPSY;  Surgeon: Adin Hector, MD;  Location: [facility];  Service: General;  Laterality: Left;    Social History   Socioeconomic History  . Marital status: Married    Spouse name: Jenny Reichmann  . Number of children: 0  . Years of education: Not on file  . Highest education level: Not on file  Occupational History  . Occupation: Network engineer II  Employer: [employer]    Comment: on fmla d/t seizure in 10/19  Social Needs  . Financial resource strain: Not on file  . Food insecurity:    Worry: Not on file    Inability: Not on file  . Transportation needs:    Medical: Not on file    Non-medical: Not on file  Tobacco Use  . Smoking status: Former Smoker    Packs/day: 0.50    Years: 15.00    Pack years: 7.50    Types: Cigarettes    Last attempt to quit: 03/21/1997    Years since quitting: 21.1  . Smokeless tobacco: Never Used  Substance and Sexual Activity  . Alcohol use: Not Currently    Alcohol/week: 0.0 standard drinks    Comment: occasionally wine  . Drug use: No  . Sexual activity: Yes    Birth control/protection: None  Lifestyle  . Physical activity:    Days per week: Not on file    Minutes per session: Not on file  . Stress: Not on file  Relationships  . Social connections:    Talks on phone: Not on file    Gets together: Not on file    Attends religious service: Not on file    Active member of club or organization: Not on file    Attends meetings of clubs or organizations: Not on file    Relationship status: Not on file  .  Intimate partner violence:    Fear of current or ex partner: Not on file    Emotionally abused: Not on file    Physically abused: Not on file    Forced sexual activity: Not on file  Other Topics Concern  . Not on file  Social History Narrative   Married to [employer]  No children 40 years old with fmp.  20 year use of birth control pills.       Family History  Problem Relation Age of Onset  . Hypertension Mother   . Diabetes Mother   . Hypertension Maternal Grandmother   . Pancreatic cancer Paternal Grandmother        diagnosed in her 68s  . Ovarian cancer Maternal Aunt        maternal grandmother's sister  . Breast cancer Paternal Aunt        diagnosed in late 41s early 30s  . Parkinsonism Paternal Grandfather      Current Facility-Administered Medications:  .  0.9 %  sodium chloride infusion, 250 mL, Intravenous, PRN, Salary, [place] D, MD .  acetaminophen (TYLENOL) tablet 650 mg, 650 mg, Oral, Q6H PRN **OR** acetaminophen (TYLENOL) suppository 650 mg, 650 mg, Rectal, Q6H PRN, Fritzi Mandes, MD .  ALPRAZolam Duanne Moron) tablet 0.25 mg, 0.25 mg, Oral, BID PRN, Fritzi Mandes, MD, 0.25 mg at 04/23/18 0254 .  antiseptic oral rinse (BIOTENE) solution 15 mL, 15 mL, Topical, PRN, Salary, Montell D, MD .  apixaban (ELIQUIS) tablet 2.5 mg, 2.5 mg, Oral, BID, Fritzi Mandes, MD, 2.5 mg at 04/23/18 0909 .  chlorproMAZINE (THORAZINE) 12.5 mg in sodium chloride 0.9 % 25 mL IVPB, 12.5 mg, Intravenous, Q6H PRN, Salary, Montell D, MD .  dexamethasone (DECADRON) tablet 4 mg, 4 mg, Oral, Q12H, Borders, Kirt Boys, NP, 4 mg at 04/23/18 0909 .  diazepam (VALIUM) tablet 10 mg, 10 mg, Oral, QHS PRN, Fritzi Mandes, MD, 10 mg at 04/22/18 2209 .  diphenhydrAMINE (BENADRYL) injection 25 mg, 25 mg, Intravenous, Q6H PRN, Salary, Montell D, MD, 25 mg at 04/22/18  2209 .  dronabinol (MARINOL) capsule 5 mg, 5 mg, Oral, BID AC, Fritzi Mandes, MD, 5 mg at 04/23/18 1145 .  furosemide (LASIX) injection 40 mg, 40 mg,  Intravenous, Daily, Pyreddy, Pavan, MD, 40 mg at 04/23/18 0910 .  haloperidol (HALDOL) tablet 0.5 mg, 0.5 mg, Oral, Q4H PRN **OR** haloperidol (HALDOL) 2 MG/ML solution 0.5 mg, 0.5 mg, Sublingual, Q4H PRN **OR** haloperidol lactate (HALDOL) injection 0.5 mg, 0.5 mg, Intravenous, Q4H PRN, Salary, Montell D, MD .  HYDROmorphone (DILAUDID) injection 0.5 mg, 0.5 mg, Intravenous, Q2H PRN, Salary, Mont[place] D, MD, 0.5 mg at 04/23/18 0917 .  levETIRAcetam (KEPPRA) 100 MG/ML solution 500 mg, 500 mg, Oral, BID, Alexis Goodell, MD, 500 mg at 04/23/18 0909 .  loperamide (IMODIUM) capsule 2 mg, 2 mg, Oral, QID PRN, Fritzi Mandes, MD .  LORazepam (ATIVAN) tablet 1 mg, 1 mg, Oral, Q4H PRN, 1 mg at 04/23/18 0734 **OR** LORazepam (ATIVAN) 2 MG/ML concentrated solution 1 mg, 1 mg, Sublingual, Q4H PRN **OR** LORazepam (ATIVAN) injection 1 mg, 1 mg, Intravenous, Q4H PRN, Salary, Mont[place] D, MD, 1 mg at 04/21/18 1112 .  magic mouthwash, 15 mL, Oral, Q6H PRN, Salary, Montell D, MD .  morphine CONCENTRATE 10 MG/0.5ML oral solution 5 mg, 5 mg, Oral, Q2H PRN, 5 mg at 04/23/18 0734 **OR** morphine CONCENTRATE 10 MG/0.5ML oral solution 5 mg, 5 mg, Sublingual, Q2H PRN, Salary, Montell D, MD .  naloxegol oxalate (MOVANTIK) tablet 25 mg, 25 mg, Oral, Daily, Borders, Vonna Kotyk R, NP, 25 mg at 04/23/18 0909 .  nystatin (MYCOSTATIN/NYSTOP) topical powder, , Topical, TID PRN, Salary, Montell D, MD .  ondansetron (ZOFRAN-ODT) disintegrating tablet 4 mg, 4 mg, Oral, Q6H PRN **OR** ondansetron (ZOFRAN) injection 4 mg, 4 mg, Intravenous, Q6H PRN, Salary, Montell D, MD .  oxybutynin (DITROPAN) tablet 2.5 mg, 2.5 mg, Oral, QID PRN, Salary, Montell D, MD .  polyvinyl alcohol (LIQUIFILM TEARS) 1.4 % ophthalmic solution 1 drop, 1 drop, Both Eyes, QID PRN, Salary, Montell D, MD .  prochlorperazine (COMPAZINE) tablet 10 mg, 10 mg, Oral, Q6H PRN, Fritzi Mandes, MD .  promethazine (PHENERGAN) injection 12.5 mg, 12.5 mg, Intravenous, Q6H PRN, Pyreddy,  Pavan, MD, 12.5 mg at 04/19/18 1805 .  scopolamine (TRANSDERM-SCOP) 1 MG/3DAYS 1.5 mg, 1 patch, Transdermal, Q72H, Fritzi Mandes, MD, 1.5 mg at 04/21/18 2355 .  sodium chloride flush (NS) 0.9 % injection 3 mL, 3 mL, Intravenous, Q12H, Salary, Mont[place] D, MD, 3 mL at 04/23/18 0918 .  sodium chloride flush (NS) 0.9 % injection 3 mL, 3 mL, Intravenous, PRN, Salary, Montell D, MD, 3 mL at 04/23/18 0910 .  sodium phosphate (FLEET) 7-19 GM/118ML enema 1 enema, 1 enema, Rectal, Daily PRN, Saundra Shelling, MD, 1 enema at 04/20/18 1130 .  traZODone (DESYREL) tablet 25 mg, 25 mg, Oral, QHS PRN, Salary, Montell D, MD .  triamcinolone cream (KENALOG) 0.1 %, , Topical, BID PRN, Gorden Harms, MD   Physical exam:  Vitals:   04/22/18 0445 04/22/18 0633 04/22/18 0915 04/23/18 0555  BP: (!) 141/80  (!) 146/83 (!) 148/96  Pulse: 86  88 78  Resp: 16  18 17   Temp: 98.4 F (36.9 C)  98.6 F (37 C) (!) 97.3 F (36.3 C)  TempSrc: Oral  Oral Axillary  SpO2: 92%  95% 95%  Weight:  128 lb 6.4 oz (58.2 kg)    Height:       Physical Exam  Constitutional: No distress.   Ill appearance.   HENT:  Head: Normocephalic  and atraumatic.  Nose: Nose normal.  Mouth/Throat: Oropharynx is clear and moist. No oropharyngeal exudate.  Eyes: Pupils are equal, round, and reactive to light. EOM are normal. No scleral icterus.  Neck: Normal range of motion. Neck supple.  Cardiovascular: Normal rate and regular rhythm.  No murmur heard. Pulmonary/Chest: Effort normal. No respiratory distress.  Decreased breath sound bilaterally.  Abdominal: Soft. Bowel sounds are normal. She exhibits distension. There is no abdominal tenderness.  Musculoskeletal: Normal range of motion.        General: No edema.  Neurological: She is alert. No cranial nerve deficit.  Skin: Skin is warm and dry. She is not diaphoretic. No erythema.  Psychiatric: Affect normal.       CMP Latest Ref Rng & Units 04/21/2018  Glucose 70 - 99 mg/dL 150(H)    BUN 6 - 20 mg/dL 18  Creatinine 0.44 - 1.00 mg/dL 0.81  Sodium 135 - 145 mmol/L 134(L)  Potassium 3.5 - 5.1 mmol/L 4.6  Chloride 98 - 111 mmol/L 96(L)  CO2 22 - 32 mmol/L 30  Calcium 8.9 - 10.3 mg/dL 8.4(L)  Total Protein 6.5 - 8.1 g/dL -  Total Bilirubin 0.3 - 1.2 mg/dL -  Alkaline Phos 38 - 126 U/L -  AST 15 - 41 U/L -  ALT 0 - 44 U/L -   CBC Latest Ref Rng & Units 04/21/2018  WBC 4.0 - 10.5 K/uL 7.1  Hemoglobin 12.0 - 15.0 g/dL 10.7(L)  Hematocrit 36.0 - 46.0 % 32.6(L)  Platelets 150 - 400 K/uL 425(H)    RADIOGRAPHIC STUDIES: I have personally reviewed the radiological images as listed and agreed with the findings in the report.  Ct Head Wo Contrast  Result Date: 04/18/2018 CLINICAL DATA:  Syncopal episode, seizures, VP shunt EXAM: CT HEAD WITHOUT CONTRAST TECHNIQUE: Contiguous axial images were obtained from the base of the skull through the vertex without intravenous contrast. COMPARISON:  03/05/2018 MRI FINDINGS: Brain: Right frontal shunt tubing terminates in right lateral ventricle close to midline. There is diffuse enlargement of the ventricular system, difficult to exclude mild hydrocephalus or shunt malfunction. Ventricles remain enlarged and similar to the 03/05/2018 MR. Mild periventricular white matter microvascular ischemic changes. No acute intracranial hemorrhage, mass lesion, definite infarction, midline shift, herniation, extra-axial fluid collection. No focal mass effect or edema. Cisterns are patent. Cerebellar atrophy as well. Vascular: No hyperdense vessel or unexpected calcification. Skull: Normal. Negative for fracture or focal lesion. Sinuses/Orbits: No acute finding. Other: None. IMPRESSION: Interval right frontal shunt insertion. Similar diffuse ventricular enlargement of the entire ventricular system. Difficult to exclude hydrocephalus or shunt malfunction. Minor white matter microvascular changes Stable brain atrophy pattern. No other acute intracranial  abnormality or hemorrhage. Electronically Signed   By: Jerilynn Mages.  Shick M.D.   On: 04/18/2018 20:20   Ct Chest Wo Contrast  Result Date: 04/04/2018 CLINICAL DATA:  Metastatic breast cancer. Status post bilateral mastectomy. EXAM: CT CHEST, ABDOMEN AND PELVIS WITHOUT CONTRAST TECHNIQUE: Multidetector CT imaging of the chest, abdomen and pelvis was performed following the standard protocol without IV contrast. COMPARISON:  02/06/2018 FINDINGS: CT CHEST FINDINGS Cardiovascular: The heart size is normal. No substantial pericardial effusion. Right Port-A-Cath tip is at the SVC/RA junction. Mediastinum/Nodes: No mediastinal lymphadenopathy. There is no hilar lymphadenopathy. Esophagus appears diffusely fluid-filled. There is no axillary lymphadenopathy. Lungs/Pleura: The central tracheobronchial airways are patent. Stable appearance of apparent scarring in the right apex. Mild atelectasis in the right middle and lower lobes is new in the interval. There is  a new small right pleural effusion. No suspicious nodule or mass in the left lung. Minimal atelectasis noted left lower lobe. Tiny left pleural effusion evident. Musculoskeletal: Stable similar diffuse heterogeneous bone mineralization CT ABDOMEN PELVIS FINDINGS Hepatobiliary: No focal abnormality in the liver parenchyma. High attenuation material in the gallbladder lumen may be related to stones or sludge. No intrahepatic or extrahepatic biliary dilation. Pancreas: No focal mass lesion. No dilatation of the main duct. No intraparenchymal cyst. No peripancreatic edema. Spleen: No splenomegaly. No focal mass lesion. Adrenals/Urinary Tract: Similar to the thickening of the left adrenal gland. Thickening of the right adrenal gland is also similar to prior. Bilateral percutaneous nephrostomy tubes been removed in the interval. There is mild to moderate right hydronephrosis with mild fullness of the left intrarenal collecting system. Neither ureter is distended beyond its  midpoint. Urinary bladder is decompressed. Stomach/Bowel: Stomach is nondistended. No gastric wall thickening. No evidence of outlet obstruction. Duodenum is normally positioned as is the ligament of Treitz. No small bowel wall thickening. No small bowel dilatation. The terminal ileum is normal. The appendix is not visualized, but there is no edema or inflammation in the region of the cecum. Colon is diffusely decompressed. Vascular/Lymphatic: No abdominal aortic aneurysm. No retroperitoneal lymphadenopathy. No pelvic sidewall lymphadenopathy. Reproductive: But The uterus is unremarkable. There is no adnexal mass. Other: Moderate to large volume ascites is new in the interval. There is vascular congestion/nodularity in the gastrocolic ligament and omentum suggestion of peritoneal thickening seen in the left pelvis (96/2) and posterior pelvis bilaterally (102/2). Musculoskeletal: Similar appearance of heterogeneous bone mineralization with some more densely sclerotic lesions identified throughout as examples, see L5 vertebral body (84/2) left pubic bone (112/2) and posterior left inferior pubic ramus (118/2). IMPRESSION: 1. Interval development of moderate to large volume ascites with congestion and nodularity in the gastrocolic ligament and omentum. Imaging features highly concerning for metastatic involvement. 2. Interval removal of percutaneous nephrostomy tubes with mild to moderate right hydronephrosis and mild fullness of the left intrarenal collecting system. 3. Diffuse heterogeneous bone mineralization with scattered sclerotic lesion similar to prior and compatible with metastatic disease. 4. Small right and tiny left pleural effusions. 5. Urinary bladder decompressed on today's study and not well evaluated. Electronically Signed   By: Misty Stanley M.D.   On: 04/04/2018 11:55   US Renal  Result Date: 04/19/2018 CLINICAL DATA:  Nephrostomy status. EXAM: RENAL / URINARY TRACT ULTRASOUND COMPLETE  COMPARISON:  None. FINDINGS: Right Kidney: Renal measurements: 11.6 x 5 x 4.3 cm = volume: 129 mL . Echogenicity within normal limits. Mild hydronephrosis is noted. Nephrostomy tube is present. Left Kidney: Renal measurements: 11.3 x 5.3 x 3.9 cm = volume: 121 mL. Echogenicity within normal limits. Moderate hydronephrosis is noted Bladder: Appears normal for degree of bladder distention. No ureteral jets are noted. Ascites are noted in the abdomen pelvis. Bilateral pleural effusions are noted. IMPRESSION: Right nephrostomy tube is identified. There is mild right hydronephrosis. Moderate left hydronephrosis is noted. Ascites identified in the abdomen and pelvis. Bilateral pleural effusions. Electronically Signed   By: Abelardo Diesel M.D.   On: 04/19/2018 11:35   US Renal  Result Date: 04/03/2018 CLINICAL DATA:  Acute kidney injury. EXAM: RENAL / URINARY TRACT ULTRASOUND COMPLETE COMPARISON:  CT of the abdomen and pelvis 02/06/2018 FINDINGS: Right Kidney: Renal measurements: 12.7 x 4.7 x 5.7 cm = volume: 171 mL. Moderate to severe recurrent right-sided hydronephrosis is present. No obstructing lesion or mass is present. Moderate renal thinning  is present. Left Kidney: Renal measurements: 11.5 x 4.3 x 5.0 cm = volume: 129 mL. Moderate left-sided hydronephrosis is present. Parenchymal thickness is normal. Bladder: Under distended. Moderate diffuse abdominal ascites is present. Bilateral pleural effusions are noted. IMPRESSION: 1. Recurrent bilateral hydronephrosis, right greater than left. 2. Moderate diffuse abdominal ascites. 3. Bilateral pleural effusions. Electronically Signed   By: San Morelle M.D.   On: 04/03/2018 16:48   US Paracentesis  Result Date: 04/20/2018 INDICATION: Patient with history of metastatic breast carcinoma with recurrent malignant ascites. Request made for therapeutic paracentesis. EXAM: ULTRASOUND GUIDED THERAPEUTIC PARACENTESIS MEDICATIONS: None COMPLICATIONS: None immediate.  PROCEDURE: Informed written consent was obtained from the patient after a discussion of the risks, benefits and alternatives to treatment. A timeout was performed prior to the initiation of the procedure. Initial ultrasound scanning demonstrates a moderate to large amount of ascites within the left lower abdominal quadrant. The left lower abdomen was prepped and draped in the usual sterile fashion. 1% lidocaine was used for local anesthesia. Following this, a 6 Fr Safe-T-Centesis catheter was introduced. An ultrasound image was saved for documentation purposes. The paracentesis was performed. The catheter was removed and a dressing was applied. The patient tolerated the procedure well without immediate post procedural complication. FINDINGS: A total of approximately 3.2 liters of slightly hazy, yellow fluid was removed. IMPRESSION: Successful ultrasound-guided therapeutic paracentesis yielding 3.2 liters of peritoneal fluid. Read by: Rowe Robert, PA-C Electronically Signed   By: Jerilynn Mages.  Shick M.D.   On: 04/20/2018 10:58   US Paracentesis  Result Date: 04/06/2018 INDICATION: Metastatic breast cancer, ascites, abdominal distension EXAM: ULTRASOUND GUIDED RIGHT PARACENTESIS MEDICATIONS: 1% LIDOCAINE LOCAL COMPLICATIONS: None immediate. PROCEDURE: Informed written consent was obtained from the patient after a discussion of the risks, benefits and alternatives to treatment. A timeout was performed prior to the initiation of the procedure. Initial ultrasound scanning demonstrates a large amount of ascites within the right lower abdominal quadrant. The right lower abdomen was prepped and draped in the usual sterile fashion. 1% lidocaine with epinephrine was used for local anesthesia. Following this, a 6 Fr Safe-T-Centesis catheter was introduced. An ultrasound image was saved for documentation purposes. The paracentesis was performed. The catheter was removed and a dressing was applied. The patient tolerated the procedure  well without immediate post procedural complication. FINDINGS: A total of approximately 4.2 L of PERITONEAL fluid was removed. Samples were sent to the laboratory as requested by the clinical team. IMPRESSION: Successful ultrasound-guided paracentesis yielding 4.2 liters of peritoneal fluid. Electronically Signed   By: Jerilynn Mages.  Shick M.D.   On: 04/06/2018 11:19   Dg Abd 2 Views  Result Date: 04/18/2018 CLINICAL DATA:  Initial evaluation for constipation. EXAM: ABDOMEN - 2 VIEW COMPARISON:  None. FINDINGS: Paucity of gas limits evaluation of the bowels. No evidence for obstruction. No free air on upright view. No abnormal bowel wall thickening. Moderate retained stool within the colon, most notable at the splenic flexure. No soft tissue mass or abnormal calcification. Right-sided percutaneous nephrostomy tube in place. No acute osseous abnormality. Right basilar atelectasis noted. Right-sided Port-A-Cath noted as well. IMPRESSION: Nonobstructive bowel gas pattern with moderate retained stool within the colon. Electronically Signed   By: Jeannine Boga M.D.   On: 04/18/2018 17:44   Ct Renal Stone Study  Result Date: 04/04/2018 CLINICAL DATA:  Metastatic breast cancer. Status post bilateral mastectomy. EXAM: CT CHEST, ABDOMEN AND PELVIS WITHOUT CONTRAST TECHNIQUE: Multidetector CT imaging of the chest, abdomen and pelvis was performed following the  standard protocol without IV contrast. COMPARISON:  02/06/2018 FINDINGS: CT CHEST FINDINGS Cardiovascular: The heart size is normal. No substantial pericardial effusion. Right Port-A-Cath tip is at the SVC/RA junction. Mediastinum/Nodes: No mediastinal lymphadenopathy. There is no hilar lymphadenopathy. Esophagus appears diffusely fluid-filled. There is no axillary lymphadenopathy. Lungs/Pleura: The central tracheobronchial airways are patent. Stable appearance of apparent scarring in the right apex. Mild atelectasis in the right middle and lower lobes is new in the  interval. There is a new small right pleural effusion. No suspicious nodule or mass in the left lung. Minimal atelectasis noted left lower lobe. Tiny left pleural effusion evident. Musculoskeletal: Stable similar diffuse heterogeneous bone mineralization CT ABDOMEN PELVIS FINDINGS Hepatobiliary: No focal abnormality in the liver parenchyma. High attenuation material in the gallbladder lumen may be related to stones or sludge. No intrahepatic or extrahepatic biliary dilation. Pancreas: No focal mass lesion. No dilatation of the main duct. No intraparenchymal cyst. No peripancreatic edema. Spleen: No splenomegaly. No focal mass lesion. Adrenals/Urinary Tract: Similar to the thickening of the left adrenal gland. Thickening of the right adrenal gland is also similar to prior. Bilateral percutaneous nephrostomy tubes been removed in the interval. There is mild to moderate right hydronephrosis with mild fullness of the left intrarenal collecting system. Neither ureter is distended beyond its midpoint. Urinary bladder is decompressed. Stomach/Bowel: Stomach is nondistended. No gastric wall thickening. No evidence of outlet obstruction. Duodenum is normally positioned as is the ligament of Treitz. No small bowel wall thickening. No small bowel dilatation. The terminal ileum is normal. The appendix is not visualized, but there is no edema or inflammation in the region of the cecum. Colon is diffusely decompressed. Vascular/Lymphatic: No abdominal aortic aneurysm. No retroperitoneal lymphadenopathy. No pelvic sidewall lymphadenopathy. Reproductive: But The uterus is unremarkable. There is no adnexal mass. Other: Moderate to large volume ascites is new in the interval. There is vascular congestion/nodularity in the gastrocolic ligament and omentum suggestion of peritoneal thickening seen in the left pelvis (96/2) and posterior pelvis bilaterally (102/2). Musculoskeletal: Similar appearance of heterogeneous bone mineralization  with some more densely sclerotic lesions identified throughout as examples, see L5 vertebral body (84/2) left pubic bone (112/2) and posterior left inferior pubic ramus (118/2). IMPRESSION: 1. Interval development of moderate to large volume ascites with congestion and nodularity in the gastrocolic ligament and omentum. Imaging features highly concerning for metastatic involvement. 2. Interval removal of percutaneous nephrostomy tubes with mild to moderate right hydronephrosis and mild fullness of the left intrarenal collecting system. 3. Diffuse heterogeneous bone mineralization with scattered sclerotic lesion similar to prior and compatible with metastatic disease. 4. Small right and tiny left pleural effusions. 5. Urinary bladder decompressed on today's study and not well evaluated. Electronically Signed   By: Misty Stanley M.D.   On: 04/04/2018 11:55   Ir Nephrostomy Placement Right  Result Date: 04/06/2018 INDICATION: Metastatic breast cancer, recurrent right hydronephrosis EXAM: ULTRASOUND AND FLUOROSCOPIC RIGHT NEPHROSTOMY INSERTION COMPARISON:  04/04/2018 MEDICATIONS: CIPRO 400 MG; The antibiotic was administered in an appropriate time frame prior to skin puncture. ANESTHESIA/SEDATION: Fentanyl 50 mcg IV; Versed 2 mg IV Moderate Sedation Time:  11 minutes The patient was continuously monitored during the procedure by the interventional radiology nurse under my direct supervision. CONTRAST:  10 cc-administered into the collecting system(s) FLUOROSCOPY TIME:  Fluoroscopy Time: 0 minutes 30 seconds ( mGy). COMPLICATIONS: None immediate. PROCEDURE: Informed written consent was obtained from the patient after a thorough discussion of the procedural risks, benefits and alternatives. All questions were  addressed. Maximal Sterile Barrier Technique was utilized including caps, mask, sterile gowns, sterile gloves, sterile drape, hand hygiene and skin antiseptic. A timeout was performed prior to the initiation of  the procedure. Previous imaging reviewed. Patient position prone. Right hydronephrotic kidney was localized and marked. Under sterile conditions anesthesia, an 18 gauge introducer needle was advanced into a dilated mid to lower pole calyx. Needle position confirmed with ultrasound. Images obtained for documentation. There was return of urine. Guidewire inserted followed by tract dilatation to insert a 10 French nephrostomy. Retention loop formed the renal pelvis. Contrast injection confirms position. Images obtained for documentation. Catheter secured with a prolene suture and a sterile dressing. Gravity drainage bag connected. No immediate complication. Patient tolerated the procedure well. IMPRESSION: Successful ultrasound and fluoroscopic 10 French right nephrostomy insertion Electronically Signed   By: Jerilynn Mages.  Shick M.D.   On: 04/06/2018 12:05    Assessment and plan-  Patient is a 58 y.o. female  with metastatic breast cancer, visceral crisis, leptomeningeal involvement status post Ommaya reservoir placement, currently admitted due to blanking out spells.  Was seen by neurology and was started on Keppra 500 twice daily for seizure prophylaxis.  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  They have made the decision of not to proceed with any aggressive anti-neoplasm treatment, focus on comfort care and hospice. Plan is to go home with home hospice when equipments are ready.  I am happy to serve as hospice attending continue to provide supportive care.  #Malignant ascites, status post therapeutic paracentesis x2 so far. May need additional therapeutic paracentesis in the future. #Continue seizure prophylaxis. Continue Dexamethasone.   Thank you for allowing me to participate in the care of this patient. Discussed with Dr.Salary and palliative care.    Earlie Server, MD,  PhD Hematology Oncology [facility] Pager- [phone] [id number]

## 2018-04-23 NOTE — Telephone Encounter (Signed)
Patient's husband called the office today and cancelled appointment with Dr. Erlene Quan for tomorrow.  She is an inpatient in the hospital (Room 122).  He also wanted [place] to know that she had a renal ultrasound on 1/30.  Please advise

## 2018-04-23 NOTE — Care Management (Signed)
Spoke to [place] from [employer].  Provided her with the Spouse John cell phone number [phone], Notified Santiago Glad that the patient will need a hospital bed also.  Santiago Glad stated that she would come to see the patient today

## 2018-04-23 NOTE — Progress Notes (Addendum)
[facility] [place] NAME: Mary [name]    MR#:  [id number]  DATE OF BIRTH:  [birth year]  SUBJECTIVE:  Case discussed with neurology, oncology input appreciated, long discussion had with the patient and the patient's husband with caregivers present-they continue to want DNR/comfort care measures going forward - plans for home hospice once equipment is set up at home   REVIEW OF SYSTEMS:    ROS  CONSTITUTIONAL: No documented fever. has fatigue, weakness. No weight gain, no weight loss.  EYES: No blurry or double vision.  ENT: No tinnitus. No postnasal drip. No redness of the oropharynx.  RESPIRATORY: No cough, no wheeze, no hemoptysis. No dyspnea.  CARDIOVASCULAR: No chest pain. No orthopnea. No palpitations. No syncope.  GASTROINTESTINAL: Has nausea, no vomiting or diarrhea. No abdominal pain. No melena or hematochezia.  Has constipation GENITOURINARY: No dysuria or hematuria.  ENDOCRINE: No polyuria or nocturia. No heat or cold intolerance.  HEMATOLOGY: No anemia. No bruising. No bleeding.  INTEGUMENTARY: Light redness of the skin over the neck MUSCULOSKELETAL: No arthritis. No swelling. No gout.  NEUROLOGIC: No numbness, tingling, or ataxia. No seizure-type activity.  Intermittent episodes of loss of consciousness PSYCHIATRIC: No anxiety. No insomnia. No ADD.   DRUG ALLERGIES:   Allergies  Allergen Reactions  . Adhesive [Tape] Other (See Comments)    Redness:Please use paper tape  . Albumin (Human) Swelling and Rash    VITALS:  Blood pressure (!) 148/96, pulse 78, temperature (!) 97.3 F (36.3 C), temperature source Axillary, resp. rate 17, height 5\' 5"  (1.651 m), weight 58.2 kg, last menstrual period 11/28/2011, SpO2 95 %.  PHYSICAL EXAMINATION:   Physical Exam  GENERAL:  58 y.o.-year-old patient lying in the bed with no acute distress.  EYES: Pupils equal, round, reactive to light and accommodation. No scleral icterus.  Extraocular muscles intact.  HEENT: Head atraumatic, normocephalic. Oropharynx and nasopharynx clear.  NECK:  Supple, no jugular venous distention. No thyroid enlargement, no tenderness.  LUNGS: Normal breath sounds bilaterally, no wheezing, rales, rhonchi. No use of accessory muscles of respiration.  CARDIOVASCULAR: S1, S2 normal. No murmurs, rubs, or gallops.  ABDOMEN: Soft, nontender, mildly distended. Bowel sounds present. No organomegaly or mass.  EXTREMITIES: No cyanosis, clubbing or edema b/l.    NEUROLOGIC: Cranial nerves II through XII are intact. No focal Motor or sensory deficits b/l.   PSYCHIATRIC: The patient is alert and oriented x 3.  SKIN:  lesion, or ulcer.  Mild macular rash over the neck area  LABORATORY PANEL:   CBC Recent Labs  Lab 04/21/18 0543  WBC 7.1  HGB 10.7*  HCT 32.6*  PLT 425*   ------------------------------------------------------------------------------------------------------------------ Chemistries  Recent Labs  Lab 04/18/18 1503  04/21/18 0543  NA 134*   < > 134*  K 3.6   < > 4.6  CL 99   < > 96*  CO2 26   < > 30  GLUCOSE 138*   < > 150*  BUN 11   < > 18  CREATININE 0.82   < > 0.81  CALCIUM 8.3*   < > 8.4*  AST 72*  --   --   ALT 40  --   --   ALKPHOS 175*  --   --   BILITOT 0.3  --   --    < > = values in this interval not displayed.   ------------------------------------------------------------------------------------------------------------------  Cardiac Enzymes Recent Labs  Lab 04/18/18 1503  TROPONINI <0.03   ------------------------------------------------------------------------------------------------------------------  RADIOLOGY:  No results found.   ASSESSMENT AND PLAN:  58 year old female patient with history of metastatic breast cancer with meningeal mets currently had a recent placement of Ommaya reservoir by neurosurgery.  Due for next chemotherapy on Friday through the reservoir  *Intermittent loss of  consciousness Resolved Could be subtle seizures Discussed with neurology-continue Keppra for 1 month, EEG was normal, CT head negative  In discussion with the patient and the patient's husband with caregivers present-patient made herself DNR/comfort care going forward given terminal prognosis -continue expectant management with comfort care measures  *Acute malignant ascites Related breast cancer Status post paracentesis with 3.2 L of fluid drained off April 20, 2018 Plan of care as stated above  *Chronic anxiety disorder Plan of care as stated above  *History of nephrostomy tube secondary to right-sided hydronephrosis Renal ultrasound shows nephrostomy tube intact Plan of care as stated above  *Chronic metastatic breast cancer Oncology input appreciated Plan of care as stated above  *Acute on chronic cancer pain syndrome Plan of care as stated above  *DVT prophylaxis Continue oral Eliquis  *Pleural effusions and fluid overload Stable Continue Lasix Plan of care as stated above  DNR/comfort care going forward with expectant management, plans for home with hospice once equipment in place  All the records are reviewed and case discussed with Care Management/Social Worker. Management plans discussed with the patient, family and they are in agreement.  CODE STATUS: DNR DVT Prophylaxis: SCDs  TOTAL TIME TAKING CARE OF THIS PATIENT: 36 minutes.   POSSIBLE D/C IN 2 to 3 DAYS, DEPENDING ON CLINICAL CONDITION.  Avel Peace Dinna Severs M.D on 04/23/2018 at 10:36 AM  Between 7am to 6pm - Pager - [phone]  After 6pm go to [email or web address] - password [email or web address] [facility]  Office  [phone]  CC: Primary care physician; [name], MD  Note: This dictation was prepared with Dragon dictation along with smaller phrase technology. Any transcriptional errors that result from this process are unintentional.

## 2018-04-23 NOTE — Progress Notes (Signed)
Hospital Bed

## 2018-04-24 ENCOUNTER — Ambulatory Visit: Payer: 59 | Admitting: Hospice and Palliative Medicine

## 2018-04-24 ENCOUNTER — Telehealth: Payer: Self-pay | Admitting: *Deleted

## 2018-04-24 ENCOUNTER — Inpatient Hospital Stay: Payer: 59

## 2018-04-24 ENCOUNTER — Ambulatory Visit: Payer: 59 | Admitting: Urology

## 2018-04-24 ENCOUNTER — Inpatient Hospital Stay: Payer: 59 | Admitting: Oncology

## 2018-04-24 MED ORDER — MORPHINE SULFATE (CONCENTRATE) 10 MG/0.5ML PO SOLN: 5.0000 mg | ORAL | 0 refills | Status: DC | PRN

## 2018-04-24 MED ORDER — ONDANSETRON 4 MG PO TBDP: 4.0000 mg | ORAL_TABLET | Freq: Four times a day (QID) | ORAL | 0 refills | Status: AC | PRN

## 2018-04-24 MED ORDER — LEVETIRACETAM 100 MG/ML PO SOLN: 500.0000 mg | Freq: Two times a day (BID) | ORAL | 0 refills | Status: DC

## 2018-04-24 MED ORDER — ALPRAZOLAM 0.25 MG PO TABS: 0.2500 mg | ORAL_TABLET | Freq: Two times a day (BID) | ORAL | 0 refills | Status: DC | PRN

## 2018-04-24 MED ORDER — MORPHINE SULFATE (CONCENTRATE) 10 MG/0.5ML PO SOLN
10.0000 mg | ORAL | Status: DC | PRN
  Administered 2018-04-24: 20 mg via ORAL
  Administered 2018-04-25: 18:00:00 10 mg via ORAL
  Filled 2018-04-24 (×3): qty 1

## 2018-04-24 NOTE — Progress Notes (Signed)
Follow up visits made through out the day to new referral for hospice services. Patient is more lethargic and continues to require IV dilaudid and IV lorazepam for symptom management. Patient's husband Jenny Reichmann, family and caregiver at bedside. Per discussion with Jenny Reichmann, he does not want to take patient home today, he feels that he is unable to provide adequate symptom management, he is hopeful that patient will be "stronger tomorrow". Emotional support given, referral updated. Hospital ca team aware. Flo Shanks RN, BSN, [facility], hospital Liaison  [phone]

## 2018-04-24 NOTE — Progress Notes (Signed)
Met with patient's husband. Patient appears to be declining. Swallowing was a problem today and nurse had to extract po meds from her mouth. Discharge held until tomorrow due ensure that po meds are sufficient to manage symptoms. Patient requiring frequent dosing of hydromorphone over the past 24 hours. She has been started on morphine elixir. Will liberalize dosing to ensure equivalency between morphine and hydromorphone. Would consider IV hydromorphone infusion at home if needed to control symptoms.  Discussed option of the Hospice Home with family but husband promised patient he would take her home. He wants to honor that wish.   Plan: Increase morphine elixir to 10-'20mg'$  Q1H prn for pain/dyspnea

## 2018-04-24 NOTE — Telephone Encounter (Signed)
Order called

## 2018-04-24 NOTE — Telephone Encounter (Signed)
Yes. I will serve

## 2018-04-24 NOTE — Progress Notes (Signed)
[place]  [phone] [phone]   Name: Mary [name] Date: 04/24/2018 MRN: [id number]  DOB: 08-Nov-1960  Patient Care Team: [name], MD as PCP - General (Internal Medicine) Barrington Ellison, RN as [place] [facility]    REASON FOR CONSULTATION: Palliative Care consult requested for this 58 y.o. female with multiple medical problems including stage IV breast cancer (initially diagnosed in August 2013) status post neoadjuvant chemotherapy and bilateral mastectomy and adjuvant RT.  Patient was found to have disease with recurrence in September 2019 when she developed obstructive nephropathy requiring nephrostomy tubes.  Patient was found to have metastatic disease to bone, bladder wall, and peritoneum.  She was started on Taxol treatment.  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  She is status post Ommaya reservoir (placed on 04/13/2018) with plan to receive intrathecal methotrexate.  PMH also notable for CKD stage II, history of DVT (11/2017) on Eliquis, ascites requiring paracentesis, IBS, anxiety, and depression.  Patient was recently hospitalized 04/07/19/18/20 with lysed weakness and acute kidney injury secondary to dehydration.  Patient's performance status has rapidly declined and she is now bedbound.  She has had transient periods of unresponsiveness and was admitted to the hospital on 04/18/2018 for evaluation.  Palliative care was consulted to help address goals.  CODE STATUS: Full code  PAST MEDICAL HISTORY: Past Medical History:  Diagnosis Date  . Anxiety    takes Xanax prn  . Breast cancer [facility]) August 2013   bilateral, er/pr+, bladder (02/2018..mets from breast ca)  . Chronic kidney disease 01/2018   recent shut down of kidneys with labs out of whack  . Complication of anesthesia    pt  states she had the shakes extremely bad  . Cough    at night d/t reflux;nothing productive  . Dental crowns present    x 4  . Depression   . GERD (gastroesophageal reflux disease)    takes Nexium daily  . History of bronchitis    08/2011  . History of colon polyps   . Hot flashes, menopausal 08/08/2012  . IBS (irritable bowel syndrome)    immodium prn  . Insomnia    takes Ambien nightly  . Irritable bowel syndrome   . 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 Neutropenia, drug-induced ([place]) 12/15/2011  . Nocturia   . Plantar fasciitis   . S/P radiation therapy 07/03/2012 through 08/18/2038                                                        Right chest wall/regional lymph nodes 5040 cGy 28 sessions, right chest wall/mastectomy scar boost 1000 cGy 5 sessions                          . Status post chemotherapy    FEC 100 starting on 12/08/2011 - 01/18/12/single agent Taxol x12 weeks from 02/02/2012 through January 2014   . Urinary frequency   . Use of tamoxifen (Nolvadex)    now stopped. will be starting ibrance on 03/05/18  . Vertigo     PAST SURGICAL HISTORY:  Past Surgical History:  Procedure Laterality Date  . BREAST LUMPECTOMY  2008  left  . COLONOSCOPY    . COLONOSCOPY WITH PROPOFOL N/A 10/10/2017   Procedure: COLONOSCOPY WITH PROPOFOL;  Surgeon: Lucilla Lame, MD;  Location: [facility] ENDOSCOPY;  Service: Endoscopy;  Laterality: N/A;  . CYSTOSCOPY WITH STENT PLACEMENT Bilateral 12/17/2017   Procedure: CYSTOSCOPY WITH STENT PLACEMENT;  Surgeon: Hollice Espy, MD;  Location: [facility] ORS;  Service: Urology;  Laterality: Bilateral;  . IR NEPHROSTOMY EXCHANGE LEFT  02/13/2018  . IR NEPHROSTOMY EXCHANGE RIGHT  01/02/2018  . IR NEPHROSTOMY EXCHANGE RIGHT  02/13/2018  . IR NEPHROSTOMY PLACEMENT LEFT  12/19/2017  . IR NEPHROSTOMY PLACEMENT RIGHT  12/19/2017  . IR NEPHROSTOMY PLACEMENT RIGHT  04/06/2018  . LUMBAR PUNCTURE  03/01/2018  . MASTECTOMY Bilateral 2013  . MASTECTOMY WITH  AXILLARY LYMPH NODE DISSECTION Right 05/18/2012   Procedure: MASTECTOMY WITH AXILLARY LYMPH NODE DISSECTION;  Surgeon: Adin Hector, MD;  Location: [place];  Service: General;  Laterality: Right;  Bilateral Total Mastectomy , right axillary lymph node dissection left axillary sentinel node biopsy, removal of Porta Cath  . NEPHROSTOMY TUBE REMOVAL Bilateral 03/01/2018   removed tubes in dr. Erlene Quan office  . PORT-A-CATH REMOVAL N/A 05/18/2012   Procedure: REMOVAL PORT-A-CATH;  Surgeon: Adin Hector, MD;  Location: [place];  Service: General;  Laterality: N/A;  . PORTA CATH INSERTION N/A 12/26/2017   Procedure: PORTA CATH INSERTION;  Surgeon: Katha Cabal, MD;  Location: [place] CV LAB;  Service: Cardiovascular;  Laterality: N/A;  . PORTACATH PLACEMENT  12/02/2011   Procedure: INSERTION PORT-A-CATH;  Surgeon: Adin Hector, MD;  Location: [place];  Service: General;  Laterality: N/A;  insertion port a cath with fluoroscopy  . SIMPLE MASTECTOMY WITH AXILLARY SENTINEL NODE BIOPSY Left 05/18/2012   Procedure: SIMPLE MASTECTOMY WITH AXILLARY SENTINEL NODE BIOPSY;  Surgeon: Adin Hector, MD;  Location: [place];  Service: General;  Laterality: Left;    HEMATOLOGY/ONCOLOGY HISTORY:    Primary cancer of upper outer quadrant of left female breast ([place])   11/17/2011 Initial Diagnosis    Cancer of upper-outer quadrant of female breast diagnosed when pt presented with Nipple flattening and palpable lesion.    12/08/2011 - 03/29/2012 Chemotherapy    Neo adjuvant FEC X 4 followed by Weekly Taxol X 12    05/18/2012 Surgery    Bilateral Mastectomies: Right revealed invasive  Lobular carcinoma measuring 2.3 cm 8 of 17 lymph nodes were positive (T2 N2A). 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    07/04/2012 - 08/17/2012 Radiation Therapy    Rt Breast irradiation    08/19/2012 -  Anti-estrogen oral therapy    Initially Tamoxifen + Zolodex  and changed to Aromasin + Zolodex Monthly since 08/2013    12/22/2017 -  Chemotherapy    The patient had PACLitaxel (TAXOL) 144 mg in sodium chloride 0.9 % 250 mL chemo infusion (</= '80mg'$ /m2), 80 mg/m2 = 144 mg, Intravenous,  Once, 3 of 4 cycles Administration: 144 mg (12/22/2017), 144 mg (12/29/2017), 144 mg (01/05/2018), 144 mg (01/19/2018), 144 mg (01/26/2018), 144 mg (02/02/2018), 144 mg (04/05/2018), 144 mg (04/17/2018)  for chemotherapy treatment.     04/20/2018 -  Chemotherapy    The patient had methotrexate (PF) 12 mg in sodium chloride 0.9 % INTRATHECAL chemo injection, , Intrathecal,  Once, 0 of 6 cycles  for chemotherapy treatment.      Metastatic breast cancer ([place])   12/19/2017 Initial Diagnosis    Metastatic breast cancer ([place])  12/22/2017 -  Chemotherapy    The patient had PACLitaxel (TAXOL) 144 mg in sodium chloride 0.9 % 250 mL chemo infusion (</= 80mg /m2), 80 mg/m2 = 144 mg, Intravenous,  Once, 3 of 4 cycles Administration: 144 mg (12/22/2017), 144 mg (12/29/2017), 144 mg (01/05/2018), 144 mg (01/19/2018), 144 mg (01/26/2018), 144 mg (02/02/2018), 144 mg (04/05/2018), 144 mg (04/17/2018)  for chemotherapy treatment.     04/20/2018 -  Chemotherapy    The patient had methotrexate (PF) 12 mg in sodium chloride 0.9 % INTRATHECAL chemo injection, , Intrathecal,  Once, 0 of 6 cycles  for chemotherapy treatment.      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      ALLERGIES:  is allergic to adhesive [tape] and albumin (human).  MEDICATIONS:  Current Facility-Administered Medications  Medication Dose Route Frequency Provider Last Rate Last Dose  . 0.9 %  sodium chloride infusion  250 mL Intravenous PRN Salary, Montell D, MD      . acetaminophen (TYLENOL) tablet 650 mg  650 mg Oral Q6H PRN Fritzi Mandes, MD       Or  . acetaminophen (TYLENOL) suppository 650 mg  650 mg Rectal Q6H PRN Fritzi Mandes, MD      . ALPRAZolam Duanne Moron) tablet 0.25 mg  0.25 mg Oral BID PRN Fritzi Mandes, MD   0.25 mg at 04/23/18 0254  . antiseptic oral rinse (BIOTENE) solution 15 mL  15 mL Topical PRN Salary, Montell D, MD      . apixaban (ELIQUIS) tablet 2.5 mg  2.5 mg Oral BID Fritzi Mandes, MD   2.5 mg at 04/24/18 0912  . chlorproMAZINE (THORAZINE) 12.5 mg in sodium chloride 0.9 % 25 mL IVPB  12.5 mg Intravenous Q6H PRN Salary, Montell D, MD      . dexamethasone (DECADRON) tablet 4 mg  4 mg Oral Q12H Brantleigh Mifflin, Kirt Boys, NP   4 mg at 04/24/18 0913  . diazepam (VALIUM) tablet 10 mg  10 mg Oral QHS PRN Fritzi Mandes, MD   10 mg at 04/22/18 2209  . diphenhydrAMINE (BENADRYL) injection 25 mg  25 mg Intravenous Q6H PRN Loney Hering D, MD   25 mg at 04/22/18 2209  . dronabinol (MARINOL) capsule 5 mg  5 mg Oral BID AC Fritzi Mandes, MD   5 mg at 04/23/18 1708  . furosemide (LASIX) injection 40 mg  40 mg Intravenous Daily Pyreddy, Reatha Harps, MD   40 mg at 04/24/18 0912  . haloperidol (HALDOL) tablet 0.5 mg  0.5 mg Oral Q4H PRN Salary, Montell D, MD       Or  . haloperidol (HALDOL) 2 MG/ML solution 0.5 mg  0.5 mg Sublingual Q4H PRN Salary, Montell D, MD       Or  . haloperidol lactate (HALDOL) injection 0.5 mg  0.5 mg Intravenous Q4H PRN  Salary, Montell D, MD      . HYDROmorphone (DILAUDID) injection 0.5 mg  0.5 mg Intravenous Q2H PRN Salary, Montell D, MD   0.5 mg at 04/24/18 1100  .  levETIRAcetam (KEPPRA) 100 MG/ML solution 500 mg  500 mg Oral BID Alexis Goodell, MD   500 mg at 04/24/18 0913  . loperamide (IMODIUM) capsule 2 mg  2 mg Oral QID PRN Fritzi Mandes, MD      . LORazepam (ATIVAN) tablet 1 mg  1 mg Oral Q4H PRN Salary, Montell D, MD   1 mg at 04/23/18 1353   Or  . LORazepam (ATIVAN) 2 MG/ML concentrated solution 1 mg  1 mg Sublingual Q4H PRN Salary, Avel Peace, MD       Or  . LORazepam (ATIVAN) injection 1 mg  1 mg Intravenous Q4H PRN Salary, Montell D, MD   1 mg at 04/24/18 1101  . magic mouthwash  15 mL Oral Q6H PRN Salary, Montell D, MD      . morphine CONCENTRATE 10 MG/0.5ML oral solution 5 mg  5 mg Oral Q2H PRN Salary, Montell D, MD   5 mg at 04/23/18 0734   Or  . morphine CONCENTRATE 10 MG/0.5ML oral solution 5 mg  5 mg Sublingual Q2H PRN Salary, Montell D, MD      . naloxegol oxalate (MOVANTIK) tablet 25 mg  25 mg Oral Daily Aadan Chenier, Kirt Boys, NP   25 mg at 04/24/18 0913  . nystatin (MYCOSTATIN/NYSTOP) topical powder   Topical TID PRN Salary, Montell D, MD      . ondansetron (ZOFRAN-ODT) disintegrating tablet 4 mg  4 mg Oral Q6H PRN Salary, Montell D, MD       Or  . ondansetron (ZOFRAN) injection 4 mg  4 mg Intravenous Q6H PRN Salary, Montell D, MD      . oxybutynin (DITROPAN) tablet 2.5 mg  2.5 mg Oral QID PRN Salary, Montell D, MD      . polyvinyl alcohol (LIQUIFILM TEARS) 1.4 % ophthalmic solution 1 drop  1 drop Both Eyes QID PRN Salary, Montell D, MD      . prochlorperazine (COMPAZINE) tablet 10 mg  10 mg Oral Q6H PRN Fritzi Mandes, MD      . promethazine (PHENERGAN) injection 12.5 mg  12.5 mg Intravenous Q6H PRN Saundra Shelling, MD   12.5 mg at 04/23/18 2003  . scopolamine (TRANSDERM-SCOP) 1 MG/3DAYS 1.5 mg  1 patch Transdermal Q72H Fritzi Mandes, MD   1.5 mg at 04/21/18 2355  . sodium chloride flush (NS) 0.9 % injection 3 mL  3 mL Intravenous Q12H Salary, Montell D, MD   3 mL at 04/24/18 0913  . sodium chloride flush (NS) 0.9 % injection 3 mL  3 mL  Intravenous PRN Salary, Holly Bodily D, MD   3 mL at 04/23/18 0910  . sodium phosphate (FLEET) 7-19 GM/118ML enema 1 enema  1 enema Rectal Daily PRN Saundra Shelling, MD   1 enema at 04/20/18 1130  . traZODone (DESYREL) tablet 25 mg  25 mg Oral QHS PRN Salary, Montell D, MD      . triamcinolone cream (KENALOG) 0.1 %   Topical BID PRN Salary, Montell D, MD        VITAL SIGNS: BP 132/88 (BP Location: Left Arm)   Pulse 81   Temp (!) 97.4 F (36.3 C) (Oral)   Resp 18   Ht 5\' 5"  (1.651 m)   Wt 128 lb 6.4 oz (58.2 kg)   LMP 11/28/2011  SpO2 93%   BMI 21.37 kg/m  Filed Weights   04/18/18 1500 04/22/18 0633  Weight: 146 lb (66.2 kg) 128 lb 6.4 oz (58.2 kg)    Estimated body mass index is 21.37 kg/m as calculated from the following:   Height as of this encounter: 5\' 5"  (1.651 m).   Weight as of this encounter: 128 lb 6.4 oz (58.2 kg).  LABS: CBC:    Component Value Date/Time   WBC 7.1 04/21/2018 0543   HGB 10.7 (L) 04/21/2018 0543   HGB 14.8 04/29/2014 1327   HCT 32.6 (L) 04/21/2018 0543   HCT 43.8 04/29/2014 1327   PLT 425 (H) 04/21/2018 0543   PLT 206 04/29/2014 1327   MCV 91.1 04/21/2018 0543   MCV 95.0 04/29/2014 1327   NEUTROABS 3.0 04/17/2018 0815   NEUTROABS 3.9 04/29/2014 1327   LYMPHSABS 0.6 (L) 04/17/2018 0815   LYMPHSABS 1.7 04/29/2014 1327   MONOABS 0.4 04/17/2018 0815   MONOABS 0.6 04/29/2014 1327   EOSABS 0.0 04/17/2018 0815   EOSABS 0.1 04/29/2014 1327   BASOSABS 0.1 04/17/2018 0815   BASOSABS 0.0 04/29/2014 1327   Comprehensive Metabolic Panel:    Component Value Date/Time   NA 134 (L) 04/21/2018 0543   NA 138 12/16/2017   NA 141 04/29/2014 1327   K 4.6 04/21/2018 0543   K 4.0 04/29/2014 1327   CL 96 (L) 04/21/2018 0543   CL 106 08/20/2012 0802   CO2 30 04/21/2018 0543   CO2 24 04/29/2014 1327   BUN 18 04/21/2018 0543   BUN 30 (A) 12/16/2017   BUN 15.7 04/29/2014 1327   CREATININE 0.81 04/21/2018 0543   CREATININE 1.58 (H) 12/15/2017 1525    CREATININE 0.9 04/29/2014 1327   GLUCOSE 150 (H) 04/21/2018 0543   GLUCOSE 97 04/29/2014 1327   GLUCOSE 100 (H) 08/20/2012 0802   CALCIUM 8.4 (L) 04/21/2018 0543   CALCIUM 9.7 04/29/2014 1327   AST 72 (H) 04/18/2018 1503   AST 21 04/29/2014 1327   ALT 40 04/18/2018 1503   ALT 30 04/29/2014 1327   ALKPHOS 175 (H) 04/18/2018 1503   ALKPHOS 124 04/29/2014 1327   BILITOT 0.3 04/18/2018 1503   BILITOT 0.34 04/29/2014 1327   PROT 6.4 (L) 04/18/2018 1503   PROT 7.6 04/29/2014 1327   ALBUMIN 2.9 (L) 04/18/2018 1503   ALBUMIN 4.1 04/29/2014 1327    RADIOGRAPHIC STUDIES: Ct Head Wo Contrast  Result Date: 04/18/2018 CLINICAL DATA:  Syncopal episode, seizures, VP shunt EXAM: CT HEAD WITHOUT CONTRAST TECHNIQUE: Contiguous axial images were obtained from the base of the skull through the vertex without intravenous contrast. COMPARISON:  03/05/2018 MRI FINDINGS: Brain: Right frontal shunt tubing terminates in right lateral ventricle close to midline. There is diffuse enlargement of the ventricular system, difficult to exclude mild hydrocephalus or shunt malfunction. Ventricles remain enlarged and similar to the 03/05/2018 MR. Mild periventricular white matter microvascular ischemic changes. No acute intracranial hemorrhage, mass lesion, definite infarction, midline shift, herniation, extra-axial fluid collection. No focal mass effect or edema. Cisterns are patent. Cerebellar atrophy as well. Vascular: No hyperdense vessel or unexpected calcification. Skull: Normal. Negative for fracture or focal lesion. Sinuses/Orbits: No acute finding. Other: None. IMPRESSION: Interval right frontal shunt insertion. Similar diffuse ventricular enlargement of the entire ventricular system. Difficult to exclude hydrocephalus or shunt malfunction. Minor white matter microvascular changes Stable brain atrophy pattern. No other acute intracranial abnormality or hemorrhage. Electronically Signed   By: Jerilynn Mages.  Shick M.D.   On:  04/18/2018 20:20   Ct Chest Wo Contrast  Result Date: 04/04/2018 CLINICAL DATA:  Metastatic breast cancer. Status post bilateral mastectomy. EXAM: CT CHEST, ABDOMEN AND PELVIS WITHOUT CONTRAST TECHNIQUE: Multidetector CT imaging of the chest, abdomen and pelvis was performed following the standard protocol without IV contrast. COMPARISON:  02/06/2018 FINDINGS: CT CHEST FINDINGS Cardiovascular: The heart size is normal. No substantial pericardial effusion. Right Port-A-Cath tip is at the SVC/RA junction. Mediastinum/Nodes: No mediastinal lymphadenopathy. There is no hilar lymphadenopathy. Esophagus appears diffusely fluid-filled. There is no axillary lymphadenopathy. Lungs/Pleura: The central tracheobronchial airways are patent. Stable appearance of apparent scarring in the right apex. Mild atelectasis in the right middle and lower lobes is new in the interval. There is a new small right pleural effusion. No suspicious nodule or mass in the left lung. Minimal atelectasis noted left lower lobe. Tiny left pleural effusion evident. Musculoskeletal: Stable similar diffuse heterogeneous bone mineralization CT ABDOMEN PELVIS FINDINGS Hepatobiliary: No focal abnormality in the liver parenchyma. High attenuation material in the gallbladder lumen may be related to stones or sludge. No intrahepatic or extrahepatic biliary dilation. Pancreas: No focal mass lesion. No dilatation of the main duct. No intraparenchymal cyst. No peripancreatic edema. Spleen: No splenomegaly. No focal mass lesion. Adrenals/Urinary Tract: Similar to the thickening of the left adrenal gland. Thickening of the right adrenal gland is also similar to prior. Bilateral percutaneous nephrostomy tubes been removed in the interval. There is mild to moderate right hydronephrosis with mild fullness of the left intrarenal collecting system. Neither ureter is distended beyond its midpoint. Urinary bladder is decompressed. Stomach/Bowel: Stomach is  nondistended. No gastric wall thickening. No evidence of outlet obstruction. Duodenum is normally positioned as is the ligament of Treitz. No small bowel wall thickening. No small bowel dilatation. The terminal ileum is normal. The appendix is not visualized, but there is no edema or inflammation in the region of the cecum. Colon is diffusely decompressed. Vascular/Lymphatic: No abdominal aortic aneurysm. No retroperitoneal lymphadenopathy. No pelvic sidewall lymphadenopathy. Reproductive: But The uterus is unremarkable. There is no adnexal mass. Other: Moderate to large volume ascites is new in the interval. There is vascular congestion/nodularity in the gastrocolic ligament and omentum suggestion of peritoneal thickening seen in the left pelvis (96/2) and posterior pelvis bilaterally (102/2). Musculoskeletal: Similar appearance of heterogeneous bone mineralization with some more densely sclerotic lesions identified throughout as examples, see L5 vertebral body (84/2) left pubic bone (112/2) and posterior left inferior pubic ramus (118/2). IMPRESSION: 1. Interval development of moderate to large volume ascites with congestion and nodularity in the gastrocolic ligament and omentum. Imaging features highly concerning for metastatic involvement. 2. Interval removal of percutaneous nephrostomy tubes with mild to moderate right hydronephrosis and mild fullness of the left intrarenal collecting system. 3. Diffuse heterogeneous bone mineralization with scattered sclerotic lesion similar to prior and compatible with metastatic disease. 4. Small right and tiny left pleural effusions. 5. Urinary bladder decompressed on today's study and not well evaluated. Electronically Signed   By: Misty Stanley M.D.   On: 04/04/2018 11:55   US Renal  Result Date: 04/19/2018 CLINICAL DATA:  Nephrostomy status. EXAM: RENAL / URINARY TRACT ULTRASOUND COMPLETE COMPARISON:  None. FINDINGS: Right Kidney: Renal measurements: 11.6 x 5 x 4.3  cm = volume: 129 mL . Echogenicity within normal limits. Mild hydronephrosis is noted. Nephrostomy tube is present. Left Kidney: Renal measurements: 11.3 x 5.3 x 3.9 cm = volume: 121 mL. Echogenicity within normal limits. Moderate hydronephrosis is noted Bladder: Appears normal for  degree of bladder distention. No ureteral jets are noted. Ascites are noted in the abdomen pelvis. Bilateral pleural effusions are noted. IMPRESSION: Right nephrostomy tube is identified. There is mild right hydronephrosis. Moderate left hydronephrosis is noted. Ascites identified in the abdomen and pelvis. Bilateral pleural effusions. Electronically Signed   By: Abelardo Diesel M.D.   On: 04/19/2018 11:35   US Renal  Result Date: 04/03/2018 CLINICAL DATA:  Acute kidney injury. EXAM: RENAL / URINARY TRACT ULTRASOUND COMPLETE COMPARISON:  CT of the abdomen and pelvis 02/06/2018 FINDINGS: Right Kidney: Renal measurements: 12.7 x 4.7 x 5.7 cm = volume: 171 mL. Moderate to severe recurrent right-sided hydronephrosis is present. No obstructing lesion or mass is present. Moderate renal thinning is present. Left Kidney: Renal measurements: 11.5 x 4.3 x 5.0 cm = volume: 129 mL. Moderate left-sided hydronephrosis is present. Parenchymal thickness is normal. Bladder: Under distended. Moderate diffuse abdominal ascites is present. Bilateral pleural effusions are noted. IMPRESSION: 1. Recurrent bilateral hydronephrosis, right greater than left. 2. Moderate diffuse abdominal ascites. 3. Bilateral pleural effusions. Electronically Signed   By: San Morelle M.D.   On: 04/03/2018 16:48   US Paracentesis  Result Date: 04/20/2018 INDICATION: Patient with history of metastatic breast carcinoma with recurrent malignant ascites. Request made for therapeutic paracentesis. EXAM: ULTRASOUND GUIDED THERAPEUTIC PARACENTESIS MEDICATIONS: None COMPLICATIONS: None immediate. PROCEDURE: Informed written consent was obtained from the patient after a  discussion of the risks, benefits and alternatives to treatment. A timeout was performed prior to the initiation of the procedure. Initial ultrasound scanning demonstrates a moderate to large amount of ascites within the left lower abdominal quadrant. The left lower abdomen was prepped and draped in the usual sterile fashion. 1% lidocaine was used for local anesthesia. Following this, a 6 Fr Safe-T-Centesis catheter was introduced. An ultrasound image was saved for documentation purposes. The paracentesis was performed. The catheter was removed and a dressing was applied. The patient tolerated the procedure well without immediate post procedural complication. FINDINGS: A total of approximately 3.2 liters of slightly hazy, yellow fluid was removed. IMPRESSION: Successful ultrasound-guided therapeutic paracentesis yielding 3.2 liters of peritoneal fluid. Read by: Rowe Robert, PA-C Electronically Signed   By: Jerilynn Mages.  Shick M.D.   On: 04/20/2018 10:58   US Paracentesis  Result Date: 04/06/2018 INDICATION: Metastatic breast cancer, ascites, abdominal distension EXAM: ULTRASOUND GUIDED RIGHT PARACENTESIS MEDICATIONS: 1% LIDOCAINE LOCAL COMPLICATIONS: None immediate. PROCEDURE: Informed written consent was obtained from the patient after a discussion of the risks, benefits and alternatives to treatment. A timeout was performed prior to the initiation of the procedure. Initial ultrasound scanning demonstrates a large amount of ascites within the right lower abdominal quadrant. The right lower abdomen was prepped and draped in the usual sterile fashion. 1% lidocaine with epinephrine was used for local anesthesia. Following this, a 6 Fr Safe-T-Centesis catheter was introduced. An ultrasound image was saved for documentation purposes. The paracentesis was performed. The catheter was removed and a dressing was applied. The patient tolerated the procedure well without immediate post procedural complication. FINDINGS: A total of  approximately 4.2 L of PERITONEAL fluid was removed. Samples were sent to the laboratory as requested by the clinical team. IMPRESSION: Successful ultrasound-guided paracentesis yielding 4.2 liters of peritoneal fluid. Electronically Signed   By: Jerilynn Mages.  Shick M.D.   On: 04/06/2018 11:19   Dg Abd 2 Views  Result Date: 04/18/2018 CLINICAL DATA:  Initial evaluation for constipation. EXAM: ABDOMEN - 2 VIEW COMPARISON:  None. FINDINGS: Paucity of gas limits evaluation  of the bowels. No evidence for obstruction. No free air on upright view. No abnormal bowel wall thickening. Moderate retained stool within the colon, most notable at the splenic flexure. No soft tissue mass or abnormal calcification. Right-sided percutaneous nephrostomy tube in place. No acute osseous abnormality. Right basilar atelectasis noted. Right-sided Port-A-Cath noted as well. IMPRESSION: Nonobstructive bowel gas pattern with moderate retained stool within the colon. Electronically Signed   By: Jeannine Boga M.D.   On: 04/18/2018 17:44   Ct Renal Stone Study  Result Date: 04/04/2018 CLINICAL DATA:  Metastatic breast cancer. Status post bilateral mastectomy. EXAM: CT CHEST, ABDOMEN AND PELVIS WITHOUT CONTRAST TECHNIQUE: Multidetector CT imaging of the chest, abdomen and pelvis was performed following the standard protocol without IV contrast. COMPARISON:  02/06/2018 FINDINGS: CT CHEST FINDINGS Cardiovascular: The heart size is normal. No substantial pericardial effusion. Right Port-A-Cath tip is at the SVC/RA junction. Mediastinum/Nodes: No mediastinal lymphadenopathy. There is no hilar lymphadenopathy. Esophagus appears diffusely fluid-filled. There is no axillary lymphadenopathy. Lungs/Pleura: The central tracheobronchial airways are patent. Stable appearance of apparent scarring in the right apex. Mild atelectasis in the right middle and lower lobes is new in the interval. There is a new small right pleural effusion. No suspicious  nodule or mass in the left lung. Minimal atelectasis noted left lower lobe. Tiny left pleural effusion evident. Musculoskeletal: Stable similar diffuse heterogeneous bone mineralization CT ABDOMEN PELVIS FINDINGS Hepatobiliary: No focal abnormality in the liver parenchyma. High attenuation material in the gallbladder lumen may be related to stones or sludge. No intrahepatic or extrahepatic biliary dilation. Pancreas: No focal mass lesion. No dilatation of the main duct. No intraparenchymal cyst. No peripancreatic edema. Spleen: No splenomegaly. No focal mass lesion. Adrenals/Urinary Tract: Similar to the thickening of the left adrenal gland. Thickening of the right adrenal gland is also similar to prior. Bilateral percutaneous nephrostomy tubes been removed in the interval. There is mild to moderate right hydronephrosis with mild fullness of the left intrarenal collecting system. Neither ureter is distended beyond its midpoint. Urinary bladder is decompressed. Stomach/Bowel: Stomach is nondistended. No gastric wall thickening. No evidence of outlet obstruction. Duodenum is normally positioned as is the ligament of Treitz. No small bowel wall thickening. No small bowel dilatation. The terminal ileum is normal. The appendix is not visualized, but there is no edema or inflammation in the region of the cecum. Colon is diffusely decompressed. Vascular/Lymphatic: No abdominal aortic aneurysm. No retroperitoneal lymphadenopathy. No pelvic sidewall lymphadenopathy. Reproductive: But The uterus is unremarkable. There is no adnexal mass. Other: Moderate to large volume ascites is new in the interval. There is vascular congestion/nodularity in the gastrocolic ligament and omentum suggestion of peritoneal thickening seen in the left pelvis (96/2) and posterior pelvis bilaterally (102/2). Musculoskeletal: Similar appearance of heterogeneous bone mineralization with some more densely sclerotic lesions identified throughout as  examples, see L5 vertebral body (84/2) left pubic bone (112/2) and posterior left inferior pubic ramus (118/2). IMPRESSION: 1. Interval development of moderate to large volume ascites with congestion and nodularity in the gastrocolic ligament and omentum. Imaging features highly concerning for metastatic involvement. 2. Interval removal of percutaneous nephrostomy tubes with mild to moderate right hydronephrosis and mild fullness of the left intrarenal collecting system. 3. Diffuse heterogeneous bone mineralization with scattered sclerotic lesion similar to prior and compatible with metastatic disease. 4. Small right and tiny left pleural effusions. 5. Urinary bladder decompressed on today's study and not well evaluated. Electronically Signed   By: Verda Cumins.D.  On: 04/04/2018 11:55   Ir Nephrostomy Placement Right  Result Date: 04/06/2018 INDICATION: Metastatic breast cancer, recurrent right hydronephrosis EXAM: ULTRASOUND AND FLUOROSCOPIC RIGHT NEPHROSTOMY INSERTION COMPARISON:  04/04/2018 MEDICATIONS: CIPRO 400 MG; The antibiotic was administered in an appropriate time frame prior to skin puncture. ANESTHESIA/SEDATION: Fentanyl 50 mcg IV; Versed 2 mg IV Moderate Sedation Time:  11 minutes The patient was continuously monitored during the procedure by the interventional radiology nurse under my direct supervision. CONTRAST:  10 cc-administered into the collecting system(s) FLUOROSCOPY TIME:  Fluoroscopy Time: 0 minutes 30 seconds ( mGy). COMPLICATIONS: None immediate. PROCEDURE: Informed written consent was obtained from the patient after a thorough discussion of the procedural risks, benefits and alternatives. All questions were addressed. Maximal Sterile Barrier Technique was utilized including caps, mask, sterile gowns, sterile gloves, sterile drape, hand hygiene and skin antiseptic. A timeout was performed prior to the initiation of the procedure. Previous imaging reviewed. Patient position prone.  Right hydronephrotic kidney was localized and marked. Under sterile conditions anesthesia, an 18 gauge introducer needle was advanced into a dilated mid to lower pole calyx. Needle position confirmed with ultrasound. Images obtained for documentation. There was return of urine. Guidewire inserted followed by tract dilatation to insert a 10 French nephrostomy. Retention loop formed the renal pelvis. Contrast injection confirms position. Images obtained for documentation. Catheter secured with a prolene suture and a sterile dressing. Gravity drainage bag connected. No immediate complication. Patient tolerated the procedure well. IMPRESSION: Successful ultrasound and fluoroscopic 10 French right nephrostomy insertion Electronically Signed   By: Jerilynn Mages.  Shick M.D.   On: 04/06/2018 12:05    PERFORMANCE STATUS (ECOG) : 4 - Bedbound  Review of Systems As noted above. Otherwise, a complete review of systems is negative.  Physical Exam General: ill appearing Cardiovascular: regular rate and rhythm Pulmonary: clear ant fields Abdomen: full, firm, nttp GU: no suprapubic tenderness Extremities: no edema, no joint deformities Skin: no rashes Neurological: Weakness, alert and oriented  IMPRESSION: Patient currently sleeping. Spoke with sitter who is at bedside. Patient continues to receive frequent dosing of hydromorphone and lorazepam. She appears to be comfortable at present. Oral intake is poor. Plan is for her to go home with hospice care when equipment is arranged in the home.   PLAN: 1.  Home with hospice 2. Comfort measures 3.  DNR  Time Total: 20 minutes  Visit consisted of counseling and education dealing with the complex and emotionally intense issues of symptom management and palliative care in the setting of serious and potentially life-threatening illness.Greater than 50%  of this time was spent counseling and coordinating care related to the above assessment and plan.  Signed by: Altha Harm, PhD, NP-C [phone] (Work Cell)

## 2018-04-24 NOTE — Telephone Encounter (Signed)
Patient discharging from hospital today and hospice referral received, Asking if Dr Tasia Catchings will service as attending physician. Please advise

## 2018-04-24 NOTE — Discharge Summary (Signed)
[facility] NAME: Mary [name]    MR#:  [id number]  DATE OF BIRTH:  1960/12/28  DATE OF ADMISSION:  04/18/2018 ADMITTING PHYSICIAN: Fritzi Mandes, MD  DATE OF DISCHARGE: No discharge date for patient encounter.  PRIMARY CARE PHYSICIAN: [name], MD    ADMISSION DIAGNOSIS:  Nephrostomy status ([place]) [Z93.6] Constipation [K59.00] Syncope, unspecified syncope type [R55]  DISCHARGE DIAGNOSIS:  Active Problems:   Altered mental status   Constipation   Palliative care encounter   Neoplasm related pain   Malignant ascites   Nephrostomy status (HCC)   Abdominal carcinomatosis (HCC)   Syncope   Leptomeningeal disease   SECONDARY DIAGNOSIS:   Past Medical History:  Diagnosis Date  . Anxiety    takes Xanax prn  . Breast cancer [facility]) August 2013   bilateral, er/pr+, bladder (02/2018..mets from breast ca)  . Chronic kidney disease 01/2018   recent shut down of kidneys with labs out of whack  . Complication of anesthesia    pt states she had the shakes extremely bad  . Cough    at night d/t reflux;nothing productive  . Dental crowns present    x 4  . Depression   . GERD (gastroesophageal reflux disease)    takes Nexium daily  . History of bronchitis    08/2011  . History of colon polyps   . Hot flashes, menopausal 08/08/2012  . IBS (irritable bowel syndrome)    immodium prn  . Insomnia    takes Ambien nightly  . Irritable bowel syndrome   . 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 Neutropenia, drug-induced ([place]) 12/15/2011  . Nocturia   . Plantar fasciitis   . S/P radiation therapy 07/03/2012 through 08/18/2038                                                        Right chest wall/regional lymph nodes 5040 cGy 28 sessions, right chest wall/mastectomy scar boost 1000 cGy 5 sessions                          . Status post chemotherapy    FEC 100 starting on 12/08/2011 - 01/18/12/single agent Taxol x12  weeks from 02/02/2012 through January 2014   . Urinary frequency   . Use of tamoxifen (Nolvadex)    now stopped. will be starting ibrance on 03/05/18  . Vertigo     HOSPITAL COURSE:  58 year old female patient with history of metastatic breast cancer with meningeal mets currently had a recent placement of Ommaya reservoir by neurosurgery.  Due for next chemotherapy on Friday through the reservoir  *Intermittent loss of consciousness stable Could be subtle seizures Discussed with neurology-continue Keppra for 1 month, EEG was normal, CT head negative  In discussion with the patient and the patient's husband with caregivers present-patient made herself DNR/comfort care going forward given terminal prognosis -continue expectant management with comfort care measures, home with hospice  *Acute malignant ascites Resolved Related breast cancer Status post paracentesis with 3.2 L of fluid drained off April 20, 2018 Plan of care as stated above  *Chronic anxiety disorder Plan of care as stated above  *History of nephrostomy tube secondary to right-sided hydronephrosis Renal ultrasound shows nephrostomy tube intact Plan of care  as stated above  *Chronic metastatic breast cancer Oncology input appreciated Plan of care as stated above  *Acute on chronic cancer pain syndrome Plan of care as stated above  *DVT prophylaxis Continue oral Eliquis  *Pleural effusions and fluid overload Stable Continued Lasix Plan of care as stated above  DNR/comfort care going forward with expectant management, plans for home with hospice  DISCHARGE CONDITIONS:   terminal  CONSULTS OBTAINED:  Treatment Team:  Lahoma Crocker, MD  DRUG ALLERGIES:   Allergies  Allergen Reactions  . Adhesive [Tape] Other (See Comments)    Redness:Please use paper tape  . Albumin (Human) Swelling and Rash    DISCHARGE MEDICATIONS:   Allergies as of 04/24/2018      Reactions   Adhesive [tape]  Other (See Comments)   Redness:Please use paper tape   Albumin (human) Swelling, Rash      Medication List    STOP taking these medications   amLODipine 5 MG tablet Commonly known as:  NORVASC   CALTRATE 600+D PO   diazepam 10 MG tablet Commonly known as:  VALIUM   esomeprazole 40 MG capsule Commonly known as:  NEXIUM   FASLODEX 250 MG/5ML injection Generic drug:  fulvestrant   ferrous sulfate 325 (65 FE) MG EC tablet   furosemide 20 MG tablet Commonly known as:  LASIX   goserelin 3.6 MG injection Commonly known as:  ZOLADEX   lidocaine-prilocaine cream Commonly known as:  EMLA   loperamide 2 MG tablet Commonly known as:  IMODIUM A-D   multivitamin tablet   ondansetron 4 MG tablet Commonly known as:  ZOFRAN   vitamin C 100 MG tablet     TAKE these medications   ALPRAZolam 0.25 MG tablet Commonly known as:  XANAX Take 1 tablet (0.25 mg total) by mouth 2 (two) times daily as needed for anxiety. What changed:    reasons to take this  additional instructions   apixaban 2.5 MG Tabs tablet Commonly known as:  ELIQUIS Take 1 tablet (2.5 mg total) by mouth 2 (two) times daily.   dronabinol 5 MG capsule Commonly known as:  MARINOL Take 1 capsule (5 mg total) by mouth 2 (two) times daily before lunch and supper.   levETIRAcetam 100 MG/ML solution Commonly known as:  KEPPRA Take 5 mLs (500 mg total) by mouth 2 (two) times daily.   morphine CONCENTRATE 10 MG/0.5ML Soln concentrated solution Take 0.25 mLs (5 mg total) by mouth every 2 (two) hours as needed for moderate pain (or dyspnea).   ondansetron 4 MG disintegrating tablet Commonly known as:  ZOFRAN-ODT Take 1 tablet (4 mg total) by mouth every 6 (six) hours as needed for nausea.   oxyCODONE 10 mg 12 hr tablet Commonly known as:  OXYCONTIN Take 1 tablet (10 mg total) by mouth every 12 (twelve) hours. What changed:  Another medication with the same name was removed. Continue taking this medication,  and follow the directions you see here.   prochlorperazine 10 MG tablet Commonly known as:  COMPAZINE Take 1 tablet (10 mg total) by mouth every 6 (six) hours as needed (Nausea or vomiting).   scopolamine 1 MG/3DAYS Commonly known as:  TRANSDERM-SCOP Place 1 patch (1.5 mg total) onto the skin every 3 (three) days.   senna 8.6 MG Tabs tablet Commonly known as:  SENOKOT Take 2 tablets (17.2 mg total) by mouth daily.            Durable Medical Equipment  (From admission, onward)  Start     Ordered   04/20/18 1338  For home use only DME Hospital bed  Once    Question Answer Comment  The above medical condition requires: Patient requires the ability to reposition frequently   Head must be elevated greater than: 45 degrees   Bed type Semi-electric      04/20/18 1338           DISCHARGE INSTRUCTIONS:      If you experience worsening of your admission symptoms, develop shortness of breath, life threatening emergency, suicidal or homicidal thoughts you must seek medical attention immediately by calling 911 or calling your MD immediately  if symptoms less severe.  You Must read complete instructions/literature along with all the possible adverse reactions/side effects for all the Medicines you take and that have been prescribed to you. Take any new Medicines after you have completely understood and accept all the possible adverse reactions/side effects.   Please note  You were cared for by a hospitalist during your hospital stay. If you have any questions about your discharge medications or the care you received while you were in the hospital after you are discharged, you can call the unit and asked to speak with the hospitalist on call if the hospitalist that took care of you is not available. Once you are discharged, your primary care physician will handle any further medical issues. Please note that NO REFILLS for any discharge medications will be authorized once you  are discharged, as it is imperative that you return to your primary care physician (or establish a relationship with a primary care physician if you do not have one) for your aftercare needs so that they can reassess your need for medications and monitor your lab values.    Today   CHIEF COMPLAINT:   Chief Complaint  Patient presents with  . Loss of Consciousness    HISTORY OF PRESENT ILLNESS:   58 y.o. female with a known history of metastatic breast cancer with little meningeal meds recently had placement of Ommaya reservoir [place] neurosurgery April 13, 2017. Patient will get her next chemo on Friday through that reservoir . She follows with Dr. Tasia Catchings the cancer center. Patient has been having episodes of blanking out spells with intermittent loss of consciousness. She is not had any fall. She does not walk. She has caregivers at home according to the husband in the room. CT head was done which is essentially stable from previous CT scan. ER physician spoke with [facility]surgery at [facility] and they recommend patient be admitted for rule out possible seizure if CT head is negative.  No Witnessed seizures. Patient has not had EEG. She is currently awake alert and conversation with me.  Patient is being admitted for further evaluation of management.   VITAL SIGNS:  Blood pressure 132/88, pulse 81, temperature (!) 97.4 F (36.3 C), temperature source Oral, resp. rate 18, height 5\' 5"  (1.651 m), weight 58.2 kg, last menstrual period 11/28/2011, SpO2 93 %.  I/O:    Intake/Output Summary (Last 24 hours) at 04/24/2018 1011 Last data filed at 04/24/2018 0612 Gross per 24 hour  Intake 180 ml  Output 400 ml  Net -220 ml    PHYSICAL EXAMINATION:  GENERAL:  58 y.o.-year-old patient lying in the bed with no acute distress.  EYES: Pupils equal, round, reactive to light and accommodation. No scleral icterus. Extraocular muscles intact.  HEENT: Head atraumatic, normocephalic. Oropharynx and nasopharynx  clear.  NECK:  Supple, no jugular venous  distention. No thyroid enlargement, no tenderness.  LUNGS: Normal breath sounds bilaterally, no wheezing, rales,rhonchi or crepitation. No use of accessory muscles of respiration.  CARDIOVASCULAR: S1, S2 normal. No murmurs, rubs, or gallops.  ABDOMEN: Soft, non-tender, non-distended. Bowel sounds present. No organomegaly or mass.  EXTREMITIES: No pedal edema, cyanosis, or clubbing.  NEUROLOGIC: Cranial nerves II through XII are intact. Muscle strength 5/5 in all extremities. Sensation intact. Gait not checked.  PSYCHIATRIC: The patient is alert and oriented x 3.  SKIN: No obvious rash, lesion, or ulcer.   DATA REVIEW:   CBC Recent Labs  Lab 04/21/18 0543  WBC 7.1  HGB 10.7*  HCT 32.6*  PLT 425*    Chemistries  Recent Labs  Lab 04/18/18 1503  04/21/18 0543  NA 134*   < > 134*  K 3.6   < > 4.6  CL 99   < > 96*  CO2 26   < > 30  GLUCOSE 138*   < > 150*  BUN 11   < > 18  CREATININE 0.82   < > 0.81  CALCIUM 8.3*   < > 8.4*  AST 72*  --   --   ALT 40  --   --   ALKPHOS 175*  --   --   BILITOT 0.3  --   --    < > = values in this interval not displayed.    Cardiac Enzymes Recent Labs  Lab 04/18/18 1503  TROPONINI <0.03    Microbiology Results  Results for orders placed or performed during the hospital encounter of 03/01/18  CSF culture     Status: None   Collection Time: 03/01/18 12:51 PM  Result Value Ref Range Status   Specimen Description   Final    CSF Performed at [facility], [address], [place], [place] [address]    Special Requests   Final    NONE Performed at [facility], [address] [place], [place] [address]    Gram Stain   Final    NO ORGANISMS SEEN WBC SEEN NO RBC SEEN Performed at [facility], [address], [place], [place] [address]    Culture   Final    NO GROWTH 3 DAYS Performed at [facility], [place] [address], [place], [place] [address]    Report  Status 03/05/2018 FINAL  Final    RADIOLOGY:  No results found.  EKG:   Orders placed or performed during the hospital encounter of 04/18/18  . ED EKG  . ED EKG      Management plans discussed with the patient, family and they are in agreement.  CODE STATUS:     Code Status Orders  (From admission, onward)         Start     Ordered   04/21/18 1024  Do not attempt resuscitation (DNR)  Continuous    Question Answer Comment  In the event of cardiac or respiratory ARREST Do not call a "code blue"   In the event of cardiac or respiratory ARREST Do not perform Intubation, CPR, defibrillation or ACLS   In the event of cardiac or respiratory ARREST Use medication by any route, position, wound care, and other measures to relive pain and suffering. May use oxygen, suction and manual treatment of airway obstruction as needed for comfort.   Comments Nurse to pronounce      04/21/18 1028        Code Status History  Date Active Date Inactive Code Status Order ID Comments User Context   04/21/2018 1023 04/21/2018 1028 DNR [id number]  Gorden Harms, MD Inpatient   04/18/2018 2227 04/21/2018 1023 Full Code [id number]  Fritzi Mandes, MD Inpatient   04/06/2018 1803 04/07/2018 1524 DNR [id number]  Demetrios Loll, MD Inpatient   12/16/2017 1247 12/22/2017 2036 Full Code [id number]  Arta Silence, MD Inpatient   05/18/2012 1813 05/20/2012 1306 Full Code [id number]  Adin Hector, MD Inpatient    Advance Directive Documentation     Most Recent Value  Type of Advance Directive  Healthcare Power of [place], Living will  Pre-existing out of facility DNR order (yellow form or pink MOST form)  -  "MOST" Form in Place?  -      TOTAL TIME TAKING CARE OF THIS PATIENT: 40 minutes.    Avel Peace Shawne Bulow M.D on 04/24/2018 at 10:11 AM  Between 7am to 6pm - Pager - [phone]  After 6pm go to [email or web address] - password [email or web address] [facility]  Office  [phone]  CC: Primary care  physician; [name], MD   Note: This dictation was prepared with Dragon dictation along with smaller phrase technology. Any transcriptional errors that result from this process are unintentional.

## 2018-04-24 NOTE — Progress Notes (Signed)
In discussion with the patient's husband-he does not want to take the patient home at this time, will defer discharge to the patient is more comfortable taking his wife home with home hospice

## 2018-04-24 NOTE — Care Management (Signed)
Hospice stated that the hospital bed was supposed to be delivered today at 10 EMS need to be scheduled to transport the patient home, awaiting Josh with Hospice to speak to The Dr. To determine if everything is set for the patient to go home

## 2018-04-24 NOTE — Progress Notes (Signed)
Family Meeting Note  Advance Directive:yes  Today a meeting took place with the patient and spouse.  Patient is able to participate   The following clinical team members were present during this meeting:MD  The following were discussed:Patient's diagnosis: DNR/comfort care, home with hospice, Patient's progosis: Unable to determine and Goals for treatment: DNR  Additional follow-up to be provided: prn  Time spent during discussion:20 minutes  Gorden Harms, MD

## 2018-04-25 MED ORDER — LORAZEPAM 2 MG/ML PO CONC
1.0000 mg | ORAL | Status: DC | PRN
  Administered 2018-04-25 (×2): 1 mg via SUBLINGUAL
  Filled 2018-04-25 (×3): qty 1

## 2018-04-25 MED ORDER — HALOPERIDOL LACTATE 2 MG/ML PO CONC: 0.5000 mg | ORAL | 0 refills | Status: DC | PRN

## 2018-04-25 MED ORDER — LORAZEPAM 2 MG/ML PO CONC: 1.0000 mg | ORAL | 0 refills | Status: DC | PRN

## 2018-04-25 NOTE — Progress Notes (Signed)
No return of call from the patient's husband as yet, did discuss case with palliative care team/hospice, will hold discharge, and plan for possible transfer to hospice house when bed is available if family is in agreement.

## 2018-04-25 NOTE — Progress Notes (Signed)
Per discussion with Palliative NP Josh Borders, following a family meeting this afternoon, family wishes for patient to be moved to the hospice home. Hospital care team updated. Referral updated. Patient placed on hospice home wait list.  Flo Shanks RN, BSN, [place] [facility] [place], hospital Liaison [phone]

## 2018-04-25 NOTE — Discharge Summary (Addendum)
[facility] NAME: Mary [name]    MR#:  [id number]  DATE OF BIRTH:  November 18, 1960  DATE OF ADMISSION:  04/18/2018 ADMITTING PHYSICIAN: Fritzi Mandes, MD  DATE OF DISCHARGE: No discharge date for patient encounter.  PRIMARY CARE PHYSICIAN: [name], MD    ADMISSION DIAGNOSIS:  Nephrostomy status ([place]) [Z93.6] Constipation [K59.00] Syncope, unspecified syncope type [R55]  DISCHARGE DIAGNOSIS:  Active Problems:   Altered mental status   Constipation   Palliative care encounter   Neoplasm related pain   Malignant ascites   Nephrostomy status (HCC)   Abdominal carcinomatosis (HCC)   Syncope   Leptomeningeal disease   SECONDARY DIAGNOSIS:   Past Medical History:  Diagnosis Date  . Anxiety    takes Xanax prn  . Breast cancer [facility]) August 2013   bilateral, er/pr+, bladder (02/2018..mets from breast ca)  . Chronic kidney disease 01/2018   recent shut down of kidneys with labs out of whack  . Complication of anesthesia    pt states she had the shakes extremely bad  . Cough    at night d/t reflux;nothing productive  . Dental crowns present    x 4  . Depression   . GERD (gastroesophageal reflux disease)    takes Nexium daily  . History of bronchitis    08/2011  . History of colon polyps   . Hot flashes, menopausal 08/08/2012  . IBS (irritable bowel syndrome)    immodium prn  . Insomnia    takes Ambien nightly  . Irritable bowel syndrome   . 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 Neutropenia, drug-induced ([place]) 12/15/2011  . Nocturia   . Plantar fasciitis   . S/P radiation therapy 07/03/2012 through 08/18/2038                                                        Right chest wall/regional lymph nodes 5040 cGy 28 sessions, right chest wall/mastectomy scar boost 1000 cGy 5 sessions                          . Status post chemotherapy    FEC 100 starting on 12/08/2011 - 01/18/12/single agent Taxol x12  weeks from 02/02/2012 through January 2014   . Urinary frequency   . Use of tamoxifen (Nolvadex)    now stopped. will be starting ibrance on 03/05/18  . Vertigo     HOSPITAL COURSE:  58 year old female patient with history of metastatic breast cancer with meningeal mets currently had a recent placement of Ommaya reservoir by neurosurgery.  Due for next chemotherapy on Friday through the reservoir  *Intermittent loss of consciousness stable Could be subtle seizures Discussed with neurology-continue Keppra for 1 month, EEG was normal, CT head negative  In discussion with the patient and the patient's husband with caregivers present-patient made herself DNR/comfort care going forward given terminal prognosis -continue expectant management with comfort care measures, home with hospice  *Acute malignant ascites Resolved Related breast cancer Status post paracentesis with 3.2 L of fluid drained off April 20, 2018 Plan of care as stated above  *Chronic anxiety disorder Plan of care as stated above  *History of nephrostomy tube secondary to right-sided hydronephrosis Renal ultrasound shows nephrostomy tube intact Plan of care  as stated above  *Chronic metastatic breast cancer Oncology input appreciated Plan of care as stated above  *Acute on chronic cancer pain syndrome Plan of care as stated above  *DVT prophylaxis Continue oral Eliquis  *Pleural effusions and fluid overload Stable Continued Lasix Plan of care as stated above  DNR/comfort care going forward with expectant management, plans for home with hospice  DISCHARGE CONDITIONS:   terminal  CONSULTS OBTAINED:  Treatment Team:  Lahoma Crocker, MD  DRUG ALLERGIES:   Allergies  Allergen Reactions  . Adhesive [Tape] Other (See Comments)    Redness:Please use paper tape  . Albumin (Human) Swelling and Rash    DISCHARGE MEDICATIONS:   Allergies as of 04/25/2018      Reactions   Adhesive [tape]  Other (See Comments)   Redness:Please use paper tape   Albumin (human) Swelling, Rash      Medication List    STOP taking these medications   ALPRAZolam 0.25 MG tablet Commonly known as:  XANAX   amLODipine 5 MG tablet Commonly known as:  NORVASC   CALTRATE 600+D PO   diazepam 10 MG tablet Commonly known as:  VALIUM   esomeprazole 40 MG capsule Commonly known as:  NEXIUM   FASLODEX 250 MG/5ML injection Generic drug:  fulvestrant   ferrous sulfate 325 (65 FE) MG EC tablet   furosemide 20 MG tablet Commonly known as:  LASIX   goserelin 3.6 MG injection Commonly known as:  ZOLADEX   lidocaine-prilocaine cream Commonly known as:  EMLA   loperamide 2 MG tablet Commonly known as:  IMODIUM A-D   multivitamin tablet   ondansetron 4 MG tablet Commonly known as:  ZOFRAN   vitamin C 100 MG tablet     TAKE these medications   apixaban 2.5 MG Tabs tablet Commonly known as:  ELIQUIS Take 1 tablet (2.5 mg total) by mouth 2 (two) times daily.   dronabinol 5 MG capsule Commonly known as:  MARINOL Take 1 capsule (5 mg total) by mouth 2 (two) times daily before lunch and supper.   haloperidol 2 MG/ML solution Commonly known as:  HALDOL Place 0.3 mLs (0.6 mg total) under the tongue every 4 (four) hours as needed for agitation (or delirium).   levETIRAcetam 100 MG/ML solution Commonly known as:  KEPPRA Take 5 mLs (500 mg total) by mouth 2 (two) times daily.   LORazepam 2 MG/ML concentrated solution Commonly known as:  ATIVAN Place 0.5 mLs (1 mg total) under the tongue every 4 (four) hours as needed for anxiety, seizure, sedation or sleep (Agitation, shortness of breath).   morphine CONCENTRATE 10 MG/0.5ML Soln concentrated solution Take 0.25 mLs (5 mg total) by mouth every 2 (two) hours as needed for moderate pain (or dyspnea).   ondansetron 4 MG disintegrating tablet Commonly known as:  ZOFRAN-ODT Take 1 tablet (4 mg total) by mouth every 6 (six) hours as needed  for nausea.   oxyCODONE 10 mg 12 hr tablet Commonly known as:  OXYCONTIN Take 1 tablet (10 mg total) by mouth every 12 (twelve) hours. What changed:  Another medication with the same name was removed. Continue taking this medication, and follow the directions you see here.   prochlorperazine 10 MG tablet Commonly known as:  COMPAZINE Take 1 tablet (10 mg total) by mouth every 6 (six) hours as needed (Nausea or vomiting).   scopolamine 1 MG/3DAYS Commonly known as:  TRANSDERM-SCOP Place 1 patch (1.5 mg total) onto the skin every 3 (three) days.  senna 8.6 MG Tabs tablet Commonly known as:  SENOKOT Take 2 tablets (17.2 mg total) by mouth daily.            Durable Medical Equipment  (From admission, onward)         Start     Ordered   04/20/18 1338  For home use only DME Hospital bed  Once    Question Answer Comment  The above medical condition requires: Patient requires the ability to reposition frequently   Head must be elevated greater than: 45 degrees   Bed type Semi-electric      04/20/18 1338           DISCHARGE INSTRUCTIONS:      If you experience worsening of your admission symptoms, develop shortness of breath, life threatening emergency, suicidal or homicidal thoughts you must seek medical attention immediately by calling 911 or calling your MD immediately  if symptoms less severe.  You Must read complete instructions/literature along with all the possible adverse reactions/side effects for all the Medicines you take and that have been prescribed to you. Take any new Medicines after you have completely understood and accept all the possible adverse reactions/side effects.   Please note  You were cared for by a hospitalist during your hospital stay. If you have any questions about your discharge medications or the care you received while you were in the hospital after you are discharged, you can call the unit and asked to speak with the hospitalist on call  if the hospitalist that took care of you is not available. Once you are discharged, your primary care physician will handle any further medical issues. Please note that NO REFILLS for any discharge medications will be authorized once you are discharged, as it is imperative that you return to your primary care physician (or establish a relationship with a primary care physician if you do not have one) for your aftercare needs so that they can reassess your need for medications and monitor your lab values.    Today   CHIEF COMPLAINT:   Chief Complaint  Patient presents with  . Loss of Consciousness    HISTORY OF PRESENT ILLNESS:   58 y.o. female with a known history of metastatic breast cancer with little meningeal meds recently had placement of Ommaya reservoir [place] neurosurgery April 13, 2017. Patient will get her next chemo on Friday through that reservoir . She follows with Dr. Tasia Catchings the cancer center. Patient has been having episodes of blanking out spells with intermittent loss of consciousness. She is not had any fall. She does not walk. She has caregivers at home according to the husband in the room. CT head was done which is essentially stable from previous CT scan. ER physician spoke with neurosurgery at [facility] and they recommend patient be admitted for rule out possible seizure if CT head is negative.  No Witnessed seizures. Patient has not had EEG. She is currently awake alert and conversation with me.  Patient is being admitted for further evaluation of management.   VITAL SIGNS:  Blood pressure 122/76, pulse 80, temperature 98.5 F (36.9 C), temperature source Oral, resp. rate 16, height 5\' 5"  (1.651 m), weight 58.2 kg, last menstrual period 11/28/2011, SpO2 94 %.  I/O:    Intake/Output Summary (Last 24 hours) at 04/25/2018 1018 Last data filed at 04/25/2018 0900 Gross per 24 hour  Intake -  Output 575 ml  Net -575 ml    PHYSICAL EXAMINATION:  GENERAL:  57  y.o.-year-old  patient lying in the bed with no acute distress.  EYES: Pupils equal, round, reactive to light and accommodation. No scleral icterus. Extraocular muscles intact.  HEENT: Head atraumatic, normocephalic. Oropharynx and nasopharynx clear.  NECK:  Supple, no jugular venous distention. No thyroid enlargement, no tenderness.  LUNGS: Normal breath sounds bilaterally, no wheezing, rales,rhonchi or crepitation. No use of accessory muscles of respiration.  CARDIOVASCULAR: S1, S2 normal. No murmurs, rubs, or gallops.  ABDOMEN: Soft, non-tender, non-distended. Bowel sounds present. No organomegaly or mass.  EXTREMITIES: No pedal edema, cyanosis, or clubbing.  NEUROLOGIC: Cranial nerves II through XII are intact. Muscle strength 5/5 in all extremities. Sensation intact. Gait not checked.  PSYCHIATRIC: The patient is alert and oriented x 3.  SKIN: No obvious rash, lesion, or ulcer.   DATA REVIEW:   CBC Recent Labs  Lab 04/21/18 0543  WBC 7.1  HGB 10.7*  HCT 32.6*  PLT 425*    Chemistries  Recent Labs  Lab 04/18/18 1503  04/21/18 0543  NA 134*   < > 134*  K 3.6   < > 4.6  CL 99   < > 96*  CO2 26   < > 30  GLUCOSE 138*   < > 150*  BUN 11   < > 18  CREATININE 0.82   < > 0.81  CALCIUM 8.3*   < > 8.4*  AST 72*  --   --   ALT 40  --   --   ALKPHOS 175*  --   --   BILITOT 0.3  --   --    < > = values in this interval not displayed.    Cardiac Enzymes Recent Labs  Lab 04/18/18 1503  TROPONINI <0.03    Microbiology Results  Results for orders placed or performed during the hospital encounter of 03/01/18  CSF culture     Status: None   Collection Time: 03/01/18 12:51 PM  Result Value Ref Range Status   Specimen Description   Final    CSF Performed at [facility], [address], [place], [place] [address]    Special Requests   Final    NONE Performed at [facility] [address] [place], [place] [address]    Gram Stain   Final    NO ORGANISMS  SEEN WBC SEEN NO RBC SEEN Performed at [facility], [address], [place], [place] [address]    Culture   Final    NO GROWTH 3 DAYS Performed at [facility], [place] [address] [place], [place] [address]    Report Status 03/05/2018 FINAL  Final    RADIOLOGY:  No results found.  EKG:   Orders placed or performed during the hospital encounter of 04/18/18  . ED EKG  . ED EKG      Management plans discussed with the patient, family and they are in agreement.  CODE STATUS:     Code Status Orders  (From admission, onward)         Start     Ordered   04/21/18 1024  Do not attempt resuscitation (DNR)  Continuous    Question Answer Comment  In the event of cardiac or respiratory ARREST Do not call a "code blue"   In the event of cardiac or respiratory ARREST Do not perform Intubation, CPR, defibrillation or ACLS   In the event of cardiac or respiratory ARREST Use medication by any route, position, wound care, and other  measures to relive pain and suffering. May use oxygen, suction and manual treatment of airway obstruction as needed for comfort.   Comments Nurse to pronounce      04/21/18 1028        Code Status History    Date Active Date Inactive Code Status Order ID Comments User Context   04/21/2018 1023 04/21/2018 1028 DNR [id number]  Gorden Harms, MD Inpatient   04/18/2018 2227 04/21/2018 1023 Full Code [id number]  Fritzi Mandes, MD Inpatient   04/06/2018 1803 04/07/2018 1524 DNR [id number]  Demetrios Loll, MD Inpatient   12/16/2017 1247 12/22/2017 2036 Full Code [id number]  Arta Silence, MD Inpatient   05/18/2012 1813 05/20/2012 1306 Full Code [id number]  Adin Hector, MD Inpatient    Advance Directive Documentation     Most Recent Value  Type of Advance Directive  Healthcare Power of [place], Living will  Pre-existing out of facility DNR order (yellow form or pink MOST form)  -  "MOST" Form in Place?  -      TOTAL TIME TAKING CARE OF THIS PATIENT:  40 minutes.    Avel Peace Terrall Bley M.D on 04/25/2018 at 10:18 AM  Between 7am to 6pm - Pager - [phone]  After 6pm go to [email or web address] - password [email or web address] [facility]  Office  [phone]  CC: Primary care physician; [name], MD   Note: This dictation was prepared with Dragon dictation along with smaller phrase technology. Any transcriptional errors that result from this process are unintentional.

## 2018-04-25 NOTE — Progress Notes (Signed)
Follow up visit made. Patient seen lying in bed, appeared to be sleeping peacefully. Friends Lelon Frohlich and [place] and caregiver Hope present in the room, all expressed concern over patient returning home and ability top manage symptoms effectively. Patient's husband is not present as he had appointments this morning and is expected later this afternoon. Per discussion with staff RN Melissa patient has had oral medications discontinued as patient has been unable to take oral medications this morning, including liquid Kepra. She has had 2 doses of IV Dilaudid 0.5mg  since 6:45 am. Much emotional support given to family/friends. Discharge plan is unclear at this time. Will continue to follow and provide support. Flo Shanks RN, BSN, [place] [facility] [place], [facility] [phone]

## 2018-04-25 NOTE — Progress Notes (Signed)
Notified Dr Jerelyn Charles that pt was unable to take pills by mouth, so you want to change keppra to IV, New verbal orders to Discontinue schedule medications, pt is comfort care. Informed MD pt had rash on back. No new orders

## 2018-04-25 NOTE — Progress Notes (Signed)
Patient ready for discharge from hospitalist standpoint.  Discharge to home with hospice services when okay with husband.  Did discuss delay of discharge on yesterday with the patient's husband as he wanted 1 more day in the hospital, I tried to call the husband today-left message on his phone.

## 2018-04-25 NOTE — Progress Notes (Signed)
[place]  [phone] [phone]   Name: Mary [name] Date: 04/25/2018 MRN: [id number]  DOB: 1961/02/21  Patient Care Team: [name], MD as PCP - General (Internal Medicine) Barrington Ellison, RN as [facility]    REASON FOR CONSULTATION: Palliative Care consult requested for this 58 y.o. female with multiple medical problems including stage IV breast cancer (initially diagnosed in August 2013) status post neoadjuvant chemotherapy and bilateral mastectomy and adjuvant RT.  Patient was found to have disease with recurrence in September 2019 when she developed obstructive nephropathy requiring nephrostomy tubes.  Patient was found to have metastatic disease to bone, bladder wall, and peritoneum.  She was started on Taxol treatment.  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  She is status post Ommaya reservoir (placed on 04/13/2018) with plan to receive intrathecal methotrexate.  PMH also notable for CKD stage II, history of DVT (11/2017) on Eliquis, ascites requiring paracentesis, IBS, anxiety, and depression.  Patient was recently hospitalized 04/07/19/18/20 with lysed weakness and acute kidney injury secondary to dehydration.  Patient's performance status has rapidly declined and she is now bedbound.  She has had transient periods of unresponsiveness and was admitted to the hospital on 04/18/2018 for evaluation.  Palliative care was consulted to help address goals.  CODE STATUS: Full code  PAST MEDICAL HISTORY: Past Medical History:  Diagnosis Date  . Anxiety    takes Xanax prn  . Breast cancer [facility]) August 2013   bilateral, er/pr+, bladder (02/2018..mets from breast ca)  . Chronic kidney disease 01/2018   recent shut down of kidneys with labs out of whack  . Complication of anesthesia    pt  states she had the shakes extremely bad  . Cough    at night d/t reflux;nothing productive  . Dental crowns present    x 4  . Depression   . GERD (gastroesophageal reflux disease)    takes Nexium daily  . History of bronchitis    08/2011  . History of colon polyps   . Hot flashes, menopausal 08/08/2012  . IBS (irritable bowel syndrome)    immodium prn  . Insomnia    takes Ambien nightly  . Irritable bowel syndrome   . 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 Neutropenia, drug-induced ([place]) 12/15/2011  . Nocturia   . Plantar fasciitis   . S/P radiation therapy 07/03/2012 through 08/18/2038                                                        Right chest wall/regional lymph nodes 5040 cGy 28 sessions, right chest wall/mastectomy scar boost 1000 cGy 5 sessions                          . Status post chemotherapy    FEC 100 starting on 12/08/2011 - 01/18/12/single agent Taxol x12 weeks from 02/02/2012 through January 2014   . Urinary frequency   . Use of tamoxifen (Nolvadex)    now stopped. will be starting ibrance on 03/05/18  . Vertigo     PAST SURGICAL HISTORY:  Past Surgical History:  Procedure Laterality Date  . BREAST LUMPECTOMY  2008  left  . COLONOSCOPY    . COLONOSCOPY WITH PROPOFOL N/A 10/10/2017   Procedure: COLONOSCOPY WITH PROPOFOL;  Surgeon: Lucilla Lame, MD;  Location: [facility] ENDOSCOPY;  Service: Endoscopy;  Laterality: N/A;  . CYSTOSCOPY WITH STENT PLACEMENT Bilateral 12/17/2017   Procedure: CYSTOSCOPY WITH STENT PLACEMENT;  Surgeon: Hollice Espy, MD;  Location: [facility] ORS;  Service: Urology;  Laterality: Bilateral;  . IR NEPHROSTOMY EXCHANGE LEFT  02/13/2018  . IR NEPHROSTOMY EXCHANGE RIGHT  01/02/2018  . IR NEPHROSTOMY EXCHANGE RIGHT  02/13/2018  . IR NEPHROSTOMY PLACEMENT LEFT  12/19/2017  . IR NEPHROSTOMY PLACEMENT RIGHT  12/19/2017  . IR NEPHROSTOMY PLACEMENT RIGHT  04/06/2018  . LUMBAR PUNCTURE  03/01/2018  . MASTECTOMY Bilateral 2013  . MASTECTOMY WITH  AXILLARY LYMPH NODE DISSECTION Right 05/18/2012   Procedure: MASTECTOMY WITH AXILLARY LYMPH NODE DISSECTION;  Surgeon: Adin Hector, MD;  Location: [place];  Service: General;  Laterality: Right;  Bilateral Total Mastectomy , right axillary lymph node dissection left axillary sentinel node biopsy, removal of Porta Cath  . NEPHROSTOMY TUBE REMOVAL Bilateral 03/01/2018   removed tubes in dr. Erlene Quan office  . PORT-A-CATH REMOVAL N/A 05/18/2012   Procedure: REMOVAL PORT-A-CATH;  Surgeon: Adin Hector, MD;  Location: [place];  Service: General;  Laterality: N/A;  . PORTA CATH INSERTION N/A 12/26/2017   Procedure: PORTA CATH INSERTION;  Surgeon: Katha Cabal, MD;  Location: [place] CV LAB;  Service: Cardiovascular;  Laterality: N/A;  . PORTACATH PLACEMENT  12/02/2011   Procedure: INSERTION PORT-A-CATH;  Surgeon: Adin Hector, MD;  Location: [place];  Service: General;  Laterality: N/A;  insertion port a cath with fluoroscopy  . SIMPLE MASTECTOMY WITH AXILLARY SENTINEL NODE BIOPSY Left 05/18/2012   Procedure: SIMPLE MASTECTOMY WITH AXILLARY SENTINEL NODE BIOPSY;  Surgeon: Adin Hector, MD;  Location: [place];  Service: General;  Laterality: Left;    HEMATOLOGY/ONCOLOGY HISTORY:    Primary cancer of upper outer quadrant of left female breast ([place])   11/17/2011 Initial Diagnosis    Cancer of upper-outer quadrant of female breast diagnosed when pt presented with Nipple flattening and palpable lesion.    12/08/2011 - 03/29/2012 Chemotherapy    Neo adjuvant FEC X 4 followed by Weekly Taxol X 12    05/18/2012 Surgery    Bilateral Mastectomies: Right revealed invasive  Lobular carcinoma measuring 2.3 cm 8 of 17 lymph nodes were positive (T2 N2A). 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    07/04/2012 - 08/17/2012 Radiation Therapy    Rt Breast irradiation    08/19/2012 -  Anti-estrogen oral therapy    Initially Tamoxifen + Zolodex  and changed to Aromasin + Zolodex Monthly since 08/2013    12/22/2017 -  Chemotherapy    The patient had PACLitaxel (TAXOL) 144 mg in sodium chloride 0.9 % 250 mL chemo infusion (</= '80mg'$ /m2), 80 mg/m2 = 144 mg, Intravenous,  Once, 3 of 4 cycles Administration: 144 mg (12/22/2017), 144 mg (12/29/2017), 144 mg (01/05/2018), 144 mg (01/19/2018), 144 mg (01/26/2018), 144 mg (02/02/2018), 144 mg (04/05/2018), 144 mg (04/17/2018)  for chemotherapy treatment.     04/20/2018 -  Chemotherapy    The patient had methotrexate (PF) 12 mg in sodium chloride 0.9 % INTRATHECAL chemo injection, , Intrathecal,  Once, 0 of 6 cycles  for chemotherapy treatment.      Metastatic breast cancer ([place])   12/19/2017 Initial Diagnosis    Metastatic breast cancer ([place])  12/22/2017 -  Chemotherapy    The patient had PACLitaxel (TAXOL) 144 mg in sodium chloride 0.9 % 250 mL chemo infusion (</= '80mg'$ /m2), 80 mg/m2 = 144 mg, Intravenous,  Once, 3 of 4 cycles Administration: 144 mg (12/22/2017), 144 mg (12/29/2017), 144 mg (01/05/2018), 144 mg (01/19/2018), 144 mg (01/26/2018), 144 mg (02/02/2018), 144 mg (04/05/2018), 144 mg (04/17/2018)  for chemotherapy treatment.     04/20/2018 -  Chemotherapy    The patient had methotrexate (PF) 12 mg in sodium chloride 0.9 % INTRATHECAL chemo injection, , Intrathecal,  Once, 0 of 6 cycles  for chemotherapy treatment.      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      ALLERGIES:  is allergic to adhesive [tape] and albumin (human).  MEDICATIONS:  Current Facility-Administered Medications  Medication Dose Route Frequency Provider Last Rate Last Dose  . 0.9 %  sodium chloride infusion  250 mL Intravenous PRN Salary, Montell D, MD      . acetaminophen (TYLENOL) tablet 650 mg  650 mg Oral Q6H PRN Fritzi Mandes, MD       Or  . acetaminophen (TYLENOL) suppository 650 mg  650 mg Rectal Q6H PRN Fritzi Mandes, MD      . antiseptic oral rinse (BIOTENE) solution 15 mL  15 mL Topical PRN Salary, Montell D, MD      . chlorproMAZINE (THORAZINE) 12.5 mg in sodium chloride 0.9 % 25 mL IVPB  12.5 mg Intravenous Q6H PRN Salary, Montell D, MD      . diphenhydrAMINE (BENADRYL) injection 25 mg  25 mg Intravenous Q6H PRN Salary, Montell D, MD   25 mg at 04/22/18 2209  . haloperidol (HALDOL) 2 MG/ML solution 0.5 mg  0.5 mg Sublingual Q4H PRN Salary, Montell D, MD      . HYDROmorphone (DILAUDID) injection 0.5 mg  0.5 mg Intravenous Q2H PRN Salary, Montell D, MD   0.5 mg at 04/25/18 1606  . loperamide (IMODIUM) capsule 2 mg  2 mg Oral QID PRN Fritzi Mandes, MD      . LORazepam (ATIVAN) 2 MG/ML concentrated solution 1 mg  1 mg Sublingual Q4H PRN Salary, Montell D, MD   1 mg at 04/25/18 1322  . magic mouthwash  15 mL Oral Q6H PRN Salary, Montell D, MD      . morphine CONCENTRATE 10 MG/0.5ML oral solution 10-20 mg  10-20 mg Oral Q1H PRN Suleiman Finigan, Kirt Boys, NP   20 mg at 04/24/18 1605  . naloxegol oxalate (MOVANTIK) tablet 25 mg  25 mg Oral Daily Diar Berkel, Kirt Boys, NP   25 mg at 04/24/18 0913  . nystatin (MYCOSTATIN/NYSTOP) topical powder   Topical TID PRN Salary, Montell D, MD      . ondansetron (ZOFRAN-ODT) disintegrating tablet 4 mg  4 mg Oral Q6H PRN Salary, Avel Peace, MD  Or  . ondansetron (ZOFRAN) injection 4 mg  4 mg Intravenous Q6H PRN Salary, Montell D, MD      .  oxybutynin (DITROPAN) tablet 2.5 mg  2.5 mg Oral QID PRN Salary, Montell D, MD      . polyvinyl alcohol (LIQUIFILM TEARS) 1.4 % ophthalmic solution 1 drop  1 drop Both Eyes QID PRN Salary, Montell D, MD      . prochlorperazine (COMPAZINE) tablet 10 mg  10 mg Oral Q6H PRN Fritzi Mandes, MD      . promethazine (PHENERGAN) injection 12.5 mg  12.5 mg Intravenous Q6H PRN Saundra Shelling, MD   12.5 mg at 04/23/18 2003  . scopolamine (TRANSDERM-SCOP) 1 MG/3DAYS 1.5 mg  1 patch Transdermal Q72H Fritzi Mandes, MD   1.5 mg at 04/25/18 0026  . sodium chloride flush (NS) 0.9 % injection 3 mL  3 mL Intravenous Q12H Salary, Montell D, MD   3 mL at 04/25/18 0928  . sodium chloride flush (NS) 0.9 % injection 3 mL  3 mL Intravenous PRN Salary, Holly Bodily D, MD   3 mL at 04/23/18 0910  . sodium phosphate (FLEET) 7-19 GM/118ML enema 1 enema  1 enema Rectal Daily PRN Saundra Shelling, MD   1 enema at 04/20/18 1130  . triamcinolone cream (KENALOG) 0.1 %   Topical BID PRN Salary, Montell D, MD        VITAL SIGNS: BP (!) 146/78 (BP Location: Right Arm)   Pulse 65   Temp (!) 97.5 F (36.4 C) (Oral)   Resp 15   Ht '5\' 5"'$  (1.651 m)   Wt 128 lb 6.4 oz (58.2 kg)   LMP 11/28/2011   SpO2 92%   BMI 21.37 kg/m  Filed Weights   04/18/18 1500 04/22/18 0633  Weight: 146 lb (66.2 kg) 128 lb 6.4 oz (58.2 kg)    Estimated body mass index is 21.37 kg/m as calculated from the following:   Height as of this encounter: '5\' 5"'$  (1.651 m).   Weight as of this encounter: 128 lb 6.4 oz (58.2 kg).  LABS: CBC:    Component Value Date/Time   WBC 7.1 04/21/2018 0543   HGB 10.7 (L) 04/21/2018 0543   HGB 14.8 04/29/2014 1327   HCT 32.6 (L) 04/21/2018 0543   HCT 43.8 04/29/2014 1327   PLT 425 (H) 04/21/2018 0543   PLT 206 04/29/2014 1327   MCV 91.1 04/21/2018 0543   MCV 95.0 04/29/2014 1327   NEUTROABS 3.0 04/17/2018 0815   NEUTROABS 3.9 04/29/2014 1327   LYMPHSABS 0.6 (L) 04/17/2018 0815   LYMPHSABS 1.7 04/29/2014 1327   MONOABS  0.4 04/17/2018 0815   MONOABS 0.6 04/29/2014 1327   EOSABS 0.0 04/17/2018 0815   EOSABS 0.1 04/29/2014 1327   BASOSABS 0.1 04/17/2018 0815   BASOSABS 0.0 04/29/2014 1327   Comprehensive Metabolic Panel:    Component Value Date/Time   NA 134 (L) 04/21/2018 0543   NA 138 12/16/2017   NA 141 04/29/2014 1327   K 4.6 04/21/2018 0543   K 4.0 04/29/2014 1327   CL 96 (L) 04/21/2018 0543   CL 106 08/20/2012 0802   CO2 30 04/21/2018 0543   CO2 24 04/29/2014 1327   BUN 18 04/21/2018 0543   BUN 30 (A) 12/16/2017   BUN 15.7 04/29/2014 1327   CREATININE 0.81 04/21/2018 0543   CREATININE 1.58 (H) 12/15/2017 1525   CREATININE 0.9 04/29/2014 1327   GLUCOSE 150 (H) 04/21/2018 0543   GLUCOSE 97 04/29/2014 1327  GLUCOSE 100 (H) 08/20/2012 0802   CALCIUM 8.4 (L) 04/21/2018 0543   CALCIUM 9.7 04/29/2014 1327   AST 72 (H) 04/18/2018 1503   AST 21 04/29/2014 1327   ALT 40 04/18/2018 1503   ALT 30 04/29/2014 1327   ALKPHOS 175 (H) 04/18/2018 1503   ALKPHOS 124 04/29/2014 1327   BILITOT 0.3 04/18/2018 1503   BILITOT 0.34 04/29/2014 1327   PROT 6.4 (L) 04/18/2018 1503   PROT 7.6 04/29/2014 1327   ALBUMIN 2.9 (L) 04/18/2018 1503   ALBUMIN 4.1 04/29/2014 1327    RADIOGRAPHIC STUDIES: Ct Head Wo Contrast  Result Date: 04/18/2018 CLINICAL DATA:  Syncopal episode, seizures, VP shunt EXAM: CT HEAD WITHOUT CONTRAST TECHNIQUE: Contiguous axial images were obtained from the base of the skull through the vertex without intravenous contrast. COMPARISON:  03/05/2018 MRI FINDINGS: Brain: Right frontal shunt tubing terminates in right lateral ventricle close to midline. There is diffuse enlargement of the ventricular system, difficult to exclude mild hydrocephalus or shunt malfunction. Ventricles remain enlarged and similar to the 03/05/2018 MR. Mild periventricular white matter microvascular ischemic changes. No acute intracranial hemorrhage, mass lesion, definite infarction, midline shift, herniation,  extra-axial fluid collection. No focal mass effect or edema. Cisterns are patent. Cerebellar atrophy as well. Vascular: No hyperdense vessel or unexpected calcification. Skull: Normal. Negative for fracture or focal lesion. Sinuses/Orbits: No acute finding. Other: None. IMPRESSION: Interval right frontal shunt insertion. Similar diffuse ventricular enlargement of the entire ventricular system. Difficult to exclude hydrocephalus or shunt malfunction. Minor white matter microvascular changes Stable brain atrophy pattern. No other acute intracranial abnormality or hemorrhage. Electronically Signed   By: Jerilynn Mages.  Shick M.D.   On: 04/18/2018 20:20   Ct Chest Wo Contrast  Result Date: 04/04/2018 CLINICAL DATA:  Metastatic breast cancer. Status post bilateral mastectomy. EXAM: CT CHEST, ABDOMEN AND PELVIS WITHOUT CONTRAST TECHNIQUE: Multidetector CT imaging of the chest, abdomen and pelvis was performed following the standard protocol without IV contrast. COMPARISON:  02/06/2018 FINDINGS: CT CHEST FINDINGS Cardiovascular: The heart size is normal. No substantial pericardial effusion. Right Port-A-Cath tip is at the SVC/RA junction. Mediastinum/Nodes: No mediastinal lymphadenopathy. There is no hilar lymphadenopathy. Esophagus appears diffusely fluid-filled. There is no axillary lymphadenopathy. Lungs/Pleura: The central tracheobronchial airways are patent. Stable appearance of apparent scarring in the right apex. Mild atelectasis in the right middle and lower lobes is new in the interval. There is a new small right pleural effusion. No suspicious nodule or mass in the left lung. Minimal atelectasis noted left lower lobe. Tiny left pleural effusion evident. Musculoskeletal: Stable similar diffuse heterogeneous bone mineralization CT ABDOMEN PELVIS FINDINGS Hepatobiliary: No focal abnormality in the liver parenchyma. High attenuation material in the gallbladder lumen may be related to stones or sludge. No intrahepatic or  extrahepatic biliary dilation. Pancreas: No focal mass lesion. No dilatation of the main duct. No intraparenchymal cyst. No peripancreatic edema. Spleen: No splenomegaly. No focal mass lesion. Adrenals/Urinary Tract: Similar to the thickening of the left adrenal gland. Thickening of the right adrenal gland is also similar to prior. Bilateral percutaneous nephrostomy tubes been removed in the interval. There is mild to moderate right hydronephrosis with mild fullness of the left intrarenal collecting system. Neither ureter is distended beyond its midpoint. Urinary bladder is decompressed. Stomach/Bowel: Stomach is nondistended. No gastric wall thickening. No evidence of outlet obstruction. Duodenum is normally positioned as is the ligament of Treitz. No small bowel wall thickening. No small bowel dilatation. The terminal ileum is normal. The appendix is not  visualized, but there is no edema or inflammation in the region of the cecum. Colon is diffusely decompressed. Vascular/Lymphatic: No abdominal aortic aneurysm. No retroperitoneal lymphadenopathy. No pelvic sidewall lymphadenopathy. Reproductive: But The uterus is unremarkable. There is no adnexal mass. Other: Moderate to large volume ascites is new in the interval. There is vascular congestion/nodularity in the gastrocolic ligament and omentum suggestion of peritoneal thickening seen in the left pelvis (96/2) and posterior pelvis bilaterally (102/2). Musculoskeletal: Similar appearance of heterogeneous bone mineralization with some more densely sclerotic lesions identified throughout as examples, see L5 vertebral body (84/2) left pubic bone (112/2) and posterior left inferior pubic ramus (118/2). IMPRESSION: 1. Interval development of moderate to large volume ascites with congestion and nodularity in the gastrocolic ligament and omentum. Imaging features highly concerning for metastatic involvement. 2. Interval removal of percutaneous nephrostomy tubes with mild  to moderate right hydronephrosis and mild fullness of the left intrarenal collecting system. 3. Diffuse heterogeneous bone mineralization with scattered sclerotic lesion similar to prior and compatible with metastatic disease. 4. Small right and tiny left pleural effusions. 5. Urinary bladder decompressed on today's study and not well evaluated. Electronically Signed   By: Misty Stanley M.D.   On: 04/04/2018 11:55   US Renal  Result Date: 04/19/2018 CLINICAL DATA:  Nephrostomy status. EXAM: RENAL / URINARY TRACT ULTRASOUND COMPLETE COMPARISON:  None. FINDINGS: Right Kidney: Renal measurements: 11.6 x 5 x 4.3 cm = volume: 129 mL . Echogenicity within normal limits. Mild hydronephrosis is noted. Nephrostomy tube is present. Left Kidney: Renal measurements: 11.3 x 5.3 x 3.9 cm = volume: 121 mL. Echogenicity within normal limits. Moderate hydronephrosis is noted Bladder: Appears normal for degree of bladder distention. No ureteral jets are noted. Ascites are noted in the abdomen pelvis. Bilateral pleural effusions are noted. IMPRESSION: Right nephrostomy tube is identified. There is mild right hydronephrosis. Moderate left hydronephrosis is noted. Ascites identified in the abdomen and pelvis. Bilateral pleural effusions. Electronically Signed   By: Abelardo Diesel M.D.   On: 04/19/2018 11:35   US Renal  Result Date: 04/03/2018 CLINICAL DATA:  Acute kidney injury. EXAM: RENAL / URINARY TRACT ULTRASOUND COMPLETE COMPARISON:  CT of the abdomen and pelvis 02/06/2018 FINDINGS: Right Kidney: Renal measurements: 12.7 x 4.7 x 5.7 cm = volume: 171 mL. Moderate to severe recurrent right-sided hydronephrosis is present. No obstructing lesion or mass is present. Moderate renal thinning is present. Left Kidney: Renal measurements: 11.5 x 4.3 x 5.0 cm = volume: 129 mL. Moderate left-sided hydronephrosis is present. Parenchymal thickness is normal. Bladder: Under distended. Moderate diffuse abdominal ascites is present.  Bilateral pleural effusions are noted. IMPRESSION: 1. Recurrent bilateral hydronephrosis, right greater than left. 2. Moderate diffuse abdominal ascites. 3. Bilateral pleural effusions. Electronically Signed   By: San Morelle M.D.   On: 04/03/2018 16:48   US Paracentesis  Result Date: 04/20/2018 INDICATION: Patient with history of metastatic breast carcinoma with recurrent malignant ascites. Request made for therapeutic paracentesis. EXAM: ULTRASOUND GUIDED THERAPEUTIC PARACENTESIS MEDICATIONS: None COMPLICATIONS: None immediate. PROCEDURE: Informed written consent was obtained from the patient after a discussion of the risks, benefits and alternatives to treatment. A timeout was performed prior to the initiation of the procedure. Initial ultrasound scanning demonstrates a moderate to large amount of ascites within the left lower abdominal quadrant. The left lower abdomen was prepped and draped in the usual sterile fashion. 1% lidocaine was used for local anesthesia. Following this, a 6 Fr Safe-T-Centesis catheter was introduced. An ultrasound image was saved  for documentation purposes. The paracentesis was performed. The catheter was removed and a dressing was applied. The patient tolerated the procedure well without immediate post procedural complication. FINDINGS: A total of approximately 3.2 liters of slightly hazy, yellow fluid was removed. IMPRESSION: Successful ultrasound-guided therapeutic paracentesis yielding 3.2 liters of peritoneal fluid. Read by: Rowe Robert, PA-C Electronically Signed   By: Jerilynn Mages.  Shick M.D.   On: 04/20/2018 10:58   US Paracentesis  Result Date: 04/06/2018 INDICATION: Metastatic breast cancer, ascites, abdominal distension EXAM: ULTRASOUND GUIDED RIGHT PARACENTESIS MEDICATIONS: 1% LIDOCAINE LOCAL COMPLICATIONS: None immediate. PROCEDURE: Informed written consent was obtained from the patient after a discussion of the risks, benefits and alternatives to treatment. A  timeout was performed prior to the initiation of the procedure. Initial ultrasound scanning demonstrates a large amount of ascites within the right lower abdominal quadrant. The right lower abdomen was prepped and draped in the usual sterile fashion. 1% lidocaine with epinephrine was used for local anesthesia. Following this, a 6 Fr Safe-T-Centesis catheter was introduced. An ultrasound image was saved for documentation purposes. The paracentesis was performed. The catheter was removed and a dressing was applied. The patient tolerated the procedure well without immediate post procedural complication. FINDINGS: A total of approximately 4.2 L of PERITONEAL fluid was removed. Samples were sent to the laboratory as requested by the clinical team. IMPRESSION: Successful ultrasound-guided paracentesis yielding 4.2 liters of peritoneal fluid. Electronically Signed   By: Jerilynn Mages.  Shick M.D.   On: 04/06/2018 11:19   Dg Abd 2 Views  Result Date: 04/18/2018 CLINICAL DATA:  Initial evaluation for constipation. EXAM: ABDOMEN - 2 VIEW COMPARISON:  None. FINDINGS: Paucity of gas limits evaluation of the bowels. No evidence for obstruction. No free air on upright view. No abnormal bowel wall thickening. Moderate retained stool within the colon, most notable at the splenic flexure. No soft tissue mass or abnormal calcification. Right-sided percutaneous nephrostomy tube in place. No acute osseous abnormality. Right basilar atelectasis noted. Right-sided Port-A-Cath noted as well. IMPRESSION: Nonobstructive bowel gas pattern with moderate retained stool within the colon. Electronically Signed   By: Jeannine Boga M.D.   On: 04/18/2018 17:44   Ct Renal Stone Study  Result Date: 04/04/2018 CLINICAL DATA:  Metastatic breast cancer. Status post bilateral mastectomy. EXAM: CT CHEST, ABDOMEN AND PELVIS WITHOUT CONTRAST TECHNIQUE: Multidetector CT imaging of the chest, abdomen and pelvis was performed following the standard protocol  without IV contrast. COMPARISON:  02/06/2018 FINDINGS: CT CHEST FINDINGS Cardiovascular: The heart size is normal. No substantial pericardial effusion. Right Port-A-Cath tip is at the SVC/RA junction. Mediastinum/Nodes: No mediastinal lymphadenopathy. There is no hilar lymphadenopathy. Esophagus appears diffusely fluid-filled. There is no axillary lymphadenopathy. Lungs/Pleura: The central tracheobronchial airways are patent. Stable appearance of apparent scarring in the right apex. Mild atelectasis in the right middle and lower lobes is new in the interval. There is a new small right pleural effusion. No suspicious nodule or mass in the left lung. Minimal atelectasis noted left lower lobe. Tiny left pleural effusion evident. Musculoskeletal: Stable similar diffuse heterogeneous bone mineralization CT ABDOMEN PELVIS FINDINGS Hepatobiliary: No focal abnormality in the liver parenchyma. High attenuation material in the gallbladder lumen may be related to stones or sludge. No intrahepatic or extrahepatic biliary dilation. Pancreas: No focal mass lesion. No dilatation of the main duct. No intraparenchymal cyst. No peripancreatic edema. Spleen: No splenomegaly. No focal mass lesion. Adrenals/Urinary Tract: Similar to the thickening of the left adrenal gland. Thickening of the right adrenal gland is also  similar to prior. Bilateral percutaneous nephrostomy tubes been removed in the interval. There is mild to moderate right hydronephrosis with mild fullness of the left intrarenal collecting system. Neither ureter is distended beyond its midpoint. Urinary bladder is decompressed. Stomach/Bowel: Stomach is nondistended. No gastric wall thickening. No evidence of outlet obstruction. Duodenum is normally positioned as is the ligament of Treitz. No small bowel wall thickening. No small bowel dilatation. The terminal ileum is normal. The appendix is not visualized, but there is no edema or inflammation in the region of the  cecum. Colon is diffusely decompressed. Vascular/Lymphatic: No abdominal aortic aneurysm. No retroperitoneal lymphadenopathy. No pelvic sidewall lymphadenopathy. Reproductive: But The uterus is unremarkable. There is no adnexal mass. Other: Moderate to large volume ascites is new in the interval. There is vascular congestion/nodularity in the gastrocolic ligament and omentum suggestion of peritoneal thickening seen in the left pelvis (96/2) and posterior pelvis bilaterally (102/2). Musculoskeletal: Similar appearance of heterogeneous bone mineralization with some more densely sclerotic lesions identified throughout as examples, see L5 vertebral body (84/2) left pubic bone (112/2) and posterior left inferior pubic ramus (118/2). IMPRESSION: 1. Interval development of moderate to large volume ascites with congestion and nodularity in the gastrocolic ligament and omentum. Imaging features highly concerning for metastatic involvement. 2. Interval removal of percutaneous nephrostomy tubes with mild to moderate right hydronephrosis and mild fullness of the left intrarenal collecting system. 3. Diffuse heterogeneous bone mineralization with scattered sclerotic lesion similar to prior and compatible with metastatic disease. 4. Small right and tiny left pleural effusions. 5. Urinary bladder decompressed on today's study and not well evaluated. Electronically Signed   By: Misty Stanley M.D.   On: 04/04/2018 11:55   Ir Nephrostomy Placement Right  Result Date: 04/06/2018 INDICATION: Metastatic breast cancer, recurrent right hydronephrosis EXAM: ULTRASOUND AND FLUOROSCOPIC RIGHT NEPHROSTOMY INSERTION COMPARISON:  04/04/2018 MEDICATIONS: CIPRO 400 MG; The antibiotic was administered in an appropriate time frame prior to skin puncture. ANESTHESIA/SEDATION: Fentanyl 50 mcg IV; Versed 2 mg IV Moderate Sedation Time:  11 minutes The patient was continuously monitored during the procedure by the interventional radiology nurse  under my direct supervision. CONTRAST:  10 cc-administered into the collecting system(s) FLUOROSCOPY TIME:  Fluoroscopy Time: 0 minutes 30 seconds ( mGy). COMPLICATIONS: None immediate. PROCEDURE: Informed written consent was obtained from the patient after a thorough discussion of the procedural risks, benefits and alternatives. All questions were addressed. Maximal Sterile Barrier Technique was utilized including caps, mask, sterile gowns, sterile gloves, sterile drape, hand hygiene and skin antiseptic. A timeout was performed prior to the initiation of the procedure. Previous imaging reviewed. Patient position prone. Right hydronephrotic kidney was localized and marked. Under sterile conditions anesthesia, an 18 gauge introducer needle was advanced into a dilated mid to lower pole calyx. Needle position confirmed with ultrasound. Images obtained for documentation. There was return of urine. Guidewire inserted followed by tract dilatation to insert a 10 French nephrostomy. Retention loop formed the renal pelvis. Contrast injection confirms position. Images obtained for documentation. Catheter secured with a prolene suture and a sterile dressing. Gravity drainage bag connected. No immediate complication. Patient tolerated the procedure well. IMPRESSION: Successful ultrasound and fluoroscopic 10 French right nephrostomy insertion Electronically Signed   By: Jerilynn Mages.  Shick M.D.   On: 04/06/2018 12:05    PERFORMANCE STATUS (ECOG) : 4 - Bedbound  Review of Systems As noted above. Otherwise, a complete review of systems is negative.  Physical Exam General: ill appearing Pulmonary: unlabored GU: no suprapubic tenderness Extremities:  no edema, no joint deformities Skin: no rashes Neurological: lethargic  IMPRESSION: Patient currently sleeping. Comfortable appearing at present but has required frequent dosing of IV hydromorphone overnight and today. Family say that the morphine elixir was not effective at  managing her pain.   I met with patient's husband, sister, mother, father, friend, and caregiver. We discussed her declining status. Husband states continued desire for comfort measures. Plan was to take her home with hospice but family verbalize some reservations. We discussed the alternative option of the Hospice Home and family say they would prefer that to taking her home. Discussed with hospice liaison. Patient placed on waiting list.   Anticipate that she will need hydromorphone infusion at [facility]    PLAN: 1.  Hospice Home when bed is available.  2. Comfort measures 3.  DNR  Time Total: 60 minutes  Visit consisted of counseling and education dealing with the complex and emotionally intense issues of symptom management and palliative care in the setting of serious and potentially life-threatening illness.Greater than 50%  of this time was spent counseling and coordinating care related to the above assessment and plan.  Signed by: Altha Harm, PhD, NP-C [phone] (Work Cell)

## 2018-04-26 MED ORDER — LORAZEPAM 2 MG/ML IJ SOLN
1.0000 mg | INTRAMUSCULAR | Status: DC | PRN
  Administered 2018-04-26 – 2018-04-27 (×3): 1 mg via INTRAVENOUS
  Filled 2018-04-26 (×3): qty 1

## 2018-04-26 MED ORDER — LEVETIRACETAM 100 MG/ML PO SOLN
500.0000 mg | Freq: Two times a day (BID) | ORAL | Status: DC
  Administered 2018-04-26 – 2018-04-27 (×2): 500 mg via ORAL
  Filled 2018-04-26 (×3): qty 5

## 2018-04-26 MED ORDER — LORAZEPAM 2 MG/ML PO CONC
2.0000 mg | Freq: Once | ORAL | Status: AC
  Administered 2018-04-26: 2 mg via SUBLINGUAL

## 2018-04-26 NOTE — Progress Notes (Signed)
Follow up visit made to new hospice home referral. Patient seen lying in bed, appeared to be sleeping comfortable. Caregiver Hope and friend Lelon Frohlich present at bedside. Patient continues to require IV Dilaudid for pain management and she was able to swallow her Movantik.this morning. Family and hospital care team aware there is no bed available for transfer today. Emotional support given. Will continue to follow and update hospital team. Flo Shanks RN, BSN, [facility] [place], hospital Liaison [phone]

## 2018-04-26 NOTE — Progress Notes (Signed)
[facility] [place] NAME: Mary [name]    MR#:  [id number]  DATE OF BIRTH:  1960/10/22  SUBJECTIVE:  No events overnight per nursing staff, family now requesting hospice house placement-disposition pending  REVIEW OF SYSTEMS:  CONSTITUTIONAL: No fever, fatigue or weakness.  EYES: No blurred or double vision.  EARS, NOSE, AND THROAT: No tinnitus or ear pain.  RESPIRATORY: No cough, shortness of breath, wheezing or hemoptysis.  CARDIOVASCULAR: No chest pain, orthopnea, edema.  GASTROINTESTINAL: No nausea, vomiting, diarrhea or abdominal pain.  GENITOURINARY: No dysuria, hematuria.  ENDOCRINE: No polyuria, nocturia,  HEMATOLOGY: No anemia, easy bruising or bleeding SKIN: No rash or lesion. MUSCULOSKELETAL: No joint pain or arthritis.   NEUROLOGIC: No tingling, numbness, weakness.  PSYCHIATRY: No anxiety or depression.   ROS  DRUG ALLERGIES:   Allergies  Allergen Reactions  . Adhesive [Tape] Other (See Comments)    Redness:Please use paper tape  . Albumin (Human) Swelling and Rash    VITALS:  Blood pressure 126/76, pulse 92, temperature 98.3 F (36.8 C), temperature source Oral, resp. rate 14, height 5\' 5"  (1.651 m), weight 58.2 kg, last menstrual period 11/28/2011, SpO2 93 %.  PHYSICAL EXAMINATION:  GENERAL:  57 y.o.-year-old patient lying in the bed with no acute distress.  EYES: Pupils equal, round, reactive to light and accommodation. No scleral icterus. Extraocular muscles intact.  HEENT: Head atraumatic, normocephalic. Oropharynx and nasopharynx clear.  NECK:  Supple, no jugular venous distention. No thyroid enlargement, no tenderness.  LUNGS: Normal breath sounds bilaterally, no wheezing, rales,rhonchi or crepitation. No use of accessory muscles of respiration.  CARDIOVASCULAR: S1, S2 normal. No murmurs, rubs, or gallops.  ABDOMEN: Soft, nontender, nondistended. Bowel sounds present. No organomegaly or mass.  EXTREMITIES:  No pedal edema, cyanosis, or clubbing.  NEUROLOGIC: Cranial nerves II through XII are intact. Muscle strength 5/5 in all extremities. Sensation intact. Gait not checked.  PSYCHIATRIC: The patient is alert and oriented x 3.  SKIN: No obvious rash, lesion, or ulcer.   Physical Exam LABORATORY PANEL:   CBC Recent Labs  Lab 04/21/18 0543  WBC 7.1  HGB 10.7*  HCT 32.6*  PLT 425*   ------------------------------------------------------------------------------------------------------------------  Chemistries  Recent Labs  Lab 04/21/18 0543  NA 134*  K 4.6  CL 96*  CO2 30  GLUCOSE 150*  BUN 18  CREATININE 0.81  CALCIUM 8.4*   ------------------------------------------------------------------------------------------------------------------  Cardiac Enzymes No results for input(s): TROPONINI in the last 168 hours. ------------------------------------------------------------------------------------------------------------------  RADIOLOGY:  No results found.  ASSESSMENT AND PLAN:  58 year old female patient with history of metastatic breast cancer with meningeal mets currently had a recent placement of [place] reservoir by neurosurgery. Due for next chemotherapy on Friday through the reservoir  *Intermittent loss of consciousness stable Could be subtle seizures Neurology did see patient while in house-was treated with Keppra for short course, EEG was normal, CT head negative  In discussion with the patient and the patient's husband with caregivers present-patient made herself DNR/comfort care going forward given terminal prognosis -continue expectant management with comfort care measures, awaiting hospice haven/house placement  *Acute malignant ascites Resolved Related breast cancer Status post paracentesis with 3.2 L of fluid drained off April 20, 2018 Plan of care as stated above  *Chronic anxiety disorder Plan of care as stated above  *History of nephrostomy  tube secondary to right-sided hydronephrosis Renal ultrasound shows nephrostomy tube intact Plan of care as stated above  *Chronic metastatic breast cancer Oncology input appreciated  Plan of care as stated above  *Acute on chronic cancer pain syndrome Plan of care as stated above  *Pleural effusions and fluid overload Plan of care as stated above  DNR/comfort care going forward with expectant management, plans for hospice haven/house when bed is available, patient is ready for discharge    All the records are reviewed and case discussed with Care Management/Social Workerr. Management plans discussed with the patient, family and they are in agreement.  CODE STATUS: dnr  TOTAL TIME TAKING CARE OF THIS PATIENT: 30 minutes.     POSSIBLE D/C IN 03 DAYS, DEPENDING ON CLINICAL CONDITION.   Mary [name]  M.D on 04/26/2018   Between 7am to 6pm - Pager - [phone]  After 6pm go to [email or web address] - password [email or web address] [facility]  Office  [phone]  CC: Primary care physician; [name], MD  Note: This dictation was prepared with Dragon dictation along with smaller phrase technology. Any transcriptional errors that result from this process are unintentional.

## 2018-04-26 NOTE — Progress Notes (Signed)
[place]  [phone] [phone]   Name: Mary [name] Date: 04/26/2018 MRN: [id number]  DOB: [birth year]  Patient Care Team: [name], MD as PCP - General (Internal Medicine) Barrington Ellison, RN as [facility]    REASON FOR CONSULTATION: Palliative Care consult requested for this 58 y.o. female with multiple medical problems including stage IV breast cancer (initially diagnosed in August 2013) status post neoadjuvant chemotherapy and bilateral mastectomy and adjuvant RT.  Patient was found to have disease with recurrence in September 2019 when she developed obstructive nephropathy requiring nephrostomy tubes.  Patient was found to have metastatic disease to bone, bladder wall, and peritoneum.  She was started on Taxol treatment.  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  She is status post Ommaya reservoir (placed on 04/13/2018) with plan to receive intrathecal methotrexate.  PMH also notable for CKD stage II, history of DVT (11/2017) on Eliquis, ascites requiring paracentesis, IBS, anxiety, and depression.  Patient was recently hospitalized 04/07/19/18/20 with lysed weakness and acute kidney injury secondary to dehydration.  Patient's performance status has rapidly declined and she is now bedbound.  She has had transient periods of unresponsiveness and was admitted to the hospital on 04/18/2018 for evaluation.  Palliative care was consulted to help address goals.  CODE STATUS: Full code  PAST MEDICAL HISTORY: Past Medical History:  Diagnosis Date  . Anxiety    takes Xanax prn  . Breast cancer [facility]) August 2013   bilateral, er/pr+, bladder (02/2018..mets from breast ca)  . Chronic kidney disease 01/2018   recent shut down of kidneys with labs out of whack  . Complication of anesthesia    pt  states she had the shakes extremely bad  . Cough    at night d/t reflux;nothing productive  . Dental crowns present    x 4  . Depression   . GERD (gastroesophageal reflux disease)    takes Nexium daily  . History of bronchitis    08/2011  . History of colon polyps   . Hot flashes, menopausal 08/08/2012  . IBS (irritable bowel syndrome)    immodium prn  . Insomnia    takes Ambien nightly  . Irritable bowel syndrome   . 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 Neutropenia, drug-induced ([place]) 12/15/2011  . Nocturia   . Plantar fasciitis   . S/P radiation therapy 07/03/2012 through 08/18/2038                                                        Right chest wall/regional lymph nodes 5040 cGy 28 sessions, right chest wall/mastectomy scar boost 1000 cGy 5 sessions                          . Status post chemotherapy    FEC 100 starting on 12/08/2011 - 01/18/12/single agent Taxol x12 weeks from 02/02/2012 through January 2014   . Urinary frequency   . Use of tamoxifen (Nolvadex)    now stopped. will be starting ibrance on 03/05/18  . Vertigo     PAST SURGICAL HISTORY:  Past Surgical History:  Procedure Laterality Date  . BREAST LUMPECTOMY  2008  left  . COLONOSCOPY    . COLONOSCOPY WITH PROPOFOL N/A 10/10/2017   Procedure: COLONOSCOPY WITH PROPOFOL;  Surgeon: Lucilla Lame, MD;  Location: [facility] ENDOSCOPY;  Service: Endoscopy;  Laterality: N/A;  . CYSTOSCOPY WITH STENT PLACEMENT Bilateral 12/17/2017   Procedure: CYSTOSCOPY WITH STENT PLACEMENT;  Surgeon: Hollice Espy, MD;  Location: [facility] ORS;  Service: Urology;  Laterality: Bilateral;  . IR NEPHROSTOMY EXCHANGE LEFT  02/13/2018  . IR NEPHROSTOMY EXCHANGE RIGHT  01/02/2018  . IR NEPHROSTOMY EXCHANGE RIGHT  02/13/2018  . IR NEPHROSTOMY PLACEMENT LEFT  12/19/2017  . IR NEPHROSTOMY PLACEMENT RIGHT  12/19/2017  . IR NEPHROSTOMY PLACEMENT RIGHT  04/06/2018  . LUMBAR PUNCTURE  03/01/2018  . MASTECTOMY Bilateral 2013  . MASTECTOMY WITH  AXILLARY LYMPH NODE DISSECTION Right 05/18/2012   Procedure: MASTECTOMY WITH AXILLARY LYMPH NODE DISSECTION;  Surgeon: Adin Hector, MD;  Location: [place];  Service: General;  Laterality: Right;  Bilateral Total Mastectomy , right axillary lymph node dissection left axillary sentinel node biopsy, removal of Porta Cath  . NEPHROSTOMY TUBE REMOVAL Bilateral 03/01/2018   removed tubes in dr. Erlene Quan office  . PORT-A-CATH REMOVAL N/A 05/18/2012   Procedure: REMOVAL PORT-A-CATH;  Surgeon: Adin Hector, MD;  Location: [place];  Service: General;  Laterality: N/A;  . PORTA CATH INSERTION N/A 12/26/2017   Procedure: PORTA CATH INSERTION;  Surgeon: Katha Cabal, MD;  Location: [place] CV LAB;  Service: Cardiovascular;  Laterality: N/A;  . PORTACATH PLACEMENT  12/02/2011   Procedure: INSERTION PORT-A-CATH;  Surgeon: Adin Hector, MD;  Location: [place];  Service: General;  Laterality: N/A;  insertion port a cath with fluoroscopy  . SIMPLE MASTECTOMY WITH AXILLARY SENTINEL NODE BIOPSY Left 05/18/2012   Procedure: SIMPLE MASTECTOMY WITH AXILLARY SENTINEL NODE BIOPSY;  Surgeon: Adin Hector, MD;  Location: [place];  Service: General;  Laterality: Left;    HEMATOLOGY/ONCOLOGY HISTORY:    Primary cancer of upper outer quadrant of left female breast ([place])   11/17/2011 Initial Diagnosis    Cancer of upper-outer quadrant of female breast diagnosed when pt presented with Nipple flattening and palpable lesion.    12/08/2011 - 03/29/2012 Chemotherapy    Neo adjuvant FEC X 4 followed by Weekly Taxol X 12    05/18/2012 Surgery    Bilateral Mastectomies: Right revealed invasive  Lobular carcinoma measuring 2.3 cm 8 of 17 lymph nodes were positive (T2 N2A). 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    07/04/2012 - 08/17/2012 Radiation Therapy    Rt Breast irradiation    08/19/2012 -  Anti-estrogen oral therapy    Initially Tamoxifen + Zolodex  and changed to Aromasin + Zolodex Monthly since 08/2013    12/22/2017 -  Chemotherapy    The patient had PACLitaxel (TAXOL) 144 mg in sodium chloride 0.9 % 250 mL chemo infusion (</= '80mg'$ /m2), 80 mg/m2 = 144 mg, Intravenous,  Once, 3 of 4 cycles Administration: 144 mg (12/22/2017), 144 mg (12/29/2017), 144 mg (01/05/2018), 144 mg (01/19/2018), 144 mg (01/26/2018), 144 mg (02/02/2018), 144 mg (04/05/2018), 144 mg (04/17/2018)  for chemotherapy treatment.     04/20/2018 -  Chemotherapy    The patient had methotrexate (PF) 12 mg in sodium chloride 0.9 % INTRATHECAL chemo injection, , Intrathecal,  Once, 0 of 6 cycles  for chemotherapy treatment.      Metastatic breast cancer ([place])   12/19/2017 Initial Diagnosis    Metastatic breast cancer ([place])  12/22/2017 -  Chemotherapy    The patient had PACLitaxel (TAXOL) 144 mg in sodium chloride 0.9 % 250 mL chemo infusion (</= 80mg /m2), 80 mg/m2 = 144 mg, Intravenous,  Once, 3 of 4 cycles Administration: 144 mg (12/22/2017), 144 mg (12/29/2017), 144 mg (01/05/2018), 144 mg (01/19/2018), 144 mg (01/26/2018), 144 mg (02/02/2018), 144 mg (04/05/2018), 144 mg (04/17/2018)  for chemotherapy treatment.     04/20/2018 -  Chemotherapy    The patient had methotrexate (PF) 12 mg in sodium chloride 0.9 % INTRATHECAL chemo injection, , Intrathecal,  Once, 0 of 6 cycles  for chemotherapy treatment.      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      ALLERGIES:  is allergic to adhesive [tape] and albumin (human).  MEDICATIONS:  Current Facility-Administered Medications  Medication Dose Route Frequency Provider Last Rate Last Dose  . 0.9 %  sodium chloride infusion  250 mL Intravenous PRN Salary, Montell D, MD      . acetaminophen (TYLENOL) tablet 650 mg  650 mg Oral Q6H PRN Fritzi Mandes, MD       Or  . acetaminophen (TYLENOL) suppository 650 mg  650 mg Rectal Q6H PRN Fritzi Mandes, MD      . antiseptic oral rinse (BIOTENE) solution 15 mL  15 mL Topical PRN Salary, Montell D, MD      . chlorproMAZINE (THORAZINE) 12.5 mg in sodium chloride 0.9 % 25 mL IVPB  12.5 mg Intravenous Q6H PRN Salary, Montell D, MD      . diphenhydrAMINE (BENADRYL) injection 25 mg  25 mg Intravenous Q6H PRN Salary, Montell D, MD   25 mg at 04/22/18 2209  . haloperidol (HALDOL) 2 MG/ML solution 0.5 mg  0.5 mg Sublingual Q4H PRN Salary, Montell D, MD      . HYDROmorphone (DILAUDID) injection 0.5 mg  0.5 mg Intravenous Q2H PRN Salary, Montell D, MD   0.5 mg at 04/26/18 1308  . levETIRAcetam (KEPPRA) 100 MG/ML solution 500 mg  500 mg Oral BID Loralie Malta, Kirt Boys, NP      . loperamide (IMODIUM) capsule 2 mg  2 mg Oral QID PRN Fritzi Mandes, MD      . LORazepam (ATIVAN) 2 MG/ML concentrated solution 1 mg  1 mg Sublingual Q4H PRN Salary, Montell D, MD   1 mg at 04/25/18 1828  . LORazepam (ATIVAN) injection 1 mg  1 mg Intravenous Q4H PRN Brightyn Mozer, Kirt Boys, NP      . magic mouthwash  15 mL Oral Q6H PRN Salary, Montell D, MD      . morphine CONCENTRATE 10 MG/0.5ML oral solution 10-20 mg  10-20 mg Oral Q1H PRN Rayder Sullenger, Kirt Boys, NP   10 mg at 04/25/18 1807  . naloxegol oxalate (MOVANTIK) tablet 25 mg  25 mg Oral Daily Javonda Suh, Kirt Boys, NP  25 mg at 04/26/18 0948  . nystatin (MYCOSTATIN/NYSTOP) topical powder   Topical TID PRN Salary, Montell D, MD      . ondansetron  (ZOFRAN-ODT) disintegrating tablet 4 mg  4 mg Oral Q6H PRN Salary, Montell D, MD       Or  . ondansetron (ZOFRAN) injection 4 mg  4 mg Intravenous Q6H PRN Salary, Montell D, MD      . oxybutynin (DITROPAN) tablet 2.5 mg  2.5 mg Oral QID PRN Salary, Montell D, MD      . polyvinyl alcohol (LIQUIFILM TEARS) 1.4 % ophthalmic solution 1 drop  1 drop Both Eyes QID PRN Salary, Montell D, MD      . prochlorperazine (COMPAZINE) tablet 10 mg  10 mg Oral Q6H PRN Fritzi Mandes, MD      . promethazine (PHENERGAN) injection 12.5 mg  12.5 mg Intravenous Q6H PRN Saundra Shelling, MD   12.5 mg at 04/23/18 2003  . scopolamine (TRANSDERM-SCOP) 1 MG/3DAYS 1.5 mg  1 patch Transdermal Q72H Fritzi Mandes, MD   1.5 mg at 04/25/18 0026  . sodium chloride flush (NS) 0.9 % injection 3 mL  3 mL Intravenous Q12H Salary, Montell D, MD   3 mL at 04/26/18 0949  . sodium chloride flush (NS) 0.9 % injection 3 mL  3 mL Intravenous PRN Salary, Holly Bodily D, MD   3 mL at 04/23/18 0910  . sodium phosphate (FLEET) 7-19 GM/118ML enema 1 enema  1 enema Rectal Daily PRN Saundra Shelling, MD   1 enema at 04/20/18 1130  . triamcinolone cream (KENALOG) 0.1 %   Topical BID PRN Salary, Montell D, MD        VITAL SIGNS: BP 126/76 (BP Location: Left Arm)   Pulse 92   Temp 98.3 F (36.8 C) (Oral)   Resp 14   Ht 5\' 5"  (1.651 m)   Wt 128 lb 6.4 oz (58.2 kg)   LMP 11/28/2011   SpO2 93%   BMI 21.37 kg/m  Filed Weights   04/18/18 1500 04/22/18 0633  Weight: 146 lb (66.2 kg) 128 lb 6.4 oz (58.2 kg)    Estimated body mass index is 21.37 kg/m as calculated from the following:   Height as of this encounter: 5\' 5"  (1.651 m).   Weight as of this encounter: 128 lb 6.4 oz (58.2 kg).  LABS: CBC:    Component Value Date/Time   WBC 7.1 04/21/2018 0543   HGB 10.7 (L) 04/21/2018 0543   HGB 14.8 04/29/2014 1327   HCT 32.6 (L) 04/21/2018 0543   HCT 43.8 04/29/2014 1327   PLT 425 (H) 04/21/2018 0543   PLT 206 04/29/2014 1327   MCV 91.1 04/21/2018 0543    MCV 95.0 04/29/2014 1327   NEUTROABS 3.0 04/17/2018 0815   NEUTROABS 3.9 04/29/2014 1327   LYMPHSABS 0.6 (L) 04/17/2018 0815   LYMPHSABS 1.7 04/29/2014 1327   MONOABS 0.4 04/17/2018 0815   MONOABS 0.6 04/29/2014 1327   EOSABS 0.0 04/17/2018 0815   EOSABS 0.1 04/29/2014 1327   BASOSABS 0.1 04/17/2018 0815   BASOSABS 0.0 04/29/2014 1327   Comprehensive Metabolic Panel:    Component Value Date/Time   NA 134 (L) 04/21/2018 0543   NA 138 12/16/2017   NA 141 04/29/2014 1327   K 4.6 04/21/2018 0543   K 4.0 04/29/2014 1327   CL 96 (L) 04/21/2018 0543   CL 106 08/20/2012 0802   CO2 30 04/21/2018 0543   CO2 24 04/29/2014 1327   BUN 18 04/21/2018  0543   BUN 30 (A) 12/16/2017   BUN 15.7 04/29/2014 1327   CREATININE 0.81 04/21/2018 0543   CREATININE 1.58 (H) 12/15/2017 1525   CREATININE 0.9 04/29/2014 1327   GLUCOSE 150 (H) 04/21/2018 0543   GLUCOSE 97 04/29/2014 1327   GLUCOSE 100 (H) 08/20/2012 0802   CALCIUM 8.4 (L) 04/21/2018 0543   CALCIUM 9.7 04/29/2014 1327   AST 72 (H) 04/18/2018 1503   AST 21 04/29/2014 1327   ALT 40 04/18/2018 1503   ALT 30 04/29/2014 1327   ALKPHOS 175 (H) 04/18/2018 1503   ALKPHOS 124 04/29/2014 1327   BILITOT 0.3 04/18/2018 1503   BILITOT 0.34 04/29/2014 1327   PROT 6.4 (L) 04/18/2018 1503   PROT 7.6 04/29/2014 1327   ALBUMIN 2.9 (L) 04/18/2018 1503   ALBUMIN 4.1 04/29/2014 1327    RADIOGRAPHIC STUDIES: Ct Head Wo Contrast  Result Date: 04/18/2018 CLINICAL DATA:  Syncopal episode, seizures, VP shunt EXAM: CT HEAD WITHOUT CONTRAST TECHNIQUE: Contiguous axial images were obtained from the base of the skull through the vertex without intravenous contrast. COMPARISON:  03/05/2018 MRI FINDINGS: Brain: Right frontal shunt tubing terminates in right lateral ventricle close to midline. There is diffuse enlargement of the ventricular system, difficult to exclude mild hydrocephalus or shunt malfunction. Ventricles remain enlarged and similar to the  03/05/2018 MR. Mild periventricular white matter microvascular ischemic changes. No acute intracranial hemorrhage, mass lesion, definite infarction, midline shift, herniation, extra-axial fluid collection. No focal mass effect or edema. Cisterns are patent. Cerebellar atrophy as well. Vascular: No hyperdense vessel or unexpected calcification. Skull: Normal. Negative for fracture or focal lesion. Sinuses/Orbits: No acute finding. Other: None. IMPRESSION: Interval right frontal shunt insertion. Similar diffuse ventricular enlargement of the entire ventricular system. Difficult to exclude hydrocephalus or shunt malfunction. Minor white matter microvascular changes Stable brain atrophy pattern. No other acute intracranial abnormality or hemorrhage. Electronically Signed   By: Jerilynn Mages.  Shick M.D.   On: 04/18/2018 20:20   Ct Chest Wo Contrast  Result Date: 04/04/2018 CLINICAL DATA:  Metastatic breast cancer. Status post bilateral mastectomy. EXAM: CT CHEST, ABDOMEN AND PELVIS WITHOUT CONTRAST TECHNIQUE: Multidetector CT imaging of the chest, abdomen and pelvis was performed following the standard protocol without IV contrast. COMPARISON:  02/06/2018 FINDINGS: CT CHEST FINDINGS Cardiovascular: The heart size is normal. No substantial pericardial effusion. Right Port-A-Cath tip is at the SVC/RA junction. Mediastinum/Nodes: No mediastinal lymphadenopathy. There is no hilar lymphadenopathy. Esophagus appears diffusely fluid-filled. There is no axillary lymphadenopathy. Lungs/Pleura: The central tracheobronchial airways are patent. Stable appearance of apparent scarring in the right apex. Mild atelectasis in the right middle and lower lobes is new in the interval. There is a new small right pleural effusion. No suspicious nodule or mass in the left lung. Minimal atelectasis noted left lower lobe. Tiny left pleural effusion evident. Musculoskeletal: Stable similar diffuse heterogeneous bone mineralization CT ABDOMEN PELVIS  FINDINGS Hepatobiliary: No focal abnormality in the liver parenchyma. High attenuation material in the gallbladder lumen may be related to stones or sludge. No intrahepatic or extrahepatic biliary dilation. Pancreas: No focal mass lesion. No dilatation of the main duct. No intraparenchymal cyst. No peripancreatic edema. Spleen: No splenomegaly. No focal mass lesion. Adrenals/Urinary Tract: Similar to the thickening of the left adrenal gland. Thickening of the right adrenal gland is also similar to prior. Bilateral percutaneous nephrostomy tubes been removed in the interval. There is mild to moderate right hydronephrosis with mild fullness of the left intrarenal collecting system. Neither ureter is distended beyond  its midpoint. Urinary bladder is decompressed. Stomach/Bowel: Stomach is nondistended. No gastric wall thickening. No evidence of outlet obstruction. Duodenum is normally positioned as is the ligament of Treitz. No small bowel wall thickening. No small bowel dilatation. The terminal ileum is normal. The appendix is not visualized, but there is no edema or inflammation in the region of the cecum. Colon is diffusely decompressed. Vascular/Lymphatic: No abdominal aortic aneurysm. No retroperitoneal lymphadenopathy. No pelvic sidewall lymphadenopathy. Reproductive: But The uterus is unremarkable. There is no adnexal mass. Other: Moderate to large volume ascites is new in the interval. There is vascular congestion/nodularity in the gastrocolic ligament and omentum suggestion of peritoneal thickening seen in the left pelvis (96/2) and posterior pelvis bilaterally (102/2). Musculoskeletal: Similar appearance of heterogeneous bone mineralization with some more densely sclerotic lesions identified throughout as examples, see L5 vertebral body (84/2) left pubic bone (112/2) and posterior left inferior pubic ramus (118/2). IMPRESSION: 1. Interval development of moderate to large volume ascites with congestion and  nodularity in the gastrocolic ligament and omentum. Imaging features highly concerning for metastatic involvement. 2. Interval removal of percutaneous nephrostomy tubes with mild to moderate right hydronephrosis and mild fullness of the left intrarenal collecting system. 3. Diffuse heterogeneous bone mineralization with scattered sclerotic lesion similar to prior and compatible with metastatic disease. 4. Small right and tiny left pleural effusions. 5. Urinary bladder decompressed on today's study and not well evaluated. Electronically Signed   By: Misty Stanley M.D.   On: 04/04/2018 11:55   US Renal  Result Date: 04/19/2018 CLINICAL DATA:  Nephrostomy status. EXAM: RENAL / URINARY TRACT ULTRASOUND COMPLETE COMPARISON:  None. FINDINGS: Right Kidney: Renal measurements: 11.6 x 5 x 4.3 cm = volume: 129 mL . Echogenicity within normal limits. Mild hydronephrosis is noted. Nephrostomy tube is present. Left Kidney: Renal measurements: 11.3 x 5.3 x 3.9 cm = volume: 121 mL. Echogenicity within normal limits. Moderate hydronephrosis is noted Bladder: Appears normal for degree of bladder distention. No ureteral jets are noted. Ascites are noted in the abdomen pelvis. Bilateral pleural effusions are noted. IMPRESSION: Right nephrostomy tube is identified. There is mild right hydronephrosis. Moderate left hydronephrosis is noted. Ascites identified in the abdomen and pelvis. Bilateral pleural effusions. Electronically Signed   By: Abelardo Diesel M.D.   On: 04/19/2018 11:35   US Renal  Result Date: 04/03/2018 CLINICAL DATA:  Acute kidney injury. EXAM: RENAL / URINARY TRACT ULTRASOUND COMPLETE COMPARISON:  CT of the abdomen and pelvis 02/06/2018 FINDINGS: Right Kidney: Renal measurements: 12.7 x 4.7 x 5.7 cm = volume: 171 mL. Moderate to severe recurrent right-sided hydronephrosis is present. No obstructing lesion or mass is present. Moderate renal thinning is present. Left Kidney: Renal measurements: 11.5 x 4.3 x 5.0 cm  = volume: 129 mL. Moderate left-sided hydronephrosis is present. Parenchymal thickness is normal. Bladder: Under distended. Moderate diffuse abdominal ascites is present. Bilateral pleural effusions are noted. IMPRESSION: 1. Recurrent bilateral hydronephrosis, right greater than left. 2. Moderate diffuse abdominal ascites. 3. Bilateral pleural effusions. Electronically Signed   By: San Morelle M.D.   On: 04/03/2018 16:48   US Paracentesis  Result Date: 04/20/2018 INDICATION: Patient with history of metastatic breast carcinoma with recurrent malignant ascites. Request made for therapeutic paracentesis. EXAM: ULTRASOUND GUIDED THERAPEUTIC PARACENTESIS MEDICATIONS: None COMPLICATIONS: None immediate. PROCEDURE: Informed written consent was obtained from the patient after a discussion of the risks, benefits and alternatives to treatment. A timeout was performed prior to the initiation of the procedure. Initial ultrasound scanning demonstrates  a moderate to large amount of ascites within the left lower abdominal quadrant. The left lower abdomen was prepped and draped in the usual sterile fashion. 1% lidocaine was used for local anesthesia. Following this, a 6 Fr Safe-T-Centesis catheter was introduced. An ultrasound image was saved for documentation purposes. The paracentesis was performed. The catheter was removed and a dressing was applied. The patient tolerated the procedure well without immediate post procedural complication. FINDINGS: A total of approximately 3.2 liters of slightly hazy, yellow fluid was removed. IMPRESSION: Successful ultrasound-guided therapeutic paracentesis yielding 3.2 liters of peritoneal fluid. Read by: Rowe Robert, PA-C Electronically Signed   By: Jerilynn Mages.  Shick M.D.   On: 04/20/2018 10:58   US Paracentesis  Result Date: 04/06/2018 INDICATION: Metastatic breast cancer, ascites, abdominal distension EXAM: ULTRASOUND GUIDED RIGHT PARACENTESIS MEDICATIONS: 1% LIDOCAINE LOCAL  COMPLICATIONS: None immediate. PROCEDURE: Informed written consent was obtained from the patient after a discussion of the risks, benefits and alternatives to treatment. A timeout was performed prior to the initiation of the procedure. Initial ultrasound scanning demonstrates a large amount of ascites within the right lower abdominal quadrant. The right lower abdomen was prepped and draped in the usual sterile fashion. 1% lidocaine with epinephrine was used for local anesthesia. Following this, a 6 Fr Safe-T-Centesis catheter was introduced. An ultrasound image was saved for documentation purposes. The paracentesis was performed. The catheter was removed and a dressing was applied. The patient tolerated the procedure well without immediate post procedural complication. FINDINGS: A total of approximately 4.2 L of PERITONEAL fluid was removed. Samples were sent to the laboratory as requested by the clinical team. IMPRESSION: Successful ultrasound-guided paracentesis yielding 4.2 liters of peritoneal fluid. Electronically Signed   By: Jerilynn Mages.  Shick M.D.   On: 04/06/2018 11:19   Dg Abd 2 Views  Result Date: 04/18/2018 CLINICAL DATA:  Initial evaluation for constipation. EXAM: ABDOMEN - 2 VIEW COMPARISON:  None. FINDINGS: Paucity of gas limits evaluation of the bowels. No evidence for obstruction. No free air on upright view. No abnormal bowel wall thickening. Moderate retained stool within the colon, most notable at the splenic flexure. No soft tissue mass or abnormal calcification. Right-sided percutaneous nephrostomy tube in place. No acute osseous abnormality. Right basilar atelectasis noted. Right-sided Port-A-Cath noted as well. IMPRESSION: Nonobstructive bowel gas pattern with moderate retained stool within the colon. Electronically Signed   By: Jeannine Boga M.D.   On: 04/18/2018 17:44   Ct Renal Stone Study  Result Date: 04/04/2018 CLINICAL DATA:  Metastatic breast cancer. Status post bilateral  mastectomy. EXAM: CT CHEST, ABDOMEN AND PELVIS WITHOUT CONTRAST TECHNIQUE: Multidetector CT imaging of the chest, abdomen and pelvis was performed following the standard protocol without IV contrast. COMPARISON:  02/06/2018 FINDINGS: CT CHEST FINDINGS Cardiovascular: The heart size is normal. No substantial pericardial effusion. Right Port-A-Cath tip is at the SVC/RA junction. Mediastinum/Nodes: No mediastinal lymphadenopathy. There is no hilar lymphadenopathy. Esophagus appears diffusely fluid-filled. There is no axillary lymphadenopathy. Lungs/Pleura: The central tracheobronchial airways are patent. Stable appearance of apparent scarring in the right apex. Mild atelectasis in the right middle and lower lobes is new in the interval. There is a new small right pleural effusion. No suspicious nodule or mass in the left lung. Minimal atelectasis noted left lower lobe. Tiny left pleural effusion evident. Musculoskeletal: Stable similar diffuse heterogeneous bone mineralization CT ABDOMEN PELVIS FINDINGS Hepatobiliary: No focal abnormality in the liver parenchyma. High attenuation material in the gallbladder lumen may be related to stones or sludge. No intrahepatic  or extrahepatic biliary dilation. Pancreas: No focal mass lesion. No dilatation of the main duct. No intraparenchymal cyst. No peripancreatic edema. Spleen: No splenomegaly. No focal mass lesion. Adrenals/Urinary Tract: Similar to the thickening of the left adrenal gland. Thickening of the right adrenal gland is also similar to prior. Bilateral percutaneous nephrostomy tubes been removed in the interval. There is mild to moderate right hydronephrosis with mild fullness of the left intrarenal collecting system. Neither ureter is distended beyond its midpoint. Urinary bladder is decompressed. Stomach/Bowel: Stomach is nondistended. No gastric wall thickening. No evidence of outlet obstruction. Duodenum is normally positioned as is the ligament of Treitz. No  small bowel wall thickening. No small bowel dilatation. The terminal ileum is normal. The appendix is not visualized, but there is no edema or inflammation in the region of the cecum. Colon is diffusely decompressed. Vascular/Lymphatic: No abdominal aortic aneurysm. No retroperitoneal lymphadenopathy. No pelvic sidewall lymphadenopathy. Reproductive: But The uterus is unremarkable. There is no adnexal mass. Other: Moderate to large volume ascites is new in the interval. There is vascular congestion/nodularity in the gastrocolic ligament and omentum suggestion of peritoneal thickening seen in the left pelvis (96/2) and posterior pelvis bilaterally (102/2). Musculoskeletal: Similar appearance of heterogeneous bone mineralization with some more densely sclerotic lesions identified throughout as examples, see L5 vertebral body (84/2) left pubic bone (112/2) and posterior left inferior pubic ramus (118/2). IMPRESSION: 1. Interval development of moderate to large volume ascites with congestion and nodularity in the gastrocolic ligament and omentum. Imaging features highly concerning for metastatic involvement. 2. Interval removal of percutaneous nephrostomy tubes with mild to moderate right hydronephrosis and mild fullness of the left intrarenal collecting system. 3. Diffuse heterogeneous bone mineralization with scattered sclerotic lesion similar to prior and compatible with metastatic disease. 4. Small right and tiny left pleural effusions. 5. Urinary bladder decompressed on today's study and not well evaluated. Electronically Signed   By: Misty Stanley M.D.   On: 04/04/2018 11:55   Ir Nephrostomy Placement Right  Result Date: 04/06/2018 INDICATION: Metastatic breast cancer, recurrent right hydronephrosis EXAM: ULTRASOUND AND FLUOROSCOPIC RIGHT NEPHROSTOMY INSERTION COMPARISON:  04/04/2018 MEDICATIONS: CIPRO 400 MG; The antibiotic was administered in an appropriate time frame prior to skin puncture.  ANESTHESIA/SEDATION: Fentanyl 50 mcg IV; Versed 2 mg IV Moderate Sedation Time:  11 minutes The patient was continuously monitored during the procedure by the interventional radiology nurse under my direct supervision. CONTRAST:  10 cc-administered into the collecting system(s) FLUOROSCOPY TIME:  Fluoroscopy Time: 0 minutes 30 seconds ( mGy). COMPLICATIONS: None immediate. PROCEDURE: Informed written consent was obtained from the patient after a thorough discussion of the procedural risks, benefits and alternatives. All questions were addressed. Maximal Sterile Barrier Technique was utilized including caps, mask, sterile gowns, sterile gloves, sterile drape, hand hygiene and skin antiseptic. A timeout was performed prior to the initiation of the procedure. Previous imaging reviewed. Patient position prone. Right hydronephrotic kidney was localized and marked. Under sterile conditions anesthesia, an 18 gauge introducer needle was advanced into a dilated mid to lower pole calyx. Needle position confirmed with ultrasound. Images obtained for documentation. There was return of urine. Guidewire inserted followed by tract dilatation to insert a 10 French nephrostomy. Retention loop formed the renal pelvis. Contrast injection confirms position. Images obtained for documentation. Catheter secured with a prolene suture and a sterile dressing. Gravity drainage bag connected. No immediate complication. Patient tolerated the procedure well. IMPRESSION: Successful ultrasound and fluoroscopic 10 French right nephrostomy insertion Electronically Signed   By:  M.  Shick M.D.   On: 04/06/2018 12:05    PERFORMANCE STATUS (ECOG) : 4 - Bedbound  Review of Systems As noted above. Otherwise, a complete review of systems is negative.  Physical Exam General: ill appearing Pulmonary: unlabored GU: no suprapubic tenderness Extremities: no edema, no joint deformities Skin: no rashes Neurological: lethargic  IMPRESSION: Patient  resting comfortably. Spoke with caregiver, who was at bedside. No family present.   Patient is on waiting list for Hospice Home. Case discussed with hospice liaison and nurse.    PLAN: 1.  Hospice Home when bed is available.  2. Comfort measures 3.  DNR  Time Total: 15 minutes  Visit consisted of counseling and education dealing with the complex and emotionally intense issues of symptom management and palliative care in the setting of serious and potentially life-threatening illness.Greater than 50%  of this time was spent counseling and coordinating care related to the above assessment and plan.  Signed by: Altha Harm, PhD, NP-C [id number] (Work Cell)

## 2018-04-27 ENCOUNTER — Ambulatory Visit: Payer: 59

## 2018-04-27 MED ORDER — LORAZEPAM 2 MG/ML IJ SOLN: 1.0000 mg | INTRAMUSCULAR | 0 refills | Status: AC | PRN

## 2018-04-27 MED ORDER — LEVETIRACETAM 100 MG/ML PO SOLN: 500.0000 mg | Freq: Two times a day (BID) | ORAL | 0 refills | Status: AC

## 2018-04-27 MED ORDER — NALOXEGOL OXALATE 25 MG PO TABS: 25.0000 mg | ORAL_TABLET | Freq: Every day | ORAL | 0 refills | Status: AC

## 2018-04-27 MED ORDER — SCOPOLAMINE 1 MG/3DAYS TD PT72: 1.0000 | MEDICATED_PATCH | TRANSDERMAL | 0 refills | Status: AC

## 2018-04-27 MED ORDER — LORAZEPAM 2 MG/ML PO CONC: 1.0000 mg | ORAL | 0 refills | Status: AC | PRN

## 2018-04-27 MED ORDER — HYDROMORPHONE HCL 1 MG/ML IJ SOLN: 0.5000 mg | INTRAMUSCULAR | 0 refills | Status: AC | PRN

## 2018-04-27 NOTE — Progress Notes (Signed)
[place]  [phone] [phone]   Name: RUBY LOGIUDICE Date: 04/27/2018 MRN: [id number]  DOB: Oct 12, 1960  Patient Care Team: [name], MD as PCP - General (Internal Medicine) Barrington Ellison, RN as [facility]    REASON FOR CONSULTATION: Palliative Care consult requested for this 58 y.o. female with multiple medical problems including stage IV breast cancer (initially diagnosed in August 2013) status post neoadjuvant chemotherapy and bilateral mastectomy and adjuvant RT.  Patient was found to have disease with recurrence in September 2019 when she developed obstructive nephropathy requiring nephrostomy tubes.  Patient was found to have metastatic disease to bone, bladder wall, and peritoneum.  She was started on Taxol treatment.  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  She is status post Ommaya reservoir (placed on 04/13/2018) with plan to receive intrathecal methotrexate.  PMH also notable for CKD stage II, history of DVT (11/2017) on Eliquis, ascites requiring paracentesis, IBS, anxiety, and depression.  Patient was recently hospitalized 04/07/19/18/20 with lysed weakness and acute kidney injury secondary to dehydration.  Patient's performance status has rapidly declined and she is now bedbound.  She has had transient periods of unresponsiveness and was admitted to the hospital on 04/18/2018 for evaluation.  Palliative care was consulted to help address goals.  CODE STATUS: Full code  PAST MEDICAL HISTORY: Past Medical History:  Diagnosis Date  . Anxiety    takes Xanax prn  . Breast cancer [facility]) August 2013   bilateral, er/pr+, bladder (02/2018..mets from breast ca)  . Chronic kidney disease 01/2018   recent shut down of kidneys with labs out of whack  . Complication of anesthesia    pt  states she had the shakes extremely bad  . Cough    at night d/t reflux;nothing productive  . Dental crowns present    x 4  . Depression   . GERD (gastroesophageal reflux disease)    takes Nexium daily  . History of bronchitis    08/2011  . History of colon polyps   . Hot flashes, menopausal 08/08/2012  . IBS (irritable bowel syndrome)    immodium prn  . Insomnia    takes Ambien nightly  . Irritable bowel syndrome   . 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 Neutropenia, drug-induced ([place]) 12/15/2011  . Nocturia   . Plantar fasciitis   . S/P radiation therapy 07/03/2012 through 08/18/2038                                                        Right chest wall/regional lymph nodes 5040 cGy 28 sessions, right chest wall/mastectomy scar boost 1000 cGy 5 sessions                          . Status post chemotherapy    FEC 100 starting on 12/08/2011 - 01/18/12/single agent Taxol x12 weeks from 02/02/2012 through January 2014   . Urinary frequency   . Use of tamoxifen (Nolvadex)    now stopped. will be starting ibrance on 03/05/18  . Vertigo     PAST SURGICAL HISTORY:  Past Surgical History:  Procedure Laterality Date  . BREAST LUMPECTOMY  2008  left  . COLONOSCOPY    . COLONOSCOPY WITH PROPOFOL N/A 10/10/2017   Procedure: COLONOSCOPY WITH PROPOFOL;  Surgeon: Lucilla Lame, MD;  Location: [facility] ENDOSCOPY;  Service: Endoscopy;  Laterality: N/A;  . CYSTOSCOPY WITH STENT PLACEMENT Bilateral 12/17/2017   Procedure: CYSTOSCOPY WITH STENT PLACEMENT;  Surgeon: Hollice Espy, MD;  Location: [facility] ORS;  Service: Urology;  Laterality: Bilateral;  . IR NEPHROSTOMY EXCHANGE LEFT  02/13/2018  . IR NEPHROSTOMY EXCHANGE RIGHT  01/02/2018  . IR NEPHROSTOMY EXCHANGE RIGHT  02/13/2018  . IR NEPHROSTOMY PLACEMENT LEFT  12/19/2017  . IR NEPHROSTOMY PLACEMENT RIGHT  12/19/2017  . IR NEPHROSTOMY PLACEMENT RIGHT  04/06/2018  . LUMBAR PUNCTURE  03/01/2018  . MASTECTOMY Bilateral 2013  . MASTECTOMY WITH  AXILLARY LYMPH NODE DISSECTION Right 05/18/2012   Procedure: MASTECTOMY WITH AXILLARY LYMPH NODE DISSECTION;  Surgeon: Adin Hector, MD;  Location: [place];  Service: General;  Laterality: Right;  Bilateral Total Mastectomy , right axillary lymph node dissection left axillary sentinel node biopsy, removal of Porta Cath  . NEPHROSTOMY TUBE REMOVAL Bilateral 03/01/2018   removed tubes in dr. Erlene Quan office  . PORT-A-CATH REMOVAL N/A 05/18/2012   Procedure: REMOVAL PORT-A-CATH;  Surgeon: Adin Hector, MD;  Location: [place];  Service: General;  Laterality: N/A;  . PORTA CATH INSERTION N/A 12/26/2017   Procedure: PORTA CATH INSERTION;  Surgeon: Katha Cabal, MD;  Location: [place] CV LAB;  Service: Cardiovascular;  Laterality: N/A;  . PORTACATH PLACEMENT  12/02/2011   Procedure: INSERTION PORT-A-CATH;  Surgeon: Adin Hector, MD;  Location: [place];  Service: General;  Laterality: N/A;  insertion port a cath with fluoroscopy  . SIMPLE MASTECTOMY WITH AXILLARY SENTINEL NODE BIOPSY Left 05/18/2012   Procedure: SIMPLE MASTECTOMY WITH AXILLARY SENTINEL NODE BIOPSY;  Surgeon: Adin Hector, MD;  Location: [place];  Service: General;  Laterality: Left;    HEMATOLOGY/ONCOLOGY HISTORY:    Primary cancer of upper outer quadrant of left female breast ([place])   11/17/2011 Initial Diagnosis    Cancer of upper-outer quadrant of female breast diagnosed when pt presented with Nipple flattening and palpable lesion.    12/08/2011 - 03/29/2012 Chemotherapy    Neo adjuvant FEC X 4 followed by Weekly Taxol X 12    05/18/2012 Surgery    Bilateral Mastectomies: Right revealed invasive  Lobular carcinoma measuring 2.3 cm 8 of 17 lymph nodes were positive (T2 N2A). 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    07/04/2012 - 08/17/2012 Radiation Therapy    Rt Breast irradiation    08/19/2012 -  Anti-estrogen oral therapy    Initially Tamoxifen + Zolodex  and changed to Aromasin + Zolodex Monthly since 08/2013    12/22/2017 -  Chemotherapy    The patient had PACLitaxel (TAXOL) 144 mg in sodium chloride 0.9 % 250 mL chemo infusion (</= '80mg'$ /m2), 80 mg/m2 = 144 mg, Intravenous,  Once, 3 of 4 cycles Administration: 144 mg (12/22/2017), 144 mg (12/29/2017), 144 mg (01/05/2018), 144 mg (01/19/2018), 144 mg (01/26/2018), 144 mg (02/02/2018), 144 mg (04/05/2018), 144 mg (04/17/2018)  for chemotherapy treatment.     04/20/2018 -  Chemotherapy    The patient had methotrexate (PF) 12 mg in sodium chloride 0.9 % INTRATHECAL chemo injection, , Intrathecal,  Once, 0 of 6 cycles  for chemotherapy treatment.      Metastatic breast cancer ([place])   12/19/2017 Initial Diagnosis    Metastatic breast cancer ([place])  12/22/2017 -  Chemotherapy    The patient had PACLitaxel (TAXOL) 144 mg in sodium chloride 0.9 % 250 mL chemo infusion (</= 80mg /m2), 80 mg/m2 = 144 mg, Intravenous,  Once, 3 of 4 cycles Administration: 144 mg (12/22/2017), 144 mg (12/29/2017), 144 mg (01/05/2018), 144 mg (01/19/2018), 144 mg (01/26/2018), 144 mg (02/02/2018), 144 mg (04/05/2018), 144 mg (04/17/2018)  for chemotherapy treatment.     04/20/2018 -  Chemotherapy    The patient had methotrexate (PF) 12 mg in sodium chloride 0.9 % INTRATHECAL chemo injection, , Intrathecal,  Once, 0 of 6 cycles  for chemotherapy treatment.      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      ALLERGIES:  is allergic to adhesive [tape] and albumin (human).  MEDICATIONS:  No current facility-administered medications for this encounter.    Current Outpatient Medications  Medication Sig Dispense Refill  . apixaban (ELIQUIS) 2.5 MG TABS tablet Take 1 tablet (2.5 mg total) by mouth 2 (two) times daily. 60 tablet 3  . senna (SENOKOT) 8.6 MG TABS tablet Take 2 tablets (17.2 mg total) by mouth daily. 120 each 0  . HYDROmorphone (DILAUDID) 1 MG/ML injection Inject 0.5 mLs (0.5 mg total) into the vein every 2 (two) hours as needed for severe pain (or dyspnea). 20 mL 0  . levETIRAcetam (KEPPRA) 100 MG/ML solution Take 5 mLs (500 mg total) by mouth 2 (two) times daily. 473 mL 0  . LORazepam (ATIVAN) 2 MG/ML concentrated solution Place 0.5 mLs (1 mg total) under the tongue every 4 (four) hours as needed for anxiety, seizure, sedation or sleep (Agitation, shortness of breath). 30 mL 0  . LORazepam (ATIVAN) 2 MG/ML injection Inject 0.5 mLs (1 mg total) into the vein every 4 (four) hours as needed for anxiety, seizure or sedation. 20 mL 0  . naloxegol oxalate (MOVANTIK) 25 MG TABS tablet Take 1 tablet (25 mg total) by mouth daily. 30 tablet 0  . ondansetron (ZOFRAN-ODT) 4 MG disintegrating tablet Take 1 tablet (4 mg total) by mouth every 6 (six) hours as needed for nausea. 20 tablet 0  . scopolamine (TRANSDERM-SCOP) 1 MG/3DAYS Place 1 patch (1.5 mg total) onto the skin every 3 (three) days. 10 patch 0    VITAL SIGNS: BP (!) 141/86 (BP Location: Left Arm)   Pulse 88   Temp 98.2 F (36.8 C) (Oral)   Resp 18   Ht 5\' 5"  (1.651 m)   Wt 128 lb 6.4 oz (58.2 kg)   LMP 11/28/2011   SpO2 95%   BMI 21.37 kg/m  Filed Weights   04/18/18 1500 04/22/18 0633  Weight: 146 lb (66.2 kg) 128 lb 6.4 oz (58.2 kg)    Estimated body mass index is 21.37 kg/m as calculated from the following:    Height as of this encounter: 5'  5" (1.651 m).   Weight as of this encounter: 128 lb 6.4 oz (58.2 kg).  LABS: CBC:    Component Value Date/Time   WBC 7.1 04/21/2018 0543   HGB 10.7 (L) 04/21/2018 0543   HGB 14.8 04/29/2014 1327   HCT 32.6 (L) 04/21/2018 0543   HCT 43.8 04/29/2014 1327   PLT 425 (H) 04/21/2018 0543   PLT 206 04/29/2014 1327   MCV 91.1 04/21/2018 0543   MCV 95.0 04/29/2014 1327   NEUTROABS 3.0 04/17/2018 0815   NEUTROABS 3.9 04/29/2014 1327   LYMPHSABS 0.6 (L) 04/17/2018 0815   LYMPHSABS 1.7 04/29/2014 1327   MONOABS 0.4 04/17/2018 0815   MONOABS 0.6 04/29/2014 1327   EOSABS 0.0 04/17/2018 0815   EOSABS 0.1 04/29/2014 1327   BASOSABS 0.1 04/17/2018 0815   BASOSABS 0.0 04/29/2014 1327   Comprehensive Metabolic Panel:    Component Value Date/Time   NA 134 (L) 04/21/2018 0543   NA 138 12/16/2017   NA 141 04/29/2014 1327   K 4.6 04/21/2018 0543   K 4.0 04/29/2014 1327   CL 96 (L) 04/21/2018 0543   CL 106 08/20/2012 0802   CO2 30 04/21/2018 0543   CO2 24 04/29/2014 1327   BUN 18 04/21/2018 0543   BUN 30 (A) 12/16/2017   BUN 15.7 04/29/2014 1327   CREATININE 0.81 04/21/2018 0543   CREATININE 1.58 (H) 12/15/2017 1525   CREATININE 0.9 04/29/2014 1327   GLUCOSE 150 (H) 04/21/2018 0543   GLUCOSE 97 04/29/2014 1327   GLUCOSE 100 (H) 08/20/2012 0802   CALCIUM 8.4 (L) 04/21/2018 0543   CALCIUM 9.7 04/29/2014 1327   AST 72 (H) 04/18/2018 1503   AST 21 04/29/2014 1327   ALT 40 04/18/2018 1503   ALT 30 04/29/2014 1327   ALKPHOS 175 (H) 04/18/2018 1503   ALKPHOS 124 04/29/2014 1327   BILITOT 0.3 04/18/2018 1503   BILITOT 0.34 04/29/2014 1327   PROT 6.4 (L) 04/18/2018 1503   PROT 7.6 04/29/2014 1327   ALBUMIN 2.9 (L) 04/18/2018 1503   ALBUMIN 4.1 04/29/2014 1327    RADIOGRAPHIC STUDIES: Ct Head Wo Contrast  Result Date: 04/18/2018 CLINICAL DATA:  Syncopal episode, seizures, VP shunt EXAM: CT HEAD WITHOUT CONTRAST TECHNIQUE: Contiguous axial images were  obtained from the base of the skull through the vertex without intravenous contrast. COMPARISON:  03/05/2018 MRI FINDINGS: Brain: Right frontal shunt tubing terminates in right lateral ventricle close to midline. There is diffuse enlargement of the ventricular system, difficult to exclude mild hydrocephalus or shunt malfunction. Ventricles remain enlarged and similar to the 03/05/2018 MR. Mild periventricular white matter microvascular ischemic changes. No acute intracranial hemorrhage, mass lesion, definite infarction, midline shift, herniation, extra-axial fluid collection. No focal mass effect or edema. Cisterns are patent. Cerebellar atrophy as well. Vascular: No hyperdense vessel or unexpected calcification. Skull: Normal. Negative for fracture or focal lesion. Sinuses/Orbits: No acute finding. Other: None. IMPRESSION: Interval right frontal shunt insertion. Similar diffuse ventricular enlargement of the entire ventricular system. Difficult to exclude hydrocephalus or shunt malfunction. Minor white matter microvascular changes Stable brain atrophy pattern. No other acute intracranial abnormality or hemorrhage. Electronically Signed   By: Jerilynn Mages.  Shick M.D.   On: 04/18/2018 20:20   Ct Chest Wo Contrast  Result Date: 04/04/2018 CLINICAL DATA:  Metastatic breast cancer. Status post bilateral mastectomy. EXAM: CT CHEST, ABDOMEN AND PELVIS WITHOUT CONTRAST TECHNIQUE: Multidetector CT imaging of the chest, abdomen and pelvis was performed following the standard protocol without IV contrast. COMPARISON:  02/06/2018  FINDINGS: CT CHEST FINDINGS Cardiovascular: The heart size is normal. No substantial pericardial effusion. Right Port-A-Cath tip is at the SVC/RA junction. Mediastinum/Nodes: No mediastinal lymphadenopathy. There is no hilar lymphadenopathy. Esophagus appears diffusely fluid-filled. There is no axillary lymphadenopathy. Lungs/Pleura: The central tracheobronchial airways are patent. Stable appearance of  apparent scarring in the right apex. Mild atelectasis in the right middle and lower lobes is new in the interval. There is a new small right pleural effusion. No suspicious nodule or mass in the left lung. Minimal atelectasis noted left lower lobe. Tiny left pleural effusion evident. Musculoskeletal: Stable similar diffuse heterogeneous bone mineralization CT ABDOMEN PELVIS FINDINGS Hepatobiliary: No focal abnormality in the liver parenchyma. High attenuation material in the gallbladder lumen may be related to stones or sludge. No intrahepatic or extrahepatic biliary dilation. Pancreas: No focal mass lesion. No dilatation of the main duct. No intraparenchymal cyst. No peripancreatic edema. Spleen: No splenomegaly. No focal mass lesion. Adrenals/Urinary Tract: Similar to the thickening of the left adrenal gland. Thickening of the right adrenal gland is also similar to prior. Bilateral percutaneous nephrostomy tubes been removed in the interval. There is mild to moderate right hydronephrosis with mild fullness of the left intrarenal collecting system. Neither ureter is distended beyond its midpoint. Urinary bladder is decompressed. Stomach/Bowel: Stomach is nondistended. No gastric wall thickening. No evidence of outlet obstruction. Duodenum is normally positioned as is the ligament of Treitz. No small bowel wall thickening. No small bowel dilatation. The terminal ileum is normal. The appendix is not visualized, but there is no edema or inflammation in the region of the cecum. Colon is diffusely decompressed. Vascular/Lymphatic: No abdominal aortic aneurysm. No retroperitoneal lymphadenopathy. No pelvic sidewall lymphadenopathy. Reproductive: But The uterus is unremarkable. There is no adnexal mass. Other: Moderate to large volume ascites is new in the interval. There is vascular congestion/nodularity in the gastrocolic ligament and omentum suggestion of peritoneal thickening seen in the left pelvis (96/2) and  posterior pelvis bilaterally (102/2). Musculoskeletal: Similar appearance of heterogeneous bone mineralization with some more densely sclerotic lesions identified throughout as examples, see L5 vertebral body (84/2) left pubic bone (112/2) and posterior left inferior pubic ramus (118/2). IMPRESSION: 1. Interval development of moderate to large volume ascites with congestion and nodularity in the gastrocolic ligament and omentum. Imaging features highly concerning for metastatic involvement. 2. Interval removal of percutaneous nephrostomy tubes with mild to moderate right hydronephrosis and mild fullness of the left intrarenal collecting system. 3. Diffuse heterogeneous bone mineralization with scattered sclerotic lesion similar to prior and compatible with metastatic disease. 4. Small right and tiny left pleural effusions. 5. Urinary bladder decompressed on today's study and not well evaluated. Electronically Signed   By: Misty Stanley M.D.   On: 04/04/2018 11:55   US Renal  Result Date: 04/19/2018 CLINICAL DATA:  Nephrostomy status. EXAM: RENAL / URINARY TRACT ULTRASOUND COMPLETE COMPARISON:  None. FINDINGS: Right Kidney: Renal measurements: 11.6 x 5 x 4.3 cm = volume: 129 mL . Echogenicity within normal limits. Mild hydronephrosis is noted. Nephrostomy tube is present. Left Kidney: Renal measurements: 11.3 x 5.3 x 3.9 cm = volume: 121 mL. Echogenicity within normal limits. Moderate hydronephrosis is noted Bladder: Appears normal for degree of bladder distention. No ureteral jets are noted. Ascites are noted in the abdomen pelvis. Bilateral pleural effusions are noted. IMPRESSION: Right nephrostomy tube is identified. There is mild right hydronephrosis. Moderate left hydronephrosis is noted. Ascites identified in the abdomen and pelvis. Bilateral pleural effusions. Electronically Signed   By:  Abelardo Diesel M.D.   On: 04/19/2018 11:35   US Renal  Result Date: 04/03/2018 CLINICAL DATA:  Acute kidney injury.  EXAM: RENAL / URINARY TRACT ULTRASOUND COMPLETE COMPARISON:  CT of the abdomen and pelvis 02/06/2018 FINDINGS: Right Kidney: Renal measurements: 12.7 x 4.7 x 5.7 cm = volume: 171 mL. Moderate to severe recurrent right-sided hydronephrosis is present. No obstructing lesion or mass is present. Moderate renal thinning is present. Left Kidney: Renal measurements: 11.5 x 4.3 x 5.0 cm = volume: 129 mL. Moderate left-sided hydronephrosis is present. Parenchymal thickness is normal. Bladder: Under distended. Moderate diffuse abdominal ascites is present. Bilateral pleural effusions are noted. IMPRESSION: 1. Recurrent bilateral hydronephrosis, right greater than left. 2. Moderate diffuse abdominal ascites. 3. Bilateral pleural effusions. Electronically Signed   By: San Morelle M.D.   On: 04/03/2018 16:48   US Paracentesis  Result Date: 04/20/2018 INDICATION: Patient with history of metastatic breast carcinoma with recurrent malignant ascites. Request made for therapeutic paracentesis. EXAM: ULTRASOUND GUIDED THERAPEUTIC PARACENTESIS MEDICATIONS: None COMPLICATIONS: None immediate. PROCEDURE: Informed written consent was obtained from the patient after a discussion of the risks, benefits and alternatives to treatment. A timeout was performed prior to the initiation of the procedure. Initial ultrasound scanning demonstrates a moderate to large amount of ascites within the left lower abdominal quadrant. The left lower abdomen was prepped and draped in the usual sterile fashion. 1% lidocaine was used for local anesthesia. Following this, a 6 Fr Safe-T-Centesis catheter was introduced. An ultrasound image was saved for documentation purposes. The paracentesis was performed. The catheter was removed and a dressing was applied. The patient tolerated the procedure well without immediate post procedural complication. FINDINGS: A total of approximately 3.2 liters of slightly hazy, yellow fluid was removed. IMPRESSION:  Successful ultrasound-guided therapeutic paracentesis yielding 3.2 liters of peritoneal fluid. Read by: Rowe Robert, PA-C Electronically Signed   By: Jerilynn Mages.  Shick M.D.   On: 04/20/2018 10:58   US Paracentesis  Result Date: 04/06/2018 INDICATION: Metastatic breast cancer, ascites, abdominal distension EXAM: ULTRASOUND GUIDED RIGHT PARACENTESIS MEDICATIONS: 1% LIDOCAINE LOCAL COMPLICATIONS: None immediate. PROCEDURE: Informed written consent was obtained from the patient after a discussion of the risks, benefits and alternatives to treatment. A timeout was performed prior to the initiation of the procedure. Initial ultrasound scanning demonstrates a large amount of ascites within the right lower abdominal quadrant. The right lower abdomen was prepped and draped in the usual sterile fashion. 1% lidocaine with epinephrine was used for local anesthesia. Following this, a 6 Fr Safe-T-Centesis catheter was introduced. An ultrasound image was saved for documentation purposes. The paracentesis was performed. The catheter was removed and a dressing was applied. The patient tolerated the procedure well without immediate post procedural complication. FINDINGS: A total of approximately 4.2 L of PERITONEAL fluid was removed. Samples were sent to the laboratory as requested by the clinical team. IMPRESSION: Successful ultrasound-guided paracentesis yielding 4.2 liters of peritoneal fluid. Electronically Signed   By: Jerilynn Mages.  Shick M.D.   On: 04/06/2018 11:19   Dg Abd 2 Views  Result Date: 04/18/2018 CLINICAL DATA:  Initial evaluation for constipation. EXAM: ABDOMEN - 2 VIEW COMPARISON:  None. FINDINGS: Paucity of gas limits evaluation of the bowels. No evidence for obstruction. No free air on upright view. No abnormal bowel wall thickening. Moderate retained stool within the colon, most notable at the splenic flexure. No soft tissue mass or abnormal calcification. Right-sided percutaneous nephrostomy tube in place. No acute  osseous abnormality. Right basilar atelectasis noted.  Right-sided Port-A-Cath noted as well. IMPRESSION: Nonobstructive bowel gas pattern with moderate retained stool within the colon. Electronically Signed   By: Jeannine Boga M.D.   On: 04/18/2018 17:44   Ct Renal Stone Study  Result Date: 04/04/2018 CLINICAL DATA:  Metastatic breast cancer. Status post bilateral mastectomy. EXAM: CT CHEST, ABDOMEN AND PELVIS WITHOUT CONTRAST TECHNIQUE: Multidetector CT imaging of the chest, abdomen and pelvis was performed following the standard protocol without IV contrast. COMPARISON:  02/06/2018 FINDINGS: CT CHEST FINDINGS Cardiovascular: The heart size is normal. No substantial pericardial effusion. Right Port-A-Cath tip is at the SVC/RA junction. Mediastinum/Nodes: No mediastinal lymphadenopathy. There is no hilar lymphadenopathy. Esophagus appears diffusely fluid-filled. There is no axillary lymphadenopathy. Lungs/Pleura: The central tracheobronchial airways are patent. Stable appearance of apparent scarring in the right apex. Mild atelectasis in the right middle and lower lobes is new in the interval. There is a new small right pleural effusion. No suspicious nodule or mass in the left lung. Minimal atelectasis noted left lower lobe. Tiny left pleural effusion evident. Musculoskeletal: Stable similar diffuse heterogeneous bone mineralization CT ABDOMEN PELVIS FINDINGS Hepatobiliary: No focal abnormality in the liver parenchyma. High attenuation material in the gallbladder lumen may be related to stones or sludge. No intrahepatic or extrahepatic biliary dilation. Pancreas: No focal mass lesion. No dilatation of the main duct. No intraparenchymal cyst. No peripancreatic edema. Spleen: No splenomegaly. No focal mass lesion. Adrenals/Urinary Tract: Similar to the thickening of the left adrenal gland. Thickening of the right adrenal gland is also similar to prior. Bilateral percutaneous nephrostomy tubes been removed  in the interval. There is mild to moderate right hydronephrosis with mild fullness of the left intrarenal collecting system. Neither ureter is distended beyond its midpoint. Urinary bladder is decompressed. Stomach/Bowel: Stomach is nondistended. No gastric wall thickening. No evidence of outlet obstruction. Duodenum is normally positioned as is the ligament of Treitz. No small bowel wall thickening. No small bowel dilatation. The terminal ileum is normal. The appendix is not visualized, but there is no edema or inflammation in the region of the cecum. Colon is diffusely decompressed. Vascular/Lymphatic: No abdominal aortic aneurysm. No retroperitoneal lymphadenopathy. No pelvic sidewall lymphadenopathy. Reproductive: But The uterus is unremarkable. There is no adnexal mass. Other: Moderate to large volume ascites is new in the interval. There is vascular congestion/nodularity in the gastrocolic ligament and omentum suggestion of peritoneal thickening seen in the left pelvis (96/2) and posterior pelvis bilaterally (102/2). Musculoskeletal: Similar appearance of heterogeneous bone mineralization with some more densely sclerotic lesions identified throughout as examples, see L5 vertebral body (84/2) left pubic bone (112/2) and posterior left inferior pubic ramus (118/2). IMPRESSION: 1. Interval development of moderate to large volume ascites with congestion and nodularity in the gastrocolic ligament and omentum. Imaging features highly concerning for metastatic involvement. 2. Interval removal of percutaneous nephrostomy tubes with mild to moderate right hydronephrosis and mild fullness of the left intrarenal collecting system. 3. Diffuse heterogeneous bone mineralization with scattered sclerotic lesion similar to prior and compatible with metastatic disease. 4. Small right and tiny left pleural effusions. 5. Urinary bladder decompressed on today's study and not well evaluated. Electronically Signed   By: Misty Stanley M.D.   On: 04/04/2018 11:55   Ir Nephrostomy Placement Right  Result Date: 04/06/2018 INDICATION: Metastatic breast cancer, recurrent right hydronephrosis EXAM: ULTRASOUND AND FLUOROSCOPIC RIGHT NEPHROSTOMY INSERTION COMPARISON:  04/04/2018 MEDICATIONS: CIPRO 400 MG; The antibiotic was administered in an appropriate time frame prior to skin puncture. ANESTHESIA/SEDATION: Fentanyl 50 mcg  IV; Versed 2 mg IV Moderate Sedation Time:  11 minutes The patient was continuously monitored during the procedure by the interventional radiology nurse under my direct supervision. CONTRAST:  10 cc-administered into the collecting system(s) FLUOROSCOPY TIME:  Fluoroscopy Time: 0 minutes 30 seconds ( mGy). COMPLICATIONS: None immediate. PROCEDURE: Informed written consent was obtained from the patient after a thorough discussion of the procedural risks, benefits and alternatives. All questions were addressed. Maximal Sterile Barrier Technique was utilized including caps, mask, sterile gowns, sterile gloves, sterile drape, hand hygiene and skin antiseptic. A timeout was performed prior to the initiation of the procedure. Previous imaging reviewed. Patient position prone. Right hydronephrotic kidney was localized and marked. Under sterile conditions anesthesia, an 18 gauge introducer needle was advanced into a dilated mid to lower pole calyx. Needle position confirmed with ultrasound. Images obtained for documentation. There was return of urine. Guidewire inserted followed by tract dilatation to insert a 10 French nephrostomy. Retention loop formed the renal pelvis. Contrast injection confirms position. Images obtained for documentation. Catheter secured with a prolene suture and a sterile dressing. Gravity drainage bag connected. No immediate complication. Patient tolerated the procedure well. IMPRESSION: Successful ultrasound and fluoroscopic 10 French right nephrostomy insertion Electronically Signed   By: Jerilynn Mages.  Shick M.D.    On: 04/06/2018 12:05    PERFORMANCE STATUS (ECOG) : 4 - Bedbound  Review of Systems As noted above. Otherwise, a complete review of systems is negative.  Physical Exam General: ill appearing Pulmonary: unlabored GU: no suprapubic tenderness Extremities: no edema, no joint deformities Skin: no rashes Neurological: lethargic  IMPRESSION: Patient remains on comfort care.  She is currently sleeping but comfortable appearing.  No signs of distress.  She continues to receive PRN medications for comfort.  Pending hospice home transfer when bed is available.  Spoke with husband at bedside.   PLAN: 1.  Hospice Home when bed is available.  2. Comfort measures 3.  DNR  Time Total: 15 minutes  Visit consisted of counseling and education dealing with the complex and emotionally intense issues of symptom management and palliative care in the setting of serious and potentially life-threatening illness.Greater than 50%  of this time was spent counseling and coordinating care related to the above assessment and plan.  Signed by: Altha Harm, PhD, NP-C [phone] (Work Cell)

## 2018-04-27 NOTE — Discharge Summary (Signed)
[facility] [place] NAME: Mary [name]    MR#:  [id number]  DATE OF BIRTH:  15-Dec-1960  DATE OF ADMISSION:  04/18/2018 ADMITTING PHYSICIAN: Fritzi Mandes, MD  DATE OF DISCHARGE: No discharge date for patient encounter.  PRIMARY CARE PHYSICIAN: [name], MD    ADMISSION DIAGNOSIS:  Nephrostomy status ([place]) [Z93.6] Constipation [K59.00] Syncope, unspecified syncope type [R55]  DISCHARGE DIAGNOSIS:  Active Problems:   Altered mental status   Constipation   Palliative care encounter   Neoplasm related pain   Malignant ascites   Nephrostomy status (HCC)   Abdominal carcinomatosis (HCC)   Syncope   Leptomeningeal disease   SECONDARY DIAGNOSIS:   Past Medical History:  Diagnosis Date  . Anxiety    takes Xanax prn  . Breast cancer [facility]) August 2013   bilateral, er/pr+, bladder (02/2018..mets from breast ca)  . Chronic kidney disease 01/2018   recent shut down of kidneys with labs out of whack  . Complication of anesthesia    pt states she had the shakes extremely bad  . Cough    at night d/t reflux;nothing productive  . Dental crowns present    x 4  . Depression   . GERD (gastroesophageal reflux disease)    takes Nexium daily  . History of bronchitis    08/2011  . History of colon polyps   . Hot flashes, menopausal 08/08/2012  . IBS (irritable bowel syndrome)    immodium prn  . Insomnia    takes Ambien nightly  . Irritable bowel syndrome   . 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 Neutropenia, drug-induced ([place]) 12/15/2011  . Nocturia   . Plantar fasciitis   . S/P radiation therapy 07/03/2012 through 08/18/2038                                                        Right chest wall/regional lymph nodes 5040 cGy 28 sessions, right chest wall/mastectomy scar boost 1000 cGy 5 sessions                          . Status post chemotherapy    FEC 100 starting on 12/08/2011 - 01/18/12/single agent Taxol x12  weeks from 02/02/2012 through January 2014   . Urinary frequency   . Use of tamoxifen (Nolvadex)    now stopped. will be starting ibrance on 03/05/18  . Vertigo     HOSPITAL COURSE:  58 year old female patient with history of metastatic breast cancer with meningeal mets currently had a recent placement of Ommaya reservoir by neurosurgery.  Due for next chemotherapy on Friday through the reservoir  *Intermittent loss of consciousness stable Could be subtle seizures Discussed with neurology-continue Keppra for 1 month, EEG was normal, CT head negative  In discussion with the patient and the patient's husband with caregivers present-patient made herself DNR/comfort care going forward given terminal prognosis -continue expectant management with comfort care measures, patient accepted to hospice house on day of discharge   *Acute malignant ascites Resolved Related breast cancer Status post paracentesis with 3.2 L of fluid drained off April 20, 2018 Plan of care as stated above  *Chronic anxiety disorder Plan of care as stated above  *History of nephrostomy tube secondary to right-sided hydronephrosis Renal ultrasound  shows nephrostomy tube intact Plan of care as stated above  *Chronic metastatic breast cancer Oncology did see patient while in house-given approximately 3 months to live at the most  plan of care as stated above  *Acute on chronic cancer pain syndrome Plan of care as stated above  *Pleural effusions and fluid overload Stable Plan of care as stated above  DNR/comfort care going forward with expectant management, plans for hospice house status post discharge  DISCHARGE CONDITIONS:   terminal  CONSULTS OBTAINED:  Treatment Team:  Lahoma Crocker, MD  DRUG ALLERGIES:   Allergies  Allergen Reactions  . Adhesive [Tape] Other (See Comments)    Redness:Please use paper tape  . Albumin (Human) Swelling and Rash    DISCHARGE MEDICATIONS:    Allergies as of 04/27/2018      Reactions   Adhesive [tape] Other (See Comments)   Redness:Please use paper tape   Albumin (human) Swelling, Rash      Medication List    STOP taking these medications   ALPRAZolam 0.25 MG tablet Commonly known as:  XANAX   amLODipine 5 MG tablet Commonly known as:  NORVASC   CALTRATE 600+D PO   diazepam 10 MG tablet Commonly known as:  VALIUM   dronabinol 5 MG capsule Commonly known as:  MARINOL   esomeprazole 40 MG capsule Commonly known as:  NEXIUM   FASLODEX 250 MG/5ML injection Generic drug:  fulvestrant   ferrous sulfate 325 (65 FE) MG EC tablet   furosemide 20 MG tablet Commonly known as:  LASIX   goserelin 3.6 MG injection Commonly known as:  ZOLADEX   lidocaine-prilocaine cream Commonly known as:  EMLA   loperamide 2 MG tablet Commonly known as:  IMODIUM A-D   multivitamin tablet   ondansetron 4 MG tablet Commonly known as:  ZOFRAN   oxyCODONE 10 mg 12 hr tablet Commonly known as:  OXYCONTIN   oxyCODONE 5 MG immediate release tablet Commonly known as:  Oxy IR/ROXICODONE   prochlorperazine 10 MG tablet Commonly known as:  COMPAZINE   vitamin C 100 MG tablet     TAKE these medications   apixaban 2.5 MG Tabs tablet Commonly known as:  ELIQUIS Take 1 tablet (2.5 mg total) by mouth 2 (two) times daily.   HYDROmorphone 1 MG/ML injection Commonly known as:  DILAUDID Inject 0.5 mLs (0.5 mg total) into the vein every 2 (two) hours as needed for severe pain (or dyspnea).   levETIRAcetam 100 MG/ML solution Commonly known as:  KEPPRA Take 5 mLs (500 mg total) by mouth 2 (two) times daily.   LORazepam 2 MG/ML concentrated solution Commonly known as:  ATIVAN Place 0.5 mLs (1 mg total) under the tongue every 4 (four) hours as needed for anxiety, seizure, sedation or sleep (Agitation, shortness of breath).   LORazepam 2 MG/ML injection Commonly known as:  ATIVAN Inject 0.5 mLs (1 mg total) into the vein every 4  (four) hours as needed for anxiety, seizure or sedation.   naloxegol oxalate 25 MG Tabs tablet Commonly known as:  MOVANTIK Take 1 tablet (25 mg total) by mouth daily.   ondansetron 4 MG disintegrating tablet Commonly known as:  ZOFRAN-ODT Take 1 tablet (4 mg total) by mouth every 6 (six) hours as needed for nausea.   scopolamine 1 MG/3DAYS Commonly known as:  TRANSDERM-SCOP Place 1 patch (1.5 mg total) onto the skin every 3 (three) days.   senna 8.6 MG Tabs tablet Commonly known as:  SENOKOT Take 2 tablets (  17.2 mg total) by mouth daily.            Durable Medical Equipment  (From admission, onward)         Start     Ordered   04/20/18 1338  For home use only DME Hospital bed  Once    Question Answer Comment  The above medical condition requires: Patient requires the ability to reposition frequently   Head must be elevated greater than: 45 degrees   Bed type Semi-electric      04/20/18 1338           DISCHARGE INSTRUCTIONS:      If you experience worsening of your admission symptoms, develop shortness of breath, life threatening emergency, suicidal or homicidal thoughts you must seek medical attention immediately by calling 911 or calling your MD immediately  if symptoms less severe.  You Must read complete instructions/literature along with all the possible adverse reactions/side effects for all the Medicines you take and that have been prescribed to you. Take any new Medicines after you have completely understood and accept all the possible adverse reactions/side effects.   Please note  You were cared for by a hospitalist during your hospital stay. If you have any questions about your discharge medications or the care you received while you were in the hospital after you are discharged, you can call the unit and asked to speak with the hospitalist on call if the hospitalist that took care of you is not available. Once you are discharged, your primary care  physician will handle any further medical issues. Please note that NO REFILLS for any discharge medications will be authorized once you are discharged, as it is imperative that you return to your primary care physician (or establish a relationship with a primary care physician if you do not have one) for your aftercare needs so that they can reassess your need for medications and monitor your lab values.    Today   CHIEF COMPLAINT:   Chief Complaint  Patient presents with  . Loss of Consciousness    HISTORY OF PRESENT ILLNESS:   58 y.o. female with a known history of metastatic breast cancer with little meningeal meds recently had placement of Ommaya reservoir [place] neurosurgery April 13, 2017. Patient will get her next chemo on Friday through that reservoir . She follows with Dr. Tasia Catchings the cancer center. Patient has been having episodes of blanking out spells with intermittent loss of consciousness. She is not had any fall. She does not walk. She has caregivers at home according to the husband in the room. CT head was done which is essentially stable from previous CT scan. ER physician spoke with neurosurgery at [facility] and they recommend patient be admitted for rule out possible seizure if CT head is negative.  No Witnessed seizures. Patient has not had EEG. She is currently awake alert and conversation with me.  Patient is being admitted for further evaluation of management.   VITAL SIGNS:  Blood pressure (!) 141/86, pulse 88, temperature 98.2 F (36.8 C), temperature source Oral, resp. rate 18, height 5\' 5"  (1.651 m), weight 58.2 kg, last menstrual period 11/28/2011, SpO2 95 %.  I/O:    Intake/Output Summary (Last 24 hours) at 04/27/2018 1053 Last data filed at 04/27/2018 0452 Gross per 24 hour  Intake -  Output 400 ml  Net -400 ml    PHYSICAL EXAMINATION:  GENERAL:  58 y.o.-year-old patient lying in the bed with no acute distress.  EYES:  Pupils equal, round, reactive to light and  accommodation. No scleral icterus. Extraocular muscles intact.  HEENT: Head atraumatic, normocephalic. Oropharynx and nasopharynx clear.  NECK:  Supple, no jugular venous distention. No thyroid enlargement, no tenderness.  LUNGS: Normal breath sounds bilaterally, no wheezing, rales,rhonchi or crepitation. No use of accessory muscles of respiration.  CARDIOVASCULAR: S1, S2 normal. No murmurs, rubs, or gallops.  ABDOMEN: Soft, non-tender, non-distended. Bowel sounds present. No organomegaly or mass.  EXTREMITIES: No pedal edema, cyanosis, or clubbing.  NEUROLOGIC: Cranial nerves II through XII are intact. Muscle strength 5/5 in all extremities. Sensation intact. Gait not checked.  PSYCHIATRIC: The patient is alert and oriented x 3.  SKIN: No obvious rash, lesion, or ulcer.   DATA REVIEW:   CBC Recent Labs  Lab 04/21/18 0543  WBC 7.1  HGB 10.7*  HCT 32.6*  PLT 425*    Chemistries  Recent Labs  Lab 04/21/18 0543  NA 134*  K 4.6  CL 96*  CO2 30  GLUCOSE 150*  BUN 18  CREATININE 0.81  CALCIUM 8.4*    Cardiac Enzymes No results for input(s): TROPONINI in the last 168 hours.  Microbiology Results  Results for orders placed or performed during the hospital encounter of 03/01/18  CSF culture     Status: None   Collection Time: 03/01/18 12:51 PM  Result Value Ref Range Status   Specimen Description   Final    CSF Performed at [facility], [address] [place], [place] [address]    Special Requests   Final    NONE Performed at [facility], [address] [place], [place] [address]    Gram Stain   Final    NO ORGANISMS SEEN WBC SEEN NO RBC SEEN Performed at [facility], [address], [place], [place] [address]    Culture   Final    NO GROWTH 3 DAYS Performed at [facility], [place] [address] [place], [place] [address]    Report Status 03/05/2018 FINAL  Final    RADIOLOGY:  No results found.  EKG:   Orders placed or  performed during the hospital encounter of 04/18/18  . ED EKG  . ED EKG      Management plans discussed with the patient, family and they are in agreement.  CODE STATUS:     Code Status Orders  (From admission, onward)         Start     Ordered   04/21/18 1024  Do not attempt resuscitation (DNR)  Continuous    Question Answer Comment  In the event of cardiac or respiratory ARREST Do not call a "code blue"   In the event of cardiac or respiratory ARREST Do not perform Intubation, CPR, defibrillation or ACLS   In the event of cardiac or respiratory ARREST Use medication by any route, position, wound care, and other measures to relive pain and suffering. May use oxygen, suction and manual treatment of airway obstruction as needed for comfort.   Comments Nurse to pronounce      04/21/18 1028        Code Status History    Date Active Date Inactive Code Status Order ID Comments User Context   04/21/2018 9381 04/21/2018 1028 DNR [id number]  Gorden Harms, MD Inpatient   04/18/2018 2227 04/21/2018 1023 Full Code [id number]  Fritzi Mandes, MD Inpatient   04/06/2018 1803 04/07/2018 1524 DNR [id number]  Demetrios Loll, MD Inpatient   12/16/2017 1247 12/22/2017  2036 Full Code [id number]  Arta Silence, MD Inpatient   05/18/2012 1813 05/20/2012 1306 Full Code [id number]  Adin Hector, MD Inpatient    Advance Directive Documentation     Most Recent Value  Type of Advance Directive  Healthcare Power of [place], Living will  Pre-existing out of facility DNR order (yellow form or pink MOST form)  -  "MOST" Form in Place?  -      TOTAL TIME TAKING CARE OF THIS PATIENT: 40 minutes.    Avel Peace  M.D on 04/27/2018 at 10:53 AM  Between 7am to 6pm - Pager - [phone]  After 6pm go to [email or web address] - password [email or web address] [facility]  Office  [phone]  CC: Primary care physician; [name], MD   Note: This dictation was prepared with Dragon dictation along  with smaller phrase technology. Any transcriptional errors that result from this process are unintentional.

## 2018-04-27 NOTE — Progress Notes (Signed)
Ch f/u w/ pt but the nurse shared that pt had been transferred to hospice. Nurse appreciated the f/u but no further needs at this time.    04/27/18 1325  Clinical Encounter Type  Visited With Health care provider  Visit Type Follow-up  Referral From Nurse  Consult/Referral To Chaplain  Spiritual Encounters  Spiritual Needs Other (Comment) (living will)  Stress Factors  Patient Stress Factors None identified  Family Stress Factors None identified

## 2018-04-27 NOTE — Progress Notes (Signed)
Follow up visit made to new hospice home referral. Patient appeared to be sleeping soundly, husband Jenny Reichmann and caregiver Hope present. Writer informed Jenny Reichmann that there is a bed available at the hospice home for transfer today. John remains in agreement. Hospital care team updated. Report called to the hospice home, EMS notified for transport. Discharge summary faxed to referral. Discussed premedication prior to transport with staff RN Stanton Kidney, family I agreement. Flo Shanks RN, BSN, [place] and Palliative of [facility]ron[facility], hospital Liaison [phone]

## 2018-04-27 NOTE — Progress Notes (Signed)
Ch responded to pg from IC. Ch spoke with Network engineer and nurse of pt regarding finalizing a will for pt who is declining. Ch spoke directly w. Pt family member to educated them on the process of finalizing the will. Pt understood and stated that the pt was resting now but pt is mentally capable of signing document which was confirmed by nurse of pt. Ch stated that once pt was awoke, pg for f/u and a notary w/ witnesses would be gathered.    04/27/18 1000  Clinical Encounter Type  Visited With Family;Health care provider  Visit Type Patient actively dying;Social support  Referral From Nurse  Consult/Referral To Chaplain  Recommendations f/u for a living will finalized   Spiritual Encounters  Spiritual Needs Other (Comment) (living will notarized )  Stress Factors  Patient Stress Factors None identified  Family Stress Factors None identified

## 2018-04-28 DIAGNOSIS — J90 Pleural effusion, not elsewhere classified: Secondary | ICD-10-CM | POA: Diagnosis not present

## 2018-04-28 DIAGNOSIS — C7981 Secondary malignant neoplasm of breast: Secondary | ICD-10-CM | POA: Diagnosis not present

## 2018-04-28 DIAGNOSIS — C7989 Secondary malignant neoplasm of other specified sites: Secondary | ICD-10-CM | POA: Diagnosis not present

## 2018-04-28 DIAGNOSIS — C50911 Malignant neoplasm of unspecified site of right female breast: Secondary | ICD-10-CM | POA: Diagnosis not present

## 2018-04-28 DIAGNOSIS — R55 Syncope and collapse: Secondary | ICD-10-CM | POA: Diagnosis not present

## 2018-04-28 DIAGNOSIS — C800 Disseminated malignant neoplasm, unspecified: Secondary | ICD-10-CM | POA: Diagnosis not present

## 2018-04-28 DIAGNOSIS — C7951 Secondary malignant neoplasm of bone: Secondary | ICD-10-CM | POA: Diagnosis not present

## 2018-04-28 DIAGNOSIS — R180 Malignant ascites: Secondary | ICD-10-CM | POA: Diagnosis not present

## 2018-04-28 DIAGNOSIS — C7911 Secondary malignant neoplasm of bladder: Secondary | ICD-10-CM | POA: Diagnosis not present

## 2018-04-29 DIAGNOSIS — J90 Pleural effusion, not elsewhere classified: Secondary | ICD-10-CM | POA: Diagnosis not present

## 2018-04-29 DIAGNOSIS — R55 Syncope and collapse: Secondary | ICD-10-CM | POA: Diagnosis not present

## 2018-04-29 DIAGNOSIS — C7911 Secondary malignant neoplasm of bladder: Secondary | ICD-10-CM | POA: Diagnosis not present

## 2018-04-29 DIAGNOSIS — C800 Disseminated malignant neoplasm, unspecified: Secondary | ICD-10-CM | POA: Diagnosis not present

## 2018-04-29 DIAGNOSIS — C50911 Malignant neoplasm of unspecified site of right female breast: Secondary | ICD-10-CM | POA: Diagnosis not present

## 2018-04-29 DIAGNOSIS — C7989 Secondary malignant neoplasm of other specified sites: Secondary | ICD-10-CM | POA: Diagnosis not present

## 2018-04-29 DIAGNOSIS — C7981 Secondary malignant neoplasm of breast: Secondary | ICD-10-CM | POA: Diagnosis not present

## 2018-04-29 DIAGNOSIS — C7951 Secondary malignant neoplasm of bone: Secondary | ICD-10-CM | POA: Diagnosis not present

## 2018-04-29 DIAGNOSIS — R180 Malignant ascites: Secondary | ICD-10-CM | POA: Diagnosis not present

## 2018-04-30 DIAGNOSIS — R55 Syncope and collapse: Secondary | ICD-10-CM | POA: Diagnosis not present

## 2018-04-30 DIAGNOSIS — C50911 Malignant neoplasm of unspecified site of right female breast: Secondary | ICD-10-CM | POA: Diagnosis not present

## 2018-04-30 DIAGNOSIS — R180 Malignant ascites: Secondary | ICD-10-CM | POA: Diagnosis not present

## 2018-04-30 DIAGNOSIS — C800 Disseminated malignant neoplasm, unspecified: Secondary | ICD-10-CM | POA: Diagnosis not present

## 2018-04-30 DIAGNOSIS — C7951 Secondary malignant neoplasm of bone: Secondary | ICD-10-CM | POA: Diagnosis not present

## 2018-04-30 DIAGNOSIS — C7989 Secondary malignant neoplasm of other specified sites: Secondary | ICD-10-CM | POA: Diagnosis not present

## 2018-04-30 DIAGNOSIS — J90 Pleural effusion, not elsewhere classified: Secondary | ICD-10-CM | POA: Diagnosis not present

## 2018-04-30 DIAGNOSIS — C7981 Secondary malignant neoplasm of breast: Secondary | ICD-10-CM | POA: Diagnosis not present

## 2018-04-30 DIAGNOSIS — C7911 Secondary malignant neoplasm of bladder: Secondary | ICD-10-CM | POA: Diagnosis not present

## 2018-05-01 ENCOUNTER — Other Ambulatory Visit: Payer: Self-pay | Admitting: *Deleted

## 2018-05-01 DIAGNOSIS — R55 Syncope and collapse: Secondary | ICD-10-CM | POA: Diagnosis not present

## 2018-05-01 DIAGNOSIS — C7989 Secondary malignant neoplasm of other specified sites: Secondary | ICD-10-CM | POA: Diagnosis not present

## 2018-05-01 DIAGNOSIS — C800 Disseminated malignant neoplasm, unspecified: Secondary | ICD-10-CM | POA: Diagnosis not present

## 2018-05-01 DIAGNOSIS — C7911 Secondary malignant neoplasm of bladder: Secondary | ICD-10-CM | POA: Diagnosis not present

## 2018-05-01 DIAGNOSIS — R180 Malignant ascites: Secondary | ICD-10-CM | POA: Diagnosis not present

## 2018-05-01 DIAGNOSIS — J90 Pleural effusion, not elsewhere classified: Secondary | ICD-10-CM | POA: Diagnosis not present

## 2018-05-01 DIAGNOSIS — C7981 Secondary malignant neoplasm of breast: Secondary | ICD-10-CM | POA: Diagnosis not present

## 2018-05-01 DIAGNOSIS — C50911 Malignant neoplasm of unspecified site of right female breast: Secondary | ICD-10-CM | POA: Diagnosis not present

## 2018-05-01 DIAGNOSIS — C7951 Secondary malignant neoplasm of bone: Secondary | ICD-10-CM | POA: Diagnosis not present

## 2018-05-01 NOTE — Patient Outreach (Signed)
[facility]) Care Management  05/01/2018  Mary [name] 08/16/1960 [id number]   Received referral on 04/19/18 after patient was admitted to [facility] on 1/04/29/18 for syncopal episodes. [name] was transferred from [facility] to a residential Hospice facility in [place]  on 04/27/18 for end of life care.  Will make patient inactive with [facility] Management services.  Barrington Ellison RN,CCM,CDE [place] Management Coordinator Office Phone [phone] Office Fax [phone]

## 2018-05-02 DIAGNOSIS — C7951 Secondary malignant neoplasm of bone: Secondary | ICD-10-CM | POA: Diagnosis not present

## 2018-05-02 DIAGNOSIS — C800 Disseminated malignant neoplasm, unspecified: Secondary | ICD-10-CM | POA: Diagnosis not present

## 2018-05-02 DIAGNOSIS — R55 Syncope and collapse: Secondary | ICD-10-CM | POA: Diagnosis not present

## 2018-05-02 DIAGNOSIS — J90 Pleural effusion, not elsewhere classified: Secondary | ICD-10-CM | POA: Diagnosis not present

## 2018-05-02 DIAGNOSIS — C7981 Secondary malignant neoplasm of breast: Secondary | ICD-10-CM | POA: Diagnosis not present

## 2018-05-02 DIAGNOSIS — C50911 Malignant neoplasm of unspecified site of right female breast: Secondary | ICD-10-CM | POA: Diagnosis not present

## 2018-05-02 DIAGNOSIS — C7911 Secondary malignant neoplasm of bladder: Secondary | ICD-10-CM | POA: Diagnosis not present

## 2018-05-02 DIAGNOSIS — R180 Malignant ascites: Secondary | ICD-10-CM | POA: Diagnosis not present

## 2018-05-02 DIAGNOSIS — C7989 Secondary malignant neoplasm of other specified sites: Secondary | ICD-10-CM | POA: Diagnosis not present

## 2018-05-03 ENCOUNTER — Other Ambulatory Visit: Payer: Self-pay | Admitting: *Deleted

## 2018-05-03 ENCOUNTER — Encounter: Payer: Self-pay | Admitting: Hematology and Oncology

## 2018-05-03 DIAGNOSIS — C50911 Malignant neoplasm of unspecified site of right female breast: Secondary | ICD-10-CM | POA: Diagnosis not present

## 2018-05-03 DIAGNOSIS — C7911 Secondary malignant neoplasm of bladder: Secondary | ICD-10-CM | POA: Diagnosis not present

## 2018-05-03 DIAGNOSIS — R55 Syncope and collapse: Secondary | ICD-10-CM | POA: Diagnosis not present

## 2018-05-03 DIAGNOSIS — J90 Pleural effusion, not elsewhere classified: Secondary | ICD-10-CM | POA: Diagnosis not present

## 2018-05-03 DIAGNOSIS — C7951 Secondary malignant neoplasm of bone: Secondary | ICD-10-CM | POA: Diagnosis not present

## 2018-05-03 DIAGNOSIS — C800 Disseminated malignant neoplasm, unspecified: Secondary | ICD-10-CM | POA: Diagnosis not present

## 2018-05-03 DIAGNOSIS — C7989 Secondary malignant neoplasm of other specified sites: Secondary | ICD-10-CM | POA: Diagnosis not present

## 2018-05-03 DIAGNOSIS — R180 Malignant ascites: Secondary | ICD-10-CM | POA: Diagnosis not present

## 2018-05-03 DIAGNOSIS — C7981 Secondary malignant neoplasm of breast: Secondary | ICD-10-CM | POA: Diagnosis not present

## 2018-05-04 ENCOUNTER — Encounter: Payer: Self-pay | Admitting: Oncology

## 2018-05-07 ENCOUNTER — Encounter: Payer: Self-pay | Admitting: Oncology

## 2018-05-18 ENCOUNTER — Ambulatory Visit: Payer: 59

## 2018-05-20 NOTE — Patient Outreach (Signed)
[facility]) Care Management  05/26/2018  JESICA GOHEEN 1961/01/20 [id number]   Provided update to [facility] RNCM's during monthly [facility] Case Management review to include Mrs. Marey's recent 2 hospitalizations and transfer to residential Hospice home in [place] on 04/27/18 for end of life care.  Barrington Ellison RN,CCM,CDE [place] Management Coordinator Office Phone [phone] Office Fax [phone]

## 2018-05-20 DEATH — deceased

## 2018-05-25 ENCOUNTER — Ambulatory Visit: Payer: 59

## 2018-06-15 ENCOUNTER — Ambulatory Visit: Payer: 59

## 2018-06-22 ENCOUNTER — Ambulatory Visit: Payer: 59

## 2018-07-20 ENCOUNTER — Ambulatory Visit: Payer: 59

## 2018-07-20 ENCOUNTER — Ambulatory Visit: Payer: 59 | Admitting: Hematology and Oncology

## 2018-08-17 ENCOUNTER — Ambulatory Visit: Payer: 59 | Admitting: Hematology and Oncology

## 2018-08-17 ENCOUNTER — Ambulatory Visit: Payer: 59

## 2020-01-16 IMAGING — MR MR HEAD WO/W CM
13 series · 48 of 48 positions shown · IV contrast (multihance)
Comparison: Brain MRI and intracranial MRA 08/10/2017

CLINICAL DATA: 57-year-old female with history of metastatic breast
cancer. Dizziness.

EXAM:
MRI HEAD WITHOUT AND WITH CONTRAST
TECHNIQUE: Multiplanar, multiecho pulse sequences of the brain and surrounding
structures were obtained without and with intravenous contrast.
CONTRAST:  15mL MULTIHANCE GADOBENATE DIMEGLUMINE 529 MG/ML IV SOLN

[Series 4: DWI · axial · 3.0mm · 1.20mm/px · z∈[-70,+90]mm · 4 of 55 slices shown (1 of 4)]
[im 1/55]
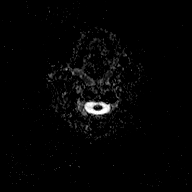
[im 19/55]
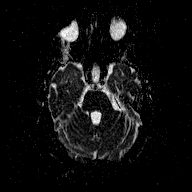
[im 37/55]
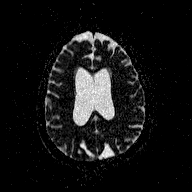
[im 55/55]
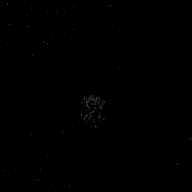

[Series 6: DWI · coronal · 3.0mm · 1.15mm/px · 3 of 49 slices shown (2 of 4)]
[im 1/49]
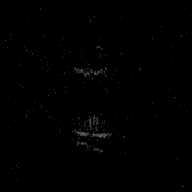
[im 25/49]
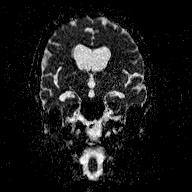
[im 49/49]
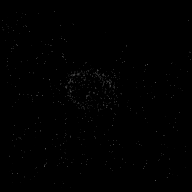

[Series 7: T1 · sagittal · 5.0mm · 0.45mm/px · 1 of 23 slices shown (1 of 2)]
[im 1/23]
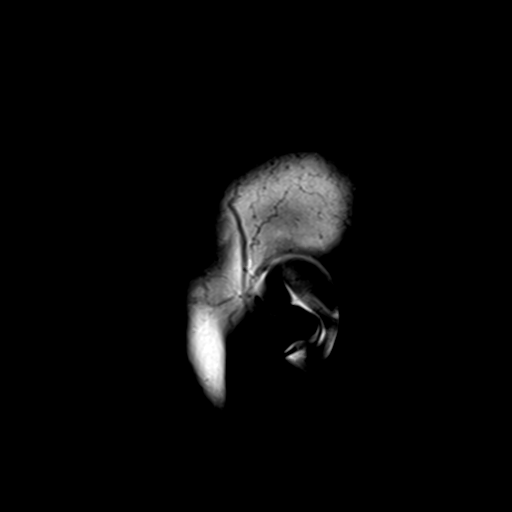

[Series 8: T2 · axial · 5.0mm · 0.72mm/px · z∈[-72,+94]mm · 2 of 25 slices shown (1 of 2)]
[im 1/25]
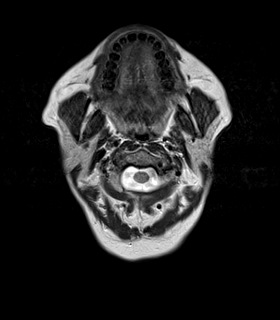
[im 25/25]
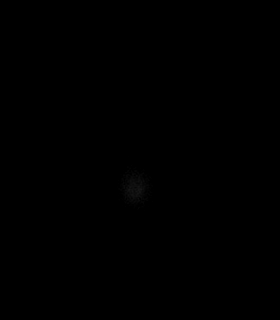

[Series 9: FLAIR · axial · 3.0mm · 0.45mm/px · z∈[-69,+91]mm · 4 of 55 slices shown]
[im 1/55]
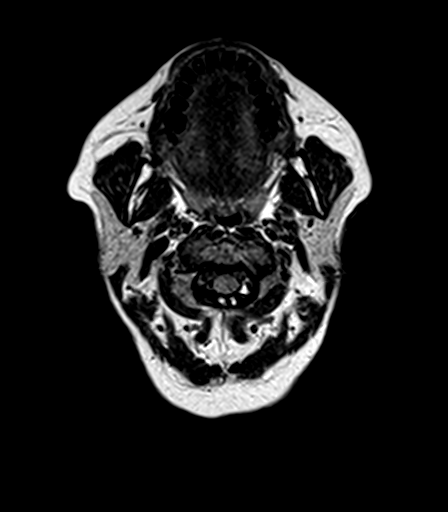
[im 19/55]
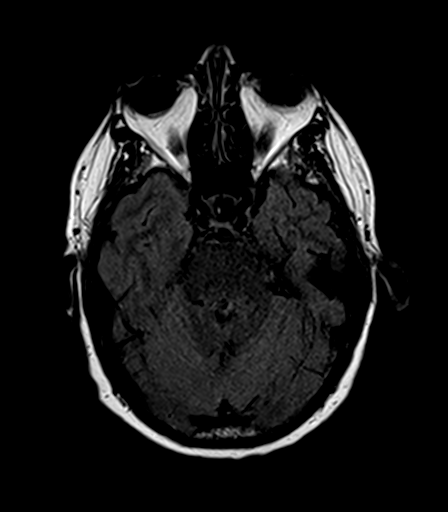
[im 37/55]
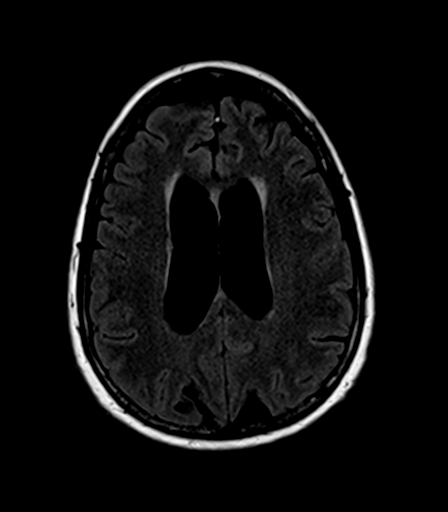
[im 55/55]
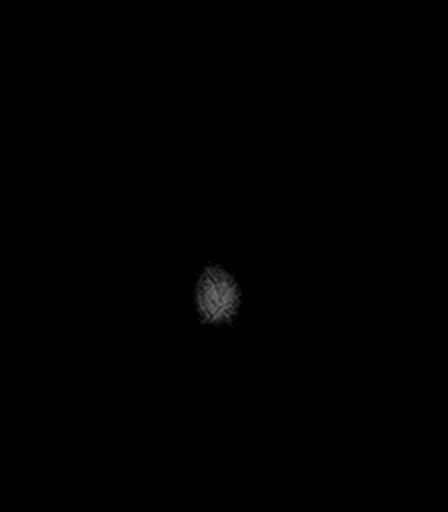

[Series 10: T2 · axial · 5.0mm · 0.72mm/px · z∈[-72,+94]mm · 2 of 25 slices shown (2 of 2)]
[im 1/25]
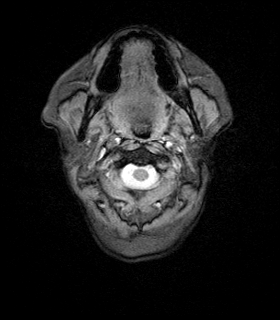
[im 25/25]
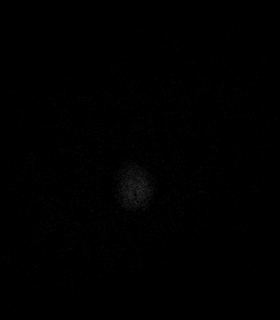

[Series 11: T1 · axial · 1.0mm · 1.00mm/px · z∈[-70,+87]mm · 10 of 160 slices shown (2 of 2)]
[im 1/160]
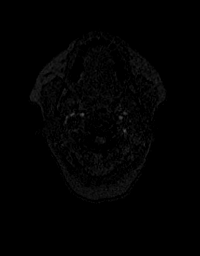
[im 18/160]
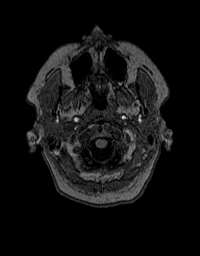
[im 36/160]
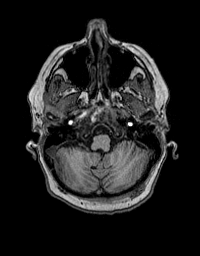
[im 54/160]
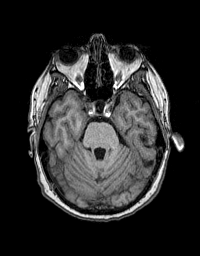
[im 71/160]
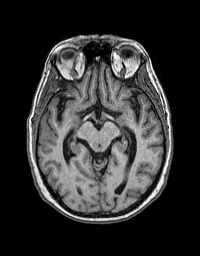
[im 89/160]
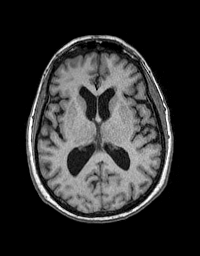
[im 107/160]
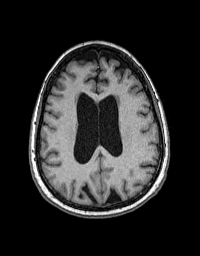
[im 124/160]
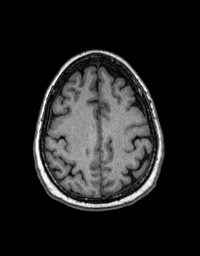
[im 142/160]
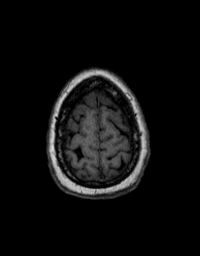
[im 160/160]
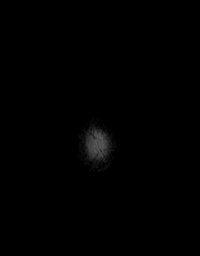

[Series 12: T2 post-contrast · coronal · 5.0mm · 0.43mm/px · 2 of 30 slices shown]
[im 1/30]
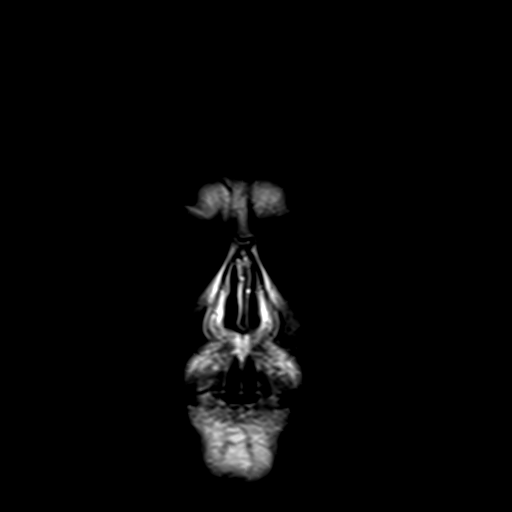
[im 30/30]
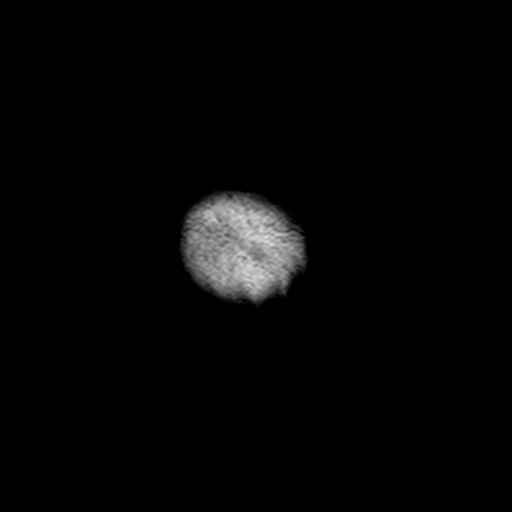

[Series 13: T1 post-contrast · axial · 1.0mm · 1.00mm/px · z∈[-70,+87]mm · 10 of 160 slices shown (1 of 3)]
[im 1/160]
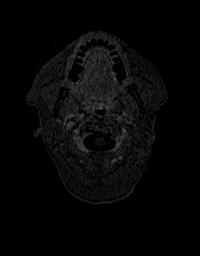
[im 18/160]
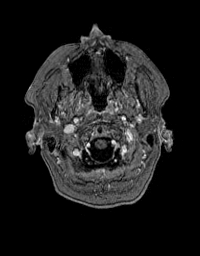
[im 36/160]
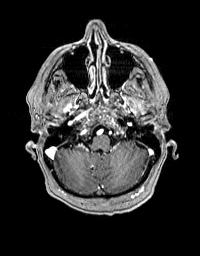
[im 54/160]
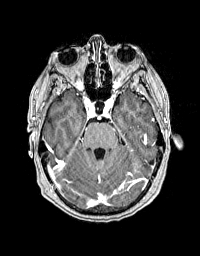
[im 71/160]
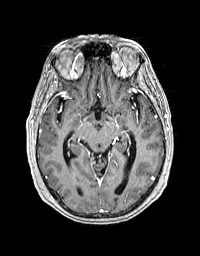
[im 89/160]
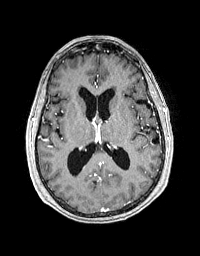
[im 107/160]
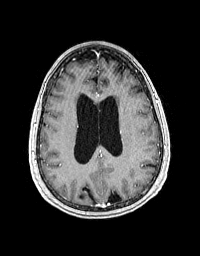
[im 124/160]
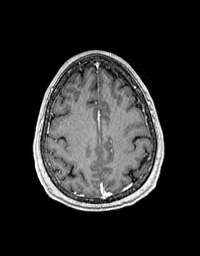
[im 142/160]
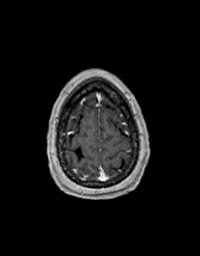
[im 160/160]
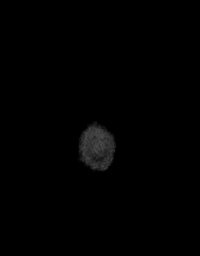

[Series 14: T1 post-contrast · coronal · 5.0mm · 0.43mm/px · 2 of 30 slices shown (2 of 3)]
[im 1/30]
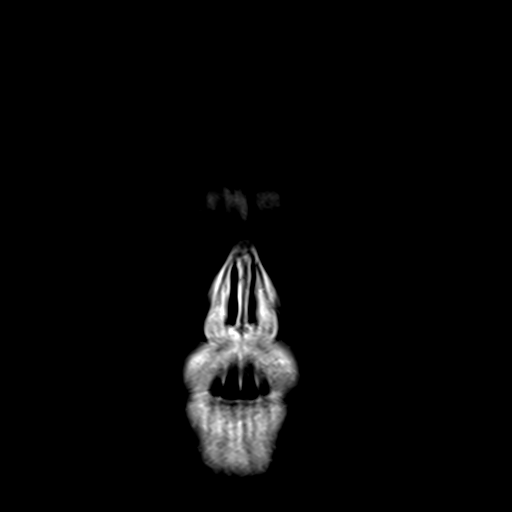
[im 30/30]
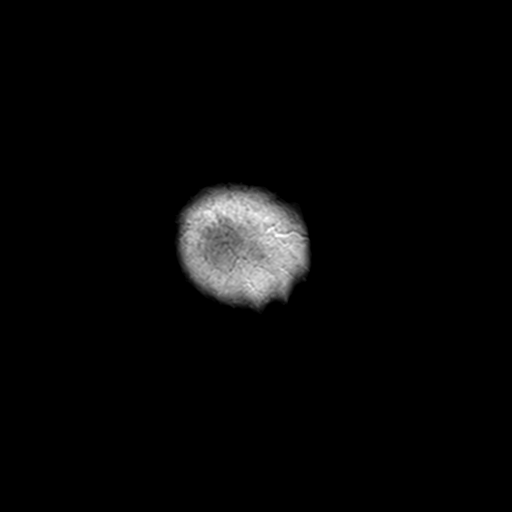

[Series 15: T1 post-contrast · sagittal · 5.0mm · 0.45mm/px · 1 of 23 slices shown (3 of 3)]
[im 1/23]
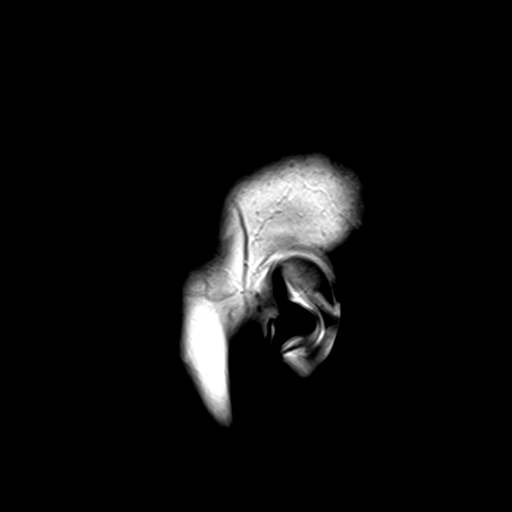

[Series 100: DWI · axial · 3.0mm · 1.20mm/px · z∈[-70,+90]mm · 4 of 55 slices shown (3 of 4)]
[im 1/55]
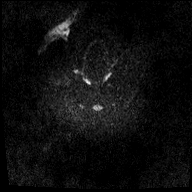
[im 19/55]
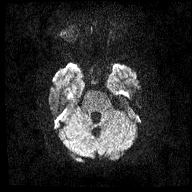
[im 37/55]
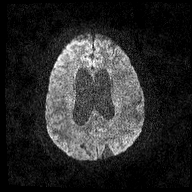
[im 55/55]
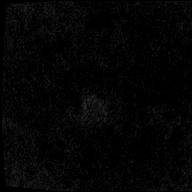

[Series 101: DWI · coronal · 3.0mm · 1.15mm/px · 3 of 49 slices shown (4 of 4)]
[im 1/49]
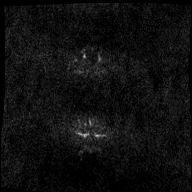
[im 25/49]
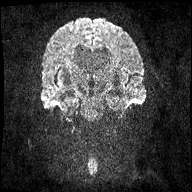
[im 49/49]
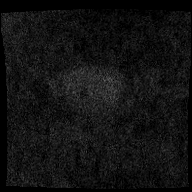

[48 of 48 positions shown; findings below may reference images not displayed]

FINDINGS: Brain: No abnormal enhancement identified (developmental venous
anomaly of the cerebellar vermis, see below). No midline shift, mass
effect, or evidence of intracranial mass lesion. No dural
thickening.

Stable cerebral volume. Chronic partially empty sella. Stable
ventricular prominence. No transependymal edema. No restricted
diffusion to suggest acute infarction. No extra-axial collection or
acute intracranial hemorrhage. Cervicomedullary junction within
normal limits. Gray and white matter signal remains normal for age
aside from chronic cavernous venous vascular malformation and
associated developmental venous anomaly of the cerebellar vermis
(series 12, image 9, series 10, image 9) which is stable since 4238.
No other chronic cerebral blood products.

Vascular: Major intracranial vascular flow voids are stable. The
major dural venous sinuses are enhancing and appear patent.

Skull and upper cervical spine: Heterogeneous loss of the normal T1
bone marrow signal in the skull and cervical spine since 4238. No
destructive osseous lesion is identified. Negative visible cervical
spinal cord.

Sinuses/Orbits: Stable and negative.

Other: Mastoid air cells are clear. Visible internal auditory
structures appear normal. Scalp and face soft tissues appear
negative.
IMPRESSION: 1. Heterogeneous decreased bone marrow signal since 4238. This could
be osseous metastatic disease, but red marrow reactivation such as
in the setting of chemotherapy could also have this appearance.
2. Stable MRI appearance of the brain with no cerebral metastatic
disease or acute intracranial abnormality identified.
3. Chronic cerebellar vermis cavernous venous malformation and
developmental venous anomaly.
